# Patient Record
Sex: Female | Born: 1935 | Race: White | Hispanic: No | Marital: Married | Smoking: Never smoker
Health system: Southern US, Community
[De-identification: ages and names within clinical notes are randomized; demographics above are authoritative.]

## PROBLEM LIST (undated history)

## (undated) DIAGNOSIS — K635 Polyp of colon: Secondary | ICD-10-CM

## (undated) DIAGNOSIS — E13319 Other specified diabetes mellitus with unspecified diabetic retinopathy without macular edema: Secondary | ICD-10-CM

## (undated) DIAGNOSIS — E785 Hyperlipidemia, unspecified: Secondary | ICD-10-CM

## (undated) DIAGNOSIS — E049 Nontoxic goiter, unspecified: Secondary | ICD-10-CM

## (undated) DIAGNOSIS — D649 Anemia, unspecified: Secondary | ICD-10-CM

## (undated) DIAGNOSIS — N200 Calculus of kidney: Secondary | ICD-10-CM

## (undated) DIAGNOSIS — E669 Obesity, unspecified: Secondary | ICD-10-CM

## (undated) DIAGNOSIS — J189 Pneumonia, unspecified organism: Secondary | ICD-10-CM

## (undated) DIAGNOSIS — R0602 Shortness of breath: Secondary | ICD-10-CM

## (undated) DIAGNOSIS — L02219 Cutaneous abscess of trunk, unspecified: Secondary | ICD-10-CM

## (undated) DIAGNOSIS — S31109A Unspecified open wound of abdominal wall, unspecified quadrant without penetration into peritoneal cavity, initial encounter: Secondary | ICD-10-CM

## (undated) DIAGNOSIS — K219 Gastro-esophageal reflux disease without esophagitis: Secondary | ICD-10-CM

## (undated) DIAGNOSIS — D3500 Benign neoplasm of unspecified adrenal gland: Secondary | ICD-10-CM

## (undated) DIAGNOSIS — K469 Unspecified abdominal hernia without obstruction or gangrene: Secondary | ICD-10-CM

## (undated) DIAGNOSIS — M109 Gout, unspecified: Secondary | ICD-10-CM

## (undated) DIAGNOSIS — L03319 Cellulitis of trunk, unspecified: Secondary | ICD-10-CM

## (undated) DIAGNOSIS — E1321 Other specified diabetes mellitus with diabetic nephropathy: Secondary | ICD-10-CM

## (undated) DIAGNOSIS — M1990 Unspecified osteoarthritis, unspecified site: Secondary | ICD-10-CM

## (undated) DIAGNOSIS — I10 Essential (primary) hypertension: Secondary | ICD-10-CM

## (undated) DIAGNOSIS — E119 Type 2 diabetes mellitus without complications: Secondary | ICD-10-CM

## (undated) DIAGNOSIS — K759 Inflammatory liver disease, unspecified: Secondary | ICD-10-CM

## (undated) DIAGNOSIS — K5792 Diverticulitis of intestine, part unspecified, without perforation or abscess without bleeding: Secondary | ICD-10-CM

## (undated) HISTORY — DX: Nontoxic goiter, unspecified: E04.9

## (undated) HISTORY — DX: Other specified diabetes mellitus with diabetic nephropathy: E13.21

## (undated) HISTORY — PX: TOTAL ABDOMINAL HYSTERECTOMY: SHX209

## (undated) HISTORY — DX: Gout, unspecified: M10.9

## (undated) HISTORY — PX: CHOLECYSTECTOMY: SHX55

## (undated) HISTORY — DX: Polyp of colon: K63.5

## (undated) HISTORY — PX: ROTATOR CUFF REPAIR: SHX139

## (undated) HISTORY — DX: Obesity, unspecified: E66.9

## (undated) HISTORY — DX: Anemia, unspecified: D64.9

## (undated) HISTORY — PX: TONSILLECTOMY: SUR1361

## (undated) HISTORY — DX: Cutaneous abscess of trunk, unspecified: L02.219

## (undated) HISTORY — PX: SIGMOID RESECTION / RECTOPEXY: SUR1294

## (undated) HISTORY — DX: Type 2 diabetes mellitus without complications: E11.9

## (undated) HISTORY — DX: Hyperlipidemia, unspecified: E78.5

## (undated) HISTORY — DX: Essential (primary) hypertension: I10

## (undated) HISTORY — PX: APPENDECTOMY: SHX54

## (undated) HISTORY — DX: Benign neoplasm of unspecified adrenal gland: D35.00

## (undated) HISTORY — DX: Other specified diabetes mellitus with unspecified diabetic retinopathy without macular edema: E13.319

## (undated) HISTORY — DX: Unspecified open wound of abdominal wall, unspecified quadrant without penetration into peritoneal cavity, initial encounter: S31.109A

## (undated) HISTORY — DX: Calculus of kidney: N20.0

## (undated) HISTORY — DX: Unspecified abdominal hernia without obstruction or gangrene: K46.9

## (undated) HISTORY — DX: Diverticulitis of intestine, part unspecified, without perforation or abscess without bleeding: K57.92

## (undated) HISTORY — DX: Cellulitis of trunk, unspecified: L03.319

---

## 1997-05-12 ENCOUNTER — Encounter: Admission: RE | Admit: 1997-05-12 | Discharge: 1997-08-10 | Payer: Self-pay | Admitting: Endocrinology

## 1998-03-30 ENCOUNTER — Emergency Department (HOSPITAL_COMMUNITY): Admission: EM | Admit: 1998-03-30 | Discharge: 1998-03-30 | Payer: Self-pay | Admitting: Emergency Medicine

## 1998-03-30 ENCOUNTER — Encounter: Payer: Self-pay | Admitting: Emergency Medicine

## 1999-02-05 ENCOUNTER — Inpatient Hospital Stay (HOSPITAL_COMMUNITY): Admission: EM | Admit: 1999-02-05 | Discharge: 1999-02-11 | Payer: Self-pay | Admitting: Emergency Medicine

## 1999-02-07 ENCOUNTER — Encounter: Payer: Self-pay | Admitting: Internal Medicine

## 1999-02-09 ENCOUNTER — Encounter: Payer: Self-pay | Admitting: Internal Medicine

## 1999-02-28 ENCOUNTER — Observation Stay (HOSPITAL_COMMUNITY): Admission: RE | Admit: 1999-02-28 | Discharge: 1999-03-01 | Payer: Self-pay

## 1999-02-28 ENCOUNTER — Encounter (INDEPENDENT_AMBULATORY_CARE_PROVIDER_SITE_OTHER): Payer: Self-pay

## 1999-05-29 ENCOUNTER — Emergency Department (HOSPITAL_COMMUNITY): Admission: EM | Admit: 1999-05-29 | Discharge: 1999-05-29 | Payer: Self-pay | Admitting: Emergency Medicine

## 1999-05-29 ENCOUNTER — Encounter: Payer: Self-pay | Admitting: Emergency Medicine

## 1999-05-30 ENCOUNTER — Encounter: Payer: Self-pay | Admitting: Emergency Medicine

## 1999-12-26 ENCOUNTER — Ambulatory Visit (HOSPITAL_COMMUNITY): Admission: RE | Admit: 1999-12-26 | Discharge: 1999-12-26 | Payer: Self-pay | Admitting: Family Medicine

## 2000-01-18 ENCOUNTER — Encounter: Admission: RE | Admit: 2000-01-18 | Discharge: 2000-01-18 | Payer: Self-pay | Admitting: Orthopaedic Surgery

## 2000-01-18 ENCOUNTER — Encounter: Payer: Self-pay | Admitting: Orthopaedic Surgery

## 2000-03-21 ENCOUNTER — Encounter: Payer: Self-pay | Admitting: Orthopaedic Surgery

## 2000-03-21 ENCOUNTER — Encounter: Admission: RE | Admit: 2000-03-21 | Discharge: 2000-03-21 | Payer: Self-pay | Admitting: Orthopaedic Surgery

## 2000-12-22 ENCOUNTER — Emergency Department (HOSPITAL_COMMUNITY): Admission: EM | Admit: 2000-12-22 | Discharge: 2000-12-22 | Payer: Self-pay

## 2001-02-04 ENCOUNTER — Encounter: Payer: Self-pay | Admitting: Internal Medicine

## 2001-02-04 ENCOUNTER — Encounter: Admission: RE | Admit: 2001-02-04 | Discharge: 2001-02-04 | Payer: Self-pay | Admitting: Internal Medicine

## 2001-02-07 ENCOUNTER — Encounter: Payer: Self-pay | Admitting: Internal Medicine

## 2001-02-07 ENCOUNTER — Ambulatory Visit (HOSPITAL_COMMUNITY): Admission: RE | Admit: 2001-02-07 | Discharge: 2001-02-07 | Payer: Self-pay | Admitting: Internal Medicine

## 2001-02-07 DIAGNOSIS — K210 Gastro-esophageal reflux disease with esophagitis: Secondary | ICD-10-CM

## 2001-02-07 DIAGNOSIS — K2900 Acute gastritis without bleeding: Secondary | ICD-10-CM | POA: Insufficient documentation

## 2001-09-16 ENCOUNTER — Encounter: Payer: Self-pay | Admitting: Emergency Medicine

## 2001-09-16 ENCOUNTER — Emergency Department (HOSPITAL_COMMUNITY): Admission: EM | Admit: 2001-09-16 | Discharge: 2001-09-16 | Payer: Self-pay | Admitting: Emergency Medicine

## 2001-09-18 ENCOUNTER — Encounter: Payer: Self-pay | Admitting: Urology

## 2001-09-18 ENCOUNTER — Ambulatory Visit (HOSPITAL_COMMUNITY): Admission: RE | Admit: 2001-09-18 | Discharge: 2001-09-18 | Payer: Self-pay | Admitting: Urology

## 2001-12-17 ENCOUNTER — Other Ambulatory Visit: Admission: RE | Admit: 2001-12-17 | Discharge: 2001-12-17 | Payer: Self-pay | Admitting: Obstetrics and Gynecology

## 2002-01-28 ENCOUNTER — Encounter (INDEPENDENT_AMBULATORY_CARE_PROVIDER_SITE_OTHER): Payer: Self-pay | Admitting: Specialist

## 2002-01-28 ENCOUNTER — Observation Stay (HOSPITAL_COMMUNITY): Admission: RE | Admit: 2002-01-28 | Discharge: 2002-01-29 | Payer: Self-pay | Admitting: Obstetrics and Gynecology

## 2002-02-05 ENCOUNTER — Emergency Department (HOSPITAL_COMMUNITY): Admission: EM | Admit: 2002-02-05 | Discharge: 2002-02-05 | Payer: Self-pay | Admitting: Emergency Medicine

## 2002-02-05 ENCOUNTER — Encounter: Payer: Self-pay | Admitting: Emergency Medicine

## 2002-03-12 ENCOUNTER — Encounter: Payer: Self-pay | Admitting: Obstetrics and Gynecology

## 2002-03-12 ENCOUNTER — Encounter: Admission: RE | Admit: 2002-03-12 | Discharge: 2002-03-12 | Payer: Self-pay | Admitting: Obstetrics and Gynecology

## 2002-12-02 ENCOUNTER — Emergency Department (HOSPITAL_COMMUNITY): Admission: EM | Admit: 2002-12-02 | Discharge: 2002-12-02 | Payer: Self-pay | Admitting: Emergency Medicine

## 2002-12-19 ENCOUNTER — Encounter: Admission: RE | Admit: 2002-12-19 | Discharge: 2002-12-19 | Payer: Self-pay | Admitting: Endocrinology

## 2003-01-16 ENCOUNTER — Inpatient Hospital Stay (HOSPITAL_BASED_OUTPATIENT_CLINIC_OR_DEPARTMENT_OTHER): Admission: RE | Admit: 2003-01-16 | Discharge: 2003-01-16 | Payer: Self-pay | Admitting: Cardiovascular Disease

## 2003-01-16 ENCOUNTER — Encounter: Payer: Self-pay | Admitting: Pulmonary Disease

## 2005-01-09 HISTORY — PX: VENTRAL HERNIA REPAIR: SHX424

## 2005-02-20 ENCOUNTER — Encounter: Admission: RE | Admit: 2005-02-20 | Discharge: 2005-02-20 | Payer: Self-pay | Admitting: Endocrinology

## 2005-07-11 ENCOUNTER — Encounter: Admission: RE | Admit: 2005-07-11 | Discharge: 2005-07-11 | Payer: Self-pay | Admitting: Endocrinology

## 2005-12-02 ENCOUNTER — Inpatient Hospital Stay (HOSPITAL_COMMUNITY): Admission: EM | Admit: 2005-12-02 | Discharge: 2005-12-07 | Payer: Self-pay | Admitting: Emergency Medicine

## 2006-01-09 HISTORY — PX: CARDIAC CATHETERIZATION: SHX172

## 2006-05-14 ENCOUNTER — Encounter: Admission: RE | Admit: 2006-05-14 | Discharge: 2006-05-14 | Payer: Self-pay | Admitting: General Surgery

## 2006-06-14 ENCOUNTER — Ambulatory Visit (HOSPITAL_COMMUNITY): Admission: RE | Admit: 2006-06-14 | Discharge: 2006-06-14 | Payer: Self-pay | Admitting: General Surgery

## 2006-08-28 ENCOUNTER — Ambulatory Visit: Payer: Self-pay | Admitting: Internal Medicine

## 2006-10-04 ENCOUNTER — Ambulatory Visit: Payer: Self-pay | Admitting: Internal Medicine

## 2006-10-04 DIAGNOSIS — D126 Benign neoplasm of colon, unspecified: Secondary | ICD-10-CM

## 2006-10-04 DIAGNOSIS — K5730 Diverticulosis of large intestine without perforation or abscess without bleeding: Secondary | ICD-10-CM | POA: Insufficient documentation

## 2006-10-04 HISTORY — PX: COLONOSCOPY: SHX174

## 2006-12-25 ENCOUNTER — Ambulatory Visit (HOSPITAL_COMMUNITY): Admission: RE | Admit: 2006-12-25 | Discharge: 2006-12-25 | Payer: Self-pay | Admitting: General Surgery

## 2007-01-25 ENCOUNTER — Encounter (HOSPITAL_BASED_OUTPATIENT_CLINIC_OR_DEPARTMENT_OTHER): Admission: RE | Admit: 2007-01-25 | Discharge: 2007-04-25 | Payer: Self-pay | Admitting: Surgery

## 2007-04-15 DIAGNOSIS — I10 Essential (primary) hypertension: Secondary | ICD-10-CM

## 2007-04-15 DIAGNOSIS — E669 Obesity, unspecified: Secondary | ICD-10-CM | POA: Insufficient documentation

## 2007-04-15 DIAGNOSIS — M1990 Unspecified osteoarthritis, unspecified site: Secondary | ICD-10-CM | POA: Insufficient documentation

## 2007-04-15 DIAGNOSIS — E1122 Type 2 diabetes mellitus with diabetic chronic kidney disease: Secondary | ICD-10-CM

## 2007-04-15 DIAGNOSIS — E1169 Type 2 diabetes mellitus with other specified complication: Secondary | ICD-10-CM | POA: Insufficient documentation

## 2007-04-15 DIAGNOSIS — F329 Major depressive disorder, single episode, unspecified: Secondary | ICD-10-CM

## 2007-04-15 DIAGNOSIS — E785 Hyperlipidemia, unspecified: Secondary | ICD-10-CM

## 2007-04-15 DIAGNOSIS — K219 Gastro-esophageal reflux disease without esophagitis: Secondary | ICD-10-CM | POA: Insufficient documentation

## 2007-04-15 DIAGNOSIS — Z8711 Personal history of peptic ulcer disease: Secondary | ICD-10-CM

## 2007-04-15 DIAGNOSIS — D539 Nutritional anemia, unspecified: Secondary | ICD-10-CM | POA: Insufficient documentation

## 2007-04-15 DIAGNOSIS — F411 Generalized anxiety disorder: Secondary | ICD-10-CM | POA: Insufficient documentation

## 2007-07-11 ENCOUNTER — Encounter (HOSPITAL_BASED_OUTPATIENT_CLINIC_OR_DEPARTMENT_OTHER): Admission: RE | Admit: 2007-07-11 | Discharge: 2007-07-30 | Payer: Self-pay | Admitting: Internal Medicine

## 2008-01-09 ENCOUNTER — Ambulatory Visit: Payer: Self-pay | Admitting: Internal Medicine

## 2008-01-21 ENCOUNTER — Encounter: Payer: Self-pay | Admitting: Internal Medicine

## 2008-01-21 LAB — CBC & DIFF AND RETIC
Basophils Absolute: 0 10*3/uL (ref 0.0–0.1)
EOS%: 2.3 % (ref 0.0–7.0)
Eosinophils Absolute: 0.1 10*3/uL (ref 0.0–0.5)
HCT: 30 % — ABNORMAL LOW (ref 34.8–46.6)
HGB: 9.6 g/dL — ABNORMAL LOW (ref 11.6–15.9)
IRF: 0.39 — ABNORMAL HIGH (ref 0.130–0.330)
MCH: 20.7 pg — ABNORMAL LOW (ref 26.0–34.0)
MCV: 64.4 fL — ABNORMAL LOW (ref 81.0–101.0)
MONO%: 10.3 % (ref 0.0–13.0)
NEUT#: 2.8 10*3/uL (ref 1.5–6.5)
NEUT%: 59.3 % (ref 39.6–76.8)
RETIC #: 93.5 10*3/uL (ref 19.7–115.1)

## 2008-01-23 LAB — COMPREHENSIVE METABOLIC PANEL
ALT: 21 U/L (ref 0–35)
AST: 28 U/L (ref 0–37)
Albumin: 4.1 g/dL (ref 3.5–5.2)
BUN: 29 mg/dL — ABNORMAL HIGH (ref 6–23)
CO2: 25 mEq/L (ref 19–32)
Calcium: 9.2 mg/dL (ref 8.4–10.5)
Chloride: 105 mEq/L (ref 96–112)
Creatinine, Ser: 1.07 mg/dL (ref 0.40–1.20)
Potassium: 4.7 mEq/L (ref 3.5–5.3)

## 2008-01-23 LAB — PROTEIN ELECTROPHORESIS, SERUM
Alpha-2-Globulin: 9 % (ref 7.1–11.8)
Beta Globulin: 7.8 % — ABNORMAL HIGH (ref 4.7–7.2)
Gamma Globulin: 19.3 % — ABNORMAL HIGH (ref 11.1–18.8)
Total Protein, Serum Electrophoresis: 7.6 g/dL (ref 6.0–8.3)

## 2008-01-23 LAB — HEMOGLOBINOPATHY EVALUATION
Hemoglobin Other: 0 % (ref 0.0–0.0)
Hgb A2 Quant: 5.2 % — ABNORMAL HIGH (ref 2.2–3.2)
Hgb A: 91.4 % — ABNORMAL LOW (ref 96.8–97.8)
Hgb F Quant: 3.4 % — ABNORMAL HIGH (ref 0.0–2.0)
Hgb S Quant: 0 % (ref 0.0–0.0)

## 2008-01-23 LAB — LACTATE DEHYDROGENASE: LDH: 143 U/L (ref 94–250)

## 2008-01-23 LAB — IRON AND TIBC
%SAT: 19 % — ABNORMAL LOW (ref 20–55)
Iron: 95 ug/dL (ref 42–145)

## 2008-02-11 ENCOUNTER — Encounter: Payer: Self-pay | Admitting: Internal Medicine

## 2008-02-11 LAB — CBC WITH DIFFERENTIAL/PLATELET
BASO%: 0.1 % (ref 0.0–2.0)
Basophils Absolute: 0 10*3/uL (ref 0.0–0.1)
EOS%: 2.2 % (ref 0.0–7.0)
Eosinophils Absolute: 0.1 10*3/uL (ref 0.0–0.5)
HCT: 28.1 % — ABNORMAL LOW (ref 34.8–46.6)
HGB: 9 g/dL — ABNORMAL LOW (ref 11.6–15.9)
LYMPH%: 28.6 % (ref 14.0–48.0)
MCH: 20.5 pg — ABNORMAL LOW (ref 26.0–34.0)
MCHC: 32.2 g/dL (ref 32.0–36.0)
MCV: 63.6 fL — ABNORMAL LOW (ref 81.0–101.0)
MONO#: 0.4 10*3/uL (ref 0.1–0.9)
MONO%: 7.4 % (ref 0.0–13.0)
NEUT#: 3.3 10*3/uL (ref 1.5–6.5)
NEUT%: 61.7 % (ref 39.6–76.8)
Platelets: 209 10*3/uL (ref 145–400)
RBC: 4.42 10*6/uL (ref 3.70–5.32)
RDW: 16.8 % — ABNORMAL HIGH (ref 11.3–14.5)
WBC: 5.3 10*3/uL (ref 3.9–10.0)
lymph#: 1.5 10*3/uL (ref 0.9–3.3)

## 2009-05-03 ENCOUNTER — Encounter: Admission: RE | Admit: 2009-05-03 | Discharge: 2009-05-03 | Payer: Self-pay | Admitting: General Surgery

## 2009-06-01 ENCOUNTER — Ambulatory Visit (HOSPITAL_COMMUNITY): Admission: RE | Admit: 2009-06-01 | Discharge: 2009-06-01 | Payer: Self-pay | Admitting: General Surgery

## 2009-06-29 ENCOUNTER — Encounter: Payer: Self-pay | Admitting: Pulmonary Disease

## 2009-10-04 ENCOUNTER — Ambulatory Visit: Payer: Self-pay | Admitting: Pulmonary Disease

## 2009-10-04 DIAGNOSIS — R0989 Other specified symptoms and signs involving the circulatory and respiratory systems: Secondary | ICD-10-CM

## 2009-10-04 DIAGNOSIS — R0609 Other forms of dyspnea: Secondary | ICD-10-CM

## 2009-10-18 ENCOUNTER — Ambulatory Visit: Payer: Self-pay | Admitting: Internal Medicine

## 2009-10-22 ENCOUNTER — Ambulatory Visit: Payer: Self-pay | Admitting: Pulmonary Disease

## 2009-10-26 ENCOUNTER — Encounter: Payer: Self-pay | Admitting: Internal Medicine

## 2009-10-26 LAB — COMPREHENSIVE METABOLIC PANEL
ALT: 27 U/L (ref 0–35)
AST: 34 U/L (ref 0–37)
Albumin: 3.9 g/dL (ref 3.5–5.2)
Alkaline Phosphatase: 43 U/L (ref 39–117)
BUN: 32 mg/dL — ABNORMAL HIGH (ref 6–23)
CO2: 25 mEq/L (ref 19–32)
Calcium: 9.8 mg/dL (ref 8.4–10.5)
Chloride: 105 mEq/L (ref 96–112)
Creatinine, Ser: 1.17 mg/dL (ref 0.40–1.20)
Glucose, Bld: 231 mg/dL — ABNORMAL HIGH (ref 70–99)
Potassium: 4.6 mEq/L (ref 3.5–5.3)
Sodium: 139 mEq/L (ref 135–145)
Total Bilirubin: 0.8 mg/dL (ref 0.3–1.2)
Total Protein: 7.9 g/dL (ref 6.0–8.3)

## 2009-10-26 LAB — CBC WITH DIFFERENTIAL/PLATELET
BASO%: 0.4 % (ref 0.0–2.0)
Basophils Absolute: 0 10*3/uL (ref 0.0–0.1)
EOS%: 1.2 % (ref 0.0–7.0)
Eosinophils Absolute: 0.1 10*3/uL (ref 0.0–0.5)
HCT: 28 % — ABNORMAL LOW (ref 34.8–46.6)
HGB: 9.1 g/dL — ABNORMAL LOW (ref 11.6–15.9)
LYMPH%: 30.1 % (ref 14.0–49.7)
MCH: 21.7 pg — ABNORMAL LOW (ref 25.1–34.0)
MCHC: 32.7 g/dL (ref 31.5–36.0)
MCV: 66.3 fL — ABNORMAL LOW (ref 79.5–101.0)
MONO#: 0.4 10*3/uL (ref 0.1–0.9)
MONO%: 8.2 % (ref 0.0–14.0)
NEUT#: 3 10*3/uL (ref 1.5–6.5)
NEUT%: 60.1 % (ref 38.4–76.8)
Platelets: 222 10*3/uL (ref 145–400)
RBC: 4.22 10*6/uL (ref 3.70–5.45)
RDW: 17.9 % — ABNORMAL HIGH (ref 11.2–14.5)
WBC: 5.1 10*3/uL (ref 3.9–10.3)
lymph#: 1.5 10*3/uL (ref 0.9–3.3)

## 2009-10-27 ENCOUNTER — Ambulatory Visit (HOSPITAL_COMMUNITY): Admission: RE | Admit: 2009-10-27 | Discharge: 2009-10-27 | Payer: Self-pay | Admitting: Pulmonary Disease

## 2010-02-08 NOTE — Miscellaneous (Signed)
Summary: Orders Update pft charges  Clinical Lists Changes  Orders: Added new Service order of Carbon Monoxide diffusing w/capacity (94720) - Signed Added new Service order of Lung Volumes (94240) - Signed Added new Service order of Spirometry (Pre & Post) (94060) - Signed 

## 2010-02-08 NOTE — Assessment & Plan Note (Signed)
Summary: consult for Lauren Crosby   Visit Type:  Initial Consult Copy to:  Adrian Prince MD  CC:  pulmonary consult. Marland Kitchen  History of Present Illness: The pt is a 77y/o female who I have been asked to see for dyspnea on exertion.  The pt states that she has had sob for over one year, but feels it is getting worse.  It has gotten to the point that she can get winded at times just walking thru her house.  She is able to bring in groceries from the car with one trip, but gets sob just making her bed with the assistance of her husband.  She denies cough, congestion, or mucus.  She has had significant LE edema, and notes that her LLE stays swollen.  She has no h/o prior dvt.  She has had an echo in July which showed normal LV function, +diastolic dysfunction, and a poor acoustic window to assess RV.  PA pressures were not commented on.  She had a cath in 2005 with clean coronaries.  The pt does have a h/o anemia, and her most recent Hgb was 9.4.  Her family member states this has been as low as 7 in the past.  The pt has never smoked, and no h/o asthma as a child.  She has significant debility related to her arthritic knees, and tells me that she needs to have bilat TKR.  She states that her weight is neutral over the last one year.  The pt doesn't think she has had a recent cxr, but the note from Dr.South mentions getting one.  Preventive Screening-Counseling & Management  Alcohol-Tobacco     Smoking Status: never  Allergies (verified): 1)  ! Pcn  Past History:  Past Medical History:  DEPRESSION (ICD-311) ANXIETY (ICD-300.00) DEGENERATIVE JOINT DISEASE (ICD-715.90) OBESITY (ICD-278.00) HELICOBACTER PYLORI INFECTION, HX OF (ICD-V12.71) HYPERLIPIDEMIA (ICD-272.4) ANEMIA, CHRONIC (ICD-281.9) HYPERTENSION (ICD-401.9) DIABETES MELLITUS, TYPE II (ICD-250.00) GASTRITIS, ACUTE (ICD-535.00) REFLUX ESOPHAGITIS (ICD-530.11) GERD (ICD-530.81) COLONIC POLYPS (ICD-211.3) DIVERTICULOSIS, COLON  (ICD-562.10)  Past Surgical History: Segmental colectomy d/t diverticulitis appendectomy partial hysterectomy cholecystectomy tonsillectomy ventral hernia repair  Family History: Reviewed history and no changes required. hemochromatosis--mother kidney failure--father RA--SISTER HTN--sister  Social History: Reviewed history and no changes required. married lives with husband children--6 Occupation--retired--bank teller Patient never smoked.  Smoking Status:  never  Review of Systems       The patient complains of shortness of breath with activity, acid heartburn, itching, and hand/feet swelling.  The patient denies shortness of breath at rest, productive cough, non-productive cough, coughing up blood, chest pain, irregular heartbeats, indigestion, loss of appetite, weight change, abdominal pain, difficulty swallowing, sore throat, tooth/dental problems, headaches, nasal congestion/difficulty breathing through nose, sneezing, ear ache, anxiety, depression, joint stiffness or pain, rash, change in color of mucus, and fever.    Vital Signs:  Patient profile:   77 year old female Height:      62 inches Weight:      234.13 pounds BMI:     42.98 O2 Sat:      96 % on Room air Temp:     98.5 degrees F oral Pulse rate:   87 / minute BP sitting:   124 / 70  (left arm) Cuff size:   large  Vitals Entered By: Carver Fila (October 04, 2009 3:19 PM)  O2 Flow:  Room air CC: pulmonary consult.  Comments meds and allergies updated Phone number updated Carver Fila  October 04, 2009 3:25 PM  Physical Exam  General:  obese female in nad Eyes:  PERRLA and EOMI.   Nose:  patent without discharge Mouth:  no lesions or exudates noted Neck:  no jvd, tmg, LN Lungs:  clear to auscultation.  No wheezing or rhonchi poor depth of inspiration, prob. related to her centripetal obesity Heart:  rrr, no mrg Abdomen:  soft and nontender, bs+ Extremities:  2-3+ pitting edema  left>>right decreased pulses, no cyanosis  Neurologic:  alert and oriented,moves all 4.   Impression & Recommendations:  Problem # 1:  DYSPNEA ON EXERTION (ICD-786.09) the pt more than likely has multifactorial dyspnea.  She is obese with a significant centripetal distribution, has arthritic kinees with support issues while ambulating, has a h/o anemia (most recent check 9.4), has diastolic dysfunction by her most recent echo with significant LE edema, and finally it is unclear if she has a pulmonary issue or not.  She has no h/o pulmonary disease, and her lung fields are clear today.  Other etiologies to consider are chronic thromboembolic disease with her ongoing LE edema, ?pulmonary htn (the echo had a poor acoustic window and there is no mention of estimated PA pressures or RV function), and finally ischemic heart disease given her known DM and her last cath being 35yrs ago.  Will start with full pfts, and see her back to discuss results.  Medications Added to Medication List This Visit: 1)  Centrum Silver Tabs (Multiple vitamins-minerals) .... Once daily 2)  Humulin 70/30 70-30 % Susp (Insulin isophane & regular) .... 30 units in the am and 50 units in the pm 3)  Metformin Hcl 1000 Mg Tabs (Metformin hcl) .Marland Kitchen.. 1 tablet two times a day 4)  Losartan Potassium 100 Mg Tabs (Losartan potassium) .... Once daily 5)  Simvastatin 80 Mg Tabs (Simvastatin) .... Once daily  Other Orders: Consultation Level IV (16109) Pulmonary Referral (Pulmonary)  Patient Instructions: 1)  will schedule for breathing tests, and see you back same day to discuss results. 2)  If no recent cxr from Dr Rinaldo Cloud office, will check one next visit here.

## 2010-02-08 NOTE — Assessment & Plan Note (Signed)
Summary: rov to review pfts.   Visit Type:  Follow-up Copy to:  Adrian Prince MD  CC:  pt here to discuss pft's. pt states breathing is the same from last visit. Pt is never smoker. Marland Kitchen  History of Present Illness: The pt comes in today for f/u of her pfts, ordered as part of a w/u for doe.  She was found to have no obstruction, no restriction, and a moderate decrease in DLCO which corrected for alveolar volume adjustment.  The pt also has a h/o anemia, and her DLCO was not corrected for Hgb.  I have reviewed her study in detail with her, and answered all of her questions.  Current Medications (verified): 1)  Centrum Silver  Tabs (Multiple Vitamins-Minerals) .... Once Daily 2)  Humulin 70/30 70-30 % Susp (Insulin Isophane & Regular) .... 30 Units in The Am and 50 Units in The Pm 3)  Metformin Hcl 1000 Mg Tabs (Metformin Hcl) .Marland Kitchen.. 1 Tablet Two Times A Day 4)  Losartan Potassium 100 Mg Tabs (Losartan Potassium) .... Once Daily 5)  Simvastatin 80 Mg Tabs (Simvastatin) .... Once Daily 6)  Fenofibrate 160 Mg Tabs (Fenofibrate) .... Once Daily  Allergies (verified): 1)  ! Pcn  Review of Systems       The patient complains of shortness of breath with activity, non-productive cough, chest pain, and hand/feet swelling.  The patient denies shortness of breath at rest, productive cough, coughing up blood, irregular heartbeats, acid heartburn, indigestion, loss of appetite, weight change, abdominal pain, difficulty swallowing, sore throat, tooth/dental problems, headaches, nasal congestion/difficulty breathing through nose, sneezing, itching, ear ache, anxiety, depression, joint stiffness or pain, rash, change in color of mucus, and fever.    Vital Signs:  Patient profile:   77 year old female Height:      62 inches Weight:      228 pounds BMI:     41.85 O2 Sat:      95 % on Room air Temp:     97.8 degrees F oral Pulse rate:   85 / minute BP sitting:   130 / 66  (left arm) Cuff size:    large  Vitals Entered By: Carver Fila (October 22, 2009 10:08 AM)  O2 Flow:  Room air CC: pt here to discuss pft's. pt states breathing is the same from last visit. Pt is never smoker.  Comments meds and allergies updated Phone number updated Carver Fila  October 22, 2009 10:09 AM    Physical Exam  General:  obese female in nad Nose:  no obvious discharge or purulence. Extremities:  2+ edema bilat, no cyanosis  Neurologic:  alert and oriented, moves all 4.   Impression & Recommendations:  Problem # 1:  DYSPNEA ON EXERTION (ICD-786.09) the pt has no obstruction or restriction on her pfts, and her moderately decreased DLCO may be due to obesity and her anemia.  However, chronic thromboembolic disease and cardiac dysfunction can also cause a decreased dlco.  Would like to do v/q scan to r/o chronic VTE since she is definitely high risk for this, but if negative, no further pulmonary w/u is required.  I will leave to her primary MD whether to pursue further cardiac evaluation.    Medications Added to Medication List This Visit: 1)  Fenofibrate 160 Mg Tabs (Fenofibrate) .... Once daily  Other Orders: Est. Patient Level III (78469) Radiology Referral (Radiology)  Patient Instructions: 1)  will check lung scan to make sure you do not have small  blood clots in lung  If that test is normal, would do no further pulmonary testing. 2)  work on weight loss and conditioning  3)  will send a note to Dr. Evlyn Kanner outlining my recommendations.   Immunization History:  Pneumovax Immunization History:    Pneumovax:  historical (09/09/2009)   Appended Document: rov to review pfts. we have received radiology report and put into your very important look at folder

## 2010-02-08 NOTE — Letter (Signed)
Summary: [facility]  [facility]   Imported By: Sherian Rein 11/08/2009 12:00:56  _____________________________________________________________________  External Attachment:    Type:   Image     Comment:   External Document

## 2010-02-08 NOTE — Cardiovascular Report (Signed)
Summary: [facility]   Imported By: Sherian Rein 10/11/2009 08:24:05  _____________________________________________________________________  External Attachment:    Type:   Image     Comment:   External Document

## 2010-03-28 LAB — ANAEROBIC CULTURE: Gram Stain: NONE SEEN

## 2010-03-28 LAB — URINALYSIS, ROUTINE W REFLEX MICROSCOPIC
Bilirubin Urine: NEGATIVE
Glucose, UA: 500 mg/dL — AB
Hgb urine dipstick: NEGATIVE
Ketones, ur: NEGATIVE mg/dL
Nitrite: NEGATIVE
Protein, ur: NEGATIVE mg/dL
Specific Gravity, Urine: 1.018 (ref 1.005–1.030)
Urobilinogen, UA: 0.2 mg/dL (ref 0.0–1.0)
pH: 6 (ref 5.0–8.0)

## 2010-03-28 LAB — TISSUE CULTURE: Gram Stain: NONE SEEN

## 2010-03-28 LAB — COMPREHENSIVE METABOLIC PANEL
ALT: 22 U/L (ref 0–35)
AST: 28 U/L (ref 0–37)
Albumin: 3.9 g/dL (ref 3.5–5.2)
Alkaline Phosphatase: 49 U/L (ref 39–117)
BUN: 33 mg/dL — ABNORMAL HIGH (ref 6–23)
CO2: 25 mEq/L (ref 19–32)
Calcium: 9.6 mg/dL (ref 8.4–10.5)
Chloride: 104 mEq/L (ref 96–112)
Creatinine, Ser: 1.07 mg/dL (ref 0.4–1.2)
GFR calc Af Amer: >60 mL/min (ref >60)
GFR calc non Af Amer: 50 mL/min — ABNORMAL LOW (ref >60)
Glucose, Bld: 301 mg/dL — ABNORMAL HIGH (ref 70–99)
Potassium: 4.4 mEq/L (ref 3.5–5.1)
Sodium: 140 mEq/L (ref 135–145)
Total Bilirubin: 0.8 mg/dL (ref 0.3–1.2)
Total Protein: 8 g/dL (ref 6.0–8.3)

## 2010-03-28 LAB — CBC
HCT: 29.6 % — ABNORMAL LOW (ref 36.0–46.0)
Hemoglobin: 9.3 g/dL — ABNORMAL LOW (ref 12.0–15.0)
MCHC: 31.5 g/dL (ref 30.0–36.0)
MCV: 66.1 fL — ABNORMAL LOW (ref 78.0–100.0)
Platelets: 229 10*3/uL (ref 150–400)
RBC: 4.48 MIL/uL (ref 3.87–5.11)
RDW: 16.9 % — ABNORMAL HIGH (ref 11.5–15.5)
WBC: 4.8 10*3/uL (ref 4.0–10.5)

## 2010-03-28 LAB — BODY FLUID CULTURE

## 2010-03-28 LAB — NO BLOOD PRODUCTS

## 2010-03-28 LAB — DIFFERENTIAL
Basophils Absolute: 0 10*3/uL (ref 0.0–0.1)
Basophils Relative: 0 % (ref 0–1)
Eosinophils Absolute: 0.1 10*3/uL (ref 0.0–0.7)
Eosinophils Relative: 1 % (ref 0–5)
Lymphocytes Relative: 27 % (ref 12–46)
Lymphs Abs: 1.3 10*3/uL (ref 0.7–4.0)
Monocytes Absolute: 0.3 10*3/uL (ref 0.1–1.0)
Monocytes Relative: 7 % (ref 3–12)
Neutro Abs: 3.1 10*3/uL (ref 1.7–7.7)
Neutrophils Relative %: 64 % (ref 43–77)

## 2010-03-28 LAB — URINE MICROSCOPIC-ADD ON

## 2010-03-28 LAB — GLUCOSE, CAPILLARY: Glucose-Capillary: 170 mg/dL — ABNORMAL HIGH (ref 70–99)

## 2010-05-24 NOTE — Op Note (Signed)
Lauren Crosby, Lauren Crosby NO.:  [id number]   MEDICAL RECORD NO.:  [id number]          PATIENT TYPE:  AMB   LOCATION:  SDS                          FACILITY:  [facility]   PHYSICIAN:  Ollen Gross. Vernell Morgans, M.D. DATE OF BIRTH:  Mar 12, 1931   DATE OF PROCEDURE:  06/14/2006  DATE OF DISCHARGE:                               OPERATIVE REPORT   PREOPERATIVE DIAGNOSIS:  Abdominal wall abscess.   POSTOPERATIVE DIAGNOSIS:  Abdominal wall abscess.   PROCEDURE PERFORMED:  I-and-D of abdominal wall abscess.   SURGEON:  Ollen Gross. Vernell Morgans, M.D.   ANESTHESIA:  General endotracheal.   DESCRIPTION OF PROCEDURE:  After informed consent was obtained, the  patient was brought to the operating room and placed in the supine  position on the operating room table.  After the adequate induction of  general anesthesia, the patient's abdomen was prepped with Betadine and  draped in the usual sterile manner.  The patient had two abscesses --  one near her umbilicus, and the other superior and to the right of this.  These were probed with hemostats and both cavities connected deeply in  the subcutaneous tissues.  The tissue between the two openings was  opened sharply with the #15 blade knife until the cavity was completely  opened.  Hemostasis was achieved using the Bovie electrocautery.  The  granulation tissue was destroyed also with the electrocautery.  The  wounds at this point appeared to be clean.  No other tunneling could be  appreciated.   At this point the wound was then packed with a moistened Kerlix gauze  and sterile dressings were applied.  The patient tolerated the procedure  well.  At the end of the case, all needle, sponge and instrument counts  were correct.  The patient was then awakened and taken to the recovery  room in stable condition.      Ollen Gross. Vernell Morgans, M.D.  Electronically Signed     PST/MEDQ  D:  06/14/2006  T:  06/14/2006  Job:  [id number]

## 2010-05-24 NOTE — Assessment & Plan Note (Signed)
[facility]   NAME:  Lauren Crosby, SON NO.:  [id number]   MEDICAL RECORD NO.:  [id number]      DATE OF BIRTH:  1931/05/25   PHYSICIAN:  Theresia Majors. Tanda Rockers, M.D. VISIT DATE:  04/02/2007                                   OFFICE VISIT   SUBJECTIVE:  Lauren Crosby is a 77 year old lady who we have followed for  suture granuloma associated with a ventral hernia repair.  In the  interim, she has been irrigating the wound and continues with antiseptic  soap washes.  There has been no interim drainage, malodor, pain or  fever.   OBJECTIVE:  Blood pressure is 146/79, respirations 16, pulse rate 81,  temperature 97.9.  Capillary blood glucose is 145 mg percent.  Inspection of the previous  wound shows that there is complete re-epithelialization.  The sinus  tract is completely closed.  There is no evidence of periwound erythema,  fluctuance or tenderness.   ASSESSMENT:  Resolved suture granuloma.   PLAN:  The patient is discharged as an active patient from the [facility].  We have encouraged her to continue her followup and the  instructions per Dr. Carolynne Edouard and her surgeons.  She is to continue all of  her previously arranged followups with her internist.  We have advised  her against heavy lifting, pushing or pulling.  We have given her  husband and the patient an opportunity to ask questions.  They both seem  to understand and indicate that they will be compliant.  They express  gratitude for having been seen in the clinic.      Harold A. Tanda Rockers, M.D.  Electronically Signed     HAN/MEDQ  D:  04/02/2007  T:  04/02/2007  Job:  [id number]   cc:   Ollen Gross. Vernell Morgans, M.D.

## 2010-05-24 NOTE — Op Note (Signed)
NAMEBELLAH, ALIA NO.:  [id number]   MEDICAL RECORD NO.:  [id number]          PATIENT TYPE:  AMB   LOCATION:  SDS                          FACILITY:  [facility]   PHYSICIAN:  Ollen Gross. Vernell Morgans, M.D. DATE OF BIRTH:  Jul 21, 1931   DATE OF PROCEDURE:  06/14/2006  DATE OF DISCHARGE:                               OPERATIVE REPORT   ADDENDUM:  Once the wound was opened, several Prolene stitches were  identified in the middle of the cavity, and these were divided with the  suture scissors and removed from the wound.      Ollen Gross. Vernell Morgans, M.D.  Electronically Signed     PST/MEDQ  D:  06/14/2006  T:  06/14/2006  Job:  [id number]

## 2010-05-24 NOTE — Consult Note (Signed)
NAMEMONTINA, Lauren NO.:  [id number]   MEDICAL RECORD NO.:  [id number]          PATIENT TYPE:  REC   LOCATION:  FOOT                         FACILITY:  [facility]   PHYSICIAN:  Harold A. Tanda Rockers, M.D.DATE OF BIRTH:  02-02-1931   DATE OF CONSULTATION:  01/28/2007  DATE OF DISCHARGE:                                 CONSULTATION   SUBJECTIVE:  Lauren Crosby is a 77 year old female who has been under the  care of Dr. Carolynne Edouard for the past year for management of a wound involving  the anterior abdominal wall.  Historically, the patient has a long  history of diverticular disease and underwent a colectomy in 1973  without complications.  Apparently, she had an incarcerated incisional  hernia and presented in November of 2007.  She was taken to surgery and  underwent a resection of incarcerated bowel.  Since her surgery for the  strangulated hernia with perforation, she has had a persistent sinus.  There has been no fever.  The drainage has been serosanguineous.  No  particular malodor.  There has been no warmth to the abdominal wall.  There has been no excessive pain.  The patient's activity is not  hampered by the presence of the sinus.  Her diabetic management has been  stable and reasonable as per Dr. Evlyn Kanner.   Currently, her wound continues to drain.  It is being cleaned daily by  her husband.  There has been absolutely no fever or malodor.   PAST MEDICAL HISTORY:  Remarkable for hypertension, hypercholesteremia  and anemia.  She has been treated for diabetes for 30 years.   CURRENT MEDICATIONS:  1. Humulin 70/30, 30 units in the morning, 50 units in the evening.  2. Metformin 1 gram twice a day.  3. Benicar 40 mg daily.  4. Vytorin 10/20, 10 mg daily.  5. Poly-Iron 150 mg two daily.  6. She takes over-the-counter Centrum Silver.   ALLERGIES:  PENICILLIN causes her a severe rash.   PAST SURGERY:  1. Arthroscopy.  2. Abdominal hysterectomy.  3. Cholecystectomy.  4.  Sigmoid resection.  5. Ventral hernia repair for incarceration with bowel resection.  6. Appendectomy.  7. Rotator cuff repair.   FAMILY HISTORY:  Positive for diabetes, hypertension.  Negative for  stroke, heart attack and cancer.   SOCIAL HISTORY:  She is married.  She has children who live in the local  area.  She is retired.   REVIEW OF SYSTEMS:  She has never smoked.  Her hemoglobin A1c most  recently was 8.9.  She denies dyspnea on exertion.  There are no  transient visual field defects or paralyses.  Her bowel function is  normal.  She denies dysuria.  Her weight has been stable over the last  year, and her appetite is good.   PHYSICAL EXAMINATION:  GENERAL:  She is a pleasant female in no acute  distress.  She is accompanied by her husband.  VITAL SIGNS:  Blood pressure is 161/67, respirations 20, pulse rate 86,  temperature 98, capillary blood glucose 205.  HEENT:  Clear.  NECK:  Supple.  Trachea  is midline.  Thyroid nonpalpable.  LUNGS:  Clear.  HEART:  The heart sounds are distant.  ABDOMEN:  Soft.  There are well-healed midline incisions.  On the right  lateral portion of the midline incision in the supraumbilical position  is a sinus measuring 4 mm in transverse diameter oriented at the 2  o'clock position and extending 2.5 cm.  There is bloody drainage but no  tenderness.  A Q-tip was used to sound this area.  The wound was  irrigated with saline and packed with a quarter inch ribbon of iodoform  gauze.  There is bilateral 3+ pretibial edema.   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   PLAN:  We will continue to irrigate the wound following removal of the  iodoform new gauze in 48 hours.  We have explained the clinical approach  to the patient and her husband in terms that they seem to understand.  We have given them an opportunity to ask questions.  They seem to  appreciate the objectives, expressed gratitude for having  been seen in  the clinic and indicate that they will be compliant.  We specifically  advised the patient and her husband that they are to experience no pain,  no fever, no malodorous drainage.  If any of these should occur, they  are to call the clinic in the interim and we will see them priority.      Harold A. Tanda Rockers, M.D.  Electronically Signed     HAN/MEDQ  D:  01/28/2007  T:  01/28/2007  Job:  [id number]   cc:   Ollen Gross. Vernell Morgans, M.D.  Tera Mater. Evlyn Kanner, M.D.

## 2010-05-24 NOTE — Assessment & Plan Note (Signed)
[facility]   NAME:  Lauren Crosby, Lauren Crosby NO.:  [id number]   MEDICAL RECORD NO.:  [id number]      DATE OF BIRTH:  1931/11/20   PHYSICIAN:  Theresia Majors. Tanda Rockers, M.D. VISIT DATE:  03/01/2007                                   OFFICE VISIT   SUBJECTIVE:  Lauren Crosby is a 77 year old lady who we are following for  pseudo granuloma in a previous surgical incision.  She has had a drain  in the sinus for several months.  We have been treating her with daily  irrigations of saline with a ribbon packing.  She continues on  doxycycline and Septra for positive MRSA culture.  There has been no  interim fever.  There continues to be a bloody drainage.  There has been  no excessive pain.  She continues to be ambulatory.   OBJECTIVE:  Blood pressure is 132/73, respirations 16, pulse rate of 93,  temperature is 97.5.  Capillary blood glucose is 150 mg percent.  Inspection of the wound shows a persistence of the sinus but there is no  induration, hyperemia, fluctuans or tenderness.  A Q-tip was used to  sound the wound and there appears to be some decrease in volume but the  sinus track is extremely friable.  There was no expulsion or sensation  of a foreign body.  We irrigated the wound and placed a quarter inch  plain new gauze ribbon.  The husband will continue the daily irrigations  and the ribbon dressing.  We will re-evaluate the patient in one week.  We will give consideration to a curettage of the sinus track and  forwarding the contents for pathology.  In the interim the patient will  continue on antibiotics.  We will re-evaluate her in one week.      Harold A. Tanda Rockers, M.D.  Electronically Signed     HAN/MEDQ  D:  02/11/2007  T:  02/11/2007  Job:  [id number]

## 2010-05-24 NOTE — Assessment & Plan Note (Signed)
[facility]   NAME:  Lauren, Crosby NO.:  [id number]   MEDICAL RECORD NO.:  [id number]      DATE OF BIRTH:  28-Oct-1931   PHYSICIAN:  Theresia Majors. Tanda Rockers, M.D. VISIT DATE:  02/04/2007                                   OFFICE VISIT   SUBJECTIVE:  Lauren Crosby returns for follow-up of a suture granuloma in  a previous abdominal incision.  In the interim her husband has removed  an Iodoform packing after 48 hours and initiated the daily saline  irrigation.  There has been no excessive pain, drainage or malodor.  The  patient continues to be ambulatory and requires no pain medicine.   OBJECTIVE:  Blood pressure is 137/68, respirations are 20, pulse rate  98, temperature is 97.9, capillary blood glucose is 124 mg percent.  She  is accompanied by her husband.  Inspection of the abdominal incision  shows a persistent sinus.  Upon sounding with a Q-tip this wound is too  small to admit the Q-tip.  Using sterile forceps, however, the sinus was  sounded and it appears to be smaller.  There is some reactive bleeding,  there is no induration or evidence of infection.  A Mosquito hemostat  was placed into the wound, opened in each quadrant and there is no  evidence of a foreign body.  There was minimum reactive bleeding.  The  patient tolerated this well and was not particularly uncomfortable.  The  technique of saline irrigation of the sinus with a blunt needle was  demonstrated and a quarter inch plain gauze ribbon was placed into the  sinus.  The husband observed the dressing change and was allowed to ask  questions during the course.  Review of the culture shows MRSA sensitive  to tetracycline and sensitive to sulfur.   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   PLAN:  We have instructed the husband in daily wound irrigations and  packing  with a minimum ribbon strip.  Currently we will discharge the  patient for the husband to continue daily irrigations only.  The current  packing will remain in for 24-36 hours, after which he will removing and  begin an irrigation with saline using a blunt needle.  If the patient  notices any pain or redness, malodor, or requires any pain medication  other than regular Tylenol they are to call the clinic for an interim  evaluation.   We have also started the patient on doxycycline and Septra.  We have  given the patient and husband an opportunity to ask questions.  They  seem to understand the instruction and indicate that they will be  compliant.  They will call the office if they have any further questions  or encounter any difficulties as outlined above.      Harold A. Tanda Rockers, M.D.  Electronically Signed     HAN/MEDQ  D:  02/04/2007  T:  02/04/2007  Job:  [id number]

## 2010-05-24 NOTE — Assessment & Plan Note (Signed)
[facility]   NAME:  Lauren Crosby, Lauren Crosby NO.:  [id number]   MEDICAL RECORD NO.:  [id number]      DATE OF BIRTH:  March 22, 1931   PHYSICIAN:  Theresia Majors. Tanda Rockers, M.D. 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  We have been treating this wound with daily saline  irrigations and packing with quarter inch ribbon of plain new gauze.  There has been no increase in drainage.  There has been no fever or  excessive pain.  The patient continues to be ambulatory.  The wound is  irrigated and packed daily by her husband.   OBJECTIVE:  VITALS:  Blood pressure is 139/65, respirations 20, pulse  rate 80, temperature 98, capillary blood glucose 149 mg percent.  HEENT:  Inspection of the sinus shows that there is no periwound  induration or erythema.  There is no tenderness to palpation the wound  was sounded with a Q-tip and there is a tactically decrease in the  sinus.  There is no excessive inflammatory bleeding.   ASSESSMENT:  Clinical improvement.   PLAN:  We will continue daily irrigations and the ribbon packing.  We  will reevaluate the patient in 2 weeks.  If the patient notices any  increased drainage, pain or erythema, she will call the clinic for an  interim appointment.      Harold A. Tanda Rockers, M.D.  Electronically Signed     HAN/MEDQ  D:  02/18/2007  T:  02/18/2007  Job:  [id number]

## 2010-05-24 NOTE — Assessment & Plan Note (Signed)
[facility]   NAME:  ALVEENA, TAIRA NO.:  [id number]   MEDICAL RECORD NO.:  [id number]      DATE OF BIRTH:  11/29/1931   PHYSICIAN:  Theresia Majors. Tanda Rockers, M.D. VISIT DATE:  03/19/2007                                   OFFICE VISIT   SUBJECTIVE:  Ms. Benning is a 77 year old lady with a suture granuloma  in the infraumbilical area.  In the interim, she has been managing this  wound with daily irrigations and antiseptic soap wash.  There has been  no excessive drainage, no malodor, no fever, and no pain.   OBJECTIVE:  Her blood pressure is 143/63, respirations 18, pulse rate  82, temperature is 97.8.  Capillary blood glucose is 173 mg percent.  She is accompanied by her husband.  Inspection of the wound shows no evidence of periwound erythema,  induration or drainage.  There is a small sinus which, when probed with  a Q-tip, does not permit the entrance of the cotton portion.  The woody  portion of the Q-tip was used to sound the wound.  There has been  definite decrease in the depth.  There is no evidence of active  infection or malodor.  A review of the previous culture has shown no  growth in 2 days.   ASSESSMENT:  Clinical improvement of abdominal wound attributed to a  suture granuloma.   PLAN:  We will continue the daily irrigations with antiseptic soap.  We  will reevaluate the patient in 2 weeks p.r.n.      Harold A. Tanda Rockers, M.D.  Electronically Signed     HAN/MEDQ  D:  03/19/2007  T:  03/19/2007  Job:  [id number]

## 2010-05-24 NOTE — Assessment & Plan Note (Signed)
[facility]                         GASTROENTEROLOGY OFFICE NOTE   Lauren Crosby, Lauren Crosby                      MRN:          [id number]  DATE:08/28/2006                            DOB:          [birth year]    REASON FOR EVALUATION:  Surveillance colonoscopy.   HISTORY:  This is a 77 year old white female with insulin-requiring  diabetes mellitus who presents today regarding surveillance colonoscopy.  She has a history of adenomatous colon polyps on colonoscopy in 2001.  Subsequent colonoscopy in 2005 revealing a hamartomatous polyp which was  removed, as well as marked diverticulosis.  Followup in 3 years  recommended.  In terms of GI issues, she developed an incarcerated  ventral hernia with strangulation of a tiny area of transverse colon in  November of 2007.  She underwent operative repair of the transverse  colon as well as repair of the incisional hernia.  This has been  complicated by abdominal wall abscess for which she underwent incision  and drainage therapy in June of 2008.  She has been packing the area  which has gradually improved.  She denies other GI problems such as  nausea, vomiting, heartburn, dysphagia, change in bowel habits or rectal  bleeding.   PAST MEDICAL HISTORY:  1. Insulin-requiring type 2 diabetes mellitus.  2. Hypertension.  3. Dyslipidemia.   PAST SURGICAL HISTORY:  1. Repair of ventral hernia as discussed above.  2. History of perforated diverticula with surgery in 1993.  3. Status post hysterectomy.   ALLERGIES:  PENICILLIN.   CURRENT MEDICATIONS:  1. Humulin insulin 70/30 thirty units subcu q.a.m. and fifty units      subcu q.p.m.  2. Metformin 1000 mg b.i.d.  3. Benicar 40 mg daily.  4. Vytorin 10/20 mg daily.  5. Poly-iron 150 twice daily.  6. Multivitamin daily.   FAMILY HISTORY:  Negative for gastrointestinal malignancy.   SOCIAL HISTORY:  The patient is married with 6 children, lives with her  husband.  She is retired from [employer].  Does not smoke or use  alcohol.   REVIEW OF SYSTEMS:  Per diagnostic evaluation form.   PHYSICAL EXAMINATION:  Well-appearing female in no acute distress.  Blood pressure 136/80, heart rate 80, weight is 223 pounds.  She is 5  feet 3 inches in height.  HEENT:  Sclerae anicteric.  Conjunctivae are pink.  Oral mucosa is  intact.  No adenopathy.  LUNGS:  Clear.  HEART:  Regular.  ABDOMEN:  Obese and soft without tenderness, mass, or hernia.  She does  have a small area draining above the umbilicus where she had hernia  repair.  This is nontender.  EXTREMITIES:  Without edema.   IMPRESSION:  1. History of adenomatous colon polyps, due for surveillance      colonoscopy.  2. History of diverticulosis complicated by perforated diverticulitis,      requiring surgery.  3. Problems with incarcerated ventral hernia, status post repair of      portion of transverse colon and repair of hernia.  Problem      subsequently complicated by abdominal wall abscess which has  required incision and drainage.  This is gradually improving.  4. Insulin-requiring diabetes mellitus.  5. Other general medical problems.   RECOMMENDATIONS:  1. Colonoscopy with polypectomy if necessary.  The nature of the      procedure, as well as the risks, benefits, and alternatives have      been reviewed.  She understood and agreed to proceed.  With regards      to management of the patient's diabetes, we will ask her to hold      her insulin and Metformin the morning of the exam.  We will      schedule the exam for a morning slot.  2. Ongoing management of abdominal wall abscess per Dr. Carolynne Edouard.     Wilhemina Bonito. Marina Goodell, MD  Electronically Signed    JNP/MedQ  DD: 08/28/2006  DT: 08/28/2006  Job #: [id number]   cc:   Jeannett Senior A. Evlyn Kanner, M.D.  Robert A. Nicholos Johns, M.D.  Ollen Gross. Vernell Morgans, M.D.

## 2010-05-24 NOTE — Assessment & Plan Note (Signed)
[facility]   NAME:  Lauren Crosby, Lauren Crosby NO.:  [id number]   MEDICAL RECORD NO.:  [id number]      DATE OF BIRTH:  [birth year]   PHYSICIAN:  Maxwell Caul, M.D. VISIT DATE:  07/26/2007                                   OFFICE VISIT   LOCATION:  [facility].   [name] is a 77 year old woman we have been following for suture  granuloma associated with a wound in what was a ventral hernia repair.  She was discharged from the clinic once in late March at which time the  wound had healed over.  By description of our notes that actually had a  depth of up to 1 cm at some point.  The wound re-opened before she  reappeared here.  The culture grew MRSA.  She was put on doxycycline and  there was a considerable amount of improvement once the doxycycline was  started.  When I had seen her last week, the wound was very tiny.  She  was using Dakin's solution on this, which I advised to discontinue and  asked her to apply Iodosorb.   On examination, temperature is 98, pulse 76, respirations 20, blood  pressure is 146/66.  The area in question has actually completely  epithelialized over.  There is a small raised area there which the  patient claims is tender, although I see really no evidence of  infection.  They are also concerned about a cystic area just cephalad to  this original wound that we were applying about 2 inches cephalad.  This  is in the middle of an older surgical scar.  Again, this was not tender.  There was no drainage.  There was no suggestion of a significant  cellulitis here.   IMPRESSION:  Surgical wound in the abdomen with what Dr. Tanda Rockers termed  a suture granuloma.  This is healed over yet again.  I have advised to  discontinue the Iodosorb to simply protect the area from rubbing on  clothing and to follow this along.  If this reappears, I would seek  surgical input from the surgeons.  The patient and her  husband are both  very concerned about deeper infection, although I see no evidence that  this is the case currently and I have simply recommended conservative  follow-up.   We have discharge her from this clinic; however, my thoughts about if  this reappears are outlined above.  Hopefully, this was an infectious  issue that is now resolved.           ______________________________  Maxwell Caul, M.D.     MGR/MEDQ  D:  07/26/2007  T:  07/26/2007  Job:  [id number]

## 2010-05-24 NOTE — Assessment & Plan Note (Signed)
[facility]   NAME:  Lauren Crosby, Lauren Crosby NO.:  [id number]   MEDICAL RECORD NO.:  [id number]      DATE OF BIRTH:  1931-08-12   PHYSICIAN:  Maxwell Caul, M.D. VISIT DATE:  07/11/2007                                   OFFICE VISIT   LOCATION:  [facility].   [name] is a 77 year old woman we had been following for a suture  granuloma associated wound in a ventral hernia repair.  She was  discharged from the clinic in March at which time the wound had healed.  It actually had depth of up to 1 cm at some point.  She tells me that  this area reopened sometime in the recent month.  There was a fair  amount of drainage 2 weeks ago when she saw her primary physician.  Culture grew MRSA.  She was treated with doxycycline, and per her and  her husband's description there is considerable improvement in the  amount of drainage and the condition of the wound.   On examination, her temperature is 97.7, pulse 88, blood pressure  143/79.  Wound Exam:  The wound is in roughly the same spot as we  treated last time, and the scar tissue of what was, I think, a hernia  repair appears as a very tiny wound, measures 0.5 x 0.5.  I probed this  with the end of a Q-tip, and there really is no depth to this.  There is  no evidence that there is a deeper sinus.  Her husband brought to my  attention a raised area probably 2 inches cephalad to this area which, I  believe, is in an older surgical scar.  There is a raised tender area  here but no overt infection.  I wonder about some form of retained  suture operative material there, but I did not open this.   IMPRESSION:  Surgical wound in the abdomen.  I think this area looks  generally healthy.  They had been applying peroxide to this, and I have  asked them to stop doing this, to clean it with antibacterial soap, and  I have given them a prescription for Iodosorb.  They have completed, I  believe, or should be completing their course of doxycycline, I do not  really think any additional antibiotics are necessary.  Her husband was  very concerned about a possible connection between this area and the  area described above in the older surgical wound; however, I really  doubt that this is the case.  I will review them again in 2 weeks' time.           ______________________________  Maxwell Caul, M.D.     MGR/MEDQ  D:  07/11/2007  T:  07/12/2007  Job:  [id number]

## 2010-05-24 NOTE — Assessment & Plan Note (Signed)
[facility]   NAME:  Lauren Crosby, Lauren Crosby NO.:  [id number]   MEDICAL RECORD NO.:  [id number]      DATE OF BIRTH:  1931/03/06   PHYSICIAN:  Theresia Majors. Tanda Rockers, M.D. VISIT DATE:  03/05/2007                                   OFFICE VISIT   SUBJECTIVE:  Lauren Crosby is a 77 year old lady who we are following for  a suture granuloma with a history of MRSA infection.  In the interim,  she has been irrigating the sinus with saline and placing a quarter inch  ribbon packing.  There has been no excessive drainage, malodor, pain or  fever.  She continues to be ambulatory.  She is accompanied by her  husband.   OBJECTIVE:  Blood pressure is 137/80, respirations 18, pulse rate 85,  temperature is 98.5.  Capillary blood glucose is 154 mg percent.  The  inspection of the abdomen shows the midline incisions have intense scars  with a central area of sinus measuring approximately 3 mm in diameter.  The Q-tip would not easily permit for sounding of the sinus.  The woody  portion of the Q-tip was used as the probe and this area does extend to  a depth of approximately 1 cm.  There is a serosanguineous drainage of  no particular malodor.  There is no periwound erythema or maceration.   ASSESSMENT:  Contracture of the wound with no evidence of active  infection.   PLAN:  We will continue the daily irrigations.  The patient has been on  a course of doxycycline.  She will complete that course.  We have  cultured the wound today.  We will reevaluate her in two weeks p.r.n.      Harold A. Tanda Rockers, M.D.  Electronically Signed     HAN/MEDQ  D:  03/05/2007  T:  03/05/2007  Job:  [id number]

## 2010-05-27 NOTE — Op Note (Signed)
   Lauren Crosby, Lauren Crosby                      ACCOUNT NO.:  [id number]   MEDICAL RECORD NO.:  [id number]                   PATIENT TYPE:  AMB   LOCATION:  DAY                                  FACILITY:  [facility]   PHYSICIAN:  Boston Service, MD               DATE OF BIRTH:  1931-06-02   DATE OF PROCEDURE:  09/18/2001  DATE OF DISCHARGE:                                 OPERATIVE REPORT   PREOPERATIVE DIAGNOSIS:  A 5 x 8 mm calculus, left ureterovesical junction.   PROCEDURE:  1. Cystoscopy.  2. Retrograde.  3. Ureteroscopy.  4. Stone manipulation.   ANESTHESIA:  General.   PREMEDICATION:  80 mg of IM gentamycin.   INDICATIONS:  A 77 year old female, multiple medical problems related to  heart disease, diabetes, and uric acid urolithiasis.  The patient has done  well on allopurinol in the past.  The patient discontinued therapy in May  2002, seen at the ER September 16, 2001, 5 x 8 mm calculus left proximal  ureter.  Follow-up KUB a day later, stone in the distal ureter.  The patient  has intractable nausea and pain.  Plans made for ureteroscopy and stone  manipulation.   DESCRIPTION OF PROCEDURE:  The patient was prepped and draped in the dorsal  lithotomy position after institution of an adequate level of general  anesthesia.  Well-lubricated 21 French panendoscope was gently inserted at  the urethral meatus.  Normal urethra and sphincter.  Trigone showed  prominent descensus.  Bladder showed no evidence of tumor, stone, bleeding,  or other anatomic abnormality.  Right retrograde, normal course and caliber  of the ureter, pelvis, and calices with prompt drainage at 3-5 minutes.  Right retrograde showed a 5 x 8 mm filling defect in the left distal ureter.  Guidewire was negotiated beyond the filling defect with immediate efflux of  copious amount of bloody, cloudy urine from the left renal pelvis.  Once  this efflux was allowed to subside, ureteroscope was inserted alongside  the  guidewire.  Stone was easily identified within the distal ureter, negotiated  into the nitinol basket and then withdrawn.  Ureteroscope was reinserted.  No additional stony fragments identified within the ureter.  Short 6 [place]  scope was passed to the level of the UPJ.  No other intraureteral pathology  was identified.  Ureteroscope was withdrawn; bladder was drained; cystoscope  was removed.  The patient was returned to recovery in satisfactory  condition.                                               Boston Service, MD    RH/MEDQ  D:  09/18/2001  T:  09/19/2001  Job:  [id number]   cc:   Tinnie Gens A. Tawanna Cooler, M.D. [facility]

## 2010-05-27 NOTE — Op Note (Signed)
Lauren Crosby, Lauren Crosby NO.:  [id number]   MEDICAL RECORD NO.:  [id number]          PATIENT TYPE:  INP   LOCATION:  1604                         FACILITY:  [facility]   PHYSICIAN:  Lebron Conners, M.D.   DATE OF BIRTH:  1931-05-02   DATE OF PROCEDURE:  12/02/2005  DATE OF DISCHARGE:                                 OPERATIVE REPORT   PREOPERATIVE DIAGNOSIS:  Incarcerated ventral hernia.   POSTOPERATIVE DIAGNOSIS:  Incarcerated ventral hernia with strangulation of  tiny area of transverse colon.   OPERATION:  Repair of transverse colon and repair of incisional hernia.   SURGEON:  Lebron Conners, M.D.   ANESTHESIA:  General   PROCEDURE:  After the patient was monitored and asleep and had routine  preparation and draping of the abdomen, I made a midline incision utilizing  a portion of the midline incision over the palpable hernia and extending it  cephalad for several centimeters.  I dissected down through the subcutaneous  tissues to the fascia and to an obvious incarcerated hernia.  I separated  the hernia from the surrounding normal subcutaneous tissues.  I very  carefully incised the fascia above the hernia and worked down to it and then  separated it from the fascia all the way around.  There was an additional  hernia with several small defects just inferior to that and I opened those  all into the midline incision.  I then cleared the adhesions away from the  abdominal wall laterally for several centimeters until I was sure I could be  closed the fascia without problem.  I noted the transverse colon and most of  it looked just fine.  However, there was one area where there was good deal  of fat which was very hard and indurated on the anterior superior surface  and portion of the transverse colon.  I dissected that and fell into a  cavity which seemed as though it was somewhat like a diverticulum or small  abscess but not the bowel wall itself.  I cleared that  away and came right  down to the colon and saw that there was a tiny perforation of the anterior  aspect of the transverse colon.  I cleared away the indurated and unhealthy  fat for a goodly distance around that and freshened the edges slightly.  The  tissues looked nice and healthy and so I primarily closed that with several  sutures of 2-0 silk taking generous bites as the colon caliber was quite  large in that area.  I noted that there was no distension of the colon in  the area.  After completing that closure I got some healthy fat from an area  adjacent to the repair and sutured that down with several silk sutures to  the bowel wall and fat surrounding it to further excluded it from the  peritoneal cavity.  I then copiously irrigated that area and removed  irrigant.  I then cleared the fat and skin from the fascia laterally for  several centimeters approximately to the edges of the rectus muscle on each  side.  I got good hemostasis.  I did not feel that with the contaminated  situation being present, that I should and should use mesh for repair.  I  primarily closed the fascia with running #1 PDS after debriding thinned out  and weak fascia.  I was well satisfied with the closure, but it seemed  somewhat tense so I cut relaxing incisions in the anterior rectus sheath  bilaterally and that relaxed the anterior abdominal wall nicely.  I noted  that on the left side I made one small hole through both anterior and  posterior sheath and I closed that with two sutures of #1 PDS.  I then  irrigated the subcutaneous tissues prior to the closure of the fascia.  I  had placed a round Blake drain in the anterior abdominal cavity adjacent to  the repair of the colon to serve as a indicator of any possible leakage.  I  placed an additional drain in the subcutaneous tissues and brought both out  through stab incisions and sutured them to the skin.  The abdominal drain  was in the right upper   quadrant and subcutaneous drain in the left upper quadrant.  I closed the  skin with staples.  I noted that I had made a hole in the umbilical skin and  I closed that skin staples was well.  I applied suction to the drains.  The  sponge, needle and instrument counts were correct and the patient went to  PACU in stable condition.      Lebron Conners, M.D.  Electronically Signed     WB/MEDQ  D:  12/02/2005  T:  12/03/2005  Job:  [id number]

## 2010-05-27 NOTE — H&P (Signed)
NAME:  Lauren Crosby, SAGGESE                       ACCOUNT NO.:  [id number]   MEDICAL RECORD NO.:  [id number]                   PATIENT TYPE:  INP   LOCATION:  9307                                 FACILITY:  [facility]   PHYSICIAN:  Duke Salvia. Marcelle Overlie, M.D.            DATE OF BIRTH:  05-22-1931   DATE OF ADMISSION:  01/28/2002  DATE OF DISCHARGE:                                HISTORY & PHYSICAL   NOTE:  This patient's was performed today at [facility] but due to  lack of bed space she is being sent to [facility] to be hospitalized  postoperatively.   CHIEF COMPLAINT:  Increased pelvic pressure.   HISTORY OF PRESENT ILLNESS:  The patient is a 77 year old G7, P6 patient who  has had a prior hysterectomy for uterine prolapse in 1971 presented with  increased pelvic pressure. Examination shows a cystocele, rectocele and  possible cuff prolapse.  She presents now for A&P repair and possible SSLS.  This procedure including risk of bleeding, infection, transfusion, the  possible need for additional surgery, other risks relative to phlebitis and  her postoperative recovery have been reviewed also.   PAST MEDICAL HISTORY:   ALLERGIES:  PENICILLIN.   PAST SURGICAL HISTORY:  Hysterectomy, appendectomy.   CURRENT MEDICATIONS:  1. Glucophage b.i.d.  2. Zestoretic daily.  3. Allopurinol daily.  4. Lipitor  5. Humulin insulin.   MEDICAL PHYSICIAN:  Tera Mater. Evlyn Kanner, M.D.   REVIEW OF SYMPTOMS:  Significant for history of insulin-dependent diabetes  mellitus since 1989, Gastroesophageal reflux disease, history of  diverticulitis.  The patient underwent colonoscopy January of 2003.  Also  thalassemia minor.   OBSTETRICAL HISTORY:  The patient has had six vaginal deliveries and on SAB.   PHYSICAL EXAMINATION:  VITAL SIGNS: Blood pressure 174/68.  HEENT: Unremarkable.  NECK:  Supple without masses.  LUNGS:  Clear.  CARDIOVASCULAR:  Regular rate and rhythm without murmurs, rubs  or gallops  noted.  BREASTS: Without masses.  ABDOMEN:  Soft, flat, nontender.  PELVIC:  Examination of vulva unremarkable.  On straining a cystocele and  rectocele are noted.  The cuff support was moderate.  She did have some  tenderness on initial examination therefore ultrasound was carried out in  our office on January 15, 2002 that showed no adnexal masses or free fluid.  EXTREMITIES:  Unremarkable.  NEUROLOGICAL:  Unremarkable.  RECTAL/VAGINAL:  Examination confirmed the above findings.   IMPRESSION:  Systemic cystocele and rectocele, possible cuff prolapse.   PLAN:  Anterior and posterior repair, possible SSLS.  The procedure and  risks were reviewed as above.                                               Richard M. Marcelle Overlie, M.D.    RMH/MEDQ  D:  01/28/2002  T:  01/28/2002  Job:  [id number]

## 2010-05-27 NOTE — Cardiovascular Report (Signed)
NAMETRANICE, LADUKE NO.:  [id number]   MEDICAL RECORD NO.:  [id number]                   PATIENT TYPE:  OIB   LOCATION:  6501                                 FACILITY:  [facility]   PHYSICIAN:  [name], M.D.              DATE OF BIRTH:  [birth year]   DATE OF PROCEDURE:  01/16/2003  DATE OF DISCHARGE:                              CARDIAC CATHETERIZATION   Lauren Crosby is 77 year old female with a recent onset of chest pain.  She  had a stress Cardiolite study which revealed anterior attenuation which was  thought to be either due to ischemia or perhaps breast attenuation.  She is  referred for heart catheterization for further evaluation.   The right femoral artery was easily cannulated using modified Seldinger  technique.   HEMODYNAMICS:  The left ventricular pressure was 128/6 with an aortic  pressure of 125/60.   ANGIOGRAPHY:  The left main coronary artery is smooth and normal.   The left anterior descending artery is a fairly large vessel.  It is fairly  smooth and normal throughout its course.   The left circumflex artery is large and dominant.  It gives off a large and  normal first obtuse marginal artery, moderate to large posterior lateral  branch and a moderate to large posterior descending artery.  All of these  branches are normal.   The right coronary is moderate to small in size.  It gives off multiple RV  branches only, but does not supply any part of the left ventricle.   COMPLICATIONS:  None.   CONCLUSION:  1. Smooth and normal coronary arteries.  2. Normal left ventricular systolic function.                                               [name], M.D.    PJN/MEDQ  D:  01/16/2003  T:  01/16/2003  Job:  [id number]   cc:   Cassell Clement, M.D.  [address]  [place]  [place] [address]  Fax: [phone]

## 2010-05-27 NOTE — Op Note (Signed)
NAME:  Lauren Crosby, Lauren Crosby                       ACCOUNT NO.:  [id number]   MEDICAL RECORD NO.:  [id number]                   PATIENT TYPE:  AMB   LOCATION:  DAY                                  FACILITY:  [facility]   PHYSICIAN:  Duke Salvia. Marcelle Overlie, M.D.            DATE OF BIRTH:  1931/10/19   DATE OF PROCEDURE:  01/28/2002  DATE OF DISCHARGE:                                 OPERATIVE REPORT   PREOPERATIVE DIAGNOSES:  Symptomatic cystocele and rectocele, cup prolapse.   POSTOPERATIVE DIAGNOSES:  Symptomatic cystocele and rectocele, cup prolapse.   PROCEDURE:  Anterior and posterior colporrhaphy, sacrospinous ligament  suspension.   SURGEON:  Duke Salvia. Marcelle Overlie, M.D.   ASSISTANT:  Dineen Kid. Rana Snare, M.D.   ANESTHESIA:  General endotracheal.   COMPLICATIONS:  None.   DRAINS:  Foley catheter.   ESTIMATED BLOOD LOSS:  250 cc   DESCRIPTION OF PROCEDURE:  The patient was taken to the operating room and  after an adequate level of general endotracheal anesthesia was obtained with  the patient's legs in stirrups, the perineum and vagina prepped and draped  in the usual manner with Betadine. The bladder was emptied. The patient's  legs were extended, a weighted speculum was positioned, the cup was grasped  with Allis clamps. A midline vaginal incision was made and carried up to the  UV angle. The underlying perivesical fascia was dissected free with sharp  and blunt dissection. The cystocele was reduced by plicating with 2-0 Dexon  sutures in the midline of the paravesical fascia starting at the UV angle  posterior. Excess vaginal mucosa was trimmed, the vaginal mucosa was  plicated in the midline with 2-0 Dexon interrupted sutures. Attention  directed to the posterior repair. A small triangle of perineal skin was  excised. The posterior vaginal mucosa was incised up to the cuff. The  surrounding perirectal fascia was then separated by sharp and blunt  dissection. Her right ischial spine  could be easily palpated and the  sacrospinous ligament could be identified. The _____ needle holder was then  used to pass a 2-0 Prolene suture through the sacrospinous ligament staying  two fingerbreadths medial to the palpation of the ischial spine. This was  held temporarily. A small amount of vaginal mucosa that was excessive was  trimmed along the posterior side. The other end of the sacrospinous ligament  suture was placed in the posterior mucosa and a double bite at the cuff. The  posterior cuff was then closed with 2-0 Dexon interrupted sutures halfway  down and then the sacrospinous suture was tied with excellent support. The  remainder of the posterior mucosa was reapproximated. This was done after  plicating the perirectal fascia in the  midline and reducing the rectocele. 3-0 Vicryl repeat sutures used on the  perineum. A vaginal pack was then placed. Foley catheter positioned draining  clear urine. She tolerated this well and went to the recovery room  in good  condition.                                                Richard M. Marcelle Overlie, M.D.    RMH/MEDQ  D:  01/28/2002  T:  01/28/2002  Job:  [id number]

## 2010-05-27 NOTE — Discharge Summary (Signed)
NAMESHACOYA, BURKHAMMER NO.:  [id number]   MEDICAL RECORD NO.:  [id number]          PATIENT TYPE:  INP   LOCATION:  1604                         FACILITY:  [facility]   PHYSICIAN:  Lebron Conners, M.D.   DATE OF BIRTH:  [birth year]   DATE OF ADMISSION:  12/02/2005  DATE OF DISCHARGE:  12/07/2005                               DISCHARGE SUMMARY   HISTORY:  Ms. Pidcock is a 77 year old white female who presented with a  2-day history of upper abdominal pain.  There were chills but no fever  and she had no vomiting.  She had a sigmoid colectomy and an  appendectomy because of the diverticulitis a number of years ago.  On CT  scan done for evaluation of the pain, she was found to have an  incisional hernia with incarceration of the transverse colon and a good  deal of edema around it suggesting compromise of bowel.  The white count  was normal.  Hemoglobin 8.9 and she is chronically anemic.  Further  medical problems are type 2 diabetes, hyperlipidemia, hypertension and  obesity.  She had been thoroughly evaluated for the anemia and no GI  source was found.  In addition to the colon surgery, she had an A&P  repair, rotator cuff surgery and knee surgery.  She is allergic to  PENICILLIN.   MEDICINES:  Humulin 70/30, Glucophage, Zestoretic, Vytorin, vitamins,  iron.   PHYSICAL EXAMINATION:  There was a well-healed midline incision.  There  is a tender mass with slight redness of the skin at the upper end of the  midline incision.  No other masses were seen and no other hernia was  noted.  Bowel sounds were present but decreased.  No rebound tenderness.  The patient was obese.   HOSPITAL COURSE:  As soon as the patient received IV fluids and  antibiotics, I took her to the operating room where I found an  incarcerated ventral hernia with a tiny perforation of a bit of the  transverse colon.  I did a repair of the colon and repair of the ventral  hernia with suction drain  left in the subcutaneous space and in the  subfascial space near the repair.  The patient did quite well  postoperatively with minimal GI disruption.  She had fever at first and  that resolved with continuation of antibiotics.  By the fifth  postoperative day, her bowels had moved, and she was feeling quite well,  the incision was healing nicely.  I sent her home with drains and the  staples in place and suggested that she return for removal of the drains  and staples in about a week.  She was to call me if there was any fever  or worsening of the condition in any way.   DIAGNOSES:  1. Incarcerated incisional hernia with strangulation and perforation      of the transverse colon.  2. Obesity.  3. Type 2 diabetes.  4. Hyperlipidemia.  5. Hypertension.  6. Chronic anemia.   OPERATION:  Repair of colon perforation and repair of incisional hernia.   DISCHARGE CONDITION:  Improved.      Lebron Conners, M.D.  Electronically Signed     WB/MEDQ  D:  12/18/2005  T:  12/18/2005  Job:  [id number]

## 2010-05-27 NOTE — Discharge Summary (Signed)
NAME:  Lauren Crosby, Lauren Crosby                       ACCOUNT NO.:  [id number]   MEDICAL RECORD NO.:  [id number]                   PATIENT TYPE:  AMB   LOCATION:  DAY                                  FACILITY:  [facility]   PHYSICIAN:  Duke Salvia. Marcelle Overlie, M.D.            DATE OF BIRTH:  July 11, 1931   DATE OF ADMISSION:  01/28/2002  DATE OF DISCHARGE:  01/29/2002                                 DISCHARGE SUMMARY   DISCHARGE DIAGNOSES:  1. Symptomatic cystocele, rectocele, and cuff prolapse.  2. Anterior and posterior repair, SSLS this admission.   HISTORY AND PHYSICAL:  For summary of history and physical examination,  please see admission H&P for details.  Briefly, a 77 year old G7, P6, prior  hysterectomy presents with symptomatic pelvic relaxation symptoms consisting  of cystocele, rectocele, and cuff prolapse for surgical repair.   HOSPITAL COURSE:  On January 20 under general anesthesia the patient  underwent A&P repair and SSLS under general anesthesia with an EBL 250 cubic  centimeters.  This was done at [facility] due to a bed shortage, but  transferred to [facility] for postoperative care.  On the afternoon of  January 28, 2002 was afebrile.  Was on sliding scale insulin.  The catheter  was removed the following a.m. along with vaginal packing.  Her diet was  advanced.  She was voiding without difficulty and by 1:00 that afternoon was  ready for discharge, tolerating p.o.'s and remaining afebrile.  Preoperative  hemoglobin was 9.4.  Postoperative on January 21 was 7.5.  She does have a  history of thalassemia.  Other laboratory data:  CMET showed a glucose of  225, BUN of 32, creatinine of 1.0.  UA was normal except for large  leukocytes, 7-10 wbc's.  Blood type was A+.  Antibody screen was negative.  Chest x-ray dated September 18, 2001:  Stable cardiomegaly, bronchitic  change, no acute process.  EKG dated September 2003 showed inferior infarct  age undetermined, cannot rule  out anterior MI.  No other comments were  noted.  Pathology report still pending.   The patient was discharged on Tylenol No.3 p.r.n. pain, Phazyme stool  softeners, and laxative of choice.  Will return to our office in one week.  Will return to her normal insulin schedule along with her normal regular  medications.  Advised to report any fever over 101, voiding difficulties,  heavy vaginal bleeding.  For one month will take multivitamin with iron such  as Centrum.   CONDITION ON DISCHARGE:  Good.   ACTIVITY:  Graded increase.                                               Richard M. Marcelle Overlie, M.D.   RMH/MEDQ  D:  01/29/2002  T:  01/29/2002  Job:  [id number]   cc:   Tera Mater. Evlyn Kanner, M.D.  [address]  [place]  [place] [address]  Fax: [phone]

## 2010-05-27 NOTE — H&P (Signed)
Lauren Crosby, Lauren Crosby NO.:  [id number]   MEDICAL RECORD NO.:  [id number]          PATIENT TYPE:  INP   LOCATION:  0102                         FACILITY:  [facility]   PHYSICIAN:  Lebron Conners, M.D.   DATE OF BIRTH:  01-15-1931   DATE OF ADMISSION:  12/02/2005  DATE OF DISCHARGE:                                HISTORY & PHYSICAL   CHIEF COMPLAINT:  Abdominal pain.   PRESENT ILLNESS:  The patient is a 77 year old white female with a 2-day  history of upper abdominal pain.  She has had some chills but no fever.  She  had no vomiting.  The patient has a history of having had a sigmoid  colectomy and appendectomy because of diverticulitis.  That was a number of  years ago.  The patient had no idea that she had a ventral hernia.  However,  she had a CT scan done in the emergency department which demonstrated that  there was an incarcerated ventral hernia containing a portion of the  transverse colon with a good deal of edema around it.  She is admitted to  the hospital for repair of this problem.  White counts are normal.  Hemoglobin is 8.9 and is chronically low.  The patient agrees to have me  explore this area and resect the colon if necessary, repair the hernia and  possibly do a colostomy if I feel it is indicated.   PAST MEDICAL HISTORY:  1. She has type 2 diabetes mellitus and does take insulin.  2. She has hyperlipidemia.  3. Hypertension.  4. History of anemia.   PAST SURGERIES:  In addition to  surgery on the colon:  1. A&P repair.  2. Rotator cuff repair.  3. Some knee surgery.   SOCIAL HISTORY:  She is a nonsmoker and nondrinker.   ALLERGIES:  She is allergic to PENICILLIN.   MEDICATIONS:  1. Humulin 70/30.  2. Glucophage.  3. Zestoretic.  4. Vytorin.  5. Vitamins.  6. Iron.   REVIEW OF SYSTEMS:  She has no history of shortness of breath, chest pain,  GI problems or pulmonary problems, and the remainder of the review of  systems is  unremarkable, as well.   FAMILY HISTORY AND CHILDHOOD ILLNESSES:  Unremarkable.   PHYSICAL EXAMINATION:  VITAL SIGNS:  Temperature and vital signs were  unremarkable, as recorded by nursing staff.  The patient is obese.  She was  in no acute distress and mental status was normal.  She answered all of her  questions appropriately.  HEAD/NECK:  Unremarkable with no enlargement of thyroid.  No neck mass.  No  cervical lymphadenopathy.  CHEST:  Clear to auscultation.  BREASTS:  Symmetric and there was no murmur or gallop detected.  HEART:  The heart rate and rhythm are normal.  No murmur or gallop detected.  ABDOMEN:  There is a well-healed midline incision.  There is a tender mass  with slight redness of the skin at the upper end of the midline incision.  No other masses are noted.  No other hernias were noted.  Bowel sounds were  present but decreased.  The patient has no rebound tenderness.  EXTREMITIES:  There is moderate edema and slight stasis change bilaterally  the lower extremities.  Pulses are good.  No ulcers are present.  Full range  of motion of the joints.  SKIN:  No lesions are noted except for stasis.  NEUROLOGIC:  Grossly no problems.  LYMPH NODES:  Not enlarged on axilla, groin or neck.   IMPRESSION:  1. Incarcerated ventral hernia containing colon.  2. Type 2 diabetes.  3. Hypertension.  4. Hyperlipidemia.  5. Anemia.   PLAN:  Immediate exploration of the hernia with treatment as warranted by  the findings.      Lebron Conners, M.D.  Electronically Signed     WB/MEDQ  D:  12/02/2005  T:  12/02/2005  Job:  [id number]

## 2010-06-25 ENCOUNTER — Emergency Department (HOSPITAL_COMMUNITY): Payer: Medicare HMO

## 2010-06-25 ENCOUNTER — Inpatient Hospital Stay (HOSPITAL_COMMUNITY)
Admission: EM | Admit: 2010-06-25 | Discharge: 2010-06-30 | DRG: 313 | Disposition: A | Discharge status: Home Health | Payer: Medicare HMO | Attending: Internal Medicine | Admitting: Internal Medicine

## 2010-06-25 DIAGNOSIS — Z880 Allergy status to penicillin: Secondary | ICD-10-CM

## 2010-06-25 DIAGNOSIS — E785 Hyperlipidemia, unspecified: Secondary | ICD-10-CM | POA: Diagnosis present

## 2010-06-25 DIAGNOSIS — Z79899 Other long term (current) drug therapy: Secondary | ICD-10-CM

## 2010-06-25 DIAGNOSIS — E119 Type 2 diabetes mellitus without complications: Secondary | ICD-10-CM | POA: Diagnosis present

## 2010-06-25 DIAGNOSIS — I10 Essential (primary) hypertension: Secondary | ICD-10-CM | POA: Diagnosis present

## 2010-06-25 DIAGNOSIS — M1710 Unilateral primary osteoarthritis, unspecified knee: Secondary | ICD-10-CM | POA: Diagnosis present

## 2010-06-25 DIAGNOSIS — M109 Gout, unspecified: Secondary | ICD-10-CM | POA: Diagnosis not present

## 2010-06-25 DIAGNOSIS — Z794 Long term (current) use of insulin: Secondary | ICD-10-CM

## 2010-06-25 DIAGNOSIS — K5730 Diverticulosis of large intestine without perforation or abscess without bleeding: Secondary | ICD-10-CM | POA: Diagnosis present

## 2010-06-25 DIAGNOSIS — Z9049 Acquired absence of other specified parts of digestive tract: Secondary | ICD-10-CM

## 2010-06-25 DIAGNOSIS — R0789 Other chest pain: Principal | ICD-10-CM | POA: Diagnosis present

## 2010-06-25 DIAGNOSIS — Z8249 Family history of ischemic heart disease and other diseases of the circulatory system: Secondary | ICD-10-CM

## 2010-06-25 DIAGNOSIS — D509 Iron deficiency anemia, unspecified: Secondary | ICD-10-CM | POA: Diagnosis present

## 2010-06-25 DIAGNOSIS — Z87442 Personal history of urinary calculi: Secondary | ICD-10-CM

## 2010-06-25 LAB — COMPREHENSIVE METABOLIC PANEL
ALT: 26 U/L (ref 0–35)
Alkaline Phosphatase: 105 U/L (ref 39–117)
CO2: 26 mEq/L (ref 19–32)
Calcium: 9.9 mg/dL (ref 8.4–10.5)
Chloride: 100 mEq/L (ref 96–112)
GFR calc Af Amer: >60 mL/min (ref >60)
GFR calc non Af Amer: >60 mL/min (ref >60)
Glucose, Bld: 157 mg/dL — ABNORMAL HIGH (ref 70–99)
Potassium: 4 mEq/L (ref 3.5–5.1)
Sodium: 137 mEq/L (ref 135–145)
Total Bilirubin: 0.3 mg/dL (ref 0.3–1.2)

## 2010-06-25 LAB — DIFFERENTIAL
Basophils Relative: 0 % (ref 0–1)
Monocytes Relative: 7 % (ref 3–12)
Neutro Abs: 2.9 10*3/uL (ref 1.7–7.7)
Neutrophils Relative %: 54 % (ref 43–77)

## 2010-06-25 LAB — CBC
Hemoglobin: 9.1 g/dL — ABNORMAL LOW (ref 12.0–15.0)
MCH: 20.5 pg — ABNORMAL LOW (ref 26.0–34.0)
RBC: 4.44 MIL/uL (ref 3.87–5.11)
WBC: 5.4 10*3/uL (ref 4.0–10.5)

## 2010-06-26 LAB — LIPID PANEL
HDL: 48 mg/dL (ref >39)
Total CHOL/HDL Ratio: 2.5 RATIO

## 2010-06-26 LAB — BASIC METABOLIC PANEL
BUN: 29 mg/dL — ABNORMAL HIGH (ref 6–23)
CO2: 24 mEq/L (ref 19–32)
Chloride: 103 mEq/L (ref 96–112)
Creatinine, Ser: 0.78 mg/dL (ref 0.50–1.10)
Glucose, Bld: 116 mg/dL — ABNORMAL HIGH (ref 70–99)

## 2010-06-26 LAB — GLUCOSE, CAPILLARY
Glucose-Capillary: 119 mg/dL — ABNORMAL HIGH (ref 70–99)
Glucose-Capillary: 135 mg/dL — ABNORMAL HIGH (ref 70–99)
Glucose-Capillary: 140 mg/dL — ABNORMAL HIGH (ref 70–99)

## 2010-06-26 LAB — IRON AND TIBC
Saturation Ratios: 25 % (ref 20–55)
UIBC: 241 ug/dL

## 2010-06-26 LAB — HEMOGLOBIN A1C: Hgb A1c MFr Bld: 7.7 % — ABNORMAL HIGH (ref <5.7)

## 2010-06-26 LAB — CARDIAC PANEL(CRET KIN+CKTOT+MB+TROPI)
CK, MB: 2.1 ng/mL (ref 0.3–4.0)
Relative Index: INVALID (ref 0.0–2.5)
Total CK: 64 U/L (ref 7–177)
Total CK: 77 U/L (ref 7–177)

## 2010-06-26 LAB — VITAMIN B12: Vitamin B-12: 484 pg/mL (ref 211–911)

## 2010-06-26 LAB — CBC
HCT: 25.1 % — ABNORMAL LOW (ref 36.0–46.0)
Hemoglobin: 8 g/dL — ABNORMAL LOW (ref 12.0–15.0)
RBC: 3.93 MIL/uL (ref 3.87–5.11)
WBC: 5 10*3/uL (ref 4.0–10.5)

## 2010-06-26 LAB — FOLATE: Folate: >20 ng/mL

## 2010-06-27 ENCOUNTER — Inpatient Hospital Stay (HOSPITAL_COMMUNITY): Payer: Medicare HMO

## 2010-06-27 ENCOUNTER — Ambulatory Visit (HOSPITAL_COMMUNITY)
Admission: RE | Admit: 2010-06-27 | Discharge: 2010-06-27 | Disposition: A | Discharge status: Home / Self Care | Payer: Medicare HMO | Source: Ambulatory Visit | Attending: Cardiology | Admitting: Cardiology

## 2010-06-27 DIAGNOSIS — D649 Anemia, unspecified: Secondary | ICD-10-CM | POA: Insufficient documentation

## 2010-06-27 DIAGNOSIS — E119 Type 2 diabetes mellitus without complications: Secondary | ICD-10-CM | POA: Insufficient documentation

## 2010-06-27 DIAGNOSIS — R079 Chest pain, unspecified: Secondary | ICD-10-CM | POA: Insufficient documentation

## 2010-06-27 DIAGNOSIS — I2510 Atherosclerotic heart disease of native coronary artery without angina pectoris: Secondary | ICD-10-CM | POA: Insufficient documentation

## 2010-06-27 DIAGNOSIS — I10 Essential (primary) hypertension: Secondary | ICD-10-CM | POA: Insufficient documentation

## 2010-06-27 LAB — GLUCOSE, CAPILLARY
Glucose-Capillary: 142 mg/dL — ABNORMAL HIGH (ref 70–99)
Glucose-Capillary: 151 mg/dL — ABNORMAL HIGH (ref 70–99)
Glucose-Capillary: 176 mg/dL — ABNORMAL HIGH (ref 70–99)

## 2010-06-27 LAB — MRSA PCR SCREENING: MRSA by PCR: NEGATIVE

## 2010-06-28 LAB — GLUCOSE, CAPILLARY
Glucose-Capillary: 139 mg/dL — ABNORMAL HIGH (ref 70–99)
Glucose-Capillary: 231 mg/dL — ABNORMAL HIGH (ref 70–99)

## 2010-06-28 LAB — GRAM STAIN

## 2010-06-28 LAB — SYNOVIAL CELL COUNT + DIFF, W/ CRYSTALS
Lymphocytes-Synovial Fld: 19 % (ref 0–20)
Neutrophil, Synovial: 78 % — ABNORMAL HIGH (ref 0–25)

## 2010-06-28 LAB — COMPREHENSIVE METABOLIC PANEL
ALT: 25 U/L (ref 0–35)
AST: 24 U/L (ref 0–37)
Alkaline Phosphatase: 104 U/L (ref 39–117)
CO2: 24 mEq/L (ref 19–32)
Calcium: 8.9 mg/dL (ref 8.4–10.5)
GFR calc non Af Amer: >60 mL/min (ref >60)
Potassium: 4 mEq/L (ref 3.5–5.1)
Sodium: 136 mEq/L (ref 135–145)
Total Protein: 7 g/dL (ref 6.0–8.3)

## 2010-06-28 LAB — CBC
Hemoglobin: 8.3 g/dL — ABNORMAL LOW (ref 12.0–15.0)
MCH: 20.3 pg — ABNORMAL LOW (ref 26.0–34.0)
Platelets: 139 10*3/uL — ABNORMAL LOW (ref 150–400)
RBC: 4.08 MIL/uL (ref 3.87–5.11)

## 2010-06-28 MED ORDER — TECHNETIUM TC 99M TETROFOSMIN IV KIT
30.0000 | PACK | Freq: Once | INTRAVENOUS | Status: AC | PRN
  Administered 2010-06-28: 30 via INTRAVENOUS

## 2010-06-29 LAB — GLUCOSE, CAPILLARY: Glucose-Capillary: 267 mg/dL — ABNORMAL HIGH (ref 70–99)

## 2010-06-30 LAB — POCT OCCULT BLOOD STOOL (DEVICE): Fecal Occult Bld: NEGATIVE

## 2010-06-30 LAB — GLUCOSE, CAPILLARY: Glucose-Capillary: 246 mg/dL — ABNORMAL HIGH (ref 70–99)

## 2010-07-02 LAB — BODY FLUID CULTURE

## 2010-07-03 LAB — ANAEROBIC CULTURE

## 2010-07-05 NOTE — Consult Note (Signed)
Lauren Crosby, Lauren Crosby NO.:  [id number]  MEDICAL RECORD NO.:  [id number]  LOCATION:                                 FACILITY:  PHYSICIAN:  Armanda Magic, M.D.     DATE OF BIRTH:  [birth year]  DATE OF CONSULTATION:  06/26/2010 DATE OF DISCHARGE:                                CONSULTATION   REFERRING PHYSICIAN:  Dr. Betti Cruz with [facility].  CHIEF COMPLAINT:  Chest pain.  HISTORY OF PRESENT ILLNESS:  This is a 77 year old female with a history diabetes mellitus, hypertension, dyslipidemia who presented to the emergency room with complaints of intermittent chest pain since 5:00 p.m. on June 25, 2010.  She had apparently 2 or 3 episodes before presenting to the emergency room and then one episode while in the ER. She describes the pain as a sharp, knife-like, stabbing pain midsternal with radiation to her back, 8/10 in severity, lasting about 5 minutes at a time.  She denied any shortness of breath, nausea, vomiting, or diaphoresis with the pain although she does have chronic shortness of breath at baseline which has not worsened recently.  She apparently states she had a heart cath done years ago but she does not remember when.  We are now asked to consult.  PAST MEDICAL HISTORY: 1. Hypertension. 2. Diabetes. 3. Dyslipidemia. 4. Diverticulosis with diverticulitis.  PAST SURGICAL HISTORY: 1. Surgery for diverticulitis with sigmoid colectomy. 2. Appendectomy. 3. Ventral hernia repair. 4. Cholecystectomy.  ALLERGIES:  PENICILLIN which causes hives.  MEDICATIONS ON ADMISSION: 1. Humulin 70/30 insulin 33 units subcu q.a.m., 60 and subcu q.p.m. 2. Metformin 1000 mg b.i.d. 3. Simvastatin 40 mg nightly. 4. Losartan 100 mg daily. 5. Ferrex 150 mg b.i.d. 6. Centrum Silver 1 tablet daily.  FAMILY HISTORY:  Her mother had hypertension and father had diabetes. Her mother also had hemochromatosis.  There is no family history of CAD.  SOCIAL  HISTORY:  She is married with six children.  She denies any tobacco or alcohol use.  No IV drug use.  REVIEW OF SYSTEMS:  Other than what is stated in HPI is negative.  PHYSICAL EXAMINATION:  VITAL SIGNS:  Blood pressure is 120/70, heart rate 66, respirations 16.  She is afebrile. GENERAL:  This is a well-developed, well-nourished female, in no acute distress. HEENT:  Benign. NECK:  Supple without lymphadenopathy.  Carotid upstrokes are +2 bilaterally.  No bruits. LUNGS:  Clear to auscultation throughout. HEART:  Regular rate and rhythm.  No murmurs, rubs, or gallops.  Normal S1 and S2. ABDOMEN:  Soft, nontender, nondistended.  Normoactive bowel sounds.  No hepatosplenomegaly. EXTREMITIES:  No cyanosis, erythema, or edema.  LABORATORY DATA:  Sodium 137, potassium 4.4, chloride 103, bicarb 24, BUN 29, creatinine 0.78.  White cell count 5, hemoglobin 8, hematocrit 25.1, platelet count 138.  CPK is 68 and 64, MB 1.7 and 1.7, troponin less than 0.3 x2.  EKG shows sinus rhythm with poor R-wave progression, no ST changes.  ASSESSMENT: 1. Atypical chest pain with negative cardiac enzymes in the setting of     significantly reduced H and H, question gastrointestinal etiology,     although she has had chronic  anemia in the past. 2. Anemia of questionable etiology, apparently chronic in the past. 3. Hypertension. 4. Dyslipidemia. 5. Diabetes mellitus.  PLAN:  Check a 2-D echocardiogram to assess LV function, if normal then we will get a 2-day nuclear stress test.  Needs GI workup for sharp epigastric pain with anemia.  We will heme check stools and order iron panel as well as B12 and folate.     Armanda Magic, M.D.     TT/MEDQ  D:  06/26/2010  T:  06/26/2010  Job:  [id number]  cc:   Molly Maduro A. Nicholos Johns, M.D.  Electronically Signed by Armanda Magic M.D. on 07/05/2010 10:03:17 PM

## 2010-07-27 NOTE — Discharge Summary (Signed)
Lauren Crosby NO.:  [id number]  MEDICAL RECORD NO.:  [id number]  LOCATION:  1412                         FACILITY:  [facility]  PHYSICIAN:  Ramiro Harvest, MD    DATE OF BIRTH:  30-Jan-1931  DATE OF ADMISSION:  06/25/2010 DATE OF DISCHARGE:  06/30/2010                        DISCHARGE SUMMARY    PRIMARY CARE PHYSICIAN:  Molly Maduro A. Nicholos Johns, M.D.  DATE OF ADMISSION:  June 25, 2010  DISCHARGE DIAGNOSES: 1. Atypical chest pain, resolved. 2. Acute gouty flare, improved. 3. Left knee end-stage degenerative joint disease. 4. Hypertension. 5. Hyperlipidemia. 6. Type 2 diabetes with a hemoglobin A1c of 7.7 on June 25, 2010. 7. Iron-deficiency anemia. 8. History of diverticulosis. 9. History of sigmoid colectomy secondary to diverticulitis. 10.Status post appendectomy. 11.Status post ventral hernia repair with complications of abdominal     wound, status post multiple I and D. 12.Status post cholecystectomy.DISCHARGE MEDICATIONS.: 1. Colcrys 0.6 mg p.o. daily. 2. Meloxicam 7.5 mg p.o. b.i.d. 3. Oxycodone 5 mg p.o. q.5 h p.r.n. pain. 4. Prednisone 40 mg p.o. daily x4 days, then stop. 5. Ferrex 150 mg p.o. b.i.d. 6. Humulin 70/30, 33 units in the morning and 60 units at night. 7. Losartan 100 mg p.o. q.a.m. 8. Metformin 1000 mg p.o. b.i.d. 9. Multivitamin 1 tablet p.o. daily. 10.Simvastatin 40 mg p.o. q.h.s.  DISPOSITION AND FOLLOWUP:  The patient will be discharged home with home health PT with weightbearing as tolerated with range of movement and with the rolling walker.  The patient is to follow up with PCP in 1 to 2 weeks post discharge.  On followup, the patient's left knee will need to be assessed for a gouty flare.  The patient will also need a CBC checked to follow up on H and H as well as a BMET checked to follow up on electrolytes and renal function.  Further diabetes management per the patient's PCP.  The patient had presented with atypical chest  pain and cardiac etiology has been ruled out.  If the patient has any further atypical chest pain, may benefit from an outpatient GI referral for further workup.  The patient's PCP will need to reassess the patient's gout.  Once her acute gouty flare has resolved, may consider placing the patient on daily allopurinol.  CONSULTATIONS DONE: 1. A cardiology consultation was done.  The patient was seen in     consultation by Dr. Carolanne Grumbling of [facility] on June 26, 2010. 2. Orthopedic consultation was done.  The patient was seen in     consultation by Dr. Carola Frost on June 28, 2010.  PROCEDURES PERFORMED: 1. Acute abdominal series was done on June 25, 2010, that showed no     acute findings, large colonic stool burden, suggest clinical     correlation for possible constipation, stable mild cardiomegaly.  X-     ray of the knee was done on June 27, 2010, that showed no acute     findings, marked tricompartmental osteoarthritis.  A 2-day Myoview     was done on June 28, 2010, that showed no perfusion defects, EF of     59%, wall motion was normal.  Also a 2-D  echo was done on June 26, 2010, that showed a normal left ventricular cavity size.  There was     mild concentric and moderate asymmetric hypertrophy, systolic     function was normal, EF of 55-60%, wall motion was normal.  There     was no regional wall motion abnormality, moderate thickening and     calcification of the aortic valves consistent with sclerosis,     calcified mitral valvular annulus, mild thickened leaflets, mild     regurgitation, atrial septum with increased thickness of the septum     consistent with lipomatous hypertrophy.  DISPOSITION AND FOLLOWUP:  The patient is to call to follow up with Dr. Carola Frost of Orthopedics in 3 to 4 weeks post discharge.  BRIEF ADMISSION HISTORY AND PHYSICAL:  [name] is a pleasant 77 year old Caucasian female presented to the ED with complaints of severe  intermittent chest pain which had been occurring since 5:00 p.m. on the day of admission.  The patient reported having 3 episodes, 2 before she arrived in the ED.  While in the ED, the patient rated the pain as 8 out of 10, described as being sharp in the mid chest area radiating to the back.  The patient denied any shortness of breath.  No nausea, no vomiting, no diaphoresis associated with the pain.  There was no radiation of the pain into arms, neck and face.  The patient reported in the past having a cardiac catheterization performed, stated that it was several years ago and did not remember which year.  The patient denies having any family history of coronary artery disease and the patient is not a smoker.  The patient however did have a medical history of hyperlipidemia, hypertension and diabetes.  For the rest of admission history and physical, please see H and P dictated by Dr. Lovell Sheehan of job number [id number].  HOSPITAL COURSE: 1. Chest pain.  The patient was admitted with chest pain secondary to     history of hypertension, hyperlipidemia and diabetes as well as     family history of coronary artery disease.  She was admitted for     chest pain rule out.  Cardiac enzymes were cycled which were     negative x3.  The patient was placed on nitroglycerin paste,     aspirin and oxygen.  She was monitored and followed, also placed on     Lovenox for DVT prophylaxis as well.  Cardiac enzymes which were     cycled were negative x3.  A 2-D echo was also obtained, the results     as stated above, which had EF of 55-60% with no regional wall     motion abnormalities.  A cardiology consultation was obtained.  The     patient was seen in consultation by Dr. Carolanne Grumbling on June 26, 2010, and it was felt that the patient's chest pain was likely     atypical.  It was recommended to get a 2-day nuclear stress test     which was ordered with results as stated above which were     essentially  unremarkable.  The patient's chest pain improved during     the hospitalization and resolved by day of discharge.  The patient     did not have any further chest pain and it was recommended that the     patient may be referred to outpatient GI as an outpatient for  further workup as the chest pain may have a GI component to it.     The patient will be discharged home in stable and improved     condition. 2. Acute gouty flare/left knee end-stage degenerative joint disease.     During the hospitalization,  the patient started to complain of     left knee pain and swelling.  X-rays of the knee were done on June 27, 2010, that showed marked tricompartmental osteoarthritis.  An     orthopedic consultation was done.  The patient was seen in     consultation by Dr. Carola Frost on June 28, 2010, and at that time it was     felt that the patient would benefit from a knee arthrocentesis.     The patient did have arthrocentesis done secondary to her effusion.     This was sent for Gram stain, culture and crystals.  The knee was     also injected with steroids and local anesthetic and this procedure     was done on June 28, 2010, at the bedside.  Synovial fluid results     did come back with a turbid appearance, [place]BCs of [id number], crystalexamination came back positive for intracellular monosodium, urate     crystals and extracellular monosodium urate crystals.  The patient     was placed on colchicine as well as steroids.  The steroids were     increased to 40 mg daily for total of 5 days.  She was also     maintained on colchicine as well.  The patient improved clinically     and will be discharged home on 4 more days of oral steroids as well     as daily Colcrys.  She will need to follow up with her PCP as an     outpatient.  Once her acute gouty flare has resolved, her PCP may     consider placing the patient on allopurinol daily.  The patient     will be discharged in stable and improved condition on  Colcrys once     daily as well as prednisone 40 mg daily for the next 4 days.  The     patient is also placed on Mobic for left knee end-stage DJD.  The     patient will follow up with Dr. Carola Frost as an outpatient for further     evaluation of her left knee osteoarthritis.  The patient will be     discharged home in stable and improved condition. 3. Iron-deficiency anemia.  The patient was noted to be anemic during     the hospitalization.  She did not have any overt GI bleed.  FOBT     which was done was negative.  The patient was maintained on home     dose of oral iron supplementation, was discharged home in stable     condition, will follow up with PCP as an outpatient. 4. Type 2 diabetes.  This remained stable during the hospitalization.     A1c which was checked came back at 7.7.  The patient was placed on     a sliding scale insulin during the hospitalization and will be     discharged home back on her home oral regimen.  The rest of the     patient's chronic medical issues have remained stable throughout     the hospitalization and the patient was discharged in stable and  improved condition.  On the day of discharge, vital signs, temperature 98.5, pulse of 65, respirations 16, blood pressure 106/65, satting 98% on room air.  DISCHARGE LABORATORY DATA:  FOBT is negative.  CMET with a sodium of 136, potassium 4.0, chloride 102, bicarb 24, glucose 108, BUN 18, creatinine 0.69, bilirubin 0.5, alk phosphatase 104, AST 24, ALT 25, protein 7.5, albumin 3.2, calcium of 8.9.  CBC, white count 5.6, hemoglobin 8.3, hematocrit 26.0 and a platelet count of 139.  It was a pleasure taking care of [name].     Ramiro Harvest, MD     DT/MEDQ  D:  06/30/2010  T:  06/30/2010  Job:  [id number]  cc:   Armanda Magic, M.D. Fax: [phone]  Doralee Albino. Carola Frost, M.D. Fax: [phone]  Elana Alm. Nicholos Johns, M.D. Fax: [phone]  Electronically Signed by Ramiro Harvest MD on 07/27/2010 06:20:32  PM

## 2010-07-28 NOTE — Consult Note (Signed)
Lauren Crosby, HANK NO.:  [id number]  MEDICAL RECORD NO.:  [id number]  LOCATION:  NUC                          FACILITY:  [facility]  PHYSICIAN:  Doralee Albino. Carola Frost, M.D. DATE OF BIRTH:  [birth year]  DATE OF CONSULTATION:  06/28/2010 DATE OF DISCHARGE:                                CONSULTATION   REQUESTING PHYSICIAN:  Hospitalist Service, Dr. Betti Cruz.  REASON FOR CONSULTATION:  Left knee pain and effusion.  PRIMARY CARE PHYSICIAN:  Robert A. Nicholos Johns, M.D.  BRIEF HISTORY OF PRESENT ILLNESS:  Ms. Steury is a 76 year old Caucasian female who initially presented to [facility] on June 17 with a chief complaint of chest pain.  She was seen, evaluated, and admitted to the medicine service.  Extensive workup was completed, which did not show any cardiac related cause to her chest pain, questioning GI- related issue.  After workup was found to be negative, the patient was scheduled to be discharged; however, she began to have excruciating left knee pain and difficulty ambulating, as such orthopedic consult was obtained.  The patient was seen in room 1412.  She is complaining of left knee pain 2 days in duration, significant difficulty ambulating. She does ambulate at home with the use of a cane.  She has also been in the process of trying to get herself plugged in with an orthopedic doctor in [facility] for evaluation for a total knee arthroplasty.  She did see Dr. Thomasena Edis in the past; however, secondary to that particular office not accepting him an insurance, patient opted to seek orthopedic services elsewhere.  The patient does report difficulty getting around in the last several months with increasing knee pain.  The patient also notes that her left lower extremity is chronically edematous as well.  The patient also reports a remote history of gout, which she had in her toes, but has not had any additional flares that she is aware of since that time.   The patient denies any numbness or tingling in her extremities.  No additional concerns are noted.  PAST MEDICAL HISTORY:  Notable for; 1. Hypertension. 2. Diabetes. 3. Dyslipidemia. 4. Diverticulosis with diverticulitis. 5. History of gout.  PAST SURGICAL HISTORY:  Notable for diverticulitis with sigmoid colectomy, appendectomy, ventral hernia repair, cholecystectomy as well. The patient has also had issues with urolithiasis in 2003, she did have stone manipulation for a calculus in the left uterovesical junction. Colporrhaphy, sacrospinous ligament suspension.  Also, status post multiple I and D for abdominal wall abscesses.  HOME MEDICATIONS: 1. Multivitamin. 2. Simvastatin 40 mg p.o. at bedtime. 3. Metformin 1000 mg 1 p.o. b.i.d. 4. Losartan 100 mg 1 p.o. in the morning. 5. Ferrex 150 mg 1 p.o. b.i.d. 6. Humulin 70/30, 33 units in the morning, 60 units in the evening.  ALLERGIES:  PENICILLIN.  SOCIAL HISTORY:  The patient is married, lives in [place] with her husband, has 6 children.  Nonsmoker, nondrinker, does not use illicit drugs.  She does ambulate with the assistance of a cane at home and out in the community as well.  FAMILY HISTORY:  Notable for hypertension, diabetes, CAD, rheumatoid arthritis.  REVIEW OF SYSTEMS:  As noted above  in the HPI.  PHYSICAL EXAMINATION:  VITAL SIGNS:  Temperature 98.0, heart rate 68, respirations 20 at 95% on room air, BP is 134/60. GENERAL:  The patient is awake, alert, in no acute distress.  She is sitting in a bedside chair upon my entrance into the room.  She is an obese white female as well.  She is somewhat hard of hearing. LUNGS:  Clear bilaterally. CARDIAC:  S1, S2 noted. ABDOMEN:  Positive bowel sounds. EXTREMITIES:  Focused examination of the left lower extremity, hip, and ankle without acute findings, she does have generalized edema to the left leg as well.  No tenderness to palpation about her hip or ankle  or foot.  Active motion of her ankle and toes is intact and without pain. Deep peroneal nerve, superficial peroneal nerve, tibial nerve, sensory function are intact.  EHL, FHL, anterior tibialis, posterior tibialis, peroneals, gastroc-soleus complex motor function are intact as well. Evaluation of her left knee, there is no erythema or open wounds to left knee.  There is a fairly moderate joint effusion appreciated with palpation.  The patient does have generalized tenderness to palpation of her knee.  She does permit moderate amount of range of motion from full extension to about 80 degrees of flexion, but does have pain throughout, but not out of proportion.  No deep calf tenderness is noted.  No instability is appreciated at the knee as well.  Right lower extremity and bilateral upper extremities are essentially unremarkable for any musculoskeletal findings.  However, her right lower extremity does exhibit some same swelling as the left.  PERTINENT LABORATORY DATA:  Sodium 136, potassium 4.0, chloride 102, bicarb 24, BUN 18, creatinine 0.69, glucose 108.  CBC; hemoglobin 8.3, hematocrit 26.0, platelets 139, white blood cells 5.6.  Again, cardiac panel negative.  X-RAYS:  Multiple views of the left knee demonstrates severe end-stage tricompartmental DJD, medial compartmental worse than the lateral. There is squaring off both her tibial condyles with spurring osteophyte noted laterally as well as significant osteophyte formation of the superior aspect of her patella.  ASSESSMENT AND PLAN:  A 77 year old female with left knee effusion, end- stage degenerative joint disease, left knee. 1. Left knee effusion and pain.  We will perform knee arthrocentesis     at the bedside and send fluid for Gram stain, culture, crystal, and     cell count.  If fluid is clear, no concern for infection is     present, I will inject knee with steroids and local anesthetic.     The patient will likely be  range of motion as tolerated as     weightbearing as tolerated on the left lower extremity with PT     consult, Ace, ice, and elevate as well.  The patient will likely     need orthopedic referral for total joint surgery. 2. History of gout possibly contributing to the left knee effusion and     pain.  May need dietary modification and control meds after type     determine if true gout. 3. Continue per Internal Medicine, disposition, arthrocentesis at     bedside, PT consult.   ORTHO PROCEDURE NOTE.  PREPROCEDURE DIAGNOSIS:  Left knee effusion and end-stage degenerative joint disease.  POSTPROCEDURE DIAGNOSIS:  Effusion secondary to gout versus degenerative joint disease.  PROCEDURE:  Left knee arthrocentesis.  ASSISTANT:  Mearl Latin, PA  COMPLICATIONS:  None.  SPECIMENS:  One 20 cc syringe with straw-colored fluid sent for Gram stain, anaerobic,  aerobic culture, cell count with differential, crystal analysis.  Total fluid collected 52 cc of straw-colored fluid.  BRIEF SUMMARY OF PROCEDURE:  I discussed the risks and benefits with Ms. Gadson about the procedure.  She did elect to proceed.  Lateral aspect of her knee was identified approximately 1 cm posterior and 1 cm superior to the proximal aspect of the superior patellar pole.  This area was cleaned with alcohol and then Betadine swabs were applied x3. After this, 4 cc of 1% lidocaine was infiltrated into the subcutaneous and dermal tissue to achieve anesthesia of the soft tissue.  Once adequate anesthesia was obtained, an 18-gauge needle on a 20 cc syringe was used to infiltrate the left knee joint from the lateral aspect. Arthrocentesis was smooth,  no significant resistance was encountered. In total, 52 cc of straw-colored fluid was obtained from the knee. After fluid was removed from the knee, 5 cc syringe containing 2 cc of 1% lidocaine, 2 cc of 0.25% Marcaine, and 1 cc of 40 mg/mL of Depo- Medrol was then  infiltrated into the knee joint.  After this, needle was removed, area was cleaned and Band-Aid was applied followed by an Ace wrap.  The patient tolerated the procedure very well, did have pretty good relief, almost immediately as well.  DISPOSITION:  Specimens were sent down to lab for evaluation.  The patient tolerated the procedure well.  The patient will be weightbearing as tolerated as well as range of motion as tolerated with therapy, we will follow up labs.  The patient can follow up with Orthopedics in 2-3 week's time to evaluate her progress.  Again, I suspect the patient will need a total joint referral for future total joint replacement. However, it is possible that this acute exacerbation of knee pain may be secondary to gout; if that is the case, and she truly does have gout, she should be referred to rheumatologist for a more complete screening and evaluation of her gout as well as gout education and possibly being put on control medications.     Mearl Latin, PA   ______________________________ Doralee Albino. Carola Frost, M.D.    KWP/MEDQ  D:  06/28/2010  T:  06/29/2010  Job:  [id number]  Electronically Signed by Montez Morita PA on 07/01/2010 11:32:02 AM Electronically Signed by Myrene Galas M.D. on 07/28/2010 06:33:23 PM

## 2010-07-28 NOTE — Op Note (Signed)
  NAMEADDISEN, Lauren Crosby NO.:  [id number]  MEDICAL RECORD NO.:  [id number]  LOCATION:  [facility]                          FACILITY:  [facility]  PHYSICIAN:  Mearl Latin, PA       DATE OF BIRTH:  1931-05-23  DATE OF PROCEDURE:  06/28/2010 DATE OF DISCHARGE:                              OPERATIVE REPORT   PREPROCEDURE DIAGNOSES:  Left knee effusion and end-stage degenerative joint disease.  POSTPROCEDURE DIAGNOSES:  Left knee effusion and end-stage degenerative joint disease.  Effusion secondary to gout versus degenerative joint disease.  PROCEDURE:  Left knee arthrocentesis.  ASSISTANT:  Mearl Latin, PA  COMPLICATIONS:  None.  SPECIMENS:  One 20 cc syringe with straw-colored fluid sent for Gram stain, anaerobic, aerobic culture, cell count with differential, crystal analysis.  Total fluid collected 52 cc of straw-colored fluid.  BRIEF SUMMARY OF PROCEDURE:  I discussed the risks and benefits with Ms. Few about the procedure.  She did elect to proceed.  Lateral aspect of her knee was identified approximately 1 cm posterior and 1 cm superior to the proximal aspect of the superior patellar pole.  This area was cleaned with alcohol and then Betadine swabs were applied x3. After this, 4 cc of 1% lidocaine was infiltrated into the subcutaneous and dermal tissue to achieve anesthesia of the soft tissue.  Once adequate anesthesia was obtained, an 18-gauge needle on a 20 cc syringe was used to infiltrate the left knee joint from the lateral aspect. Arthrocentesis was smooth,  no significant resistance was encountered. In total, 52 cc of straw-colored fluid was obtained from the knee. After fluid was removed from the knee, 5 cc syringe containing 2 cc of 1% lidocaine, 2 cc of 0.25% Marcaine, and 1 cc of 40 mg/mL of Depo- Medrol was then infiltrated into the knee joint.  After this, needle was removed, area was cleaned and Band-Aid was applied followed by an Ace wrap.   The patient tolerated the procedure very well, did have pretty good relief, almost immediately as well.  DISPOSITION:  Specimens were sent down to lab for evaluation.  The patient tolerated the procedure well.  The patient will be weightbearing as tolerated as well as range of motion as tolerated with therapy, we will follow up labs.  The patient can follow up with Orthopedics in 2-3 week's time to evaluate her progress.  Again, I suspect the patient will need a total joint referral for future total joint replacement. However, it is possible that this acute exacerbation of knee pain may be secondary to gout; if that is the case, and she truly does have gout, she should be referred to rheumatologist for a more complete screening and evaluation of her gout as well as gout education and possibly being put on control medications.     Mearl Latin, PA     KWP/MEDQ  D:  06/28/2010  T:  06/29/2010  Job:  [id number]  Electronically Signed by Montez Morita PA on 07/01/2010 11:32:13 AM Electronically Signed by Myrene Galas M.D. on 07/28/2010 06:33:37 PM

## 2010-08-01 NOTE — H&P (Signed)
NAMEAUDREA, Lauren Crosby NO.:  [id number]  MEDICAL RECORD NO.:  [id number]  LOCATION:  1412                         FACILITY:  [facility]  PHYSICIAN:  Della Goo, M.D. DATE OF BIRTH:  03-12-1931  DATE OF ADMISSION:  06/26/2010 DATE OF DISCHARGE:                             HISTORY & PHYSICAL   ADMISSION DATE:  06/26/2010  PRIMARY CARE PHYSICIAN:  Molly Maduro A. Nicholos Johns, M.D., of [facility]  CHIEF COMPLAINT:  Chest and back pain.  HISTORY OF PRESENT ILLNESS:  This is a 77 year old female who presents to the emergency department with complaints of severe intermittent chest pain which has been occurring since about 5 p.m.  She reports having 3 episodes, 2 before she arrived at the emergency department and 1 while she was in the emergency department.  She rates the pain as being an 8/10 at the worst and describes it as being sharp in the midchest area radiating into the back.  She denies having any shortness of breath, nausea, vomiting, or diaphoresis associated with the pain.  There is no radiation of the pain into the arms, neck, or face.  The patient reports in the past having a cardiac catheterization performed, she says this was years ago, she does not remember which year.  She denies having any family history of coronary artery disease and she is not a smoker.  The patient does, however, have a medical history of hyperlipidemia, hypertension, and diabetes.  PAST MEDICAL HISTORY:  Also, includes diverticulosis and a history of diverticulitis in the past.  PAST SURGICAL HISTORY:  History of surgery for diverticulitis which included sigmoid colectomy and appendectomy and she also had a ventral hernia repair with complications of an abdominal wound status post multiple I and Ds performed and also a cholecystectomy.  MEDICATIONS:  Include: 1. Humulin 70/30 insulin 33 units subcu q.a.m., 60 units subcu q.h.s.. 2. Metformin 1000 mg p.o. b.i.d. 3. Simvastatin  40 mg p.o. q.h.s. 4. Losartan 100 mg p.o. daily. 5. Ferrex 150 one tablet p.o. b.i.d. 6. Centrum Silver 1 tablet p.o. daily.  ALLERGIES:  PENICILLIN which causes hives.  SOCIAL HISTORY:  The patient is married with 6 children.  She is a nonsmoker, nondrinker.  No history of illicit drug usage.  FAMILY HISTORY:  Positive for hypertension in her mother, diabetic disease in her father.  No history of coronary artery disease or cancer in her family.  REVIEW OF SYSTEMS:  Pertinents mentioned above.  All other organ systems are negative.  PHYSICAL EXAMINATION FINDINGS:  GENERAL:  This is a 77 year old morbidly obese Caucasian female who is in no visible discomfort or acute distress currently. VITAL SIGNS:  Temperature 97.7, blood pressure 154/76, heart rate 71, respirations 20, O2 sats 97% to 100%. HEENT EXAMINATION:  Normocephalic, atraumatic.  Pupils equally round, reactive to light.  Extraocular movements are intact.  Funduscopic benign.  There is no scleral icterus.  Nares are patent bilaterally. Oropharynx is clear.NECK:  Supple, full range of motion.  No thyromegaly, adenopathy, or jugular venous distention. CARDIOVASCULAR:  Regular rate and rhythm.  Normal S1, S2.  No murmurs, gallops, or rubs appreciated. LUNGS:  Clear to auscultation bilaterally.  No rales, rhonchi, or wheezes.  Chest wall nontender.  No palpable masses.  No unusual lesions.  Chest wall excursion is symmetric.  Breathing is unlabored. ABDOMEN:  Obese, positive bowel sounds, soft, nontender, nondistended. No hepatosplenomegaly. EXTREMITIES:  Without cyanosis or clubbing.  She does have 2+ edema to the pretibial areas of both legs. NEUROLOGIC EXAMINATION:  Nonfocal.  LABORATORY STUDIES:  White blood cell count 5.4, hemoglobin 9.1, hematocrit 28.4, MCV 64.0, platelets 170, neutrophils 54%, lymphocytes 37%.  Sodium 137, potassium 4.0, chloride 100, CO2 of 26, BUN 28, creatinine 0.85, glucose 157, and lipase  33.  Acute abdominal series reveals mild cardiomegaly, no acute abdominal findings, large colonic stool burden seen.  EKG reveals a normal sinus rhythm.  No acute ST- segment changes are seen. The cardiac enzymes are still pending at this time.  ASSESSMENT:  77 year old female being admitted with: 1. Chest pain. 2. Hypertension. 3. Dyslipidemia. 4. Type 2 diabetes mellitus. 5. Morbid obesity. 6. Anemia.  PLAN:  The patient will be admitted to 23-hour observation to a telemetry bed for cardiac monitoring.  Cardiac enzymes will continue to be performed.  The patient will be placed on nitro paste, aspirin, and oxygen therapies.  Her regular medications will be further reconciled and continued except for her metformin therapy at this time in the event the patient needs contrast studies performed.  Sliding scale insulin coverage has also been ordered p.r.n. elevated blood sugars and the patient will be placed on Lovenox therapy for DVT prophylaxis.  The patient is a full code at this time.  Further workup will ensue pending results of the patient's clinical course and her studies.     Della Goo, M.D. HJ/MEDQ  D:  06/26/2010  T:  06/26/2010  Job:  [id number]  cc:   Molly Maduro A. Nicholos Johns, M.D. Fax: [phone]  Electronically Signed by Della Goo M.D. on 08/01/2010 11:12:57 AM

## 2010-10-27 LAB — COMPREHENSIVE METABOLIC PANEL
ALT: 23
AST: 29
Albumin: 3.6
Alkaline Phosphatase: 107
Calcium: 9.3
GFR calc Af Amer: >60
Glucose, Bld: 130 — ABNORMAL HIGH
Potassium: 5.2 — ABNORMAL HIGH
Sodium: 137
Total Protein: 8.5 — ABNORMAL HIGH

## 2010-10-27 LAB — CBC
MCHC: 31.3
Platelets: 278
RDW: 19.5 — ABNORMAL HIGH

## 2010-10-27 LAB — ANAEROBIC CULTURE

## 2010-10-27 LAB — CULTURE, ROUTINE-ABSCESS

## 2011-03-17 HISTORY — PX: DIAGNOSTIC MAMMOGRAM: HXRAD719

## 2011-10-13 ENCOUNTER — Encounter: Payer: Self-pay | Admitting: Internal Medicine

## 2011-12-01 ENCOUNTER — Telehealth: Payer: Self-pay | Admitting: Internal Medicine

## 2011-12-01 NOTE — Telephone Encounter (Signed)
Pt scheduled to see Dr. Marina Goodell 12/12/11@10 :15am. Malachi Bonds to send records and notify pt of appt date and time.

## 2011-12-12 ENCOUNTER — Ambulatory Visit: Payer: Medicare HMO | Admitting: Internal Medicine

## 2012-02-08 ENCOUNTER — Telehealth (INDEPENDENT_AMBULATORY_CARE_PROVIDER_SITE_OTHER): Payer: Self-pay | Admitting: General Surgery

## 2012-02-08 NOTE — Telephone Encounter (Signed)
Spoke to Lauren Crosby in medical records and requested a copy of the most recent notes on this patient. She stated the patient was seen on 01/31/12, labs were in December 2013. She stated she would send the last two office visit notes for Dr. Derrell Lolling to review. Advised the patient has an appointment to be seen 02/09/12 at 2:00.

## 2012-02-09 ENCOUNTER — Encounter (INDEPENDENT_AMBULATORY_CARE_PROVIDER_SITE_OTHER): Payer: Self-pay | Admitting: General Surgery

## 2012-02-09 ENCOUNTER — Ambulatory Visit (INDEPENDENT_AMBULATORY_CARE_PROVIDER_SITE_OTHER): Payer: Medicare HMO | Admitting: General Surgery

## 2012-02-09 VITALS — BP 155/100 | HR 80 | Temp 98.6°F | Resp 20 | Ht 63.0 in | Wt 229.2 lb

## 2012-02-09 DIAGNOSIS — S31109A Unspecified open wound of abdominal wall, unspecified quadrant without penetration into peritoneal cavity, initial encounter: Secondary | ICD-10-CM

## 2012-02-09 DIAGNOSIS — T8189XA Other complications of procedures, not elsewhere classified, initial encounter: Secondary | ICD-10-CM | POA: Insufficient documentation

## 2012-02-09 NOTE — Patient Instructions (Signed)
I pulled a large Prolene suture out of the  wound today. 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 It is possible this may completely heal on its own now  Take the  doxycycline antibiotics for 10 days as prescribed  Keep a dry gauze bandage on the wound. Do not put any ointments or creams on this.  Go ahead and schedule your colonoscopy with Dr. Marina Goodell , it has been more than 5 years.  Return to see Dr. Derrell Lolling in 2 months.

## 2012-02-09 NOTE — Progress Notes (Signed)
Patient ID: Lauren Crosby, female   DOB: 16-Jun-1931, 79 y.o.   MRN: [id number]  Chief Complaint  Patient presents with  . Wound Infection    HPI MACRINA LEHNERT is a 79 y.o. female.  She is referred back to me by Dr. Lindwood Coke for management of chronic abdominal wound drainage.  This patient has a long surgical history. In 1993  she underwent sigmoid colectomy and appendectomy for diverticulitis. In 2001 she had a laparoscopic cholecystectomy. In 2007 Dr. Orson Slick operated on her for an incarcerated ventral hernia. He opened the wound and found a small inflamed perforation of the transverse colon which was debrided and primarily closed and the hernia was primarily repaired with Prolene sutures. On 06/14/2006 Dr. Carolynne Edouard took her to the operating room for incision and drainage of abdominal wall abscess. He found suture granulomas and removed further sutures. On 06/01/2009  I took her to the operating room and debrided skin subcutaneous tissue and fascia and removed further Prolene sutures.  It was thought that she had healed, but according to her she has had intermittent drainage from the wound ever since. She says that it will scab over and stay dry for a while and then it will drain and get  a large spot on her bandage. No change in gastrointestinal habits.  Last colonoscopy was December 2008 by Dr. Marina Goodell. She states she had polyps and he told her that she needed another colonoscopy in 5 years. She has not scheduled that yet but knows that she needs to.  Comorbidities include morbid obesity, insulin-dependent diabetes, hypertension, hypothyroidism.  She is here with her husband and is in no distress today. HPI  Past Medical History  Diagnosis Date  . Colon polyp     adenomatous  . Hypertension   . Hyperlipemia   . Adrenal adenoma   . Thalassemia minor   . Gout   . Hernia   . Thalassemia minor   . Goiter   . Diabetes     type 2  . 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 Retinopathy due to secondary diabetes mellitus   . Nephrolithiasis   . Open wound of abdominal wall, anterior, without mention of complication   . Cellulitis and abscess of trunk     Past Surgical History  Procedure Date  . Cholecystectomy   . Sigmoid resection / rectopexy   . Ventral hernia repair 2007  . Total abdominal hysterectomy   . Rotator cuff repair   . Cardiac catheterization 2008    conclusion: smooth and normal coronary arteries, normal left ventricular systolic function  . Colonoscopy 10/04/06  . Diagnostic mammogram 03/17/2011  . Appendectomy     Family History  Problem Relation Age of Onset  . Diabetes    . Hypertension    . Diabetes Father   . Hypertension Father     Social History History  Substance Use Topics  . Smoking status: Never Smoker   . Smokeless tobacco: Never Used  . Alcohol Use: No    Allergies  Allergen Reactions  . Penicillins     Current Outpatient Prescriptions  Medication Sig Dispense Refill  . furosemide (LASIX) 40 MG tablet Take 40 mg by mouth daily.      . insulin NPH-insulin regular (NOVOLIN 70/30) (70-30) 100 UNIT/ML injection Inject into the skin. 46 units q am, 60 units q pm      . iron polysaccharides (NIFEREX) 150 MG capsule Take 150 mg by mouth 2 (  two) times daily.      Marland Kitchen losartan (COZAAR) 100 MG tablet Take 100 mg by mouth daily.      . metFORMIN (GLUCOPHAGE) 1000 MG tablet Take 1,000 mg by mouth 2 (two) times daily with a meal.      . Multiple Vitamins-Minerals (MULTIVITAMIN PO) Take 1 tablet by mouth daily.      . simvastatin (ZOCOR) 80 MG tablet Take 80 mg by mouth at bedtime.      . TRUETEST TEST test strip         Review of Systems Review of Systems  Constitutional: Negative for fever, chills and unexpected weight change.  HENT: Negative for hearing loss, congestion, sore throat, trouble swallowing and voice change.   Eyes: Negative for visual disturbance.  Respiratory: Positive for cough. Negative  for wheezing.   Cardiovascular: Negative for chest pain, palpitations and leg swelling.  Gastrointestinal: Positive for constipation. Negative for nausea, vomiting, abdominal pain, diarrhea, blood in stool, abdominal distention and anal bleeding.  Genitourinary: Negative for hematuria, vaginal bleeding and difficulty urinating.       Nocturia  Musculoskeletal: Positive for myalgias, joint swelling and arthralgias.  Skin: Negative for rash and wound.  Neurological: Negative for seizures, syncope and headaches.  Hematological: Negative for adenopathy. Does not bruise/bleed easily.  Psychiatric/Behavioral: Negative for confusion.    Blood pressure 155/100, pulse 80, temperature 98.6 F (37 C), temperature source Temporal, resp. rate 20, height 5\' 3"  (1.6 m), weight 229 lb 3.2 oz (103.964 kg).  Physical Exam Physical Exam  Constitutional: She is oriented to person, place, and time. She appears well-developed and well-nourished. No distress.       Weight 229 lbs.  HENT:  Head: Normocephalic and atraumatic.  Nose: Nose normal.  Mouth/Throat: No oropharyngeal exudate.  Eyes: Conjunctivae normal and EOM are normal. Pupils are equal, round, and reactive to light. Left eye exhibits no discharge. No scleral icterus.  Neck: Neck supple. No JVD present. No tracheal deviation present. No thyromegaly present.  Cardiovascular: Normal rate, regular rhythm, normal heart sounds and intact distal pulses.   No murmur heard. Pulmonary/Chest: Effort normal and breath sounds normal. No respiratory distress. She has no wheezes. She has no rales. She exhibits no tenderness.  Abdominal: Soft. Bowel sounds are normal. She exhibits no distension and no mass. There is no tenderness. There is no rebound and no guarding.       Obese. Trocar sites well healed. Midline scar. No recurrent hernia noted. Small eschar noted on the wound. This was removed easily. I explored  a small open area and found a huge Prolene suture  which pulled up and completely removed. I probed the wound and found there was no purulence. There was essentially a minimal cellulitis with an  area of erythema only about 5 mm in diameter around the wound. Dressed with dry gauze.  Musculoskeletal: She exhibits edema. She exhibits no tenderness.  Lymphadenopathy:    She has no cervical adenopathy.  Neurological: She is alert and oriented to person, place, and time. She exhibits normal muscle tone. Coordination normal.  Skin: Skin is warm. No rash noted. She is not diaphoretic. No erythema. No pallor.  Psychiatric: She has a normal mood and affect. Her behavior is normal. Judgment and thought content normal.    Data Reviewed Care discussed with Dr. Evlyn Kanner by phone. I reviewed Dr. Rinaldo Cloud office records.  Assessment    Recurrent suture granuloma of abdominal wall. Sutures removed today. Hopefully this will allow complete wound  healing. If not, she will eventually need wound exploration and debridement.  History MRSA wound infection  History multiple abdominal surgeries, outlined above. There is apparently no mesh present in the wound.  Morbid obesity  Insulin-dependent diabetes mellitus  Hypertension  Hypothyroidism  History of colonic polyps, overdue for colonoscopy    Plan    I spent a long time discussing my clinical impression with the patient and her husband. I told her that if this healed primarily then nothing further needed to be done, but if not, the wound might need operative exploration  Doxycycline 100 mg by mouth twice a day x 10 days  She was advised to go ahead and schedule for colonoscopy  Return to see me in 2 months, sooner if there are any problems.       Angelia Mould. Derrell Lolling, M.D., [facility] General and Minimally invasive Surgery Breast and Colorectal Surgery Office:   [phone] Pager:   [phone]  02/09/2012, 2:14 PM

## 2012-02-14 ENCOUNTER — Encounter (INDEPENDENT_AMBULATORY_CARE_PROVIDER_SITE_OTHER): Payer: Self-pay

## 2012-04-04 ENCOUNTER — Encounter (INDEPENDENT_AMBULATORY_CARE_PROVIDER_SITE_OTHER): Payer: Medicare HMO | Admitting: General Surgery

## 2012-05-08 ENCOUNTER — Encounter: Payer: Self-pay | Admitting: Internal Medicine

## 2012-08-14 ENCOUNTER — Encounter: Payer: Self-pay | Admitting: Cardiology

## 2012-08-14 ENCOUNTER — Ambulatory Visit (INDEPENDENT_AMBULATORY_CARE_PROVIDER_SITE_OTHER): Payer: Medicare HMO | Admitting: Cardiology

## 2012-08-14 VITALS — BP 150/70 | HR 85 | Wt 229.0 lb

## 2012-08-14 DIAGNOSIS — I10 Essential (primary) hypertension: Secondary | ICD-10-CM

## 2012-08-14 DIAGNOSIS — E785 Hyperlipidemia, unspecified: Secondary | ICD-10-CM

## 2012-08-14 DIAGNOSIS — R079 Chest pain, unspecified: Secondary | ICD-10-CM | POA: Insufficient documentation

## 2012-08-14 DIAGNOSIS — R0989 Other specified symptoms and signs involving the circulatory and respiratory systems: Secondary | ICD-10-CM

## 2012-08-14 DIAGNOSIS — R0609 Other forms of dyspnea: Secondary | ICD-10-CM

## 2012-08-14 DIAGNOSIS — R0600 Dyspnea, unspecified: Secondary | ICD-10-CM

## 2012-08-14 DIAGNOSIS — Z01810 Encounter for preprocedural cardiovascular examination: Secondary | ICD-10-CM | POA: Insufficient documentation

## 2012-08-14 LAB — BASIC METABOLIC PANEL WITH GFR
BUN: 28 mg/dL — ABNORMAL HIGH (ref 6–23)
CO2: 25 mEq/L (ref 19–32)
Chloride: 101 mEq/L (ref 96–112)
Glucose, Bld: 250 mg/dL — ABNORMAL HIGH (ref 70–99)
Potassium: 4.8 mEq/L (ref 3.5–5.3)

## 2012-08-14 NOTE — Progress Notes (Signed)
HPI: 79 yo female for evaluation of dyspnea and preop for knee repacement. Cardiac cath in Jan 2005 showed normal LV function and normal coronaries. PFTs in 2011 showed moderately decreased DLCO but no obstructive disease. Echo in June 2011 showed normal LV function, moderate LVH, diastolic dysfunction, trace MR and TR. VQ 10/11 showed no pulmonary embolus. Nuclear study in June 2012 showed EF 59 and normal perfusion. Patient has had dyspnea on exertion for years. She denies orthopnea or PND. She has chronic pedal edema that worsens during the day and improves overnight. She has had chest pain intermittently for years as well.  It is in the left chest area and not related to exertion. It lasts several minutes and resolve spontaneously. No associated symptoms. She is having difficulties with knee arthralgias which is limiting her mobility. She has been told she may not be a surgical candidate because of her cardiac condition. Cardiology is asked to evaluate.  Current Outpatient Prescriptions  Medication Sig Dispense Refill  . insulin NPH-insulin regular (NOVOLIN 70/30) (70-30) 100 UNIT/ML injection Inject into the skin. 50 units q am, 65 units q pm      . iron polysaccharides (NIFEREX) 150 MG capsule Take 150 mg by mouth 2 (two) times daily.      Marland Kitchen losartan (COZAAR) 100 MG tablet Take 100 mg by mouth daily.      . metFORMIN (GLUCOPHAGE) 1000 MG tablet Take 1,000 mg by mouth 2 (two) times daily with a meal.      . Multiple Vitamins-Minerals (MULTIVITAMIN PO) Take 1 tablet by mouth daily.      . simvastatin (ZOCOR) 80 MG tablet Take 80 mg by mouth at bedtime.      . TRUETEST TEST test strip        No current facility-administered medications for this visit.    Allergies  Allergen Reactions  . Penicillins     Past Medical History  Diagnosis Date  . Colon polyp     adenomatous  . Hypertension   . Hyperlipemia   . Adrenal adenoma   . Gout   . Hernia   . Thalassemia minor   . Goiter   .  Diabetes     type 2  . 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 Retinopathy due to secondary diabetes mellitus   . Nephrolithiasis   . Open wound of abdominal wall, anterior, without mention of complication   . Cellulitis and abscess of trunk   . Diverticulitis     Past Surgical History  Procedure Laterality Date  . Cholecystectomy    . Sigmoid resection / rectopexy    . Ventral hernia repair  2007  . Total abdominal hysterectomy    . Rotator cuff repair    . Cardiac catheterization  2008    conclusion: smooth and normal coronary arteries, normal left ventricular systolic function  . Colonoscopy  10/04/06  . Diagnostic mammogram  03/17/2011  . Appendectomy    . Tonsillectomy      History   Social History  . Marital Status: Married    Spouse Name: N/A    Number of Children: 6  . Years of Education: N/A   Occupational History  . Not on file.   Social History Main Topics  . Smoking status: Never Smoker   . Smokeless tobacco: Never Used  . Alcohol Use: No  . Drug Use: No  . Sexually Active: Not on file   Other Topics Concern  . Not on file  Social History Narrative  . No narrative on file    Family History  Problem Relation Age of Onset  . Diabetes    . Hypertension    . Diabetes Father   . Hypertension Father     ROS: knee arthralgias but no fevers or chills, productive cough, hemoptysis, dysphasia, odynophagia, melena, hematochezia, dysuria, hematuria, rash, seizure activity, orthopnea, PND,  claudication. Remaining systems are negative.  Physical Exam:   Blood pressure 150/70, pulse 85, weight 229 lb (103.874 kg).  General:  Well developed/obese in NAD Skin warm/dry Patient not depressed No peripheral clubbing Back-normal HEENT-normal/normal eyelids Neck supple/normal carotid upstroke bilaterally; no bruits; no JVD; no thyromegaly chest - CTA/ normal expansion CV - RRR/normal S1 and S2; no murmurs, rubs or gallops;  PMI  nondisplaced Abdomen -NT/ND, no HSM, no mass, + bowel sounds, no bruit, previous abdominal surgery 2+ femoral pulses, no bruits Ext-no edema, chords, 1+ DP Neuro-grossly nonfocal  ECG sinus rhythm at a rate of 85. Cannot rule out prior septal infarct. Low voltage. Right axis deviation.

## 2012-08-14 NOTE — Patient Instructions (Addendum)
Your physician recommends that you schedule a follow-up appointment in: AS NEEDED PENDING TEST RESULTS  Your physician has requested that you have an echocardiogram. Echocardiography is a painless test that uses sound waves to create images of your heart. It provides your doctor with information about the size and shape of your heart and how well your heart's chambers and valves are working. This procedure takes approximately one hour. There are no restrictions for this procedure.   Your physician has requested that you have a lexiscan myoview. For further information please visit [email or web address] Please follow instruction sheet, as given.   Your physician recommends that you HAVE LAB WORK TODAY

## 2012-08-14 NOTE — Assessment & Plan Note (Signed)
Continue statin. 

## 2012-08-14 NOTE — Assessment & Plan Note (Signed)
Symptoms atypical. Plan nuclear study for risk stratification. 

## 2012-08-14 NOTE — Assessment & Plan Note (Signed)
Continue present blood pressure medications. 

## 2012-08-14 NOTE — Assessment & Plan Note (Signed)
Patient has been told previously she is not a surgical candidate because of her dyspnea. Previous cardiac testing has been essentially unrevealing. She continues to have dyspnea and chest pain. I will plan to proceed with an echocardiogram to assess LV function and a nuclear study to exclude ischemia. I will also check a BNP. If the studies are unremarkable then I think she can proceed with knee replacement from a cardiac standpoint.

## 2012-08-14 NOTE — Assessment & Plan Note (Signed)
This is long-standing.etiology unclear. Previous cardiac evaluation negative other than diastolic dysfunction. There may be a component of obesity and deconditioning. Plan repeat echocardiogram to quantify LV function and to assess diastolic function. Nuclear study to exclude ischemia. Check BNP.

## 2012-08-15 ENCOUNTER — Encounter (HOSPITAL_COMMUNITY): Payer: Medicare HMO

## 2012-08-19 ENCOUNTER — Telehealth: Payer: Self-pay | Admitting: Cardiology

## 2012-08-19 NOTE — Telephone Encounter (Signed)
Murphy/Wainer pre-operative clearance request  Office note from 8.6.2014 faxed to [phone] on 8.7.14 at 12:00 pm/djc

## 2012-08-21 ENCOUNTER — Ambulatory Visit (HOSPITAL_COMMUNITY): Payer: Medicare HMO | Attending: Cardiology | Admitting: Radiology

## 2012-08-21 DIAGNOSIS — R0989 Other specified symptoms and signs involving the circulatory and respiratory systems: Secondary | ICD-10-CM

## 2012-08-21 DIAGNOSIS — R079 Chest pain, unspecified: Secondary | ICD-10-CM

## 2012-08-21 MED ORDER — TECHNETIUM TC 99M SESTAMIBI GENERIC - CARDIOLITE
33.0000 | Freq: Once | INTRAVENOUS | Status: AC | PRN
  Administered 2012-08-21: 33 via INTRAVENOUS

## 2012-08-22 ENCOUNTER — Ambulatory Visit (HOSPITAL_BASED_OUTPATIENT_CLINIC_OR_DEPARTMENT_OTHER): Payer: Medicare HMO | Admitting: Radiology

## 2012-08-22 ENCOUNTER — Ambulatory Visit (HOSPITAL_COMMUNITY): Payer: Medicare HMO | Attending: Cardiovascular Disease | Admitting: Radiology

## 2012-08-22 VITALS — BP 158/69 | Ht 62.0 in | Wt 226.0 lb

## 2012-08-22 DIAGNOSIS — R0600 Dyspnea, unspecified: Secondary | ICD-10-CM

## 2012-08-22 DIAGNOSIS — R0989 Other specified symptoms and signs involving the circulatory and respiratory systems: Secondary | ICD-10-CM | POA: Insufficient documentation

## 2012-08-22 DIAGNOSIS — I10 Essential (primary) hypertension: Secondary | ICD-10-CM | POA: Insufficient documentation

## 2012-08-22 DIAGNOSIS — R072 Precordial pain: Secondary | ICD-10-CM

## 2012-08-22 DIAGNOSIS — Z794 Long term (current) use of insulin: Secondary | ICD-10-CM | POA: Insufficient documentation

## 2012-08-22 DIAGNOSIS — R0602 Shortness of breath: Secondary | ICD-10-CM

## 2012-08-22 DIAGNOSIS — R0789 Other chest pain: Secondary | ICD-10-CM | POA: Insufficient documentation

## 2012-08-22 DIAGNOSIS — E119 Type 2 diabetes mellitus without complications: Secondary | ICD-10-CM | POA: Insufficient documentation

## 2012-08-22 DIAGNOSIS — R079 Chest pain, unspecified: Secondary | ICD-10-CM

## 2012-08-22 DIAGNOSIS — R0609 Other forms of dyspnea: Secondary | ICD-10-CM | POA: Insufficient documentation

## 2012-08-22 DIAGNOSIS — R42 Dizziness and giddiness: Secondary | ICD-10-CM | POA: Insufficient documentation

## 2012-08-22 DIAGNOSIS — R109 Unspecified abdominal pain: Secondary | ICD-10-CM | POA: Insufficient documentation

## 2012-08-22 MED ORDER — REGADENOSON 0.4 MG/5ML IV SOLN
0.4000 mg | Freq: Once | INTRAVENOUS | Status: AC
  Administered 2012-08-22: 0.4 mg via INTRAVENOUS

## 2012-08-22 MED ORDER — TECHNETIUM TC 99M SESTAMIBI GENERIC - CARDIOLITE
33.0000 | Freq: Once | INTRAVENOUS | Status: AC | PRN
  Administered 2012-08-22: 33 via INTRAVENOUS

## 2012-08-22 NOTE — Progress Notes (Signed)
[facility] SITE 3 NUCLEAR MED [address] [place], [place] [address] [phone]    Cardiology Nuclear Med Study  Lauren Crosby is a 79 y.o. female     MRN : [id number]     DOB: 11-20-1931  Procedure Date: 08/22/2012  Nuclear Med Background Indication for Stress Test:  Evaluation for Ischemia and Surgical Clearance History:  2005 Heart Cath: Nl Coronaries, 06/2009, ECHO: NL mod LVH DD trace MR, TR, 06/2010 MPS: Nl EF: 59% Cardiac Risk Factors: Hypertension, IDDM Type 2 and Lipids  Symptoms:  Chest Pain, DOE and SOB   Nuclear Pre-Procedure Caffeine/Decaff Intake:  None NPO After: 5:30pm   Lungs:  clear O2 Sat: 95% on room air. IV 0.9% NS with Angio Cath:  22g  IV Site: R Antecubital  IV Started by:  Milana Na, EMT-P  Chest Size (in):  52 Cup Size: C  Height: 5\' 2"  (1.575 m)  Weight:  226 lb (102.513 kg)  BMI:  Body mass index is 41.33 kg/(m^2). Tech Comments:  CBG 200 mg/dl   No meds this am    Nuclear Med Study 1 or 2 day study: 2 day  Stress Test Type:  Eugenie Birks  Reading MD: Kristeen Miss, MD  Order Authorizing Provider:  B.Crenshaw MD  Resting Radionuclide: Technetium 73m Sestamibi  Resting Radionuclide Dose: 33.0 mCi  08/21/12  Stress Radionuclide:  Technetium 72m Sestamibi  Stress Radionuclide Dose: 33.0 mCi   08/22/12          Stress Protocol Rest HR: 80 Stress HR: 91  Rest BP: 158/69 Stress BP: 140/59  Exercise Time (min): n/a METS: n/a   Predicted Max HR: 142 bpm % Max HR: 64.08 bpm Rate Pressure Product: 53664   Dose of Adenosine (mg):  n/a Dose of Lexiscan: 0.4 mg  Dose of Atropine (mg): n/a Dose of Dobutamine: n/a mcg/kg/min (at max HR)  Stress Test Technologist: Milana Na, EMT-P  Nuclear Technologist:  Doyne Keel, CNMT     Rest Procedure:  Myocardial perfusion imaging was performed at rest 45 minutes following the intravenous administration of Technetium 24m Sestamibi. Rest ECG: NSR - Normal EKG  Stress Procedure:  The  patient received IV Lexiscan 0.4 mg over 15-seconds.  Technetium 7m Sestamibi injected at 30-seconds. This patient had sob, dizziness, and abdominal pain with the Lexiscan injection. Quantitative spect images were obtained after a 45 minute delay. Stress ECG: No significant change from baseline ECG  QPS Raw Data Images:  Normal; no motion artifact; normal heart/lung ratio. Stress Images:  Normal homogeneous uptake in all areas of the myocardium. Rest Images:  Normal homogeneous uptake in all areas of the myocardium. Subtraction (SDS):  No evidence of ischemia. Transient Ischemic Dilatation (Normal <1.22):  n/a Lung/Heart Ratio (Normal <0.45):  0.44  Quantitative Gated Spect Images QGS EDV:  81 ml QGS ESV:  32 ml  Impression Exercise Capacity:  Lexiscan with no exercise. BP Response:  Normal blood pressure response. Clinical Symptoms:  No significant symptoms noted. ECG Impression:  No significant ST segment change suggestive of ischemia. Comparison with Prior Nuclear Study: No significant change from previous study 06/27/10  Overall Impression:  Normal stress nuclear study.  LV Ejection Fraction: 61%.  LV Wall Motion:  NL LV Function; NL Wall Motion.  [name], [name], MD, [facility] 08/22/2012, 7:58 PM Office - [phone] Pager [phone]

## 2012-08-22 NOTE — Progress Notes (Signed)
Echocardiogram performed.  

## 2012-08-28 ENCOUNTER — Institutional Professional Consult (permissible substitution): Payer: Medicare HMO | Admitting: Cardiology

## 2012-09-02 ENCOUNTER — Telehealth: Payer: Self-pay | Admitting: Cardiology

## 2012-09-02 NOTE — Telephone Encounter (Signed)
Pt aware of stress test results. Result faxed to Dr. Evlyn Kanner per pt's request to [phone].

## 2012-09-02 NOTE — Telephone Encounter (Signed)
New Prob  Pt states that Dr Evlyn Kanner office informed her that they have not received the echo or stress test that was done her on 8.14.14. She wants to know the results.

## 2012-09-04 ENCOUNTER — Telehealth: Payer: Self-pay | Admitting: Cardiology

## 2012-09-04 NOTE — Telephone Encounter (Signed)
Spoke with pt husband, aware of echo and stress test results. Results forwarded to dr Evlyn Kanner at pt request

## 2012-09-04 NOTE — Telephone Encounter (Signed)
F/up  Pt is giving a call back // Does not know the nature of the call.

## 2013-07-25 ENCOUNTER — Encounter (HOSPITAL_COMMUNITY): Payer: Self-pay | Admitting: Pharmacy Technician

## 2013-07-30 NOTE — H&P (Signed)
TOTAL KNEE ADMISSION H&P  Patient is being admitted for right total knee arthroplasty.  Subjective:  Chief Complaint:   Right knee OA / pain.  HPI: Lauren Crosby, 80 y.o. female, has a history of pain and functional disability in the right knee due to arthritis and has failed non-surgical conservative treatments for greater than 12 weeks to includeNSAID's and/or analgesics, corticosteriod injections, viscosupplementation injections, use of assistive devices and activity modification.  Onset of symptoms was gradual, starting years ago with gradually worsening course since that time. The patient noted prior procedures on the knee to include  arthroscopy on the right knee(s).  Patient currently rates pain in the right knee(s) at 10 out of 10 with activity. Patient has night pain, worsening of pain with activity and weight bearing, pain that interferes with activities of daily living, pain with passive range of motion, crepitus and joint swelling.  Patient has evidence of periarticular osteophytes and joint space narrowing by imaging studies.  There is no active infection.  Risks, benefits and expectations were discussed with the patient.  Risks including but not limited to the risk of anesthesia, blood clots, nerve damage, blood vessel damage, failure of the prosthesis, infection and up to and including death.  Patient understand the risks, benefits and expectations and wishes to proceed with surgery.   PCP: Sheela Stack, MD  D/C Plans:      Home with HHPT  Post-op Meds:       No Rx given  Tranexamic Acid:      To be given - IV    Decadron:      Is to be given  FYI:     ASA post-op  Norco post-op  CPM (hospital and home)   Patient Active Problem List   Diagnosis Date Noted  . Preop cardiovascular exam 08/14/2012  . Chest pain 08/14/2012  . Suture granuloma 02/09/2012  . DYSPNEA ON EXERTION 10/04/2009  . DIABETES MELLITUS, TYPE II 04/15/2007  . HYPERLIPIDEMIA 04/15/2007  .  OBESITY 04/15/2007  . ANEMIA, CHRONIC 04/15/2007  . ANXIETY 04/15/2007  . DEPRESSION 04/15/2007  . HYPERTENSION 04/15/2007  . GERD 04/15/2007  . DEGENERATIVE JOINT DISEASE 04/15/2007  . HELICOBACTER PYLORI INFECTION, HX OF 04/15/2007  . COLONIC POLYPS 10/04/2006  . DIVERTICULOSIS, COLON 10/04/2006  . REFLUX ESOPHAGITIS 07/23/2001  . GASTRITIS, ACUTE 07/23/2001   Past Medical History  Diagnosis Date  . Colon polyp     adenomatous  . Hypertension   . Hyperlipemia   . Adrenal adenoma   . Gout   . Hernia   . Thalassemia minor   . Goiter   . Diabetes     type 2  . 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 Retinopathy due to secondary diabetes mellitus   . Nephrolithiasis   . Open wound of abdominal wall, anterior, without mention of complication   . Cellulitis and abscess of trunk   . Diverticulitis     Past Surgical History  Procedure Laterality Date  . Cholecystectomy    . Sigmoid resection / rectopexy    . Ventral hernia repair  2007  . Total abdominal hysterectomy    . Rotator cuff repair    . Cardiac catheterization  2008    conclusion: smooth and normal coronary arteries, normal left ventricular systolic function  . Colonoscopy  10/04/06  . Diagnostic mammogram  03/17/2011  . Appendectomy    . Tonsillectomy      No prescriptions prior to admission   Allergies  Allergen Reactions  . Penicillins Other (See Comments)    SHAKY AND DIZZY    History  Substance Use Topics  . Smoking status: Never Smoker   . Smokeless tobacco: Never Used  . Alcohol Use: No    Family History  Problem Relation Age of Onset  . Diabetes    . Hypertension    . Diabetes Father   . Hypertension Father      Review of Systems  Constitutional: Negative.   HENT: Negative.   Eyes: Negative.   Respiratory: Positive for shortness of breath.   Cardiovascular: Negative.   Gastrointestinal: Positive for heartburn and constipation.  Genitourinary: Negative.   Musculoskeletal:  Positive for joint pain.  Skin: Negative.   Neurological: Negative.   Endo/Heme/Allergies: Negative.   Psychiatric/Behavioral: Positive for depression. The patient is nervous/anxious.     Objective:  Physical Exam  Constitutional: She is oriented to person, place, and time. She appears well-developed and well-nourished.  HENT:  Head: Normocephalic and atraumatic.  Mouth/Throat: Oropharynx is clear and moist.  Eyes: Pupils are equal, round, and reactive to light.  Neck: Neck supple. No JVD present. No tracheal deviation present. No thyromegaly present.  Cardiovascular: Normal rate, regular rhythm, normal heart sounds and intact distal pulses.   Respiratory: Effort normal. No stridor. No respiratory distress. She has wheezes.  GI: Soft. There is no tenderness. There is no guarding.  Musculoskeletal:       Right knee: She exhibits decreased range of motion, swelling and bony tenderness. She exhibits no deformity, no laceration, no erythema and normal alignment. Tenderness found.  Lymphadenopathy:    She has no cervical adenopathy.  Neurological: She is alert and oriented to person, place, and time.  Skin: Skin is warm and dry.  Psychiatric: She has a normal mood and affect.     Labs:  Estimated body mass index is 41.33 kg/(m^2) as calculated from the following:   Height as of 08/22/12: 5\' 2"  (1.575 m).   Weight as of 08/22/12: 102.513 kg (226 lb).   Imaging Review Plain radiographs demonstrate severe degenerative joint disease of the right knee(s). The overall alignment is neutral. The bone quality appears to be good for age and reported activity level.  Assessment/Plan:  End stage arthritis, right knee   The patient history, physical examination, clinical judgment of the provider and imaging studies are consistent with end stage degenerative joint disease of the right knee(s) and total knee arthroplasty is deemed medically necessary. The treatment options including medical  management, injection therapy arthroscopy and arthroplasty were discussed at length. The risks and benefits of total knee arthroplasty were presented and reviewed. The risks due to aseptic loosening, infection, stiffness, patella tracking problems, thromboembolic complications and other imponderables were discussed. The patient acknowledged the explanation, agreed to proceed with the plan and consent was signed. Patient is being admitted for inpatient treatment for surgery, pain control, PT, OT, prophylactic antibiotics, VTE prophylaxis, progressive ambulation and ADL's and discharge planning. The patient is planning to be discharged home with home health services.    [place] Pugh Norbert Malkin   PA-C  07/30/2013, 4:51 PM

## 2013-07-31 ENCOUNTER — Ambulatory Visit (HOSPITAL_COMMUNITY)
Admission: RE | Admit: 2013-07-31 | Discharge: 2013-07-31 | Disposition: A | Discharge status: Home / Self Care | Payer: Medicare HMO | Source: Ambulatory Visit | Attending: Anesthesiology | Admitting: Anesthesiology

## 2013-07-31 ENCOUNTER — Encounter (HOSPITAL_COMMUNITY)
Admission: RE | Admit: 2013-07-31 | Discharge: 2013-07-31 | Disposition: A | Discharge status: Home / Self Care | Payer: Medicare HMO | Source: Ambulatory Visit | Attending: Orthopedic Surgery | Admitting: Orthopedic Surgery

## 2013-07-31 ENCOUNTER — Encounter (HOSPITAL_COMMUNITY): Payer: Self-pay

## 2013-07-31 DIAGNOSIS — M47814 Spondylosis without myelopathy or radiculopathy, thoracic region: Secondary | ICD-10-CM | POA: Insufficient documentation

## 2013-07-31 DIAGNOSIS — R0602 Shortness of breath: Secondary | ICD-10-CM | POA: Insufficient documentation

## 2013-07-31 DIAGNOSIS — I10 Essential (primary) hypertension: Secondary | ICD-10-CM | POA: Diagnosis not present

## 2013-07-31 DIAGNOSIS — E119 Type 2 diabetes mellitus without complications: Secondary | ICD-10-CM | POA: Diagnosis not present

## 2013-07-31 DIAGNOSIS — Z01818 Encounter for other preprocedural examination: Secondary | ICD-10-CM | POA: Diagnosis present

## 2013-07-31 DIAGNOSIS — I517 Cardiomegaly: Secondary | ICD-10-CM | POA: Diagnosis not present

## 2013-07-31 DIAGNOSIS — Z01812 Encounter for preprocedural laboratory examination: Secondary | ICD-10-CM | POA: Diagnosis not present

## 2013-07-31 HISTORY — DX: Unspecified osteoarthritis, unspecified site: M19.90

## 2013-07-31 HISTORY — DX: Gastro-esophageal reflux disease without esophagitis: K21.9

## 2013-07-31 HISTORY — DX: Inflammatory liver disease, unspecified: K75.9

## 2013-07-31 HISTORY — DX: Shortness of breath: R06.02

## 2013-07-31 HISTORY — DX: Pneumonia, unspecified organism: J18.9

## 2013-07-31 LAB — CBC
HCT: 31.1 % — ABNORMAL LOW (ref 36.0–46.0)
Hemoglobin: 10 g/dL — ABNORMAL LOW (ref 12.0–15.0)
MCH: 20.7 pg — ABNORMAL LOW (ref 26.0–34.0)
MCHC: 32.2 g/dL (ref 30.0–36.0)
MCV: 64.5 fL — ABNORMAL LOW (ref 78.0–100.0)
Platelets: 158 10*3/uL (ref 150–400)
RBC: 4.82 MIL/uL (ref 3.87–5.11)
RDW: 17.2 % — ABNORMAL HIGH (ref 11.5–15.5)
WBC: 6.5 10*3/uL (ref 4.0–10.5)

## 2013-07-31 LAB — PROTIME-INR
INR: 1.08 (ref 0.00–1.49)
PROTHROMBIN TIME: 14 s (ref 11.6–15.2)

## 2013-07-31 LAB — URINALYSIS, ROUTINE W REFLEX MICROSCOPIC
BILIRUBIN URINE: NEGATIVE
Glucose, UA: 100 mg/dL — AB
HGB URINE DIPSTICK: NEGATIVE
KETONES UR: NEGATIVE mg/dL
Nitrite: NEGATIVE
Protein, ur: 30 mg/dL — AB
Specific Gravity, Urine: 1.019 (ref 1.005–1.030)
UROBILINOGEN UA: 0.2 mg/dL (ref 0.0–1.0)
pH: 5 (ref 5.0–8.0)

## 2013-07-31 LAB — BASIC METABOLIC PANEL
Anion gap: 15 (ref 5–15)
BUN: 30 mg/dL — AB (ref 6–23)
CO2: 22 mEq/L (ref 19–32)
Calcium: 9.8 mg/dL (ref 8.4–10.5)
Chloride: 100 mEq/L (ref 96–112)
Creatinine, Ser: 0.75 mg/dL (ref 0.50–1.10)
GFR calc Af Amer: >90 mL/min (ref >90)
GFR calc non Af Amer: 78 mL/min — ABNORMAL LOW (ref >90)
Glucose, Bld: 264 mg/dL — ABNORMAL HIGH (ref 70–99)
POTASSIUM: 4.9 meq/L (ref 3.7–5.3)
Sodium: 137 mEq/L (ref 137–147)

## 2013-07-31 LAB — URINE MICROSCOPIC-ADD ON

## 2013-07-31 LAB — SURGICAL PCR SCREEN
MRSA, PCR: NEGATIVE
Staphylococcus aureus: NEGATIVE

## 2013-07-31 LAB — APTT: aPTT: 31 seconds (ref 24–37)

## 2013-07-31 NOTE — Progress Notes (Signed)
07/31/13 1000  OBSTRUCTIVE SLEEP APNEA  Have you ever been diagnosed with sleep apnea through a sleep study? No  Do you snore loudly (loud enough to be heard through closed doors)?  0  Do you often feel tired, fatigued, or sleepy during the daytime? 1  Has anyone observed you stop breathing during your sleep? 0  Do you have, or are you being treated for high blood pressure? 1  BMI more than 35 kg/m2? 1  Age over 78 years old? 1  Neck circumference greater than 40 cm/16 inches? 1  Gender: 0  Obstructive Sleep Apnea Score 5  Score 4 or greater  Results sent to PCP

## 2013-07-31 NOTE — Patient Instructions (Signed)
YOUR SURGERY IS SCHEDULED AT [facility]  ON:  Monday  8/3  REPORT TO  SHORT STAY CENTER AT:  12:30 PM   PLEASE COME IN THE [facility] MAIN HOSPITAL ENTRANCE AND FOLLOW SIGNS TO SHORT STAY CENTER.  DO NOT EAT ANYTHING AFTER MIDNIGHT THE NIGHT BEFORE YOUR SURGERY.  NO FOOD, NO CHEWING GUM, NO MINTS, NO CANDIES, NO CHEWING TOBACCO.  YOU MAY HAVE CLEAR LIQUIDS TO DRINK FROM MIDNIGHT THE NIGHT BEFORE SURGERY  -  UNTIL 8:45 AM DAY OF SURGERY  -  LIKE WATER, TEA, DIET COKE - COFFEE BUT NO MILK OR MILK PRODUCTS.  NOTHING TO DRINK AFTER 8:45 AM DAY OF SURGERY.  PLEASE TAKE THE FOLLOWING MEDICATIONS THE AM OF YOUR SURGERY WITH A FEW SIPS OF WATER:  NO MEDICINES TO TAKE DAY OF SURGERY.   IF YOU ARE DIABETIC:  DO NOT TAKE ANY DIABETIC MEDICATIONS THE AM OF YOUR SURGERY.  IF YOU TAKE INSULIN IN THE EVENINGS--PLEASE ONLY TAKE 1/2 NORMAL EVENING DOSE THE NIGHT BEFORE YOUR SURGERY.  NO INSULIN THE AM OF YOUR SURGERY.   DO NOT BRING VALUABLES, MONEY, CREDIT CARDS.  DO NOT WEAR JEWELRY, MAKE-UP, NAIL POLISH AND NO METAL PINS OR CLIPS IN YOUR HAIR. CONTACT LENS, DENTURES / PARTIALS, GLASSES SHOULD NOT BE WORN TO SURGERY AND IN MOST CASES-HEARING AIDS WILL NEED TO BE REMOVED.  BRING YOUR GLASSES CASE, ANY EQUIPMENT NEEDED FOR YOUR CONTACT LENS. FOR PATIENTS ADMITTED TO THE HOSPITAL--CHECK OUT TIME THE DAY OF DISCHARGE IS 11:00 AM.  ALL INPATIENT ROOMS ARE PRIVATE - WITH BATHROOM, TELEPHONE, TELEVISION AND WIFI INTERNET.                                                    PLEASE READ OVER ANY  FACT SHEETS THAT YOU WERE GIVEN: MRSA INFORMATION, BLOOD TRANSFUSION INFORMATION, INCENTIVE SPIROMETER INFORMATION.  PLEASE BE AWARE THAT YOU MAY NEED ADDITIONAL BLOOD DRAWN DAY OF YOUR SURGERY  _______________________________________________________________________   [facility] - Preparing for Surgery Before surgery, you can play an important role.  Because skin is not sterile, your skin needs to be as  free of germs as possible.  You can reduce the number of germs on your skin by washing with CHG (chlorahexidine gluconate) soap before surgery.  CHG is an antiseptic cleaner which kills germs and bonds with the skin to continue killing germs even after washing. Please DO NOT use if you have an allergy to CHG or antibacterial soaps.  If your skin becomes reddened/irritated stop using the CHG and inform your nurse when you arrive at Short Stay. Do not shave (including legs and underarms) for at least 48 hours prior to the first CHG shower.  You may shave your face/neck. Please follow these instructions carefully:  1.  Shower with CHG Soap the night before surgery and the  morning of Surgery.  2.  If you choose to wash your hair, wash your hair first as usual with your  normal  shampoo.  3.  After you shampoo, rinse your hair and body thoroughly to remove the  shampoo.                           4.  Use CHG as you would any other liquid soap.  You can apply chg directly  to the skin and wash                       Gently with a scrungie or clean washcloth.  5.  Apply the CHG Soap to your body ONLY FROM THE NECK DOWN.   Do not use on face/ open                           Wound or open sores. Avoid contact with eyes, ears mouth and genitals (private parts).                       Wash face,  Genitals (private parts) with your normal soap.             6.  Wash thoroughly, paying special attention to the area where your surgery  will be performed.  7.  Thoroughly rinse your body with warm water from the neck down.  8.  DO NOT shower/wash with your normal soap after using and rinsing off  the CHG Soap.                9.  Pat yourself dry with a clean towel.            10.  Wear clean pajamas.            11.  Place clean sheets on your bed the night of your first shower and do not  sleep with pets. Day of Surgery : Do not apply any lotions/deodorants the morning of surgery.  Please wear clean clothes to the  hospital/surgery center.  FAILURE TO FOLLOW THESE INSTRUCTIONS MAY RESULT IN THE CANCELLATION OF YOUR SURGERY PATIENT SIGNATURE_________________________________  NURSE SIGNATURE__________________________________  ________________________________________________________________________   Adam Phenix  An incentive spirometer is a tool that can help keep your lungs clear and active. This tool measures how well you are filling your lungs with each breath. Taking long deep breaths may help reverse or decrease the chance of developing breathing (pulmonary) problems (especially infection) following:  A long period of time when you are unable to move or be active. BEFORE THE PROCEDURE   If the spirometer includes an indicator to show your best effort, your nurse or respiratory therapist will set it to a desired goal.  If possible, sit up straight or lean slightly forward. Try not to slouch.  Hold the incentive spirometer in an upright position. INSTRUCTIONS FOR USE  1. Sit on the edge of your bed if possible, or sit up as far as you can in bed or on a chair. 2. Hold the incentive spirometer in an upright position. 3. Breathe out normally. 4. Place the mouthpiece in your mouth and seal your lips tightly around it. 5. Breathe in slowly and as deeply as possible, raising the piston or the ball toward the top of the column. 6. Hold your breath for 3-5 seconds or for as long as possible. Allow the piston or ball to fall to the bottom of the column. 7. Remove the mouthpiece from your mouth and breathe out normally. 8. Rest for a few seconds and repeat Steps 1 through 7 at least 10 times every 1-2 hours when you are awake. Take your time and take a few normal breaths between deep breaths. 9. The spirometer may include an indicator to show your best effort. Use the indicator as a goal to work toward during each repetition. 10. After each  set of 10 deep breaths, practice coughing to be sure  your lungs are clear. If you have an incision (the cut made at the time of surgery), support your incision when coughing by placing a pillow or rolled up towels firmly against it. Once you are able to get out of bed, walk around indoors and cough well. You may stop using the incentive spirometer when instructed by your caregiver.  RISKS AND COMPLICATIONS  Take your time so you do not get dizzy or light-headed.  If you are in pain, you may need to take or ask for pain medication before doing incentive spirometry. It is harder to take a deep breath if you are having pain. AFTER USE  Rest and breathe slowly and easily.  It can be helpful to keep track of a log of your progress. Your caregiver can provide you with a simple table to help with this. If you are using the spirometer at home, follow these instructions: [place] IF:   You are having difficultly using the spirometer.  You have trouble using the spirometer as often as instructed.  Your pain medication is not giving enough relief while using the spirometer.  You develop fever of 100.5 F (38.1 C) or higher. SEEK IMMEDIATE MEDICAL CARE IF:   You cough up bloody sputum that had not been present before.  You develop fever of 102 F (38.9 C) or greater.  You develop worsening pain at or near the incision site. MAKE SURE YOU:   Understand these instructions.  Will watch your condition.  Will get help right away if you are not doing well or get worse. Document Released: 05/08/2006 Document Revised: 03/20/2011 Document Reviewed: 07/09/2006 [employer] Patient Information 2014 [employer], [place].   ________________________________________________________________________  WHAT IS A BLOOD TRANSFUSION? Blood Transfusion Information  A transfusion is the replacement of blood or some of its parts. Blood is made up of multiple cells which provide different functions.  Red blood cells carry oxygen and are used for blood loss  replacement.  White blood cells fight against infection.  Platelets control bleeding.  Plasma helps clot blood.  Other blood products are available for specialized needs, such as hemophilia or other clotting disorders. BEFORE THE TRANSFUSION  Who gives blood for transfusions?   Healthy volunteers who are fully evaluated to make sure their blood is safe. This is blood bank blood. Transfusion therapy is the safest it has ever been in the practice of medicine. Before blood is taken from a donor, a complete history is taken to make sure that person has no history of diseases nor engages in risky social behavior (examples are intravenous drug use or sexual activity with multiple partners). The donor's travel history is screened to minimize risk of transmitting infections, such as malaria. The donated blood is tested for signs of infectious diseases, such as HIV and hepatitis. The blood is then tested to be sure it is compatible with you in order to minimize the chance of a transfusion reaction. If you or a relative donates blood, this is often done in anticipation of surgery and is not appropriate for emergency situations. It takes many days to process the donated blood. RISKS AND COMPLICATIONS Although transfusion therapy is very safe and saves many lives, the main dangers of transfusion include:   Getting an infectious disease.  Developing a transfusion reaction. This is an allergic reaction to something in the blood you were given. Every precaution is taken to prevent this. The decision to have  a blood transfusion has been considered carefully by your caregiver before blood is given. Blood is not given unless the benefits outweigh the risks. AFTER THE TRANSFUSION  Right after receiving a blood transfusion, you will usually feel much better and more energetic. This is especially true if your red blood cells have gotten low (anemic). The transfusion raises the level of the red blood cells which  carry oxygen, and this usually causes an energy increase.  The nurse administering the transfusion will monitor you carefully for complications. HOME CARE INSTRUCTIONS  No special instructions are needed after a transfusion. You may find your energy is better. Speak with your caregiver about any limitations on activity for underlying diseases you may have. SEEK MEDICAL CARE IF:   Your condition is not improving after your transfusion.  You develop redness or irritation at the intravenous (IV) site. SEEK IMMEDIATE MEDICAL CARE IF:  Any of the following symptoms occur over the next 12 hours:  Shaking chills.  You have a temperature by mouth above 102 F (38.9 C), not controlled by medicine.  Chest, back, or muscle pain.  People around you feel you are not acting correctly or are confused.  Shortness of breath or difficulty breathing.  Dizziness and fainting.  You get a rash or develop hives.  You have a decrease in urine output.  Your urine turns a dark color or changes to pink, red, or brown. Any of the following symptoms occur over the next 10 days:  You have a temperature by mouth above 102 F (38.9 C), not controlled by medicine.  Shortness of breath.  Weakness after normal activity.  The white part of the eye turns yellow (jaundice).  You have a decrease in the amount of urine or are urinating less often.  Your urine turns a dark color or changes to pink, red, or brown. Document Released: 12/24/1999 Document Revised: 03/20/2011 Document Reviewed: 08/12/2007 [facility] Patient Information 2014 [place], [place].  _______________________________________________________________________

## 2013-07-31 NOTE — Pre-Procedure Instructions (Signed)
PT HAS MEDICAL CLEARANCE FROM DR. SOUTH WITH OFFICE NOTE AND EKG REPORT 07-21-13. CXR REPORT FROM DR. SOUTH 04/2013 ABNORMAL - CXR WAS REPEATED TODAY PREOP AT [facility]. PT'S PREOP BMET REPORT ROUTED THRU EPIC TO DR. Aurea Graff OFFICE - PT'S BUN 30 AND GLUCOSE 264.  PT IS DIABETIC - CBG WILL BE CHECKED AM OF SURGERY.

## 2013-08-11 ENCOUNTER — Inpatient Hospital Stay (HOSPITAL_COMMUNITY)
Admission: RE | Admit: 2013-08-11 | Discharge: 2013-08-13 | DRG: 470 | Disposition: A | Discharge status: Skilled Nursing Facility | Payer: Medicare HMO | Source: Ambulatory Visit | Attending: Orthopedic Surgery | Admitting: Orthopedic Surgery

## 2013-08-11 ENCOUNTER — Encounter (HOSPITAL_COMMUNITY): Payer: Self-pay | Admitting: *Deleted

## 2013-08-11 ENCOUNTER — Encounter (HOSPITAL_COMMUNITY)
Admission: RE | Disposition: A | Discharge status: Skilled Nursing Facility | Payer: Self-pay | Source: Ambulatory Visit | Attending: Orthopedic Surgery

## 2013-08-11 ENCOUNTER — Encounter (HOSPITAL_COMMUNITY): Payer: Medicare HMO | Admitting: Anesthesiology

## 2013-08-11 ENCOUNTER — Inpatient Hospital Stay (HOSPITAL_COMMUNITY): Payer: Medicare HMO | Admitting: Anesthesiology

## 2013-08-11 DIAGNOSIS — M659 Unspecified synovitis and tenosynovitis, unspecified site: Secondary | ICD-10-CM | POA: Diagnosis present

## 2013-08-11 DIAGNOSIS — Z87442 Personal history of urinary calculi: Secondary | ICD-10-CM | POA: Diagnosis not present

## 2013-08-11 DIAGNOSIS — M25469 Effusion, unspecified knee: Secondary | ICD-10-CM | POA: Diagnosis present

## 2013-08-11 DIAGNOSIS — E785 Hyperlipidemia, unspecified: Secondary | ICD-10-CM | POA: Diagnosis present

## 2013-08-11 DIAGNOSIS — N058 Unspecified nephritic syndrome with other morphologic changes: Secondary | ICD-10-CM | POA: Diagnosis present

## 2013-08-11 DIAGNOSIS — M898X9 Other specified disorders of bone, unspecified site: Secondary | ICD-10-CM | POA: Diagnosis present

## 2013-08-11 DIAGNOSIS — M109 Gout, unspecified: Secondary | ICD-10-CM | POA: Diagnosis present

## 2013-08-11 DIAGNOSIS — Z8249 Family history of ischemic heart disease and other diseases of the circulatory system: Secondary | ICD-10-CM | POA: Diagnosis not present

## 2013-08-11 DIAGNOSIS — D62 Acute posthemorrhagic anemia: Secondary | ICD-10-CM | POA: Diagnosis not present

## 2013-08-11 DIAGNOSIS — Z860100 Personal history of colon polyps, unspecified: Secondary | ICD-10-CM

## 2013-08-11 DIAGNOSIS — E1139 Type 2 diabetes mellitus with other diabetic ophthalmic complication: Secondary | ICD-10-CM | POA: Diagnosis present

## 2013-08-11 DIAGNOSIS — F329 Major depressive disorder, single episode, unspecified: Secondary | ICD-10-CM | POA: Diagnosis present

## 2013-08-11 DIAGNOSIS — E11319 Type 2 diabetes mellitus with unspecified diabetic retinopathy without macular edema: Secondary | ICD-10-CM | POA: Diagnosis present

## 2013-08-11 DIAGNOSIS — E1129 Type 2 diabetes mellitus with other diabetic kidney complication: Secondary | ICD-10-CM | POA: Diagnosis present

## 2013-08-11 DIAGNOSIS — M25569 Pain in unspecified knee: Secondary | ICD-10-CM | POA: Diagnosis present

## 2013-08-11 DIAGNOSIS — Z833 Family history of diabetes mellitus: Secondary | ICD-10-CM

## 2013-08-11 DIAGNOSIS — M1710 Unilateral primary osteoarthritis, unspecified knee: Secondary | ICD-10-CM | POA: Diagnosis present

## 2013-08-11 DIAGNOSIS — Z6841 Body Mass Index (BMI) 40.0 and over, adult: Secondary | ICD-10-CM

## 2013-08-11 DIAGNOSIS — Z96659 Presence of unspecified artificial knee joint: Secondary | ICD-10-CM

## 2013-08-11 DIAGNOSIS — Z8701 Personal history of pneumonia (recurrent): Secondary | ICD-10-CM | POA: Diagnosis not present

## 2013-08-11 DIAGNOSIS — Z794 Long term (current) use of insulin: Secondary | ICD-10-CM | POA: Diagnosis not present

## 2013-08-11 DIAGNOSIS — F411 Generalized anxiety disorder: Secondary | ICD-10-CM | POA: Diagnosis present

## 2013-08-11 DIAGNOSIS — F3289 Other specified depressive episodes: Secondary | ICD-10-CM | POA: Diagnosis present

## 2013-08-11 DIAGNOSIS — K219 Gastro-esophageal reflux disease without esophagitis: Secondary | ICD-10-CM | POA: Diagnosis present

## 2013-08-11 DIAGNOSIS — I10 Essential (primary) hypertension: Secondary | ICD-10-CM | POA: Diagnosis present

## 2013-08-11 DIAGNOSIS — Z96651 Presence of right artificial knee joint: Secondary | ICD-10-CM

## 2013-08-11 HISTORY — PX: TOTAL KNEE ARTHROPLASTY: SHX125

## 2013-08-11 LAB — GLUCOSE, CAPILLARY
GLUCOSE-CAPILLARY: 165 mg/dL — AB (ref 70–99)
Glucose-Capillary: 175 mg/dL — ABNORMAL HIGH (ref 70–99)
Glucose-Capillary: 320 mg/dL — ABNORMAL HIGH (ref 70–99)

## 2013-08-11 LAB — TYPE AND SCREEN
ABO/RH(D): A POS
ANTIBODY SCREEN: NEGATIVE

## 2013-08-11 SURGERY — ARTHROPLASTY, KNEE, TOTAL
Anesthesia: Spinal | Site: Knee | Laterality: Right

## 2013-08-11 MED ORDER — PHENYLEPHRINE HCL 10 MG/ML IJ SOLN
10.0000 mg | INTRAMUSCULAR | Status: DC | PRN
  Administered 2013-08-11: 40 ug/min via INTRAVENOUS

## 2013-08-11 MED ORDER — DEXAMETHASONE SODIUM PHOSPHATE 10 MG/ML IJ SOLN
10.0000 mg | Freq: Once | INTRAMUSCULAR | Status: AC
  Administered 2013-08-12: 10 mg via INTRAVENOUS
  Filled 2013-08-11: qty 1

## 2013-08-11 MED ORDER — HYDROMORPHONE HCL PF 1 MG/ML IJ SOLN
0.5000 mg | INTRAMUSCULAR | Status: DC | PRN
  Administered 2013-08-11 – 2013-08-12 (×3): 0.5 mg via INTRAVENOUS
  Filled 2013-08-11 (×3): qty 1

## 2013-08-11 MED ORDER — DEXAMETHASONE SODIUM PHOSPHATE 10 MG/ML IJ SOLN
INTRAMUSCULAR | Status: DC | PRN
  Administered 2013-08-11: 10 mg via INTRAVENOUS

## 2013-08-11 MED ORDER — INSULIN ASPART PROT & ASPART (70-30 MIX) 100 UNIT/ML ~~LOC~~ SUSP: 65.0000 [IU] | Freq: Every day | SUBCUTANEOUS | Status: DC

## 2013-08-11 MED ORDER — ONDANSETRON HCL 4 MG/2ML IJ SOLN
4.0000 mg | Freq: Four times a day (QID) | INTRAMUSCULAR | Status: DC | PRN
Start: 2013-08-11 — End: 2013-08-13

## 2013-08-11 MED ORDER — BUPIVACAINE IN DEXTROSE 0.75-8.25 % IT SOLN
INTRATHECAL | Status: DC | PRN
  Administered 2013-08-11: 1.8 mL via INTRATHECAL

## 2013-08-11 MED ORDER — SODIUM CHLORIDE 0.9 % IJ SOLN
INTRAMUSCULAR | Status: AC
  Filled 2013-08-11: qty 10

## 2013-08-11 MED ORDER — MENTHOL 3 MG MT LOZG
1.0000 | LOZENGE | OROMUCOSAL | Status: DC | PRN
  Filled 2013-08-11: qty 9

## 2013-08-11 MED ORDER — DOCUSATE SODIUM 100 MG PO CAPS
100.0000 mg | ORAL_CAPSULE | Freq: Two times a day (BID) | ORAL | Status: DC
  Administered 2013-08-11 – 2013-08-13 (×4): 100 mg via ORAL

## 2013-08-11 MED ORDER — INSULIN ASPART PROT & ASPART (70-30 MIX) 100 UNIT/ML ~~LOC~~ SUSP: 50.0000 [IU] | Freq: Two times a day (BID) | SUBCUTANEOUS | Status: DC

## 2013-08-11 MED ORDER — METOCLOPRAMIDE HCL 5 MG/ML IJ SOLN: 5.0000 mg | Freq: Three times a day (TID) | INTRAMUSCULAR | Status: DC | PRN

## 2013-08-11 MED ORDER — LOSARTAN POTASSIUM 50 MG PO TABS
100.0000 mg | ORAL_TABLET | Freq: Every evening | ORAL | Status: DC
  Administered 2013-08-11: 100 mg via ORAL
  Filled 2013-08-11 (×2): qty 2

## 2013-08-11 MED ORDER — FERROUS SULFATE 325 (65 FE) MG PO TABS
325.0000 mg | ORAL_TABLET | Freq: Three times a day (TID) | ORAL | Status: DC
  Administered 2013-08-11 – 2013-08-13 (×6): 325 mg via ORAL
  Filled 2013-08-11 (×8): qty 1

## 2013-08-11 MED ORDER — PROMETHAZINE HCL 25 MG/ML IJ SOLN: 6.2500 mg | INTRAMUSCULAR | Status: DC | PRN

## 2013-08-11 MED ORDER — BISACODYL 10 MG RE SUPP
10.0000 mg | Freq: Every day | RECTAL | Status: DC | PRN
  Administered 2013-08-13: 10 mg via RECTAL
  Filled 2013-08-11: qty 1

## 2013-08-11 MED ORDER — MAGNESIUM CITRATE PO SOLN: 1.0000 | Freq: Once | ORAL | Status: AC | PRN

## 2013-08-11 MED ORDER — HYDROMORPHONE HCL PF 1 MG/ML IJ SOLN
0.2500 mg | INTRAMUSCULAR | Status: DC | PRN
Start: 2013-08-11 — End: 2013-08-11

## 2013-08-11 MED ORDER — ONDANSETRON HCL 4 MG/2ML IJ SOLN
INTRAMUSCULAR | Status: AC
  Filled 2013-08-11: qty 2

## 2013-08-11 MED ORDER — STERILE WATER FOR IRRIGATION IR SOLN
Status: DC | PRN
  Administered 2013-08-11: 1500 mL

## 2013-08-11 MED ORDER — PHENOL 1.4 % MT LIQD
1.0000 | OROMUCOSAL | Status: DC | PRN
  Filled 2013-08-11: qty 177

## 2013-08-11 MED ORDER — DEXAMETHASONE SODIUM PHOSPHATE 10 MG/ML IJ SOLN
INTRAMUSCULAR | Status: AC
  Filled 2013-08-11: qty 1

## 2013-08-11 MED ORDER — SODIUM CHLORIDE 0.9 % IR SOLN
Status: DC | PRN
  Administered 2013-08-11: 1000 mL

## 2013-08-11 MED ORDER — CELECOXIB 200 MG PO CAPS
200.0000 mg | ORAL_CAPSULE | Freq: Two times a day (BID) | ORAL | Status: DC
  Administered 2013-08-11 – 2013-08-13 (×4): 200 mg via ORAL
  Filled 2013-08-11 (×5): qty 1

## 2013-08-11 MED ORDER — INSULIN ASPART PROT & ASPART (70-30 MIX) 100 UNIT/ML ~~LOC~~ SUSP
50.0000 [IU] | Freq: Every day | SUBCUTANEOUS | Status: DC
  Administered 2013-08-12: 50 [IU] via SUBCUTANEOUS
  Filled 2013-08-11: qty 10

## 2013-08-11 MED ORDER — PROPOFOL 10 MG/ML IV BOLUS
INTRAVENOUS | Status: AC
  Filled 2013-08-11: qty 20

## 2013-08-11 MED ORDER — KETOROLAC TROMETHAMINE 30 MG/ML IJ SOLN
INTRAMUSCULAR | Status: AC
  Filled 2013-08-11: qty 1

## 2013-08-11 MED ORDER — POLYETHYLENE GLYCOL 3350 17 G PO PACK
17.0000 g | PACK | Freq: Two times a day (BID) | ORAL | Status: DC
  Administered 2013-08-11 – 2013-08-13 (×3): 17 g via ORAL

## 2013-08-11 MED ORDER — SODIUM CHLORIDE 0.9 % IJ SOLN
INTRAMUSCULAR | Status: DC | PRN
  Administered 2013-08-11: 9 mL

## 2013-08-11 MED ORDER — HYDROCODONE-ACETAMINOPHEN 7.5-325 MG PO TABS
1.0000 | ORAL_TABLET | ORAL | Status: DC
  Administered 2013-08-11: 2 via ORAL
  Administered 2013-08-11: 1 via ORAL
  Administered 2013-08-12 – 2013-08-13 (×6): 2 via ORAL
  Filled 2013-08-11 (×3): qty 2
  Filled 2013-08-11: qty 1
  Filled 2013-08-11 (×4): qty 2

## 2013-08-11 MED ORDER — METFORMIN HCL 500 MG PO TABS
1000.0000 mg | ORAL_TABLET | Freq: Two times a day (BID) | ORAL | Status: DC
Start: 2013-08-12 — End: 2013-08-13
  Administered 2013-08-12 – 2013-08-13 (×3): 1000 mg via ORAL
  Filled 2013-08-11 (×5): qty 2

## 2013-08-11 MED ORDER — BUPIVACAINE LIPOSOME 1.3 % IJ SUSP
INTRAMUSCULAR | Status: DC | PRN
  Administered 2013-08-11: 20 mL

## 2013-08-11 MED ORDER — OXYCODONE HCL 5 MG PO TABS: 5.0000 mg | ORAL_TABLET | Freq: Once | ORAL | Status: DC | PRN

## 2013-08-11 MED ORDER — DEXAMETHASONE SODIUM PHOSPHATE 10 MG/ML IJ SOLN: 10.0000 mg | Freq: Once | INTRAMUSCULAR | Status: DC

## 2013-08-11 MED ORDER — INSULIN ASPART 100 UNIT/ML ~~LOC~~ SOLN
0.0000 [IU] | Freq: Three times a day (TID) | SUBCUTANEOUS | Status: DC
Start: 2013-08-12 — End: 2013-08-11
  Administered 2013-08-11: 3 [IU] via SUBCUTANEOUS

## 2013-08-11 MED ORDER — TRANEXAMIC ACID 100 MG/ML IV SOLN
1000.0000 mg | Freq: Once | INTRAVENOUS | Status: AC
  Administered 2013-08-11: 1000 mg via INTRAVENOUS
  Filled 2013-08-11: qty 10

## 2013-08-11 MED ORDER — OXYCODONE HCL 5 MG/5ML PO SOLN
5.0000 mg | Freq: Once | ORAL | Status: DC | PRN
  Filled 2013-08-11: qty 5

## 2013-08-11 MED ORDER — METHOCARBAMOL 500 MG PO TABS
500.0000 mg | ORAL_TABLET | Freq: Four times a day (QID) | ORAL | Status: DC | PRN
  Administered 2013-08-12: 500 mg via ORAL
  Filled 2013-08-11: qty 1

## 2013-08-11 MED ORDER — MEPERIDINE HCL 50 MG/ML IJ SOLN: 6.2500 mg | INTRAMUSCULAR | Status: DC | PRN

## 2013-08-11 MED ORDER — MIDAZOLAM HCL 2 MG/2ML IJ SOLN
INTRAMUSCULAR | Status: AC
  Filled 2013-08-11: qty 2

## 2013-08-11 MED ORDER — ASPIRIN EC 325 MG PO TBEC
325.0000 mg | DELAYED_RELEASE_TABLET | Freq: Two times a day (BID) | ORAL | Status: DC
  Administered 2013-08-12 – 2013-08-13 (×3): 325 mg via ORAL
  Filled 2013-08-11 (×5): qty 1

## 2013-08-11 MED ORDER — BUPIVACAINE-EPINEPHRINE (PF) 0.25% -1:200000 IJ SOLN
INTRAMUSCULAR | Status: DC | PRN
  Administered 2013-08-11: 30 mL

## 2013-08-11 MED ORDER — LIDOCAINE HCL (CARDIAC) 20 MG/ML IV SOLN
INTRAVENOUS | Status: AC
  Filled 2013-08-11: qty 5

## 2013-08-11 MED ORDER — INSULIN ASPART 100 UNIT/ML ~~LOC~~ SOLN
0.0000 [IU] | Freq: Every day | SUBCUTANEOUS | Status: DC
  Administered 2013-08-11: 4 [IU] via SUBCUTANEOUS

## 2013-08-11 MED ORDER — SODIUM CHLORIDE 0.9 % IV SOLN
INTRAVENOUS | Status: DC
  Administered 2013-08-11: 21:00:00 via INTRAVENOUS
  Filled 2013-08-11 (×4): qty 1000

## 2013-08-11 MED ORDER — CHLORHEXIDINE GLUCONATE 4 % EX LIQD: 60.0000 mL | Freq: Once | CUTANEOUS | Status: DC

## 2013-08-11 MED ORDER — EPHEDRINE SULFATE 50 MG/ML IJ SOLN
INTRAMUSCULAR | Status: AC
  Filled 2013-08-11: qty 1

## 2013-08-11 MED ORDER — BUPIVACAINE-EPINEPHRINE (PF) 0.25% -1:200000 IJ SOLN
INTRAMUSCULAR | Status: AC
  Filled 2013-08-11: qty 30

## 2013-08-11 MED ORDER — INSULIN ASPART 100 UNIT/ML ~~LOC~~ SOLN
0.0000 [IU] | Freq: Three times a day (TID) | SUBCUTANEOUS | Status: DC
  Administered 2013-08-12 (×3): 5 [IU] via SUBCUTANEOUS
  Administered 2013-08-13: 3 [IU] via SUBCUTANEOUS
  Administered 2013-08-13: 5 [IU] via SUBCUTANEOUS

## 2013-08-11 MED ORDER — METOCLOPRAMIDE HCL 10 MG PO TABS: 5.0000 mg | ORAL_TABLET | Freq: Three times a day (TID) | ORAL | Status: DC | PRN

## 2013-08-11 MED ORDER — PROPOFOL INFUSION 10 MG/ML OPTIME
INTRAVENOUS | Status: DC | PRN
  Administered 2013-08-11: 75 ug/kg/min via INTRAVENOUS

## 2013-08-11 MED ORDER — PROPOFOL 10 MG/ML IV BOLUS
INTRAVENOUS | Status: DC | PRN
  Administered 2013-08-11 (×2): 20 mg via INTRAVENOUS
  Administered 2013-08-11: 40 mg via INTRAVENOUS
  Administered 2013-08-11: 20 mg via INTRAVENOUS

## 2013-08-11 MED ORDER — KETOROLAC TROMETHAMINE 30 MG/ML IJ SOLN
INTRAMUSCULAR | Status: DC | PRN
  Administered 2013-08-11: 30 mg

## 2013-08-11 MED ORDER — ATORVASTATIN CALCIUM 40 MG PO TABS
40.0000 mg | ORAL_TABLET | Freq: Every day | ORAL | Status: DC
  Administered 2013-08-12: 40 mg via ORAL
  Filled 2013-08-11 (×2): qty 1

## 2013-08-11 MED ORDER — BUPIVACAINE LIPOSOME 1.3 % IJ SUSP
20.0000 mL | Freq: Once | INTRAMUSCULAR | Status: DC
  Filled 2013-08-11: qty 20

## 2013-08-11 MED ORDER — LIDOCAINE HCL (CARDIAC) 20 MG/ML IV SOLN
INTRAVENOUS | Status: DC | PRN
Start: 2013-08-11 — End: 2013-08-11
  Administered 2013-08-11: 50 mg via INTRAVENOUS

## 2013-08-11 MED ORDER — METHOCARBAMOL 1000 MG/10ML IJ SOLN
500.0000 mg | Freq: Four times a day (QID) | INTRAVENOUS | Status: DC | PRN
  Administered 2013-08-11: 500 mg via INTRAVENOUS
  Filled 2013-08-11: qty 5

## 2013-08-11 MED ORDER — CLINDAMYCIN PHOSPHATE 600 MG/50ML IV SOLN
600.0000 mg | Freq: Four times a day (QID) | INTRAVENOUS | Status: AC
  Administered 2013-08-11 – 2013-08-12 (×2): 600 mg via INTRAVENOUS
  Filled 2013-08-11 (×2): qty 50

## 2013-08-11 MED ORDER — CLINDAMYCIN PHOSPHATE 900 MG/50ML IV SOLN
900.0000 mg | INTRAVENOUS | Status: AC
  Administered 2013-08-11: 900 mg via INTRAVENOUS

## 2013-08-11 MED ORDER — CLINDAMYCIN PHOSPHATE 900 MG/50ML IV SOLN
INTRAVENOUS | Status: AC
  Filled 2013-08-11: qty 50

## 2013-08-11 MED ORDER — INSULIN ASPART 100 UNIT/ML ~~LOC~~ SOLN
SUBCUTANEOUS | Status: AC
  Filled 2013-08-11: qty 1

## 2013-08-11 MED ORDER — ONDANSETRON HCL 4 MG PO TABS: 4.0000 mg | ORAL_TABLET | Freq: Four times a day (QID) | ORAL | Status: DC | PRN

## 2013-08-11 MED ORDER — 0.9 % SODIUM CHLORIDE (POUR BTL) OPTIME
TOPICAL | Status: DC | PRN
  Administered 2013-08-11: 1000 mL

## 2013-08-11 MED ORDER — DIPHENHYDRAMINE HCL 25 MG PO CAPS: 25.0000 mg | ORAL_CAPSULE | Freq: Four times a day (QID) | ORAL | Status: DC | PRN

## 2013-08-11 MED ORDER — ONDANSETRON HCL 4 MG/2ML IJ SOLN
INTRAMUSCULAR | Status: DC | PRN
  Administered 2013-08-11: 4 mg via INTRAVENOUS

## 2013-08-11 MED ORDER — LACTATED RINGERS IV SOLN
INTRAVENOUS | Status: DC
  Administered 2013-08-11 (×3): via INTRAVENOUS

## 2013-08-11 MED ORDER — ALUM & MAG HYDROXIDE-SIMETH 200-200-20 MG/5ML PO SUSP: 30.0000 mL | ORAL | Status: DC | PRN

## 2013-08-11 SURGICAL SUPPLY — 57 items
ADH SKN CLS APL DERMABOND .7 (GAUZE/BANDAGES/DRESSINGS) ×1
BAG SPEC THK2 15X12 ZIP CLS (MISCELLANEOUS)
BAG ZIPLOCK 12X15 (MISCELLANEOUS) IMPLANT
BANDAGE ELASTIC 6 VELCRO ST LF (GAUZE/BANDAGES/DRESSINGS) ×3 IMPLANT
BANDAGE ESMARK 6X9 LF (GAUZE/BANDAGES/DRESSINGS) ×1 IMPLANT
BLADE SAW SGTL 13.0X1.19X90.0M (BLADE) ×3 IMPLANT
BNDG CMPR 9X6 STRL LF SNTH (GAUZE/BANDAGES/DRESSINGS) ×1
BNDG ESMARK 6X9 LF (GAUZE/BANDAGES/DRESSINGS) ×3
BONE CEMENT GENTAMICIN (Cement) ×6 IMPLANT
BOWL SMART MIX CTS (DISPOSABLE) ×3 IMPLANT
CAPT RP KNEE ×2 IMPLANT
CEMENT BONE GENTAMICIN 40 (Cement) IMPLANT
CUFF TOURN SGL QUICK 34 (TOURNIQUET CUFF) ×3
CUFF TRNQT CYL 34X4X40X1 (TOURNIQUET CUFF) ×1 IMPLANT
DERMABOND ADVANCED (GAUZE/BANDAGES/DRESSINGS) ×2
DERMABOND ADVANCED .7 DNX12 (GAUZE/BANDAGES/DRESSINGS) ×1 IMPLANT
DRAPE EXTREMITY T 121X128X90 (DRAPE) ×3 IMPLANT
DRAPE POUCH INSTRU U-SHP 10X18 (DRAPES) ×3 IMPLANT
DRAPE U-SHAPE 47X51 STRL (DRAPES) ×3 IMPLANT
DRSG AQUACEL AG ADV 3.5X10 (GAUZE/BANDAGES/DRESSINGS) ×3 IMPLANT
DRSG TEGADERM 4X4.75 (GAUZE/BANDAGES/DRESSINGS) IMPLANT
DURAPREP 26ML APPLICATOR (WOUND CARE) ×6 IMPLANT
ELECT REM PT RETURN 9FT ADLT (ELECTROSURGICAL) ×3
ELECTRODE REM PT RTRN 9FT ADLT (ELECTROSURGICAL) ×1 IMPLANT
FACESHIELD WRAPAROUND (MASK) ×15 IMPLANT
FACESHIELD WRAPAROUND OR TEAM (MASK) ×5 IMPLANT
GAUZE SPONGE 2X2 8PLY STRL LF (GAUZE/BANDAGES/DRESSINGS) IMPLANT
GLOVE BIOGEL PI IND STRL 7.5 (GLOVE) ×1 IMPLANT
GLOVE BIOGEL PI IND STRL 8 (GLOVE) ×1 IMPLANT
GLOVE BIOGEL PI INDICATOR 7.5 (GLOVE) ×2
GLOVE BIOGEL PI INDICATOR 8 (GLOVE) ×2
GLOVE ECLIPSE 8.0 STRL XLNG CF (GLOVE) ×3 IMPLANT
GLOVE ORTHO TXT STRL SZ7.5 (GLOVE) ×6 IMPLANT
GOWN SPEC L3 XXLG W/TWL (GOWN DISPOSABLE) ×3 IMPLANT
GOWN STRL REUS W/TWL LRG LVL3 (GOWN DISPOSABLE) ×3 IMPLANT
HANDPIECE INTERPULSE COAX TIP (DISPOSABLE) ×3
KIT BASIN OR (CUSTOM PROCEDURE TRAY) ×3 IMPLANT
MANIFOLD NEPTUNE II (INSTRUMENTS) ×3 IMPLANT
NDL SAFETY ECLIPSE 18X1.5 (NEEDLE) ×1 IMPLANT
NEEDLE HYPO 18GX1.5 SHARP (NEEDLE) ×3
PACK TOTAL JOINT (CUSTOM PROCEDURE TRAY) ×3 IMPLANT
POSITIONER SURGICAL ARM (MISCELLANEOUS) ×3 IMPLANT
SET HNDPC FAN SPRY TIP SCT (DISPOSABLE) ×1 IMPLANT
SET PAD KNEE POSITIONER (MISCELLANEOUS) ×3 IMPLANT
SPONGE GAUZE 2X2 STER 10/PKG (GAUZE/BANDAGES/DRESSINGS)
SUCTION FRAZIER 12FR DISP (SUCTIONS) ×3 IMPLANT
SUT MNCRL AB 4-0 PS2 18 (SUTURE) ×3 IMPLANT
SUT VIC AB 1 CT1 36 (SUTURE) ×3 IMPLANT
SUT VIC AB 2-0 CT1 27 (SUTURE) ×9
SUT VIC AB 2-0 CT1 TAPERPNT 27 (SUTURE) ×3 IMPLANT
SUT VLOC 180 0 24IN GS25 (SUTURE) ×3 IMPLANT
SYRINGE 60CC LL (MISCELLANEOUS) ×3 IMPLANT
TOWEL OR 17X26 10 PK STRL BLUE (TOWEL DISPOSABLE) ×3 IMPLANT
TOWEL OR NON WOVEN STRL DISP B (DISPOSABLE) IMPLANT
TRAY FOLEY CATH 14FRSI W/METER (CATHETERS) ×3 IMPLANT
WATER STERILE IRR 1500ML POUR (IV SOLUTION) ×3 IMPLANT
WRAP KNEE MAXI GEL POST OP (GAUZE/BANDAGES/DRESSINGS) ×3 IMPLANT

## 2013-08-11 NOTE — Transfer of Care (Signed)
Immediate Anesthesia Transfer of Care Note  Patient: Lauren Crosby  Procedure(s) Performed: Procedure(s) (LRB): RIGHT TOTAL KNEE ARTHROPLASTY (Right)  Patient Location: PACU  Anesthesia Type: Spinal  Level of Consciousness: sedated, patient cooperative and responds to stimulation  Airway & Oxygen Therapy: Patient Spontanous Breathing and Patient connected to face mask oxgen  Post-op Assessment: Report given to PACU RN and Post -op Vital signs reviewed and stable  Post vital signs: Reviewed and stable Complications: No apparent anesthesia complications. Noted S1 level patient denied pain on assessment with slight movement to bilat lower ext.

## 2013-08-11 NOTE — Progress Notes (Signed)
Orthopedic Tech Progress Note Patient Details:  Lauren Crosby 07-23-1931 [id number] Applied CPM to RLE. CPM Right Knee CPM Right Knee: On Right Knee Flexion (Degrees): 60 Right Knee Extension (Degrees): 0 Additional Comments:  (pt cont to deny any discomfort w/ CPM)   Darrol Poke 08/11/2013, 9:00 PM

## 2013-08-11 NOTE — Anesthesia Procedure Notes (Signed)
Spinal  Patient location during procedure: OR Start time: 08/11/2013 4:14 PM End time: 08/11/2013 4:21 PM Staffing CRNA/Resident: Anne Fu Performed by: resident/CRNA  Preanesthetic Checklist Completed: patient identified, site marked, surgical consent, pre-op evaluation, timeout performed, IV checked, risks and benefits discussed and monitors and equipment checked Spinal Block Patient position: sitting Prep: Betadine Patient monitoring: heart rate, continuous pulse ox and blood pressure Approach: right paramedian Location: L2-3 Injection technique: single-shot Needle Needle type: Spinocan  Needle gauge: 22 G Needle length: 9 cm Assessment Sensory level: T6 Additional Notes Expiration date of kit checked and confirmed. Patient tolerated procedure well, without complications. X 2 attempts with noted clear CSF return easy aspiration and administration.  Noted loss of motor and sensory on exam.

## 2013-08-11 NOTE — Anesthesia Postprocedure Evaluation (Signed)
Anesthesia Post Note  Patient: Lauren Crosby  Procedure(s) Performed: Procedure(s) (LRB): RIGHT TOTAL KNEE ARTHROPLASTY (Right)  Anesthesia type: Spinal  Patient location: PACU  Post pain: Pain level controlled  Post assessment: Post-op Vital signs reviewed  Last Vitals: BP 153/68  Pulse 91  Temp(Src) 36.5 C (Oral)  Resp 20  Ht 5\' 2"  (1.575 m)  Wt 240 lb (108.863 kg)  BMI 43.89 kg/m2  SpO2 95%  Post vital signs: Reviewed  Level of consciousness: sedated  Complications: No apparent anesthesia complications

## 2013-08-11 NOTE — Interval H&P Note (Signed)
History and Physical Interval Note:  08/11/2013 3:43 PM  Lauren Crosby  has presented today for surgery, with the diagnosis of RIGHT KNEE OA  The various methods of treatment have been discussed with the patient and family. After consideration of risks, benefits and other options for treatment, the patient has consented to  Procedure(s): RIGHT TOTAL KNEE ARTHROPLASTY (Right) as a surgical intervention .  The patient's history has been reviewed, patient examined, no change in status, stable for surgery.  I have reviewed the patient's chart and labs.  Questions were answered to the patient's satisfaction.     Mauri Pole

## 2013-08-11 NOTE — Anesthesia Preprocedure Evaluation (Addendum)
Anesthesia Evaluation  Patient identified by MRN, date of birth, ID band Patient awake    Reviewed: Allergy & Precautions, H&P , NPO status , Patient's Chart, lab work & pertinent test results  Airway Mallampati: II TM Distance: >3 FB Neck ROM: Full    Dental no notable dental hx.    Pulmonary shortness of breath and with exertion, pneumonia -,  breath sounds clear to auscultation  Pulmonary exam normal       Cardiovascular hypertension, Pt. on medications Rhythm:Regular Rate:Normal  Echo 08/2012 Left ventricle: The cavity size was normal. Wall thickness was normal. Systolic function was normal. The estimated ejection fraction was in the range of 60% to 65%. Wall motion was normal; there were no regional wall motion abnormalities. There was an increased relative contribution of atrial contraction to ventricular filling.    Neuro/Psych PSYCHIATRIC DISORDERS Anxiety Depression negative neurological ROS     GI/Hepatic Neg liver ROS, GERD-  ,(+) Hepatitis -  Endo/Other  diabetes, Type 2, Oral Hypoglycemic AgentsMorbid obesity  Renal/GU Renal InsufficiencyRenal disease     Musculoskeletal negative musculoskeletal ROS (+)   Abdominal   Peds  Hematology  (+) anemia ,   Anesthesia Other Findings   Reproductive/Obstetrics negative OB ROS                         Anesthesia Physical Anesthesia Plan  ASA: III  Anesthesia Plan:    Post-op Pain Management:    Induction:   Airway Management Planned:   Additional Equipment:   Intra-op Plan:   Post-operative Plan:   Informed Consent: I have reviewed the patients History and Physical, chart, labs and discussed the procedure including the risks, benefits and alternatives for the proposed anesthesia with the patient or authorized representative who has indicated his/her understanding and acceptance.   Dental advisory given  Plan Discussed with:  CRNA  Anesthesia Plan Comments:         Anesthesia Quick Evaluation

## 2013-08-11 NOTE — Op Note (Signed)
NAME:  Lauren Crosby                      MEDICAL RECORD NO.:  [id number]                             FACILITY:  [facility]      PHYSICIAN:  Pietro Cassis. Alvan Dame, M.D.  DATE OF BIRTH:  1931/11/18      DATE OF PROCEDURE:  08/11/2013                                     OPERATIVE REPORT         PREOPERATIVE DIAGNOSIS:  Right knee osteoarthritis.      POSTOPERATIVE DIAGNOSIS:  Right knee osteoarthritis.      FINDINGS:  The patient was noted to have complete loss of cartilage and   bone-on-bone arthritis with associated osteophytes in the medial and patellofemoral compartments of   the knee with a significant synovitis and associated effusion.      PROCEDURE:  Right total knee replacement.      COMPONENTS USED:  DePuy rotating platform posterior stabilized knee   system, a size 2.5 femur, 2.5 tibia, 12.5 mm PS insert, and 38 patellar   button.      SURGEON:  Pietro Cassis. Alvan Dame, M.D.      ASSISTANT:  Danae Orleans, PA-C.      ANESTHESIA:  Spinal.      SPECIMENS:  None.      COMPLICATION:  None.      DRAINS:  None.  EBL: <100cc      TOURNIQUET TIME:   .      The patient was stable to the recovery room.      INDICATION FOR PROCEDURE:  Lauren Crosby is a 80 y.o. female patient of   mine.  The patient had been seen, evaluated, and treated conservatively in the   office with medication, activity modification, and injections.  The patient had   radiographic changes of bone-on-bone arthritis with endplate sclerosis and osteophytes noted.      The patient failed conservative measures including medication, injections, and activity modification, and at this point was ready for more definitive measures.   Based on the radiographic changes and failed conservative measures, the patient   decided to proceed with total knee replacement.  Risks of infection,   DVT, component failure, need for revision surgery, postop course, and   expectations were all   discussed and reviewed.  Consent was  obtained for benefit of pain   relief.      PROCEDURE IN DETAIL:  The patient was brought to the operative theater.   Once adequate anesthesia, preoperative antibiotics, 900mg  of Cleocin administered, the patient was positioned supine with the right thigh tourniquet placed.  The  right lower extremity was prepped and draped in sterile fashion.  A time-   out was performed identifying the patient, planned procedure, and   extremity.      The right lower extremity was placed in the [facility] leg holder.  The leg was   exsanguinated, tourniquet elevated to 250 mmHg.  A midline incision was   made followed by median parapatellar arthrotomy.  Following initial   exposure, attention was first directed to the patella.  Precut   measurement was noted to be 20 mm.  I  resected down to 14 mm and used a   38 patellar button to restore patellar height as well as cover the cut   surface.      The lug holes were drilled and a metal shim was placed to protect the   patella from retractors and saw blades.      At this point, attention was now directed to the femur.  The femoral   canal was opened with a drill, irrigated to try to prevent fat emboli.  An   intramedullary rod was passed at 3 degrees valgus, 10 mm of bone was   resected off the distal femur.  Following this resection, the tibia was   subluxated anteriorly.  Using the extramedullary guide, 2-4 mm of bone was resected off   the proximal medial tibia.  We confirmed the gap would be   stable medially and laterally with a 10 mm insert as well as confirmed   the cut was perpendicular in the coronal plane, checking with an alignment rod.      Once this was done, I sized the femur to be a size 2.5 in the anterior-   posterior dimension, chose a standard component based on medial and   lateral dimension.  The size 2.5 rotation block was then pinned in   position anterior referenced using the C-clamp to set rotation.  The   anterior, posterior, and   chamfer cuts were made without difficulty nor   notching making certain that I was along the anterior cortex to help   with flexion gap stability.      The final box cut was made off the lateral aspect of distal femur.      At this point, the tibia was sized to be a size 2.5, the size 2.5 tray was   then pinned in position through the medial third of the tubercle,   drilled, and keel punched.  Trial reduction was now carried with a 2.5 femur,  2.5 tibia, a 10 then 12.5 mm insert, and the 38 patella botton.  The knee was brought to   extension, full extension with good flexion stability with the patella   tracking through the trochlea without application of pressure.  Given   all these findings, the trial components removed.  Final components were   opened and cement was mixed.  The knee was irrigated with normal saline   solution and pulse lavage.  The synovial lining was   then injected with 20cc of Exparel, 30cc of 0.25% Marcaine with epinephrine and 1 cc of Toradol,   total of 61 cc.      The knee was irrigated.  Final implants were then cemented onto clean and   dried cut surfaces of bone with the knee brought to extension with a 12.5 mm trial insert.      Once the cement had fully cured, the excess cement was removed   throughout the knee.  I confirmed I was satisfied with the range of   motion and stability, and the final 12.5 mm PS insert was chosen.  It was   placed into the knee.      The tourniquet had been let down at 34 minutes.  No significant   hemostasis required.  The   extensor mechanism was then reapproximated using #1 Vicryl and #0 V-lock sutures with the knee   in flexion.  The   remaining wound was closed with 2-0 Vicryl and running 4-0 Monocryl.   The  knee was cleaned, dried, dressed sterilely using Dermabond and   Aquacel dressing.  The patient was then   brought to recovery room in stable condition, tolerating the procedure   well.   Please note that  Physician Assistant, Danae Orleans, was present for the entirety of the case, and was utilized for pre-operative positioning, peri-operative retractor management, general facilitation of the procedure.  He was also utilized for primary wound closure at the end of the case.              Pietro Cassis Alvan Dame, M.D.    08/11/2013 4:14 PM

## 2013-08-12 ENCOUNTER — Encounter (HOSPITAL_COMMUNITY): Payer: Self-pay | Admitting: Orthopedic Surgery

## 2013-08-12 LAB — GLUCOSE, CAPILLARY
GLUCOSE-CAPILLARY: 217 mg/dL — AB (ref 70–99)
Glucose-Capillary: 208 mg/dL — ABNORMAL HIGH (ref 70–99)
Glucose-Capillary: 243 mg/dL — ABNORMAL HIGH (ref 70–99)
Glucose-Capillary: 250 mg/dL — ABNORMAL HIGH (ref 70–99)

## 2013-08-12 LAB — CBC
HEMATOCRIT: 25.8 % — AB (ref 36.0–46.0)
HEMOGLOBIN: 8.2 g/dL — AB (ref 12.0–15.0)
MCH: 20.6 pg — ABNORMAL LOW (ref 26.0–34.0)
MCHC: 31.8 g/dL (ref 30.0–36.0)
MCV: 64.7 fL — AB (ref 78.0–100.0)
Platelets: 141 10*3/uL — ABNORMAL LOW (ref 150–400)
RBC: 3.99 MIL/uL (ref 3.87–5.11)
RDW: 16.4 % — AB (ref 11.5–15.5)
WBC: 7.3 10*3/uL (ref 4.0–10.5)

## 2013-08-12 LAB — BASIC METABOLIC PANEL
Anion gap: 12 (ref 5–15)
BUN: 31 mg/dL — AB (ref 6–23)
CO2: 21 meq/L (ref 19–32)
CREATININE: 0.88 mg/dL (ref 0.50–1.10)
Calcium: 8.8 mg/dL (ref 8.4–10.5)
Chloride: 102 mEq/L (ref 96–112)
GFR calc non Af Amer: 61 mL/min — ABNORMAL LOW (ref >90)
GFR, EST AFRICAN AMERICAN: 71 mL/min — AB (ref >90)
Glucose, Bld: 280 mg/dL — ABNORMAL HIGH (ref 70–99)
Potassium: 6 mEq/L — ABNORMAL HIGH (ref 3.7–5.3)
Sodium: 135 mEq/L — ABNORMAL LOW (ref 137–147)

## 2013-08-12 MED ORDER — INSULIN ASPART PROT & ASPART (70-30 MIX) 100 UNIT/ML ~~LOC~~ SUSP
60.0000 [IU] | Freq: Every day | SUBCUTANEOUS | Status: DC
  Administered 2013-08-13: 60 [IU] via SUBCUTANEOUS
  Filled 2013-08-12: qty 10

## 2013-08-12 MED ORDER — INSULIN ASPART PROT & ASPART (70-30 MIX) 100 UNIT/ML ~~LOC~~ SUSP
60.0000 [IU] | Freq: Every day | SUBCUTANEOUS | Status: DC
  Administered 2013-08-12 – 2013-08-13 (×2): 60 [IU] via SUBCUTANEOUS

## 2013-08-12 NOTE — Progress Notes (Signed)
Clinical Social Work Department BRIEF PSYCHOSOCIAL ASSESSMENT 08/12/2013  Patient:  Lauren Crosby, Lauren Crosby     Account Number:  [id number]     Admit date:  08/11/2013  Clinical Social Worker:  Lacie Scotts  Date/Time:  08/12/2013 11:09 AM  Referred by:  Physician  Date Referred:  08/12/2013 Referred for  SNF Placement   Other Referral:   Interview type:  Patient Other interview type:    PSYCHOSOCIAL DATA Living Status:  HUSBAND Admitted from facility:   Level of care:   Primary support name:  Elenore Rota Primary support relationship to patient:  SPOUSE Degree of support available:   supportive    CURRENT CONCERNS Current Concerns  Post-Acute Placement   Other Concerns:    SOCIAL WORK ASSESSMENT / PLAN Pt is a 80 yr old female living at home prior to hospitalization. CSW met with pt  to assist with d/c planning. This is a planned admission. MD feels pt will need ST Rehab prior to returning home. Pt agrees with this plan. SNF search has been initiated and bed offers are pending. CSW has contacted [facility] for prior authorization. CSW will continue to follow to assist with d/c planning to SNF.   Assessment/plan status:  Psychosocial Support/Ongoing Assessment of Needs Other assessment/ plan:   Information/referral to community resources:   Insurance coverage for SNF and ambulance transport reviewed.    PATIENT'S/FAMILY'S RESPONSE TO PLAN OF CARE: Pt was hoping to return home at d/c but does agree with MD that rehab is needed. Pt hopes to find a facility close to home. Pt appreciates assistance provided by CSW.    Werner Lean LCSW [phone]

## 2013-08-12 NOTE — Progress Notes (Signed)
RT place patient on CPAP for dyspnea while sleeping per MD order. There is no history of sleep apnea. Placed patient on auto titrate min 4 and max 20. Sterile water filled water chamber for humidification.  2 liters oxygen bleed into tubing. Patient is tolerating well. RT will continue to monitor.

## 2013-08-12 NOTE — Progress Notes (Signed)
Patient refused CPAP tonight 

## 2013-08-12 NOTE — Evaluation (Signed)
Occupational Therapy Evaluation Patient Details Name: Lauren Crosby MRN: [id number] DOB: Sep 04, 1931 Today's Date: 08/12/2013    History of Present Illness Pt is a 80 year old female s/p R TKA.   Clinical Impression   Pt was admitted for the above surgery.  Prior to admission, she was independent with adls.  She now needs total A for LB dressing and mod A for LB bathing.  Did not perform toilet transfer during evaluation.  Pt would benefit from skilled OT to increase safety and independence with adls.  Goals are set for min to mod A overall.      Follow Up Recommendations  SNF    Equipment Recommendations  3 in 1 bedside comode    Recommendations for Other Services       Precautions / Restrictions Precautions Precautions: Fall;Knee Restrictions RLE Weight Bearing: Weight bearing as tolerated      Mobility Bed Mobility p              Transfers Overall transfer level: Needs assistance Equipment used: Rolling walker (2 wheeled) Transfers: Sit to/from Stand Sit to Stand: Mod assist         General transfer comment: verbal cues for safe technique including UE and LE placement    Balance                                            ADL Overall ADL's : Needs assistance/impaired     Grooming: Set up;Sitting   Upper Body Bathing: Set up;Sitting   Lower Body Bathing: Sit to/from stand;Moderate assistance   Upper Body Dressing : Set up;Sitting   Lower Body Dressing: Total assistance;Sit to/from stand                 General ADL Comments: Donned underwear sit to stand.  Pt cued to breathe during evaluation as she tends to hold breath.  Unable to release a hand in standing for adls.  Did not introduce AE on this visit, but this would be beneficial for pt.       Vision                     Perception     Praxis      Pertinent Vitals/Pain 5/10 R knee--repositioned and ice removed:  Pt did not want more.  Pt was  premedicated.     Hand Dominance     Extremity/Trunk Assessment Upper Extremity Assessment Upper Extremity Assessment: Overall WFL for tasks assessed          Communication Communication Communication: HOH   Cognition Arousal/Alertness: Awake/alert Behavior During Therapy: WFL for tasks assessed/performed Overall Cognitive Status: Within Functional Limits for tasks assessed                     General Comments       Exercises       Shoulder Instructions      Home Living Family/patient expects to be discharged to:: Skilled nursing facility                                        Prior Functioning/Environment Level of Independence: Independent with assistive device(s)        Comments: uses SPC and RW  OT Diagnosis: Generalized weakness   OT Problem List: Decreased strength;Decreased activity tolerance;Pain;Decreased knowledge of use of DME or AE   OT Treatment/Interventions: Self-care/ADL training;DME and/or AE instruction;Patient/family education;Balance training    OT Goals(Current goals can be found in the care plan section) Acute Rehab OT Goals Patient Stated Goal: rehab to regain independence OT Goal Formulation: With patient Time For Goal Achievement: 08/19/13 Potential to Achieve Goals: Good ADL Goals Pt Will Perform Grooming: with min guard assist;standing Pt Will Perform Lower Body Bathing: with min assist;with adaptive equipment;sit to/from stand Pt Will Perform Lower Body Dressing: with mod assist;with adaptive equipment;sit to/from stand Pt Will Transfer to Toilet: with min assist;stand pivot transfer;bedside commode Pt Will Perform Toileting - Clothing Manipulation and hygiene: with min assist;sit to/from stand  OT Frequency: Min 2X/week   Barriers to D/C:            Co-evaluation              End of Session    Activity Tolerance: Patient tolerated treatment well Patient left: in chair;with call  bell/phone within reach;with family/visitor present   Time: 6269-4854 OT Time Calculation (min): 17 min Charges:  OT General Charges $OT Visit: 1 Procedure OT Evaluation $Initial OT Evaluation Tier I: 1 Procedure OT Treatments $Self Care/Home Management : 8-22 mins G-Codes:    Lauren Crosby 2013/08/22, 2:51 PM  Lauren Crosby, OTR/L [phone] 08/22/2013

## 2013-08-12 NOTE — Progress Notes (Signed)
Clinical Social Work Department CLINICAL SOCIAL WORK PLACEMENT NOTE 08/12/2013  Patient:  Lauren Crosby, Lauren Crosby  Account Number:  [id number] Admit date:  08/11/2013  Clinical Social Worker:  Werner Lean, LCSW  Date/time:  08/12/2013 11:20 AM  Clinical Social Work is seeking post-discharge placement for this patient at the following level of care:   SKILLED NURSING   (*CSW will update this form in Epic as items are completed)   08/12/2013  Patient/family provided with [facility] of Clinical Social Work's list of facilities offering this level of care within the geographic area requested by the patient (or if unable, by the patient's family).  08/12/2013  Patient/family informed of their freedom to choose among providers that offer the needed level of care, that participate in Medicare, Medicaid or managed care program needed by the patient, have an available bed and are willing to accept the patient.    Patient/family informed of [facility]' ownership interest in [facility], as well as of the fact that they are under no obligation to receive care at this facility.  PASARR submitted to EDS on 08/12/2013 PASARR number received on 08/12/2013  FL2 transmitted to all facilities in geographic area requested by pt/family on  08/12/2013 FL2 transmitted to all facilities within larger geographic area on   Patient informed that his/her managed care company has contracts with or will negotiate with  certain facilities, including the following:     Patient/family informed of bed offers received:   Patient chooses bed at  Physician recommends and patient chooses bed at    Patient to be transferred to  on   Patient to be transferred to facility by  Patient and family notified of transfer on  Name of family member notified:    The following physician request were entered in Epic:   Additional Comments:  Werner Lean LCSW [phone]

## 2013-08-12 NOTE — Evaluation (Signed)
Physical Therapy Evaluation Patient Details Name: Lauren Crosby MRN: [id number] DOB: March 24, 1931 Today's Date: 08/12/2013   History of Present Illness  Pt is a 80 year old female s/p R TKA.  Clinical Impression  Pt is s/p R TKA resulting in the deficits listed below (see PT Problem List).  Pt will benefit from skilled PT to increase their independence and safety with mobility to allow discharge to the venue listed below.  Pt with limited ambulation due to pain however had reclining following for pt to rest as needed.  Pt plans to d/c to SNF.     Follow Up Recommendations SNF    Equipment Recommendations  None recommended by PT    Recommendations for Other Services       Precautions / Restrictions Precautions Precautions: Fall;Knee Restrictions RLE Weight Bearing: Weight bearing as tolerated      Mobility  Bed Mobility Overal bed mobility: Needs Assistance Bed Mobility: Supine to Sit     Supine to sit: Min assist;HOB elevated     General bed mobility comments: assist for R LE, verbal cues for technique  Transfers Overall transfer level: Needs assistance Equipment used: Rolling walker (2 wheeled) Transfers: Sit to/from Stand Sit to Stand: From elevated surface;Min assist         General transfer comment: verbal cues for safe technique including UE and LE placement  Ambulation/Gait Ambulation/Gait assistance: Min assist Ambulation Distance (Feet): 13 Feet Assistive device: Rolling walker (2 wheeled) Gait Pattern/deviations: Step-to pattern;Antalgic;Trunk flexed Gait velocity: decr   General Gait Details: 7'x1 seated break then another 6'x1, verbal cues for sequence, RW distance, posture, WBing through UEs to assist with pain control  Stairs            Wheelchair Mobility    Modified Rankin (Stroke Patients Only)       Balance                                             Pertinent Vitals/Pain Premedicated, increased pain  with ambulation R knee, activity to tolerance, repositioned, ice packs applied    Home Living Family/patient expects to be discharged to:: Skilled nursing facility                      Prior Function Level of Independence: Independent with assistive device(s)         Comments: uses SPC and RW      Hand Dominance        Extremity/Trunk Assessment               Lower Extremity Assessment: RLE deficits/detail RLE Deficits / Details: unable to perform SLR, AAROM to R knee flexion 30* limited by pain       Communication   Communication: HOH  Cognition Arousal/Alertness: Awake/alert Behavior During Therapy: WFL for tasks assessed/performed Overall Cognitive Status: Within Functional Limits for tasks assessed                      General Comments      Exercises Total Joint Exercises Ankle Circles/Pumps: AROM;Both;10 reps Quad Sets: AROM;Both;10 reps Short Arc QuadSinclair Crosby;Right;10 reps Heel Slides: AAROM;Right;10 reps Hip ABduction/ADduction: AAROM;Right;10 reps      Assessment/Plan    PT Assessment Patient needs continued PT services  PT Diagnosis Difficulty walking;Acute pain   PT Problem List Decreased strength;Decreased range  of motion;Decreased mobility;Decreased knowledge of precautions;Decreased knowledge of use of DME;Pain  PT Treatment Interventions Functional mobility training;Patient/family education;Therapeutic activities;Therapeutic exercise;Gait training;DME instruction   PT Goals (Current goals can be found in the Care Plan section) Acute Rehab PT Goals PT Goal Formulation: With patient Time For Goal Achievement: 08/16/13 Potential to Achieve Goals: Good    Frequency 7X/week   Barriers to discharge        Co-evaluation               End of Session Equipment Utilized During Treatment: Gait belt Activity Tolerance: Patient tolerated treatment well Patient left: in chair;with call bell/phone within reach            Time: 0917-0947 PT Time Calculation (min): 30 min   Charges:   PT Evaluation $Initial PT Evaluation Tier I: 1 Procedure PT Treatments $Gait Training: 8-22 mins $Therapeutic Exercise: 8-22 mins   PT G Codes:          Lauren Crosby,Lauren Crosby 08/12/2013, 12:47 PM Lauren Crosby, PT, DPT 08/12/2013 Pager: [phone]

## 2013-08-12 NOTE — Progress Notes (Signed)
Patient ID: Lauren Crosby, female   DOB: 06/10/1931, 80 y.o.   MRN: [id number] Subjective: 1 Day Post-Op Procedure(s) (LRB): RIGHT TOTAL KNEE ARTHROPLASTY (Right)    Patient reports pain as moderate.  Doing ok this am. No events.  Ready to order breakfast  Objective:   VITALS:   Filed Vitals:   08/12/13 0512  BP: 117/56  Pulse: 80  Temp: 98.1 F (36.7 C)  Resp: 16    Neurovascular intact Incision: dressing C/D/I  LABS  Recent Labs  08/12/13 0425  HGB 8.2*  HCT 25.8*  WBC 7.3  PLT 141*     Recent Labs  08/12/13 0425  NA 135*  K 6.0*  BUN 31*  CREATININE 0.88  GLUCOSE 280*    No results found for this basename: LABPT, INR,  in the last 72 hours   Assessment/Plan: 1 Day Post-Op Procedure(s) (LRB): RIGHT TOTAL KNEE ARTHROPLASTY (Right)   Advance diet Up with therapy Discharge to SNF probably tommorrow  Due to deconditioned pre-operative state I think she will be best served in skilled environment initially CPM use due to concerns of decreased motivation as well as deconditioning

## 2013-08-12 NOTE — Progress Notes (Signed)
Physical Therapy Treatment Note   08/12/13 1500  PT Visit Information  Last PT Received On 08/12/13  Assistance Needed +2  History of Present Illness Pt is a 80 year old female s/p R TKA.  PT Time Calculation  PT Start Time 1422  PT Stop Time 1434  PT Time Calculation (min) 12 min  Subjective Data  Subjective Pt attempted to ambulate again this afternoon however reporting too much pain and assisted back to bed.  Continue to recommend SNF upon d/c.  Precautions  Precautions Fall;Knee  Restrictions  RLE Weight Bearing WBAT  Cognition  Arousal/Alertness Awake/alert  Behavior During Therapy WFL for tasks assessed/performed  Overall Cognitive Status Within Functional Limits for tasks assessed  Bed Mobility  Overal bed mobility Needs Assistance  Bed Mobility Sit to Supine  Sit to supine Mod assist  General bed mobility comments assist for LEs onto bed  Transfers  Overall transfer level Needs assistance  Equipment used Rolling walker (2 wheeled)  Transfers Sit to/from Stand  Sit to Stand Mod assist;+2 safety/equipment  General transfer comment verbal cues for safe technique including UE and LE placement  Ambulation/Gait  Ambulation/Gait assistance Min assist;+2 safety/equipment  Ambulation Distance (Feet) 6 Feet  Assistive device Rolling walker (2 wheeled)  Gait Pattern/deviations Step-to pattern;Antalgic;Trunk flexed  Gait velocity decr  General Gait Details a steps forwards and pt with too much pain to continue, pt felt able to step backwards to chair  PT - End of Session  Activity Tolerance Patient limited by pain  Patient left in bed;with call bell/phone within reach;with family/visitor present  Nurse Communication Patient requests pain meds  PT - Assessment/Plan  PT Plan Current plan remains appropriate  PT Frequency 7X/week  Follow Up Recommendations SNF  PT equipment None recommended by PT  PT Goal Progression  Progress towards PT goals Progressing toward goals  PT  General Charges  $$ ACUTE PT VISIT 1 Procedure  PT Treatments  $Gait Training 8-22 mins   Carmelia Bake, PT, DPT 08/12/2013 Pager: [phone]

## 2013-08-13 DIAGNOSIS — D62 Acute posthemorrhagic anemia: Secondary | ICD-10-CM | POA: Diagnosis not present

## 2013-08-13 LAB — BASIC METABOLIC PANEL
Anion gap: 12 (ref 5–15)
BUN: 34 mg/dL — AB (ref 6–23)
CHLORIDE: 102 meq/L (ref 96–112)
CO2: 23 meq/L (ref 19–32)
Calcium: 9 mg/dL (ref 8.4–10.5)
Creatinine, Ser: 0.86 mg/dL (ref 0.50–1.10)
GFR calc Af Amer: 73 mL/min — ABNORMAL LOW (ref >90)
GFR calc non Af Amer: 63 mL/min — ABNORMAL LOW (ref >90)
GLUCOSE: 187 mg/dL — AB (ref 70–99)
Potassium: 5.4 mEq/L — ABNORMAL HIGH (ref 3.7–5.3)
Sodium: 137 mEq/L (ref 137–147)

## 2013-08-13 LAB — CBC
HEMATOCRIT: 24.5 % — AB (ref 36.0–46.0)
HEMOGLOBIN: 7.6 g/dL — AB (ref 12.0–15.0)
MCH: 20.4 pg — ABNORMAL LOW (ref 26.0–34.0)
MCHC: 31 g/dL (ref 30.0–36.0)
MCV: 65.9 fL — ABNORMAL LOW (ref 78.0–100.0)
Platelets: 145 10*3/uL — ABNORMAL LOW (ref 150–400)
RBC: 3.72 MIL/uL — ABNORMAL LOW (ref 3.87–5.11)
RDW: 16.6 % — ABNORMAL HIGH (ref 11.5–15.5)
WBC: 6.8 10*3/uL (ref 4.0–10.5)

## 2013-08-13 LAB — GLUCOSE, CAPILLARY
GLUCOSE-CAPILLARY: 160 mg/dL — AB (ref 70–99)
GLUCOSE-CAPILLARY: 165 mg/dL — AB (ref 70–99)

## 2013-08-13 MED ORDER — FERROUS SULFATE 325 (65 FE) MG PO TABS: 325.0000 mg | ORAL_TABLET | Freq: Three times a day (TID) | ORAL | Status: DC

## 2013-08-13 MED ORDER — HYDROCODONE-ACETAMINOPHEN 7.5-325 MG PO TABS: 1.0000 | ORAL_TABLET | ORAL | Status: DC | PRN

## 2013-08-13 MED ORDER — TIZANIDINE HCL 4 MG PO TABS: 4.0000 mg | ORAL_TABLET | Freq: Four times a day (QID) | ORAL | Status: DC | PRN

## 2013-08-13 MED ORDER — ASPIRIN 325 MG PO TBEC: 325.0000 mg | DELAYED_RELEASE_TABLET | Freq: Two times a day (BID) | ORAL | Status: AC

## 2013-08-13 MED ORDER — DSS 100 MG PO CAPS
100.0000 mg | ORAL_CAPSULE | Freq: Two times a day (BID) | ORAL | Status: DC
Start: 2013-08-13 — End: 2014-09-28

## 2013-08-13 MED ORDER — POLYETHYLENE GLYCOL 3350 17 G PO PACK: 17.0000 g | PACK | Freq: Two times a day (BID) | ORAL | Status: DC

## 2013-08-13 NOTE — Progress Notes (Signed)
Clinical Social Work Department CLINICAL SOCIAL WORK PLACEMENT NOTE 08/13/2013  Patient:  Lauren Crosby, Lauren Crosby  Account Number:  [id number] Admit date:  08/11/2013  Clinical Social Worker:  Werner Lean, LCSW  Date/time:  08/12/2013 11:20 AM  Clinical Social Work is seeking post-discharge placement for this patient at the following level of care:   SKILLED NURSING   (*CSW will update this form in Epic as items are completed)   08/12/2013  Patient/family provided with [place] Department of Clinical Social Work's list of facilities offering this level of care within the geographic area requested by the patient (or if unable, by the patient's family).  08/12/2013  Patient/family informed of their freedom to choose among providers that offer the needed level of care, that participate in Medicare, Medicaid or managed care program needed by the patient, have an available bed and are willing to accept the patient.    Patient/family informed of [facility]' ownership interest in [facility], as well as of the fact that they are under no obligation to receive care at this facility.  PASARR submitted to EDS on 08/12/2013 PASARR number received on 08/12/2013  FL2 transmitted to all facilities in geographic area requested by pt/family on  08/12/2013 FL2 transmitted to all facilities within larger geographic area on   Patient informed that his/her managed care company has contracts with or will negotiate with  certain facilities, including the following:     Patient/family informed of bed offers received:  08/12/2013 Patient chooses bed at [facility] Physician recommends and patient chooses bed at    Patient to be transferred to [place] on  08/13/2013 Patient to be transferred to facility by [facility] Patient and family notified of transfer on 08/13/2013 Name of family member notified:  HUSBAND  The following physician request were entered in  Epic:   Additional Comments: Pt / spouse are in agreement with d/c to SNF today. PT recommended transport by [facility]. Pt / spouse declined and pt was transported by car. PT assisted with transfer into car. CSW spoke to Admissions Director at [facility] and recommended their PT assist with transfer from car to w/c. NSG reviewed d/c summary, scripts, avs. Scripts are included in d/c packet. [employer] provided authorization for SNF placement.  Werner Lean LCSW [phone]

## 2013-08-13 NOTE — Discharge Summary (Signed)
Physician Discharge Summary  Patient ID: Lauren Crosby MRN: [id number] DOB/AGE: 10/01/1931 80 y.o.  Admit date: 08/11/2013 Discharge date:  08/13/2013  Procedures:  Procedure(s) (LRB): RIGHT TOTAL KNEE ARTHROPLASTY (Right)  Attending Physician:  Dr. Paralee Cancel   Admission Diagnoses:   Right knee OA / pain  Discharge Diagnoses:  Principal Problem:   S/P right TKA Active Problems:   Morbid obesity   Postoperative anemia due to acute blood loss  Past Medical History  Diagnosis Date  . Colon polyp     adenomatous  . Hypertension   . Hyperlipemia   . Adrenal adenoma   . Gout   . Hernia   . Thalassemia minor   . Nephrolithiasis   . Open wound of abdominal wall, anterior, without mention of complication   . Cellulitis and abscess of trunk   . Diverticulitis   . Shortness of breath     PT IS NOT ABLE TO BE ACTIVE BECAUSE OF KNEE PROBLEM; HAS ALWAYS HAD PROBLEM WITH BREATHING HEAVY - DOES GET SOB WITH EXERTION.  Marland Kitchen Pneumonia     IN THE PAST  . Diabetes     type 2  PT IS ON ORAL MEDICATION AND INSULIN  . 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 Retinopathy due to secondary diabetes mellitus   . GERD (gastroesophageal reflux disease)     NO MEDS FOR GERD  . Arthritis     OA AND PAIN BOTH KNEES BUT RIGHT KNEE PAIN WORSE; SOME LOWER BACK PAIN  . Hepatitis     YELLOW JAUNDICE WHEN A CHILD    HPI: Lauren Crosby, 80 y.o. female, has a history of pain and functional disability in the right knee due to arthritis and has failed non-surgical conservative treatments for greater than 12 weeks to includeNSAID's and/or analgesics, corticosteriod injections, viscosupplementation injections, use of assistive devices and activity modification. Onset of symptoms was gradual, starting years ago with gradually worsening course since that time. The patient noted prior procedures on the knee to include arthroscopy on the right knee(s). Patient currently rates pain in the right  knee(s) at 10 out of 10 with activity. Patient has night pain, worsening of pain with activity and weight bearing, pain that interferes with activities of daily living, pain with passive range of motion, crepitus and joint swelling. Patient has evidence of periarticular osteophytes and joint space narrowing by imaging studies. There is no active infection. Risks, benefits and expectations were discussed with the patient. Risks including but not limited to the risk of anesthesia, blood clots, nerve damage, blood vessel damage, failure of the prosthesis, infection and up to and including death. Patient understand the risks, benefits and expectations and wishes to proceed with surgery.   PCP: Sheela Stack, MD   Discharged Condition: good  Hospital Course:  Patient underwent the above stated procedure on 08/11/2013. Patient tolerated the procedure well and brought to the recovery room in good condition and subsequently to the floor.  POD #1 BP: 117/56 ; Pulse: 80 ; Temp: 98.1 F (36.7 C) ; Resp: 16  Patient reports pain as moderate. Doing ok this am. No events. Ready to order breakfast. Dorsiflexion/plantar flexion intact, incision: dressing C/D/I, no cellulitis present and compartment soft.   LABS  Basename    HGB  8.2  HCT  25.8   POD #2  BP: 146/61 ; Pulse: 86 ; Temp: 98.3 F (36.8 C) ; Resp: 16  Patient reports pain as mild, pain controlled. No events  throughout the night. Discussed SNF vs home, states that she agrees with going to a skilled nursing facility for a little while prior to going home. Dorsiflexion/plantar flexion intact, incision: dressing C/D/I, no cellulitis present and compartment soft.   LABS  Basename    HGB  7.6  HCT  24.5    Discharge Exam: General appearance: alert, cooperative and no distress Extremities: Homans sign is negative, no sign of DVT, no edema, redness or tenderness in the calves or thighs and no ulcers, gangrene or trophic changes  Disposition:      Skilled nursing facility with follow up in 2 weeks   Follow-up Information   Follow up with Mauri Pole, MD. Schedule an appointment as soon as possible for a visit in 2 weeks.   Specialty:  Orthopedic Surgery   Contact information:   [address] [place] [address] [phone]       Discharge Instructions   Call MD / Call 911    Complete by:  As directed   If you experience chest pain or shortness of breath, CALL 911 and be transported to the hospital emergency room.  If you develope a fever above 101 F, pus (white drainage) or increased drainage or redness at the wound, or calf pain, call your surgeon's office.     Change dressing    Complete by:  As directed   Maintain surgical dressing for 10-14 days, or until follow up in the clinic.     Constipation Prevention    Complete by:  As directed   Drink plenty of fluids.  Prune juice may be helpful.  You may use a stool softener, such as Colace (over the counter) 100 mg twice a day.  Use MiraLax (over the counter) for constipation as needed.     Diet - low sodium heart healthy    Complete by:  As directed      Discharge instructions    Complete by:  As directed   Maintain surgical dressing for 10-14 days, or until follow up in the clinic. Follow up in 2 weeks at [facility]. Call with any questions or concerns.     Driving restrictions    Complete by:  As directed   No driving for 4 weeks     Increase activity slowly as tolerated    Complete by:  As directed      TED hose    Complete by:  As directed   Use stockings (TED hose) for 2 weeks on both leg(s).  You may remove them at night for sleeping.     Weight bearing as tolerated    Complete by:  As directed   Laterality:  right  Extremity:  Lower             Medication List         aspirin 325 MG EC tablet  Take 1 tablet (325 mg total) by mouth 2 (two) times daily.     DSS 100 MG Caps  Take 100 mg by mouth 2 (two) times daily.      ferrous sulfate 325 (65 FE) MG tablet  Take 1 tablet (325 mg total) by mouth 3 (three) times daily after meals.     HYDROcodone-acetaminophen 7.5-325 MG per tablet  Commonly known as:  NORCO  Take 1-2 tablets by mouth every 4 (four) hours as needed for moderate pain.     insulin NPH-regular Human (70-30) 100 UNIT/ML injection  Commonly known as:  NOVOLIN  70/30  Inject 50-65 Units into the skin 2 (two) times daily with a meal.     losartan 100 MG tablet  Commonly known as:  COZAAR  Take 100 mg by mouth every evening.     metFORMIN 1000 MG tablet  Commonly known as:  GLUCOPHAGE  Take 1,000 mg by mouth 2 (two) times daily with a meal.     MULTIVITAMIN PO  Take 1 tablet by mouth daily.     PRESERVISION AREDS 2 PO  Take 1 tablet by mouth 2 (two) times daily.     polyethylene glycol packet  Commonly known as:  MIRALAX / GLYCOLAX  Take 17 g by mouth 2 (two) times daily.     simvastatin 80 MG tablet  Commonly known as:  ZOCOR  Take 80 mg by mouth at bedtime.     tiZANidine 4 MG tablet  Commonly known as:  ZANAFLEX  Take 1 tablet (4 mg total) by mouth every 6 (six) hours as needed.         Signed: West Pugh. Khambrel Amsden   PA-C  08/13/2013, 8:28 AM

## 2013-08-13 NOTE — Progress Notes (Signed)
Physical Therapy Treatment Patient Details Name: Lauren Crosby MRN: [id number] DOB: 08-07-1931 Today's Date: 08/13/2013    History of Present Illness Pt is a 80 year old female s/p R TKA.    PT Comments    Pt d/c to SNF via car vs ambulance and requires + 2 assist and increased time.  Assisted pt out or recliner to [facility] then from [facility] to car with increased time and MAX VC's on proper tech, hand placement, turn completion and safety.  Follow Up Recommendations  SNF     Equipment Recommendations  None recommended by PT    Recommendations for Other Services       Precautions / Restrictions Precautions Precautions: Fall;Knee Restrictions Weight Bearing Restrictions: No RLE Weight Bearing: Weight bearing as tolerated    Mobility  Bed Mobility               General bed mobility comments: pt up in recliner on arrival  Transfers Overall transfer level: Needs assistance Equipment used: Rolling walker (2 wheeled) Transfers: Sit to/from Stand Sit to Stand: Min assist         General transfer comment: 50% verbal cues for safe technique including UE and LE placement.  75% VC's on turn completion and safety.  Increased time.  Performed car transfer as well.  Ambulation/Gait Ambulation/Gait assistance: Min assist Ambulation Distance (Feet): 2 Feet Assistive device: Rolling walker (2 wheeled) Gait Pattern/deviations: Step-to pattern;Trunk flexed;Decreased stance time - right Gait velocity: decreased   General Gait Details: assisted from recliner to wheelchair then from wc to car.  Required increased time and cueing for safety.   Stairs            Wheelchair Mobility    Modified Rankin (Stroke Patients Only)       Balance                                    Cognition Arousal/Alertness: Awake/alert Behavior During Therapy: WFL for tasks assessed/performed Overall Cognitive Status: Within Functional Limits for tasks assessed                       Exercises Total Joint Exercises Ankle Circles/Pumps: AROM;Both;10 reps Quad Sets: AROM;Both;10 reps Towel Squeeze: AROM;Both;10 reps Short Arc QuadSinclair Ship;Right;10 reps Heel Slides: AAROM;Right;10 reps Hip ABduction/ADduction: AAROM;Right;10 reps    General Comments        Pertinent Vitals/Pain C/o 6/10 pain    Home Living                      Prior Function            PT Goals (current goals can now be found in the care plan section) Progress towards PT goals: Progressing toward goals    Frequency  7X/week    PT Plan Current plan remains appropriate    Co-evaluation             End of Session Equipment Utilized During Treatment: Gait belt Activity Tolerance: Patient limited by fatigue;Patient limited by pain Patient left: with call bell/phone within reach;in chair     Time: 1300-1315 PT Time Calculation (min): 15 min  Charges:   $Therapeutic Activity: 8-22 mins                    G Codes:      Rica Koyanagi  PTA [facility]  Acute  Rehab Pager      [phone]

## 2013-08-13 NOTE — Progress Notes (Signed)
Physical Therapy Treatment Patient Details Name: Lauren Crosby MRN: [id number] DOB: 1931-11-03 Today's Date: Aug 20, 2013    History of Present Illness Pt is a 80 year old female s/p R TKA.    PT Comments    Pt requiring increased assist to ambulate however reports pain more bearable today.  Pt also performed LE exercises.  Pt plans to d/c to SNF today.  Follow Up Recommendations  SNF     Equipment Recommendations  None recommended by PT    Recommendations for Other Services       Precautions / Restrictions Precautions Precautions: Fall;Knee Restrictions RLE Weight Bearing: Weight bearing as tolerated    Mobility  Bed Mobility               General bed mobility comments: pt up in recliner on arrival  Transfers Overall transfer level: Needs assistance Equipment used: Rolling walker (2 wheeled) Transfers: Sit to/from Stand Sit to Stand: Mod assist;+2 safety/equipment         General transfer comment: verbal cues for safe technique including UE and LE placement  Ambulation/Gait Ambulation/Gait assistance: Mod assist;+2 safety/equipment Ambulation Distance (Feet): 16 Feet Assistive device: Rolling walker (2 wheeled) Gait Pattern/deviations: Step-to pattern;Antalgic;Trunk flexed;Decreased stance time - right Gait velocity: decr   General Gait Details: 7'x1 seated break then another 9'x1, verbal cues for sequence, RW distance, posture, WBing through UEs to assist with pain control, pt reports feeling improvement in gait today   Stairs            Wheelchair Mobility    Modified Rankin (Stroke Patients Only)       Balance                                    Cognition Arousal/Alertness: Awake/alert Behavior During Therapy: WFL for tasks assessed/performed Overall Cognitive Status: Within Functional Limits for tasks assessed                      Exercises Total Joint Exercises Ankle Circles/Pumps: AROM;Both;10  reps Quad Sets: AROM;Both;10 reps Towel Squeeze: AROM;Both;10 reps Short Arc QuadSinclair Ship;Right;10 reps Heel Slides: AAROM;Right;10 reps Hip ABduction/ADduction: AAROM;Right;10 reps    General Comments        Pertinent Vitals/Pain Mod R knee pain during mobility, activity to tolerance, ice packs applied    Home Living                      Prior Function            PT Goals (current goals can now be found in the care plan section) Progress towards PT goals: Progressing toward goals    Frequency  7X/week    PT Plan Current plan remains appropriate    Co-evaluation             End of Session Equipment Utilized During Treatment: Gait belt Activity Tolerance: Patient limited by pain;Patient limited by fatigue Patient left: with call bell/phone within reach;in chair     Time: 1011-1036 PT Time Calculation (min): 25 min  Charges:  $Gait Training: 8-22 mins $Therapeutic Exercise: 8-22 mins                    G Codes:      [place] Mittman,KATHrine E 20-Aug-2013, 11:58 AM Carmelia Bake, PT, DPT August 20, 2013 Pager: [phone]

## 2013-08-13 NOTE — Progress Notes (Signed)
   Subjective: 2 Days Post-Op Procedure(s) (LRB): RIGHT TOTAL KNEE ARTHROPLASTY (Right)   Patient reports pain as mild, pain controlled. No events throughout the night. Discussed SNF vs home, states that she agrees with going to a skilled nursing facility for a little while prior to going home.  Objective:   VITALS:   Filed Vitals:   08/13/13  BP: 146/61  Pulse: 86  Temp: 98.3 F (36.8 C)   Resp: 16    Dorsiflexion/Plantar flexion intact Incision: dressing C/D/I No cellulitis present Compartment soft  LABS  Recent Labs  08/12/13 0425 08/13/13 0432  HGB 8.2* 7.6*  HCT 25.8* 24.5*  WBC 7.3 6.8  PLT 141* 145*     Recent Labs  08/12/13 0425 08/13/13 0432  NA 135* 137  K 6.0* 5.4*  BUN 31* 34*  CREATININE 0.88 0.86  GLUCOSE 280* 187*     Assessment/Plan: 2 Days Post-Op Procedure(s) (LRB): RIGHT TOTAL KNEE ARTHROPLASTY (Right) Up with therapy Discharge to SNF Follow up in 2 weeks at [facility]. Follow up with OLIN,Shulamit Donofrio D in 2 weeks.  Contact information:  [facility] [address] [place] [phone]    Expected ABLA  Treated with iron and will observe  Morbid Obesity (BMI >40)  Estimated body mass index is 43.89 kg/(m^2) as calculated from the following:   Height as of this encounter: 5\' 2"  (1.575 m).   Weight as of this encounter: 108.863 kg (240 lb). Patient also counseled that weight may inhibit the healing process Patient counseled that losing weight will help with future health issues        West Pugh. Winton Offord   PAC  08/13/2013, 8:19 AM

## 2013-08-29 ENCOUNTER — Other Ambulatory Visit (HOSPITAL_COMMUNITY): Payer: Self-pay | Admitting: Orthopedic Surgery

## 2013-08-29 ENCOUNTER — Ambulatory Visit (HOSPITAL_COMMUNITY)
Admission: RE | Admit: 2013-08-29 | Discharge: 2013-08-29 | Disposition: A | Discharge status: Home / Self Care | Payer: Medicare HMO | Source: Ambulatory Visit | Attending: Cardiovascular Disease | Admitting: Cardiovascular Disease

## 2013-08-29 DIAGNOSIS — M7989 Other specified soft tissue disorders: Secondary | ICD-10-CM

## 2013-08-29 DIAGNOSIS — Z96659 Presence of unspecified artificial knee joint: Secondary | ICD-10-CM | POA: Diagnosis present

## 2013-08-29 DIAGNOSIS — M79609 Pain in unspecified limb: Secondary | ICD-10-CM

## 2013-08-29 DIAGNOSIS — M79661 Pain in right lower leg: Secondary | ICD-10-CM

## 2013-08-29 NOTE — Progress Notes (Signed)
Right Lower Ext. Venous Duplex Completed. Preliminary results by tech - Negative DVT and SVT and the veins that were clearly visualized. The calf veins were not clearly seen in it's entirety.  Oda Cogan, BS, RDMS, RVT

## 2013-09-01 ENCOUNTER — Telehealth (HOSPITAL_COMMUNITY): Payer: Self-pay | Admitting: *Deleted

## 2013-09-05 ENCOUNTER — Telehealth (HOSPITAL_COMMUNITY): Payer: Self-pay | Admitting: *Deleted

## 2013-11-18 ENCOUNTER — Ambulatory Visit: Payer: Medicare HMO | Attending: Orthopedic Surgery | Admitting: Physical Therapy

## 2013-11-18 DIAGNOSIS — I10 Essential (primary) hypertension: Secondary | ICD-10-CM | POA: Insufficient documentation

## 2013-11-18 DIAGNOSIS — R2689 Other abnormalities of gait and mobility: Secondary | ICD-10-CM | POA: Diagnosis not present

## 2013-11-18 DIAGNOSIS — M25561 Pain in right knee: Secondary | ICD-10-CM | POA: Diagnosis present

## 2013-11-18 DIAGNOSIS — M1990 Unspecified osteoarthritis, unspecified site: Secondary | ICD-10-CM | POA: Insufficient documentation

## 2013-11-18 DIAGNOSIS — Z96651 Presence of right artificial knee joint: Secondary | ICD-10-CM | POA: Insufficient documentation

## 2013-11-18 DIAGNOSIS — M6281 Muscle weakness (generalized): Secondary | ICD-10-CM | POA: Diagnosis not present

## 2013-11-18 DIAGNOSIS — E119 Type 2 diabetes mellitus without complications: Secondary | ICD-10-CM | POA: Diagnosis not present

## 2013-11-18 DIAGNOSIS — R0600 Dyspnea, unspecified: Secondary | ICD-10-CM | POA: Diagnosis not present

## 2013-11-18 DIAGNOSIS — M25661 Stiffness of right knee, not elsewhere classified: Secondary | ICD-10-CM | POA: Diagnosis not present

## 2013-11-25 ENCOUNTER — Ambulatory Visit: Payer: Medicare HMO | Admitting: Rehabilitation

## 2013-11-25 DIAGNOSIS — M25561 Pain in right knee: Secondary | ICD-10-CM | POA: Diagnosis not present

## 2013-11-26 ENCOUNTER — Ambulatory Visit: Payer: Medicare HMO | Admitting: Rehabilitation

## 2013-11-26 DIAGNOSIS — M25561 Pain in right knee: Secondary | ICD-10-CM | POA: Diagnosis not present

## 2013-11-28 ENCOUNTER — Ambulatory Visit: Payer: Medicare HMO | Admitting: Physical Therapy

## 2013-12-02 ENCOUNTER — Ambulatory Visit: Payer: Medicare HMO | Admitting: Rehabilitation

## 2013-12-02 DIAGNOSIS — M25561 Pain in right knee: Secondary | ICD-10-CM | POA: Diagnosis not present

## 2013-12-03 ENCOUNTER — Ambulatory Visit: Payer: Medicare HMO | Admitting: Physical Therapy

## 2013-12-03 DIAGNOSIS — M25561 Pain in right knee: Secondary | ICD-10-CM | POA: Diagnosis not present

## 2013-12-08 ENCOUNTER — Ambulatory Visit: Payer: Medicare HMO | Admitting: Rehabilitation

## 2013-12-08 DIAGNOSIS — M25561 Pain in right knee: Secondary | ICD-10-CM | POA: Diagnosis not present

## 2013-12-10 ENCOUNTER — Encounter: Payer: Commercial Managed Care - HMO | Admitting: Physical Therapy

## 2013-12-12 ENCOUNTER — Encounter: Payer: Commercial Managed Care - HMO | Admitting: Physical Therapy

## 2013-12-15 ENCOUNTER — Ambulatory Visit: Payer: Medicare HMO | Attending: Orthopedic Surgery | Admitting: Physical Therapy

## 2013-12-15 DIAGNOSIS — Z96651 Presence of right artificial knee joint: Secondary | ICD-10-CM | POA: Diagnosis not present

## 2013-12-15 DIAGNOSIS — E119 Type 2 diabetes mellitus without complications: Secondary | ICD-10-CM | POA: Insufficient documentation

## 2013-12-15 DIAGNOSIS — M6281 Muscle weakness (generalized): Secondary | ICD-10-CM | POA: Insufficient documentation

## 2013-12-15 DIAGNOSIS — R0600 Dyspnea, unspecified: Secondary | ICD-10-CM | POA: Insufficient documentation

## 2013-12-15 DIAGNOSIS — M1990 Unspecified osteoarthritis, unspecified site: Secondary | ICD-10-CM | POA: Diagnosis not present

## 2013-12-15 DIAGNOSIS — R2689 Other abnormalities of gait and mobility: Secondary | ICD-10-CM | POA: Insufficient documentation

## 2013-12-15 DIAGNOSIS — I10 Essential (primary) hypertension: Secondary | ICD-10-CM | POA: Insufficient documentation

## 2013-12-15 DIAGNOSIS — M25661 Stiffness of right knee, not elsewhere classified: Secondary | ICD-10-CM | POA: Diagnosis not present

## 2013-12-15 DIAGNOSIS — M25561 Pain in right knee: Secondary | ICD-10-CM | POA: Diagnosis not present

## 2013-12-17 ENCOUNTER — Encounter: Payer: Commercial Managed Care - HMO | Admitting: Rehabilitation

## 2013-12-19 ENCOUNTER — Encounter: Payer: Commercial Managed Care - HMO | Admitting: Physical Therapy

## 2013-12-22 ENCOUNTER — Ambulatory Visit: Payer: Medicare HMO | Admitting: Rehabilitation

## 2013-12-24 ENCOUNTER — Ambulatory Visit: Payer: Medicare HMO | Admitting: Physical Therapy

## 2013-12-26 ENCOUNTER — Encounter: Payer: Commercial Managed Care - HMO | Admitting: Physical Therapy

## 2013-12-30 ENCOUNTER — Ambulatory Visit: Payer: Medicare HMO

## 2013-12-30 DIAGNOSIS — M25561 Pain in right knee: Secondary | ICD-10-CM | POA: Diagnosis not present

## 2014-01-05 ENCOUNTER — Ambulatory Visit: Payer: Medicare HMO | Admitting: Physical Therapy

## 2014-01-07 ENCOUNTER — Ambulatory Visit: Payer: Medicare HMO | Admitting: Rehabilitation

## 2014-01-12 ENCOUNTER — Ambulatory Visit: Payer: Commercial Managed Care - HMO

## 2014-01-12 DIAGNOSIS — R05 Cough: Secondary | ICD-10-CM | POA: Diagnosis not present

## 2014-01-12 DIAGNOSIS — J209 Acute bronchitis, unspecified: Secondary | ICD-10-CM | POA: Diagnosis not present

## 2014-01-12 DIAGNOSIS — I10 Essential (primary) hypertension: Secondary | ICD-10-CM | POA: Diagnosis not present

## 2014-01-12 DIAGNOSIS — E1129 Type 2 diabetes mellitus with other diabetic kidney complication: Secondary | ICD-10-CM | POA: Diagnosis not present

## 2014-01-12 DIAGNOSIS — Z6841 Body Mass Index (BMI) 40.0 and over, adult: Secondary | ICD-10-CM | POA: Diagnosis not present

## 2014-01-15 ENCOUNTER — Ambulatory Visit: Payer: Commercial Managed Care - HMO | Admitting: Rehabilitation

## 2014-01-20 DIAGNOSIS — Z471 Aftercare following joint replacement surgery: Secondary | ICD-10-CM | POA: Diagnosis not present

## 2014-01-20 DIAGNOSIS — Z96651 Presence of right artificial knee joint: Secondary | ICD-10-CM | POA: Diagnosis not present

## 2014-02-03 ENCOUNTER — Ambulatory Visit: Payer: Commercial Managed Care - HMO | Attending: Orthopedic Surgery

## 2014-02-03 DIAGNOSIS — R2689 Other abnormalities of gait and mobility: Secondary | ICD-10-CM | POA: Insufficient documentation

## 2014-02-03 DIAGNOSIS — Z96651 Presence of right artificial knee joint: Secondary | ICD-10-CM | POA: Diagnosis not present

## 2014-02-03 DIAGNOSIS — E119 Type 2 diabetes mellitus without complications: Secondary | ICD-10-CM | POA: Insufficient documentation

## 2014-02-03 DIAGNOSIS — M6281 Muscle weakness (generalized): Secondary | ICD-10-CM | POA: Diagnosis not present

## 2014-02-03 DIAGNOSIS — M25561 Pain in right knee: Secondary | ICD-10-CM | POA: Insufficient documentation

## 2014-02-03 DIAGNOSIS — M25661 Stiffness of right knee, not elsewhere classified: Secondary | ICD-10-CM | POA: Diagnosis not present

## 2014-02-03 DIAGNOSIS — M1990 Unspecified osteoarthritis, unspecified site: Secondary | ICD-10-CM | POA: Diagnosis not present

## 2014-02-03 DIAGNOSIS — I10 Essential (primary) hypertension: Secondary | ICD-10-CM | POA: Diagnosis not present

## 2014-02-03 DIAGNOSIS — R0600 Dyspnea, unspecified: Secondary | ICD-10-CM | POA: Diagnosis not present

## 2014-02-10 ENCOUNTER — Ambulatory Visit: Payer: Commercial Managed Care - HMO | Attending: Orthopedic Surgery

## 2014-02-10 DIAGNOSIS — I10 Essential (primary) hypertension: Secondary | ICD-10-CM | POA: Diagnosis not present

## 2014-02-10 DIAGNOSIS — R2689 Other abnormalities of gait and mobility: Secondary | ICD-10-CM | POA: Insufficient documentation

## 2014-02-10 DIAGNOSIS — M6281 Muscle weakness (generalized): Secondary | ICD-10-CM | POA: Insufficient documentation

## 2014-02-10 DIAGNOSIS — M25661 Stiffness of right knee, not elsewhere classified: Secondary | ICD-10-CM | POA: Insufficient documentation

## 2014-02-10 DIAGNOSIS — M25561 Pain in right knee: Secondary | ICD-10-CM | POA: Diagnosis not present

## 2014-02-10 DIAGNOSIS — R0600 Dyspnea, unspecified: Secondary | ICD-10-CM | POA: Diagnosis not present

## 2014-02-10 DIAGNOSIS — M1990 Unspecified osteoarthritis, unspecified site: Secondary | ICD-10-CM | POA: Diagnosis not present

## 2014-02-10 DIAGNOSIS — Z96651 Presence of right artificial knee joint: Secondary | ICD-10-CM | POA: Diagnosis not present

## 2014-02-10 DIAGNOSIS — E119 Type 2 diabetes mellitus without complications: Secondary | ICD-10-CM | POA: Insufficient documentation

## 2014-02-11 DIAGNOSIS — E785 Hyperlipidemia, unspecified: Secondary | ICD-10-CM | POA: Diagnosis not present

## 2014-02-11 DIAGNOSIS — Z96659 Presence of unspecified artificial knee joint: Secondary | ICD-10-CM | POA: Diagnosis not present

## 2014-02-11 DIAGNOSIS — I10 Essential (primary) hypertension: Secondary | ICD-10-CM | POA: Diagnosis not present

## 2014-02-11 DIAGNOSIS — E049 Nontoxic goiter, unspecified: Secondary | ICD-10-CM | POA: Diagnosis not present

## 2014-02-11 DIAGNOSIS — N08 Glomerular disorders in diseases classified elsewhere: Secondary | ICD-10-CM | POA: Diagnosis not present

## 2014-02-11 DIAGNOSIS — D649 Anemia, unspecified: Secondary | ICD-10-CM | POA: Diagnosis not present

## 2014-02-11 DIAGNOSIS — N200 Calculus of kidney: Secondary | ICD-10-CM | POA: Diagnosis not present

## 2014-02-11 DIAGNOSIS — E1129 Type 2 diabetes mellitus with other diabetic kidney complication: Secondary | ICD-10-CM | POA: Diagnosis not present

## 2014-02-12 ENCOUNTER — Ambulatory Visit: Payer: Commercial Managed Care - HMO

## 2014-02-12 DIAGNOSIS — M25661 Stiffness of right knee, not elsewhere classified: Secondary | ICD-10-CM | POA: Diagnosis not present

## 2014-02-12 DIAGNOSIS — R0600 Dyspnea, unspecified: Secondary | ICD-10-CM | POA: Diagnosis not present

## 2014-02-12 DIAGNOSIS — E119 Type 2 diabetes mellitus without complications: Secondary | ICD-10-CM | POA: Diagnosis not present

## 2014-02-12 DIAGNOSIS — M1990 Unspecified osteoarthritis, unspecified site: Secondary | ICD-10-CM | POA: Diagnosis not present

## 2014-02-12 DIAGNOSIS — I10 Essential (primary) hypertension: Secondary | ICD-10-CM | POA: Diagnosis not present

## 2014-02-12 DIAGNOSIS — R2689 Other abnormalities of gait and mobility: Secondary | ICD-10-CM | POA: Diagnosis not present

## 2014-02-12 DIAGNOSIS — M6281 Muscle weakness (generalized): Secondary | ICD-10-CM | POA: Diagnosis not present

## 2014-02-12 DIAGNOSIS — M25561 Pain in right knee: Secondary | ICD-10-CM | POA: Diagnosis not present

## 2014-02-12 DIAGNOSIS — Z96651 Presence of right artificial knee joint: Secondary | ICD-10-CM | POA: Diagnosis not present

## 2014-02-17 ENCOUNTER — Ambulatory Visit: Payer: Commercial Managed Care - HMO | Admitting: Rehabilitation

## 2014-02-17 DIAGNOSIS — I10 Essential (primary) hypertension: Secondary | ICD-10-CM | POA: Diagnosis not present

## 2014-02-17 DIAGNOSIS — R0600 Dyspnea, unspecified: Secondary | ICD-10-CM | POA: Diagnosis not present

## 2014-02-17 DIAGNOSIS — M25661 Stiffness of right knee, not elsewhere classified: Secondary | ICD-10-CM | POA: Diagnosis not present

## 2014-02-17 DIAGNOSIS — M1990 Unspecified osteoarthritis, unspecified site: Secondary | ICD-10-CM | POA: Diagnosis not present

## 2014-02-17 DIAGNOSIS — R2689 Other abnormalities of gait and mobility: Secondary | ICD-10-CM | POA: Diagnosis not present

## 2014-02-17 DIAGNOSIS — M25561 Pain in right knee: Secondary | ICD-10-CM | POA: Diagnosis not present

## 2014-02-17 DIAGNOSIS — Z96651 Presence of right artificial knee joint: Secondary | ICD-10-CM | POA: Diagnosis not present

## 2014-02-17 DIAGNOSIS — E119 Type 2 diabetes mellitus without complications: Secondary | ICD-10-CM | POA: Diagnosis not present

## 2014-02-17 DIAGNOSIS — M6281 Muscle weakness (generalized): Secondary | ICD-10-CM | POA: Diagnosis not present

## 2014-02-19 ENCOUNTER — Ambulatory Visit: Payer: Commercial Managed Care - HMO | Admitting: Rehabilitation

## 2014-02-19 DIAGNOSIS — E119 Type 2 diabetes mellitus without complications: Secondary | ICD-10-CM | POA: Diagnosis not present

## 2014-02-19 DIAGNOSIS — I10 Essential (primary) hypertension: Secondary | ICD-10-CM | POA: Diagnosis not present

## 2014-02-19 DIAGNOSIS — R2689 Other abnormalities of gait and mobility: Secondary | ICD-10-CM | POA: Diagnosis not present

## 2014-02-19 DIAGNOSIS — M1990 Unspecified osteoarthritis, unspecified site: Secondary | ICD-10-CM | POA: Diagnosis not present

## 2014-02-19 DIAGNOSIS — Z96651 Presence of right artificial knee joint: Secondary | ICD-10-CM | POA: Diagnosis not present

## 2014-02-19 DIAGNOSIS — R0600 Dyspnea, unspecified: Secondary | ICD-10-CM | POA: Diagnosis not present

## 2014-02-19 DIAGNOSIS — M25661 Stiffness of right knee, not elsewhere classified: Secondary | ICD-10-CM | POA: Diagnosis not present

## 2014-02-19 DIAGNOSIS — M25561 Pain in right knee: Secondary | ICD-10-CM | POA: Diagnosis not present

## 2014-02-19 DIAGNOSIS — M6281 Muscle weakness (generalized): Secondary | ICD-10-CM | POA: Diagnosis not present

## 2014-02-23 ENCOUNTER — Ambulatory Visit: Payer: Commercial Managed Care - HMO | Admitting: Rehabilitation

## 2014-02-23 DIAGNOSIS — M1990 Unspecified osteoarthritis, unspecified site: Secondary | ICD-10-CM | POA: Diagnosis not present

## 2014-02-23 DIAGNOSIS — M25561 Pain in right knee: Secondary | ICD-10-CM | POA: Diagnosis not present

## 2014-02-23 DIAGNOSIS — R2689 Other abnormalities of gait and mobility: Secondary | ICD-10-CM | POA: Diagnosis not present

## 2014-02-23 DIAGNOSIS — M25661 Stiffness of right knee, not elsewhere classified: Secondary | ICD-10-CM | POA: Diagnosis not present

## 2014-02-23 DIAGNOSIS — M6281 Muscle weakness (generalized): Secondary | ICD-10-CM | POA: Diagnosis not present

## 2014-02-23 DIAGNOSIS — I10 Essential (primary) hypertension: Secondary | ICD-10-CM | POA: Diagnosis not present

## 2014-02-23 DIAGNOSIS — Z96651 Presence of right artificial knee joint: Secondary | ICD-10-CM | POA: Diagnosis not present

## 2014-02-23 DIAGNOSIS — R0600 Dyspnea, unspecified: Secondary | ICD-10-CM | POA: Diagnosis not present

## 2014-02-23 DIAGNOSIS — E119 Type 2 diabetes mellitus without complications: Secondary | ICD-10-CM | POA: Diagnosis not present

## 2014-02-26 ENCOUNTER — Ambulatory Visit: Payer: Commercial Managed Care - HMO | Admitting: Rehabilitation

## 2014-02-26 VITALS — BP 148/82 | HR 99

## 2014-02-26 DIAGNOSIS — M6281 Muscle weakness (generalized): Secondary | ICD-10-CM

## 2014-02-26 DIAGNOSIS — M25661 Stiffness of right knee, not elsewhere classified: Secondary | ICD-10-CM

## 2014-02-26 DIAGNOSIS — I10 Essential (primary) hypertension: Secondary | ICD-10-CM | POA: Diagnosis not present

## 2014-02-26 DIAGNOSIS — M1990 Unspecified osteoarthritis, unspecified site: Secondary | ICD-10-CM | POA: Diagnosis not present

## 2014-02-26 DIAGNOSIS — M25561 Pain in right knee: Secondary | ICD-10-CM

## 2014-02-26 DIAGNOSIS — R2689 Other abnormalities of gait and mobility: Secondary | ICD-10-CM

## 2014-02-26 DIAGNOSIS — R0600 Dyspnea, unspecified: Secondary | ICD-10-CM | POA: Diagnosis not present

## 2014-02-26 DIAGNOSIS — Z96651 Presence of right artificial knee joint: Secondary | ICD-10-CM | POA: Diagnosis not present

## 2014-02-26 DIAGNOSIS — E119 Type 2 diabetes mellitus without complications: Secondary | ICD-10-CM | POA: Diagnosis not present

## 2014-02-26 NOTE — Therapy (Signed)
[facility] [address]  [place], [place], [address] Phone: [phone]   Fax:  [phone]  Physical Therapy Treatment  Patient Details  Name: Lauren Crosby MRN: [id number] Date of Birth: 03/26/1931 Referring Provider:  Mauri Pole, MD  Encounter Date: 02/26/2014      PT End of Session - 02/26/14 1527    Visit Number [id number]   Date for PT Re-Evaluation 03/06/24   PT Start Time 6834   PT Stop Time 1538   PT Time Calculation (min) 55 min      Past Medical History  Diagnosis Date  . Colon polyp     adenomatous  . Hypertension   . Hyperlipemia   . Adrenal adenoma   . Gout   . Hernia   . Thalassemia minor   . Nephrolithiasis   . Open wound of abdominal wall, anterior, without mention of complication   . Cellulitis and abscess of trunk   . Diverticulitis   . Shortness of breath     PT IS NOT ABLE TO BE ACTIVE BECAUSE OF KNEE PROBLEM; HAS ALWAYS HAD PROBLEM WITH BREATHING HEAVY - DOES GET SOB WITH EXERTION.  Marland Kitchen Pneumonia     IN THE PAST  . Diabetes     type 2  PT IS ON ORAL MEDICATION AND INSULIN  . 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 Retinopathy due to secondary diabetes mellitus   . GERD (gastroesophageal reflux disease)     NO MEDS FOR GERD  . Arthritis     OA AND PAIN BOTH KNEES BUT RIGHT KNEE PAIN WORSE; SOME LOWER BACK PAIN  . Hepatitis     YELLOW JAUNDICE WHEN A CHILD    Past Surgical History  Procedure Laterality Date  . Cholecystectomy    . Sigmoid resection / rectopexy    . Ventral hernia repair  2007  . Total abdominal hysterectomy    . Rotator cuff repair    . Cardiac catheterization  2008    conclusion: smooth and normal coronary arteries, normal left ventricular systolic function  . Colonoscopy  10/04/06  . Diagnostic mammogram  03/17/2011  . Tonsillectomy    . Appendectomy      1993 PT HAD APPENDECTOMY AND ABDOMINAL SURGERY FRO DIVERTICULITS  . Total knee  arthroplasty Right 08/11/2013    Procedure: RIGHT TOTAL KNEE ARTHROPLASTY;  Surgeon: Mauri Pole, MD;  Location: [facility];  Service: Orthopedics;  Laterality: Right;    BP 148/82 mmHg  Pulse 99  SpO2 96%  Visit Diagnosis:  Pain in right knee  Stiffness of right knee  Impaired gait and mobility  Muscle weakness      Subjective Assessment - 02/26/14 1447    Symptoms Reports no pain in her Rt knee but noted some increased Lt knee pain after last time. States she is very limited with walking/standing activities but is doing more around the house and making more trips outside of the house with her husband. Reports no pain with walking but needs something to hold on to.    How long can you stand comfortably? 5 minutes.    Currently in Pain? No/denies          [facility] PT Assessment - 02/26/14 0001    AROM   Right Knee Extension 9   Right Knee Flexion 110                  [facility] Adult PT Treatment/Exercise -  02/26/14 1452    Exercises   Exercises Knee/Hip   Knee/Hip Exercises: Aerobic   Stationary Bike Nustep level 4 x 8 minutes UE/LE    Knee/Hip Exercises: Standing   Forward Step Up Step Height: 6";Hand Hold: 2;2 sets;5 reps   Knee/Hip Exercises: Seated   Other Seated Knee Exercises hamstring curls green TB 2x 12 Rt   Other Seated Knee Exercises march x10 Rt   Knee/Hip Exercises: Supine   Heel Slides 2 sets;10 reps  feet on peanut ball   Terminal Knee Extension 2 sets;5 reps;Right;Other (comment)  black bolster   Straight Leg Raises 2 sets;5 reps;Right   Modalities   Modalities Patent attorney LAQ with 4# ankle weight   Electrical Stimulation Parameters Russian, co-contract, 10" on/ 10" off, x 12 minutes                  PT Short Term Goals - 02/26/14 1436    PT SHORT TERM GOAL #1   Title Verbalize 5/5 fall precautions in order to reduce the  risk for falls. 12/02/13   Time 4   Period Weeks   Status On-going           PT Long Term Goals - 02/26/14 1437    PT LONG TERM GOAL #1   Title be independent with HEP. 03/06/14   Time 4   Period Weeks   Status On-going   PT LONG TERM GOAL #2   Title improve AROM of the Rt knee to 5-115 degrees in order to improve joint mobility during ambulation and transfer. 03/06/14   Time 4   Period Weeks   Status On-going   PT LONG TERM GOAL #3   Title report pain decrease of the Rt knee to 0-4/10 on VAS. 03/06/14   Time 4   Period Weeks   Status On-going   PT LONG TERM GOAL #4   Title increase R hip/knee strength to >4/5 MMT grade in order to improve ability to complete functional activities. 03/06/14   Time 4   Period Weeks   Status On-going   PT LONG TERM GOAL #5   Title improve TUG time to <30 seconds in order to reduce the risk for falls and improve household ambulation. 03/06/14   Time 4   Period Weeks   Status On-going   Additional Long Term Goals   Additional Long Term Goals Yes   PT LONG TERM GOAL #6   Title ambulate >300 feet with the use of a RW with distant supervision with improved heel to toe sequence demonstrated in order to improve household ambulation and progress towards community ambulation.  03/06/14   Time 4   Period Weeks   Status On-going               Plan - 02/26/14 1529    Clinical Impression Statement Patient continues to have worsened SOB limiting her therapy treatment and her Lt knee also prevented her from performing more standing/walking activities. Needed increased rest time in between exercises and verbal cues to not hold her breath during exercises. Continues to respond well to [place] electrical stimulation for her Rt quad.   Pt will benefit from skilled therapeutic intervention in order to improve on the following deficits Abnormal gait;Decreased range of motion;Decreased mobility;Decreased strength;Decreased activity tolerance;Decreased  endurance;Difficulty walking   Rehab Potential Good   PT Frequency 2x / week   PT  Duration 4 weeks   PT Treatment/Interventions Cryotherapy;Moist Heat;Ultrasound;Electrical Stimulation;Passive range of motion;Manual techniques;Patient/family education;Therapeutic activities;Therapeutic exercise;Gait training;Neuromuscular re-education   PT Next Visit Plan Assess, POC is up next week.    Consulted and Agree with Plan of Care Patient        Problem List Patient Active Problem List   Diagnosis Date Noted  . Morbid obesity 08/13/2013  . Postoperative anemia due to acute blood loss 08/13/2013  . S/P right TKA 08/11/2013  . Preop cardiovascular exam 08/14/2012  . Chest pain 08/14/2012  . Suture granuloma 02/09/2012  . DYSPNEA ON EXERTION 10/04/2009  . DIABETES MELLITUS, TYPE II 04/15/2007  . HYPERLIPIDEMIA 04/15/2007  . OBESITY 04/15/2007  . ANEMIA, CHRONIC 04/15/2007  . ANXIETY 04/15/2007  . DEPRESSION 04/15/2007  . HYPERTENSION 04/15/2007  . GERD 04/15/2007  . DEGENERATIVE JOINT DISEASE 04/15/2007  . HELICOBACTER PYLORI INFECTION, HX OF 04/15/2007  . COLONIC POLYPS 10/04/2006  . DIVERTICULOSIS, COLON 10/04/2006  . REFLUX ESOPHAGITIS 08/22/2001  . GASTRITIS, ACUTE 08/22/2001    Erlene Senters J PTA 02/26/2014, 3:38 PM  [facility] [address]  [place], [place], [address] Phone: [phone]   Fax:  [phone]

## 2014-03-02 ENCOUNTER — Ambulatory Visit: Payer: Commercial Managed Care - HMO

## 2014-03-02 VITALS — BP 154/80 | HR 94

## 2014-03-02 DIAGNOSIS — R0600 Dyspnea, unspecified: Secondary | ICD-10-CM | POA: Diagnosis not present

## 2014-03-02 DIAGNOSIS — M25561 Pain in right knee: Secondary | ICD-10-CM | POA: Diagnosis not present

## 2014-03-02 DIAGNOSIS — M6281 Muscle weakness (generalized): Secondary | ICD-10-CM

## 2014-03-02 DIAGNOSIS — I10 Essential (primary) hypertension: Secondary | ICD-10-CM | POA: Diagnosis not present

## 2014-03-02 DIAGNOSIS — M25661 Stiffness of right knee, not elsewhere classified: Secondary | ICD-10-CM | POA: Diagnosis not present

## 2014-03-02 DIAGNOSIS — R2689 Other abnormalities of gait and mobility: Secondary | ICD-10-CM

## 2014-03-02 DIAGNOSIS — E119 Type 2 diabetes mellitus without complications: Secondary | ICD-10-CM | POA: Diagnosis not present

## 2014-03-02 DIAGNOSIS — Z96651 Presence of right artificial knee joint: Secondary | ICD-10-CM | POA: Diagnosis not present

## 2014-03-02 DIAGNOSIS — M1990 Unspecified osteoarthritis, unspecified site: Secondary | ICD-10-CM | POA: Diagnosis not present

## 2014-03-02 NOTE — Therapy (Signed)
[facility] [address]  [place], [place], [address] Phone: [phone]   Fax:  [phone]  Physical Therapy Treatment  Patient Details  Name: Lauren Crosby MRN: [id number] Date of Birth: 17-Aug-1931 Referring Provider:  Mauri Pole, MD  Encounter Date: 03/02/2014      PT End of Session - 03/02/14 1518    Visit Number 17   Date for PT Re-Evaluation 03/06/24   PT Start Time 1428   PT Stop Time 1530   PT Time Calculation (min) 62 min   Activity Tolerance Patient tolerated treatment well   Behavior During Therapy [facility] for tasks assessed/performed      Past Medical History  Diagnosis Date  . Colon polyp     adenomatous  . Hypertension   . Hyperlipemia   . Adrenal adenoma   . Gout   . Hernia   . Thalassemia minor   . Nephrolithiasis   . Open wound of abdominal wall, anterior, without mention of complication   . Cellulitis and abscess of trunk   . Diverticulitis   . Shortness of breath     PT IS NOT ABLE TO BE ACTIVE BECAUSE OF KNEE PROBLEM; HAS ALWAYS HAD PROBLEM WITH BREATHING HEAVY - DOES GET SOB WITH EXERTION.  Marland Kitchen Pneumonia     IN THE PAST  . Diabetes     type 2  PT IS ON ORAL MEDICATION AND INSULIN  . 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 Retinopathy due to secondary diabetes mellitus   . GERD (gastroesophageal reflux disease)     NO MEDS FOR GERD  . Arthritis     OA AND PAIN BOTH KNEES BUT RIGHT KNEE PAIN WORSE; SOME LOWER BACK PAIN  . Hepatitis     YELLOW JAUNDICE WHEN A CHILD    Past Surgical History  Procedure Laterality Date  . Cholecystectomy    . Sigmoid resection / rectopexy    . Ventral hernia repair  2007  . Total abdominal hysterectomy    . Rotator cuff repair    . Cardiac catheterization  2008    conclusion: smooth and normal coronary arteries, normal left ventricular systolic function  . Colonoscopy  10/04/06  . Diagnostic mammogram  03/17/2011  .  Tonsillectomy    . Appendectomy      1993 PT HAD APPENDECTOMY AND ABDOMINAL SURGERY FRO DIVERTICULITS  . Total knee arthroplasty Right 08/11/2013    Procedure: RIGHT TOTAL KNEE ARTHROPLASTY;  Surgeon: Mauri Pole, MD;  Location: [facility];  Service: Orthopedics;  Laterality: Right;    BP 154/80 mmHg  Pulse 94  SpO2 94%  Visit Diagnosis:  Pain in right knee  Stiffness of right knee  Impaired gait and mobility  Muscle weakness      Subjective Assessment - 03/02/14 1431    Symptoms Patient reports no new changes.  Did take pain medication today due to left knee pain.  Was watching TV just prior to coming to therapy; doesn't know why her BP is elevated.   Currently in Pain? No/denies   Pain Score --  up to 7/10 left knee prior to taking meds          [facility] PT Assessment - 03/02/14 0001    AROM   AROM Assessment Site Knee   Right/Left Knee Right   Right Knee Extension 10   Right Knee Flexion 112   Standardized Balance Assessment   Standardized Balance Assessment Timed Up  and Go Test  with walker in 24 seconds                  [facility] Adult PT Treatment/Exercise - 03/02/14 0001    Ambulation/Gait   Ambulation/Gait Yes   Ambulation/Gait Assistance 5: Supervision   Ambulation/Gait Assistance Details supervision with chair following for safety; cue for posture   Ambulation Distance (Feet) 190 Feet   Assistive device Rolling walker   Gait Pattern Wide base of support;Trunk flexed;Decreased stride length;Step-through pattern   Knee/Hip Exercises: Aerobic   Stationary Bike Nustep level 4 x 8 minutes UE/LE    Knee/Hip Exercises: Standing   Heel Raises 1 set;10 reps   Knee Flexion Strengthening;1 set;10 reps;Both   Forward Step Up Both;2 sets;5 reps;Hand Hold: 2;Step Height: 6"   Research scientist (life sciences) LAQ w/ 4# weight   Electrical Stimulation Parameters Russian co-contract 10"on/10" off x 15  min   Electrical Stimulation Goals Strength                PT Education - 03/02/14 1543    Education provided Yes   Education Details plan for HEP and walking program after PT d/c   Person(s) Educated Patient;Spouse   Methods Explanation   Comprehension Need further instruction          PT Short Term Goals - 03/02/14 1545    PT SHORT TERM GOAL #1   Title Verbalize 5/5 fall precautions in order to reduce the risk for falls. 12/02/13   Status On-going           PT Long Term Goals - 03/02/14 1545    PT LONG TERM GOAL #1   Title be independent with HEP. 03/06/14   Status On-going   PT LONG TERM GOAL #2   Title improve AROM of the Rt knee to 5-115 degrees in order to improve joint mobility during ambulation and transfer. 03/06/14   Status On-going   PT LONG TERM GOAL #3   Title report pain decrease of the Rt knee to 0-4/10 on VAS. 03/06/14   Status On-going   PT LONG TERM GOAL #4   Title increase R hip/knee strength to >4/5 MMT grade in order to improve ability to complete functional activities. 03/06/14   Status On-going   PT LONG TERM GOAL #5   Title improve TUG time to <30 seconds in order to reduce the risk for falls and improve household ambulation. 03/06/14   Status Achieved   PT LONG TERM GOAL #6   Title ambulate >300 feet with the use of a RW with distant supervision with improved heel to toe sequence demonstrated in order to improve household ambulation and progress towards community ambulation.  03/06/14   Status On-going               Plan - 03/02/14 1544    Clinical Impression Statement Patient improved with TUG with goal met and seems able to walk farther this session without need for rest.  Elevated BP beginning of session, but seemed to go down with activity.  Will need specific HEP and walking program prior to d/c as well as fall prevention education.   PT Next Visit Plan HEP update, walking program and falls education   Consulted and Agree with  Plan of Care Patient;Family member/caregiver   Family Member Consulted spouse        Problem List Patient Active Problem List   Diagnosis Date  Noted  . Morbid obesity 08/13/2013  . Postoperative anemia due to acute blood loss 08/13/2013  . S/P right TKA 08/11/2013  . Preop cardiovascular exam 08/14/2012  . Chest pain 08/14/2012  . Suture granuloma 02/09/2012  . DYSPNEA ON EXERTION 10/04/2009  . DIABETES MELLITUS, TYPE II 04/15/2007  . HYPERLIPIDEMIA 04/15/2007  . OBESITY 04/15/2007  . ANEMIA, CHRONIC 04/15/2007  . ANXIETY 04/15/2007  . DEPRESSION 04/15/2007  . HYPERTENSION 04/15/2007  . GERD 04/15/2007  . DEGENERATIVE JOINT DISEASE 04/15/2007  . HELICOBACTER PYLORI INFECTION, HX OF 04/15/2007  . COLONIC POLYPS 10/04/2006  . DIVERTICULOSIS, COLON 10/04/2006  . REFLUX ESOPHAGITIS Dec 31, 202003  . GASTRITIS, ACUTE Dec 31, 202003    Magen Suriano,CYNDI 03/02/2014, 3:49 PM  Magda Kiel, PT 03/02/2014   [facility] [address]  [place], [place], [address] Phone: [phone]   Fax:  [phone]

## 2014-03-02 NOTE — Patient Instructions (Signed)
Educated patient regarding need to plan to continue some form of exercise after d/c from therapy.  Discussed with pt and spouse option of Silver Sneakers or home based exercise.  Feel safest to do HEP and walking program in the home.  Educated pt to bring her current HEP next session to review and update.

## 2014-03-04 DIAGNOSIS — H35342 Macular cyst, hole, or pseudohole, left eye: Secondary | ICD-10-CM | POA: Diagnosis not present

## 2014-03-05 ENCOUNTER — Ambulatory Visit: Payer: Commercial Managed Care - HMO | Admitting: Rehabilitation

## 2014-03-05 ENCOUNTER — Encounter: Payer: Self-pay | Admitting: Rehabilitation

## 2014-03-05 VITALS — BP 154/78 | HR 94

## 2014-03-05 DIAGNOSIS — M6281 Muscle weakness (generalized): Secondary | ICD-10-CM

## 2014-03-05 DIAGNOSIS — M25661 Stiffness of right knee, not elsewhere classified: Secondary | ICD-10-CM

## 2014-03-05 DIAGNOSIS — R2689 Other abnormalities of gait and mobility: Secondary | ICD-10-CM

## 2014-03-05 DIAGNOSIS — R0600 Dyspnea, unspecified: Secondary | ICD-10-CM | POA: Diagnosis not present

## 2014-03-05 DIAGNOSIS — M25561 Pain in right knee: Secondary | ICD-10-CM | POA: Diagnosis not present

## 2014-03-05 DIAGNOSIS — I10 Essential (primary) hypertension: Secondary | ICD-10-CM | POA: Diagnosis not present

## 2014-03-05 DIAGNOSIS — E119 Type 2 diabetes mellitus without complications: Secondary | ICD-10-CM | POA: Diagnosis not present

## 2014-03-05 DIAGNOSIS — M1990 Unspecified osteoarthritis, unspecified site: Secondary | ICD-10-CM | POA: Diagnosis not present

## 2014-03-05 DIAGNOSIS — Z96651 Presence of right artificial knee joint: Secondary | ICD-10-CM | POA: Diagnosis not present

## 2014-03-05 NOTE — Therapy (Addendum)
[facility] [address]  [place], [place], [address] Phone: [phone]   Fax:  [phone]  Physical Therapy Treatment  Patient Details  Name: Lauren Crosby MRN: [id number] Date of Birth: 13-Dec-1931 Referring Provider:  Mauri Pole, MD  Encounter Date: 03/05/2014      PT End of Session - 03/05/14 1520    Visit Number 18   Date for PT Re-Evaluation 03/06/14   PT Start Time 1430   PT Stop Time 1528   PT Time Calculation (min) 58 min   Activity Tolerance Patient tolerated treatment well   Behavior During Therapy [facility] for tasks assessed/performed      Past Medical History  Diagnosis Date  . Colon polyp     adenomatous  . Hypertension   . Hyperlipemia   . Adrenal adenoma   . Gout   . Hernia   . Thalassemia minor   . Nephrolithiasis   . Open wound of abdominal wall, anterior, without mention of complication   . Cellulitis and abscess of trunk   . Diverticulitis   . Shortness of breath     PT IS NOT ABLE TO BE ACTIVE BECAUSE OF KNEE PROBLEM; HAS ALWAYS HAD PROBLEM WITH BREATHING HEAVY - DOES GET SOB WITH EXERTION.  Marland Kitchen Pneumonia     IN THE PAST  . Diabetes     type 2  PT IS ON ORAL MEDICATION AND INSULIN  . 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 Retinopathy due to secondary diabetes mellitus   . GERD (gastroesophageal reflux disease)     NO MEDS FOR GERD  . Arthritis     OA AND PAIN BOTH KNEES BUT RIGHT KNEE PAIN WORSE; SOME LOWER BACK PAIN  . Hepatitis     YELLOW JAUNDICE WHEN A CHILD    Past Surgical History  Procedure Laterality Date  . Cholecystectomy    . Sigmoid resection / rectopexy    . Ventral hernia repair  2007  . Total abdominal hysterectomy    . Rotator cuff repair    . Cardiac catheterization  2008    conclusion: smooth and normal coronary arteries, normal left ventricular systolic function  . Colonoscopy  10/04/06  . Diagnostic mammogram  03/17/2011  .  Tonsillectomy    . Appendectomy      1993 PT HAD APPENDECTOMY AND ABDOMINAL SURGERY FRO DIVERTICULITS  . Total knee arthroplasty Right 08/11/2013    Procedure: RIGHT TOTAL KNEE ARTHROPLASTY;  Surgeon: Mauri Pole, MD;  Location: [facility];  Service: Orthopedics;  Laterality: Right;    BP 154/78 mmHg  Pulse 94  SpO2 96%  Visit Diagnosis:  Pain in right knee  Stiffness of right knee  Impaired gait and mobility  Muscle weakness      Subjective Assessment - 03/05/14 1437    Symptoms Reports Rt knee ranges between 2-7/10 with the longer she walks. Says she is independent with ADL's/chores but has modified some of them so she can stay sitting. Is using her walking at home and has been preparing a walking program for her house.    Limitations Walking;Standing;Sitting   How long can you stand comfortably? 5 minutes.    How long can you walk comfortably? Patient is unsure but states it is not very long.    Currently in Pain? No/denies  Reports Rt knee feeling ok but notes Lt knee pain at a 4/10.  [facility] PT Assessment - 03/05/14 0001    ROM / Strength   AROM / PROM / Strength Strength   AROM   AROM Assessment Site Knee   Right/Left Knee Right   Right Knee Extension 10   Right Knee Flexion 112   Strength   Strength Assessment Site Hip;Knee   Right/Left Hip Right   Right Hip Flexion 4-/5  Seated   Right/Left Knee Right   Right Knee Flexion 4+/5   Right Knee Extension 4/5                  [facility] Adult PT Treatment/Exercise - 03/05/14 0001    Knee/Hip Exercises: Aerobic   Stationary Bike Nustep level 4 x 8 minutes UE/LE    Knee/Hip Exercises: Standing   Heel Raises 1 set;10 reps   Hip ADduction AROM;Both;2 sets;5 sets   Forward Step Up 1 set;10 reps;Right   Other Standing Knee Exercises Marching, bil 2x5 each with RW support   Knee/Hip Exercises: Seated   Other Seated Knee Exercises hamstring curls red TB, x10   Electrical Stimulation   Electrical  Stimulation Location Rt Market researcher LAQ with 4# ankle weight   Electrical Stimulation Parameters Russian co-contract, 10" on/10" off x 12 minutes   Electrical Stimulation Goals Strength                PT Education - 03/05/14 1450    Education provided Yes   Education Details Walking plan for patient in house. Discussed how long and proper progression. New/final HEP including standing hip abduction, marching, heelraises, step ups; seated LAQ, hamstring curl with theraband, heelslides, SLR.    Person(s) Educated Patient   Methods Explanation          PT Short Term Goals - 03/05/14 1522    PT SHORT TERM GOAL #1   Title Verbalize 5/5 fall precautions in order to reduce the risk for falls. 12/02/13   Time 4   Period Weeks   Status Partially Met           PT Long Term Goals - 03/05/14 1520    PT LONG TERM GOAL #1   Title be independent with HEP. 03/06/14   Time 4   Period Weeks   Status Achieved   PT LONG TERM GOAL #2   Title improve AROM of the Rt knee to 5-115 degrees in order to improve joint mobility during ambulation and transfer. 03/06/14   Time 4   Period Weeks   Status Not Met   PT LONG TERM GOAL #3   Title report pain decrease of the Rt knee to 0-4/10 on VAS. 03/06/14   Time 4   Period Weeks   Status Not Met   PT LONG TERM GOAL #4   Title increase R hip/knee strength to >4/5 MMT grade in order to improve ability to complete functional activities. 03/06/14   Time 4   Period Weeks   Status Partially Met   PT LONG TERM GOAL #5   Title improve TUG time to <30 seconds in order to reduce the risk for falls and improve household ambulation. 03/06/14   Time 4   Period Weeks   Status Achieved   PT LONG TERM GOAL #6   Title ambulate >300 feet with the use of a RW with distant supervision with improved heel to toe sequence demonstrated in order to improve household ambulation and progress towards community ambulation.  03/06/14   Time 4  Period Weeks   Status Not Met               Problem List Patient Active Problem List   Diagnosis Date Noted  . Morbid obesity 08/13/2013  . Postoperative anemia due to acute blood loss 08/13/2013  . S/P right TKA 08/11/2013  . Preop cardiovascular exam 08/14/2012  . Chest pain 08/14/2012  . Suture granuloma 02/09/2012  . DYSPNEA ON EXERTION 10/04/2009  . DIABETES MELLITUS, TYPE II 04/15/2007  . HYPERLIPIDEMIA 04/15/2007  . OBESITY 04/15/2007  . ANEMIA, CHRONIC 04/15/2007  . ANXIETY 04/15/2007  . DEPRESSION 04/15/2007  . HYPERTENSION 04/15/2007  . GERD 04/15/2007  . DEGENERATIVE JOINT DISEASE 04/15/2007  . HELICOBACTER PYLORI INFECTION, HX OF 04/15/2007  . COLONIC POLYPS 10/04/2006  . DIVERTICULOSIS, COLON 10/04/2006  . REFLUX ESOPHAGITIS 07-10-2001  . GASTRITIS, ACUTE 07-10-2001    Barbette Hair, PTA 03/05/2014, 3:33 PM  [facility] [address]  [place], [place], [address] Phone: [phone]   Fax:  [phone]    PHYSICAL THERAPY DISCHARGE SUMMARY  Visits from Start of Care: 18  Current functional level related to goals / functional outcomes: See goals per above. Patient ready for Discharge   Remaining deficits: Need for continued strengthening and endurance post therapy   Education / Equipment: HEP Plan: Patient agrees to discharge.  Patient goals were partially met. Patient is being discharged due to being pleased with the current functional level.  ?????    Shan Levans, DPT, CMP

## 2014-03-18 DIAGNOSIS — Z96651 Presence of right artificial knee joint: Secondary | ICD-10-CM | POA: Diagnosis not present

## 2014-03-18 DIAGNOSIS — Z471 Aftercare following joint replacement surgery: Secondary | ICD-10-CM | POA: Diagnosis not present

## 2014-03-24 DIAGNOSIS — H35371 Puckering of macula, right eye: Secondary | ICD-10-CM | POA: Diagnosis not present

## 2014-03-24 DIAGNOSIS — H3531 Nonexudative age-related macular degeneration: Secondary | ICD-10-CM | POA: Diagnosis not present

## 2014-05-20 DIAGNOSIS — E1129 Type 2 diabetes mellitus with other diabetic kidney complication: Secondary | ICD-10-CM | POA: Diagnosis not present

## 2014-05-20 DIAGNOSIS — E785 Hyperlipidemia, unspecified: Secondary | ICD-10-CM | POA: Diagnosis not present

## 2014-05-20 DIAGNOSIS — D649 Anemia, unspecified: Secondary | ICD-10-CM | POA: Diagnosis not present

## 2014-05-20 DIAGNOSIS — E049 Nontoxic goiter, unspecified: Secondary | ICD-10-CM | POA: Diagnosis not present

## 2014-05-20 DIAGNOSIS — D126 Benign neoplasm of colon, unspecified: Secondary | ICD-10-CM | POA: Diagnosis not present

## 2014-05-20 DIAGNOSIS — N08 Glomerular disorders in diseases classified elsewhere: Secondary | ICD-10-CM | POA: Diagnosis not present

## 2014-05-20 DIAGNOSIS — I10 Essential (primary) hypertension: Secondary | ICD-10-CM | POA: Diagnosis not present

## 2014-05-20 DIAGNOSIS — M109 Gout, unspecified: Secondary | ICD-10-CM | POA: Diagnosis not present

## 2014-08-25 DIAGNOSIS — Z471 Aftercare following joint replacement surgery: Secondary | ICD-10-CM | POA: Diagnosis not present

## 2014-08-25 DIAGNOSIS — Z96651 Presence of right artificial knee joint: Secondary | ICD-10-CM | POA: Diagnosis not present

## 2014-09-10 DIAGNOSIS — N281 Cyst of kidney, acquired: Secondary | ICD-10-CM | POA: Diagnosis not present

## 2014-09-10 DIAGNOSIS — D126 Benign neoplasm of colon, unspecified: Secondary | ICD-10-CM | POA: Diagnosis not present

## 2014-09-10 DIAGNOSIS — E1129 Type 2 diabetes mellitus with other diabetic kidney complication: Secondary | ICD-10-CM | POA: Diagnosis not present

## 2014-09-10 DIAGNOSIS — D649 Anemia, unspecified: Secondary | ICD-10-CM | POA: Diagnosis not present

## 2014-09-10 DIAGNOSIS — E785 Hyperlipidemia, unspecified: Secondary | ICD-10-CM | POA: Diagnosis not present

## 2014-09-10 DIAGNOSIS — N08 Glomerular disorders in diseases classified elsewhere: Secondary | ICD-10-CM | POA: Diagnosis not present

## 2014-09-10 DIAGNOSIS — Z96659 Presence of unspecified artificial knee joint: Secondary | ICD-10-CM | POA: Diagnosis not present

## 2014-09-10 DIAGNOSIS — E049 Nontoxic goiter, unspecified: Secondary | ICD-10-CM | POA: Diagnosis not present

## 2014-09-11 ENCOUNTER — Encounter: Payer: Self-pay | Admitting: Internal Medicine

## 2014-09-28 ENCOUNTER — Encounter: Payer: Self-pay | Admitting: Obstetrics and Gynecology

## 2014-09-28 ENCOUNTER — Ambulatory Visit (INDEPENDENT_AMBULATORY_CARE_PROVIDER_SITE_OTHER): Payer: Commercial Managed Care - HMO | Admitting: Obstetrics and Gynecology

## 2014-09-28 VITALS — BP 158/64 | HR 80 | Resp 20 | Ht 63.5 in | Wt 246.0 lb

## 2014-09-28 DIAGNOSIS — R319 Hematuria, unspecified: Secondary | ICD-10-CM | POA: Diagnosis not present

## 2014-09-28 LAB — POCT URINALYSIS DIPSTICK
Bilirubin, UA: NEGATIVE
Glucose, UA: NEGATIVE
KETONES UA: NEGATIVE
Leukocytes, UA: NEGATIVE
Nitrite, UA: NEGATIVE
PROTEIN UA: NEGATIVE
Urobilinogen, UA: NEGATIVE
pH, UA: 7

## 2014-09-28 NOTE — Progress Notes (Signed)
Patient ID: Lauren Crosby, female   DOB: 01/30/1931, 81 y.o.   MRN: [id number] GYNECOLOGY  VISIT   HPI: 81 y.o.   Married  Caucasian  female   [phone] with No LMP recorded. Patient is postmenopausal.   here for c/o hematuria. She denies seeing any blood on the toilet paper when she cleans. She only sees it in her urine. She denies pain of discomfort when urinating. She has been having blood in her urine for the last 2 weeks, sometimes it's all bloody other times just streaks. No blood when she wipes, no dysuria, no frequency, no urgency. Voiding normal amounts. No recent leakage of urine. No blood on her underwear, not sexually active. No fevers, no flank pain. Some lower back pain when she first wakes up.  She has multiple medical problems, including diabetes, under control. She had an abdominal hysterectomy, unsure if she has her cervix. States she still has her ovaries.  GYNECOLOGIC HISTORY: No LMP recorded. Patient is postmenopausal. Contraception:Hysterectomy  Menopausal hormone therapy: N/A        OB History    Gravida Para Term Preterm AB TAB SAB Ectopic Multiple Living   7 6 6  1  1   6          Patient Active Problem List   Diagnosis Date Noted  . Morbid obesity 08/13/2013  . Postoperative anemia due to acute blood loss 08/13/2013  . S/P right TKA 08/11/2013  . Preop cardiovascular exam 08/14/2012  . Chest pain 08/14/2012  . Suture granuloma 02/09/2012  . DYSPNEA ON EXERTION 10/04/2009  . DIABETES MELLITUS, TYPE II 04/15/2007  . HYPERLIPIDEMIA 04/15/2007  . OBESITY 04/15/2007  . ANEMIA, CHRONIC 04/15/2007  . ANXIETY 04/15/2007  . DEPRESSION 04/15/2007  . HYPERTENSION 04/15/2007  . GERD 04/15/2007  . DEGENERATIVE JOINT DISEASE 04/15/2007  . HELICOBACTER PYLORI INFECTION, HX OF 04/15/2007  . COLONIC POLYPS 10/04/2006  . DIVERTICULOSIS, COLON 10/04/2006  . REFLUX ESOPHAGITIS 2020-07-3001  . GASTRITIS, ACUTE 2020-07-3001    Past Medical History  Diagnosis Date  . Colon  polyp     adenomatous  . Hypertension   . Hyperlipemia   . Adrenal adenoma   . Gout   . Hernia   . Thalassemia minor   . Nephrolithiasis   . Open wound of abdominal wall, anterior, without mention of complication   . Cellulitis and abscess of trunk   . Diverticulitis   . Shortness of breath     PT IS NOT ABLE TO BE ACTIVE BECAUSE OF KNEE PROBLEM; HAS ALWAYS HAD PROBLEM WITH BREATHING HEAVY - DOES GET SOB WITH EXERTION.  Marland Kitchen Pneumonia     IN THE PAST  . Diabetes     type 2  PT IS ON ORAL MEDICATION AND INSULIN  . 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 Retinopathy due to secondary diabetes mellitus   . GERD (gastroesophageal reflux disease)     NO MEDS FOR GERD  . Arthritis     OA AND PAIN BOTH KNEES BUT RIGHT KNEE PAIN WORSE; SOME LOWER BACK PAIN  . Hepatitis     YELLOW JAUNDICE WHEN A CHILD    Past Surgical History  Procedure Laterality Date  . Cholecystectomy    . Sigmoid resection / rectopexy    . Ventral hernia repair  2007  . Total abdominal hysterectomy    . Rotator cuff repair    . Cardiac catheterization  2008    conclusion: smooth and normal coronary arteries, normal  left ventricular systolic function  . Colonoscopy  10/04/06  . Diagnostic mammogram  03/17/2011  . Tonsillectomy    . Appendectomy      1993 PT HAD APPENDECTOMY AND ABDOMINAL SURGERY FRO DIVERTICULITS  . Total knee arthroplasty Right 08/11/2013    Procedure: RIGHT TOTAL KNEE ARTHROPLASTY;  Surgeon: Mauri Pole, MD;  Location: [facility];  Service: Orthopedics;  Laterality: Right;    Current Outpatient Prescriptions  Medication Sig Dispense Refill  . ferrous sulfate 325 (65 FE) MG tablet Take 1 tablet (325 mg total) by mouth 3 (three) times daily after meals.  3  . insulin NPH-regular Human (NOVOLIN 70/30) (70-30) 100 UNIT/ML injection Inject 50-65 Units into the skin 2 (two) times daily with a meal.    . losartan (COZAAR) 100 MG tablet Take 100 mg by mouth every evening.     . metFORMIN  (GLUCOPHAGE) 1000 MG tablet Take 1,000 mg by mouth 2 (two) times daily with a meal.    . Multiple Vitamins-Minerals (MULTIVITAMIN PO) Take 1 tablet by mouth daily.    . Multiple Vitamins-Minerals (PRESERVISION AREDS 2 PO) Take 1 tablet by mouth 2 (two) times daily.    . simvastatin (ZOCOR) 80 MG tablet Take 80 mg by mouth at bedtime.     No current facility-administered medications for this visit.     ALLERGIES: Penicillins  Family History  Problem Relation Age of Onset  . Diabetes    . Hypertension    . Diabetes Father   . Hypertension Father   . Kidney disease Father   . Liver disease Mother   . Rheum arthritis Sister   . Diverticulitis Sister   . Hypertension Sister     Social History   Social History  . Marital Status: Married    Spouse Name: N/A  . Number of Children: 6  . Years of Education: N/A   Occupational History  . Not on file.   Social History Main Topics  . Smoking status: Never Smoker   . Smokeless tobacco: Never Used  . Alcohol Use: No  . Drug Use: No  . Sexual Activity:    Partners: Male    Birth Control/ Protection: Post-menopausal   Other Topics Concern  . Not on file   Social History Narrative    Review of Systems  Genitourinary: Positive for hematuria. Negative for dysuria.  All other systems reviewed and are negative.   PHYSICAL EXAMINATION:    BP 158/64 mmHg  Pulse 80  Resp 20  Ht 5' 3.5" (1.613 m)  Wt 246 lb (111.585 kg)  BMI 42.89 kg/m2    General appearance: alert, cooperative and appears stated age, visible short of breath, not a change for her Neck: no adenopathy, supple, symmetrical, trachea midline and thyroid normal to inspection and palpation Abdomen: soft, non-tender; bowel sounds normal; no masses,  no organomegaly CVA region not tender  Pelvic: External genitalia:  Multiple epidermal cysts, one slightly excoriated, no lesions              Urethra:  normal appearing urethra with no masses, tenderness or lesions               Bartholins and Skenes: normal                 Vagina: atrophic vaginal mucosa, grade 1-2 rectocele, no vaginal lesions              Cervix: absent  Bimanual Exam:  Uterus:  uterus absent              Adnexa: no mass, fullness, tenderness              Rectovaginal: Yes.  .  Confirms.              Anus:  normal sphincter tone, no lesions  St cath ua performed, no gross blood in her urine  Chaperone was present for exam.  ASSESSMENT Hematuria, work up needed    PLAN Urine for ua, c&s Will likely need referal to urology    An After Visit Summary was printed and given to the patient.

## 2014-09-29 LAB — URINE CULTURE
Colony Count: NO GROWTH
Organism ID, Bacteria: NO GROWTH

## 2014-09-29 LAB — URINALYSIS, MICROSCOPIC ONLY
Bacteria, UA: NONE SEEN [HPF]
CASTS: NONE SEEN [LPF]
CRYSTALS: NONE SEEN [HPF]
Squamous Epithelial / LPF: NONE SEEN [HPF] (ref <=5)
WBC, UA: NONE SEEN WBC/HPF (ref <=5)
Yeast: NONE SEEN [HPF]

## 2014-10-14 DIAGNOSIS — H5210 Myopia, unspecified eye: Secondary | ICD-10-CM | POA: Diagnosis not present

## 2014-10-14 DIAGNOSIS — Z0100 Encounter for examination of eyes and vision without abnormal findings: Secondary | ICD-10-CM | POA: Diagnosis not present

## 2014-11-05 ENCOUNTER — Ambulatory Visit (INDEPENDENT_AMBULATORY_CARE_PROVIDER_SITE_OTHER): Payer: Commercial Managed Care - HMO | Admitting: Internal Medicine

## 2014-11-05 ENCOUNTER — Encounter: Payer: Self-pay | Admitting: Internal Medicine

## 2014-11-05 VITALS — BP 164/72 | HR 88

## 2014-11-05 DIAGNOSIS — Z86010 Personal history of colonic polyps: Secondary | ICD-10-CM

## 2014-11-05 DIAGNOSIS — K5730 Diverticulosis of large intestine without perforation or abscess without bleeding: Secondary | ICD-10-CM

## 2014-11-05 DIAGNOSIS — R319 Hematuria, unspecified: Secondary | ICD-10-CM

## 2014-11-05 DIAGNOSIS — D509 Iron deficiency anemia, unspecified: Secondary | ICD-10-CM

## 2014-11-05 NOTE — Progress Notes (Signed)
HISTORY OF PRESENT ILLNESS:  Lauren Crosby is a 81 y.o. female with multiple significant medical problems as listed below. She is sent today by her primary care provider, Dr. Reynold Crosby, regarding the need for surveillance colonoscopy. The patient underwent index colonoscopy in 2001 and was found to have a diminutive adenoma. Subsequent examinations and 2005, and 2008 (severe pandiverticulosis, left colon anastomosis, diminutive polyps without pathology, and fair but adequate preparation). Repeat colonoscopy with different bowel prep in 5 years was recommended patient was medically fit. She is accompanied today by her husband. She does have chronic microcytic anemia her entire life (at least 8 years through the electronic medical record) secondary to thalassemia minor. Recently complained of vaginal bleeding or blood per urine which was evaluated by her gynecologist. Negative workup I am told. No recurrent problems recently. Patient is morbidly obese. For the most part wheelchair-bound due to her immobility. Confirmed by her husband who is present today. The patient's GI review of systems is quite unremarkable. She denies abdominal pain, change in bowel habits, or rectal bleeding. Review of outside blood work shows chronic stable anemia with hemoglobin 10.9 and MCV 68.3. Iron studies and B12 levels were normal. No family history of colon cancer.  REVIEW OF SYSTEMS:  All non-GI ROS negative except for hearing problems, hematuria, ankle swelling  Past Medical History  Diagnosis Date  . Colon polyp     adenomatous  . Hypertension   . Hyperlipemia   . Adrenal adenoma   . Gout   . Hernia   . Thalassemia minor   . Nephrolithiasis   . Open wound of abdominal wall, anterior, without mention of complication   . Cellulitis and abscess of trunk   . Diverticulitis   . Shortness of breath     PT IS NOT ABLE TO BE ACTIVE BECAUSE OF KNEE PROBLEM; HAS ALWAYS HAD PROBLEM WITH BREATHING HEAVY - DOES GET  SOB WITH EXERTION.  Marland Kitchen Pneumonia     IN THE PAST  . Diabetes ([place])     type 2  PT IS ON ORAL MEDICATION AND INSULIN  . 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 Retinopathy due to secondary diabetes mellitus ([place])   . GERD (gastroesophageal reflux disease)     NO MEDS FOR GERD  . Arthritis     OA AND PAIN BOTH KNEES BUT RIGHT KNEE PAIN WORSE; SOME LOWER BACK PAIN  . Hepatitis     YELLOW JAUNDICE WHEN A CHILD  . Goiter   . Anemia   . Obesity     Past Surgical History  Procedure Laterality Date  . Cholecystectomy    . Sigmoid resection / rectopexy    . Ventral hernia repair  2007  . Total abdominal hysterectomy    . Rotator cuff repair Left   . Cardiac catheterization  2008    conclusion: smooth and normal coronary arteries, normal left ventricular systolic function  . Colonoscopy  10/04/06  . Diagnostic mammogram  03/17/2011  . Tonsillectomy    . Appendectomy      1993 PT HAD APPENDECTOMY AND ABDOMINAL SURGERY FRO DIVERTICULITS  . Total knee arthroplasty Right 08/11/2013    Procedure: RIGHT TOTAL KNEE ARTHROPLASTY;  Surgeon: Mauri Pole, MD;  Location: [facility];  Service: Orthopedics;  Laterality: Right;    Social History Lauren Crosby  reports that she has never smoked. She has never used smokeless tobacco. She reports that she does not drink alcohol or use illicit drugs.  family history includes Diabetes in her father; Diverticulitis in her sister; Hypertension in her father, sister, and another family member; Kidney disease in her father; Liver disease in her mother; Rheum arthritis in her sister. There is no history of Colon cancer, Colon polyps, Esophageal cancer, Pancreatic cancer, or Stomach cancer.  Allergies  Allergen Reactions  . Penicillins Other (See Comments)    SHAKY AND DIZZY       PHYSICAL EXAMINATION: Vital signs: BP 164/72 mmHg  Pulse 88  Ht   Wt   Constitutional: Obese, unhealthy appearing, in wheelchair and unable to get on  examining table, no acute distress Psychiatric: Pleasant, alert and oriented x3, cooperative Eyes: extraocular movements intact, anicteric, conjunctiva pink Mouth: oral pharynx moist, no lesions Neck: Thick but supple no lymphadenopathy Cardiovascular: heart regular rate and rhythm, no murmur Lungs: clear to auscultation bilaterally Abdomen: soft, obese, nontender, nondistended, no obvious ascites, no peritoneal signs, normal bowel sounds, no organomegaly Rectal: Omitted Extremities: no clubbing or cyanosis. Trace bilateral lower extremity edema  Skin: no lesions on visible extremities Neuro: No focal deficits.   ASSESSMENT:  #1. Personal history of adenomatous colon polyps. Prior colonoscopies 2001, 2005, and 2008 as described. Patient currently with advanced age, limited functional status, morbid obesity, and no relevant or abnormal GI complaints, signs, her laboratories #2. Chronic microcytic anemia secondary to thalassemia minor #3. Recent problems with hematuria. Management per PCP . Advised to notify her PCP should the problem recur #4. Severe diverticulosis  PLAN:  #1. I discussed with the patient and her husband the risk benefit profile of proceeding with opical colonoscopy at this time. I think this would be extremely difficult for her (physically and in terms of preparation) and she would not tolerate an adverse procedure related event well. They understand the risks and benefits of proceeding and not proceeding (missed lesion). To this end, I have decided to forego routine surveillance colonoscopy in this patient for medical reasons. If there was a compelling medical reason at some point to consider reevaluating the colon, this can be readdressed. They understand and are appreciative.  30 minutes was spent face-to-face with the patient in her evaluation. The overwhelming majority of the time was spent counseling her and her husband regarding the above issues with regards to  surveillance colonoscopy.

## 2014-11-05 NOTE — Patient Instructions (Signed)
Please follow up with Dr. Perry in one year 

## 2015-02-05 DIAGNOSIS — R310 Gross hematuria: Secondary | ICD-10-CM | POA: Diagnosis not present

## 2015-02-09 DIAGNOSIS — D6489 Other specified anemias: Secondary | ICD-10-CM | POA: Diagnosis not present

## 2015-02-09 DIAGNOSIS — I10 Essential (primary) hypertension: Secondary | ICD-10-CM | POA: Diagnosis not present

## 2015-02-09 DIAGNOSIS — D126 Benign neoplasm of colon, unspecified: Secondary | ICD-10-CM | POA: Diagnosis not present

## 2015-02-09 DIAGNOSIS — M109 Gout, unspecified: Secondary | ICD-10-CM | POA: Diagnosis not present

## 2015-02-09 DIAGNOSIS — N200 Calculus of kidney: Secondary | ICD-10-CM | POA: Diagnosis not present

## 2015-02-09 DIAGNOSIS — E048 Other specified nontoxic goiter: Secondary | ICD-10-CM | POA: Diagnosis not present

## 2015-02-09 DIAGNOSIS — E784 Other hyperlipidemia: Secondary | ICD-10-CM | POA: Diagnosis not present

## 2015-02-09 DIAGNOSIS — E1129 Type 2 diabetes mellitus with other diabetic kidney complication: Secondary | ICD-10-CM | POA: Diagnosis not present

## 2015-02-09 DIAGNOSIS — N08 Glomerular disorders in diseases classified elsewhere: Secondary | ICD-10-CM | POA: Diagnosis not present

## 2015-02-10 ENCOUNTER — Telehealth: Payer: Self-pay | Admitting: Obstetrics and Gynecology

## 2015-02-10 DIAGNOSIS — N281 Cyst of kidney, acquired: Secondary | ICD-10-CM | POA: Diagnosis not present

## 2015-02-10 DIAGNOSIS — R310 Gross hematuria: Secondary | ICD-10-CM | POA: Diagnosis not present

## 2015-02-10 NOTE — Telephone Encounter (Signed)
Records received from Dr Festus Aloe of [facility]. She is being worked up for possible hematuria.

## 2015-03-23 DIAGNOSIS — E113293 Type 2 diabetes mellitus with mild nonproliferative diabetic retinopathy without macular edema, bilateral: Secondary | ICD-10-CM | POA: Diagnosis not present

## 2015-03-23 DIAGNOSIS — H35373 Puckering of macula, bilateral: Secondary | ICD-10-CM | POA: Diagnosis not present

## 2015-03-23 DIAGNOSIS — D3131 Benign neoplasm of right choroid: Secondary | ICD-10-CM | POA: Diagnosis not present

## 2015-03-29 DIAGNOSIS — R310 Gross hematuria: Secondary | ICD-10-CM | POA: Diagnosis not present

## 2015-04-10 ENCOUNTER — Telehealth: Payer: Self-pay | Admitting: Obstetrics and Gynecology

## 2015-04-10 NOTE — Telephone Encounter (Signed)
Records reviewed, negative w/u with urology.

## 2015-06-21 DIAGNOSIS — D6489 Other specified anemias: Secondary | ICD-10-CM | POA: Diagnosis not present

## 2015-06-21 DIAGNOSIS — N200 Calculus of kidney: Secondary | ICD-10-CM | POA: Diagnosis not present

## 2015-06-21 DIAGNOSIS — Z96659 Presence of unspecified artificial knee joint: Secondary | ICD-10-CM | POA: Diagnosis not present

## 2015-06-21 DIAGNOSIS — N08 Glomerular disorders in diseases classified elsewhere: Secondary | ICD-10-CM | POA: Diagnosis not present

## 2015-06-21 DIAGNOSIS — E784 Other hyperlipidemia: Secondary | ICD-10-CM | POA: Diagnosis not present

## 2015-06-21 DIAGNOSIS — E048 Other specified nontoxic goiter: Secondary | ICD-10-CM | POA: Diagnosis not present

## 2015-06-21 DIAGNOSIS — E1129 Type 2 diabetes mellitus with other diabetic kidney complication: Secondary | ICD-10-CM | POA: Diagnosis not present

## 2015-06-21 DIAGNOSIS — Z0000 Encounter for general adult medical examination without abnormal findings: Secondary | ICD-10-CM | POA: Diagnosis not present

## 2015-06-21 DIAGNOSIS — I10 Essential (primary) hypertension: Secondary | ICD-10-CM | POA: Diagnosis not present

## 2015-06-21 DIAGNOSIS — M109 Gout, unspecified: Secondary | ICD-10-CM | POA: Diagnosis not present

## 2015-09-06 DIAGNOSIS — L03116 Cellulitis of left lower limb: Secondary | ICD-10-CM | POA: Diagnosis not present

## 2015-09-06 DIAGNOSIS — R600 Localized edema: Secondary | ICD-10-CM | POA: Diagnosis not present

## 2015-09-06 DIAGNOSIS — E1129 Type 2 diabetes mellitus with other diabetic kidney complication: Secondary | ICD-10-CM | POA: Diagnosis not present

## 2015-09-06 DIAGNOSIS — D649 Anemia, unspecified: Secondary | ICD-10-CM | POA: Diagnosis not present

## 2015-09-14 DIAGNOSIS — I10 Essential (primary) hypertension: Secondary | ICD-10-CM | POA: Diagnosis not present

## 2015-09-14 DIAGNOSIS — R600 Localized edema: Secondary | ICD-10-CM | POA: Diagnosis not present

## 2015-09-14 DIAGNOSIS — Z6841 Body Mass Index (BMI) 40.0 and over, adult: Secondary | ICD-10-CM | POA: Diagnosis not present

## 2015-09-14 DIAGNOSIS — L03116 Cellulitis of left lower limb: Secondary | ICD-10-CM | POA: Diagnosis not present

## 2015-10-27 DIAGNOSIS — N200 Calculus of kidney: Secondary | ICD-10-CM | POA: Diagnosis not present

## 2015-10-27 DIAGNOSIS — Z96659 Presence of unspecified artificial knee joint: Secondary | ICD-10-CM | POA: Diagnosis not present

## 2015-10-27 DIAGNOSIS — E1129 Type 2 diabetes mellitus with other diabetic kidney complication: Secondary | ICD-10-CM | POA: Diagnosis not present

## 2015-10-27 DIAGNOSIS — R600 Localized edema: Secondary | ICD-10-CM | POA: Diagnosis not present

## 2015-10-27 DIAGNOSIS — N08 Glomerular disorders in diseases classified elsewhere: Secondary | ICD-10-CM | POA: Diagnosis not present

## 2015-10-27 DIAGNOSIS — D6489 Other specified anemias: Secondary | ICD-10-CM | POA: Diagnosis not present

## 2015-10-27 DIAGNOSIS — E784 Other hyperlipidemia: Secondary | ICD-10-CM | POA: Diagnosis not present

## 2015-10-27 DIAGNOSIS — I10 Essential (primary) hypertension: Secondary | ICD-10-CM | POA: Diagnosis not present

## 2015-10-27 DIAGNOSIS — E048 Other specified nontoxic goiter: Secondary | ICD-10-CM | POA: Diagnosis not present

## 2015-11-16 ENCOUNTER — Ambulatory Visit: Payer: Commercial Managed Care - HMO | Admitting: Podiatry

## 2016-01-31 DIAGNOSIS — R609 Edema, unspecified: Secondary | ICD-10-CM | POA: Diagnosis not present

## 2016-01-31 DIAGNOSIS — R2681 Unsteadiness on feet: Secondary | ICD-10-CM | POA: Diagnosis not present

## 2016-01-31 DIAGNOSIS — E1129 Type 2 diabetes mellitus with other diabetic kidney complication: Secondary | ICD-10-CM | POA: Diagnosis not present

## 2016-01-31 DIAGNOSIS — I872 Venous insufficiency (chronic) (peripheral): Secondary | ICD-10-CM | POA: Diagnosis not present

## 2016-02-02 DIAGNOSIS — R600 Localized edema: Secondary | ICD-10-CM | POA: Diagnosis not present

## 2016-02-02 DIAGNOSIS — M109 Gout, unspecified: Secondary | ICD-10-CM | POA: Diagnosis not present

## 2016-02-02 DIAGNOSIS — E11319 Type 2 diabetes mellitus with unspecified diabetic retinopathy without macular edema: Secondary | ICD-10-CM | POA: Diagnosis not present

## 2016-02-02 DIAGNOSIS — Z794 Long term (current) use of insulin: Secondary | ICD-10-CM | POA: Diagnosis not present

## 2016-02-02 DIAGNOSIS — I10 Essential (primary) hypertension: Secondary | ICD-10-CM | POA: Diagnosis not present

## 2016-02-02 DIAGNOSIS — E1121 Type 2 diabetes mellitus with diabetic nephropathy: Secondary | ICD-10-CM | POA: Diagnosis not present

## 2016-02-02 DIAGNOSIS — E785 Hyperlipidemia, unspecified: Secondary | ICD-10-CM | POA: Diagnosis not present

## 2016-02-05 DIAGNOSIS — M109 Gout, unspecified: Secondary | ICD-10-CM | POA: Diagnosis not present

## 2016-02-05 DIAGNOSIS — E1121 Type 2 diabetes mellitus with diabetic nephropathy: Secondary | ICD-10-CM | POA: Diagnosis not present

## 2016-02-05 DIAGNOSIS — R600 Localized edema: Secondary | ICD-10-CM | POA: Diagnosis not present

## 2016-02-05 DIAGNOSIS — I10 Essential (primary) hypertension: Secondary | ICD-10-CM | POA: Diagnosis not present

## 2016-02-05 DIAGNOSIS — E785 Hyperlipidemia, unspecified: Secondary | ICD-10-CM | POA: Diagnosis not present

## 2016-02-05 DIAGNOSIS — Z794 Long term (current) use of insulin: Secondary | ICD-10-CM | POA: Diagnosis not present

## 2016-02-05 DIAGNOSIS — E11319 Type 2 diabetes mellitus with unspecified diabetic retinopathy without macular edema: Secondary | ICD-10-CM | POA: Diagnosis not present

## 2016-02-08 DIAGNOSIS — E785 Hyperlipidemia, unspecified: Secondary | ICD-10-CM | POA: Diagnosis not present

## 2016-02-08 DIAGNOSIS — R600 Localized edema: Secondary | ICD-10-CM | POA: Diagnosis not present

## 2016-02-08 DIAGNOSIS — Z794 Long term (current) use of insulin: Secondary | ICD-10-CM | POA: Diagnosis not present

## 2016-02-08 DIAGNOSIS — E1121 Type 2 diabetes mellitus with diabetic nephropathy: Secondary | ICD-10-CM | POA: Diagnosis not present

## 2016-02-08 DIAGNOSIS — M109 Gout, unspecified: Secondary | ICD-10-CM | POA: Diagnosis not present

## 2016-02-08 DIAGNOSIS — E11319 Type 2 diabetes mellitus with unspecified diabetic retinopathy without macular edema: Secondary | ICD-10-CM | POA: Diagnosis not present

## 2016-02-08 DIAGNOSIS — I10 Essential (primary) hypertension: Secondary | ICD-10-CM | POA: Diagnosis not present

## 2016-02-24 DIAGNOSIS — R0600 Dyspnea, unspecified: Secondary | ICD-10-CM | POA: Diagnosis not present

## 2016-02-24 DIAGNOSIS — I10 Essential (primary) hypertension: Secondary | ICD-10-CM | POA: Diagnosis not present

## 2016-02-24 DIAGNOSIS — N200 Calculus of kidney: Secondary | ICD-10-CM | POA: Diagnosis not present

## 2016-02-24 DIAGNOSIS — E1129 Type 2 diabetes mellitus with other diabetic kidney complication: Secondary | ICD-10-CM | POA: Diagnosis not present

## 2016-02-24 DIAGNOSIS — N08 Glomerular disorders in diseases classified elsewhere: Secondary | ICD-10-CM | POA: Diagnosis not present

## 2016-02-24 DIAGNOSIS — E049 Nontoxic goiter, unspecified: Secondary | ICD-10-CM | POA: Diagnosis not present

## 2016-02-24 DIAGNOSIS — M109 Gout, unspecified: Secondary | ICD-10-CM | POA: Diagnosis not present

## 2016-02-24 DIAGNOSIS — D649 Anemia, unspecified: Secondary | ICD-10-CM | POA: Diagnosis not present

## 2016-02-24 DIAGNOSIS — R609 Edema, unspecified: Secondary | ICD-10-CM | POA: Diagnosis not present

## 2016-02-29 ENCOUNTER — Other Ambulatory Visit: Payer: Self-pay | Admitting: *Deleted

## 2016-02-29 DIAGNOSIS — R609 Edema, unspecified: Secondary | ICD-10-CM

## 2016-03-08 ENCOUNTER — Encounter: Payer: Self-pay | Admitting: Vascular Surgery

## 2016-03-08 ENCOUNTER — Ambulatory Visit (INDEPENDENT_AMBULATORY_CARE_PROVIDER_SITE_OTHER): Payer: Medicare HMO | Admitting: Vascular Surgery

## 2016-03-08 ENCOUNTER — Ambulatory Visit (HOSPITAL_COMMUNITY)
Admission: RE | Admit: 2016-03-08 | Discharge: 2016-03-08 | Disposition: A | Discharge status: Home / Self Care | Payer: Medicare HMO | Source: Ambulatory Visit | Attending: Vascular Surgery | Admitting: Vascular Surgery

## 2016-03-08 VITALS — BP 146/80 | HR 98 | Temp 97.8°F | Resp 22 | Ht 63.5 in | Wt 246.0 lb

## 2016-03-08 DIAGNOSIS — I872 Venous insufficiency (chronic) (peripheral): Secondary | ICD-10-CM

## 2016-03-08 DIAGNOSIS — I8393 Asymptomatic varicose veins of bilateral lower extremities: Secondary | ICD-10-CM | POA: Insufficient documentation

## 2016-03-08 DIAGNOSIS — R609 Edema, unspecified: Secondary | ICD-10-CM | POA: Insufficient documentation

## 2016-03-08 NOTE — Progress Notes (Signed)
Patient name: Lauren Crosby MRN: [id number] DOB: 06/26/1931 Sex: female  REASON FOR CONSULT: Chronic venous insufficiency. Referred by Dr. [name].  HPI: Lauren Crosby is a 83 y.o. female, referred by Dr. [name] with chronic edema. She has had bilateral lower extremity swelling for many months. Her swelling has gradually progressed. She is unaware of any previous history of DVT or phlebitis. She did have a total knee replacement in 2015.  Her activity is very limited and she spends a lot of time sitting in her wheelchair. She describes some aching pain and heaviness in her legs with sitting and standing.  I have reviewed the records that were sent from [facility]. The patient has had some hematuria and urology has been consult.  Past Medical History:  Diagnosis Date  . Adrenal adenoma   . Anemia   . Arthritis    OA AND PAIN BOTH KNEES BUT RIGHT KNEE PAIN WORSE; SOME LOWER BACK PAIN  . Cellulitis and abscess of trunk   . Colon polyp    adenomatous  . Diabetes ([place])    type 2  PT IS ON ORAL MEDICATION AND INSULIN  . Diverticulitis   . GERD (gastroesophageal reflux disease)    NO MEDS FOR GERD  . Goiter   . Gout   . Hepatitis    YELLOW JAUNDICE WHEN A CHILD  . Hernia   . Hyperlipemia   . Hypertension   . Nephrolithiasis   . 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 Obesity   . Open wound of abdominal wall, anterior, without mention of complication   . Pneumonia    IN THE PAST  . Retinopathy due to secondary diabetes mellitus ([place])   . Shortness of breath    PT IS NOT ABLE TO BE ACTIVE BECAUSE OF KNEE PROBLEM; HAS ALWAYS HAD PROBLEM WITH BREATHING HEAVY - DOES GET SOB WITH EXERTION.  Marland Kitchen Thalassemia minor     Family History  Problem Relation Age of Onset  . Hypertension    . Diabetes Father   . Hypertension Father   . Kidney disease Father   . Liver disease Mother   . Rheum arthritis Sister   . Diverticulitis  Sister   . Hypertension Sister   . Colon cancer Neg Hx   . Colon polyps Neg Hx   . Esophageal cancer Neg Hx   . Pancreatic cancer Neg Hx   . Stomach cancer Neg Hx     SOCIAL HISTORY: Social History   Social History  . Marital status: Married    Spouse name: N/A  . Number of children: 6  . Years of education: N/A   Occupational History  . Not on file.   Social History Main Topics  . Smoking status: Never Smoker  . Smokeless tobacco: Never Used  . Alcohol use No  . Drug use: No  . Sexual activity: Not Currently    Partners: Male    Birth control/ protection: Post-menopausal   Other Topics Concern  . Not on file   Social History Narrative  . No narrative on file    Allergies  Allergen Reactions  . Penicillins Other (See Comments)    SHAKY AND DIZZY    Current Outpatient Prescriptions  Medication Sig Dispense Refill  . allopurinol (ZYLOPRIM) 300 MG tablet Take 300 mg by mouth daily.    . insulin NPH-regular Human (NOVOLIN 70/30) (70-30) 100 UNIT/ML injection Inject 50-65 Units into the skin 2 (  two) times daily with a meal.    . losartan (COZAAR) 100 MG tablet Take 100 mg by mouth every evening.     . metFORMIN (GLUCOPHAGE) 1000 MG tablet Take 1,000 mg by mouth 2 (two) times daily with a meal.    . Multiple Vitamins-Minerals (MULTIVITAMIN PO) Take 1 tablet by mouth daily.    . Multiple Vitamins-Minerals (PRESERVISION AREDS 2 PO) Take 1 tablet by mouth 2 (two) times daily.    . Polysaccharide Iron-FA-B12 (FERREX 150 FORTE PO) Take by mouth.    . simvastatin (ZOCOR) 80 MG tablet Take 80 mg by mouth at bedtime.     No current facility-administered medications for this visit.     REVIEW OF SYSTEMS:  [X]  denotes positive finding, [ ]  denotes negative finding Cardiac  Comments:  Chest pain or chest pressure:    Shortness of breath upon exertion: X   Short of breath when lying flat:    Irregular heart rhythm:        Vascular    Pain in calf, thigh, or hip brought  on by ambulation:    Pain in feet at night that wakes you up from your sleep:     Blood clot in your veins:    Leg swelling:  X       Pulmonary    Oxygen at home:    Productive cough:     Wheezing:         Neurologic    Sudden weakness in arms or legs:     Sudden numbness in arms or legs:     Sudden onset of difficulty speaking or slurred speech:    Temporary loss of vision in one eye:     Problems with dizziness:         Gastrointestinal    Blood in stool:     Vomited blood:         Genitourinary    Burning when urinating:     Blood in urine: X       Psychiatric    Major depression:         Hematologic    Bleeding problems:    Problems with blood clotting too easily:        Skin    Rashes or ulcers:        Constitutional    Fever or chills:      PHYSICAL EXAM: Vitals:   03/08/16 1459  BP: (!) 146/80  Pulse: 98  Resp: (!) 22  Temp: 97.8 F (36.6 C)  TempSrc: Oral  SpO2: 96%  Weight: 246 lb (111.6 kg)  Height: 5' 3.5" (1.613 m)    GENERAL: The patient is a well-nourished female, in no acute distress. The vital signs are documented above. CARDIAC: There is a regular rate and rhythm.  VASCULAR: I do not detect carotid bruits. I cannot palpate pedal pulses however she does have brisk Doppler signals in both feet. She has bilateral lower extremity swelling. PULMONARY: There is good air exchange bilaterally without wheezing or rales. ABDOMEN: Soft and non-tender with normal pitched bowel sounds.  MUSCULOSKELETAL: There are no major deformities or cyanosis. NEUROLOGIC: No focal weakness or paresthesias are detected. SKIN: There are no ulcers or rashes noted. PSYCHIATRIC: The patient has a normal affect.  DATA:   Bilateral lower extremity venous duplex: I have independently interpreted her venous duplex scan.  On the right side, there is no evidence of DVT or superficial thrombophlebitis. There is deep vein reflux involving the  right common femoral vein,  femoral vein. She also has reflux in the right great saphenous vein.  On the left side, there is no evidence of DVT or superficial thrombophlebitis. There is reflux in the deep system normal in the common femoral vein. There is also reflux in the left great saphenous vein.  MEDICAL ISSUES:  CHRONIC VENOUS INSUFFICIENCY: Based on her exam and venous duplex study, she has evidence of significant chronic venous insufficiency. Although she does have reflux in both great saphenous veins, given her age and limited mobility I do not think she is a good candidate for laser ablation of the saphenous veins. We have discussed the importance of intermittent leg elevation in the proper positioning for this. I have also written her a prescription for knee-high compression stockings with a gradient of 15-20 mmHg. I have encouraged her to avoid prolonged sitting and standing and to ambulate as much as possible. She understands that this is a chronic problem and that she really needs to make elevator legs daily part of her normal routine to stay on top of her swelling. She is not a good candidate for a more invasive workup or intervention. We will see her back as needed.   Deitra Mayo [facility] [phone]

## 2016-03-09 ENCOUNTER — Encounter: Payer: Self-pay | Admitting: Endocrinology

## 2016-03-13 DIAGNOSIS — R310 Gross hematuria: Secondary | ICD-10-CM | POA: Diagnosis not present

## 2016-03-23 DIAGNOSIS — H5210 Myopia, unspecified eye: Secondary | ICD-10-CM | POA: Diagnosis not present

## 2016-03-24 DIAGNOSIS — R160 Hepatomegaly, not elsewhere classified: Secondary | ICD-10-CM | POA: Diagnosis not present

## 2016-03-24 DIAGNOSIS — D3500 Benign neoplasm of unspecified adrenal gland: Secondary | ICD-10-CM | POA: Diagnosis not present

## 2016-03-24 DIAGNOSIS — N281 Cyst of kidney, acquired: Secondary | ICD-10-CM | POA: Diagnosis not present

## 2016-03-24 DIAGNOSIS — R310 Gross hematuria: Secondary | ICD-10-CM | POA: Diagnosis not present

## 2016-03-24 DIAGNOSIS — K7460 Unspecified cirrhosis of liver: Secondary | ICD-10-CM | POA: Diagnosis not present

## 2016-04-19 ENCOUNTER — Encounter: Payer: Commercial Managed Care - HMO | Admitting: Vascular Surgery

## 2016-04-19 ENCOUNTER — Encounter (HOSPITAL_COMMUNITY): Payer: Commercial Managed Care - HMO

## 2016-04-26 DIAGNOSIS — H35371 Puckering of macula, right eye: Secondary | ICD-10-CM | POA: Diagnosis not present

## 2016-04-26 DIAGNOSIS — E113293 Type 2 diabetes mellitus with mild nonproliferative diabetic retinopathy without macular edema, bilateral: Secondary | ICD-10-CM | POA: Diagnosis not present

## 2016-04-26 DIAGNOSIS — H353111 Nonexudative age-related macular degeneration, right eye, early dry stage: Secondary | ICD-10-CM | POA: Diagnosis not present

## 2016-04-26 DIAGNOSIS — H353122 Nonexudative age-related macular degeneration, left eye, intermediate dry stage: Secondary | ICD-10-CM | POA: Diagnosis not present

## 2016-05-30 DIAGNOSIS — N3946 Mixed incontinence: Secondary | ICD-10-CM | POA: Diagnosis not present

## 2016-05-30 DIAGNOSIS — R310 Gross hematuria: Secondary | ICD-10-CM | POA: Diagnosis not present

## 2016-06-02 ENCOUNTER — Other Ambulatory Visit: Payer: Self-pay | Admitting: Urology

## 2016-06-12 NOTE — Patient Instructions (Addendum)
Lauren Crosby  06/12/2016   Your procedure is scheduled on: 06-16-16   Report to [place]  Entrance Take [place]  Elevators to 3rd floor to  [place] [place] at 9:45AM.    Call this number if you have problems the morning of surgery [phone]    Remember: ONLY 1 PERSON MAY GO WITH YOU TO SHORT STAY TO GET  READY MORNING OF [place].  Do not eat food or drink liquids :After Midnight.     Take these medicines the morning of surgery with A SIP OF WATER: Allopurinol (Zyloprim)    DO NOT TAKE ANY DIABETIC MEDICATIONS DAY OF YOUR SURGERY                               You may not have any metal on your body including hair pins and              piercings  Do not wear jewelry, make-up, lotions, powders or perfumes, deodorant             Do not wear nail polish.  Do not shave  48 hours prior to surgery.              Men may shave face and neck.   Do not bring valuables to the hospital. [place].  Contacts, dentures or bridgework may not be worn into surgery.     Patients discharged the day of surgery will not be allowed to drive home.  Name and phone number of your driver: Husband [phone]                Please read over the following fact sheets you were given: _____________________________________________________________________  How to Manage Your Diabetes Before and After Surgery  Why is it important to control my blood sugar before and after surgery? . Improving blood sugar levels before and after surgery helps healing and can limit problems. . A way of improving blood sugar control is eating a healthy diet by: o  Eating less sugar and carbohydrates o  Increasing activity/exercise o  Talking with your doctor about reaching your blood sugar goals . High blood sugars (greater than 180 mg/dL) can raise your risk of infections and slow your recovery, so you will need to focus on controlling  your diabetes during the weeks before surgery. . Make sure that the doctor who takes care of your diabetes knows about your planned surgery including the date and location.  How do I manage my blood sugar before surgery? . Check your blood sugar at least 4 times a day, starting 2 days before surgery, to make sure that the level is not too high or low. o Check your blood sugar the morning of your surgery when you wake up and every 2 hours until you get to the Short Stay unit. . If your blood sugar is less than 70 mg/dL, you will need to treat for low blood sugar: o Do not take insulin. o Treat a low blood sugar (less than 70 mg/dL) with  cup of clear juice (cranberry or apple), 4 glucose tablets, OR glucose gel. o Recheck blood sugar in 15 minutes after treatment (to make sure it is greater than 70 mg/dL).  If your blood sugar is not greater than 70 mg/dL on recheck, call [phone] for further instructions. . Report your blood sugar to the short stay nurse when you get to Short Stay.  . If you are admitted to the hospital after surgery: o Your blood sugar will be checked by the staff and you will probably be given insulin after surgery (instead of oral diabetes medicines) to make sure you have good blood sugar levels. o The goal for blood sugar control after surgery is 80-180 mg/dL.   WHAT DO I DO ABOUT MY DIABETES MEDICATION?  Marland Kitchen Do not take oral diabetes medicines (pills) the morning of surgery.  . THE DAY BEFORE SURGERY, take  42 units of Novolin 70/30 in the morning, and 45 U of Novolin 70/30 at supper            Take your usual dose of Metformin  . The day of surgery, do not take other diabetes injectables, including Byetta (exenatide), Bydureon (exenatide ER), Victoza (liraglutide), or Trulicity (dulaglutide).  . If your CBG is greater than 220 mg/dL, you may take  of your sliding scale  . (correction) dose of insulin.     Patient Signature:  Date:   Nurse  Signature:  Date:   Reviewed and Endorsed by [facility] Patient Education Committee, August 2015   Reviewed and Endorsed by [facility] Patient Education Committee, August 2015 [facility] - Preparing for Surgery Before surgery, you can play an important role.  Because skin is not sterile, your skin needs to be as free of germs as possible.  You can reduce the number of germs on your skin by washing with CHG (chlorahexidine gluconate) soap before surgery.  CHG is an antiseptic cleaner which kills germs and bonds with the skin to continue killing germs even after washing. Please DO NOT use if you have an allergy to CHG or antibacterial soaps.  If your skin becomes reddened/irritated stop using the CHG and inform your nurse when you arrive at Short Stay. Do not shave (including legs and underarms) for at least 48 hours prior to the first CHG shower.  You may shave your face/neck. Please follow these instructions carefully:  1.  Shower with CHG Soap the night before surgery and the  morning of Surgery.  2.  If you choose to wash your hair, wash your hair first as usual with your  normal  shampoo.  3.  After you shampoo, rinse your hair and body thoroughly to remove the  shampoo.                           4.  Use CHG as you would any other liquid soap.  You can apply chg directly  to the skin and wash                       Gently with a scrungie or clean washcloth.  5.  Apply the CHG Soap to your body ONLY FROM THE NECK DOWN.   Do not use on face/ open                           Wound or open sores. Avoid contact with eyes, ears mouth and genitals (private parts).                       Wash face,  Development worker, international aid (private  parts) with your normal soap.             6.  Wash thoroughly, paying special attention to the area where your surgery  will be performed.  7.  Thoroughly rinse your body with warm water from the neck down.  8.  DO NOT shower/wash with your normal soap after using and rinsing off  the CHG  Soap.                9.  Pat yourself dry with a clean towel.            10.  Wear clean pajamas.            11.  Place clean sheets on your bed the night of your first shower and do not  sleep with pets. Day of Surgery : Do not apply any lotions/deodorants the morning of surgery.  Please wear clean clothes to the hospital/surgery center.  FAILURE TO FOLLOW THESE INSTRUCTIONS MAY RESULT IN THE CANCELLATION OF YOUR SURGERY PATIENT SIGNATURE_________________________________  NURSE SIGNATURE__________________________________  ________________________________________________________________________

## 2016-06-13 ENCOUNTER — Ambulatory Visit (HOSPITAL_COMMUNITY)
Admission: RE | Admit: 2016-06-13 | Discharge: 2016-06-13 | Disposition: A | Discharge status: Home / Self Care | Payer: Medicare HMO | Source: Ambulatory Visit | Attending: Anesthesiology | Admitting: Anesthesiology

## 2016-06-13 ENCOUNTER — Encounter (HOSPITAL_COMMUNITY)
Admission: RE | Admit: 2016-06-13 | Discharge: 2016-06-13 | Disposition: A | Discharge status: Home / Self Care | Payer: Medicare HMO | Source: Ambulatory Visit | Attending: Urology | Admitting: Urology

## 2016-06-13 ENCOUNTER — Encounter (HOSPITAL_COMMUNITY): Payer: Self-pay

## 2016-06-13 ENCOUNTER — Encounter (INDEPENDENT_AMBULATORY_CARE_PROVIDER_SITE_OTHER): Payer: Self-pay

## 2016-06-13 DIAGNOSIS — J9811 Atelectasis: Secondary | ICD-10-CM | POA: Insufficient documentation

## 2016-06-13 DIAGNOSIS — I517 Cardiomegaly: Secondary | ICD-10-CM | POA: Diagnosis not present

## 2016-06-13 DIAGNOSIS — Z01812 Encounter for preprocedural laboratory examination: Secondary | ICD-10-CM | POA: Insufficient documentation

## 2016-06-13 DIAGNOSIS — Z01818 Encounter for other preprocedural examination: Secondary | ICD-10-CM | POA: Diagnosis not present

## 2016-06-13 DIAGNOSIS — R9431 Abnormal electrocardiogram [ECG] [EKG]: Secondary | ICD-10-CM | POA: Insufficient documentation

## 2016-06-13 DIAGNOSIS — R918 Other nonspecific abnormal finding of lung field: Secondary | ICD-10-CM | POA: Insufficient documentation

## 2016-06-13 DIAGNOSIS — Z01810 Encounter for preprocedural cardiovascular examination: Secondary | ICD-10-CM | POA: Insufficient documentation

## 2016-06-13 LAB — BASIC METABOLIC PANEL
Anion gap: 9 (ref 5–15)
BUN: 39 mg/dL — AB (ref 6–20)
CALCIUM: 8.9 mg/dL (ref 8.9–10.3)
CO2: 24 mmol/L (ref 22–32)
CREATININE: 1.07 mg/dL — AB (ref 0.44–1.00)
Chloride: 106 mmol/L (ref 101–111)
GFR calc non Af Amer: 47 mL/min — ABNORMAL LOW (ref >60)
GFR, EST AFRICAN AMERICAN: 54 mL/min — AB (ref >60)
Glucose, Bld: 227 mg/dL — ABNORMAL HIGH (ref 65–99)
Potassium: 5.4 mmol/L — ABNORMAL HIGH (ref 3.5–5.1)
Sodium: 139 mmol/L (ref 135–145)

## 2016-06-13 LAB — CBC
HCT: 30.9 % — ABNORMAL LOW (ref 36.0–46.0)
Hemoglobin: 9.4 g/dL — ABNORMAL LOW (ref 12.0–15.0)
MCH: 19.7 pg — AB (ref 26.0–34.0)
MCHC: 30.4 g/dL (ref 30.0–36.0)
MCV: 64.9 fL — ABNORMAL LOW (ref 78.0–100.0)
PLATELETS: 139 10*3/uL — AB (ref 150–400)
RBC: 4.76 MIL/uL (ref 3.87–5.11)
RDW: 17.2 % — ABNORMAL HIGH (ref 11.5–15.5)
WBC: 6.1 10*3/uL (ref 4.0–10.5)

## 2016-06-13 LAB — GLUCOSE, CAPILLARY: GLUCOSE-CAPILLARY: 224 mg/dL — AB (ref 65–99)

## 2016-06-13 NOTE — Progress Notes (Addendum)
Spoke to Dr. Marcell Barlow, Anesthesiologist regarding pt's  EKG results. No new orders at this time.   06-13-16 CBC and CMP's results routed to Dr. Junious Silk for review.

## 2016-06-14 LAB — HEMOGLOBIN A1C
Hgb A1c MFr Bld: 6.6 % — ABNORMAL HIGH (ref 4.8–5.6)
Mean Plasma Glucose: 143 mg/dL

## 2016-06-15 NOTE — H&P (Signed)
Office Visit Report     05/30/2016   --------------------------------------------------------------------------------   Jamison Oka  MRN: [id number]  PRIMARY CARE:  Ishmael Holter. [name], MD  DOB: 14-Jul-1931, 83 year old Female  REFERRING:  Georgette Dover, MD  SSN: -[id number]  PROVIDER:  Festus Aloe, M.D.    LOCATION:  [facility] [phone]   --------------------------------------------------------------------------------   CC: I have blood in my urine.  HPI: Lauren Crosby is a 83 year-old female established patient who is here for blood in the urine.  She did see the blood in her urine. She first noticed the symptoms approximately 09/10/2014. She has not seen blood clots.   She does not have a burning sensation when she urinates. She is not currently having trouble urinating.   Her last U/S or CT Scan was 03/24/2016.   CT Feb 2017 and cysto Mar 2017 - negative.   She reports red urine last night. She also noticed it about 2 months ago. No clots. No dysuria. She leaks with a cough or sneeze. Noc x 0-1.   She returns for cystoscopy. She missed the hat again today and voided in the toilet. No fever or dysuria.       CC: I have mixed incontinence.  HPI: She does have problems getting to the bathroom in time after she has the urge to urinate. She has had an accident when she couldn't get to the bathroom in time . She has 3 episodes of urge incontinence per day. Her urge incontinence began approximately 11/10/2015. She leaks when she coughs, laughs, sneezes or bears down. Her symptoms have gotten worse over the last year. She does wear protective pads.   She is not having problems with emptying her bladder well.   She can't walk well and uses a walker. She slowly transfer to the toilet.      ALLERGIES: Penicillins    MEDICATIONS: Allopurinol 300 MG Oral Tablet Oral  Centrum Silver 0.4 mg-300 mcg-250 mcg tablet Oral  Losartan Potassium 100 mg tablet  Oral  MetFORMIN HCl - 1000 MG Oral Tablet Oral  NovoLOG Mix 70/30 (70-30) 100 UNIT/ML Subcutaneous Suspension Subcutaneous  Preservision Lutein 226 mg-200 unit-5 mg-0.8 mg-34.8 mg capsule Oral  Simvastatin 80 mg tablet Oral     GU PSH: Hysterectomy Unilat SO - 02/05/2015 Locm 300-399Mg /Ml Iodine,1Ml - 03/24/2016      PSH Notes: Knee Arthroplasty, Cholecystectomy, Tonsillectomy, Rotator Cuff Repair, Hernia Repair, Total Abdominal Hysterectomy, Gastric Surgery, Appendectomy   NON-GU PSH: Appendectomy - 02/05/2015 Cholecystectomy (open) - 02/05/2015 Hernia Repair - 02/05/2015 Remove Tonsils - 02/05/2015    GU PMH: Gross hematuria - 03/13/2016, Gross hematuria, - 03/29/2015 History of urolithiasis, History of renal calculi - 02/05/2015    NON-GU PMH: Diverticulosis, Diverticulosis - 02/05/2015 Encounter for general adult medical examination without abnormal findings, Encounter for preventive health examination - 02/05/2015 Personal history of diseases of the blood and blood-forming organs and certain disorders involving the immune mechanism, History of anemia - 02/05/2015 Personal history of Methicillin resistant Staphylococcus aureus infection, History of methicillin resistant Staphylococcus aureus infection - 02/05/2015 Personal history of other diseases of the circulatory system, History of hypertension - 02/05/2015 Personal history of other diseases of the musculoskeletal system and connective tissue, History of gout - 02/05/2015 Personal history of other endocrine, nutritional and metabolic disease, History of diabetes mellitus - 02/05/2015, History of hypercholesterolemia, - 02/05/2015    FAMILY HISTORY: blood dyscrasia - Runs In Family hemochromatosis - Runs In Family renal failure -  Runs In Family   SOCIAL HISTORY: Marital Status: Married Current Smoking Status: Patient has never smoked.   Tobacco Use Assessment Completed: Used Tobacco in last 30 days? Drinks 2 caffeinated drinks per day.      Notes: Never a smoker, Six children, Married, No alcohol use, Daily caffeine consumption, 2-3 servings a day   REVIEW OF SYSTEMS:    GU Review Female:   Patient reports frequent urination. Patient denies hard to postpone urination, burning /pain with urination, get up at night to urinate, leakage of urine, stream starts and stops, trouble starting your stream, have to strain to urinate, and being pregnant.  Gastrointestinal (Upper):   Patient denies nausea, vomiting, and indigestion/ heartburn.  Gastrointestinal (Lower):   Patient reports constipation. Patient denies diarrhea.  Constitutional:   Patient denies fever, night sweats, weight loss, and fatigue.  Skin:   Patient denies skin rash/ lesion and itching.  Eyes:   Patient reports blurred vision. Patient denies double vision.  Ears/ Nose/ Throat:   Patient denies sore throat and sinus problems.  Hematologic/Lymphatic:   Patient denies swollen glands and easy bruising.  Cardiovascular:   Patient reports leg swelling. Patient denies chest pains.  Respiratory:   Patient denies cough and shortness of breath.  Endocrine:   Patient denies excessive thirst.  Musculoskeletal:   Patient denies back pain and joint pain.  Neurological:   Patient denies headaches and dizziness.  Psychologic:   Patient denies depression and anxiety.   VITAL SIGNS:      05/30/2016 02:30 PM  BP 162/70 mmHg  Pulse 97 /min  Temperature 97.6 F / 36 C   GU PHYSICAL EXAMINATION:    External Genitalia: No hirsutism, no rash, no scarring, no cyst, no erythematous lesion, no papular lesion, no blanched lesion, no warty lesion. No edema.  Urethral Meatus: Normal size. Normal position. No discharge.  Urethra: No tenderness, no mass, no scarring. No hypermobility. No leakage.  Bladder: Normal to palpation, no tenderness, no mass, normal size.  Vagina: Moderate vaginal atrophy. No stenosis. No rectocele. No cystocele. No enterocele.    MULTI-SYSTEM PHYSICAL EXAMINATION:     Constitutional: Well-nourished. No physical deformities. Normally developed. Good grooming.  Neck: Neck symmetrical, not swollen. Normal tracheal position.  Respiratory: No labored breathing, no use of accessory muscles.   Cardiovascular: Normal temperature, normal extremity pulses, no swelling, no varicosities.  Neurologic / Psychiatric: Oriented to time, oriented to place, oriented to person. No depression, no anxiety, no agitation.     PAST DATA REVIEWED:  Source Of History:  Patient   PROCEDURES:         Flexible Cystoscopy - 52000  Risks, benefits, and some of the potential complications of the procedure were discussed at length with the patient including infection, bleeding, voiding discomfort, urinary retention, fever, chills, sepsis, and others. All questions were answered. Informed consent was obtained. Antibiotic prophylaxis was given. Sterile technique and intraurethral analgesia were used. Chaperone - Hassan Rowan and Brady - for exam and cystoscopy.   Meatus:  Normal size. Normal location. Normal condition.  Urethra:  No hypermobility. No leakage.  Ureteral Orifices:  Normal location. Normal size. Normal shape. Effluxed clear urine.  Bladder:  A right lateral wall tumor - looks like a small patch of early papillary changes. No erythema. At first I thought it was the mucosa seen as the bladder wasn't filled because the left side looked similar but it persisted after filling on the right. Urine was clear. No trabeculation. Normal mucosa. No stones.  The lower urinary tract was carefully examined. The procedure was well-tolerated and without complications. Antibiotic instructions were given. Instructions were given to call the office immediately for bloody urine, difficulty urinating, urinary retention, painful or frequent urination, fever, chills, nausea, vomiting or other illness. The patient stated that she understood these instructions and would comply with them.   ASSESSMENT:       ICD-10 Details  1 GU:   Gross hematuria - R31.0   2   Mixed incontinence - N39.46    PLAN:           Schedule Return Visit/Planned Activity: Next Available Appointment - Schedule Surgery          Document Letter(s):  Created for Patient: Clinical Summary         Notes:   gross hematuria - maybe early papillary tumor on right bladder. Discussed with the patient and her husband the nature risks and benefits of cystoscopy with bladder biopsy fulguration versus continued surveillance. Biopsy and anesthesia is risky with her weight. She had DOE just getting onto table and lifting her legs today. They want to proceed with bladder biopsy.   Mixed incontinence-discussed with the patient and her husband the nature risks and benefits of trial of anticholinergic and myrbetric and she elects to proceed. I gave them samples of both.   cc: Dr. [name]     * Signed by Festus Aloe, M.D. on 05/30/16 at 4:50 PM (EDT)*     The information contained in this medical record document is considered private and confidential patient information. This information can only be used for the medical diagnosis and/or medical services that are being provided by the patient's selected caregivers. This information can only be distributed outside of the patient's care if the patient agrees and signs waivers of authorization for this information to be sent to an outside source or route.

## 2016-06-16 ENCOUNTER — Ambulatory Visit (HOSPITAL_COMMUNITY): Payer: Medicare HMO | Admitting: Certified Registered Nurse Anesthetist

## 2016-06-16 ENCOUNTER — Ambulatory Visit (HOSPITAL_COMMUNITY)
Admission: RE | Admit: 2016-06-16 | Discharge: 2016-06-16 | Disposition: A | Discharge status: Home / Self Care | Payer: Medicare HMO | Source: Ambulatory Visit | Attending: Urology | Admitting: Urology

## 2016-06-16 ENCOUNTER — Encounter (HOSPITAL_COMMUNITY): Payer: Self-pay | Admitting: *Deleted

## 2016-06-16 ENCOUNTER — Encounter (HOSPITAL_COMMUNITY)
Admission: RE | Disposition: A | Discharge status: Home / Self Care | Payer: Self-pay | Source: Ambulatory Visit | Attending: Urology

## 2016-06-16 DIAGNOSIS — Z9049 Acquired absence of other specified parts of digestive tract: Secondary | ICD-10-CM | POA: Insufficient documentation

## 2016-06-16 DIAGNOSIS — Z6841 Body Mass Index (BMI) 40.0 and over, adult: Secondary | ICD-10-CM | POA: Diagnosis not present

## 2016-06-16 DIAGNOSIS — Z880 Allergy status to penicillin: Secondary | ICD-10-CM | POA: Insufficient documentation

## 2016-06-16 DIAGNOSIS — C679 Malignant neoplasm of bladder, unspecified: Secondary | ICD-10-CM | POA: Diagnosis present

## 2016-06-16 DIAGNOSIS — Z79899 Other long term (current) drug therapy: Secondary | ICD-10-CM | POA: Insufficient documentation

## 2016-06-16 DIAGNOSIS — Z794 Long term (current) use of insulin: Secondary | ICD-10-CM | POA: Insufficient documentation

## 2016-06-16 DIAGNOSIS — Z841 Family history of disorders of kidney and ureter: Secondary | ICD-10-CM | POA: Insufficient documentation

## 2016-06-16 DIAGNOSIS — D414 Neoplasm of uncertain behavior of bladder: Secondary | ICD-10-CM | POA: Diagnosis not present

## 2016-06-16 DIAGNOSIS — Z87442 Personal history of urinary calculi: Secondary | ICD-10-CM | POA: Insufficient documentation

## 2016-06-16 DIAGNOSIS — C672 Malignant neoplasm of lateral wall of bladder: Secondary | ICD-10-CM | POA: Insufficient documentation

## 2016-06-16 DIAGNOSIS — D649 Anemia, unspecified: Secondary | ICD-10-CM | POA: Diagnosis not present

## 2016-06-16 DIAGNOSIS — N3946 Mixed incontinence: Secondary | ICD-10-CM | POA: Diagnosis not present

## 2016-06-16 DIAGNOSIS — Z832 Family history of diseases of the blood and blood-forming organs and certain disorders involving the immune mechanism: Secondary | ICD-10-CM | POA: Diagnosis not present

## 2016-06-16 DIAGNOSIS — D494 Neoplasm of unspecified behavior of bladder: Secondary | ICD-10-CM | POA: Diagnosis not present

## 2016-06-16 DIAGNOSIS — D090 Carcinoma in situ of bladder: Secondary | ICD-10-CM | POA: Diagnosis not present

## 2016-06-16 DIAGNOSIS — Z90710 Acquired absence of both cervix and uterus: Secondary | ICD-10-CM | POA: Diagnosis not present

## 2016-06-16 DIAGNOSIS — R310 Gross hematuria: Secondary | ICD-10-CM | POA: Insufficient documentation

## 2016-06-16 DIAGNOSIS — I10 Essential (primary) hypertension: Secondary | ICD-10-CM | POA: Diagnosis not present

## 2016-06-16 DIAGNOSIS — E119 Type 2 diabetes mellitus without complications: Secondary | ICD-10-CM | POA: Diagnosis not present

## 2016-06-16 HISTORY — PX: CYSTOSCOPY WITH BIOPSY: SHX5122

## 2016-06-16 LAB — GLUCOSE, CAPILLARY
GLUCOSE-CAPILLARY: 140 mg/dL — AB (ref 65–99)
Glucose-Capillary: 152 mg/dL — ABNORMAL HIGH (ref 65–99)
Glucose-Capillary: 139 mg/dL — ABNORMAL HIGH (ref 65–99)

## 2016-06-16 SURGERY — CYSTOSCOPY, WITH BIOPSY
Anesthesia: General

## 2016-06-16 MED ORDER — ONDANSETRON HCL 4 MG/2ML IJ SOLN
INTRAMUSCULAR | Status: AC
  Filled 2016-06-16: qty 2

## 2016-06-16 MED ORDER — PHENYLEPHRINE 40 MCG/ML (10ML) SYRINGE FOR IV PUSH (FOR BLOOD PRESSURE SUPPORT)
PREFILLED_SYRINGE | INTRAVENOUS | Status: AC
  Filled 2016-06-16: qty 10

## 2016-06-16 MED ORDER — STERILE WATER FOR IRRIGATION IR SOLN
Status: DC | PRN
  Administered 2016-06-16: 3000 mL

## 2016-06-16 MED ORDER — ONDANSETRON HCL 4 MG/2ML IJ SOLN
INTRAMUSCULAR | Status: DC | PRN
  Administered 2016-06-16 (×2): 2 mg via INTRAVENOUS

## 2016-06-16 MED ORDER — MIDAZOLAM HCL 2 MG/2ML IJ SOLN
INTRAMUSCULAR | Status: AC
  Filled 2016-06-16: qty 2

## 2016-06-16 MED ORDER — SODIUM CHLORIDE 0.9 % IR SOLN
Status: DC | PRN
  Administered 2016-06-16: 3000 mL

## 2016-06-16 MED ORDER — LEVOFLOXACIN IN D5W 750 MG/150ML IV SOLN
750.0000 mg | INTRAVENOUS | Status: AC
  Administered 2016-06-16: 750 mg via INTRAVENOUS
  Filled 2016-06-16: qty 150

## 2016-06-16 MED ORDER — DEXAMETHASONE SODIUM PHOSPHATE 10 MG/ML IJ SOLN
INTRAMUSCULAR | Status: DC | PRN
  Administered 2016-06-16: 10 mg via INTRAVENOUS

## 2016-06-16 MED ORDER — FENTANYL CITRATE (PF) 100 MCG/2ML IJ SOLN
INTRAMUSCULAR | Status: AC
  Filled 2016-06-16: qty 2

## 2016-06-16 MED ORDER — PROPOFOL 10 MG/ML IV BOLUS
INTRAVENOUS | Status: DC | PRN
  Administered 2016-06-16: 100 mg via INTRAVENOUS

## 2016-06-16 MED ORDER — LACTATED RINGERS IV SOLN
INTRAVENOUS | Status: DC
  Administered 2016-06-16: 10:00:00 via INTRAVENOUS

## 2016-06-16 MED ORDER — PROPOFOL 10 MG/ML IV BOLUS
INTRAVENOUS | Status: AC
  Filled 2016-06-16: qty 20

## 2016-06-16 MED ORDER — MIDAZOLAM HCL 5 MG/5ML IJ SOLN
INTRAMUSCULAR | Status: DC | PRN
  Administered 2016-06-16 (×2): 0.5 mg via INTRAVENOUS

## 2016-06-16 MED ORDER — ALBUTEROL SULFATE HFA 108 (90 BASE) MCG/ACT IN AERS
INHALATION_SPRAY | RESPIRATORY_TRACT | Status: AC
  Filled 2016-06-16: qty 6.7

## 2016-06-16 MED ORDER — LIDOCAINE 2% (20 MG/ML) 5 ML SYRINGE
INTRAMUSCULAR | Status: AC
  Filled 2016-06-16: qty 5

## 2016-06-16 MED ORDER — SODIUM CHLORIDE 0.9 % IV SOLN
INTRAVENOUS | Status: DC | PRN
  Administered 2016-06-16: 13:00:00 via INTRAVENOUS

## 2016-06-16 MED ORDER — EPHEDRINE 5 MG/ML INJ
INTRAVENOUS | Status: AC
  Filled 2016-06-16: qty 10

## 2016-06-16 MED ORDER — EPHEDRINE SULFATE 50 MG/ML IJ SOLN
INTRAMUSCULAR | Status: DC | PRN
  Administered 2016-06-16 (×2): 10 mg via INTRAVENOUS

## 2016-06-16 MED ORDER — DEXAMETHASONE SODIUM PHOSPHATE 10 MG/ML IJ SOLN
INTRAMUSCULAR | Status: AC
  Filled 2016-06-16: qty 1

## 2016-06-16 MED ORDER — PHENYLEPHRINE HCL 10 MG/ML IJ SOLN
INTRAMUSCULAR | Status: DC | PRN
  Administered 2016-06-16: 40 ug via INTRAVENOUS
  Administered 2016-06-16: 20 ug via INTRAVENOUS
  Administered 2016-06-16 (×2): 40 ug via INTRAVENOUS

## 2016-06-16 MED ORDER — FENTANYL CITRATE (PF) 100 MCG/2ML IJ SOLN
INTRAMUSCULAR | Status: DC | PRN
  Administered 2016-06-16 (×2): 25 ug via INTRAVENOUS
  Administered 2016-06-16: 50 ug via INTRAVENOUS

## 2016-06-16 MED ORDER — LIDOCAINE HCL (CARDIAC) 20 MG/ML IV SOLN
INTRAVENOUS | Status: DC | PRN
  Administered 2016-06-16: 60 mg via INTRAVENOUS

## 2016-06-16 SURGICAL SUPPLY — 10 items
BAG URINE DRAINAGE (UROLOGICAL SUPPLIES) IMPLANT
BAG URO CATCHER STRL LF (MISCELLANEOUS) ×3 IMPLANT
COVER SURGICAL LIGHT HANDLE (MISCELLANEOUS) ×3 IMPLANT
GLOVE BIOGEL M STRL SZ7.5 (GLOVE) ×3 IMPLANT
GOWN STRL REUS W/TWL XL LVL3 (GOWN DISPOSABLE) ×3 IMPLANT
LOOP CUT BIPOLAR 24F LRG (ELECTROSURGICAL) ×2 IMPLANT
MANIFOLD NEPTUNE II (INSTRUMENTS) ×3 IMPLANT
PACK CYSTO (CUSTOM PROCEDURE TRAY) ×3 IMPLANT
TUBING CONNECTING 10 (TUBING) ×2 IMPLANT
TUBING CONNECTING 10' (TUBING) ×1

## 2016-06-16 NOTE — Progress Notes (Signed)
  Pt resting in PACU. She said she is short of breath, but has been "short of breath my whole life". Her husband says it's been at least 3 years. Pt denies CP.   She is resting comfortably in PACU NAD Resp rate and effort appear normal CV - rrr abd - soft NT Ext - chronic venous stasis, no obvious pitting edema (skin and legs are both large and tense)    A/P - stable post-op. May proceed to Phase 2.

## 2016-06-16 NOTE — Progress Notes (Signed)
Dr Junious Silk at bedside.

## 2016-06-16 NOTE — Interval H&P Note (Signed)
History and Physical Interval Note:  06/16/2016 12:13 PM  Lauren Crosby  has presented today for surgery, with the diagnosis of BLADDER NEOPLASM   The various methods of treatment have been discussed with the patient and family. After consideration of risks, benefits and other options for treatment, the patient has consented to  Procedure(s): CYSTOSCOPY WITH BIOPSY (N/A) as a surgical intervention .  The patient's history has been reviewed, patient examined, no change in status, stable for surgery.  I have reviewed the patient's chart and labs.  Questions were answered to the patient's satisfaction.  Pt denies further hematuria. No dysuria or fever. She elects to proceed.    Shantoria Ellwood

## 2016-06-16 NOTE — Op Note (Signed)
Preoperative diagnosis: Gross hematuria, bladder neoplasm of uncertain they've your Postoperative diagnosis: Same  Procedure: Cystoscopy with bladder biopsy and fulguration  Indication for procedure: 83 year old female with recurrent gross hematuria. On office cystoscopy there was an area suspicious for early papillary growth on the right lateral bladder wall. She was brought today for bladder biopsy.  Findings: On exam under anesthesia the introitus was enlarged, no vaginal masses noted or palpated. Bladder and urethra were palpably normal. Atrophic vaginitis present.  On cystoscopy the urethra and trigone and ureteral orifices all appeared normal. There was clear efflux. On the right lateral bladder wall there was a patch of papillary tumor. This was biopsied with the cold cup and then fulgurated with the loop. A small patch of red was fulgurated on the left bladder wall.  Description of procedure: After consent was obtained the patient was brought to the operating room. After adequate anesthesia she was placed in lithotomy position and prepped and draped in the usual sterile fashion. A timeout was performed to confirm the patient and procedure. Anesthesia reports the patient was wheezing for a few months preoperatively and was treated with albuterol and cleared up. The cystoscope was passed per urethra and I carefully examined the bladder with the 30 and 70 lens. I then took the cold cup biopsy forceps a biopsy of the right papillary appearing tumor. Because of the angle I thought it was easiest to cauterize this with the loop and get better contact. The whole patch of papillary area was fulgurated probably about 15 mm. There was a small 10 mm area on the left that was subtle and also fulgurated this. Hemostasis was excellent at low-pressure the bladder was drained and the scope removed. She was awakened and taken to the recovery room in stable condition.   Complications: None  Blood loss:  Minimal  Specimens: Right bladder biopsy to pathology  Drains: None  Disposition: Patient stable to PACU.

## 2016-06-16 NOTE — Transfer of Care (Signed)
Immediate Anesthesia Transfer of Care Note  Patient: Lauren Crosby  Procedure(s) Performed: Procedure(s): CYSTOSCOPY WITH  BLADDER BIOPSY AND FULGERATION 1CM (N/A)  Patient Location: PACU  Anesthesia Type:General  Level of Consciousness: awake, oriented, patient cooperative, lethargic and responds to stimulation  Airway & Oxygen Therapy: Patient Spontanous Breathing and Patient connected to face mask oxygen  Post-op Assessment: Report given to RN, Post -op Vital signs reviewed and stable and Patient moving all extremities  Post vital signs: Reviewed and stable  Last Vitals:  Vitals:   06/16/16 0937  BP: (!) 164/75  Pulse: (!) 102  Resp: (!) 22  Temp: 36.6 C    Last Pain:  Vitals:   06/16/16 0937  TempSrc: Oral      Patients Stated Pain Goal: 3 (36/14/43 1540)  Complications: No apparent anesthesia complications

## 2016-06-16 NOTE — Anesthesia Preprocedure Evaluation (Addendum)
Anesthesia Evaluation  Patient identified by MRN, date of birth, ID band Patient awake    Reviewed: Allergy & Precautions, NPO status , Patient's Chart, lab work & pertinent test results  Airway Mallampati: II       Dental  (+) Poor Dentition   Pulmonary     + decreased breath sounds+ wheezing      Cardiovascular hypertension, Pt. on medications Normal cardiovascular exam Rhythm:Regular Rate:Normal     Neuro/Psych negative neurological ROS     GI/Hepatic   Endo/Other  diabetes, Oral Hypoglycemic Agents, Insulin DependentMorbid obesity  Renal/GU Renal InsufficiencyRenal disease  negative genitourinary   Musculoskeletal   Abdominal (+) + obese,   Peds  Hematology  (+) Blood dyscrasia, anemia ,   Anesthesia Other Findings   Reproductive/Obstetrics                            Anesthesia Physical Anesthesia Plan  ASA: III  Anesthesia Plan: General   Post-op Pain Management:    Induction: Intravenous  PONV Risk Score and Plan: 4 or greater and Ondansetron, Dexamethasone, Propofol, Midazolam, Scopolamine patch - Pre-op and Treatment may vary due to age  Airway Management Planned: LMA  Additional Equipment:   Intra-op Plan:   Post-operative Plan:   Informed Consent: I have reviewed the patients History and Physical, chart, labs and discussed the procedure including the risks, benefits and alternatives for the proposed anesthesia with the patient or authorized representative who has indicated his/her understanding and acceptance.     Plan Discussed with: CRNA and Surgeon  Anesthesia Plan Comments:         Anesthesia Quick Evaluation

## 2016-06-16 NOTE — Anesthesia Postprocedure Evaluation (Signed)
Anesthesia Post Note  Patient: Lauren Crosby  Procedure(s) Performed: Procedure(s) (LRB): CYSTOSCOPY WITH  BLADDER BIOPSY AND FULGERATION 1CM (N/A)     Patient location during evaluation: PACU Anesthesia Type: General Level of consciousness: awake Pain management: pain level controlled Vital Signs Assessment: post-procedure vital signs reviewed and stable Respiratory status: spontaneous breathing Cardiovascular status: stable Postop Assessment: no signs of nausea or vomiting Anesthetic complications: no    Last Vitals:  Vitals:   06/16/16 1400 06/16/16 1413  BP: 127/73 (!) 144/61  Pulse: 93 93  Resp: 14 16  Temp:  36.2 C    Last Pain:  Vitals:   06/16/16 1422  TempSrc:   PainSc: 0-No pain   Pain Goal: Patients Stated Pain Goal: 3 (06/16/16 0951)               Julietta Batterman JR,JOHN Mateo Flow

## 2016-06-16 NOTE — Progress Notes (Signed)
Dr. Hatchett at bedside.  

## 2016-06-16 NOTE — Anesthesia Procedure Notes (Signed)
Procedure Name: LMA Insertion Date/Time: 06/16/2016 12:43 PM Performed by: Lyn Hollingshead Pre-anesthesia Checklist: Patient identified, Emergency Drugs available, Suction available, Patient being monitored and Timeout performed Patient Re-evaluated:Patient Re-evaluated prior to inductionOxygen Delivery Method: Circle system utilized Preoxygenation: Pre-oxygenation with 100% oxygen Intubation Type: IV induction LMA: LMA with gastric port inserted LMA Size: 4.0 Number of attempts: 1 Placement Confirmation: positive ETCO2 and breath sounds checked- equal and bilateral Tube secured with: Tape Dental Injury: Teeth and Oropharynx as per pre-operative assessment

## 2016-06-16 NOTE — Discharge Instructions (Signed)
Bladder Biopsy, Care After Refer to this sheet in the next few weeks. These instructions provide you with information about caring for yourself after your procedure. Your health care provider may also give you more specific instructions. Your treatment has been planned according to current medical practices, but problems sometimes occur. Call your health care provider if you have any problems or questions after your procedure. What can I expect after the procedure? After the procedure, it is common to have:  Mild pain in your bladder or kidney area during urination.  Minor burning during urination.  Small amounts of blood in your urine.  A sudden urge to urinate.  A need to urinate more often than usual.  Follow these instructions at home: Medicines  Take over-the-counter and prescription medicines only as told by your health care provider.  If you were prescribed an antibiotic medicine, take it as told by your health care provider. Do not stop taking the antibiotic even if you start to feel better. General instructions   Hold a warm, damp washcloth over the urethral area to ease pain.  Return to your normal activities as told by your health care provider. Ask your health care provider what activities are safe for you.  Do not drive for 24 hours if you received a medicine to help you relax (sedative) during your procedure. Ask your health care provider when it is safe for you to drive.  It is your responsibility to get the results of your procedure. Ask your health care provider or the department performing the procedure when your results will be ready.  Keep all follow-up visits as told by your health care provider. This is important. Contact a health care provider if:  You have a fever.  Your symptoms do not improve within 24 hours and you continue to have: ? Burning during urination. ? Increasing amounts of blood in your urine. ? Pain during urination. ? An urgent need to  urinate. ? A need to urinate more often than usual. Get help right away if:  You have a lot of bleeding or more bleeding.  You have severe pain.  You are unable to urinate.  You have bright red blood in your urine.  You are passing blood clots in your urine.  You have a fever.  You have swelling, redness, or pain in your legs.  You have difficulty breathing. This information is not intended to replace advice given to you by your health care provider. Make sure you discuss any questions you have with your health care provider. Document Released: 01/12/2015 Document Revised: 06/03/2015 Document Reviewed: 01/12/2015 Elsevier Interactive Patient Education  [employer].

## 2016-06-21 DIAGNOSIS — E11319 Type 2 diabetes mellitus with unspecified diabetic retinopathy without macular edema: Secondary | ICD-10-CM | POA: Diagnosis not present

## 2016-06-21 DIAGNOSIS — E048 Other specified nontoxic goiter: Secondary | ICD-10-CM | POA: Diagnosis not present

## 2016-06-21 DIAGNOSIS — N08 Glomerular disorders in diseases classified elsewhere: Secondary | ICD-10-CM | POA: Diagnosis not present

## 2016-06-21 DIAGNOSIS — E1129 Type 2 diabetes mellitus with other diabetic kidney complication: Secondary | ICD-10-CM | POA: Diagnosis not present

## 2016-06-21 DIAGNOSIS — I10 Essential (primary) hypertension: Secondary | ICD-10-CM | POA: Diagnosis not present

## 2016-06-21 DIAGNOSIS — D649 Anemia, unspecified: Secondary | ICD-10-CM | POA: Diagnosis not present

## 2016-06-21 DIAGNOSIS — R600 Localized edema: Secondary | ICD-10-CM | POA: Diagnosis not present

## 2016-06-21 DIAGNOSIS — E784 Other hyperlipidemia: Secondary | ICD-10-CM | POA: Diagnosis not present

## 2016-06-21 DIAGNOSIS — C679 Malignant neoplasm of bladder, unspecified: Secondary | ICD-10-CM | POA: Diagnosis not present

## 2016-09-12 DIAGNOSIS — E1129 Type 2 diabetes mellitus with other diabetic kidney complication: Secondary | ICD-10-CM | POA: Diagnosis not present

## 2016-09-12 DIAGNOSIS — L03116 Cellulitis of left lower limb: Secondary | ICD-10-CM | POA: Diagnosis not present

## 2016-09-12 DIAGNOSIS — K5900 Constipation, unspecified: Secondary | ICD-10-CM | POA: Diagnosis not present

## 2016-09-12 DIAGNOSIS — R635 Abnormal weight gain: Secondary | ICD-10-CM | POA: Diagnosis not present

## 2016-09-12 DIAGNOSIS — I10 Essential (primary) hypertension: Secondary | ICD-10-CM | POA: Diagnosis not present

## 2016-09-12 DIAGNOSIS — M79606 Pain in leg, unspecified: Secondary | ICD-10-CM | POA: Diagnosis not present

## 2016-09-12 DIAGNOSIS — R600 Localized edema: Secondary | ICD-10-CM | POA: Diagnosis not present

## 2016-09-12 DIAGNOSIS — Z6841 Body Mass Index (BMI) 40.0 and over, adult: Secondary | ICD-10-CM | POA: Diagnosis not present

## 2016-09-12 DIAGNOSIS — I872 Venous insufficiency (chronic) (peripheral): Secondary | ICD-10-CM | POA: Diagnosis not present

## 2016-09-12 DIAGNOSIS — C679 Malignant neoplasm of bladder, unspecified: Secondary | ICD-10-CM | POA: Diagnosis not present

## 2016-09-12 DIAGNOSIS — R32 Unspecified urinary incontinence: Secondary | ICD-10-CM | POA: Diagnosis not present

## 2016-09-12 DIAGNOSIS — E119 Type 2 diabetes mellitus without complications: Secondary | ICD-10-CM | POA: Diagnosis not present

## 2016-09-18 DIAGNOSIS — I872 Venous insufficiency (chronic) (peripheral): Secondary | ICD-10-CM | POA: Diagnosis not present

## 2016-09-18 DIAGNOSIS — E785 Hyperlipidemia, unspecified: Secondary | ICD-10-CM | POA: Diagnosis not present

## 2016-09-18 DIAGNOSIS — I10 Essential (primary) hypertension: Secondary | ICD-10-CM | POA: Diagnosis not present

## 2016-09-18 DIAGNOSIS — Z96651 Presence of right artificial knee joint: Secondary | ICD-10-CM | POA: Diagnosis not present

## 2016-09-18 DIAGNOSIS — L97211 Non-pressure chronic ulcer of right calf limited to breakdown of skin: Secondary | ICD-10-CM | POA: Diagnosis not present

## 2016-09-18 DIAGNOSIS — E1140 Type 2 diabetes mellitus with diabetic neuropathy, unspecified: Secondary | ICD-10-CM | POA: Diagnosis not present

## 2016-09-18 DIAGNOSIS — C679 Malignant neoplasm of bladder, unspecified: Secondary | ICD-10-CM | POA: Diagnosis not present

## 2016-09-18 DIAGNOSIS — Z794 Long term (current) use of insulin: Secondary | ICD-10-CM | POA: Diagnosis not present

## 2016-09-18 DIAGNOSIS — M109 Gout, unspecified: Secondary | ICD-10-CM | POA: Diagnosis not present

## 2016-09-22 DIAGNOSIS — I10 Essential (primary) hypertension: Secondary | ICD-10-CM | POA: Diagnosis not present

## 2016-09-22 DIAGNOSIS — E785 Hyperlipidemia, unspecified: Secondary | ICD-10-CM | POA: Diagnosis not present

## 2016-09-22 DIAGNOSIS — E1140 Type 2 diabetes mellitus with diabetic neuropathy, unspecified: Secondary | ICD-10-CM | POA: Diagnosis not present

## 2016-09-22 DIAGNOSIS — I872 Venous insufficiency (chronic) (peripheral): Secondary | ICD-10-CM | POA: Diagnosis not present

## 2016-09-22 DIAGNOSIS — C679 Malignant neoplasm of bladder, unspecified: Secondary | ICD-10-CM | POA: Diagnosis not present

## 2016-09-22 DIAGNOSIS — Z96651 Presence of right artificial knee joint: Secondary | ICD-10-CM | POA: Diagnosis not present

## 2016-09-22 DIAGNOSIS — Z794 Long term (current) use of insulin: Secondary | ICD-10-CM | POA: Diagnosis not present

## 2016-09-22 DIAGNOSIS — M109 Gout, unspecified: Secondary | ICD-10-CM | POA: Diagnosis not present

## 2016-09-22 DIAGNOSIS — L97211 Non-pressure chronic ulcer of right calf limited to breakdown of skin: Secondary | ICD-10-CM | POA: Diagnosis not present

## 2016-09-26 DIAGNOSIS — Z96651 Presence of right artificial knee joint: Secondary | ICD-10-CM | POA: Diagnosis not present

## 2016-09-26 DIAGNOSIS — E785 Hyperlipidemia, unspecified: Secondary | ICD-10-CM | POA: Diagnosis not present

## 2016-09-26 DIAGNOSIS — I872 Venous insufficiency (chronic) (peripheral): Secondary | ICD-10-CM | POA: Diagnosis not present

## 2016-09-26 DIAGNOSIS — C679 Malignant neoplasm of bladder, unspecified: Secondary | ICD-10-CM | POA: Diagnosis not present

## 2016-09-26 DIAGNOSIS — E1140 Type 2 diabetes mellitus with diabetic neuropathy, unspecified: Secondary | ICD-10-CM | POA: Diagnosis not present

## 2016-09-26 DIAGNOSIS — I10 Essential (primary) hypertension: Secondary | ICD-10-CM | POA: Diagnosis not present

## 2016-09-26 DIAGNOSIS — M109 Gout, unspecified: Secondary | ICD-10-CM | POA: Diagnosis not present

## 2016-09-26 DIAGNOSIS — L97211 Non-pressure chronic ulcer of right calf limited to breakdown of skin: Secondary | ICD-10-CM | POA: Diagnosis not present

## 2016-09-26 DIAGNOSIS — Z794 Long term (current) use of insulin: Secondary | ICD-10-CM | POA: Diagnosis not present

## 2016-09-29 DIAGNOSIS — M109 Gout, unspecified: Secondary | ICD-10-CM | POA: Diagnosis not present

## 2016-09-29 DIAGNOSIS — I872 Venous insufficiency (chronic) (peripheral): Secondary | ICD-10-CM | POA: Diagnosis not present

## 2016-09-29 DIAGNOSIS — L97211 Non-pressure chronic ulcer of right calf limited to breakdown of skin: Secondary | ICD-10-CM | POA: Diagnosis not present

## 2016-09-29 DIAGNOSIS — E785 Hyperlipidemia, unspecified: Secondary | ICD-10-CM | POA: Diagnosis not present

## 2016-09-29 DIAGNOSIS — Z794 Long term (current) use of insulin: Secondary | ICD-10-CM | POA: Diagnosis not present

## 2016-09-29 DIAGNOSIS — Z96651 Presence of right artificial knee joint: Secondary | ICD-10-CM | POA: Diagnosis not present

## 2016-09-29 DIAGNOSIS — C679 Malignant neoplasm of bladder, unspecified: Secondary | ICD-10-CM | POA: Diagnosis not present

## 2016-09-29 DIAGNOSIS — I10 Essential (primary) hypertension: Secondary | ICD-10-CM | POA: Diagnosis not present

## 2016-09-29 DIAGNOSIS — E1140 Type 2 diabetes mellitus with diabetic neuropathy, unspecified: Secondary | ICD-10-CM | POA: Diagnosis not present

## 2016-10-03 DIAGNOSIS — M109 Gout, unspecified: Secondary | ICD-10-CM | POA: Diagnosis not present

## 2016-10-03 DIAGNOSIS — Z96651 Presence of right artificial knee joint: Secondary | ICD-10-CM | POA: Diagnosis not present

## 2016-10-03 DIAGNOSIS — I10 Essential (primary) hypertension: Secondary | ICD-10-CM | POA: Diagnosis not present

## 2016-10-03 DIAGNOSIS — L97211 Non-pressure chronic ulcer of right calf limited to breakdown of skin: Secondary | ICD-10-CM | POA: Diagnosis not present

## 2016-10-03 DIAGNOSIS — E785 Hyperlipidemia, unspecified: Secondary | ICD-10-CM | POA: Diagnosis not present

## 2016-10-03 DIAGNOSIS — C679 Malignant neoplasm of bladder, unspecified: Secondary | ICD-10-CM | POA: Diagnosis not present

## 2016-10-03 DIAGNOSIS — E1140 Type 2 diabetes mellitus with diabetic neuropathy, unspecified: Secondary | ICD-10-CM | POA: Diagnosis not present

## 2016-10-03 DIAGNOSIS — Z794 Long term (current) use of insulin: Secondary | ICD-10-CM | POA: Diagnosis not present

## 2016-10-03 DIAGNOSIS — I872 Venous insufficiency (chronic) (peripheral): Secondary | ICD-10-CM | POA: Diagnosis not present

## 2016-10-06 DIAGNOSIS — I872 Venous insufficiency (chronic) (peripheral): Secondary | ICD-10-CM | POA: Diagnosis not present

## 2016-10-06 DIAGNOSIS — E785 Hyperlipidemia, unspecified: Secondary | ICD-10-CM | POA: Diagnosis not present

## 2016-10-06 DIAGNOSIS — L97211 Non-pressure chronic ulcer of right calf limited to breakdown of skin: Secondary | ICD-10-CM | POA: Diagnosis not present

## 2016-10-06 DIAGNOSIS — I10 Essential (primary) hypertension: Secondary | ICD-10-CM | POA: Diagnosis not present

## 2016-10-06 DIAGNOSIS — M109 Gout, unspecified: Secondary | ICD-10-CM | POA: Diagnosis not present

## 2016-10-06 DIAGNOSIS — Z794 Long term (current) use of insulin: Secondary | ICD-10-CM | POA: Diagnosis not present

## 2016-10-06 DIAGNOSIS — Z96651 Presence of right artificial knee joint: Secondary | ICD-10-CM | POA: Diagnosis not present

## 2016-10-06 DIAGNOSIS — E1140 Type 2 diabetes mellitus with diabetic neuropathy, unspecified: Secondary | ICD-10-CM | POA: Diagnosis not present

## 2016-10-06 DIAGNOSIS — C679 Malignant neoplasm of bladder, unspecified: Secondary | ICD-10-CM | POA: Diagnosis not present

## 2016-10-10 DIAGNOSIS — M109 Gout, unspecified: Secondary | ICD-10-CM | POA: Diagnosis not present

## 2016-10-10 DIAGNOSIS — E785 Hyperlipidemia, unspecified: Secondary | ICD-10-CM | POA: Diagnosis not present

## 2016-10-10 DIAGNOSIS — L97211 Non-pressure chronic ulcer of right calf limited to breakdown of skin: Secondary | ICD-10-CM | POA: Diagnosis not present

## 2016-10-10 DIAGNOSIS — Z794 Long term (current) use of insulin: Secondary | ICD-10-CM | POA: Diagnosis not present

## 2016-10-10 DIAGNOSIS — E1140 Type 2 diabetes mellitus with diabetic neuropathy, unspecified: Secondary | ICD-10-CM | POA: Diagnosis not present

## 2016-10-10 DIAGNOSIS — C679 Malignant neoplasm of bladder, unspecified: Secondary | ICD-10-CM | POA: Diagnosis not present

## 2016-10-10 DIAGNOSIS — I10 Essential (primary) hypertension: Secondary | ICD-10-CM | POA: Diagnosis not present

## 2016-10-10 DIAGNOSIS — Z96651 Presence of right artificial knee joint: Secondary | ICD-10-CM | POA: Diagnosis not present

## 2016-10-10 DIAGNOSIS — I872 Venous insufficiency (chronic) (peripheral): Secondary | ICD-10-CM | POA: Diagnosis not present

## 2016-10-12 DIAGNOSIS — D485 Neoplasm of uncertain behavior of skin: Secondary | ICD-10-CM | POA: Diagnosis not present

## 2016-10-12 DIAGNOSIS — L723 Sebaceous cyst: Secondary | ICD-10-CM | POA: Diagnosis not present

## 2016-10-12 DIAGNOSIS — L821 Other seborrheic keratosis: Secondary | ICD-10-CM | POA: Diagnosis not present

## 2016-10-17 DIAGNOSIS — M109 Gout, unspecified: Secondary | ICD-10-CM | POA: Diagnosis not present

## 2016-10-17 DIAGNOSIS — L97211 Non-pressure chronic ulcer of right calf limited to breakdown of skin: Secondary | ICD-10-CM | POA: Diagnosis not present

## 2016-10-17 DIAGNOSIS — Z96651 Presence of right artificial knee joint: Secondary | ICD-10-CM | POA: Diagnosis not present

## 2016-10-17 DIAGNOSIS — C679 Malignant neoplasm of bladder, unspecified: Secondary | ICD-10-CM | POA: Diagnosis not present

## 2016-10-17 DIAGNOSIS — I10 Essential (primary) hypertension: Secondary | ICD-10-CM | POA: Diagnosis not present

## 2016-10-17 DIAGNOSIS — I872 Venous insufficiency (chronic) (peripheral): Secondary | ICD-10-CM | POA: Diagnosis not present

## 2016-10-17 DIAGNOSIS — Z794 Long term (current) use of insulin: Secondary | ICD-10-CM | POA: Diagnosis not present

## 2016-10-17 DIAGNOSIS — E1140 Type 2 diabetes mellitus with diabetic neuropathy, unspecified: Secondary | ICD-10-CM | POA: Diagnosis not present

## 2016-10-17 DIAGNOSIS — E785 Hyperlipidemia, unspecified: Secondary | ICD-10-CM | POA: Diagnosis not present

## 2016-10-24 DIAGNOSIS — C679 Malignant neoplasm of bladder, unspecified: Secondary | ICD-10-CM | POA: Diagnosis not present

## 2016-10-24 DIAGNOSIS — I872 Venous insufficiency (chronic) (peripheral): Secondary | ICD-10-CM | POA: Diagnosis not present

## 2016-10-24 DIAGNOSIS — I10 Essential (primary) hypertension: Secondary | ICD-10-CM | POA: Diagnosis not present

## 2016-10-24 DIAGNOSIS — M109 Gout, unspecified: Secondary | ICD-10-CM | POA: Diagnosis not present

## 2016-10-24 DIAGNOSIS — L97211 Non-pressure chronic ulcer of right calf limited to breakdown of skin: Secondary | ICD-10-CM | POA: Diagnosis not present

## 2016-10-24 DIAGNOSIS — Z96651 Presence of right artificial knee joint: Secondary | ICD-10-CM | POA: Diagnosis not present

## 2016-10-24 DIAGNOSIS — Z794 Long term (current) use of insulin: Secondary | ICD-10-CM | POA: Diagnosis not present

## 2016-10-24 DIAGNOSIS — E1140 Type 2 diabetes mellitus with diabetic neuropathy, unspecified: Secondary | ICD-10-CM | POA: Diagnosis not present

## 2016-10-24 DIAGNOSIS — E785 Hyperlipidemia, unspecified: Secondary | ICD-10-CM | POA: Diagnosis not present

## 2016-10-31 DIAGNOSIS — C679 Malignant neoplasm of bladder, unspecified: Secondary | ICD-10-CM | POA: Diagnosis not present

## 2016-10-31 DIAGNOSIS — M109 Gout, unspecified: Secondary | ICD-10-CM | POA: Diagnosis not present

## 2016-10-31 DIAGNOSIS — I872 Venous insufficiency (chronic) (peripheral): Secondary | ICD-10-CM | POA: Diagnosis not present

## 2016-10-31 DIAGNOSIS — Z96651 Presence of right artificial knee joint: Secondary | ICD-10-CM | POA: Diagnosis not present

## 2016-10-31 DIAGNOSIS — L97211 Non-pressure chronic ulcer of right calf limited to breakdown of skin: Secondary | ICD-10-CM | POA: Diagnosis not present

## 2016-10-31 DIAGNOSIS — E785 Hyperlipidemia, unspecified: Secondary | ICD-10-CM | POA: Diagnosis not present

## 2016-10-31 DIAGNOSIS — E1140 Type 2 diabetes mellitus with diabetic neuropathy, unspecified: Secondary | ICD-10-CM | POA: Diagnosis not present

## 2016-10-31 DIAGNOSIS — Z794 Long term (current) use of insulin: Secondary | ICD-10-CM | POA: Diagnosis not present

## 2016-10-31 DIAGNOSIS — I10 Essential (primary) hypertension: Secondary | ICD-10-CM | POA: Diagnosis not present

## 2016-11-01 DIAGNOSIS — E048 Other specified nontoxic goiter: Secondary | ICD-10-CM | POA: Diagnosis not present

## 2016-11-01 DIAGNOSIS — I10 Essential (primary) hypertension: Secondary | ICD-10-CM | POA: Diagnosis not present

## 2016-11-01 DIAGNOSIS — N08 Glomerular disorders in diseases classified elsewhere: Secondary | ICD-10-CM | POA: Diagnosis not present

## 2016-11-01 DIAGNOSIS — E7849 Other hyperlipidemia: Secondary | ICD-10-CM | POA: Diagnosis not present

## 2016-11-01 DIAGNOSIS — M109 Gout, unspecified: Secondary | ICD-10-CM | POA: Diagnosis not present

## 2016-11-01 DIAGNOSIS — E11319 Type 2 diabetes mellitus with unspecified diabetic retinopathy without macular edema: Secondary | ICD-10-CM | POA: Diagnosis not present

## 2016-11-01 DIAGNOSIS — D649 Anemia, unspecified: Secondary | ICD-10-CM | POA: Diagnosis not present

## 2016-11-01 DIAGNOSIS — I872 Venous insufficiency (chronic) (peripheral): Secondary | ICD-10-CM | POA: Diagnosis not present

## 2016-11-01 DIAGNOSIS — E1129 Type 2 diabetes mellitus with other diabetic kidney complication: Secondary | ICD-10-CM | POA: Diagnosis not present

## 2016-11-24 DIAGNOSIS — R531 Weakness: Secondary | ICD-10-CM | POA: Diagnosis not present

## 2016-11-24 DIAGNOSIS — E872 Acidosis: Secondary | ICD-10-CM | POA: Diagnosis not present

## 2016-11-24 DIAGNOSIS — I5030 Unspecified diastolic (congestive) heart failure: Secondary | ICD-10-CM | POA: Diagnosis not present

## 2016-11-24 DIAGNOSIS — N189 Chronic kidney disease, unspecified: Secondary | ICD-10-CM | POA: Diagnosis not present

## 2016-11-24 DIAGNOSIS — R41841 Cognitive communication deficit: Secondary | ICD-10-CM | POA: Diagnosis not present

## 2016-11-24 DIAGNOSIS — R0902 Hypoxemia: Secondary | ICD-10-CM | POA: Diagnosis not present

## 2016-11-24 DIAGNOSIS — E119 Type 2 diabetes mellitus without complications: Secondary | ICD-10-CM | POA: Diagnosis not present

## 2016-11-24 DIAGNOSIS — N179 Acute kidney failure, unspecified: Secondary | ICD-10-CM | POA: Diagnosis not present

## 2016-11-24 DIAGNOSIS — M549 Dorsalgia, unspecified: Secondary | ICD-10-CM | POA: Diagnosis not present

## 2016-11-24 DIAGNOSIS — R2689 Other abnormalities of gait and mobility: Secondary | ICD-10-CM | POA: Diagnosis not present

## 2016-11-24 DIAGNOSIS — E875 Hyperkalemia: Secondary | ICD-10-CM | POA: Diagnosis not present

## 2016-11-24 DIAGNOSIS — S0990XA Unspecified injury of head, initial encounter: Secondary | ICD-10-CM | POA: Diagnosis not present

## 2016-11-24 DIAGNOSIS — R079 Chest pain, unspecified: Secondary | ICD-10-CM | POA: Diagnosis not present

## 2016-11-24 DIAGNOSIS — I10 Essential (primary) hypertension: Secondary | ICD-10-CM | POA: Diagnosis not present

## 2016-11-24 DIAGNOSIS — J189 Pneumonia, unspecified organism: Secondary | ICD-10-CM | POA: Diagnosis not present

## 2016-11-24 DIAGNOSIS — R1312 Dysphagia, oropharyngeal phase: Secondary | ICD-10-CM | POA: Diagnosis not present

## 2016-11-24 DIAGNOSIS — J9601 Acute respiratory failure with hypoxia: Secondary | ICD-10-CM | POA: Diagnosis not present

## 2016-11-24 DIAGNOSIS — N390 Urinary tract infection, site not specified: Secondary | ICD-10-CM | POA: Diagnosis not present

## 2016-11-24 DIAGNOSIS — J9690 Respiratory failure, unspecified, unspecified whether with hypoxia or hypercapnia: Secondary | ICD-10-CM | POA: Diagnosis not present

## 2016-11-24 DIAGNOSIS — R7881 Bacteremia: Secondary | ICD-10-CM | POA: Diagnosis not present

## 2016-11-24 DIAGNOSIS — I872 Venous insufficiency (chronic) (peripheral): Secondary | ICD-10-CM | POA: Diagnosis not present

## 2016-11-24 DIAGNOSIS — Z6841 Body Mass Index (BMI) 40.0 and over, adult: Secondary | ICD-10-CM | POA: Diagnosis not present

## 2016-11-24 DIAGNOSIS — I348 Other nonrheumatic mitral valve disorders: Secondary | ICD-10-CM | POA: Diagnosis not present

## 2016-11-24 DIAGNOSIS — B999 Unspecified infectious disease: Secondary | ICD-10-CM | POA: Diagnosis not present

## 2016-11-24 DIAGNOSIS — Z9981 Dependence on supplemental oxygen: Secondary | ICD-10-CM | POA: Diagnosis not present

## 2016-11-24 DIAGNOSIS — R601 Generalized edema: Secondary | ICD-10-CM | POA: Diagnosis not present

## 2016-11-24 DIAGNOSIS — G8911 Acute pain due to trauma: Secondary | ICD-10-CM | POA: Diagnosis not present

## 2016-11-24 DIAGNOSIS — E878 Other disorders of electrolyte and fluid balance, not elsewhere classified: Secondary | ICD-10-CM | POA: Diagnosis not present

## 2016-11-24 DIAGNOSIS — W01198A Fall on same level from slipping, tripping and stumbling with subsequent striking against other object, initial encounter: Secondary | ICD-10-CM | POA: Diagnosis not present

## 2016-11-24 DIAGNOSIS — E860 Dehydration: Secondary | ICD-10-CM | POA: Diagnosis not present

## 2016-11-24 DIAGNOSIS — Z9181 History of falling: Secondary | ICD-10-CM | POA: Diagnosis not present

## 2016-11-24 DIAGNOSIS — E785 Hyperlipidemia, unspecified: Secondary | ICD-10-CM | POA: Diagnosis not present

## 2016-11-24 DIAGNOSIS — R4182 Altered mental status, unspecified: Secondary | ICD-10-CM | POA: Diagnosis not present

## 2016-11-24 DIAGNOSIS — R918 Other nonspecific abnormal finding of lung field: Secondary | ICD-10-CM | POA: Diagnosis not present

## 2016-11-24 DIAGNOSIS — R072 Precordial pain: Secondary | ICD-10-CM | POA: Diagnosis not present

## 2016-11-24 DIAGNOSIS — R220 Localized swelling, mass and lump, head: Secondary | ICD-10-CM | POA: Diagnosis not present

## 2016-11-24 DIAGNOSIS — Y95 Nosocomial condition: Secondary | ICD-10-CM | POA: Diagnosis not present

## 2016-11-24 DIAGNOSIS — J440 Chronic obstructive pulmonary disease with acute lower respiratory infection: Secondary | ICD-10-CM | POA: Diagnosis not present

## 2016-11-24 DIAGNOSIS — R000 Tachycardia, unspecified: Secondary | ICD-10-CM | POA: Diagnosis not present

## 2016-11-24 DIAGNOSIS — R7989 Other specified abnormal findings of blood chemistry: Secondary | ICD-10-CM | POA: Diagnosis not present

## 2016-11-24 DIAGNOSIS — Y998 Other external cause status: Secondary | ICD-10-CM | POA: Diagnosis not present

## 2016-11-24 DIAGNOSIS — I130 Hypertensive heart and chronic kidney disease with heart failure and stage 1 through stage 4 chronic kidney disease, or unspecified chronic kidney disease: Secondary | ICD-10-CM | POA: Diagnosis not present

## 2016-11-24 DIAGNOSIS — E1165 Type 2 diabetes mellitus with hyperglycemia: Secondary | ICD-10-CM | POA: Diagnosis not present

## 2016-11-24 DIAGNOSIS — R05 Cough: Secondary | ICD-10-CM | POA: Diagnosis not present

## 2016-11-24 DIAGNOSIS — R9431 Abnormal electrocardiogram [ECG] [EKG]: Secondary | ICD-10-CM | POA: Diagnosis not present

## 2016-11-24 DIAGNOSIS — M6281 Muscle weakness (generalized): Secondary | ICD-10-CM | POA: Diagnosis not present

## 2016-12-03 DIAGNOSIS — R0902 Hypoxemia: Secondary | ICD-10-CM | POA: Diagnosis not present

## 2016-12-03 DIAGNOSIS — J189 Pneumonia, unspecified organism: Secondary | ICD-10-CM | POA: Diagnosis not present

## 2016-12-03 DIAGNOSIS — B999 Unspecified infectious disease: Secondary | ICD-10-CM | POA: Diagnosis not present

## 2016-12-03 DIAGNOSIS — D649 Anemia, unspecified: Secondary | ICD-10-CM | POA: Diagnosis not present

## 2016-12-03 DIAGNOSIS — N390 Urinary tract infection, site not specified: Secondary | ICD-10-CM | POA: Diagnosis not present

## 2016-12-03 DIAGNOSIS — Z9181 History of falling: Secondary | ICD-10-CM | POA: Diagnosis not present

## 2016-12-03 DIAGNOSIS — R7989 Other specified abnormal findings of blood chemistry: Secondary | ICD-10-CM | POA: Diagnosis not present

## 2016-12-03 DIAGNOSIS — N179 Acute kidney failure, unspecified: Secondary | ICD-10-CM | POA: Diagnosis not present

## 2016-12-03 DIAGNOSIS — D62 Acute posthemorrhagic anemia: Secondary | ICD-10-CM | POA: Diagnosis not present

## 2016-12-03 DIAGNOSIS — R531 Weakness: Secondary | ICD-10-CM | POA: Diagnosis not present

## 2016-12-03 DIAGNOSIS — M6281 Muscle weakness (generalized): Secondary | ICD-10-CM | POA: Diagnosis not present

## 2016-12-03 DIAGNOSIS — Y95 Nosocomial condition: Secondary | ICD-10-CM | POA: Diagnosis not present

## 2016-12-03 DIAGNOSIS — G8911 Acute pain due to trauma: Secondary | ICD-10-CM | POA: Diagnosis not present

## 2016-12-03 DIAGNOSIS — J9601 Acute respiratory failure with hypoxia: Secondary | ICD-10-CM | POA: Diagnosis not present

## 2016-12-03 DIAGNOSIS — Z9981 Dependence on supplemental oxygen: Secondary | ICD-10-CM | POA: Diagnosis not present

## 2016-12-03 DIAGNOSIS — E119 Type 2 diabetes mellitus without complications: Secondary | ICD-10-CM | POA: Diagnosis not present

## 2016-12-03 DIAGNOSIS — I872 Venous insufficiency (chronic) (peripheral): Secondary | ICD-10-CM | POA: Diagnosis not present

## 2016-12-03 DIAGNOSIS — R05 Cough: Secondary | ICD-10-CM | POA: Diagnosis not present

## 2016-12-03 DIAGNOSIS — R41841 Cognitive communication deficit: Secondary | ICD-10-CM | POA: Diagnosis not present

## 2016-12-03 DIAGNOSIS — R1312 Dysphagia, oropharyngeal phase: Secondary | ICD-10-CM | POA: Diagnosis not present

## 2016-12-03 DIAGNOSIS — E162 Hypoglycemia, unspecified: Secondary | ICD-10-CM | POA: Diagnosis not present

## 2016-12-03 DIAGNOSIS — Z79899 Other long term (current) drug therapy: Secondary | ICD-10-CM | POA: Diagnosis not present

## 2016-12-05 ENCOUNTER — Other Ambulatory Visit: Payer: Self-pay | Admitting: Pharmacist

## 2016-12-05 DIAGNOSIS — D62 Acute posthemorrhagic anemia: Secondary | ICD-10-CM | POA: Diagnosis not present

## 2016-12-05 DIAGNOSIS — J189 Pneumonia, unspecified organism: Secondary | ICD-10-CM | POA: Diagnosis not present

## 2016-12-05 DIAGNOSIS — M6281 Muscle weakness (generalized): Secondary | ICD-10-CM | POA: Diagnosis not present

## 2016-12-05 DIAGNOSIS — N179 Acute kidney failure, unspecified: Secondary | ICD-10-CM | POA: Diagnosis not present

## 2016-12-05 MED ORDER — NITROGLYCERIN 0.4 MG SL SUBL
0.40 mg | SUBLINGUAL_TABLET | SUBLINGUAL | Status: DC
Start: ? — End: 2016-12-05

## 2016-12-05 MED ORDER — GLUCOSE 40 % PO GEL
15.00 g | ORAL | Status: DC
Start: ? — End: 2016-12-05

## 2016-12-05 MED ORDER — INSULIN NPH ISOPHANE & REGULAR (70-30) 100 UNIT/ML ~~LOC~~ SUSP
15.00 | SUBCUTANEOUS | Status: DC
Start: 2016-12-03 — End: 2016-12-05

## 2016-12-05 MED ORDER — ALLOPURINOL 100 MG PO TABS
200.00 mg | ORAL_TABLET | ORAL | Status: DC
Start: 2016-12-04 — End: 2016-12-05

## 2016-12-05 MED ORDER — ACETAMINOPHEN 325 MG PO TABS
650.00 mg | ORAL_TABLET | ORAL | Status: DC
Start: ? — End: 2016-12-05

## 2016-12-05 MED ORDER — ALBUTEROL SULFATE (2.5 MG/3ML) 0.083% IN NEBU
2.50 mg | INHALATION_SOLUTION | RESPIRATORY_TRACT | Status: DC
Start: ? — End: 2016-12-05

## 2016-12-05 MED ORDER — HEPARIN SODIUM (PORCINE) 5000 UNIT/ML IJ SOLN
5000.00 | INTRAMUSCULAR | Status: DC
Start: 2016-12-03 — End: 2016-12-05

## 2016-12-05 MED ORDER — GENERIC EXTERNAL MEDICATION
1.00 mg | Status: DC
Start: ? — End: 2016-12-05

## 2016-12-05 MED ORDER — DEXTROSE 50 % IV SOLN
12.00 g | INTRAVENOUS | Status: DC
Start: ? — End: 2016-12-05

## 2016-12-05 MED ORDER — ATORVASTATIN CALCIUM 40 MG PO TABS
40.00 mg | ORAL_TABLET | ORAL | Status: DC
Start: 2016-12-03 — End: 2016-12-05

## 2016-12-05 MED ORDER — LORAZEPAM 2 MG/ML IJ SOLN
0.50 mg | INTRAMUSCULAR | Status: DC
Start: ? — End: 2016-12-05

## 2016-12-05 MED ORDER — LISINOPRIL 5 MG PO TABS
5.00 mg | ORAL_TABLET | ORAL | Status: DC
Start: 2016-12-04 — End: 2016-12-05

## 2016-12-05 MED ORDER — INSULIN LISPRO 100 UNIT/ML ~~LOC~~ SOLN
3.00 | SUBCUTANEOUS | Status: DC
Start: 2016-12-03 — End: 2016-12-05

## 2016-12-05 MED ORDER — POLYETHYLENE GLYCOL 3350 17 G PO PACK
17.00 g | PACK | ORAL | Status: DC
Start: 2016-12-04 — End: 2016-12-05

## 2016-12-05 MED ORDER — GUAIFENESIN 100 MG/5ML PO SYRP
200.00 mg | ORAL_SOLUTION | ORAL | Status: DC
Start: ? — End: 2016-12-05

## 2016-12-06 ENCOUNTER — Other Ambulatory Visit: Payer: Self-pay

## 2016-12-06 NOTE — Patient Outreach (Signed)
[facility]) Care Management  12/06/2016  Lauren Crosby Feb 22, 1931 [id number]  Transition of care  Referral date: 12/05/16 Referral source: discharged from inpatient admission from [facility] on 12/03/16 Insurance: [employer] Attempt #1  Telephone call to patient for transition of care follow up. Unable to reach patient. HIPAA compliant voice message left with call back phone number.   PLAN: RNCM will attempt 2nd telephone call to patient within 3 business days.   Quinn Plowman RN,BSN,CCM [facility] Telephonic  [phone]

## 2016-12-06 NOTE — Patient Outreach (Signed)
[place] [facility]) Care Management  [place]   12/06/2016  ELLARY CASAMENTO Jun 20, 1931 [id number]  Subjective:  Patient was called to conduct a post hospital medication reconciliation. Patient's husband answered the phone and said she was actually at [place].  Dustin Flock was called and the patient's medication list was reconciled via telephone with the Dustin Flock Charge Nurse.  Patient is an 83 year old female with multiple medical conditions including but not limited to:  type 2 diabetes, chronic urinary incontinence ,hypertension, hyperlipidemia, macular degeneration, morbid obesity, and  decreased mobility.  Patient was recently hospitalized for hypoxia possibly secondary to pneumonia as well as acute kidney injury.   Objective:   Encounter Medications: Outpatient Encounter Medications as of 12/05/2016  Medication Sig Note  . allopurinol (ZYLOPRIM) 300 MG tablet Take 300 mg by mouth daily.   Marland Kitchen docusate sodium (COLACE) 100 MG capsule Take 100 mg by mouth every 12 (twelve) hours.   . furosemide (LASIX) 20 MG tablet Take 20 mg by mouth daily as needed.   . insulin NPH-regular Human (NOVOLIN 70/30) (70-30) 100 UNIT/ML injection Inject 60-65 Units into the skin 2 (two) times daily with a meal. 60 UNITS TWICE DAILY 12/05/2016: 60 units BID verified by SNF nurse  . lisinopril (PRINIVIL,ZESTRIL) 5 MG tablet Take 5 mg by mouth daily.   . metFORMIN (GLUCOPHAGE) 1000 MG tablet Take 1,000 mg by mouth daily.  12/05/2016: QD dose verified by SNF nurse  . Multiple Vitamins-Minerals (PRESERVISION AREDS 2+MULTI VIT PO) Take 1 capsule by mouth daily.   . Polysaccharide Iron-FA-B12 (FERREX 150 FORTE PO) Take by mouth.   . simvastatin (ZOCOR) 80 MG tablet Take 80 mg by mouth at bedtime.   . [DISCONTINUED] losartan (COZAAR) 100 MG tablet Take 100 mg by mouth every evening.    . [DISCONTINUED] Multiple Vitamins-Minerals (PRESERVISION AREDS 2 PO) Take 1  tablet by mouth 2 (two) times daily.    No facility-administered encounter medications on file as of 12/05/2016.     Functional Status: In your present state of health, do you have any difficulty performing the following activities: 06/13/2016  Hearing? Y  Comment Bilateral. Doesnot use hearing aids  Vision? N  Difficulty concentrating or making decisions? Y  Walking or climbing stairs? Y  Dressing or bathing? N  Doing errands, shopping? N  Some recent data might be hidden    ASSESSMENT: Patient was recently discharged from hospital and all medications have been reviewed.  Date Discharged from Hospital: 12/03/2016 Date Medication Reconciliation Performed: 12/06/2016  Medications Discontinued at Discharge:  losartan  New Medications at Discharge:    Lisinopril  Continued Medications at Discharge: allopurinol (ZYLOPRIM) 300 MG tablet Take by mouth.   furosemide (LASIX) 20 MG tablet Take 20 mg by mouth daily.   insulin NPH-insulin regular (HUMULIN 70/30, NOVOLIN 70/30) 100 unit/mL (70-30) injection Inject 60-65 Units into the skin 2 times daily with meals.   iron ps complex/B12/folic acid (FERREX 191 FORTE ORAL) Take by mouth.   metFORMIN (GLUCOPHAGE) 1000 MG tablet Take by mouth.   simvastatin (ZOCOR) 80 MG tablet Take by mouth.   vit C,E-Zn-coppr-lutein-zeaxan (PRESERVISION AREDS-2) 250-200-40-1 mg-unit-mg-mg Cap Take 1 tablet by mouth.   Patient was recently discharged from hospital and all medications have been reviewed   Drugs sorted by system:  Cardiovascular: Furosemide Lisinopril Simvastatin  Gastrointestinal: Docusate Sodium  Endocrine: Novolin 70/30 Metformin  Renal: Allopurinol  Vitamins/Minerals: Ferrex 150 Preservision    PLAN: Close patient's case.  Seba Dalkai is available to follow up with the patient if necessary after discharge from SNF.

## 2016-12-07 DIAGNOSIS — J9601 Acute respiratory failure with hypoxia: Secondary | ICD-10-CM | POA: Diagnosis not present

## 2016-12-07 DIAGNOSIS — J189 Pneumonia, unspecified organism: Secondary | ICD-10-CM | POA: Diagnosis not present

## 2016-12-07 DIAGNOSIS — E119 Type 2 diabetes mellitus without complications: Secondary | ICD-10-CM | POA: Diagnosis not present

## 2016-12-07 DIAGNOSIS — M6281 Muscle weakness (generalized): Secondary | ICD-10-CM | POA: Diagnosis not present

## 2016-12-08 ENCOUNTER — Other Ambulatory Visit: Payer: Self-pay

## 2016-12-08 ENCOUNTER — Encounter: Payer: Self-pay | Admitting: *Deleted

## 2016-12-08 NOTE — Patient Outreach (Signed)
[facility]) Care Management  1April 01, 202018  AVERILL WINTERS 22-Mar-1931 [id number]   Received notification from Denyse Amass that she spoke with patients husband and patient is currently in [facility] .  RNCM requested Alwyn Ren make referral to [facility] social worker to follow up with patient while at skilled nursing facility.   PLAN: RNCM will notify case Primary school teacher of patients currently status in skilled nursing facility. No further follow up by [facility] at this time.   Quinn Plowman RN,BSN,CCM [place] Telephonic  [phone]

## 2016-12-08 NOTE — Addendum Note (Signed)
Addended by: Elayne Guerin on: [id number] 12:23 PM   Modules accepted: Orders

## 2016-12-11 DIAGNOSIS — J189 Pneumonia, unspecified organism: Secondary | ICD-10-CM | POA: Diagnosis not present

## 2016-12-11 DIAGNOSIS — E119 Type 2 diabetes mellitus without complications: Secondary | ICD-10-CM | POA: Diagnosis not present

## 2016-12-11 DIAGNOSIS — E162 Hypoglycemia, unspecified: Secondary | ICD-10-CM | POA: Diagnosis not present

## 2016-12-11 DIAGNOSIS — J9601 Acute respiratory failure with hypoxia: Secondary | ICD-10-CM | POA: Diagnosis not present

## 2016-12-21 ENCOUNTER — Encounter: Payer: Self-pay | Admitting: *Deleted

## 2016-12-21 ENCOUNTER — Other Ambulatory Visit: Payer: Self-pay | Admitting: *Deleted

## 2016-12-21 DIAGNOSIS — E11649 Type 2 diabetes mellitus with hypoglycemia without coma: Secondary | ICD-10-CM | POA: Diagnosis not present

## 2016-12-21 DIAGNOSIS — I10 Essential (primary) hypertension: Secondary | ICD-10-CM | POA: Diagnosis not present

## 2016-12-21 DIAGNOSIS — M6281 Muscle weakness (generalized): Secondary | ICD-10-CM | POA: Diagnosis not present

## 2016-12-21 DIAGNOSIS — J9601 Acute respiratory failure with hypoxia: Secondary | ICD-10-CM | POA: Diagnosis not present

## 2016-12-21 DIAGNOSIS — D509 Iron deficiency anemia, unspecified: Secondary | ICD-10-CM | POA: Diagnosis not present

## 2016-12-21 DIAGNOSIS — E1129 Type 2 diabetes mellitus with other diabetic kidney complication: Secondary | ICD-10-CM | POA: Diagnosis not present

## 2016-12-21 DIAGNOSIS — H3530 Unspecified macular degeneration: Secondary | ICD-10-CM | POA: Diagnosis not present

## 2016-12-21 DIAGNOSIS — Z6841 Body Mass Index (BMI) 40.0 and over, adult: Secondary | ICD-10-CM | POA: Diagnosis not present

## 2016-12-21 DIAGNOSIS — R42 Dizziness and giddiness: Secondary | ICD-10-CM | POA: Diagnosis not present

## 2016-12-21 DIAGNOSIS — Z9981 Dependence on supplemental oxygen: Secondary | ICD-10-CM | POA: Diagnosis not present

## 2016-12-21 DIAGNOSIS — I509 Heart failure, unspecified: Secondary | ICD-10-CM | POA: Diagnosis not present

## 2016-12-21 DIAGNOSIS — M47816 Spondylosis without myelopathy or radiculopathy, lumbar region: Secondary | ICD-10-CM | POA: Diagnosis not present

## 2016-12-21 DIAGNOSIS — F0390 Unspecified dementia without behavioral disturbance: Secondary | ICD-10-CM | POA: Diagnosis not present

## 2016-12-21 DIAGNOSIS — N179 Acute kidney failure, unspecified: Secondary | ICD-10-CM | POA: Diagnosis not present

## 2016-12-21 DIAGNOSIS — I872 Venous insufficiency (chronic) (peripheral): Secondary | ICD-10-CM | POA: Diagnosis not present

## 2016-12-21 NOTE — Patient Outreach (Signed)
[facility]) Care Management  12/21/2016  Lauren Crosby 09-29-1931 [id number]   CSW drove out to [facility] [place] where patient was residing to receive short-term rehabilitative services, only to find that patient had been discharged home on December 16, 2016.  CSW was able to make initial contact with patient today to perform phone assessment, as well as assess and assist with social work needs and services.  CSW introduced self, explained role and types of services provided through [facility] ([facility]).  CSW further explained to patient that CSW works with patient's Pharmacist, also with [facility], Denyse Amass. CSW then explained the reason for the call, indicating that Mrs. Lauren Crosby thought that patient would benefit from social work services and resources to assist with discharge planning needs and services from the skilled nursing facility.  CSW obtained two HIPAA compliant identifiers from patient, which included patient's name and date of birth. Patient admitted that she left the skilled nursing facility early, not feeling as though she needed to stay to receive therapy services, both physical and occupational.  Patient reported that home health services were arranged for her at time of discharge.  CSW explained to patient that CSW has made a referral for nursing services so that patient can receive disease management services in the community.  Patient voiced understanding and was agreeable to this plan.  Patient indicated that she has a very supportive family and that her husband, Lauren Crosby is able to assist with activities of daily living.  Patient was unable to identify any social work specific needs at present, but has CSW's contact information and has been encouraged to contact CSW directly if social work need arise in the near future. CSW will perform a case closure on patient,  as all goals of treatment have been met from social work standpoint and no additional social work needs have been identified at this time.  CSW will notify patient's Pharmacist with [facility], Denyse Amass of CSW's plans to close patient's case.  CSW will fax an update to patient's Primary Care Physician, Dr. [name] to ensure that they are aware of CSW's involvement with patient's plan of care.  CSW will submit a case closure request to Alycia Rossetti, Care Management Assistant with [facility], in the form of an [employer].  CSW will ensure that [name] is aware of [name], RNCM with [facility], continued involvement with patient's care. Nat Christen, BSW, MSW, LCSW  Licensed Education officer, [facility]  Mailing [place] [address], [place], [place] [address] Physical Address-[address], [place], [place] [address] Toll Free Main # [phone] Fax # [phone] Cell # [phone]  Office # [phone] [email or web address]

## 2016-12-22 ENCOUNTER — Telehealth: Payer: Self-pay | Admitting: *Deleted

## 2016-12-22 DIAGNOSIS — I872 Venous insufficiency (chronic) (peripheral): Secondary | ICD-10-CM | POA: Diagnosis not present

## 2016-12-22 DIAGNOSIS — M6281 Muscle weakness (generalized): Secondary | ICD-10-CM | POA: Diagnosis not present

## 2016-12-22 DIAGNOSIS — I10 Essential (primary) hypertension: Secondary | ICD-10-CM | POA: Diagnosis not present

## 2016-12-22 DIAGNOSIS — J9601 Acute respiratory failure with hypoxia: Secondary | ICD-10-CM | POA: Diagnosis not present

## 2016-12-22 DIAGNOSIS — E11649 Type 2 diabetes mellitus with hypoglycemia without coma: Secondary | ICD-10-CM | POA: Diagnosis not present

## 2016-12-22 DIAGNOSIS — M47816 Spondylosis without myelopathy or radiculopathy, lumbar region: Secondary | ICD-10-CM | POA: Diagnosis not present

## 2016-12-22 DIAGNOSIS — H3530 Unspecified macular degeneration: Secondary | ICD-10-CM | POA: Diagnosis not present

## 2016-12-22 DIAGNOSIS — F0390 Unspecified dementia without behavioral disturbance: Secondary | ICD-10-CM | POA: Diagnosis not present

## 2016-12-22 NOTE — Telephone Encounter (Signed)
NOTES SENT TO SCHEDULING.  °

## 2016-12-25 ENCOUNTER — Telehealth: Payer: Self-pay | Admitting: Pharmacist

## 2016-12-25 DIAGNOSIS — M47816 Spondylosis without myelopathy or radiculopathy, lumbar region: Secondary | ICD-10-CM | POA: Diagnosis not present

## 2016-12-25 DIAGNOSIS — J9601 Acute respiratory failure with hypoxia: Secondary | ICD-10-CM | POA: Diagnosis not present

## 2016-12-25 DIAGNOSIS — M6281 Muscle weakness (generalized): Secondary | ICD-10-CM | POA: Diagnosis not present

## 2016-12-25 DIAGNOSIS — F0390 Unspecified dementia without behavioral disturbance: Secondary | ICD-10-CM | POA: Diagnosis not present

## 2016-12-25 DIAGNOSIS — H3530 Unspecified macular degeneration: Secondary | ICD-10-CM | POA: Diagnosis not present

## 2016-12-25 DIAGNOSIS — I10 Essential (primary) hypertension: Secondary | ICD-10-CM | POA: Diagnosis not present

## 2016-12-25 DIAGNOSIS — I872 Venous insufficiency (chronic) (peripheral): Secondary | ICD-10-CM | POA: Diagnosis not present

## 2016-12-25 DIAGNOSIS — E11649 Type 2 diabetes mellitus with hypoglycemia without coma: Secondary | ICD-10-CM | POA: Diagnosis not present

## 2016-12-25 NOTE — Patient Outreach (Signed)
[facility]) Care Management  [place]   12/25/2016  KAISLEE CHAO 1931/10/09 [id number]  Subjective: Patient was called to review her medications post discharge from the SNF. Her husband answered the phone and is on her emergency contact info. HIPAA identifiers were obtained.  Patient is an 83 year old female with multiple medical conditions including but not limited to:  type 2 diabetes, chronic urinary incontinence ,hypertension, hyperlipidemia, macular degeneration, morbid obesity, and  decreased mobility.  Patient was recently hospitalized for hypoxia possibly secondary to pneumonia as well as acute kidney injury.  After being hospitalized, the patient went to [facility] for [facility]hab and has been discharged.    Patient and her husband manage her medications.  Patient's husband reported they did not have any questions about her medications.  Objective:   Encounter Medications: Outpatient Encounter Medications as of 12/25/2016  Medication Sig  . allopurinol (ZYLOPRIM) 300 MG tablet Take 300 mg by mouth daily.  Marland Kitchen docusate sodium (COLACE) 100 MG capsule Take 100 mg by mouth every 12 (twelve) hours.  . furosemide (LASIX) 20 MG tablet Take 20 mg by mouth daily as needed.  . insulin NPH-regular Human (NOVOLIN 70/30) (70-30) 100 UNIT/ML injection 45 units in the am and 50 units at supper  . iron polysaccharides (NIFEREX) 150 MG capsule Take 150 mg by mouth daily.  Marland Kitchen lisinopril (PRINIVIL,ZESTRIL) 5 MG tablet Take 5 mg by mouth daily.  . metFORMIN (GLUCOPHAGE) 1000 MG tablet Take 1,000 mg by mouth 2 (two) times daily with a meal.   . Multiple Vitamins-Minerals (PRESERVISION AREDS 2+MULTI VIT PO) Take 1 capsule by mouth daily.  . simvastatin (ZOCOR) 80 MG tablet Take 40 mg by mouth at bedtime.   . [DISCONTINUED] Polysaccharide Iron-FA-B12 (FERREX 150 FORTE PO) Take by mouth.   No facility-administered encounter medications on file as of 12/25/2016.      Functional Status: In your present state of health, do you have any difficulty performing the following activities: 12/21/2016 06/13/2016  Hearing? N Y  Comment - Bilateral. Doesnot use hearing aids  Vision? Y N  Difficulty concentrating or making decisions? Tempie Donning  Walking or climbing stairs? Y Y  Dressing or bathing? N N  Doing errands, shopping? Y N  Preparing Food and eating ? N -  Using the Toilet? N -  In the past six months, have you accidently leaked urine? Y -  Do you have problems with loss of bowel control? N -  Managing your Medications? N -  Managing your Finances? N -  Housekeeping or managing your Housekeeping? Y -  Some recent data might be hidden    Fall/Depression Screening: Fall Risk  12/21/2016  Falls in the past year? Yes  Number falls in past yr: 1  Injury with Fall? No  Risk for fall due to : History of fall(s);Impaired balance/gait;Impaired mobility  Follow up Education provided;Falls prevention discussed   PHQ 2/9 Scores 12/21/2016  PHQ - 2 Score 0      Assessment: Patient was recently discharged from hospital and all medications have been reviewed. (12/05/16 )   Medications reviewed via telephone with the patient's husband.   Cardiovascular: Furosemide Lisinopril Simvastatin  Gastrointestinal: Docusate Sodium  Endocrine: Novolin 70/30 Metformin  Renal: Allopurinol  Vitamins/Minerals: Nu-Iron 150 Preservision  Medication Review Findings:  Metformin 1000 mg- listed on medical chart as 1 tablet daily--patient taking 1 tablet twice daily (dose was changed on the medication list)  70/30 insulin-medication list dose was  65 units twice daily. Patient is only injecting 45 units in the AM and 50 units at supper (dose was changed on the medication list)  Plan: Route note to the patient's PCP. Close patient's pharmacy case. [facility] Social [facility] have already closed th patient's case. Closure letters will be sent. CMA  will be notified of closure.  Elayne Guerin, PharmD, [place] Clinical Pharmacist [phone]

## 2016-12-26 DIAGNOSIS — E11649 Type 2 diabetes mellitus with hypoglycemia without coma: Secondary | ICD-10-CM | POA: Diagnosis not present

## 2016-12-26 DIAGNOSIS — F0390 Unspecified dementia without behavioral disturbance: Secondary | ICD-10-CM | POA: Diagnosis not present

## 2016-12-26 DIAGNOSIS — H3530 Unspecified macular degeneration: Secondary | ICD-10-CM | POA: Diagnosis not present

## 2016-12-26 DIAGNOSIS — I10 Essential (primary) hypertension: Secondary | ICD-10-CM | POA: Diagnosis not present

## 2016-12-26 DIAGNOSIS — I872 Venous insufficiency (chronic) (peripheral): Secondary | ICD-10-CM | POA: Diagnosis not present

## 2016-12-26 DIAGNOSIS — J9601 Acute respiratory failure with hypoxia: Secondary | ICD-10-CM | POA: Diagnosis not present

## 2016-12-26 DIAGNOSIS — M47816 Spondylosis without myelopathy or radiculopathy, lumbar region: Secondary | ICD-10-CM | POA: Diagnosis not present

## 2016-12-26 DIAGNOSIS — M6281 Muscle weakness (generalized): Secondary | ICD-10-CM | POA: Diagnosis not present

## 2016-12-27 DIAGNOSIS — J9601 Acute respiratory failure with hypoxia: Secondary | ICD-10-CM | POA: Diagnosis not present

## 2016-12-27 DIAGNOSIS — M47816 Spondylosis without myelopathy or radiculopathy, lumbar region: Secondary | ICD-10-CM | POA: Diagnosis not present

## 2016-12-27 DIAGNOSIS — M6281 Muscle weakness (generalized): Secondary | ICD-10-CM | POA: Diagnosis not present

## 2016-12-27 DIAGNOSIS — H3530 Unspecified macular degeneration: Secondary | ICD-10-CM | POA: Diagnosis not present

## 2016-12-27 DIAGNOSIS — I10 Essential (primary) hypertension: Secondary | ICD-10-CM | POA: Diagnosis not present

## 2016-12-27 DIAGNOSIS — I872 Venous insufficiency (chronic) (peripheral): Secondary | ICD-10-CM | POA: Diagnosis not present

## 2016-12-27 DIAGNOSIS — E11649 Type 2 diabetes mellitus with hypoglycemia without coma: Secondary | ICD-10-CM | POA: Diagnosis not present

## 2016-12-27 DIAGNOSIS — F0390 Unspecified dementia without behavioral disturbance: Secondary | ICD-10-CM | POA: Diagnosis not present

## 2016-12-28 DIAGNOSIS — I10 Essential (primary) hypertension: Secondary | ICD-10-CM | POA: Diagnosis not present

## 2016-12-28 DIAGNOSIS — M47816 Spondylosis without myelopathy or radiculopathy, lumbar region: Secondary | ICD-10-CM | POA: Diagnosis not present

## 2016-12-28 DIAGNOSIS — F0390 Unspecified dementia without behavioral disturbance: Secondary | ICD-10-CM | POA: Diagnosis not present

## 2016-12-28 DIAGNOSIS — H3530 Unspecified macular degeneration: Secondary | ICD-10-CM | POA: Diagnosis not present

## 2016-12-28 DIAGNOSIS — I872 Venous insufficiency (chronic) (peripheral): Secondary | ICD-10-CM | POA: Diagnosis not present

## 2016-12-28 DIAGNOSIS — J9601 Acute respiratory failure with hypoxia: Secondary | ICD-10-CM | POA: Diagnosis not present

## 2016-12-28 DIAGNOSIS — M6281 Muscle weakness (generalized): Secondary | ICD-10-CM | POA: Diagnosis not present

## 2016-12-28 DIAGNOSIS — E11649 Type 2 diabetes mellitus with hypoglycemia without coma: Secondary | ICD-10-CM | POA: Diagnosis not present

## 2016-12-29 DIAGNOSIS — H3530 Unspecified macular degeneration: Secondary | ICD-10-CM | POA: Diagnosis not present

## 2016-12-29 DIAGNOSIS — F0390 Unspecified dementia without behavioral disturbance: Secondary | ICD-10-CM | POA: Diagnosis not present

## 2016-12-29 DIAGNOSIS — M47816 Spondylosis without myelopathy or radiculopathy, lumbar region: Secondary | ICD-10-CM | POA: Diagnosis not present

## 2016-12-29 DIAGNOSIS — J9601 Acute respiratory failure with hypoxia: Secondary | ICD-10-CM | POA: Diagnosis not present

## 2016-12-29 DIAGNOSIS — I872 Venous insufficiency (chronic) (peripheral): Secondary | ICD-10-CM | POA: Diagnosis not present

## 2016-12-29 DIAGNOSIS — E11649 Type 2 diabetes mellitus with hypoglycemia without coma: Secondary | ICD-10-CM | POA: Diagnosis not present

## 2016-12-29 DIAGNOSIS — M6281 Muscle weakness (generalized): Secondary | ICD-10-CM | POA: Diagnosis not present

## 2016-12-29 DIAGNOSIS — I10 Essential (primary) hypertension: Secondary | ICD-10-CM | POA: Diagnosis not present

## 2017-01-03 DIAGNOSIS — E669 Obesity, unspecified: Secondary | ICD-10-CM | POA: Insufficient documentation

## 2017-01-03 DIAGNOSIS — E785 Hyperlipidemia, unspecified: Secondary | ICD-10-CM | POA: Insufficient documentation

## 2017-01-03 DIAGNOSIS — K759 Inflammatory liver disease, unspecified: Secondary | ICD-10-CM | POA: Insufficient documentation

## 2017-01-03 DIAGNOSIS — I872 Venous insufficiency (chronic) (peripheral): Secondary | ICD-10-CM | POA: Diagnosis not present

## 2017-01-03 DIAGNOSIS — M6281 Muscle weakness (generalized): Secondary | ICD-10-CM | POA: Diagnosis not present

## 2017-01-03 DIAGNOSIS — I10 Essential (primary) hypertension: Secondary | ICD-10-CM | POA: Diagnosis not present

## 2017-01-03 DIAGNOSIS — K219 Gastro-esophageal reflux disease without esophagitis: Secondary | ICD-10-CM | POA: Insufficient documentation

## 2017-01-03 DIAGNOSIS — E13319 Other specified diabetes mellitus with unspecified diabetic retinopathy without macular edema: Secondary | ICD-10-CM | POA: Insufficient documentation

## 2017-01-03 DIAGNOSIS — M109 Gout, unspecified: Secondary | ICD-10-CM | POA: Insufficient documentation

## 2017-01-03 DIAGNOSIS — D3500 Benign neoplasm of unspecified adrenal gland: Secondary | ICD-10-CM | POA: Insufficient documentation

## 2017-01-03 DIAGNOSIS — J189 Pneumonia, unspecified organism: Secondary | ICD-10-CM | POA: Insufficient documentation

## 2017-01-03 DIAGNOSIS — M47816 Spondylosis without myelopathy or radiculopathy, lumbar region: Secondary | ICD-10-CM | POA: Diagnosis not present

## 2017-01-03 DIAGNOSIS — E11649 Type 2 diabetes mellitus with hypoglycemia without coma: Secondary | ICD-10-CM | POA: Diagnosis not present

## 2017-01-03 DIAGNOSIS — L02219 Cutaneous abscess of trunk, unspecified: Secondary | ICD-10-CM | POA: Insufficient documentation

## 2017-01-03 DIAGNOSIS — N200 Calculus of kidney: Secondary | ICD-10-CM | POA: Insufficient documentation

## 2017-01-03 DIAGNOSIS — F0390 Unspecified dementia without behavioral disturbance: Secondary | ICD-10-CM | POA: Diagnosis not present

## 2017-01-03 DIAGNOSIS — J9601 Acute respiratory failure with hypoxia: Secondary | ICD-10-CM | POA: Diagnosis not present

## 2017-01-03 DIAGNOSIS — E1321 Other specified diabetes mellitus with diabetic nephropathy: Secondary | ICD-10-CM | POA: Insufficient documentation

## 2017-01-03 DIAGNOSIS — R0602 Shortness of breath: Secondary | ICD-10-CM | POA: Insufficient documentation

## 2017-01-03 DIAGNOSIS — E049 Nontoxic goiter, unspecified: Secondary | ICD-10-CM | POA: Insufficient documentation

## 2017-01-03 DIAGNOSIS — L03319 Cellulitis of trunk, unspecified: Secondary | ICD-10-CM

## 2017-01-03 DIAGNOSIS — H3530 Unspecified macular degeneration: Secondary | ICD-10-CM | POA: Diagnosis not present

## 2017-01-03 DIAGNOSIS — K5792 Diverticulitis of intestine, part unspecified, without perforation or abscess without bleeding: Secondary | ICD-10-CM | POA: Insufficient documentation

## 2017-01-03 DIAGNOSIS — E119 Type 2 diabetes mellitus without complications: Secondary | ICD-10-CM | POA: Insufficient documentation

## 2017-01-03 DIAGNOSIS — D649 Anemia, unspecified: Secondary | ICD-10-CM | POA: Insufficient documentation

## 2017-01-03 DIAGNOSIS — K635 Polyp of colon: Secondary | ICD-10-CM | POA: Insufficient documentation

## 2017-01-03 DIAGNOSIS — M1990 Unspecified osteoarthritis, unspecified site: Secondary | ICD-10-CM | POA: Insufficient documentation

## 2017-01-08 DIAGNOSIS — I872 Venous insufficiency (chronic) (peripheral): Secondary | ICD-10-CM | POA: Diagnosis not present

## 2017-01-08 DIAGNOSIS — H3530 Unspecified macular degeneration: Secondary | ICD-10-CM | POA: Diagnosis not present

## 2017-01-08 DIAGNOSIS — M6281 Muscle weakness (generalized): Secondary | ICD-10-CM | POA: Diagnosis not present

## 2017-01-08 DIAGNOSIS — F0390 Unspecified dementia without behavioral disturbance: Secondary | ICD-10-CM | POA: Diagnosis not present

## 2017-01-08 DIAGNOSIS — M47816 Spondylosis without myelopathy or radiculopathy, lumbar region: Secondary | ICD-10-CM | POA: Diagnosis not present

## 2017-01-08 DIAGNOSIS — E11649 Type 2 diabetes mellitus with hypoglycemia without coma: Secondary | ICD-10-CM | POA: Diagnosis not present

## 2017-01-08 DIAGNOSIS — J9601 Acute respiratory failure with hypoxia: Secondary | ICD-10-CM | POA: Diagnosis not present

## 2017-01-08 DIAGNOSIS — I10 Essential (primary) hypertension: Secondary | ICD-10-CM | POA: Diagnosis not present

## 2017-01-09 DIAGNOSIS — M6281 Muscle weakness (generalized): Secondary | ICD-10-CM | POA: Diagnosis not present

## 2017-01-09 DIAGNOSIS — H3530 Unspecified macular degeneration: Secondary | ICD-10-CM | POA: Diagnosis not present

## 2017-01-09 DIAGNOSIS — J9601 Acute respiratory failure with hypoxia: Secondary | ICD-10-CM | POA: Diagnosis not present

## 2017-01-09 DIAGNOSIS — I872 Venous insufficiency (chronic) (peripheral): Secondary | ICD-10-CM | POA: Diagnosis not present

## 2017-01-09 DIAGNOSIS — E11649 Type 2 diabetes mellitus with hypoglycemia without coma: Secondary | ICD-10-CM | POA: Diagnosis not present

## 2017-01-09 DIAGNOSIS — I10 Essential (primary) hypertension: Secondary | ICD-10-CM | POA: Diagnosis not present

## 2017-01-09 DIAGNOSIS — M47816 Spondylosis without myelopathy or radiculopathy, lumbar region: Secondary | ICD-10-CM | POA: Diagnosis not present

## 2017-01-09 DIAGNOSIS — F0390 Unspecified dementia without behavioral disturbance: Secondary | ICD-10-CM | POA: Diagnosis not present

## 2017-01-10 DIAGNOSIS — E11649 Type 2 diabetes mellitus with hypoglycemia without coma: Secondary | ICD-10-CM | POA: Diagnosis not present

## 2017-01-10 DIAGNOSIS — I10 Essential (primary) hypertension: Secondary | ICD-10-CM | POA: Diagnosis not present

## 2017-01-10 DIAGNOSIS — I872 Venous insufficiency (chronic) (peripheral): Secondary | ICD-10-CM | POA: Diagnosis not present

## 2017-01-10 DIAGNOSIS — Z8551 Personal history of malignant neoplasm of bladder: Secondary | ICD-10-CM | POA: Diagnosis not present

## 2017-01-10 DIAGNOSIS — F0390 Unspecified dementia without behavioral disturbance: Secondary | ICD-10-CM | POA: Diagnosis not present

## 2017-01-10 DIAGNOSIS — J9601 Acute respiratory failure with hypoxia: Secondary | ICD-10-CM | POA: Diagnosis not present

## 2017-01-10 DIAGNOSIS — M6281 Muscle weakness (generalized): Secondary | ICD-10-CM | POA: Diagnosis not present

## 2017-01-10 DIAGNOSIS — H3530 Unspecified macular degeneration: Secondary | ICD-10-CM | POA: Diagnosis not present

## 2017-01-10 DIAGNOSIS — M47816 Spondylosis without myelopathy or radiculopathy, lumbar region: Secondary | ICD-10-CM | POA: Diagnosis not present

## 2017-01-11 DIAGNOSIS — I872 Venous insufficiency (chronic) (peripheral): Secondary | ICD-10-CM | POA: Diagnosis not present

## 2017-01-11 DIAGNOSIS — E11649 Type 2 diabetes mellitus with hypoglycemia without coma: Secondary | ICD-10-CM | POA: Diagnosis not present

## 2017-01-11 DIAGNOSIS — M47816 Spondylosis without myelopathy or radiculopathy, lumbar region: Secondary | ICD-10-CM | POA: Diagnosis not present

## 2017-01-11 DIAGNOSIS — H3530 Unspecified macular degeneration: Secondary | ICD-10-CM | POA: Diagnosis not present

## 2017-01-11 DIAGNOSIS — M6281 Muscle weakness (generalized): Secondary | ICD-10-CM | POA: Diagnosis not present

## 2017-01-11 DIAGNOSIS — F0390 Unspecified dementia without behavioral disturbance: Secondary | ICD-10-CM | POA: Diagnosis not present

## 2017-01-11 DIAGNOSIS — I10 Essential (primary) hypertension: Secondary | ICD-10-CM | POA: Diagnosis not present

## 2017-01-11 DIAGNOSIS — J9601 Acute respiratory failure with hypoxia: Secondary | ICD-10-CM | POA: Diagnosis not present

## 2017-01-12 ENCOUNTER — Inpatient Hospital Stay (HOSPITAL_COMMUNITY)
Admission: EM | Admit: 2017-01-12 | Discharge: 2017-01-15 | DRG: 194 | Disposition: A | Discharge status: Home Health | Payer: Medicare HMO | Attending: Nephrology | Admitting: Nephrology

## 2017-01-12 ENCOUNTER — Emergency Department (HOSPITAL_COMMUNITY): Payer: Medicare HMO

## 2017-01-12 ENCOUNTER — Other Ambulatory Visit: Payer: Self-pay

## 2017-01-12 ENCOUNTER — Encounter (HOSPITAL_COMMUNITY): Payer: Self-pay

## 2017-01-12 DIAGNOSIS — R0902 Hypoxemia: Secondary | ICD-10-CM | POA: Diagnosis present

## 2017-01-12 DIAGNOSIS — I10 Essential (primary) hypertension: Secondary | ICD-10-CM | POA: Diagnosis not present

## 2017-01-12 DIAGNOSIS — Z96651 Presence of right artificial knee joint: Secondary | ICD-10-CM | POA: Diagnosis present

## 2017-01-12 DIAGNOSIS — M109 Gout, unspecified: Secondary | ICD-10-CM | POA: Diagnosis present

## 2017-01-12 DIAGNOSIS — K219 Gastro-esophageal reflux disease without esophagitis: Secondary | ICD-10-CM | POA: Diagnosis present

## 2017-01-12 DIAGNOSIS — I872 Venous insufficiency (chronic) (peripheral): Secondary | ICD-10-CM | POA: Diagnosis present

## 2017-01-12 DIAGNOSIS — E11319 Type 2 diabetes mellitus with unspecified diabetic retinopathy without macular edema: Secondary | ICD-10-CM | POA: Diagnosis present

## 2017-01-12 DIAGNOSIS — B9789 Other viral agents as the cause of diseases classified elsewhere: Secondary | ICD-10-CM | POA: Diagnosis not present

## 2017-01-12 DIAGNOSIS — J9601 Acute respiratory failure with hypoxia: Secondary | ICD-10-CM | POA: Diagnosis not present

## 2017-01-12 DIAGNOSIS — E1165 Type 2 diabetes mellitus with hyperglycemia: Secondary | ICD-10-CM | POA: Diagnosis not present

## 2017-01-12 DIAGNOSIS — J189 Pneumonia, unspecified organism: Secondary | ICD-10-CM

## 2017-01-12 DIAGNOSIS — Z8701 Personal history of pneumonia (recurrent): Secondary | ICD-10-CM

## 2017-01-12 DIAGNOSIS — H3530 Unspecified macular degeneration: Secondary | ICD-10-CM | POA: Diagnosis not present

## 2017-01-12 DIAGNOSIS — M6281 Muscle weakness (generalized): Secondary | ICD-10-CM | POA: Diagnosis not present

## 2017-01-12 DIAGNOSIS — M47816 Spondylosis without myelopathy or radiculopathy, lumbar region: Secondary | ICD-10-CM | POA: Diagnosis not present

## 2017-01-12 DIAGNOSIS — R062 Wheezing: Secondary | ICD-10-CM

## 2017-01-12 DIAGNOSIS — N179 Acute kidney failure, unspecified: Secondary | ICD-10-CM

## 2017-01-12 DIAGNOSIS — R0602 Shortness of breath: Secondary | ICD-10-CM | POA: Diagnosis not present

## 2017-01-12 DIAGNOSIS — E118 Type 2 diabetes mellitus with unspecified complications: Secondary | ICD-10-CM

## 2017-01-12 DIAGNOSIS — Z794 Long term (current) use of insulin: Secondary | ICD-10-CM | POA: Diagnosis not present

## 2017-01-12 DIAGNOSIS — B348 Other viral infections of unspecified site: Secondary | ICD-10-CM

## 2017-01-12 DIAGNOSIS — E785 Hyperlipidemia, unspecified: Secondary | ICD-10-CM | POA: Diagnosis present

## 2017-01-12 DIAGNOSIS — J181 Lobar pneumonia, unspecified organism: Secondary | ICD-10-CM | POA: Diagnosis not present

## 2017-01-12 DIAGNOSIS — E1140 Type 2 diabetes mellitus with diabetic neuropathy, unspecified: Secondary | ICD-10-CM | POA: Diagnosis present

## 2017-01-12 DIAGNOSIS — F0390 Unspecified dementia without behavioral disturbance: Secondary | ICD-10-CM | POA: Diagnosis not present

## 2017-01-12 DIAGNOSIS — Z87442 Personal history of urinary calculi: Secondary | ICD-10-CM

## 2017-01-12 DIAGNOSIS — R05 Cough: Secondary | ICD-10-CM | POA: Diagnosis not present

## 2017-01-12 DIAGNOSIS — D509 Iron deficiency anemia, unspecified: Secondary | ICD-10-CM | POA: Diagnosis present

## 2017-01-12 DIAGNOSIS — E119 Type 2 diabetes mellitus without complications: Secondary | ICD-10-CM

## 2017-01-12 DIAGNOSIS — D649 Anemia, unspecified: Secondary | ICD-10-CM | POA: Diagnosis not present

## 2017-01-12 DIAGNOSIS — E11649 Type 2 diabetes mellitus with hypoglycemia without coma: Secondary | ICD-10-CM | POA: Diagnosis not present

## 2017-01-12 DIAGNOSIS — J1289 Other viral pneumonia: Secondary | ICD-10-CM | POA: Diagnosis not present

## 2017-01-12 LAB — BASIC METABOLIC PANEL
ANION GAP: 9 (ref 5–15)
BUN: 60 mg/dL — ABNORMAL HIGH (ref 6–20)
CO2: 24 mmol/L (ref 22–32)
Calcium: 9.1 mg/dL (ref 8.9–10.3)
Chloride: 108 mmol/L (ref 101–111)
Creatinine, Ser: 1.45 mg/dL — ABNORMAL HIGH (ref 0.44–1.00)
GFR calc Af Amer: 37 mL/min — ABNORMAL LOW (ref >60)
GFR, EST NON AFRICAN AMERICAN: 32 mL/min — AB (ref >60)
Glucose, Bld: 135 mg/dL — ABNORMAL HIGH (ref 65–99)
Potassium: 5.1 mmol/L (ref 3.5–5.1)
SODIUM: 141 mmol/L (ref 135–145)

## 2017-01-12 LAB — CBC WITH DIFFERENTIAL/PLATELET
BASOS PCT: 0 %
Basophils Absolute: 0 10*3/uL (ref 0.0–0.1)
EOS ABS: 0.1 10*3/uL (ref 0.0–0.7)
Eosinophils Relative: 2 %
HCT: 31.1 % — ABNORMAL LOW (ref 36.0–46.0)
Hemoglobin: 9.9 g/dL — ABNORMAL LOW (ref 12.0–15.0)
LYMPHS PCT: 37 %
Lymphs Abs: 2.7 10*3/uL (ref 0.7–4.0)
MCH: 20.9 pg — ABNORMAL LOW (ref 26.0–34.0)
MCHC: 31.8 g/dL (ref 30.0–36.0)
MCV: 65.6 fL — ABNORMAL LOW (ref 78.0–100.0)
Monocytes Absolute: 0.4 10*3/uL (ref 0.1–1.0)
Monocytes Relative: 6 %
NEUTROS PCT: 55 %
Neutro Abs: 4 10*3/uL (ref 1.7–7.7)
PLATELETS: 171 10*3/uL (ref 150–400)
RBC: 4.74 MIL/uL (ref 3.87–5.11)
RDW: 20.9 % — ABNORMAL HIGH (ref 11.5–15.5)
WBC: 7.2 10*3/uL (ref 4.0–10.5)

## 2017-01-12 LAB — I-STAT TROPONIN, ED: TROPONIN I, POC: 0.01 ng/mL (ref 0.00–0.08)

## 2017-01-12 LAB — BRAIN NATRIURETIC PEPTIDE: B NATRIURETIC PEPTIDE 5: 83.7 pg/mL (ref 0.0–100.0)

## 2017-01-12 MED ORDER — IPRATROPIUM BROMIDE 0.02 % IN SOLN
0.5000 mg | Freq: Once | RESPIRATORY_TRACT | Status: AC
  Administered 2017-01-12: 0.5 mg via RESPIRATORY_TRACT
  Filled 2017-01-12: qty 2.5

## 2017-01-12 MED ORDER — VANCOMYCIN HCL IN DEXTROSE 1-5 GM/200ML-% IV SOLN
1000.0000 mg | Freq: Once | INTRAVENOUS | Status: AC
  Administered 2017-01-12: 1000 mg via INTRAVENOUS
  Filled 2017-01-12: qty 200

## 2017-01-12 MED ORDER — ALBUTEROL SULFATE (2.5 MG/3ML) 0.083% IN NEBU
5.0000 mg | INHALATION_SOLUTION | Freq: Once | RESPIRATORY_TRACT | Status: AC
  Administered 2017-01-12: 5 mg via RESPIRATORY_TRACT
  Filled 2017-01-12: qty 6

## 2017-01-12 MED ORDER — DEXTROSE 5 % IV SOLN
2.0000 g | Freq: Once | INTRAVENOUS | Status: AC
  Administered 2017-01-12: 2 g via INTRAVENOUS
  Filled 2017-01-12: qty 2

## 2017-01-12 MED ORDER — METHYLPREDNISOLONE SODIUM SUCC 125 MG IJ SOLR
80.0000 mg | Freq: Once | INTRAMUSCULAR | Status: AC
  Administered 2017-01-12: 80 mg via INTRAVENOUS
  Filled 2017-01-12: qty 2

## 2017-01-12 NOTE — ED Provider Notes (Signed)
[facility] DEPT Provider Note   CSN: [id number] Arrival date & time: 01/12/17  1520     History   Chief Complaint Chief Complaint  Patient presents with  . Cough  . Wheezing    HPI Lauren Crosby is a 84 y.o. female with a PMHx of adrenal adenoma, chronic anemia, DM2, HTN, HLD, chronic dyspnea, chronic venous stasis, decreased mobility, and other conditions listed below, who presents to the ED with complaints of cough and wheezing x 1 week.  Patient states that she was admitted at [facility] health for pneumonia in November, went to rehab briefly until 12/10/16, and had been doing well until 1 week ago when she developed a cough and wheezing.  Her cough is productive of white sputum.  Her PCP called her in azithromycin which she has been taking for 4 days, but this has not been helping.  She has been using Robitussin, cough drops, and albuterol inhaler with minimal relief of symptoms.  No known aggravating factors.  Her symptoms continue to worsen so she came in for evaluation because her home health nurse noticed that she was wheezing quite a bit at home.  She has no known sick contacts recently.  Chart review reveals that her admission at [facility] was for hypoxia due to pneumonia, as well as AKI, she was admitted from 11/16-25/18, PNA was treated with levaquin and vancomycin.  She has no known hx of asthma/COPD, and she is a nonsmoker.  It's unclear if she has CHF or not, pt mentions it but she had a TTE at [facility] which reports EF 55-60% with normal LV function.    She states she has chronic intermittent L sided chest pain that she describes as 8/10 intermittent stabbing nonradiating L anterior chest wall pain which worsens with "touching the area" and with no tx tried; she reports she has no current CP, and that this intermittent CP has been going on for "years" without any acute changes.  She states she's going to be going to see a cardiologist on 01/22/17.     She also has chronic BLE swelling due to venous insufficiency which she reports is stable and at baseline; reports the home health nurse came today and put a bandage on her RLE because it was weeping today.    She denies diaphoresis, lightheadedness, fevers, chills, URI symptoms, hemoptysis, new/worsening/ongoing CP, new/worsening/acute SOB, new/worsening LE swelling, abd pain, N/V/D/C, hematuria, dysuria, myalgias, arthralgias, new/worsening numbness/tingling, focal weakness, or any other complaints at this time.  Her PCP is Dr. [name].   Of note, regarding her PCN allergy; pt's husband states that "many years ago" she had champagne and "almost died", and when her doctor tested her, they told her she was allergic to PCN and could never get PCN. She has not actually received PCN to her knowledge, therefore doesn't know if she has this same reaction to PCN or just to champagne.    The history is provided by medical records and the patient. No language interpreter was used.  Cough  This is a new problem. The current episode started more than 2 days ago. The problem occurs constantly. The problem has been gradually worsening. The cough is productive of sputum. There has been no fever. Associated symptoms include wheezing. Pertinent negatives include no chest pain (none new/worsening from chronic), no chills, no rhinorrhea, no sore throat, no myalgias and no shortness of breath. Treatments tried: robitussin, cough drops, albuterol inhaler, and azithromycin. The treatment provided mild  relief. She is not a smoker. Her past medical history does not include COPD or asthma.  Wheezing   Associated symptoms include cough. Pertinent negatives include no chest pain (none new/worsening from chronic), no fever, no abdominal pain, no vomiting, no diarrhea, no dysuria, no rhinorrhea and no sore throat. Her past medical history does not include asthma or COPD.    Past Medical History:  Diagnosis Date  . Adrenal  adenoma   . Anemia   . Arthritis    OA AND PAIN BOTH KNEES BUT RIGHT KNEE PAIN WORSE; SOME LOWER BACK PAIN  . Cellulitis and abscess of trunk   . Colon polyp    adenomatous  . Diabetes ([place])    type 2  PT IS ON ORAL MEDICATION AND INSULIN  . Diverticulitis   . GERD (gastroesophageal reflux disease)    NO MEDS FOR GERD  . Goiter   . Gout   . Hepatitis    YELLOW JAUNDICE WHEN A CHILD  . Hernia   . Hyperlipemia   . Hypertension   . Nephrolithiasis   . 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 Obesity   . Open wound of abdominal wall, anterior, without mention of complication   . Pneumonia    IN THE PAST  . Retinopathy due to secondary diabetes mellitus ([place])   . Shortness of breath    PT IS NOT ABLE TO BE ACTIVE BECAUSE OF KNEE PROBLEM; HAS ALWAYS HAD PROBLEM WITH BREATHING HEAVY - DOES GET SOB WITH EXERTION.  Marland Kitchen Thalassemia minor     Patient Active Problem List   Diagnosis Date Noted  . Shortness of breath   . Retinopathy due to secondary diabetes mellitus ([place])   . Pneumonia   . Obesity   . 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 Nephrolithiasis   . Hypertension   . Hyperlipemia   . Hepatitis   . Gout   . Goiter   . GERD (gastroesophageal reflux disease)   . Diverticulitis   . Diabetes ([place])   . Colon polyp   . Cellulitis and abscess of trunk   . Arthritis   . Anemia   . Adrenal adenoma   . Morbid obesity ([place]) 08/13/2013  . Postoperative anemia due to acute blood loss 08/13/2013  . S/P right TKA 08/11/2013  . Preop cardiovascular exam 08/14/2012  . Chest pain 08/14/2012  . Suture granuloma 02/09/2012  . DYSPNEA ON EXERTION 10/04/2009  . DIABETES MELLITUS, TYPE II 04/15/2007  . HYPERLIPIDEMIA 04/15/2007  . OBESITY 04/15/2007  . ANEMIA, CHRONIC 04/15/2007  . ANXIETY 04/15/2007  . DEPRESSION 04/15/2007  . HYPERTENSION 04/15/2007  . GERD 04/15/2007  . DEGENERATIVE JOINT DISEASE 04/15/2007  . HELICOBACTER PYLORI INFECTION,  HX OF 04/15/2007  . COLONIC POLYPS 10/04/2006  . DIVERTICULOSIS, COLON 10/04/2006  . REFLUX ESOPHAGITIS 09-15-2001  . GASTRITIS, ACUTE 09-15-2001    Past Surgical History:  Procedure Laterality Date  . APPENDECTOMY     1993 PT HAD APPENDECTOMY AND ABDOMINAL SURGERY FRO DIVERTICULITS  . CARDIAC CATHETERIZATION  2008   conclusion: smooth and normal coronary arteries, normal left ventricular systolic function  . CHOLECYSTECTOMY    . COLONOSCOPY  10/04/06  . CYSTOSCOPY WITH BIOPSY N/A 06/16/2016   Procedure: CYSTOSCOPY WITH  BLADDER BIOPSY AND FULGERATION 1CM;  Surgeon: Festus Aloe, MD;  Location: [facility] ORS;  Service: Urology;  Laterality: N/A;  . DIAGNOSTIC MAMMOGRAM  03/17/2011  . ROTATOR CUFF REPAIR Left   . SIGMOID RESECTION /  RECTOPEXY    . TONSILLECTOMY    . TOTAL ABDOMINAL HYSTERECTOMY    . TOTAL KNEE ARTHROPLASTY Right 08/11/2013   Procedure: RIGHT TOTAL KNEE ARTHROPLASTY;  Surgeon: Mauri Pole, MD;  Location: [facility] ORS;  Service: Orthopedics;  Laterality: Right;  . VENTRAL HERNIA REPAIR  2007    OB History    Gravida Para Term Preterm AB Living   7 6 6   1 6    SAB TAB Ectopic Multiple Live Births   1       6       Home Medications    Prior to Admission medications   Medication Sig Start Date End Date Taking? Authorizing Provider  allopurinol (ZYLOPRIM) 300 MG tablet Take 300 mg by mouth daily.    [provider]  docusate sodium (COLACE) 100 MG capsule Take 100 mg by mouth every 12 (twelve) hours.    [provider]  furosemide (LASIX) 20 MG tablet Take 20 mg by mouth daily as needed. 11/01/16   [provider]  insulin NPH-regular Human (NOVOLIN 70/30) (70-30) 100 UNIT/ML injection 45 units in the am and 50 units at supper    [provider]  iron polysaccharides (NIFEREX) 150 MG capsule Take 150 mg by mouth daily.    [provider]  metFORMIN (GLUCOPHAGE) 1000 MG tablet Take 1,000 mg by mouth 2 (two) times daily with a  meal.     [provider]  Multiple Vitamins-Minerals (PRESERVISION AREDS 2+MULTI VIT PO) Take 1 capsule by mouth daily.    [provider]  simvastatin (ZOCOR) 80 MG tablet Take 40 mg by mouth at bedtime.     [provider]    Family History Family History  Problem Relation Age of Onset  . Diabetes Father   . Hypertension Father   . Kidney disease Father   . Liver disease Mother   . Hypertension Unknown   . Rheum arthritis Sister   . Diverticulitis Sister   . Hypertension Sister   . Colon cancer Neg Hx   . Colon polyps Neg Hx   . Esophageal cancer Neg Hx   . Pancreatic cancer Neg Hx   . Stomach cancer Neg Hx     Social History Social History   Tobacco Use  . Smoking status: Never Smoker  . Smokeless tobacco: Never Used  Substance Use Topics  . Alcohol use: No    Alcohol/week: 0.0 oz  . Drug use: No     Allergies   Penicillins   Review of Systems Review of Systems  Constitutional: Negative for chills, diaphoresis and fever.  HENT: Negative for rhinorrhea and sore throat.   Respiratory: Positive for cough and wheezing. Negative for shortness of breath.        No hemoptysis  Cardiovascular: Negative for chest pain (none new/worsening from chronic) and leg swelling (nothing new/worse from baseline).  Gastrointestinal: Negative for abdominal pain, constipation, diarrhea, nausea and vomiting.  Genitourinary: Negative for dysuria and hematuria.  Musculoskeletal: Negative for arthralgias and myalgias.  Skin: Negative for color change.  Allergic/Immunologic: Positive for immunocompromised state (DM2).  Neurological: Negative for weakness, light-headedness and numbness.  Psychiatric/Behavioral: Negative for confusion.   All other systems reviewed and are negative for acute change except as noted in the HPI.    Physical Exam Updated Vital Signs BP (!) 151/57 (BP Location: Left Arm)   Pulse 93   Temp 97.8 F (36.6 C) (Oral)   Resp (!) 22    Ht  5\' 2"  (1.575 m)   Wt 110.7 kg (244 lb)   SpO2 100%   BMI 44.63 kg/m   Physical Exam  Constitutional: She is oriented to person, place, and time. Vital signs are normal. She appears well-developed and well-nourished.  Non-toxic appearance. No distress.  Afebrile, nontoxic, NAD, obese caucasian female, very hard of hearing  HENT:  Head: Normocephalic and atraumatic.  Right Ear: Decreased hearing is noted.  Left Ear: Decreased hearing is noted.  Mouth/Throat: Oropharynx is clear and moist and mucous membranes are normal.  Hard of hearing at baseline  Eyes: Conjunctivae and EOM are normal. Right eye exhibits no discharge. Left eye exhibits no discharge.  Neck: Normal range of motion. Neck supple.  Cardiovascular: Normal rate, regular rhythm, normal heart sounds and intact distal pulses. Exam reveals no gallop and no friction rub.  No murmur heard. RRR, nl s1/s2, no m/r/g, distal pulses intact, mild chronic venous stasis edema bilaterally which pt states is chronic and unchanged; compression stocking on LLE and coban dressing on RLE  Pulmonary/Chest: Tachypnea noted. No respiratory distress. She has no decreased breath sounds. She has wheezes. She has rhonchi in the left lower field. She has no rales. She exhibits tenderness. She exhibits no crepitus, no deformity and no retraction.  Slightly tachypneic with mildly increased WOB, speaking in slightly fragmented sentences. Diffuse expiratory wheezing throughout, with somewhat scattered rhonchi however most noticeably in the LLF, no rales, no hypoxia, SpO2 90-100% on RA Chest wall with mild L anterior TTP, without crepitus, deformities, or retractions     Abdominal: Soft. Normal appearance and bowel sounds are normal. She exhibits no distension. There is no tenderness. There is no rigidity, no rebound, no guarding, no CVA tenderness, no tenderness at McBurney's point and negative Murphy's sign.  Musculoskeletal: Normal range of motion.    Neurological: She is alert and oriented to person, place, and time. She has normal strength. No sensory deficit.  Skin: Skin is warm, dry and intact. No rash noted.  Psychiatric: She has a normal mood and affect.  Nursing note and vitals reviewed.    ED Treatments / Results  Labs (all labs ordered are listed, but only abnormal results are displayed) Labs Reviewed  CBC WITH DIFFERENTIAL/PLATELET - Abnormal; Notable for the following components:      Result Value   Hemoglobin 9.9 (*)    HCT 31.1 (*)    MCV 65.6 (*)    MCH 20.9 (*)    RDW 20.9 (*)    All other components within normal limits  BASIC METABOLIC PANEL - Abnormal; Notable for the following components:   Glucose, Bld 135 (*)    BUN 60 (*)    Creatinine, Ser 1.45 (*)    GFR calc non Af Amer 32 (*)    GFR calc Af Amer 37 (*)    All other components within normal limits  BRAIN NATRIURETIC PEPTIDE  I-STAT TROPONIN, ED    EKG  EKG Interpretation  Date/Time:  Friday January 12 2017 20:25:56 EST Ventricular Rate:  88 PR Interval:    QRS Duration: 101 QT Interval:  386 QTC Calculation: 467 R Axis:   13 Text Interpretation:  Sinus rhythm Low voltage, precordial leads Consider anterior infarct Confirmed by Quintella Reichert [phone]) on 01/12/2017 9:21:14 PM       Radiology Dg Chest 2 View  Result Date: 01/12/2017 CLINICAL DATA:  Productive cough and wheezing for the past couple of days. Chronic shortness of breath. EXAM: CHEST  2 VIEW  COMPARISON:  11/25/2016 FINDINGS: The cardiac silhouette remains mildly enlarged. Pulmonary vascular congestion on the prior study has decreased. There is mild streaky opacity in the retrocardiac left lower lobe. No overt edema, pleural effusion, or pneumothorax is identified. No acute osseous abnormality is seen. IMPRESSION: 1. Cardiomegaly with decreased pulmonary vascular congestion. 2. Mild streaky left basilar opacity which may reflect atelectasis or early pneumonia. Electronically Signed    By: Logan Bores M.D.   On: 01/12/2017 18:47    11/2016 TTE (per notes from admission at [facility]): showing EF 55-60%, normal left ventricular size and systolic function without any appreciable segmental abnormality   Echo 08/22/12: Study Conclusions Left ventricle: The cavity size was normal. Wall thickness was normal. Systolic function was normal. The estimated ejection fraction was in the range of 60% to 65%. Wall motion was normal; there were no regional wall motion abnormalities. There was an increased relative contribution of atrial contraction to ventricular filling.   Procedures Procedures (including critical care time)  Medications Ordered in ED Medications  aztreonam (AZACTAM) 2 g in dextrose 5 % 50 mL IVPB (not administered)  albuterol (PROVENTIL) (2.5 MG/3ML) 0.083% nebulizer solution 5 mg (not administered)  ipratropium (ATROVENT) nebulizer solution 0.5 mg (not administered)  albuterol (PROVENTIL) (2.5 MG/3ML) 0.083% nebulizer solution 5 mg (5 mg Nebulization Given 01/12/17 2042)  ipratropium (ATROVENT) nebulizer solution 0.5 mg (0.5 mg Nebulization Given 01/12/17 2042)  methylPREDNISolone sodium succinate (SOLU-MEDROL) 125 mg/2 mL injection 80 mg (80 mg Intravenous Given 01/12/17 2057)  vancomycin (VANCOCIN) IVPB 1000 mg/200 mL premix (0 mg Intravenous Stopped 01/12/17 2206)     Initial Impression / Assessment and Plan / ED Course  I have reviewed the triage vital signs and the nursing notes.  Pertinent labs & imaging results that were available during my care of the patient were reviewed by me and considered in my medical decision making (see chart for details).     84 y.o. female here with cough and wheezing x1wk, recently had PNA and was admitted at [facility] on 11/24/16, went to rehab until 12/10/16 and then went home. When symptoms started, her PCP started her on azithromycin and she has completed 4 days of it, however hasn't improved. On exam, diffuse expiratory wheezing,  scattered rhonchi most prominently in the LLL, slightly tachypneic but no hypoxia; chronic b/l venous stasis edema which pt states is at baseline. No tachycardia. Mild tenderness over L anterior chest wall. CXR shows improving vascular congestion, stable cardiomegaly, and mild streaky left basilar opacity which likely reflects early PNA. Since pt has been admitted in the last 3 months, this classifies as HCAP. Pt denies any changes in her chronic SOB, and denies any acute changes in her CP (states she's had intermittent L sided CP for years); doubt PE, doubt this is CHF exacerbation, more likely HCAP given her symptoms. Will give duoneb, solumedrol, and get labs, and empiric HCAP abx. She will need admission for HCAP and failure of outpatient tx. Discussed case with my attending Dr. Ralene Bathe who agrees with plan.   9:49 PM CBC w/diff with chronic stable microcytic anemia. BMP with very mild AKI (BUN 60, Cr 1.45). Trop neg. EKG with low voltage but otherwise no acute ischemic findings, unchanged from prior. BNP pending. Lung sounds slightly improved after duoneb but still fairly wheezy throughout; less tachypneic and appears to be working less to breathe; will repeat duoneb and await BNP prior to admission.   10:10 PM BNP WNL. Will proceed with admission for HCAP.  10:18 PM Dr. Maudie Mercury of [facility] returning page and will admit. Holding orders to be placed by admitting team. Please see their notes for further documentation of care. I appreciate their help with this pleasant pt's care. Pt stable at time of admission.    Final Clinical Impressions(s) / ED Diagnoses   Final diagnoses:  HCAP (healthcare-associated pneumonia)  Chronic anemia  AKI (acute kidney injury) [facility])  Wheezing    ED Discharge Orders    [address] [place], [place] 01/12/17 2219    Quintella Reichert, MD 01/16/17 1827

## 2017-01-12 NOTE — ED Triage Notes (Signed)
Patient c/o  A productive cough with intermittent white sputum and wheezing. Patient states, "I'm always SOB."

## 2017-01-12 NOTE — Progress Notes (Signed)
A consult was received from an ED physician for Vancomycin per pharmacy dosing.  The patient's profile has been reviewed for ht/wt/allergies/indication/available labs.   A one time order has been placed for Vancomycin 1gm iv x1.  Further antibiotics/pharmacy consults should be ordered by admitting physician if indicated.                       Thank you, Nani Skillern Crowford 01/12/2017  8:26 PM

## 2017-01-12 NOTE — H&P (Signed)
TRH H&P   Patient Demographics:    Lauren Crosby, is a 84 y.o. female  MRN: [id number]   DOB - 01-Jan-1932  Admit Date - 01/12/2017  Outpatient Primary MD for the patient is [name], MD  Referring MD/NP/PA:  [place]  Outpatient Specialists:  [name]  Patient coming from: home  Chief Complaint  Patient presents with  . Cough  . Wheezing      HPI:    Lauren Crosby  is a 84 y.o. female, w Thallasemia, hypertension, hyperlipidemia, dm2, diabetic neuropathy, apparently c/o cough for the past several days (white sputum),  And increasing dyspnea today.  She was recently admitted to [facility] for pneumonia about 4 weeks ago.  Pt presented to ED due to wheezing and dyspnea.    In ED,    CXR   IMPRESSION: 1. Cardiomegaly with decreased pulmonary vascular congestion. 2. Mild streaky left basilar opacity which may reflect atelectasis or early pneumonia.  Wbc 7.2 Hgb 9.9, plt 171 Na 141, K 5.1, Bun 60, Creatinine 1.45  BNP 83.7  Trop 0.01  Pt will be admitted for dyspnea secondary to Hcap.      Review of systems:    In addition to the HPI above, No Fever-chills, No Headache, No changes with Vision or hearing, No problems swallowing food or Liquids, No Chest pain No Abdominal pain, No Nausea or Vommitting, Bowel movements are regular, No Blood in stool or Urine, No dysuria, No new skin rashes or bruises, No new joints pains-aches,  No new weakness, tingling, numbness in any extremity, No recent weight gain or loss, No polyuria, polydypsia or polyphagia, No significant Mental Stressors.  A full 10 point Review of Systems was done, except as stated above, all other Review of Systems were negative.   With Past History of the following :    Past Medical History:  Diagnosis Date  . Adrenal adenoma   . Anemia   . Arthritis      OA AND PAIN BOTH KNEES BUT RIGHT KNEE PAIN WORSE; SOME LOWER BACK PAIN  . Cellulitis and abscess of trunk   . Colon polyp    adenomatous  . Diabetes ([place])    type 2  PT IS ON ORAL MEDICATION AND INSULIN  . Diverticulitis   . GERD (gastroesophageal reflux disease)    NO MEDS FOR GERD  . Goiter   . Gout   . Hepatitis    YELLOW JAUNDICE WHEN A CHILD  . Hernia   . Hyperlipemia   . Hypertension   . Nephrolithiasis   . 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 Obesity   . Open wound of abdominal wall, anterior, without mention of complication   . Pneumonia    IN THE PAST  . Retinopathy due to secondary diabetes mellitus ([place])   . Shortness of breath  PT IS NOT ABLE TO BE ACTIVE BECAUSE OF KNEE PROBLEM; HAS ALWAYS HAD PROBLEM WITH BREATHING HEAVY - DOES GET SOB WITH EXERTION.  Marland Kitchen Thalassemia minor       Past Surgical History:  Procedure Laterality Date  . APPENDECTOMY     1993 PT HAD APPENDECTOMY AND ABDOMINAL SURGERY FRO DIVERTICULITS  . CARDIAC CATHETERIZATION  2008   conclusion: smooth and normal coronary arteries, normal left ventricular systolic function  . CHOLECYSTECTOMY    . COLONOSCOPY  10/04/06  . CYSTOSCOPY WITH BIOPSY N/A 06/16/2016   Procedure: CYSTOSCOPY WITH  BLADDER BIOPSY AND FULGERATION 1CM;  Surgeon: Festus Aloe, MD;  Location: [facility];  Service: Urology;  Laterality: N/A;  . DIAGNOSTIC MAMMOGRAM  03/17/2011  . ROTATOR CUFF REPAIR Left   . SIGMOID RESECTION / RECTOPEXY    . TONSILLECTOMY    . TOTAL ABDOMINAL HYSTERECTOMY    . TOTAL KNEE ARTHROPLASTY Right 08/11/2013   Procedure: RIGHT TOTAL KNEE ARTHROPLASTY;  Surgeon: Mauri Pole, MD;  Location: [facility];  Service: Orthopedics;  Laterality: Right;  . VENTRAL HERNIA REPAIR  2007      Social History:     Social History   Tobacco Use  . Smoking status: Never Smoker  . Smokeless tobacco: Never Used  Substance Use Topics  . Alcohol use: No    Alcohol/week: 0.0 oz     Lives - at  home w husband  Mobility - walks by self   Family History :     Family History  Problem Relation Age of Onset  . Diabetes Father   . Hypertension Father   . Kidney disease Father   . Liver disease Mother   . Hypertension Unknown   . Rheum arthritis Sister   . Diverticulitis Sister   . Hypertension Sister   . Colon cancer Neg Hx   . Colon polyps Neg Hx   . Esophageal cancer Neg Hx   . Pancreatic cancer Neg Hx   . Stomach cancer Neg Hx       Home Medications:   Prior to Admission medications   Medication Sig Start Date End Date Taking? Authorizing Provider  allopurinol (ZYLOPRIM) 300 MG tablet Take 300 mg by mouth daily.   Yes [provider]  docusate sodium (COLACE) 100 MG capsule Take 100 mg by mouth every 12 (twelve) hours.   Yes [provider]  esomeprazole (NEXIUM) 40 MG capsule Take 40 mg by mouth daily. 01/05/17  Yes [provider]  furosemide (LASIX) 20 MG tablet Take 20-60 mg by mouth daily as needed. 60mg  in the morning, 20 mg in the evening 11/01/16  Yes [provider]  insulin NPH-regular Human (NOVOLIN 70/30) (70-30) 100 UNIT/ML injection 45 units in the am and 50 units at supper   Yes [provider]  iron polysaccharides (NIFEREX) 150 MG capsule Take 150 mg by mouth daily.   Yes [provider]  lisinopril (PRINIVIL,ZESTRIL) 5 MG tablet Take 5 mg by mouth daily. 01/05/17  Yes [provider]  metFORMIN (GLUCOPHAGE) 1000 MG tablet Take 1,000 mg by mouth 2 (two) times daily with a meal.    Yes [provider]  Multiple Vitamins-Minerals (PRESERVISION AREDS 2+MULTI VIT PO) Take 1 capsule by mouth daily.   Yes [provider]  simvastatin (ZOCOR) 80 MG tablet Take 40 mg by mouth at bedtime.    Yes [provider]  VENTOLIN HFA 108 (90 Base) MCG/ACT inhaler Take 2 puffs by mouth every 4 (four)  hours as needed. for wheezing 01/08/17  Yes [provider]      Allergies:     Allergies  Allergen Reactions  . Penicillins Other (See Comments)    Has patient had a PCN reaction causing immediate rash, facial/tongue/throat swelling, SOB or lightheadedness with hypotension: No Has patient had a PCN reaction causing severe rash involving mucus membranes or skin necrosis: No Has patient had a PCN reaction that required hospitalization: No Has patient had a PCN reaction occurring within the last 10 years: Unknown If all of the above answers are "NO", then may proceed with Cephalosporin use.     Physical Exam:   Vitals  Blood pressure (!) 151/57, pulse 96, temperature 97.8 F (36.6 C), temperature source Oral, resp. rate 19, height 5\' 2"  (1.575 m), weight 110.7 kg (244 lb), SpO2 100 %.   1. General  lying in bed in NAD,   2. Normal affect and insight, Not Suicidal or Homicidal, Awake Alert, Oriented X 3.  3. No F.N deficits, ALL C.Nerves Intact, Strength 5/5 all 4 extremities, Sensation intact all 4 extremities, Plantars down going.  4. Ears and Eyes appear Normal, Conjunctivae clear, PERRLA. Moist Oral Mucosa.  5. Supple Neck, No JVD, No cervical lymphadenopathy appriciated, No Carotid Bruits.  6. Symmetrical Chest wall movement, Good air movement bilaterally, slight crackle left lung base, slight exp wheezing  7. RRR, No Gallops, Rubs or Murmurs, No Parasternal Heave.  8. Positive Bowel Sounds, Abdomen Soft, No tenderness, No organomegaly appriciated,No rebound -guarding or rigidity.  9.  No Cyanosis, Normal Skin Turgor, No Skin Rash or Bruise.  10. Good muscle tone,  joints appear normal , no effusions, Normal ROM.  11. No Palpable Lymph Nodes in Neck or Axillae     Data Review:    CBC Recent Labs  Lab 01/12/17 2018  WBC 7.2  HGB 9.9*  HCT 31.1*  PLT 171  MCV 65.6*  MCH 20.9*  MCHC 31.8  RDW 20.9*  LYMPHSABS 2.7  MONOABS 0.4  EOSABS 0.1  BASOSABS 0.0    ------------------------------------------------------------------------------------------------------------------  Chemistries  Recent Labs  Lab 01/12/17 2018  NA 141  K 5.1  CL 108  CO2 24  GLUCOSE 135*  BUN 60*  CREATININE 1.45*  CALCIUM 9.1   ------------------------------------------------------------------------------------------------------------------ estimated creatinine clearance is 34.5 mL/min (A) (by C-G formula based on SCr of 1.45 mg/dL (H)). ------------------------------------------------------------------------------------------------------------------ No results for input(s): TSH, T4TOTAL, T3FREE, THYROIDAB in the last 72 hours.  Invalid input(s): FREET3  Coagulation profile No results for input(s): INR, PROTIME in the last 168 hours. ------------------------------------------------------------------------------------------------------------------- No results for input(s): DDIMER in the last 72 hours. -------------------------------------------------------------------------------------------------------------------  Cardiac Enzymes No results for input(s): CKMB, TROPONINI, MYOGLOBIN in the last 168 hours.  Invalid input(s): CK ------------------------------------------------------------------------------------------------------------------    Component Value Date/Time   BNP 83.7 01/12/2017 2018   BNP 18.5 08/14/2012 0930     ---------------------------------------------------------------------------------------------------------------  Urinalysis    Component Value Date/Time   COLORURINE YELLOW 07/31/2013 1042   APPEARANCEUR CLEAR 07/31/2013 1042   LABSPEC 1.019 07/31/2013 1042   PHURINE 5.0 07/31/2013 1042   GLUCOSEU 100 (A) 07/31/2013 1042   HGBUR NEGATIVE 07/31/2013 1042   BILIRUBINUR neg 09/28/2014 1020   KETONESUR NEGATIVE 07/31/2013 1042   PROTEINUR neg 09/28/2014 1020   PROTEINUR 30 (A) 07/31/2013 1042   UROBILINOGEN negative  09/28/2014 1020   UROBILINOGEN 0.2 07/31/2013 1042   NITRITE neg 09/28/2014 1020   NITRITE NEGATIVE 07/31/2013 1042   LEUKOCYTESUR Negative 09/28/2014 1020    ----------------------------------------------------------------------------------------------------------------  Imaging Results:    Dg Chest 2 View  Result Date: 01/12/2017 CLINICAL DATA:  Productive cough and wheezing for the past couple of days. Chronic shortness of breath. EXAM: CHEST  2 VIEW COMPARISON:  11/25/2016 FINDINGS: The cardiac silhouette remains mildly enlarged. Pulmonary vascular congestion on the prior study has decreased. There is mild streaky opacity in the retrocardiac left lower lobe. No overt edema, pleural effusion, or pneumothorax is identified. No acute osseous abnormality is seen. IMPRESSION: 1. Cardiomegaly with decreased pulmonary vascular congestion. 2. Mild streaky left basilar opacity which may reflect atelectasis or early pneumonia. Electronically Signed   By: Logan Bores M.D.   On: 01/12/2017 18:47    Assessment & Plan:    Active Problems:   Diabetes ([place])   Anemia   HCAP (healthcare-associated pneumonia)   ARF (acute renal failure) (HCC)    Dyspnea secondary to Hcap Blood culture x2 Urine strep, urine legionella antigen vanco iv pharmacy to dose, azactam pharmacy to dose  ARF Mild Hydrate with ns iv HODL lasix HOLD lisinopril Check cmp in am If not improving, consider further w/up  Dm2 Continue metformin for now, if creatinine worsens might have to discontinue fsbs ac and qhs, ISS  Anemia Check cbc in am  Gout Cont allopurinol  Gerd Cont PPI     DVT Prophylaxis   Lovenox - SCDs  AM Labs Ordered, also please review Full Orders  Family Communication: Admission, patients condition and plan of care including tests being ordered have been discussed with the patient  who indicate understanding and agree with the plan and Code Status.  Code Status FULL CODE  Likely DC to   home  Condition GUARDED    Consults called: none  Admission status: inpatient   Time spent in minutes : 45   Jani Gravel M.D on 01/12/2017 at 10:31 PM  Between 7am to 7pm - Pager - [phone]    After 7pm go to [email or web address] - password [facility]  [facility] - Office  [phone]

## 2017-01-13 ENCOUNTER — Encounter (HOSPITAL_COMMUNITY): Payer: Self-pay

## 2017-01-13 DIAGNOSIS — J189 Pneumonia, unspecified organism: Secondary | ICD-10-CM

## 2017-01-13 DIAGNOSIS — E118 Type 2 diabetes mellitus with unspecified complications: Secondary | ICD-10-CM

## 2017-01-13 DIAGNOSIS — N179 Acute kidney failure, unspecified: Secondary | ICD-10-CM

## 2017-01-13 DIAGNOSIS — D649 Anemia, unspecified: Secondary | ICD-10-CM

## 2017-01-13 LAB — URINALYSIS, ROUTINE W REFLEX MICROSCOPIC
BILIRUBIN URINE: NEGATIVE
Glucose, UA: 150 mg/dL — AB
Hgb urine dipstick: NEGATIVE
KETONES UR: NEGATIVE mg/dL
Leukocytes, UA: NEGATIVE
NITRITE: NEGATIVE
PROTEIN: NEGATIVE mg/dL
Specific Gravity, Urine: 1.014 (ref 1.005–1.030)
pH: 5 (ref 5.0–8.0)

## 2017-01-13 LAB — CBG MONITORING, ED
GLUCOSE-CAPILLARY: 300 mg/dL — AB (ref 65–99)
GLUCOSE-CAPILLARY: 337 mg/dL — AB (ref 65–99)
GLUCOSE-CAPILLARY: 295 mg/dL — AB (ref 65–99)

## 2017-01-13 LAB — HIV ANTIBODY (ROUTINE TESTING W REFLEX): HIV Screen 4th Generation wRfx: NONREACTIVE

## 2017-01-13 LAB — RESPIRATORY PANEL BY PCR
ADENOVIRUS-RVPPCR: NOT DETECTED
Bordetella pertussis: NOT DETECTED
CHLAMYDOPHILA PNEUMONIAE-RVPPCR: NOT DETECTED
CORONAVIRUS NL63-RVPPCR: NOT DETECTED
Coronavirus 229E: NOT DETECTED
Coronavirus HKU1: NOT DETECTED
Coronavirus OC43: NOT DETECTED
INFLUENZA A-RVPPCR: NOT DETECTED
INFLUENZA B-RVPPCR: NOT DETECTED
MYCOPLASMA PNEUMONIAE-RVPPCR: NOT DETECTED
Metapneumovirus: NOT DETECTED
PARAINFLUENZA VIRUS 4-RVPPCR: NOT DETECTED
Parainfluenza Virus 1: NOT DETECTED
Parainfluenza Virus 2: NOT DETECTED
Parainfluenza Virus 3: NOT DETECTED
Respiratory Syncytial Virus: NOT DETECTED
Rhinovirus / Enterovirus: DETECTED — AB

## 2017-01-13 LAB — CREATININE, SERUM
Creatinine, Ser: 1.43 mg/dL — ABNORMAL HIGH (ref 0.44–1.00)
GFR calc Af Amer: 38 mL/min — ABNORMAL LOW (ref >60)
GFR calc non Af Amer: 33 mL/min — ABNORMAL LOW (ref >60)

## 2017-01-13 LAB — GLUCOSE, CAPILLARY
Glucose-Capillary: 217 mg/dL — ABNORMAL HIGH (ref 65–99)
Glucose-Capillary: 115 mg/dL — ABNORMAL HIGH (ref 65–99)

## 2017-01-13 MED ORDER — INSULIN ASPART 100 UNIT/ML ~~LOC~~ SOLN
0.0000 [IU] | Freq: Three times a day (TID) | SUBCUTANEOUS | Status: DC
  Administered 2017-01-13: 5 [IU] via SUBCUTANEOUS
  Administered 2017-01-13: 8 [IU] via SUBCUTANEOUS
  Administered 2017-01-14: 3 [IU] via SUBCUTANEOUS
  Administered 2017-01-14: 2 [IU] via SUBCUTANEOUS
  Administered 2017-01-15: 8 [IU] via SUBCUTANEOUS
  Filled 2017-01-13: qty 1

## 2017-01-13 MED ORDER — METFORMIN HCL 500 MG PO TABS
1000.0000 mg | ORAL_TABLET | Freq: Two times a day (BID) | ORAL | Status: DC
  Administered 2017-01-13: 1000 mg via ORAL
  Filled 2017-01-13: qty 2

## 2017-01-13 MED ORDER — VANCOMYCIN HCL IN DEXTROSE 750-5 MG/150ML-% IV SOLN
750.0000 mg | INTRAVENOUS | Status: DC
  Administered 2017-01-13: 750 mg via INTRAVENOUS
  Filled 2017-01-13: qty 150

## 2017-01-13 MED ORDER — DOCUSATE SODIUM 100 MG PO CAPS
100.0000 mg | ORAL_CAPSULE | Freq: Two times a day (BID) | ORAL | Status: DC
  Administered 2017-01-13 – 2017-01-15 (×5): 100 mg via ORAL
  Filled 2017-01-13 (×5): qty 1

## 2017-01-13 MED ORDER — ATORVASTATIN CALCIUM 40 MG PO TABS
40.0000 mg | ORAL_TABLET | Freq: Every day | ORAL | Status: DC
  Administered 2017-01-13 – 2017-01-14 (×2): 40 mg via ORAL
  Filled 2017-01-13 (×3): qty 1

## 2017-01-13 MED ORDER — INSULIN NPH (HUMAN) (ISOPHANE) 100 UNIT/ML ~~LOC~~ SUSP
40.0000 [IU] | Freq: Two times a day (BID) | SUBCUTANEOUS | Status: DC
  Administered 2017-01-13 – 2017-01-15 (×5): 40 [IU] via SUBCUTANEOUS
  Filled 2017-01-13 (×2): qty 10

## 2017-01-13 MED ORDER — ENOXAPARIN SODIUM 30 MG/0.3ML ~~LOC~~ SOLN
30.0000 mg | SUBCUTANEOUS | Status: DC
  Administered 2017-01-13 – 2017-01-15 (×3): 30 mg via SUBCUTANEOUS
  Filled 2017-01-13 (×3): qty 0.3

## 2017-01-13 MED ORDER — GUAIFENESIN-DM 100-10 MG/5ML PO SYRP
5.0000 mL | ORAL_SOLUTION | ORAL | Status: DC | PRN
  Administered 2017-01-13 – 2017-01-14 (×2): 5 mL via ORAL
  Filled 2017-01-13 (×2): qty 10

## 2017-01-13 MED ORDER — POLYSACCHARIDE IRON COMPLEX 150 MG PO CAPS
150.0000 mg | ORAL_CAPSULE | Freq: Every day | ORAL | Status: DC
  Administered 2017-01-13 – 2017-01-15 (×3): 150 mg via ORAL
  Filled 2017-01-13 (×3): qty 1

## 2017-01-13 MED ORDER — PANTOPRAZOLE SODIUM 40 MG PO TBEC
40.0000 mg | DELAYED_RELEASE_TABLET | Freq: Every day | ORAL | Status: DC
  Administered 2017-01-13 – 2017-01-15 (×3): 40 mg via ORAL
  Filled 2017-01-13 (×3): qty 1

## 2017-01-13 MED ORDER — PROSIGHT PO TABS
1.0000 | ORAL_TABLET | Freq: Every day | ORAL | Status: DC
  Administered 2017-01-13 – 2017-01-15 (×3): 1 via ORAL
  Filled 2017-01-13 (×3): qty 1

## 2017-01-13 MED ORDER — INSULIN ASPART 100 UNIT/ML ~~LOC~~ SOLN
4.0000 [IU] | Freq: Three times a day (TID) | SUBCUTANEOUS | Status: DC
  Administered 2017-01-13 – 2017-01-15 (×3): 4 [IU] via SUBCUTANEOUS
  Filled 2017-01-13: qty 1

## 2017-01-13 MED ORDER — PRESERVISION AREDS 2+MULTI VIT PO CAPS: ORAL_CAPSULE | Freq: Every day | ORAL | Status: DC

## 2017-01-13 MED ORDER — DEXTROSE 5 % IV SOLN
2.0000 g | Freq: Three times a day (TID) | INTRAVENOUS | Status: DC
  Administered 2017-01-13 (×2): 2 g via INTRAVENOUS
  Filled 2017-01-13 (×4): qty 2

## 2017-01-13 MED ORDER — DEXTROSE 5 % IV SOLN
2.0000 g | Freq: Three times a day (TID) | INTRAVENOUS | Status: DC
  Filled 2017-01-13 (×2): qty 2

## 2017-01-13 MED ORDER — AZTREONAM IN DEXTROSE 2 GM/50ML IV SOLN
2.0000 g | Freq: Three times a day (TID) | INTRAVENOUS | Status: DC
  Administered 2017-01-13 – 2017-01-14 (×2): 2 g via INTRAVENOUS
  Filled 2017-01-13 (×4): qty 50

## 2017-01-13 MED ORDER — SODIUM CHLORIDE 0.9 % IV SOLN
INTRAVENOUS | Status: AC
  Administered 2017-01-13: 04:00:00 via INTRAVENOUS

## 2017-01-13 MED ORDER — ALLOPURINOL 300 MG PO TABS
300.0000 mg | ORAL_TABLET | Freq: Every day | ORAL | Status: DC
  Administered 2017-01-13 – 2017-01-15 (×3): 300 mg via ORAL
  Filled 2017-01-13 (×3): qty 1

## 2017-01-13 MED ORDER — ALBUTEROL SULFATE HFA 108 (90 BASE) MCG/ACT IN AERS
2.0000 | INHALATION_SPRAY | RESPIRATORY_TRACT | Status: DC | PRN
  Filled 2017-01-13: qty 6.7

## 2017-01-13 MED ORDER — ALBUTEROL SULFATE (2.5 MG/3ML) 0.083% IN NEBU
2.5000 mg | INHALATION_SOLUTION | RESPIRATORY_TRACT | Status: DC | PRN
  Administered 2017-01-14 (×3): 2.5 mg via RESPIRATORY_TRACT
  Filled 2017-01-13 (×3): qty 3

## 2017-01-13 NOTE — ED Notes (Signed)
Pharmacy notified. Waiting for pharmacy to send Humulin.

## 2017-01-13 NOTE — Plan of Care (Signed)
  Safety: Ability to remain free from injury will improve 01/13/2017 2130 - Progressing by Norie Latendresse, Sherryll Burger, RN

## 2017-01-13 NOTE — ED Notes (Signed)
Blood cultures were ordered after antibiotics were started, paged Dr. Silas [place] to make aware.

## 2017-01-13 NOTE — Progress Notes (Signed)
PROGRESS NOTE    Lauren Crosby  ZHY:[id number] DOB: 02-Feb-1931 DOA: 01/12/2017 PCP: [name], MD   Brief Narrative: 84 y.o. female, w Thallasemia, hypertension, hyperlipidemia, dm2, diabetic neuropathy, presented with several days of productive cough and increased dyspnea.  Patient was recently admitted at [facility] for the management of pneumonia.  She was sent to the ER by her visiting nurse.  In the ER patient was found to have healthcare associated pneumonia and started on antibiotics.  Assessment & Plan:   #Healthcare associated pneumonia/likely left basal pneumonia: -Continue vancomycin, aztreonam. -Follow-up culture results, respiratory panel, sputum culture, Legionella, strep antigen. -Bronchodilators and supportive care.  #Type 2 diabetes on chronic insulin with hyperglycemia: -Resume NPH insulin, sliding scale.  Monitor blood sugar level.  Correlate modified diet. -hold metformin  #Acute kidney injury likely in the setting of pneumonia/infection: Check UA.  Continue IV fluid.  Monitor BMP.  Avoid nephrotoxins.  #History of gout: Continue allopurinol  #Dyslipidemia: Continue Lipitor.  #Chronic iron deficiency anemia: Continue oral iron.  Monitor CBC.  No sign of active bleeding.  #Physical deconditioning: PT OT and case manager referral for discharge planning.  DVT prophylaxis: Lovenox subcutaneous Code Status: Full code Family Communication: No family at bedside Disposition Plan: Likely discharge home in 1-2 days.  Now waiting for telemetry.    Consultants:   None  Procedures: None Antimicrobials: Vancomycin and aztreonam  Subjective: Seen and examined at bedside.  Reported generalized weakness and cough.  Denies shortness of breath, chest pain, nausea or vomiting.  Objective: Vitals:   01/13/17 0500 01/13/17 0700 01/13/17 0800 01/13/17 0900  BP: (!) 137/52 (!) 144/51 (!) 149/75 (!) 155/52  Pulse: 92 91 95 99  Resp: 17 (!) 25 19 (!) 21    Temp:      TempSrc:      SpO2: 97% 95% 94% 97%  Weight:      Height:        Intake/Output Summary (Last 24 hours) at 01/13/2017 1024 Last data filed at 01/13/2017 2952 Gross per 24 hour  Intake 300 ml  Output -  Net 300 ml   Filed Weights   01/12/17 1613  Weight: 110.7 kg (244 lb)    Examination:  General exam: Appears calm and comfortable  Respiratory system: Bibasal rhonchi, no wheezing.  Respiratory effort normal. Cardiovascular system: S1 & S2 heard, RRR.  Nonpitting edema. Gastrointestinal system: Abdomen is nondistended, soft and nontender. Normal bowel sounds heard. Central nervous system: Alert and oriented. No focal neurological deficits. Extremities: Right leg with bandages wrapped Psychiatry: Judgement and insight appear normal. Mood & affect appropriate.     Data Reviewed: I have personally reviewed following labs and imaging studies  CBC: Recent Labs  Lab 01/12/17 2018  WBC 7.2  NEUTROABS 4.0  HGB 9.9*  HCT 31.1*  MCV 65.6*  PLT 841   Basic Metabolic Panel: Recent Labs  Lab 01/12/17 2018 01/13/17 0332  NA 141  --   K 5.1  --   CL 108  --   CO2 24  --   GLUCOSE 135*  --   BUN 60*  --   CREATININE 1.45* 1.43*  CALCIUM 9.1  --    GFR: Estimated Creatinine Clearance: 35 mL/min (A) (by C-G formula based on SCr of 1.43 mg/dL (H)). Liver Function Tests: No results for input(s): AST, ALT, ALKPHOS, BILITOT, PROT, ALBUMIN in the last 168 hours. No results for input(s): LIPASE, AMYLASE in the last 168 hours. No results for input(s): AMMONIA  in the last 168 hours. Coagulation Profile: No results for input(s): INR, PROTIME in the last 168 hours. Cardiac Enzymes: No results for input(s): CKTOTAL, CKMB, CKMBINDEX, TROPONINI in the last 168 hours. BNP (last 3 results) No results for input(s): PROBNP in the last 8760 hours. HbA1C: No results for input(s): HGBA1C in the last 72 hours. CBG: Recent Labs  Lab 01/13/17 1001  GLUCAP 300*   Lipid  Profile: No results for input(s): CHOL, HDL, LDLCALC, TRIG, CHOLHDL, LDLDIRECT in the last 72 hours. Thyroid Function Tests: No results for input(s): TSH, T4TOTAL, FREET4, T3FREE, THYROIDAB in the last 72 hours. Anemia Panel: No results for input(s): VITAMINB12, FOLATE, FERRITIN, TIBC, IRON, RETICCTPCT in the last 72 hours. Sepsis Labs: No results for input(s): PROCALCITON, LATICACIDVEN in the last 168 hours.  No results found for this or any previous visit (from the past 240 hour(s)).       Radiology Studies: Dg Chest 2 View  Result Date: 01/12/2017 CLINICAL DATA:  Productive cough and wheezing for the past couple of days. Chronic shortness of breath. EXAM: CHEST  2 VIEW COMPARISON:  11/25/2016 FINDINGS: The cardiac silhouette remains mildly enlarged. Pulmonary vascular congestion on the prior study has decreased. There is mild streaky opacity in the retrocardiac left lower lobe. No overt edema, pleural effusion, or pneumothorax is identified. No acute osseous abnormality is seen. IMPRESSION: 1. Cardiomegaly with decreased pulmonary vascular congestion. 2. Mild streaky left basilar opacity which may reflect atelectasis or early pneumonia. Electronically Signed   By: Logan Bores M.D.   On: 01/12/2017 18:47        Scheduled Meds: . allopurinol  300 mg Oral Daily  . atorvastatin  40 mg Oral q1800  . docusate sodium  100 mg Oral Q12H  . enoxaparin (LOVENOX) injection  30 mg Subcutaneous Q24H  . insulin aspart  0-15 Units Subcutaneous TID WC  . insulin aspart  4 Units Subcutaneous TID WC  . insulin NPH Human  40 Units Subcutaneous BID AC & HS  . iron polysaccharides  150 mg Oral Daily  . metFORMIN  1,000 mg Oral BID WC  . pantoprazole  40 mg Oral Daily  . PRESERVISION AREDS 2+MULTI VIT   Oral Daily   Continuous Infusions: . sodium chloride 75 mL/hr at 01/13/17 0336  . aztreonam Stopped (01/13/17 4166)  . vancomycin 750 mg (01/13/17 1004)     LOS: 1 day    Dron Tanna Furry, MD [facility] Pager [phone]  If 7PM-7AM, please contact night-coverage [email or web address] Password [facility] 01/13/2017, 10:24 AM

## 2017-01-13 NOTE — Progress Notes (Signed)
Pharmacy Antibiotic Note  Lauren Crosby is a 84 y.o. female admitted on 01/12/2017 with pneumonia. Aztreonam was initiated by MD and pharmacy was consulted for vancomycin dosing.  Patient already received vancomycin 1 gram IV in ED.    Plan: Vancomycin 750 mg IV q36h, next dose at 0800 (completes staggered loading regimen).  Goal AUC 400-500 Aztreonam 2 grams IV q8h as ordered by MD Follow renal function, cultures, clinical course   Height: 5\' 2"  (157.5 cm) Weight: 244 lb (110.7 kg) IBW/kg (Calculated) : 50.1  Temp (24hrs), Avg:97.8 F (36.6 C), Min:97.8 F (36.6 C), Max:97.8 F (36.6 C)  Recent Labs  Lab 01/12/17 2018  WBC 7.2  CREATININE 1.45*    Estimated Creatinine Clearance: 34.5 mL/min (A) (by C-G formula based on SCr of 1.45 mg/dL (H)).    Allergies  Allergen Reactions  . Penicillins Other (See Comments)    Has patient had a PCN reaction causing immediate rash, facial/tongue/throat swelling, SOB or lightheadedness with hypotension: No Has patient had a PCN reaction causing severe rash involving mucus membranes or skin necrosis: No Has patient had a PCN reaction that required hospitalization: No Has patient had a PCN reaction occurring within the last 10 years: Unknown If all of the above answers are "NO", then may proceed with Cephalosporin use.    Antimicrobials this admission: 1/4 aztreonam >>  1/4 vancomycin >>   Dose adjustments this admission: n/a  Microbiology results:   Thank you for allowing pharmacy to be a part of this patient's care.   Clayburn Pert, PharmD, BCPS Pager: [phone] 01/13/2017  1:42 AM

## 2017-01-14 DIAGNOSIS — B348 Other viral infections of unspecified site: Secondary | ICD-10-CM

## 2017-01-14 DIAGNOSIS — J181 Lobar pneumonia, unspecified organism: Secondary | ICD-10-CM

## 2017-01-14 LAB — BASIC METABOLIC PANEL
Anion gap: 7 (ref 5–15)
BUN: 48 mg/dL — AB (ref 6–20)
CALCIUM: 9 mg/dL (ref 8.9–10.3)
CO2: 24 mmol/L (ref 22–32)
Chloride: 109 mmol/L (ref 101–111)
Creatinine, Ser: 1.12 mg/dL — ABNORMAL HIGH (ref 0.44–1.00)
GFR calc Af Amer: 51 mL/min — ABNORMAL LOW (ref >60)
GFR, EST NON AFRICAN AMERICAN: 44 mL/min — AB (ref >60)
GLUCOSE: 85 mg/dL (ref 65–99)
Potassium: 4.7 mmol/L (ref 3.5–5.1)
Sodium: 140 mmol/L (ref 135–145)

## 2017-01-14 LAB — GLUCOSE, CAPILLARY
GLUCOSE-CAPILLARY: 85 mg/dL (ref 65–99)
GLUCOSE-CAPILLARY: 192 mg/dL — AB (ref 65–99)
GLUCOSE-CAPILLARY: 321 mg/dL — AB (ref 65–99)
Glucose-Capillary: 129 mg/dL — ABNORMAL HIGH (ref 65–99)

## 2017-01-14 LAB — MRSA PCR SCREENING: MRSA by PCR: NEGATIVE

## 2017-01-14 LAB — STREP PNEUMONIAE URINARY ANTIGEN: Strep Pneumo Urinary Antigen: NEGATIVE

## 2017-01-14 MED ORDER — PREDNISONE 50 MG PO TABS: 50.0000 mg | ORAL_TABLET | Freq: Every day | ORAL | 0 refills | Status: AC

## 2017-01-14 MED ORDER — GUAIFENESIN-DM 100-10 MG/5ML PO SYRP: 5.0000 mL | ORAL_SOLUTION | ORAL | 0 refills | Status: DC | PRN

## 2017-01-14 MED ORDER — INSULIN ASPART 100 UNIT/ML ~~LOC~~ SOLN
6.0000 [IU] | Freq: Once | SUBCUTANEOUS | Status: AC
  Administered 2017-01-14: 6 [IU] via SUBCUTANEOUS

## 2017-01-14 MED ORDER — LEVOFLOXACIN 500 MG PO TABS
500.0000 mg | ORAL_TABLET | Freq: Every day | ORAL | Status: DC
  Administered 2017-01-14 – 2017-01-15 (×2): 500 mg via ORAL
  Filled 2017-01-14 (×2): qty 1

## 2017-01-14 MED ORDER — TRAZODONE HCL 50 MG PO TABS
50.0000 mg | ORAL_TABLET | Freq: Once | ORAL | Status: AC
  Administered 2017-01-15: 50 mg via ORAL
  Filled 2017-01-14: qty 1

## 2017-01-14 MED ORDER — LEVOFLOXACIN 750 MG PO TABS: 750.0000 mg | ORAL_TABLET | Freq: Every day | ORAL | 0 refills | Status: AC

## 2017-01-14 MED ORDER — METHYLPREDNISOLONE SODIUM SUCC 125 MG IJ SOLR
80.0000 mg | Freq: Two times a day (BID) | INTRAMUSCULAR | Status: DC
  Administered 2017-01-14 (×2): 80 mg via INTRAVENOUS
  Filled 2017-01-14 (×2): qty 2

## 2017-01-14 NOTE — Progress Notes (Signed)
After discharge order patient's cough got little worse associated with wheezing.  She is not at baseline.  She has weakness as well. -Her symptoms mostly due to rhinovirus. -Plan for continuing bronchodilators and adding IV Solu-Medrol. -Work with PT OT. -Counseling discharge for today.  Plan for discharge home likely tomorrow.

## 2017-01-14 NOTE — Discharge Summary (Addendum)
Physician Discharge Summary  Lauren Crosby HCW:[id number] DOB: 09-16-1931 DOA: 01/12/2017  PCP: [name], MD  Admit date: 01/12/2017 Discharge date: 01/15/2017  Admitted From:home Disposition:home  Recommendations for Outpatient Follow-up:  1. Follow up with PCP in 1-2 weeks 2. Please obtain BMP/CBC in one week  Home Health:yes Equipment/Devices:none Discharge Condition:stable CODE STATUS:full code Diet recommendation: Carb modified heart healthy diet.  Brief/Interim Summary:84 y.o.female,w Thallasemia, hypertension, hyperlipidemia, dm2, diabetic neuropathy, presented with several days of productive cough and increased dyspnea.  Patient was recently admitted at [facility] for the management of pneumonia.  She was sent to the ER by her visiting nurse.  In the ER patient was found to have  pneumonia and started on antibiotics.  #Left basal pneumonia in the setting of rhinovirus positive.  On admission patient was treated with vancomycin and aztreonam for presumed healthcare associated pneumonia.  Since rhinovirus is positive the broad-spectrum antibiotics discontinued.  Patient is clinically improved.  Has no shortness of breath.  Hypoxia improved.  Still having some cough.  Discharging with 5 days of Levaquin for possible bacterial superimposed.  Recommended to follow-up with PCP.  Clinically improved. -Starting short course of prednisone for 3 days.  #Type 2 diabetes on chronic insulin with hyperglycemia: -Resume home medication including insulin and metformin.  #Acute kidney injury likely in the setting of pneumonia/infection: Serum creatinine level improved. #History of gout: Continue allopurinol  #Dyslipidemia: Continue Lipitor.  #Chronic iron deficiency anemia: Continue oral iron.  Monitor CBC.  No sign of active bleeding.  #Physical deconditioning: Discharging with home care services.  Patient is clinically improved.  Denied nausea vomiting chest pain or  shortness of breath.  Continue prednisone, bronchodilators and oral Levaquin.  Recommend to follow-up with PCP.  Home care services on discharge.  Discharge Diagnoses:  Principal Problem: Left lower lobe pneumonia due to rhinovirus. Active Problems:   Diabetes ([place])   Chronic anemia   ARF (acute renal failure) (HCC)   Rhinovirus infection    Discharge Instructions  Discharge Instructions    Call MD for:  difficulty breathing, headache or visual disturbances   Complete by:  As directed    Call MD for:  extreme fatigue   Complete by:  As directed    Call MD for:  hives   Complete by:  As directed    Call MD for:  persistant dizziness or light-headedness   Complete by:  As directed    Call MD for:  persistant nausea and vomiting   Complete by:  As directed    Call MD for:  severe uncontrolled pain   Complete by:  As directed    Call MD for:  temperature >100.4   Complete by:  As directed    Diet - low sodium heart healthy   Complete by:  As directed    Diet Carb Modified   Complete by:  As directed    Increase activity slowly   Complete by:  As directed      Allergies as of 01/15/2017      Reactions   Penicillins Other (See Comments)   Has patient had a PCN reaction causing immediate rash, facial/tongue/throat swelling, SOB or lightheadedness with hypotension: No Has patient had a PCN reaction causing severe rash involving mucus membranes or skin necrosis: No Has patient had a PCN reaction that required hospitalization: No Has patient had a PCN reaction occurring within the last 10 years: Unknown If all of the above answers are "NO", then may proceed with Cephalosporin  use.      Medication List    TAKE these medications   allopurinol 300 MG tablet Commonly known as:  ZYLOPRIM Take 300 mg by mouth daily.   docusate sodium 100 MG capsule Commonly known as:  COLACE Take 100 mg by mouth every 12 (twelve) hours.   esomeprazole 40 MG capsule Commonly known as:   NEXIUM Take 40 mg by mouth daily.   furosemide 20 MG tablet Commonly known as:  LASIX Take 20-60 mg by mouth daily as needed. 60mg  in the morning, 20 mg in the evening   guaiFENesin-dextromethorphan 100-10 MG/5ML syrup Commonly known as:  ROBITUSSIN DM Take 5 mLs by mouth every 4 (four) hours as needed for cough.   insulin NPH-regular Human (70-30) 100 UNIT/ML injection Commonly known as:  NOVOLIN 70/30 45 units in the am and 50 units at supper   iron polysaccharides 150 MG capsule Commonly known as:  NIFEREX Take 150 mg by mouth daily.   levofloxacin 750 MG tablet Commonly known as:  LEVAQUIN Take 1 tablet (750 mg total) by mouth daily for 5 days.   lisinopril 5 MG tablet Commonly known as:  PRINIVIL,ZESTRIL Take 5 mg by mouth daily.   metFORMIN 1000 MG tablet Commonly known as:  GLUCOPHAGE Take 1,000 mg by mouth 2 (two) times daily with a meal.   predniSONE 50 MG tablet Commonly known as:  DELTASONE Take 1 tablet (50 mg total) by mouth daily with breakfast for 3 days.   PRESERVISION AREDS 2+MULTI VIT PO Take 1 capsule by mouth daily.   simvastatin 80 MG tablet Commonly known as:  ZOCOR Take 40 mg by mouth at bedtime.   VENTOLIN HFA 108 (90 Base) MCG/ACT inhaler Generic drug:  albuterol Take 2 puffs by mouth every 4 (four) hours as needed. for wheezing      Follow-up Information    [name], MD. Schedule an appointment as soon as possible for a visit in 1 week(s).   Specialty:  Endocrinology Contact information: [place] [address] [phone]        Lelon Perla, MD Follow up.   Specialty:  Cardiology Contact information: [address] [place] [address] [phone]          Allergies  Allergen Reactions  . Penicillins Other (See Comments)    Has patient had a PCN reaction causing immediate rash, facial/tongue/throat swelling, SOB or lightheadedness with hypotension: No Has patient had a PCN  reaction causing severe rash involving mucus membranes or skin necrosis: No Has patient had a PCN reaction that required hospitalization: No Has patient had a PCN reaction occurring within the last 10 years: Unknown If all of the above answers are "NO", then may proceed with Cephalosporin use.    Consultations: None  Procedures/Studies: None  Subjective: Seen and examined at bedside.  Reported feeling much better.  Has a mild dry cough but denied chest pain, shortness of breath.  No nausea vomiting.  Wanted to go home today.  Discharge Exam: Vitals:   01/14/17 2254 01/15/17 0532  BP:  (!) 157/78  Pulse:  77  Resp:  20  Temp:  98.2 F (36.8 C)  SpO2: 98% 99%   Vitals:   01/14/17 1339 01/14/17 2104 01/14/17 2254 01/15/17 0532  BP: (!) 141/61 (!) 153/68  (!) 157/78  Pulse: 86 79  77  Resp: (!) 22 20  20   Temp: 99.3 F (37.4 C) 97.8 F (36.6 C)  98.2 F (36.8 C)  TempSrc: Oral Oral  Oral  SpO2: 99% 98% 98% 99%  Weight:      Height:        General: Pt is alert, awake, not in acute distress Cardiovascular: RRR, S1/S2 +, no rubs, no gallops Respiratory: Mild rhonchi on basis, no wheezing, good air entry. Abdominal: Soft, NT, ND, bowel sounds + Extremities: no edema, no cyanosis    The results of significant diagnostics from this hospitalization (including imaging, microbiology, ancillary and laboratory) are listed below for reference.     Microbiology: Recent Results (from the past 240 hour(s))  Culture, blood (routine x 2) Call MD if unable to obtain prior to antibiotics being given     Status: None (Preliminary result)   Collection Time: 01/13/17  2:51 AM  Result Value Ref Range Status   Specimen Description BLOOD LEFT ANTECUBITAL  Final   Special Requests   Final    BOTTLES DRAWN AEROBIC AND ANAEROBIC Blood Culture adequate volume   Culture   Final    NO GROWTH 1 DAY Performed at [facility], [address] [place], [place] [address]    Report Status  PENDING  Incomplete  Culture, blood (routine x 2) Call MD if unable to obtain prior to antibiotics being given     Status: None (Preliminary result)   Collection Time: 01/13/17  2:48 PM  Result Value Ref Range Status   Specimen Description BLOOD RIGHT FOREARM  Final   Special Requests IN PEDIATRIC BOTTLE Blood Culture adequate volume  Final   Culture   Final    NO GROWTH < 24 HOURS Performed at [facility], [place] [address], [place], [place] [address]    Report Status PENDING  Incomplete  Respiratory Panel by PCR     Status: Abnormal   Collection Time: 01/13/17  5:10 PM  Result Value Ref Range Status   Adenovirus NOT DETECTED NOT DETECTED Final   Coronavirus 229E NOT DETECTED NOT DETECTED Final   Coronavirus HKU1 NOT DETECTED NOT DETECTED Final   Coronavirus NL63 NOT DETECTED NOT DETECTED Final   Coronavirus OC43 NOT DETECTED NOT DETECTED Final   Metapneumovirus NOT DETECTED NOT DETECTED Final   Rhinovirus / Enterovirus DETECTED (A) NOT DETECTED Final   Influenza A NOT DETECTED NOT DETECTED Final   Influenza B NOT DETECTED NOT DETECTED Final   Parainfluenza Virus 1 NOT DETECTED NOT DETECTED Final   Parainfluenza Virus 2 NOT DETECTED NOT DETECTED Final   Parainfluenza Virus 3 NOT DETECTED NOT DETECTED Final   Parainfluenza Virus 4 NOT DETECTED NOT DETECTED Final   Respiratory Syncytial Virus NOT DETECTED NOT DETECTED Final   Bordetella pertussis NOT DETECTED NOT DETECTED Final   Chlamydophila pneumoniae NOT DETECTED NOT DETECTED Final   Mycoplasma pneumoniae NOT DETECTED NOT DETECTED Final    Comment: Performed at [facility], [place] [address], [place], [place] [address]  MRSA PCR Screening     Status: None   Collection Time: 01/14/17  8:02 AM  Result Value Ref Range Status   MRSA by PCR NEGATIVE NEGATIVE Final    Comment:        The GeneXpert MRSA Assay (FDA approved for NASAL specimens only), is one component of a comprehensive MRSA colonization surveillance  program. It is not intended to diagnose MRSA infection nor to guide or monitor treatment for MRSA infections.      Labs: BNP (last 3 results) Recent Labs    01/12/17 2018  BNP 83.7   Basic  Metabolic Panel: Recent Labs  Lab 01/12/17 2018 01/13/17 0332 01/14/17 0521 01/15/17 0438  NA 141  --  140  --   K 5.1  --  4.7  --   CL 108  --  109  --   CO2 24  --  24  --   GLUCOSE 135*  --  85  --   BUN 60*  --  48*  --   CREATININE 1.45* 1.43* 1.12* 1.13*  CALCIUM 9.1  --  9.0  --    Liver Function Tests: No results for input(s): AST, ALT, ALKPHOS, BILITOT, PROT, ALBUMIN in the last 168 hours. No results for input(s): LIPASE, AMYLASE in the last 168 hours. No results for input(s): AMMONIA in the last 168 hours. CBC: Recent Labs  Lab 01/12/17 2018  WBC 7.2  NEUTROABS 4.0  HGB 9.9*  HCT 31.1*  MCV 65.6*  PLT 171   Cardiac Enzymes: No results for input(s): CKTOTAL, CKMB, CKMBINDEX, TROPONINI in the last 168 hours. BNP: Invalid input(s): POCBNP CBG: Recent Labs  Lab 01/14/17 0730 01/14/17 1142 01/14/17 1652 01/14/17 2101 01/15/17 0737  GLUCAP 85 129* 192* 321* 256*   D-Dimer No results for input(s): DDIMER in the last 72 hours. Hgb A1c No results for input(s): HGBA1C in the last 72 hours. Lipid Profile No results for input(s): CHOL, HDL, LDLCALC, TRIG, CHOLHDL, LDLDIRECT in the last 72 hours. Thyroid function studies No results for input(s): TSH, T4TOTAL, T3FREE, THYROIDAB in the last 72 hours.  Invalid input(s): FREET3 Anemia work up No results for input(s): VITAMINB12, FOLATE, FERRITIN, TIBC, IRON, RETICCTPCT in the last 72 hours. Urinalysis    Component Value Date/Time   COLORURINE YELLOW 01/13/2017 1104   APPEARANCEUR CLEAR 01/13/2017 1104   LABSPEC 1.014 01/13/2017 1104   PHURINE 5.0 01/13/2017 1104   GLUCOSEU 150 (A) 01/13/2017 1104   HGBUR NEGATIVE 01/13/2017 1104   BILIRUBINUR NEGATIVE 01/13/2017 1104   BILIRUBINUR neg 09/28/2014 1020    KETONESUR NEGATIVE 01/13/2017 1104   PROTEINUR NEGATIVE 01/13/2017 1104   UROBILINOGEN negative 09/28/2014 1020   UROBILINOGEN 0.2 07/31/2013 1042   NITRITE NEGATIVE 01/13/2017 1104   LEUKOCYTESUR NEGATIVE 01/13/2017 1104   Sepsis Labs Invalid input(s): PROCALCITONIN,  WBC,  LACTICIDVEN Microbiology Recent Results (from the past 240 hour(s))  Culture, blood (routine x 2) Call MD if unable to obtain prior to antibiotics being given     Status: None (Preliminary result)   Collection Time: 01/13/17  2:51 AM  Result Value Ref Range Status   Specimen Description BLOOD LEFT ANTECUBITAL  Final   Special Requests   Final    BOTTLES DRAWN AEROBIC AND ANAEROBIC Blood Culture adequate volume   Culture   Final    NO GROWTH 1 DAY Performed at [facility], [place] [address], [place], [place] [address]    Report Status PENDING  Incomplete  Culture, blood (routine x 2) Call MD if unable to obtain prior to antibiotics being given     Status: None (Preliminary result)   Collection Time: 01/13/17  2:48 PM  Result Value Ref Range Status   Specimen Description BLOOD RIGHT FOREARM  Final   Special Requests IN PEDIATRIC BOTTLE Blood Culture adequate volume  Final   Culture   Final    NO GROWTH < 24 HOURS Performed at [facility], [place] [address], [place], [place] [address]    Report Status PENDING  Incomplete  Respiratory Panel by PCR     Status: Abnormal  Collection Time: 01/13/17  5:10 PM  Result Value Ref Range Status   Adenovirus NOT DETECTED NOT DETECTED Final   Coronavirus 229E NOT DETECTED NOT DETECTED Final   Coronavirus HKU1 NOT DETECTED NOT DETECTED Final   Coronavirus NL63 NOT DETECTED NOT DETECTED Final   Coronavirus OC43 NOT DETECTED NOT DETECTED Final   Metapneumovirus NOT DETECTED NOT DETECTED Final   Rhinovirus / Enterovirus DETECTED (A) NOT DETECTED Final   Influenza A NOT DETECTED NOT DETECTED Final   Influenza B NOT DETECTED NOT DETECTED Final   Parainfluenza Virus  1 NOT DETECTED NOT DETECTED Final   Parainfluenza Virus 2 NOT DETECTED NOT DETECTED Final   Parainfluenza Virus 3 NOT DETECTED NOT DETECTED Final   Parainfluenza Virus 4 NOT DETECTED NOT DETECTED Final   Respiratory Syncytial Virus NOT DETECTED NOT DETECTED Final   Bordetella pertussis NOT DETECTED NOT DETECTED Final   Chlamydophila pneumoniae NOT DETECTED NOT DETECTED Final   Mycoplasma pneumoniae NOT DETECTED NOT DETECTED Final    Comment: Performed at [facility], [place] [address] [place], [place] [address]  MRSA PCR Screening     Status: None   Collection Time: 01/14/17  8:02 AM  Result Value Ref Range Status   MRSA by PCR NEGATIVE NEGATIVE Final    Comment:        The GeneXpert MRSA Assay (FDA approved for NASAL specimens only), is one component of a comprehensive MRSA colonization surveillance program. It is not intended to diagnose MRSA infection nor to guide or monitor treatment for MRSA infections.      Time coordinating discharge: 27 minutes  SIGNED:   Rosita Fire, MD  [facility] 01/15/2017, 11:07 AM  If 7PM-7AM, please contact night-coverage [email or web address] Password [email or web address]

## 2017-01-14 NOTE — Evaluation (Signed)
Physical Therapy Evaluation Patient Details Name: Lauren Crosby MRN: [id number] DOB: 07-29-1931 Today's Date: 01/14/2017   History of Present Illness  84 y.o. female, w Thallasemia, hypertension, hyperlipidemia, dm2, diabetic neuropathy, presented with several days of productive cough and increased dyspnea.  Patient was recently admitted at [facility] for the management of pneumonia.  She was sent to the ER by her visiting nurse.  In the ER patient was found to have  pneumonia and started on antibiotics  Clinical Impression  The patient is compromised in activity by dyspnea/wheezing with minimal activity. The patient was recently DC'd from SNF rehab. Plans to return home. Pt admitted with above diagnosis. Pt currently with functional limitations due to the deficits listed below (see PT Problem List).  Pt will benefit from skilled PT to increase their independence and safety with mobility to allow discharge to the venue listed below.       Follow Up Recommendations Home health PT    Equipment Recommendations  None recommended by PT    Recommendations for Other Services       Precautions / Restrictions Precautions Precautions: Fall Precaution Comments: DOE,      Mobility  Bed Mobility Overal bed mobility: Needs Assistance Bed Mobility: Supine to Sit     Supine to sit: Mod assist;HOB elevated     General bed mobility comments: extra assistance to sit upright and for legs over bed edge, use of bed pad to scoot  Transfers Overall transfer level: Needs assistance Equipment used: Rolling walker (2 wheeled) Transfers: Sit to/from [facility] Sit to Stand: Mod assist Stand pivot transfers: Mod assist       General transfer comment: noted expiratory wheezing increased with activity. oxygenn sats 95% on RA.  much increase in WOB with mobility  Ambulation/Gait                Stairs            Wheelchair Mobility    Modified Rankin  (Stroke Patients Only)       Balance                                             Pertinent Vitals/Pain Pain Assessment: Faces Faces Pain Scale: Hurts little more Pain Location: legs Pain Descriptors / Indicators: Aching Pain Intervention(s): Limited activity within patient's tolerance;Monitored during session    [place] expects to be discharged to:: Private residence Living Arrangements: Spouse/significant other Available Help at Discharge: Family Type of Home: House Home Access: Level entry     Home Layout: One level Home Equipment: Environmental consultant - 2 wheels;Wheelchair - Brewing technologist      Prior Function Level of Independence: Needs assistance   Gait / Transfers Assistance Needed: uses WC mostly, short disatnce ambulation, limited since Dc from hospital  ADL's / Homemaking Assistance Needed: spouse assists with all ADL's        Hand Dominance        Extremity/Trunk Assessment   Upper Extremity Assessment Upper Extremity Assessment: Defer to OT evaluation         Cervical / Trunk Assessment Cervical / Trunk Assessment: Normal  Communication      Cognition Arousal/Alertness: Awake/alert Behavior During Therapy: WFL for tasks assessed/performed Overall Cognitive Status: Within Functional Limits for tasks assessed  General Comments      Exercises     Assessment/Plan    PT Assessment Patient needs continued PT services  PT Problem List Decreased strength;Decreased range of motion;Decreased knowledge of use of DME;Decreased activity tolerance;Decreased safety awareness;Decreased balance;Decreased mobility;Cardiopulmonary status limiting activity       PT Treatment Interventions DME instruction;Gait training;Functional mobility training;Therapeutic activities;Patient/family education    PT Goals (Current goals can be found in the Care Plan section)  Acute Rehab  PT Goals Patient Stated Goal: go home PT Goal Formulation: With patient Time For Goal Achievement: 01/28/17 Potential to Achieve Goals: Fair    Frequency Min 3X/week   Barriers to discharge        Co-evaluation               AM-PAC PT "6 Clicks" Daily Activity  Outcome Measure Difficulty turning over in bed (including adjusting bedclothes, sheets and blankets)?: Unable Difficulty moving from lying on back to sitting on the side of the bed? : Unable Difficulty sitting down on and standing up from a chair with arms (e.g., wheelchair, bedside commode, etc,.)?: Unable Help needed moving to and from a bed to chair (including a wheelchair)?: Total Help needed walking in hospital room?: Total Help needed climbing 3-5 steps with a railing? : Total 6 Click Score: 6    End of Session   Activity Tolerance: Treatment limited secondary to medical complications (Comment) Patient left: in chair;with call bell/phone within reach;with chair alarm set Nurse Communication: Mobility status PT Visit Diagnosis: Unsteadiness on feet (R26.81);Difficulty in walking, not elsewhere classified (R26.2)    Time: 4496-7591 PT Time Calculation (min) (ACUTE ONLY): 25 min   Charges:     PT Treatments $Therapeutic Activity: 8-22 mins   PT G CodesTresa Endo PT [phone]   [name] 01/14/2017, 5:13 PM

## 2017-01-14 NOTE — Plan of Care (Signed)
  Respiratory: Ability to maintain adequate ventilation will improve 01/14/2017 2235 - Progressing by Ashley Murrain, RN

## 2017-01-15 LAB — GLUCOSE, CAPILLARY: Glucose-Capillary: 256 mg/dL — ABNORMAL HIGH (ref 65–99)

## 2017-01-15 LAB — LEGIONELLA PNEUMOPHILA SEROGP 1 UR AG: L. pneumophila Serogp 1 Ur Ag: NEGATIVE

## 2017-01-15 LAB — CREATININE, SERUM
CREATININE: 1.13 mg/dL — AB (ref 0.44–1.00)
GFR calc Af Amer: 51 mL/min — ABNORMAL LOW (ref >60)
GFR calc non Af Amer: 44 mL/min — ABNORMAL LOW (ref >60)

## 2017-01-15 NOTE — Care Management Note (Signed)
Case Management Note  Patient Details  Name: Lauren Crosby MRN: [id number] Date of Birth: 22-Apr-1931  Subjective/Objective:  Pt admitted with HCAP                  Action/Plan: Pt's Husband selected [facility], Referral called [phone] and information faxed [phone]   Expected Discharge Date:  01/15/17               Expected Discharge Plan:  [place]  In-House Referral:     Discharge planning Services  CM Consult  Post Acute Care Choice:    Choice offered to:  Spouse  DME Arranged:    DME Agency:  ([facility])  [place] Arranged:  RN, PT, OT, Nurse's Aide [place] Agency:  [facility]  Status of Service:  Completed, signed off  If discussed at [place] of Stay Meetings, dates discussed:    Additional CommentsPurcell Mouton, RN 01/15/2017, 12:41 PM

## 2017-01-15 NOTE — Care Management Important Message (Signed)
Important Message  Patient Details  Name: CONSANDRA LASKE MRN: [id number] Date of Birth: 1931-05-30   Medicare Important Message Given:  Yes    Kerin Salen 01/15/2017, 12:11 PMImportant Message  Patient Details  Name: JESSLYNN KRUCK MRN: [id number] Date of Birth: 30-Jun-1931   Medicare Important Message Given:  Yes    Kerin Salen 01/15/2017, 12:11 PM

## 2017-01-15 NOTE — Progress Notes (Signed)
Patient was seen and examined at bedside.  Denies chest pain, shortness of breath.  Has mild cough.  Mild expiratory wheeze better than yesterday.  Breathing stable and no increased work of breathing.  I think patient is clinically stable to transfer care to outpatient.  Please see discharge summary for details.  Discussed with the nurses and patient in detail at bedside.

## 2017-01-16 DIAGNOSIS — J9601 Acute respiratory failure with hypoxia: Secondary | ICD-10-CM | POA: Diagnosis not present

## 2017-01-16 DIAGNOSIS — R2689 Other abnormalities of gait and mobility: Secondary | ICD-10-CM | POA: Diagnosis not present

## 2017-01-16 DIAGNOSIS — J9611 Chronic respiratory failure with hypoxia: Secondary | ICD-10-CM | POA: Diagnosis not present

## 2017-01-16 DIAGNOSIS — J189 Pneumonia, unspecified organism: Secondary | ICD-10-CM | POA: Diagnosis not present

## 2017-01-16 NOTE — Progress Notes (Signed)
Referring-Stephen Tenna Child MD Reason for referral-CHF  HPI: 84 yo female for evaluation of CHF at request of Sheela Stack MD. Cardiac cath 2005 showed normal LV function and no CAD. Nuclear study 8/14 showed EF 61 and normal perfusion. Echo 11/18 [facility]) showed normal LV function. Recently DCed following admission for pneumonia. Patient was told she had congestive heart failure at [facility]. She has been treated with diuretics. Laboratories January 6 shod potassium 4.7, BUN 48 and creatinine 1.12. Patient has chronic dyspnea but denies orthopnea, PND or chest pain. She has had pedal edema. She denies fevers, chills or productive cough since discharge.  Current Outpatient Medications  Medication Sig Dispense Refill  . allopurinol (ZYLOPRIM) 300 MG tablet Take 300 mg by mouth daily.    Marland Kitchen docusate sodium (COLACE) 100 MG capsule Take 100 mg by mouth every 12 (twelve) hours.    Marland Kitchen esomeprazole (NEXIUM) 40 MG capsule Take 40 mg by mouth daily.    . furosemide (LASIX) 20 MG tablet Take 20-60 mg by mouth daily as needed. 60mg  in the morning, 20 mg in the evening  5  . guaiFENesin-dextromethorphan (ROBITUSSIN DM) 100-10 MG/5ML syrup Take 5 mLs by mouth every 4 (four) hours as needed for cough. 118 mL 0  . insulin NPH-regular Human (NOVOLIN 70/30) (70-30) 100 UNIT/ML injection 45 units in the am and 50 units at supper    . iron polysaccharides (NIFEREX) 150 MG capsule Take 150 mg by mouth daily.    Marland Kitchen lisinopril (PRINIVIL,ZESTRIL) 5 MG tablet Take 5 mg by mouth daily.    . metFORMIN (GLUCOPHAGE) 1000 MG tablet Take 1,000 mg by mouth 2 (two) times daily with a meal.     . Multiple Vitamins-Minerals (PRESERVISION AREDS 2+MULTI VIT PO) Take 1 capsule by mouth daily.    . simvastatin (ZOCOR) 80 MG tablet Take 40 mg by mouth at bedtime.     . VENTOLIN HFA 108 (90 Base) MCG/ACT inhaler Take 2 puffs by mouth every 4 (four) hours as needed. for wheezing  0   No current facility-administered  medications for this visit.     Allergies  Allergen Reactions  . Penicillins Other (See Comments)    Has patient had a PCN reaction causing immediate rash, facial/tongue/throat swelling, SOB or lightheadedness with hypotension: No Has patient had a PCN reaction causing severe rash involving mucus membranes or skin necrosis: No Has patient had a PCN reaction that required hospitalization: No Has patient had a PCN reaction occurring within the last 10 years: Unknown If all of the above answers are "NO", then may proceed with Cephalosporin use.     Past Medical History:  Diagnosis Date  . Adrenal adenoma   . Anemia   . Arthritis    OA AND PAIN BOTH KNEES BUT RIGHT KNEE PAIN WORSE; SOME LOWER BACK PAIN  . Cellulitis and abscess of trunk   . Colon polyp    adenomatous  . Diabetes ([place])    type 2  PT IS ON ORAL MEDICATION AND INSULIN  . Diverticulitis   . GERD (gastroesophageal reflux disease)    NO MEDS FOR GERD  . Goiter   . Gout   . Hepatitis    YELLOW JAUNDICE WHEN A CHILD  . Hernia   . Hyperlipemia   . Hypertension   . Nephrolithiasis   . 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 Obesity   . Open wound of abdominal wall, anterior, without mention of complication   .  Pneumonia    IN THE PAST  . Retinopathy due to secondary diabetes mellitus ([place])   . Shortness of breath    PT IS NOT ABLE TO BE ACTIVE BECAUSE OF KNEE PROBLEM; HAS ALWAYS HAD PROBLEM WITH BREATHING HEAVY - DOES GET SOB WITH EXERTION.  Marland Kitchen Thalassemia minor     Past Surgical History:  Procedure Laterality Date  . APPENDECTOMY     1993 PT HAD APPENDECTOMY AND ABDOMINAL SURGERY FRO DIVERTICULITS  . CARDIAC CATHETERIZATION  2008   conclusion: smooth and normal coronary arteries, normal left ventricular systolic function  . CHOLECYSTECTOMY    . COLONOSCOPY  10/04/06  . CYSTOSCOPY WITH BIOPSY N/A 06/16/2016   Procedure: CYSTOSCOPY WITH  BLADDER BIOPSY AND FULGERATION 1CM;  Surgeon: Festus Aloe, MD;  Location: [facility];  Service: Urology;  Laterality: N/A;  . DIAGNOSTIC MAMMOGRAM  03/17/2011  . ROTATOR CUFF REPAIR Left   . SIGMOID RESECTION / RECTOPEXY    . TONSILLECTOMY    . TOTAL ABDOMINAL HYSTERECTOMY    . TOTAL KNEE ARTHROPLASTY Right 08/11/2013   Procedure: RIGHT TOTAL KNEE ARTHROPLASTY;  Surgeon: Mauri Pole, MD;  Location: [facility];  Service: Orthopedics;  Laterality: Right;  . VENTRAL HERNIA REPAIR  2007    Social History   Socioeconomic History  . Marital status: Married    Spouse name: Not on file  . Number of children: 6  . Years of education: Not on file  . Highest education level: Not on file  Social Needs  . Financial resource strain: Not on file  . Food insecurity - worry: Not on file  . Food insecurity - inability: Not on file  . Transportation needs - medical: Not on file  . Transportation needs - non-medical: Not on file  Occupational History  . Not on file  Tobacco Use  . Smoking status: Never Smoker  . Smokeless tobacco: Never Used  Substance and Sexual Activity  . Alcohol use: No    Alcohol/week: 0.0 oz  . Drug use: No  . Sexual activity: Not Currently    Partners: Male    Birth control/protection: Post-menopausal  Other Topics Concern  . Not on file  Social History Narrative  . Not on file    Family History  Problem Relation Age of Onset  . Diabetes Father   . Hypertension Father   . Kidney disease Father   . Liver disease Mother   . Hypertension Unknown   . Rheum arthritis Sister   . Diverticulitis Sister   . Hypertension Sister   . Colon cancer Neg Hx   . Colon polyps Neg Hx   . Esophageal cancer Neg Hx   . Pancreatic cancer Neg Hx   . Stomach cancer Neg Hx     ROS: no fevers or chills, productive cough, hemoptysis, dysphasia, odynophagia, melena, hematochezia, dysuria, hematuria, rash, seizure activity, claudication. Remaining systems are negative.  Physical Exam:   Blood pressure 100/60, pulse 90, height 5\' 2"   (1.575 m), weight 245 lb (111.1 kg).  General:  Well developed/morbidly obese in NAD Skin warm/dry Patient not depressed No peripheral clubbing Back-normal HEENT-normal/normal eyelids Neck supple/normal carotid upstroke bilaterally; no bruits; no JVD; no thyromegaly chest - CTA/ normal expansion CV - RRR/normal S1 and S2; no murmurs, rubs or gallops;  PMI nondisplaced Abdomen -NT/ND, no HSM, no mass, + bowel sounds, no bruit 2+ femoral pulses, no bruits Ext-2+ edema, no chords, diminshed distal pulses Neuro-grossly nonfocal  ECG - 01/12/2017-sinus rhythm, cannot rule out  prior septal infarct. personally reviewed  A/P  1 chronic diastolic congestive heart failure-patient likely has some degree of right side congestive heart failure from obesity hypoventilation syndrome and sleep apnea. Recent echocardiogram showed normal LV systolic function. Plan to continue present dose of Lasix. I am hesitant to advance as she is prerenal on most recent laboratories. I have asked her to keep her feet elevated and she will continue with compression hose and wrapping. I will ask pulmonary to evaluate for possible sleep apnea and therapy as indicated.  2 morbid obesity-patient needs weight loss.  3 hypertension-blood pressure is controlled. Continue present medications.  4 hyperlipidemia-continue statin.  5 diabetes mellitus-per primary care.  Kirk Ruths, MD

## 2017-01-17 DIAGNOSIS — I10 Essential (primary) hypertension: Secondary | ICD-10-CM | POA: Diagnosis not present

## 2017-01-17 DIAGNOSIS — M47816 Spondylosis without myelopathy or radiculopathy, lumbar region: Secondary | ICD-10-CM | POA: Diagnosis not present

## 2017-01-17 DIAGNOSIS — M6281 Muscle weakness (generalized): Secondary | ICD-10-CM | POA: Diagnosis not present

## 2017-01-17 DIAGNOSIS — J9601 Acute respiratory failure with hypoxia: Secondary | ICD-10-CM | POA: Diagnosis not present

## 2017-01-17 DIAGNOSIS — F0390 Unspecified dementia without behavioral disturbance: Secondary | ICD-10-CM | POA: Diagnosis not present

## 2017-01-17 DIAGNOSIS — I872 Venous insufficiency (chronic) (peripheral): Secondary | ICD-10-CM | POA: Diagnosis not present

## 2017-01-17 DIAGNOSIS — H3530 Unspecified macular degeneration: Secondary | ICD-10-CM | POA: Diagnosis not present

## 2017-01-17 DIAGNOSIS — E11649 Type 2 diabetes mellitus with hypoglycemia without coma: Secondary | ICD-10-CM | POA: Diagnosis not present

## 2017-01-18 DIAGNOSIS — H3530 Unspecified macular degeneration: Secondary | ICD-10-CM | POA: Diagnosis not present

## 2017-01-18 DIAGNOSIS — E11649 Type 2 diabetes mellitus with hypoglycemia without coma: Secondary | ICD-10-CM | POA: Diagnosis not present

## 2017-01-18 DIAGNOSIS — F0390 Unspecified dementia without behavioral disturbance: Secondary | ICD-10-CM | POA: Diagnosis not present

## 2017-01-18 DIAGNOSIS — I10 Essential (primary) hypertension: Secondary | ICD-10-CM | POA: Diagnosis not present

## 2017-01-18 DIAGNOSIS — M6281 Muscle weakness (generalized): Secondary | ICD-10-CM | POA: Diagnosis not present

## 2017-01-18 DIAGNOSIS — I872 Venous insufficiency (chronic) (peripheral): Secondary | ICD-10-CM | POA: Diagnosis not present

## 2017-01-18 DIAGNOSIS — J9601 Acute respiratory failure with hypoxia: Secondary | ICD-10-CM | POA: Diagnosis not present

## 2017-01-18 DIAGNOSIS — M47816 Spondylosis without myelopathy or radiculopathy, lumbar region: Secondary | ICD-10-CM | POA: Diagnosis not present

## 2017-01-18 LAB — CULTURE, BLOOD (ROUTINE X 2)
CULTURE: NO GROWTH
CULTURE: NO GROWTH
SPECIAL REQUESTS: ADEQUATE
Special Requests: ADEQUATE

## 2017-01-19 DIAGNOSIS — M47816 Spondylosis without myelopathy or radiculopathy, lumbar region: Secondary | ICD-10-CM | POA: Diagnosis not present

## 2017-01-19 DIAGNOSIS — H3530 Unspecified macular degeneration: Secondary | ICD-10-CM | POA: Diagnosis not present

## 2017-01-19 DIAGNOSIS — J9601 Acute respiratory failure with hypoxia: Secondary | ICD-10-CM | POA: Diagnosis not present

## 2017-01-19 DIAGNOSIS — I872 Venous insufficiency (chronic) (peripheral): Secondary | ICD-10-CM | POA: Diagnosis not present

## 2017-01-19 DIAGNOSIS — F0390 Unspecified dementia without behavioral disturbance: Secondary | ICD-10-CM | POA: Diagnosis not present

## 2017-01-19 DIAGNOSIS — I10 Essential (primary) hypertension: Secondary | ICD-10-CM | POA: Diagnosis not present

## 2017-01-19 DIAGNOSIS — M6281 Muscle weakness (generalized): Secondary | ICD-10-CM | POA: Diagnosis not present

## 2017-01-19 DIAGNOSIS — E11649 Type 2 diabetes mellitus with hypoglycemia without coma: Secondary | ICD-10-CM | POA: Diagnosis not present

## 2017-01-22 ENCOUNTER — Ambulatory Visit: Payer: Medicare HMO | Admitting: Cardiology

## 2017-01-22 ENCOUNTER — Other Ambulatory Visit: Payer: Self-pay

## 2017-01-22 ENCOUNTER — Encounter: Payer: Self-pay | Admitting: Cardiology

## 2017-01-22 VITALS — BP 100/60 | HR 90 | Ht 62.0 in | Wt 245.0 lb

## 2017-01-22 DIAGNOSIS — I5032 Chronic diastolic (congestive) heart failure: Secondary | ICD-10-CM

## 2017-01-22 DIAGNOSIS — R0683 Snoring: Secondary | ICD-10-CM | POA: Diagnosis not present

## 2017-01-22 DIAGNOSIS — I10 Essential (primary) hypertension: Secondary | ICD-10-CM

## 2017-01-22 NOTE — Patient Instructions (Signed)
Your physician recommends that you schedule a follow-up appointment in: [place] TO PULMONARY IN [place] FOR EVAL SLEEP APNEA

## 2017-01-22 NOTE — Patient Outreach (Signed)
[facility]) Care Management  01/22/2017  Lauren Crosby [birth year] [id number]  Transition of care  Referral date: 01/19/17 Referral source: discharged from [facility] 01/15/17 Insurance: [employer] Attempt #1  Telephone call to patient regarding transition of care follow up. Unable to reach patient. HIPAA compliant voice message left with call back phone number.   PLAN: RNCM will attempt 2nd telephone call to patient within 3 business days.   Quinn Plowman RN,BSN,CCM [facility] Telephonic  [phone]

## 2017-01-23 ENCOUNTER — Other Ambulatory Visit: Payer: Self-pay

## 2017-01-23 DIAGNOSIS — I509 Heart failure, unspecified: Secondary | ICD-10-CM | POA: Diagnosis not present

## 2017-01-23 NOTE — Patient Outreach (Signed)
[facility]) Care Management  01/23/2017  Lauren Crosby 04/08/1931 [id number]  Transition of care  Referral date: 01/19/17 Referral source: discharged from hospital 01/15/17 Insurance: [employer]  Telephone call to patient regarding transition of care follow up. HIPAA verified with patient. Patient request RNCM speak with her husband, Lauren Crosby regarding her health information. Discussed transition of care program with patients husband. Husband states patient was admitted to the hospital for pneumonia in December 2018 then went to a skilled nursing facility for rehab.  Husband states patient developed pneumonia again and was hospitalized from 01/12/17 to 01/15/17.   Husband states patient saw her cardiologist on yesterday 01/22/17 and had a follow up appointment with her primary MD today.  Husband states he provides patient transportation to her appointments.  Husband denies any change in patients treatment plan.  Husband states patient is receiving home health services from [place].  Husband states patient has completed her antibiotic and prednisone course.  Husban states patient has her medications and takes them as prescribed. States patient has been doing, "pretty good" today and has not required her emergency inhaler. Husband states patient is still very weak.  RMCM advised patient/ husband to keep scheduled follow up appointments and take medication as prescribed.  RNCM discussed pneumonia symptoms with husband.  RNCM advised husband  to notify patients  MD of any changes in condition prior to scheduled appointment. RNCM provided contact name and number: [phone] or main office number [phone] and 24 hour nurse advise line [phone].  RNCM verified patient husband aware of 911 services for urgent/ emergent needs. Husband refused ongoing transition of care follow up for patient. Husband states, " patient is having a lot of services now and gets  pretty tired." Husband verbally agreed to received [facility] care management letter/ brochure for future reference.   PLAN;  RNCM will refer patient to care management assistant to close due to refusal of services  RNCM will send patients primary MD closure notification. RNCM will send patient [facility] care management outreach letter/ brochure as discussed.   Quinn Plowman RN,BSN,CCM [facility] Telephonic  [phone]         PLAN:

## 2017-01-24 DIAGNOSIS — H3530 Unspecified macular degeneration: Secondary | ICD-10-CM | POA: Diagnosis not present

## 2017-01-24 DIAGNOSIS — I872 Venous insufficiency (chronic) (peripheral): Secondary | ICD-10-CM | POA: Diagnosis not present

## 2017-01-24 DIAGNOSIS — M6281 Muscle weakness (generalized): Secondary | ICD-10-CM | POA: Diagnosis not present

## 2017-01-24 DIAGNOSIS — I10 Essential (primary) hypertension: Secondary | ICD-10-CM | POA: Diagnosis not present

## 2017-01-24 DIAGNOSIS — E11649 Type 2 diabetes mellitus with hypoglycemia without coma: Secondary | ICD-10-CM | POA: Diagnosis not present

## 2017-01-24 DIAGNOSIS — J9601 Acute respiratory failure with hypoxia: Secondary | ICD-10-CM | POA: Diagnosis not present

## 2017-01-24 DIAGNOSIS — M47816 Spondylosis without myelopathy or radiculopathy, lumbar region: Secondary | ICD-10-CM | POA: Diagnosis not present

## 2017-01-24 DIAGNOSIS — F0390 Unspecified dementia without behavioral disturbance: Secondary | ICD-10-CM | POA: Diagnosis not present

## 2017-01-26 DIAGNOSIS — M47816 Spondylosis without myelopathy or radiculopathy, lumbar region: Secondary | ICD-10-CM | POA: Diagnosis not present

## 2017-01-26 DIAGNOSIS — M6281 Muscle weakness (generalized): Secondary | ICD-10-CM | POA: Diagnosis not present

## 2017-01-26 DIAGNOSIS — I872 Venous insufficiency (chronic) (peripheral): Secondary | ICD-10-CM | POA: Diagnosis not present

## 2017-01-26 DIAGNOSIS — H3530 Unspecified macular degeneration: Secondary | ICD-10-CM | POA: Diagnosis not present

## 2017-01-26 DIAGNOSIS — F0390 Unspecified dementia without behavioral disturbance: Secondary | ICD-10-CM | POA: Diagnosis not present

## 2017-01-26 DIAGNOSIS — J9601 Acute respiratory failure with hypoxia: Secondary | ICD-10-CM | POA: Diagnosis not present

## 2017-01-26 DIAGNOSIS — E11649 Type 2 diabetes mellitus with hypoglycemia without coma: Secondary | ICD-10-CM | POA: Diagnosis not present

## 2017-01-26 DIAGNOSIS — I10 Essential (primary) hypertension: Secondary | ICD-10-CM | POA: Diagnosis not present

## 2017-01-29 DIAGNOSIS — H3530 Unspecified macular degeneration: Secondary | ICD-10-CM | POA: Diagnosis not present

## 2017-01-29 DIAGNOSIS — I872 Venous insufficiency (chronic) (peripheral): Secondary | ICD-10-CM | POA: Diagnosis not present

## 2017-01-29 DIAGNOSIS — I10 Essential (primary) hypertension: Secondary | ICD-10-CM | POA: Diagnosis not present

## 2017-01-29 DIAGNOSIS — E11649 Type 2 diabetes mellitus with hypoglycemia without coma: Secondary | ICD-10-CM | POA: Diagnosis not present

## 2017-01-29 DIAGNOSIS — M6281 Muscle weakness (generalized): Secondary | ICD-10-CM | POA: Diagnosis not present

## 2017-01-29 DIAGNOSIS — F0390 Unspecified dementia without behavioral disturbance: Secondary | ICD-10-CM | POA: Diagnosis not present

## 2017-01-29 DIAGNOSIS — J9601 Acute respiratory failure with hypoxia: Secondary | ICD-10-CM | POA: Diagnosis not present

## 2017-01-29 DIAGNOSIS — M47816 Spondylosis without myelopathy or radiculopathy, lumbar region: Secondary | ICD-10-CM | POA: Diagnosis not present

## 2017-01-30 DIAGNOSIS — E11649 Type 2 diabetes mellitus with hypoglycemia without coma: Secondary | ICD-10-CM | POA: Diagnosis not present

## 2017-01-30 DIAGNOSIS — F0390 Unspecified dementia without behavioral disturbance: Secondary | ICD-10-CM | POA: Diagnosis not present

## 2017-01-30 DIAGNOSIS — H3530 Unspecified macular degeneration: Secondary | ICD-10-CM | POA: Diagnosis not present

## 2017-01-30 DIAGNOSIS — M47816 Spondylosis without myelopathy or radiculopathy, lumbar region: Secondary | ICD-10-CM | POA: Diagnosis not present

## 2017-01-30 DIAGNOSIS — I10 Essential (primary) hypertension: Secondary | ICD-10-CM | POA: Diagnosis not present

## 2017-01-30 DIAGNOSIS — M6281 Muscle weakness (generalized): Secondary | ICD-10-CM | POA: Diagnosis not present

## 2017-01-30 DIAGNOSIS — J9601 Acute respiratory failure with hypoxia: Secondary | ICD-10-CM | POA: Diagnosis not present

## 2017-01-30 DIAGNOSIS — I872 Venous insufficiency (chronic) (peripheral): Secondary | ICD-10-CM | POA: Diagnosis not present

## 2017-01-31 DIAGNOSIS — M47816 Spondylosis without myelopathy or radiculopathy, lumbar region: Secondary | ICD-10-CM | POA: Diagnosis not present

## 2017-01-31 DIAGNOSIS — M6281 Muscle weakness (generalized): Secondary | ICD-10-CM | POA: Diagnosis not present

## 2017-01-31 DIAGNOSIS — E11649 Type 2 diabetes mellitus with hypoglycemia without coma: Secondary | ICD-10-CM | POA: Diagnosis not present

## 2017-01-31 DIAGNOSIS — F0390 Unspecified dementia without behavioral disturbance: Secondary | ICD-10-CM | POA: Diagnosis not present

## 2017-01-31 DIAGNOSIS — H3530 Unspecified macular degeneration: Secondary | ICD-10-CM | POA: Diagnosis not present

## 2017-01-31 DIAGNOSIS — I10 Essential (primary) hypertension: Secondary | ICD-10-CM | POA: Diagnosis not present

## 2017-01-31 DIAGNOSIS — I872 Venous insufficiency (chronic) (peripheral): Secondary | ICD-10-CM | POA: Diagnosis not present

## 2017-01-31 DIAGNOSIS — J9601 Acute respiratory failure with hypoxia: Secondary | ICD-10-CM | POA: Diagnosis not present

## 2017-02-01 DIAGNOSIS — F0390 Unspecified dementia without behavioral disturbance: Secondary | ICD-10-CM | POA: Diagnosis not present

## 2017-02-01 DIAGNOSIS — J9601 Acute respiratory failure with hypoxia: Secondary | ICD-10-CM | POA: Diagnosis not present

## 2017-02-01 DIAGNOSIS — M47816 Spondylosis without myelopathy or radiculopathy, lumbar region: Secondary | ICD-10-CM | POA: Diagnosis not present

## 2017-02-01 DIAGNOSIS — M6281 Muscle weakness (generalized): Secondary | ICD-10-CM | POA: Diagnosis not present

## 2017-02-01 DIAGNOSIS — H3530 Unspecified macular degeneration: Secondary | ICD-10-CM | POA: Diagnosis not present

## 2017-02-01 DIAGNOSIS — I872 Venous insufficiency (chronic) (peripheral): Secondary | ICD-10-CM | POA: Diagnosis not present

## 2017-02-01 DIAGNOSIS — I10 Essential (primary) hypertension: Secondary | ICD-10-CM | POA: Diagnosis not present

## 2017-02-01 DIAGNOSIS — E11649 Type 2 diabetes mellitus with hypoglycemia without coma: Secondary | ICD-10-CM | POA: Diagnosis not present

## 2017-02-06 DIAGNOSIS — E11649 Type 2 diabetes mellitus with hypoglycemia without coma: Secondary | ICD-10-CM | POA: Diagnosis not present

## 2017-02-06 DIAGNOSIS — J9601 Acute respiratory failure with hypoxia: Secondary | ICD-10-CM | POA: Diagnosis not present

## 2017-02-06 DIAGNOSIS — F0390 Unspecified dementia without behavioral disturbance: Secondary | ICD-10-CM | POA: Diagnosis not present

## 2017-02-06 DIAGNOSIS — M6281 Muscle weakness (generalized): Secondary | ICD-10-CM | POA: Diagnosis not present

## 2017-02-06 DIAGNOSIS — I10 Essential (primary) hypertension: Secondary | ICD-10-CM | POA: Diagnosis not present

## 2017-02-06 DIAGNOSIS — H3530 Unspecified macular degeneration: Secondary | ICD-10-CM | POA: Diagnosis not present

## 2017-02-06 DIAGNOSIS — M47816 Spondylosis without myelopathy or radiculopathy, lumbar region: Secondary | ICD-10-CM | POA: Diagnosis not present

## 2017-02-06 DIAGNOSIS — I872 Venous insufficiency (chronic) (peripheral): Secondary | ICD-10-CM | POA: Diagnosis not present

## 2017-02-07 ENCOUNTER — Encounter (HOSPITAL_COMMUNITY): Payer: Medicare HMO

## 2017-02-07 DIAGNOSIS — I872 Venous insufficiency (chronic) (peripheral): Secondary | ICD-10-CM | POA: Diagnosis not present

## 2017-02-07 DIAGNOSIS — N08 Glomerular disorders in diseases classified elsewhere: Secondary | ICD-10-CM | POA: Diagnosis not present

## 2017-02-07 DIAGNOSIS — R2681 Unsteadiness on feet: Secondary | ICD-10-CM | POA: Diagnosis not present

## 2017-02-07 DIAGNOSIS — D649 Anemia, unspecified: Secondary | ICD-10-CM | POA: Diagnosis not present

## 2017-02-07 DIAGNOSIS — I10 Essential (primary) hypertension: Secondary | ICD-10-CM | POA: Diagnosis not present

## 2017-02-07 DIAGNOSIS — M6281 Muscle weakness (generalized): Secondary | ICD-10-CM | POA: Diagnosis not present

## 2017-02-07 DIAGNOSIS — H3530 Unspecified macular degeneration: Secondary | ICD-10-CM | POA: Diagnosis not present

## 2017-02-07 DIAGNOSIS — M47816 Spondylosis without myelopathy or radiculopathy, lumbar region: Secondary | ICD-10-CM | POA: Diagnosis not present

## 2017-02-07 DIAGNOSIS — J9601 Acute respiratory failure with hypoxia: Secondary | ICD-10-CM | POA: Diagnosis not present

## 2017-02-07 DIAGNOSIS — R5381 Other malaise: Secondary | ICD-10-CM | POA: Diagnosis not present

## 2017-02-07 DIAGNOSIS — E1129 Type 2 diabetes mellitus with other diabetic kidney complication: Secondary | ICD-10-CM | POA: Diagnosis not present

## 2017-02-07 DIAGNOSIS — E048 Other specified nontoxic goiter: Secondary | ICD-10-CM | POA: Diagnosis not present

## 2017-02-07 DIAGNOSIS — E7849 Other hyperlipidemia: Secondary | ICD-10-CM | POA: Diagnosis not present

## 2017-02-07 DIAGNOSIS — D6489 Other specified anemias: Secondary | ICD-10-CM | POA: Diagnosis not present

## 2017-02-07 DIAGNOSIS — C679 Malignant neoplasm of bladder, unspecified: Secondary | ICD-10-CM | POA: Diagnosis not present

## 2017-02-07 DIAGNOSIS — E11649 Type 2 diabetes mellitus with hypoglycemia without coma: Secondary | ICD-10-CM | POA: Diagnosis not present

## 2017-02-07 DIAGNOSIS — F0390 Unspecified dementia without behavioral disturbance: Secondary | ICD-10-CM | POA: Diagnosis not present

## 2017-02-08 ENCOUNTER — Other Ambulatory Visit (HOSPITAL_COMMUNITY): Payer: Self-pay

## 2017-02-08 DIAGNOSIS — I872 Venous insufficiency (chronic) (peripheral): Secondary | ICD-10-CM | POA: Diagnosis not present

## 2017-02-08 DIAGNOSIS — I10 Essential (primary) hypertension: Secondary | ICD-10-CM | POA: Diagnosis not present

## 2017-02-08 DIAGNOSIS — M47816 Spondylosis without myelopathy or radiculopathy, lumbar region: Secondary | ICD-10-CM | POA: Diagnosis not present

## 2017-02-08 DIAGNOSIS — J9601 Acute respiratory failure with hypoxia: Secondary | ICD-10-CM | POA: Diagnosis not present

## 2017-02-08 DIAGNOSIS — E11649 Type 2 diabetes mellitus with hypoglycemia without coma: Secondary | ICD-10-CM | POA: Diagnosis not present

## 2017-02-08 DIAGNOSIS — H3530 Unspecified macular degeneration: Secondary | ICD-10-CM | POA: Diagnosis not present

## 2017-02-08 DIAGNOSIS — F0390 Unspecified dementia without behavioral disturbance: Secondary | ICD-10-CM | POA: Diagnosis not present

## 2017-02-08 DIAGNOSIS — M6281 Muscle weakness (generalized): Secondary | ICD-10-CM | POA: Diagnosis not present

## 2017-02-09 ENCOUNTER — Ambulatory Visit (HOSPITAL_COMMUNITY)
Admission: RE | Admit: 2017-02-09 | Discharge: 2017-02-09 | Disposition: A | Discharge status: Home / Self Care | Payer: Medicare HMO | Source: Ambulatory Visit | Attending: Endocrinology | Admitting: Endocrinology

## 2017-02-09 ENCOUNTER — Encounter (HOSPITAL_COMMUNITY): Payer: Self-pay

## 2017-02-09 DIAGNOSIS — H3530 Unspecified macular degeneration: Secondary | ICD-10-CM | POA: Diagnosis not present

## 2017-02-09 DIAGNOSIS — I872 Venous insufficiency (chronic) (peripheral): Secondary | ICD-10-CM | POA: Diagnosis not present

## 2017-02-09 DIAGNOSIS — E11649 Type 2 diabetes mellitus with hypoglycemia without coma: Secondary | ICD-10-CM | POA: Diagnosis not present

## 2017-02-09 DIAGNOSIS — M6281 Muscle weakness (generalized): Secondary | ICD-10-CM | POA: Diagnosis not present

## 2017-02-09 DIAGNOSIS — F0390 Unspecified dementia without behavioral disturbance: Secondary | ICD-10-CM | POA: Diagnosis not present

## 2017-02-09 DIAGNOSIS — I10 Essential (primary) hypertension: Secondary | ICD-10-CM | POA: Diagnosis not present

## 2017-02-09 DIAGNOSIS — D649 Anemia, unspecified: Secondary | ICD-10-CM | POA: Insufficient documentation

## 2017-02-09 DIAGNOSIS — M47816 Spondylosis without myelopathy or radiculopathy, lumbar region: Secondary | ICD-10-CM | POA: Diagnosis not present

## 2017-02-09 DIAGNOSIS — J9601 Acute respiratory failure with hypoxia: Secondary | ICD-10-CM | POA: Diagnosis not present

## 2017-02-09 MED ORDER — SODIUM CHLORIDE 0.9 % IV SOLN
510.0000 mg | Freq: Once | INTRAVENOUS | Status: AC
  Administered 2017-02-09: 09:00:00 510 mg via INTRAVENOUS
  Filled 2017-02-09: qty 17

## 2017-02-12 DIAGNOSIS — N179 Acute kidney failure, unspecified: Secondary | ICD-10-CM | POA: Diagnosis not present

## 2017-02-12 DIAGNOSIS — J189 Pneumonia, unspecified organism: Secondary | ICD-10-CM | POA: Diagnosis not present

## 2017-02-12 DIAGNOSIS — R5381 Other malaise: Secondary | ICD-10-CM | POA: Diagnosis not present

## 2017-02-12 DIAGNOSIS — D6489 Other specified anemias: Secondary | ICD-10-CM | POA: Diagnosis not present

## 2017-02-12 DIAGNOSIS — I509 Heart failure, unspecified: Secondary | ICD-10-CM | POA: Diagnosis not present

## 2017-02-12 DIAGNOSIS — E1129 Type 2 diabetes mellitus with other diabetic kidney complication: Secondary | ICD-10-CM | POA: Diagnosis not present

## 2017-02-12 DIAGNOSIS — E7849 Other hyperlipidemia: Secondary | ICD-10-CM | POA: Diagnosis not present

## 2017-02-15 DIAGNOSIS — I10 Essential (primary) hypertension: Secondary | ICD-10-CM | POA: Diagnosis not present

## 2017-02-15 DIAGNOSIS — M6281 Muscle weakness (generalized): Secondary | ICD-10-CM | POA: Diagnosis not present

## 2017-02-15 DIAGNOSIS — E11649 Type 2 diabetes mellitus with hypoglycemia without coma: Secondary | ICD-10-CM | POA: Diagnosis not present

## 2017-02-15 DIAGNOSIS — J9601 Acute respiratory failure with hypoxia: Secondary | ICD-10-CM | POA: Diagnosis not present

## 2017-02-15 DIAGNOSIS — M47816 Spondylosis without myelopathy or radiculopathy, lumbar region: Secondary | ICD-10-CM | POA: Diagnosis not present

## 2017-02-15 DIAGNOSIS — I872 Venous insufficiency (chronic) (peripheral): Secondary | ICD-10-CM | POA: Diagnosis not present

## 2017-02-15 DIAGNOSIS — F0390 Unspecified dementia without behavioral disturbance: Secondary | ICD-10-CM | POA: Diagnosis not present

## 2017-02-15 DIAGNOSIS — H3530 Unspecified macular degeneration: Secondary | ICD-10-CM | POA: Diagnosis not present

## 2017-02-16 DIAGNOSIS — E11649 Type 2 diabetes mellitus with hypoglycemia without coma: Secondary | ICD-10-CM | POA: Diagnosis not present

## 2017-02-16 DIAGNOSIS — M6281 Muscle weakness (generalized): Secondary | ICD-10-CM | POA: Diagnosis not present

## 2017-02-16 DIAGNOSIS — J9601 Acute respiratory failure with hypoxia: Secondary | ICD-10-CM | POA: Diagnosis not present

## 2017-02-16 DIAGNOSIS — I10 Essential (primary) hypertension: Secondary | ICD-10-CM | POA: Diagnosis not present

## 2017-02-16 DIAGNOSIS — M47816 Spondylosis without myelopathy or radiculopathy, lumbar region: Secondary | ICD-10-CM | POA: Diagnosis not present

## 2017-02-16 DIAGNOSIS — F0390 Unspecified dementia without behavioral disturbance: Secondary | ICD-10-CM | POA: Diagnosis not present

## 2017-02-16 DIAGNOSIS — I872 Venous insufficiency (chronic) (peripheral): Secondary | ICD-10-CM | POA: Diagnosis not present

## 2017-02-16 DIAGNOSIS — H3530 Unspecified macular degeneration: Secondary | ICD-10-CM | POA: Diagnosis not present

## 2017-02-27 DIAGNOSIS — I509 Heart failure, unspecified: Secondary | ICD-10-CM | POA: Diagnosis not present

## 2017-02-27 DIAGNOSIS — R8290 Unspecified abnormal findings in urine: Secondary | ICD-10-CM | POA: Diagnosis not present

## 2017-02-27 DIAGNOSIS — R600 Localized edema: Secondary | ICD-10-CM | POA: Diagnosis not present

## 2017-02-27 DIAGNOSIS — E1129 Type 2 diabetes mellitus with other diabetic kidney complication: Secondary | ICD-10-CM | POA: Diagnosis not present

## 2017-02-27 DIAGNOSIS — D6489 Other specified anemias: Secondary | ICD-10-CM | POA: Diagnosis not present

## 2017-02-27 DIAGNOSIS — I872 Venous insufficiency (chronic) (peripheral): Secondary | ICD-10-CM | POA: Diagnosis not present

## 2017-02-27 DIAGNOSIS — I10 Essential (primary) hypertension: Secondary | ICD-10-CM | POA: Diagnosis not present

## 2017-03-01 ENCOUNTER — Ambulatory Visit (INDEPENDENT_AMBULATORY_CARE_PROVIDER_SITE_OTHER): Payer: Medicare HMO | Admitting: Pulmonary Disease

## 2017-03-01 ENCOUNTER — Encounter: Payer: Self-pay | Admitting: Pulmonary Disease

## 2017-03-01 VITALS — BP 118/72 | HR 94 | Ht 62.0 in | Wt 248.0 lb

## 2017-03-01 DIAGNOSIS — R29818 Other symptoms and signs involving the nervous system: Secondary | ICD-10-CM | POA: Diagnosis not present

## 2017-03-01 DIAGNOSIS — Z6841 Body Mass Index (BMI) 40.0 and over, adult: Secondary | ICD-10-CM

## 2017-03-01 NOTE — Patient Instructions (Signed)
Will arrange for home sleep study Will call to arrange for follow up after sleep study reviewed  

## 2017-03-01 NOTE — Progress Notes (Signed)
[facility]  Chief Complaint  Patient presents with  . sleep consult    Pt referred by Dr. Jene Every MD. Pt's legs are swollen, hurt to walk and painful. Pt not sleeping well at night, SOB at rest and more in the morning.    Vital signs: BP 118/72 (BP Location: Left Arm, Cuff Size: Normal)   Pulse 94   Ht 5\' 2"  (1.575 m)   Wt 248 lb (112.5 kg)   SpO2 96%   BMI 45.36 kg/m   History of Present Illness: Lauren Crosby is a 84 y.o. female for evaluation of sleep problems.  She was seen recently by Dr. Stanford Breed with cardiology for diastolic CHF.  There was concern she could have sleep disordered breathing as a contributor to her hypertension and heart failure.  Her husband says she snores.  She has trouble sleeping on her back.  She gets pains in her legs at night related to swelling.  She gets sleepy during the day, and falls asleep while watching TV.  She goes to sleep at 11 pm.  She falls asleep after 1 hour.  She wakes up 1 or 2 times to use the bathroom.  She gets out of bed at 630 am.  She feels okay in the morning.  She denies morning headache.  She does not use anything to help her fall sleep.  She drinks a cup of coffee in the morning.  She denies sleep walking, sleep talking, bruxism, or nightmares.  There is no history of restless legs.  She denies sleep hallucinations, sleep paralysis, or cataplexy.  Physical Exam:  General - pleasant Eyes - pupils reactive ENT - no sinus tenderness, no oral exudate, no LAN, MP 3, wears dentures Cardiac - regular, no murmur Chest - no wheeze, rales Abd - soft, non tender Ext - 2+ non pitting leg edema Skin - no rashes Neuro - normal strength Psych - normal mood  CXR 01/12/17 >> cardiomegaly, atelectasis  Discussion: She has snoring, sleep disruption and daytime sleepiness.  She has family history of sleep apnea.  She has history of diastolic CHF, HTN, and DM.  I am concerned she could have  sleep apnea.  We discussed how sleep apnea can affect various health problems, including risks for hypertension, cardiovascular disease, and diabetes.  We also discussed how sleep disruption can increase risks for accidents, such as while driving.  Weight loss as a means of improving sleep apnea was also reviewed.  Additional treatment options discussed were CPAP therapy, oral appliance, and surgical intervention.  Assessment/Plan:  Suspected sleep apnea. - will arrange for home sleep study  Obesity. - discussed the importance of weight loss   Patient Instructions  Will arrange for home sleep study Will call to arrange for follow up after sleep study reviewed     Chesley Mires, MD [place] 03/01/2017, 10:52 AM Pager:  [phone]  Flow Sheet  Pulmonary tests: PFT 10/22/09 >> FEV1 2.03 (114%), FEV1% 85, TLC 4.22 (93%), DLCO 54%, no BD  Sleep tests:  Cardiac tests: Echo 11/28/16 >> EF 55 to 60%  Review of Systems: Constitutional: Negative for fever and unexpected weight change.  HENT: Positive for trouble swallowing. Negative for congestion, dental problem, ear pain, nosebleeds, postnasal drip, rhinorrhea, sinus pressure, sneezing and sore throat.   Eyes: Negative for redness and itching.  Respiratory: Positive for cough, chest tightness, shortness of breath and wheezing.   Cardiovascular: Positive for leg swelling. Negative for palpitations.  Gastrointestinal: Negative for nausea and vomiting.  Genitourinary: Negative for dysuria.  Musculoskeletal: Positive for joint swelling.  Skin: Negative for rash.  Allergic/Immunologic: Negative.  Negative for environmental allergies, food allergies and immunocompromised state.  Neurological: Negative for headaches.  Hematological: Does not bruise/bleed easily.  Psychiatric/Behavioral: Negative for dysphoric mood. The patient is not nervous/anxious.    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  Past Surgical History: She  has a past surgical history that includes Cholecystectomy; Sigmoid resection / rectopexy; Ventral hernia repair (2007); Total abdominal hysterectomy; Rotator cuff repair (Left); Cardiac catheterization (2008); Colonoscopy (10/04/06); DIAGNOSTIC MAMMOGRAM (03/17/2011); Tonsillectomy; Appendectomy; Total knee arthroplasty (Right, 08/11/2013); and Cystoscopy with biopsy (N/A, 06/16/2016).  Family History: Her family history includes Diabetes in her father; Diverticulitis in her sister; Hypertension in her father, sister, and unknown relative; Kidney disease in her father; Liver disease in her mother; Rheum arthritis in her sister.  Social History: She  reports that  has never smoked. she has never used smokeless tobacco. She reports that she does not drink alcohol or use drugs.  Medications: Allergies as of 03/01/2017      Reactions   Penicillins Other (See Comments)   Has patient had a PCN reaction causing immediate rash, facial/tongue/throat swelling, SOB or lightheadedness with hypotension: No Has patient had a PCN reaction causing severe rash involving mucus membranes or skin necrosis: No Has patient had a PCN reaction that required hospitalization: No Has patient had a PCN reaction occurring within the last 10 years: Unknown If all of the above answers are "NO", then may proceed with Cephalosporin use.      Medication List        Accurate as of 03/01/17 10:52 AM. Always use your most recent med list.            allopurinol 300 MG tablet Commonly known as:  ZYLOPRIM Take 300 mg by mouth daily.   docusate sodium 100 MG capsule Commonly known as:  COLACE Take 100 mg by mouth every 12 (twelve) hours.   esomeprazole 40 MG capsule Commonly known as:  NEXIUM Take 40 mg by mouth daily.   furosemide 20 MG tablet Commonly known as:  LASIX Take 20-60 mg by mouth daily as needed. 60mg  in the morning, 20 mg in the evening   guaiFENesin-dextromethorphan 100-10 MG/5ML syrup Commonly known as:  ROBITUSSIN DM Take 5 mLs by mouth every 4 (four) hours as needed for cough.   insulin NPH-regular Human (70-30) 100 UNIT/ML injection Commonly known as:  NOVOLIN 70/30 45 units in the am and 50 units at supper   iron polysaccharides 150 MG capsule Commonly known as:  NIFEREX Take 150 mg by mouth daily.   lisinopril 5 MG tablet Commonly known as:  PRINIVIL,ZESTRIL Take 5 mg by mouth daily.   metFORMIN 1000 MG tablet Commonly known as:  GLUCOPHAGE Take 1,000 mg by mouth 2 (two) times daily with a meal.   PRESERVISION AREDS 2+MULTI VIT PO Take 1 capsule by mouth daily.   simvastatin 80 MG tablet Commonly known as:  ZOCOR Take 40 mg by mouth at bedtime.   VENTOLIN HFA 108 (90 Base)  MCG/ACT inhaler Generic drug:  albuterol Take 2 puffs by mouth every 4 (four) hours as needed. for wheezing

## 2017-03-01 NOTE — Progress Notes (Signed)
   Subjective:    Patient ID: Lauren Crosby, female    DOB: 01/22/1931, 84 y.o.   MRN: [id number]  HPI    Review of Systems  Constitutional: Negative for fever and unexpected weight change.  HENT: Positive for trouble swallowing. Negative for congestion, dental problem, ear pain, nosebleeds, postnasal drip, rhinorrhea, sinus pressure, sneezing and sore throat.   Eyes: Negative for redness and itching.  Respiratory: Positive for cough, chest tightness, shortness of breath and wheezing.   Cardiovascular: Positive for leg swelling. Negative for palpitations.  Gastrointestinal: Negative for nausea and vomiting.  Genitourinary: Negative for dysuria.  Musculoskeletal: Positive for joint swelling.  Skin: Negative for rash.  Allergic/Immunologic: Negative.  Negative for environmental allergies, food allergies and immunocompromised state.  Neurological: Negative for headaches.  Hematological: Does not bruise/bleed easily.  Psychiatric/Behavioral: Negative for dysphoric mood. The patient is not nervous/anxious.        Objective:   Physical Exam        Assessment & Plan:

## 2017-03-20 DIAGNOSIS — H1089 Other conjunctivitis: Secondary | ICD-10-CM | POA: Diagnosis not present

## 2017-03-20 DIAGNOSIS — J189 Pneumonia, unspecified organism: Secondary | ICD-10-CM | POA: Diagnosis not present

## 2017-03-20 DIAGNOSIS — N390 Urinary tract infection, site not specified: Secondary | ICD-10-CM | POA: Diagnosis not present

## 2017-03-20 DIAGNOSIS — R300 Dysuria: Secondary | ICD-10-CM | POA: Diagnosis not present

## 2017-03-20 DIAGNOSIS — I509 Heart failure, unspecified: Secondary | ICD-10-CM | POA: Diagnosis not present

## 2017-03-20 DIAGNOSIS — R05 Cough: Secondary | ICD-10-CM | POA: Diagnosis not present

## 2017-03-20 DIAGNOSIS — E119 Type 2 diabetes mellitus without complications: Secondary | ICD-10-CM | POA: Diagnosis not present

## 2017-03-20 DIAGNOSIS — Z6841 Body Mass Index (BMI) 40.0 and over, adult: Secondary | ICD-10-CM | POA: Diagnosis not present

## 2017-03-20 DIAGNOSIS — R8290 Unspecified abnormal findings in urine: Secondary | ICD-10-CM | POA: Diagnosis not present

## 2017-03-26 DIAGNOSIS — C44319 Basal cell carcinoma of skin of other parts of face: Secondary | ICD-10-CM | POA: Diagnosis not present

## 2017-03-26 DIAGNOSIS — C44311 Basal cell carcinoma of skin of nose: Secondary | ICD-10-CM | POA: Diagnosis not present

## 2017-05-02 DIAGNOSIS — H35373 Puckering of macula, bilateral: Secondary | ICD-10-CM | POA: Diagnosis not present

## 2017-05-02 DIAGNOSIS — H353122 Nonexudative age-related macular degeneration, left eye, intermediate dry stage: Secondary | ICD-10-CM | POA: Diagnosis not present

## 2017-05-02 DIAGNOSIS — H353111 Nonexudative age-related macular degeneration, right eye, early dry stage: Secondary | ICD-10-CM | POA: Diagnosis not present

## 2017-05-02 DIAGNOSIS — E113293 Type 2 diabetes mellitus with mild nonproliferative diabetic retinopathy without macular edema, bilateral: Secondary | ICD-10-CM | POA: Diagnosis not present

## 2017-05-22 ENCOUNTER — Emergency Department (HOSPITAL_COMMUNITY): Payer: Medicare HMO

## 2017-05-22 ENCOUNTER — Inpatient Hospital Stay (HOSPITAL_COMMUNITY)
Admission: EM | Admit: 2017-05-22 | Discharge: 2017-06-01 | DRG: 683 | Disposition: A | Discharge status: Home / Self Care | Payer: Medicare HMO | Attending: Family Medicine | Admitting: Family Medicine

## 2017-05-22 ENCOUNTER — Encounter (HOSPITAL_COMMUNITY): Payer: Self-pay | Admitting: Emergency Medicine

## 2017-05-22 DIAGNOSIS — E11319 Type 2 diabetes mellitus with unspecified diabetic retinopathy without macular edema: Secondary | ICD-10-CM | POA: Diagnosis present

## 2017-05-22 DIAGNOSIS — D509 Iron deficiency anemia, unspecified: Secondary | ICD-10-CM | POA: Diagnosis present

## 2017-05-22 DIAGNOSIS — Z880 Allergy status to penicillin: Secondary | ICD-10-CM

## 2017-05-22 DIAGNOSIS — Z8249 Family history of ischemic heart disease and other diseases of the circulatory system: Secondary | ICD-10-CM

## 2017-05-22 DIAGNOSIS — N2889 Other specified disorders of kidney and ureter: Secondary | ICD-10-CM | POA: Diagnosis present

## 2017-05-22 DIAGNOSIS — N136 Pyonephrosis: Secondary | ICD-10-CM | POA: Diagnosis present

## 2017-05-22 DIAGNOSIS — N1330 Unspecified hydronephrosis: Secondary | ICD-10-CM | POA: Diagnosis present

## 2017-05-22 DIAGNOSIS — B373 Candidiasis of vulva and vagina: Secondary | ICD-10-CM | POA: Diagnosis present

## 2017-05-22 DIAGNOSIS — D649 Anemia, unspecified: Secondary | ICD-10-CM | POA: Diagnosis present

## 2017-05-22 DIAGNOSIS — D563 Thalassemia minor: Secondary | ICD-10-CM | POA: Diagnosis present

## 2017-05-22 DIAGNOSIS — Z87442 Personal history of urinary calculi: Secondary | ICD-10-CM

## 2017-05-22 DIAGNOSIS — I509 Heart failure, unspecified: Secondary | ICD-10-CM | POA: Diagnosis not present

## 2017-05-22 DIAGNOSIS — K219 Gastro-esophageal reflux disease without esophagitis: Secondary | ICD-10-CM | POA: Diagnosis present

## 2017-05-22 DIAGNOSIS — D631 Anemia in chronic kidney disease: Secondary | ICD-10-CM | POA: Diagnosis not present

## 2017-05-22 DIAGNOSIS — Z8261 Family history of arthritis: Secondary | ICD-10-CM

## 2017-05-22 DIAGNOSIS — L89302 Pressure ulcer of unspecified buttock, stage 2: Secondary | ICD-10-CM | POA: Diagnosis present

## 2017-05-22 DIAGNOSIS — E785 Hyperlipidemia, unspecified: Secondary | ICD-10-CM | POA: Diagnosis present

## 2017-05-22 DIAGNOSIS — Z86010 Personal history of colonic polyps: Secondary | ICD-10-CM

## 2017-05-22 DIAGNOSIS — K598 Other specified functional intestinal disorders: Secondary | ICD-10-CM

## 2017-05-22 DIAGNOSIS — Z8551 Personal history of malignant neoplasm of bladder: Secondary | ICD-10-CM

## 2017-05-22 DIAGNOSIS — I129 Hypertensive chronic kidney disease with stage 1 through stage 4 chronic kidney disease, or unspecified chronic kidney disease: Secondary | ICD-10-CM | POA: Diagnosis not present

## 2017-05-22 DIAGNOSIS — Z6841 Body Mass Index (BMI) 40.0 and over, adult: Secondary | ICD-10-CM | POA: Diagnosis not present

## 2017-05-22 DIAGNOSIS — E1122 Type 2 diabetes mellitus with diabetic chronic kidney disease: Secondary | ICD-10-CM | POA: Diagnosis not present

## 2017-05-22 DIAGNOSIS — E872 Acidosis, unspecified: Secondary | ICD-10-CM | POA: Diagnosis present

## 2017-05-22 DIAGNOSIS — N138 Other obstructive and reflux uropathy: Secondary | ICD-10-CM | POA: Diagnosis not present

## 2017-05-22 DIAGNOSIS — L308 Other specified dermatitis: Secondary | ICD-10-CM | POA: Diagnosis present

## 2017-05-22 DIAGNOSIS — R109 Unspecified abdominal pain: Secondary | ICD-10-CM | POA: Clinically undetermined

## 2017-05-22 DIAGNOSIS — N12 Tubulo-interstitial nephritis, not specified as acute or chronic: Secondary | ICD-10-CM | POA: Diagnosis not present

## 2017-05-22 DIAGNOSIS — E279 Disorder of adrenal gland, unspecified: Secondary | ICD-10-CM | POA: Diagnosis present

## 2017-05-22 DIAGNOSIS — Z794 Long term (current) use of insulin: Secondary | ICD-10-CM

## 2017-05-22 DIAGNOSIS — K5790 Diverticulosis of intestine, part unspecified, without perforation or abscess without bleeding: Secondary | ICD-10-CM | POA: Diagnosis not present

## 2017-05-22 DIAGNOSIS — K567 Ileus, unspecified: Secondary | ICD-10-CM | POA: Diagnosis present

## 2017-05-22 DIAGNOSIS — L89152 Pressure ulcer of sacral region, stage 2: Secondary | ICD-10-CM | POA: Diagnosis present

## 2017-05-22 DIAGNOSIS — E875 Hyperkalemia: Secondary | ICD-10-CM | POA: Diagnosis not present

## 2017-05-22 DIAGNOSIS — I878 Other specified disorders of veins: Secondary | ICD-10-CM | POA: Diagnosis present

## 2017-05-22 DIAGNOSIS — M79606 Pain in leg, unspecified: Secondary | ICD-10-CM | POA: Diagnosis not present

## 2017-05-22 DIAGNOSIS — E1121 Type 2 diabetes mellitus with diabetic nephropathy: Secondary | ICD-10-CM | POA: Diagnosis present

## 2017-05-22 DIAGNOSIS — Z833 Family history of diabetes mellitus: Secondary | ICD-10-CM

## 2017-05-22 DIAGNOSIS — R0603 Acute respiratory distress: Secondary | ICD-10-CM | POA: Diagnosis not present

## 2017-05-22 DIAGNOSIS — R600 Localized edema: Secondary | ICD-10-CM | POA: Diagnosis present

## 2017-05-22 DIAGNOSIS — E8770 Fluid overload, unspecified: Secondary | ICD-10-CM | POA: Diagnosis not present

## 2017-05-22 DIAGNOSIS — N390 Urinary tract infection, site not specified: Secondary | ICD-10-CM | POA: Diagnosis present

## 2017-05-22 DIAGNOSIS — E1321 Other specified diabetes mellitus with diabetic nephropathy: Secondary | ICD-10-CM | POA: Diagnosis not present

## 2017-05-22 DIAGNOSIS — Z9049 Acquired absence of other specified parts of digestive tract: Secondary | ICD-10-CM

## 2017-05-22 DIAGNOSIS — I10 Essential (primary) hypertension: Secondary | ICD-10-CM | POA: Diagnosis not present

## 2017-05-22 DIAGNOSIS — K5981 Ogilvie syndrome: Secondary | ICD-10-CM

## 2017-05-22 DIAGNOSIS — R062 Wheezing: Secondary | ICD-10-CM

## 2017-05-22 DIAGNOSIS — R32 Unspecified urinary incontinence: Secondary | ICD-10-CM | POA: Diagnosis present

## 2017-05-22 DIAGNOSIS — R140 Abdominal distension (gaseous): Secondary | ICD-10-CM | POA: Diagnosis not present

## 2017-05-22 DIAGNOSIS — R0602 Shortness of breath: Secondary | ICD-10-CM | POA: Diagnosis not present

## 2017-05-22 DIAGNOSIS — N179 Acute kidney failure, unspecified: Principal | ICD-10-CM | POA: Diagnosis present

## 2017-05-22 DIAGNOSIS — L309 Dermatitis, unspecified: Secondary | ICD-10-CM | POA: Diagnosis present

## 2017-05-22 DIAGNOSIS — Z96651 Presence of right artificial knee joint: Secondary | ICD-10-CM | POA: Diagnosis present

## 2017-05-22 DIAGNOSIS — R1030 Lower abdominal pain, unspecified: Secondary | ICD-10-CM | POA: Diagnosis not present

## 2017-05-22 DIAGNOSIS — M109 Gout, unspecified: Secondary | ICD-10-CM | POA: Diagnosis present

## 2017-05-22 DIAGNOSIS — Z841 Family history of disorders of kidney and ureter: Secondary | ICD-10-CM

## 2017-05-22 DIAGNOSIS — N189 Chronic kidney disease, unspecified: Secondary | ICD-10-CM | POA: Diagnosis present

## 2017-05-22 DIAGNOSIS — Z90710 Acquired absence of both cervix and uterus: Secondary | ICD-10-CM

## 2017-05-22 DIAGNOSIS — Z79899 Other long term (current) drug therapy: Secondary | ICD-10-CM

## 2017-05-22 DIAGNOSIS — N132 Hydronephrosis with renal and ureteral calculous obstruction: Secondary | ICD-10-CM | POA: Diagnosis not present

## 2017-05-22 DIAGNOSIS — R300 Dysuria: Secondary | ICD-10-CM | POA: Diagnosis not present

## 2017-05-22 DIAGNOSIS — K56609 Unspecified intestinal obstruction, unspecified as to partial versus complete obstruction: Secondary | ICD-10-CM | POA: Diagnosis not present

## 2017-05-22 DIAGNOSIS — B3731 Acute candidiasis of vulva and vagina: Secondary | ICD-10-CM | POA: Diagnosis present

## 2017-05-22 LAB — URINALYSIS, ROUTINE W REFLEX MICROSCOPIC
BILIRUBIN URINE: NEGATIVE
Glucose, UA: NEGATIVE mg/dL
KETONES UR: NEGATIVE mg/dL
NITRITE: NEGATIVE
Protein, ur: 100 mg/dL — AB
SPECIFIC GRAVITY, URINE: 1.011 (ref 1.005–1.030)
WBC, UA: >50 WBC/hpf — ABNORMAL HIGH (ref 0–5)
pH: 5 (ref 5.0–8.0)

## 2017-05-22 LAB — CBC
HCT: 23.4 % — ABNORMAL LOW (ref 36.0–46.0)
Hemoglobin: 7.3 g/dL — ABNORMAL LOW (ref 12.0–15.0)
MCH: 19.8 pg — ABNORMAL LOW (ref 26.0–34.0)
MCHC: 31.2 g/dL (ref 30.0–36.0)
MCV: 63.4 fL — AB (ref 78.0–100.0)
Platelets: 205 10*3/uL (ref 150–400)
RBC: 3.69 MIL/uL — AB (ref 3.87–5.11)
RDW: 18.3 % — ABNORMAL HIGH (ref 11.5–15.5)
WBC: 7.6 10*3/uL (ref 4.0–10.5)

## 2017-05-22 LAB — IRON AND TIBC
Iron: 20 ug/dL — ABNORMAL LOW (ref 28–170)
Saturation Ratios: 7 % — ABNORMAL LOW (ref 10.4–31.8)
TIBC: 276 ug/dL (ref 250–450)
UIBC: 256 ug/dL

## 2017-05-22 LAB — COMPREHENSIVE METABOLIC PANEL
ALBUMIN: 3.3 g/dL — AB (ref 3.5–5.0)
ALT: 25 U/L (ref 14–54)
AST: 25 U/L (ref 15–41)
Alkaline Phosphatase: 158 U/L — ABNORMAL HIGH (ref 38–126)
Anion gap: 10 (ref 5–15)
BUN: 83 mg/dL — ABNORMAL HIGH (ref 6–20)
CHLORIDE: 112 mmol/L — AB (ref 101–111)
CO2: 15 mmol/L — ABNORMAL LOW (ref 22–32)
Calcium: 9.3 mg/dL (ref 8.9–10.3)
Creatinine, Ser: 2.35 mg/dL — ABNORMAL HIGH (ref 0.44–1.00)
GFR calc non Af Amer: 18 mL/min — ABNORMAL LOW (ref >60)
GFR, EST AFRICAN AMERICAN: 21 mL/min — AB (ref >60)
Glucose, Bld: 148 mg/dL — ABNORMAL HIGH (ref 65–99)
Potassium: 6.8 mmol/L (ref 3.5–5.1)
SODIUM: 137 mmol/L (ref 135–145)
TOTAL PROTEIN: 7.5 g/dL (ref 6.5–8.1)
Total Bilirubin: 0.4 mg/dL (ref 0.3–1.2)

## 2017-05-22 LAB — RENAL FUNCTION PANEL
ALBUMIN: 3.1 g/dL — AB (ref 3.5–5.0)
ANION GAP: 10 (ref 5–15)
BUN: 82 mg/dL — ABNORMAL HIGH (ref 6–20)
CALCIUM: 9.3 mg/dL (ref 8.9–10.3)
CO2: 16 mmol/L — ABNORMAL LOW (ref 22–32)
CREATININE: 2.11 mg/dL — AB (ref 0.44–1.00)
Chloride: 113 mmol/L — ABNORMAL HIGH (ref 101–111)
GFR, EST AFRICAN AMERICAN: 24 mL/min — AB (ref >60)
GFR, EST NON AFRICAN AMERICAN: 21 mL/min — AB (ref >60)
Glucose, Bld: 102 mg/dL — ABNORMAL HIGH (ref 65–99)
PHOSPHORUS: 3.9 mg/dL (ref 2.5–4.6)
Potassium: 5.7 mmol/L — ABNORMAL HIGH (ref 3.5–5.1)
SODIUM: 139 mmol/L (ref 135–145)

## 2017-05-22 LAB — CBG MONITORING, ED: Glucose-Capillary: 84 mg/dL (ref 65–99)

## 2017-05-22 LAB — SODIUM, URINE, RANDOM: SODIUM UR: 65 mmol/L

## 2017-05-22 LAB — CREATININE, URINE, RANDOM: CREATININE, URINE: 61.58 mg/dL

## 2017-05-22 LAB — PREPARE RBC (CROSSMATCH)

## 2017-05-22 LAB — LIPASE, BLOOD: LIPASE: 76 U/L — AB (ref 11–51)

## 2017-05-22 LAB — AMYLASE: AMYLASE: 184 U/L — AB (ref 28–100)

## 2017-05-22 MED ORDER — SODIUM POLYSTYRENE SULFONATE 15 GM/60ML PO SUSP
60.0000 g | Freq: Once | ORAL | Status: AC
  Administered 2017-05-22: 60 g via RECTAL
  Filled 2017-05-22 (×2): qty 240

## 2017-05-22 MED ORDER — PANTOPRAZOLE SODIUM 40 MG PO TBEC
80.0000 mg | DELAYED_RELEASE_TABLET | Freq: Every day | ORAL | Status: DC
  Administered 2017-05-23 – 2017-05-25 (×2): 80 mg via ORAL
  Filled 2017-05-22 (×2): qty 2

## 2017-05-22 MED ORDER — DOCUSATE SODIUM 100 MG PO CAPS
100.0000 mg | ORAL_CAPSULE | Freq: Two times a day (BID) | ORAL | Status: DC
  Administered 2017-05-23 – 2017-06-01 (×16): 100 mg via ORAL
  Filled 2017-05-22 (×17): qty 1

## 2017-05-22 MED ORDER — IOHEXOL 300 MG/ML  SOLN
30.0000 mL | Freq: Once | INTRAMUSCULAR | Status: AC | PRN
  Administered 2017-05-24: 30 mL via ORAL

## 2017-05-22 MED ORDER — STERILE WATER FOR INJECTION IV SOLN
INTRAVENOUS | Status: AC
  Administered 2017-05-22: 20:00:00 via INTRAVENOUS
  Filled 2017-05-22: qty 850

## 2017-05-22 MED ORDER — ONDANSETRON HCL 4 MG PO TABS: 4.0000 mg | ORAL_TABLET | Freq: Four times a day (QID) | ORAL | Status: DC | PRN

## 2017-05-22 MED ORDER — HYDROMORPHONE HCL 1 MG/ML IJ SOLN
1.0000 mg | Freq: Once | INTRAMUSCULAR | Status: AC
  Administered 2017-05-22: 1 mg via INTRAVENOUS
  Filled 2017-05-22: qty 1

## 2017-05-22 MED ORDER — SODIUM CHLORIDE 0.9 % IV BOLUS
1000.0000 mL | Freq: Once | INTRAVENOUS | Status: DC
  Administered 2017-05-22: 1000 mL via INTRAVENOUS

## 2017-05-22 MED ORDER — DEXTROSE 50 % IV SOLN
1.0000 | Freq: Once | INTRAVENOUS | Status: AC
  Administered 2017-05-22: 50 mL via INTRAVENOUS
  Filled 2017-05-22: qty 50

## 2017-05-22 MED ORDER — ACETAMINOPHEN 650 MG RE SUPP: 650.0000 mg | Freq: Four times a day (QID) | RECTAL | Status: DC | PRN

## 2017-05-22 MED ORDER — INSULIN ASPART 100 UNIT/ML ~~LOC~~ SOLN
0.0000 [IU] | SUBCUTANEOUS | Status: DC
  Administered 2017-05-23: 2 [IU] via SUBCUTANEOUS
  Administered 2017-05-23 – 2017-05-24 (×2): 3 [IU] via SUBCUTANEOUS
  Administered 2017-05-24 (×2): 2 [IU] via SUBCUTANEOUS
  Administered 2017-05-24 (×2): 3 [IU] via SUBCUTANEOUS
  Administered 2017-05-25 (×5): 2 [IU] via SUBCUTANEOUS
  Administered 2017-05-26: 8 [IU] via SUBCUTANEOUS
  Administered 2017-05-26 (×2): 2 [IU] via SUBCUTANEOUS
  Administered 2017-05-26: 3 [IU] via SUBCUTANEOUS
  Administered 2017-05-26 – 2017-05-27 (×2): 5 [IU] via SUBCUTANEOUS
  Administered 2017-05-27: 2 [IU] via SUBCUTANEOUS
  Administered 2017-05-27: 3 [IU] via SUBCUTANEOUS
  Administered 2017-05-27: 5 [IU] via SUBCUTANEOUS
  Administered 2017-05-27 (×2): 3 [IU] via SUBCUTANEOUS
  Administered 2017-05-27: 8 [IU] via SUBCUTANEOUS
  Administered 2017-05-28: 5 [IU] via SUBCUTANEOUS
  Administered 2017-05-28: 11 [IU] via SUBCUTANEOUS
  Administered 2017-05-28: 3 [IU] via SUBCUTANEOUS
  Administered 2017-05-28: 5 [IU] via SUBCUTANEOUS
  Administered 2017-05-29: 3 [IU] via SUBCUTANEOUS
  Administered 2017-05-29: 5 [IU] via SUBCUTANEOUS
  Administered 2017-05-29: 2 [IU] via SUBCUTANEOUS
  Administered 2017-05-29: 8 [IU] via SUBCUTANEOUS
  Administered 2017-05-29: 3 [IU] via SUBCUTANEOUS

## 2017-05-22 MED ORDER — SODIUM CHLORIDE 0.9 % IV SOLN
1.0000 g | INTRAVENOUS | Status: DC
  Administered 2017-05-23 – 2017-05-31 (×9): 1 g via INTRAVENOUS
  Filled 2017-05-22: qty 1
  Filled 2017-05-22: qty 10
  Filled 2017-05-22 (×2): qty 1
  Filled 2017-05-22: qty 10
  Filled 2017-05-22 (×2): qty 1
  Filled 2017-05-22: qty 10
  Filled 2017-05-22: qty 1

## 2017-05-22 MED ORDER — INSULIN ASPART 100 UNIT/ML ~~LOC~~ SOLN
10.0000 [IU] | Freq: Once | SUBCUTANEOUS | Status: AC
  Administered 2017-05-22: 10 [IU] via INTRAVENOUS
  Filled 2017-05-22: qty 1

## 2017-05-22 MED ORDER — SODIUM CHLORIDE 0.9 % IV SOLN
2.0000 g | Freq: Once | INTRAVENOUS | Status: AC
  Administered 2017-05-23: 2 g via INTRAVENOUS
  Filled 2017-05-22: qty 2

## 2017-05-22 MED ORDER — SODIUM BICARBONATE 8.4 % IV SOLN
25.0000 meq | Freq: Once | INTRAVENOUS | Status: AC
  Administered 2017-05-22: 25 meq via INTRAVENOUS
  Filled 2017-05-22: qty 50

## 2017-05-22 MED ORDER — ALBUTEROL SULFATE (2.5 MG/3ML) 0.083% IN NEBU
5.0000 mg | INHALATION_SOLUTION | Freq: Once | RESPIRATORY_TRACT | Status: AC
  Administered 2017-05-22: 5 mg via RESPIRATORY_TRACT
  Filled 2017-05-22: qty 6

## 2017-05-22 MED ORDER — POLYSACCHARIDE IRON COMPLEX 150 MG PO CAPS
150.0000 mg | ORAL_CAPSULE | Freq: Two times a day (BID) | ORAL | Status: DC
  Administered 2017-05-23 – 2017-06-01 (×19): 150 mg via ORAL
  Filled 2017-05-22 (×20): qty 1

## 2017-05-22 MED ORDER — ALLOPURINOL 300 MG PO TABS
300.0000 mg | ORAL_TABLET | Freq: Every day | ORAL | Status: DC
  Administered 2017-05-23 – 2017-06-01 (×9): 300 mg via ORAL
  Filled 2017-05-22: qty 3
  Filled 2017-05-22: qty 1
  Filled 2017-05-22: qty 3
  Filled 2017-05-22 (×7): qty 1
  Filled 2017-05-22 (×2): qty 3
  Filled 2017-05-22: qty 1
  Filled 2017-05-22: qty 3
  Filled 2017-05-22: qty 1

## 2017-05-22 MED ORDER — SODIUM CHLORIDE 0.9 % IV SOLN: Freq: Once | INTRAVENOUS | Status: DC

## 2017-05-22 MED ORDER — ONDANSETRON HCL 4 MG/2ML IJ SOLN: 4.0000 mg | Freq: Four times a day (QID) | INTRAMUSCULAR | Status: DC | PRN

## 2017-05-22 MED ORDER — ACETAMINOPHEN 325 MG PO TABS
650.0000 mg | ORAL_TABLET | Freq: Four times a day (QID) | ORAL | Status: DC | PRN
  Administered 2017-05-27: 650 mg via ORAL
  Filled 2017-05-22: qty 2

## 2017-05-22 MED ORDER — SODIUM CHLORIDE 0.9 % IV SOLN
1.0000 g | Freq: Once | INTRAVENOUS | Status: AC
  Administered 2017-05-22: 1 g via INTRAVENOUS
  Filled 2017-05-22: qty 10

## 2017-05-22 NOTE — ED Notes (Signed)
RN notified of abnormal lab 

## 2017-05-22 NOTE — Consult Note (Signed)
Urology Consult Note   Requesting Attending Physician:  Etta Quill, DO Service Providing Consult: Urology  Consulting Attending: Alexis Frock   Reason for Consult:  Left Hydronephrosis, AKI   HPI: DANELLA Crosby is seen in consultation for reasons noted above at the request of Etta Quill, DO for evaluation of Left hydronephrosis and AKI.  This is a 84 y.o. female with DM, HTN, nephrolithiasis, LgTa, and diverticulosis who presents with AKI, hyperkalemia and left hydronephrosis.   Over the past few days has been tired and had poor PO intake. Vague abdm pain. No flank pain. No fever or chills. Brought to Ed by husband today.   Afebrile, stable vitals  Cr - 2.35 (baseline - 1.3-1.4)  Wbc - 7.6 K - 6.8, 5.7 on recheck U/A - large LE, negative nitrite, many bacteria, >50 wbc.   Ct showed left hydronephrosis to level of the bladder. No obvious ureteral stone. Prior CT scans (03/2016 and 04/2009 reviewed. Pelvic calcifications in region of left ureter and UVJ consistent with most recent scan) .   Hx of LgTa resected by Dr. Junious Silk, most recent local cysto 6 mo ago normal. Hx of DM. Had large volume incontinence throughout the day ( soaks 3 - 4 pads). Husband states she has rUTI. No recent stone events. No recent gross hematuria.  Past Medical History: Past Medical History:  Diagnosis Date  . Adrenal adenoma   . Anemia   . Arthritis    OA AND PAIN BOTH KNEES BUT RIGHT KNEE PAIN WORSE; SOME LOWER BACK PAIN  . Cellulitis and abscess of trunk   . Colon polyp    adenomatous  . Diabetes ([place])    type 2  PT IS ON ORAL MEDICATION AND INSULIN  . Diverticulitis   . GERD (gastroesophageal reflux disease)    NO MEDS FOR GERD  . Goiter   . Gout   . Hepatitis    YELLOW JAUNDICE WHEN A CHILD  . Hernia   . Hyperlipemia   . Hypertension   . Nephrolithiasis   . 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 Obesity   . Open wound of abdominal wall, anterior,  without mention of complication   . Pneumonia    IN THE PAST  . Retinopathy due to secondary diabetes mellitus ([place])   . Shortness of breath    PT IS NOT ABLE TO BE ACTIVE BECAUSE OF KNEE PROBLEM; HAS ALWAYS HAD PROBLEM WITH BREATHING HEAVY - DOES GET SOB WITH EXERTION.  Marland Kitchen Thalassemia minor     Past Surgical History:  Past Surgical History:  Procedure Laterality Date  . APPENDECTOMY     1993 PT HAD APPENDECTOMY AND ABDOMINAL SURGERY FRO DIVERTICULITS  . CARDIAC CATHETERIZATION  2008   conclusion: smooth and normal coronary arteries, normal left ventricular systolic function  . CHOLECYSTECTOMY    . COLONOSCOPY  10/04/06  . CYSTOSCOPY WITH BIOPSY N/A 06/16/2016   Procedure: CYSTOSCOPY WITH  BLADDER BIOPSY AND FULGERATION 1CM;  Surgeon: Festus Aloe, MD;  Location: [facility];  Service: Urology;  Laterality: N/A;  . DIAGNOSTIC MAMMOGRAM  03/17/2011  . ROTATOR CUFF REPAIR Left   . SIGMOID RESECTION / RECTOPEXY    . TONSILLECTOMY    . TOTAL ABDOMINAL HYSTERECTOMY    . TOTAL KNEE ARTHROPLASTY Right 08/11/2013   Procedure: RIGHT TOTAL KNEE ARTHROPLASTY;  Surgeon: Mauri Pole, MD;  Location: [facility];  Service: Orthopedics;  Laterality: Right;  . VENTRAL HERNIA REPAIR  2007  Medication: Current Facility-Administered Medications  Medication Dose Route Frequency Provider Last Rate Last Dose  . acetaminophen (TYLENOL) tablet 650 mg  650 mg Oral Q6H PRN Etta Quill, DO       Or  . acetaminophen (TYLENOL) suppository 650 mg  650 mg Rectal Q6H PRN Etta Quill, DO      . [START ON 05/23/2017] allopurinol (ZYLOPRIM) tablet 300 mg  300 mg Oral Daily Jennette Kettle M, DO      . [START ON 05/23/2017] cefTRIAXone (ROCEPHIN) 1 g in sodium chloride 0.9 % 100 mL IVPB  1 g Intravenous Q24H Alcario Drought, Jared M, DO      . cefTRIAXone (ROCEPHIN) 2 g in sodium chloride 0.9 % 100 mL IVPB  2 g Intravenous Once Duffy Bruce, MD      . docusate sodium (COLACE) capsule 100 mg  100 mg Oral Q12H Etta Quill, DO      . [START ON 05/23/2017] insulin aspart (novoLOG) injection 0-15 Units  0-15 Units Subcutaneous Q4H Alcario Drought, Jared M, DO      . iohexol (OMNIPAQUE) 300 MG/ML solution 30 mL  30 mL Oral Once PRN Duffy Bruce, MD      . iron polysaccharides (NIFEREX) capsule 150 mg  150 mg Oral BID Etta Quill, DO      . ondansetron [facility]) tablet 4 mg  4 mg Oral Q6H PRN Etta Quill, DO       Or  . ondansetron [facility]) injection 4 mg  4 mg Intravenous Q6H PRN Etta Quill, DO      . [START ON 05/23/2017] pantoprazole (PROTONIX) EC tablet 80 mg  80 mg Oral Q1200 Etta Quill, DO      . sodium bicarbonate 150 mEq in sterile water 1,000 mL infusion   Intravenous Continuous Duffy Bruce, MD 100 mL/hr at 05/22/17 1952     Current Outpatient Medications  Medication Sig Dispense Refill  . allopurinol (ZYLOPRIM) 300 MG tablet Take 300 mg by mouth daily.    Marland Kitchen docusate sodium (COLACE) 100 MG capsule Take 100 mg by mouth every 12 (twelve) hours.    Marland Kitchen esomeprazole (NEXIUM) 40 MG capsule Take 40 mg by mouth daily.    . furosemide (LASIX) 20 MG tablet Take 20-40 mg by mouth daily. 40mg  in the morning, 20 mg in the evening  5  . guaiFENesin-dextromethorphan (ROBITUSSIN DM) 100-10 MG/5ML syrup Take 5 mLs by mouth every 4 (four) hours as needed for cough. 118 mL 0  . insulin NPH-regular Human (NOVOLIN 70/30) (70-30) 100 UNIT/ML injection 45 units in the am and 50 units at supper    . iron polysaccharides (NIFEREX) 150 MG capsule Take 150 mg by mouth 2 (two) times daily.     Marland Kitchen lisinopril (PRINIVIL,ZESTRIL) 5 MG tablet Take 5 mg by mouth daily.    . metFORMIN (GLUCOPHAGE) 1000 MG tablet Take 1,000 mg by mouth 2 (two) times daily with a meal.     . Multiple Vitamin (MULTIVITAMIN WITH MINERALS) TABS tablet Take 1 tablet by mouth daily.    . Multiple Vitamins-Minerals (PRESERVISION AREDS 2+MULTI VIT PO) Take 1 capsule by mouth 2 (two) times daily.     . nitrofurantoin, macrocrystal-monohydrate,  (MACROBID) 100 MG capsule Take 100 mg by mouth 2 (two) times daily.    . simvastatin (ZOCOR) 80 MG tablet Take 80 mg by mouth at bedtime.       Allergies: Allergies  Allergen Reactions  . Penicillins Other (See Comments)  Has patient had a PCN reaction causing immediate rash, facial/tongue/throat swelling, SOB or lightheadedness with hypotension: No Has patient had a PCN reaction causing severe rash involving mucus membranes or skin necrosis: No Has patient had a PCN reaction that required hospitalization: No Has patient had a PCN reaction occurring within the last 10 years: Unknown If all of the above answers are "NO", then may proceed with Cephalosporin use.    Social History: Social History   Tobacco Use  . Smoking status: Never Smoker  . Smokeless tobacco: Never Used  Substance Use Topics  . Alcohol use: No    Alcohol/week: 0.0 oz  . Drug use: No    Family History Family History  Problem Relation Age of Onset  . Diabetes Father   . Hypertension Father   . Kidney disease Father   . Liver disease Mother   . Hypertension Unknown   . Rheum arthritis Sister   . Diverticulitis Sister   . Hypertension Sister   . Colon cancer Neg Hx   . Colon polyps Neg Hx   . Esophageal cancer Neg Hx   . Pancreatic cancer Neg Hx   . Stomach cancer Neg Hx     Review of Systems 10 systems were reviewed and are negative except as noted specifically in the HPI.  Objective   Vital signs in last 24 hours: BP (!) 148/39   Pulse 95   Temp 97.6 F (36.4 C) (Oral)   Resp (!) 24   SpO2 100%   Physical Exam General: Tired HEENT: [place]/AT, EOMI, MMM Pulmonary: Normal work of breathing Cardiovascular: HDS, adequate peripheral perfusion Abdomen: Soft, NTTP, nondistended, no cva tenderness . GU: no flank pain, foley in place and urine murky  Extremities: warm and well perfused Neuro: Appropriate, no focal neurological deficits  Most Recent Labs: Lab Results  Component Value Date    WBC 7.6 05/22/2017   HGB 7.3 (L) 05/22/2017   HCT 23.4 (L) 05/22/2017   PLT 205 05/22/2017    Lab Results  Component Value Date   NA 139 05/22/2017   K 5.7 (H) 05/22/2017   CL 113 (H) 05/22/2017   CO2 16 (L) 05/22/2017   BUN 82 (H) 05/22/2017   CREATININE 2.11 (H) 05/22/2017   CALCIUM 9.3 05/22/2017   PHOS 3.9 05/22/2017    Lab Results  Component Value Date   INR 1.08 07/31/2013   APTT 31 07/31/2013     IMAGING: Ct Abdomen Pelvis Wo Contrast  Result Date: 05/22/2017 CLINICAL DATA:  Abdominal pain with hyperkalemia EXAM: CT ABDOMEN AND PELVIS WITHOUT CONTRAST TECHNIQUE: Multidetector CT imaging of the abdomen and pelvis was performed following the standard protocol without IV contrast. COMPARISON:  CT 03/24/2016, ultrasound 05/22/2017, CT 02/10/2015, 03/24/2016, 05/03/2009 FINDINGS: Lower chest: Lung bases demonstrate no acute consolidation or pleural effusion. Borderline cardiomegaly. Stable nodule in the low retroareolar left breast, presumed benign given lack change. Coronary vascular calcification Hepatobiliary: No focal liver abnormality is seen. Status post cholecystectomy. No biliary dilatation. Pancreas: Unremarkable. No pancreatic ductal dilatation or surrounding inflammatory changes. Spleen: Normal in size without focal abnormality. Adrenals/Urinary Tract: Right adrenal gland is normal. 3.4 cm left adrenal gland mass slow growth over time but felt consistent with adenoma. Mildly hyperdense 3.8 cm mass inferior pole right kidney, slow enlargement over time. Mild to moderate left hydronephrosis and hydroureter. No ureteral stone. Bladder within normal limits. Stomach/Bowel: Stomach nonenlarged. No dilated small bowel. Diverticular disease of the colon without acute thickening. Vascular/Lymphatic: Moderate aortic atherosclerosis. No aneurysmal  dilatation. No significantly enlarged lymph nodes. Reproductive: Status post hysterectomy. No adnexal masses. Other: Negative for free air or  free fluid. Musculoskeletal: Degenerative changes of the spine. No acute or suspicious abnormality IMPRESSION: 1. Mild to moderate left hydronephrosis and hydroureter, but without ureteral stone identified. 2. Diffuse diverticular disease of the colon without acute inflammation 3. 3.4 cm left adrenal gland adenoma 4. Mildly hyperdense right inferior renal mass, slow enlargement since 2011 Electronically Signed   By: Donavan Foil M.D.   On: 05/22/2017 21:30   US Renal  Result Date: 05/22/2017 CLINICAL DATA:  Acute kidney injury. EXAM: RENAL / URINARY TRACT ULTRASOUND COMPLETE COMPARISON:  03/24/2016 CT abdomen/pelvis. FINDINGS: Right Kidney: Length: 11.4 cm. No right hydronephrosis. Echogenic right renal parenchyma, which is normal in thickness. No right renal mass. Left Kidney: Length: 13.3 cm. No left hydronephrosis. Echogenic left renal parenchyma, which is normal in thickness. No left renal mass. Bladder: Bladder is not visualized on this scan significantly limited by patient body habitus. IMPRESSION: 1. No hydronephrosis. 2. Echogenic kidneys, indicative of nonspecific renal parenchymal disease of uncertain chronicity. 3. Bladder not visualized on this scan, which is significantly limited by patient body habitus. Electronically Signed   By: Ilona Sorrel M.D.   On: 05/22/2017 20:58   Dg Abdomen Acute W/chest  Result Date: 05/22/2017 CLINICAL DATA:  Abdominal pain and distension. Hyperkalemia. History of diabetes. EXAM: DG ABDOMEN ACUTE W/ 1V CHEST COMPARISON:  Chest radiograph January 13, 2016 FINDINGS: Cardiac silhouette is similarly enlarged with pulmonary vascular congestion. Mildly elevated RIGHT hemidiaphragm. No pleural effusion or focal consolidation. No pneumothorax. Soft tissue planes and included osseous structures are nonsuspicious. Bowel gas pattern is nondilated and nonobstructive. No intra-abdominal mass effect. Surgical clips in the included right abdomen compatible with cholecystectomy. No  intraperitoneal free air. Phleboliths in the pelvis. Soft tissue planes included osseous structures are nonsuspicious. Large body habitus. IMPRESSION: 1. Mild cardiomegaly and pulmonary vascular congestion. No acute pulmonary process. 2. Normal bowel gas pattern. Electronically Signed   By: Elon Alas M.D.   On: 05/22/2017 18:14    ------  Assessment:  84 y.o. female with DM, HTN, nephrolithiasis , and LgTa who presents with malaise for the past few days, vauge and abdm pain. Cr elevated to 2.3 and K 6.8. CT shows left sided hydronephrosis to the level of the bladder with no clear obstructive source.   Etiology of the isolated left hydro is unclear - could be 2/2 pyelonephritis, obstructive uropathy 2/2 diabetic cytopathy, or bladder ca reoccurrence.   Plan for foley catheter, abx, and hydration. If patients Cr and electrolyte stabilize, recommend catheter stay in place until outpatient urologic follow up.    Recommendations: - Foley catheter to drainage - Appreciate hospitalist assistance with treatment of presumed UTI and AKI.  - Please sent formal Urine culture  - Keep NPO until urology evaluates tomorrow  - If patient develops a fever or signs/symptoms concerning for sepsis, please call urology    Thank you for this consult. Please contact the urology consult pager with any further questions/concerns.

## 2017-05-22 NOTE — H&P (Addendum)
History and Physical    NATASCHA EDMONDS FHL:[id number] DOB: 1931-04-21 DOA: 05/22/2017  PCP: [name], MD  Patient coming from: Home  I have personally briefly reviewed patient's old medical records in [place]  Chief Complaint: Abd pain, abnormal lab  HPI: SIGOURNEY PORTILLO is a 84 y.o. female with medical history significant of DM2, HTN, nephrolithiasis x7 times (last 6-7 years ago), diverticulitis.  Patient has had progressive abd pain for 1 week with poor PO intake.  Abd pain is diffuse, no V/D.  No fevers nor chills.  No dysuria.  Patient went to PCPs office today, sent in to ED for abnormal labs.   ED Course: Cr 2.35 (up from baseline 1.1), bicarb 15, K 6.8.  [place] was negative for hydronephrosis.  Dr. Jimmy Footman consulted.  CT abd/pelvis actually came back positive for L hydronephrosis.  CT also has a slow growing R infrarenal mass slow enlargement since 2011, also shows a 3.4cm L adrenal adenoma which is known based on PMHx (confident that CT scan is on the correct patient).   Review of Systems: As per HPI otherwise 10 point review of systems negative.   Past Medical History:  Diagnosis Date  . Adrenal adenoma   . Anemia   . Arthritis    OA AND PAIN BOTH KNEES BUT RIGHT KNEE PAIN WORSE; SOME LOWER BACK PAIN  . Cellulitis and abscess of trunk   . Colon polyp    adenomatous  . Diabetes ([place])    type 2  PT IS ON ORAL MEDICATION AND INSULIN  . Diverticulitis   . GERD (gastroesophageal reflux disease)    NO MEDS FOR GERD  . Goiter   . Gout   . Hepatitis    YELLOW JAUNDICE WHEN A CHILD  . Hernia   . Hyperlipemia   . Hypertension   . Nephrolithiasis   . 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 Obesity   . Open wound of abdominal wall, anterior, without mention of complication   . Pneumonia    IN THE PAST  . Retinopathy due to secondary diabetes mellitus ([place])   . Shortness of breath    PT IS NOT ABLE TO BE ACTIVE BECAUSE OF KNEE  PROBLEM; HAS ALWAYS HAD PROBLEM WITH BREATHING HEAVY - DOES GET SOB WITH EXERTION.  Marland Kitchen Thalassemia minor     Past Surgical History:  Procedure Laterality Date  . APPENDECTOMY     1993 PT HAD APPENDECTOMY AND ABDOMINAL SURGERY FRO DIVERTICULITS  . CARDIAC CATHETERIZATION  2008   conclusion: smooth and normal coronary arteries, normal left ventricular systolic function  . CHOLECYSTECTOMY    . COLONOSCOPY  10/04/06  . CYSTOSCOPY WITH BIOPSY N/A 06/16/2016   Procedure: CYSTOSCOPY WITH  BLADDER BIOPSY AND FULGERATION 1CM;  Surgeon: Festus Aloe, MD;  Location: [facility];  Service: Urology;  Laterality: N/A;  . DIAGNOSTIC MAMMOGRAM  03/17/2011  . ROTATOR CUFF REPAIR Left   . SIGMOID RESECTION / RECTOPEXY    . TONSILLECTOMY    . TOTAL ABDOMINAL HYSTERECTOMY    . TOTAL KNEE ARTHROPLASTY Right 08/11/2013   Procedure: RIGHT TOTAL KNEE ARTHROPLASTY;  Surgeon: Mauri Pole, MD;  Location: [facility];  Service: Orthopedics;  Laterality: Right;  . VENTRAL HERNIA REPAIR  2007     reports that she has never smoked. She has never used smokeless tobacco. She reports that she does not drink alcohol or use drugs.  Allergies  Allergen Reactions  . Penicillins Other (See Comments)  Has patient had a PCN reaction causing immediate rash, facial/tongue/throat swelling, SOB or lightheadedness with hypotension: No Has patient had a PCN reaction causing severe rash involving mucus membranes or skin necrosis: No Has patient had a PCN reaction that required hospitalization: No Has patient had a PCN reaction occurring within the last 10 years: Unknown If all of the above answers are "NO", then may proceed with Cephalosporin use.    Family History  Problem Relation Age of Onset  . Diabetes Father   . Hypertension Father   . Kidney disease Father   . Liver disease Mother   . Hypertension Unknown   . Rheum arthritis Sister   . Diverticulitis Sister   . Hypertension Sister   . Colon cancer Neg Hx   . Colon  polyps Neg Hx   . Esophageal cancer Neg Hx   . Pancreatic cancer Neg Hx   . Stomach cancer Neg Hx      Prior to Admission medications   Medication Sig Start Date End Date Taking? Authorizing Provider  allopurinol (ZYLOPRIM) 300 MG tablet Take 300 mg by mouth daily.   Yes [provider]  docusate sodium (COLACE) 100 MG capsule Take 100 mg by mouth every 12 (twelve) hours.   Yes [provider]  esomeprazole (NEXIUM) 40 MG capsule Take 40 mg by mouth daily. 01/05/17  Yes [provider]  furosemide (LASIX) 20 MG tablet Take 20-40 mg by mouth daily. 82m in the morning, 20 mg in the evening 11/01/16  Yes [provider]  guaiFENesin-dextromethorphan (ROBITUSSIN DM) 100-10 MG/5ML syrup Take 5 mLs by mouth every 4 (four) hours as needed for cough. 01/14/17  Yes BRosita Fire MD  insulin NPH-regular Human (NOVOLIN 70/30) (70-30) 100 UNIT/ML injection 45 units in the am and 50 units at supper   Yes [provider]  iron polysaccharides (NIFEREX) 150 MG capsule Take 150 mg by mouth 2 (two) times daily.    Yes [provider]  lisinopril (PRINIVIL,ZESTRIL) 5 MG tablet Take 5 mg by mouth daily. 01/05/17  Yes [provider]  metFORMIN (GLUCOPHAGE) 1000 MG tablet Take 1,000 mg by mouth 2 (two) times daily with a meal.    Yes [provider]  Multiple Vitamin (MULTIVITAMIN WITH MINERALS) TABS tablet Take 1 tablet by mouth daily.   Yes [provider]  Multiple Vitamins-Minerals (PRESERVISION AREDS 2+MULTI VIT PO) Take 1 capsule by mouth 2 (two) times daily.    Yes [provider]  nitrofurantoin, macrocrystal-monohydrate, (MACROBID) 100 MG capsule Take 100 mg by mouth 2 (two) times daily. 05/22/17  Yes [provider]  simvastatin (ZOCOR) 80 MG tablet Take 80 mg by mouth at bedtime.    Yes [provider]    Physical Exam: Vitals:   05/22/17 1923 05/22/17 2000 05/22/17 2238 05/22/17 2244    BP: (!) 143/56 (!) 155/64 (!) 132/99 (!) 160/55  Pulse: 93 88 90 95  Resp: (!) 24 (!) 24 (!) 24 (!) 28  Temp:    (!) 97.5 F (36.4 C)  TempSrc:    Oral  SpO2: 100% 100% 99% 100%    Constitutional: NAD, calm, comfortable Eyes: PERRL, lids and conjunctivae normal ENMT: Mucous membranes are moist. Posterior pharynx clear of any exudate or lesions.Normal dentition.  Neck: normal, supple, no masses, no thyromegaly Respiratory: clear to auscultation bilaterally, no wheezing, no crackles. Normal respiratory effort. No accessory muscle use.  Cardiovascular: Regular rate and rhythm, no murmurs / rubs / gallops. No extremity  edema. 2+ pedal pulses. No carotid bruits.  Abdomen: Mild tenderness, surprisingly no CVA tenderness to percussion.  Pain primarily in area of a stage 2 sacral decubitus. Musculoskeletal: no clubbing / cyanosis. No joint deformity upper and lower extremities. Good ROM, no contractures. Normal muscle tone.  Skin: no rashes, lesions, ulcers. No induration Neurologic: CN 2-12 grossly intact. Sensation intact, DTR normal. Strength 5/5 in all 4.  Psychiatric: Normal judgment and insight. Alert and oriented x 3. Normal mood.    Labs on Admission: I have personally reviewed following labs and imaging studies  CBC: Recent Labs  Lab 05/22/17 1704  WBC 7.6  HGB 7.3*  HCT 23.4*  MCV 63.4*  PLT 161   Basic Metabolic Panel: Recent Labs  Lab 05/22/17 1704 05/22/17 2200  NA 137 139  K 6.8* 5.7*  CL 112* 113*  CO2 15* 16*  GLUCOSE 148* 102*  BUN 83* 82*  CREATININE 2.35* 2.11*  CALCIUM 9.3 9.3  PHOS  --  3.9   GFR: CrCl cannot be calculated (Unknown ideal weight.). Liver Function Tests: Recent Labs  Lab 05/22/17 1704 05/22/17 2200  AST 25  --   ALT 25  --   ALKPHOS 158*  --   BILITOT 0.4  --   PROT 7.5  --   ALBUMIN 3.3* 3.1*   Recent Labs  Lab 05/22/17 1704  LIPASE 76*  AMYLASE 184*   No results for input(s): AMMONIA in the last 168  hours. Coagulation Profile: No results for input(s): INR, PROTIME in the last 168 hours. Cardiac Enzymes: No results for input(s): CKTOTAL, CKMB, CKMBINDEX, TROPONINI in the last 168 hours. BNP (last 3 results) No results for input(s): PROBNP in the last 8760 hours. HbA1C: No results for input(s): HGBA1C in the last 72 hours. CBG: Recent Labs  Lab 05/22/17 2241  GLUCAP 84   Lipid Profile: No results for input(s): CHOL, HDL, LDLCALC, TRIG, CHOLHDL, LDLDIRECT in the last 72 hours. Thyroid Function Tests: No results for input(s): TSH, T4TOTAL, FREET4, T3FREE, THYROIDAB in the last 72 hours. Anemia Panel: Recent Labs    05/22/17 1704  TIBC 276  IRON 20*   Urine analysis:    Component Value Date/Time   COLORURINE YELLOW 05/22/2017 2052   APPEARANCEUR TURBID (A) 05/22/2017 2052   LABSPEC 1.011 05/22/2017 2052   PHURINE 5.0 05/22/2017 2052   GLUCOSEU NEGATIVE 05/22/2017 2052   HGBUR SMALL (A) 05/22/2017 2052   BILIRUBINUR NEGATIVE 05/22/2017 2052   BILIRUBINUR neg 09/28/2014 [place] 05/22/2017 2052   PROTEINUR 100 (A) 05/22/2017 2052   UROBILINOGEN negative 09/28/2014 1020   UROBILINOGEN 0.2 07/31/2013 1042   NITRITE NEGATIVE 05/22/2017 2052   LEUKOCYTESUR LARGE (A) 05/22/2017 2052    Radiological Exams on Admission: Ct Abdomen Pelvis Wo Contrast  Result Date: 05/22/2017 CLINICAL DATA:  Abdominal pain with hyperkalemia EXAM: CT ABDOMEN AND PELVIS WITHOUT CONTRAST TECHNIQUE: Multidetector CT imaging of the abdomen and pelvis was performed following the standard protocol without IV contrast. COMPARISON:  CT 03/24/2016, ultrasound 05/22/2017, CT 02/10/2015, 03/24/2016, 05/03/2009 FINDINGS: Lower chest: Lung bases demonstrate no acute consolidation or pleural effusion. Borderline cardiomegaly. Stable nodule in the low retroareolar left breast, presumed benign given lack change. Coronary vascular calcification Hepatobiliary: No focal liver abnormality is seen.  Status post cholecystectomy. No biliary dilatation. Pancreas: Unremarkable. No pancreatic ductal dilatation or surrounding inflammatory changes. Spleen: Normal in size without focal abnormality. Adrenals/Urinary Tract: Right adrenal gland is normal. 3.4 cm left adrenal gland mass slow growth over  time but felt consistent with adenoma. Mildly hyperdense 3.8 cm mass inferior pole right kidney, slow enlargement over time. Mild to moderate left hydronephrosis and hydroureter. No ureteral stone. Bladder within normal limits. Stomach/Bowel: Stomach nonenlarged. No dilated small bowel. Diverticular disease of the colon without acute thickening. Vascular/Lymphatic: Moderate aortic atherosclerosis. No aneurysmal dilatation. No significantly enlarged lymph nodes. Reproductive: Status post hysterectomy. No adnexal masses. Other: Negative for free air or free fluid. Musculoskeletal: Degenerative changes of the spine. No acute or suspicious abnormality IMPRESSION: 1. Mild to moderate left hydronephrosis and hydroureter, but without ureteral stone identified. 2. Diffuse diverticular disease of the colon without acute inflammation 3. 3.4 cm left adrenal gland adenoma 4. Mildly hyperdense right inferior renal mass, slow enlargement since 2011 Electronically Signed   By: Donavan Foil M.D.   On: 05/22/2017 21:30   US Renal  Result Date: 05/22/2017 CLINICAL DATA:  Acute kidney injury. EXAM: RENAL / URINARY TRACT ULTRASOUND COMPLETE COMPARISON:  03/24/2016 CT abdomen/pelvis. FINDINGS: Right Kidney: Length: 11.4 cm. No right hydronephrosis. Echogenic right renal parenchyma, which is normal in thickness. No right renal mass. Left Kidney: Length: 13.3 cm. No left hydronephrosis. Echogenic left renal parenchyma, which is normal in thickness. No left renal mass. Bladder: Bladder is not visualized on this scan significantly limited by patient body habitus. IMPRESSION: 1. No hydronephrosis. 2. Echogenic kidneys, indicative of nonspecific  renal parenchymal disease of uncertain chronicity. 3. Bladder not visualized on this scan, which is significantly limited by patient body habitus. Electronically Signed   By: Ilona Sorrel M.D.   On: 05/22/2017 20:58   Dg Abdomen Acute W/chest  Result Date: 05/22/2017 CLINICAL DATA:  Abdominal pain and distension. Hyperkalemia. History of diabetes. EXAM: DG ABDOMEN ACUTE W/ 1V CHEST COMPARISON:  Chest radiograph January 13, 2016 FINDINGS: Cardiac silhouette is similarly enlarged with pulmonary vascular congestion. Mildly elevated RIGHT hemidiaphragm. No pleural effusion or focal consolidation. No pneumothorax. Soft tissue planes and included osseous structures are nonsuspicious. Bowel gas pattern is nondilated and nonobstructive. No intra-abdominal mass effect. Surgical clips in the included right abdomen compatible with cholecystectomy. No intraperitoneal free air. Phleboliths in the pelvis. Soft tissue planes included osseous structures are nonsuspicious. Large body habitus. IMPRESSION: 1. Mild cardiomegaly and pulmonary vascular congestion. No acute pulmonary process. 2. Normal bowel gas pattern. Electronically Signed   By: Elon Alas M.D.   On: 05/22/2017 18:14    EKG: Independently reviewed.  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 AKF - with NAG met acidosis and hyperkalemia 1. Given CT findings, suspicious for Hyperkalemic RTA secondary to obstruction of L ureter if the R kidney were to be non-functional. 1. Also I see she has a history of bladder tumor s/p biopsy / resection by Dr. 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 2. Dr. Jimmy Footman has seen in consult, but note that his consult note was from before we had CT results demonstrating R  hydronephrosis and hydroureter. 3. Spoke with him again since then, he agrees that we need to consult urology. 4. EDP has called urology who will consult on patient, request patient be kept NPO after MN as well. 5. IVF bolus and Bicarb gtt 6. Repeat K down to 5.7 7. Next repeat labs at 0500 8. Tele monitor 2. UA suspicious for UTI -  1. Rocephin 3. HTN - Holding ACEi 4. DM2 - 1. Holding metformin and home insulin 2. SSI moderate scale Q4H while NPO 5. Anemia - 1. Getting 1u PRBC transfusion. 2. Repeat CBC in AM 3. Hemoccult ordered with next BM.  DVT prophylaxis: SCDs due to possible need for surgical intervention Code Status: Full Family Communication: Family at bedside Disposition Plan: Home after admit Consults called: Nephrology and urology, EDP spoke with Dr. Jimmy Footman and Dr. Dayle Points Admission status: Admit to inpatient   [place], [place], [place] [facility] Pager [phone]  If 7AM-7PM, please contact day team taking care of patient [email or web address] Password [facility]  05/22/2017, 10:59 PM

## 2017-05-22 NOTE — ED Notes (Signed)
Per PCP-had labs drawn today-states potassium 6.5

## 2017-05-22 NOTE — ED Triage Notes (Signed)
Patient BIB husband, reports she was seen at PCP for abdominal pain and called to come for evaluation for K 6.5. Denies N/V/D. Denies chest pain.

## 2017-05-22 NOTE — ED Notes (Signed)
Purewick placed on pt. 

## 2017-05-22 NOTE — ED Provider Notes (Signed)
[facility] DEPT Provider Note   CSN: [id number] Arrival date & time: 05/22/17  1534     History   Chief Complaint Chief Complaint  Patient presents with  . Abnormal Lab    HPI Lauren Crosby is a 84 y.o. female.  HPI 84 year old female with past medical history as below here with asymptomatic hyperkalemia.  The patient states that she went to her doctor today for a checkup.  She states she has chronic intermittent nausea, vomiting, and abdominal pain.  This is been ongoing for several months to years, and did not worsen.  She had routine lab work which reportedly showed significant hyperkalemia.  She is advised to present to the ED.  She states that she is mildly short of breath but this has been chronic and has not worsened.  No cough or sputum production.  No recent medication changes.  She denies any nausea, vomiting, or diarrhea.  She is been eating and drinking as she normally does.  No other medical complaints.  No cramps.  No history of renal failure or hyperkalemia.   Past Medical History:  Diagnosis Date  . Adrenal adenoma   . Anemia   . Arthritis    OA AND PAIN BOTH KNEES BUT RIGHT KNEE PAIN WORSE; SOME LOWER BACK PAIN  . Cellulitis and abscess of trunk   . Colon polyp    adenomatous  . Diabetes ([place])    type 2  PT IS ON ORAL MEDICATION AND INSULIN  . Diverticulitis   . GERD (gastroesophageal reflux disease)    NO MEDS FOR GERD  . Goiter   . Gout   . Hepatitis    YELLOW JAUNDICE WHEN A CHILD  . Hernia   . Hyperlipemia   . Hypertension   . Nephrolithiasis   . 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 Obesity   . Open wound of abdominal wall, anterior, without mention of complication   . Pneumonia    IN THE PAST  . Retinopathy due to secondary diabetes mellitus ([place])   . Shortness of breath    PT IS NOT ABLE TO BE ACTIVE BECAUSE OF KNEE PROBLEM; HAS ALWAYS HAD PROBLEM WITH BREATHING HEAVY - DOES GET SOB WITH  EXERTION.  Marland Kitchen Thalassemia minor     Patient Active Problem List   Diagnosis Date Noted  . Acute kidney failure ([place]) 05/22/2017  . Metabolic acidosis, normal anion gap (NAG) 05/22/2017  . Hyperkalemia, diminished renal excretion 05/22/2017  . Rhinovirus infection   . AKI (acute kidney injury) ([place])   . HCAP (healthcare-associated pneumonia) 01/12/2017  . ARF (acute renal failure) ([place]) 01/12/2017  . Shortness of breath   . Retinopathy due to secondary diabetes mellitus ([place])   . Pneumonia   . Obesity   . 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 Nephrolithiasis   . Hypertension   . Hyperlipemia   . Hepatitis   . Gout   . Goiter   . GERD (gastroesophageal reflux disease)   . Diverticulitis   . Diabetes ([place])   . Colon polyp   . Cellulitis and abscess of trunk   . Arthritis   . Chronic anemia   . Adrenal adenoma   . Morbid obesity ([place]) 08/13/2013  . Postoperative anemia due to acute blood loss 08/13/2013  . S/P right TKA 08/11/2013  . Preop cardiovascular exam 08/14/2012  . Chest pain 08/14/2012  . Suture granuloma 02/09/2012  . DYSPNEA ON EXERTION  10/04/2009  . HYPERLIPIDEMIA 04/15/2007  . OBESITY 04/15/2007  . ANEMIA, CHRONIC 04/15/2007  . ANXIETY 04/15/2007  . DEPRESSION 04/15/2007  . HTN (hypertension) 04/15/2007  . GERD 04/15/2007  . DEGENERATIVE JOINT DISEASE 04/15/2007  . HELICOBACTER PYLORI INFECTION, HX OF 04/15/2007  . COLONIC POLYPS 10/04/2006  . DIVERTICULOSIS, COLON 10/04/2006  . REFLUX ESOPHAGITIS September 19, 202003  . GASTRITIS, ACUTE September 19, 202003    Past Surgical History:  Procedure Laterality Date  . APPENDECTOMY     1993 PT HAD APPENDECTOMY AND ABDOMINAL SURGERY FRO DIVERTICULITS  . CARDIAC CATHETERIZATION  2008   conclusion: smooth and normal coronary arteries, normal left ventricular systolic function  . CHOLECYSTECTOMY    . COLONOSCOPY  10/04/06  . CYSTOSCOPY WITH BIOPSY N/A 06/16/2016   Procedure: CYSTOSCOPY WITH  BLADDER BIOPSY AND  FULGERATION 1CM;  Surgeon: Festus Aloe, MD;  Location: [facility];  Service: Urology;  Laterality: N/A;  . DIAGNOSTIC MAMMOGRAM  03/17/2011  . ROTATOR CUFF REPAIR Left   . SIGMOID RESECTION / RECTOPEXY    . TONSILLECTOMY    . TOTAL ABDOMINAL HYSTERECTOMY    . TOTAL KNEE ARTHROPLASTY Right 08/11/2013   Procedure: RIGHT TOTAL KNEE ARTHROPLASTY;  Surgeon: Mauri Pole, MD;  Location: [facility];  Service: Orthopedics;  Laterality: Right;  . VENTRAL HERNIA REPAIR  2007     OB History    Gravida  7   Para  6   Term  6   Preterm      AB  1   Living  6     SAB  1   TAB      Ectopic      Multiple      Live Births  6            Home Medications    Prior to Admission medications   Medication Sig Start Date End Date Taking? Authorizing Provider  allopurinol (ZYLOPRIM) 300 MG tablet Take 300 mg by mouth daily.   Yes [provider]  docusate sodium (COLACE) 100 MG capsule Take 100 mg by mouth every 12 (twelve) hours.   Yes [provider]  esomeprazole (NEXIUM) 40 MG capsule Take 40 mg by mouth daily. 01/05/17  Yes [provider]  furosemide (LASIX) 20 MG tablet Take 20-40 mg by mouth daily. 40mg  in the morning, 20 mg in the evening 11/01/16  Yes [provider]  guaiFENesin-dextromethorphan (ROBITUSSIN DM) 100-10 MG/5ML syrup Take 5 mLs by mouth every 4 (four) hours as needed for cough. 01/14/17  Yes Rosita Fire, MD  insulin NPH-regular Human (NOVOLIN 70/30) (70-30) 100 UNIT/ML injection 45 units in the am and 50 units at supper   Yes [provider]  iron polysaccharides (NIFEREX) 150 MG capsule Take 150 mg by mouth 2 (two) times daily.    Yes [provider]  lisinopril (PRINIVIL,ZESTRIL) 5 MG tablet Take 5 mg by mouth daily. 01/05/17  Yes [provider]  metFORMIN (GLUCOPHAGE) 1000 MG tablet Take 1,000 mg by mouth 2 (two) times daily with a meal.    Yes [provider]  Multiple Vitamin  (MULTIVITAMIN WITH MINERALS) TABS tablet Take 1 tablet by mouth daily.   Yes [provider]  Multiple Vitamins-Minerals (PRESERVISION AREDS 2+MULTI VIT PO) Take 1 capsule by mouth 2 (two) times daily.    Yes [provider]  nitrofurantoin, macrocrystal-monohydrate, (MACROBID) 100 MG capsule Take 100 mg by mouth 2 (two) times daily. 05/22/17  Yes [provider]  simvastatin (ZOCOR) 80 MG  tablet Take 80 mg by mouth at bedtime.    Yes [provider]    Family History Family History  Problem Relation Age of Onset  . Diabetes Father   . Hypertension Father   . Kidney disease Father   . Liver disease Mother   . Hypertension Unknown   . Rheum arthritis Sister   . Diverticulitis Sister   . Hypertension Sister   . Colon cancer Neg Hx   . Colon polyps Neg Hx   . Esophageal cancer Neg Hx   . Pancreatic cancer Neg Hx   . Stomach cancer Neg Hx     Social History Social History   Tobacco Use  . Smoking status: Never Smoker  . Smokeless tobacco: Never Used  Substance Use Topics  . Alcohol use: No    Alcohol/week: 0.0 oz  . Drug use: No     Allergies   Penicillins   Review of Systems Review of Systems  Constitutional: Positive for fatigue. Negative for chills and fever.  HENT: Negative for congestion and rhinorrhea.   Eyes: Negative for visual disturbance.  Respiratory: Positive for shortness of breath. Negative for cough and wheezing.   Cardiovascular: Negative for chest pain and leg swelling.  Gastrointestinal: Negative for abdominal pain, diarrhea, nausea and vomiting.  Genitourinary: Negative for dysuria and flank pain.  Musculoskeletal: Negative for neck pain and neck stiffness.  Skin: Negative for rash and wound.  Allergic/Immunologic: Negative for immunocompromised state.  Neurological: Positive for weakness. Negative for syncope and headaches.  All other systems reviewed and are negative.    Physical Exam Updated Vital  Signs BP (!) 148/39   Pulse 95   Temp 97.6 F (36.4 C) (Oral)   Resp (!) 24   SpO2 100%   Physical Exam  Constitutional: She is oriented to person, place, and time. She appears well-developed and well-nourished. She appears distressed.  Mildly tachypneic, appears uncomfortable  HENT:  Head: Normocephalic and atraumatic.  Eyes: Conjunctivae are normal.  Neck: Neck supple.  Cardiovascular: Normal rate, regular rhythm and normal heart sounds. Exam reveals no friction rub.  No murmur heard. Pulmonary/Chest: Effort normal and breath sounds normal. No respiratory distress. She has no wheezes. She has no rales.  Moderate tachypnea  Abdominal: Soft. She exhibits no distension. There is tenderness (Generalized).  Musculoskeletal: She exhibits no edema.  Neurological: She is alert and oriented to person, place, and time. She exhibits normal muscle tone.  Skin: Skin is warm. Capillary refill takes less than 2 seconds.  Psychiatric: She has a normal mood and affect.  Nursing note and vitals reviewed.    ED Treatments / Results  Labs (all labs ordered are listed, but only abnormal results are displayed) Labs Reviewed  LIPASE, BLOOD - Abnormal; Notable for the following components:      Result Value   Lipase 76 (*)    All other components within normal limits  COMPREHENSIVE METABOLIC PANEL - Abnormal; Notable for the following components:   Potassium 6.8 (*)    Chloride 112 (*)    CO2 15 (*)    Glucose, Bld 148 (*)    BUN 83 (*)    Creatinine, Ser 2.35 (*)    Albumin 3.3 (*)    Alkaline Phosphatase 158 (*)    GFR calc non Af Amer 18 (*)    GFR calc Af Amer 21 (*)    All other components within normal limits  CBC - Abnormal; Notable for the following components:   RBC  3.69 (*)    Hemoglobin 7.3 (*)    HCT 23.4 (*)    MCV 63.4 (*)    MCH 19.8 (*)    RDW 18.3 (*)    All other components within normal limits  RENAL FUNCTION PANEL - Abnormal; Notable for the following components:    Potassium 5.7 (*)    Chloride 113 (*)    CO2 16 (*)    Glucose, Bld 102 (*)    BUN 82 (*)    Creatinine, Ser 2.11 (*)    Albumin 3.1 (*)    GFR calc non Af Amer 21 (*)    GFR calc Af Amer 24 (*)    All other components within normal limits  AMYLASE - Abnormal; Notable for the following components:   Amylase 184 (*)    All other components within normal limits  IRON AND TIBC - Abnormal; Notable for the following components:   Iron 20 (*)    Saturation Ratios 7 (*)    All other components within normal limits  URINALYSIS, ROUTINE W REFLEX MICROSCOPIC - Abnormal; Notable for the following components:   APPearance TURBID (*)    Hgb urine dipstick SMALL (*)    Protein, ur 100 (*)    Leukocytes, UA LARGE (*)    WBC, UA >50 (*)    Bacteria, UA MANY (*)    All other components within normal limits  SODIUM, URINE, RANDOM  CREATININE, URINE, RANDOM  PARATHYROID HORMONE, INTACT (NO CA)  RENAL FUNCTION PANEL  CBC  LACTIC ACID, PLASMA  LACTIC ACID, PLASMA  CBG MONITORING, ED  CBG MONITORING, ED  TYPE AND SCREEN  PREPARE RBC (CROSSMATCH)    EKG EKG Interpretation  Date/Time:  Tuesday May 22 2017 16:02:43 EDT Ventricular Rate:  95 PR Interval:    QRS Duration: 101 QT Interval:  355 QTC Calculation: 447 R Axis:   -44 Text Interpretation:  Sinus rhythm Inferior infarct, old Anterior infarct, old Since last EKG, QRS had widened Non-specific ST-t changes Confirmed by Duffy Bruce [phone]) on 05/22/2017 5:10:41 PM   Radiology Ct Abdomen Pelvis Wo Contrast  Result Date: 05/22/2017 CLINICAL DATA:  Abdominal pain with hyperkalemia EXAM: CT ABDOMEN AND PELVIS WITHOUT CONTRAST TECHNIQUE: Multidetector CT imaging of the abdomen and pelvis was performed following the standard protocol without IV contrast. COMPARISON:  CT 03/24/2016, ultrasound 05/22/2017, CT 02/10/2015, 03/24/2016, 05/03/2009 FINDINGS: Lower chest: Lung bases demonstrate no acute consolidation or pleural effusion.  Borderline cardiomegaly. Stable nodule in the low retroareolar left breast, presumed benign given lack change. Coronary vascular calcification Hepatobiliary: No focal liver abnormality is seen. Status post cholecystectomy. No biliary dilatation. Pancreas: Unremarkable. No pancreatic ductal dilatation or surrounding inflammatory changes. Spleen: Normal in size without focal abnormality. Adrenals/Urinary Tract: Right adrenal gland is normal. 3.4 cm left adrenal gland mass slow growth over time but felt consistent with adenoma. Mildly hyperdense 3.8 cm mass inferior pole right kidney, slow enlargement over time. Mild to moderate left hydronephrosis and hydroureter. No ureteral stone. Bladder within normal limits. Stomach/Bowel: Stomach nonenlarged. No dilated small bowel. Diverticular disease of the colon without acute thickening. Vascular/Lymphatic: Moderate aortic atherosclerosis. No aneurysmal dilatation. No significantly enlarged lymph nodes. Reproductive: Status post hysterectomy. No adnexal masses. Other: Negative for free air or free fluid. Musculoskeletal: Degenerative changes of the spine. No acute or suspicious abnormality IMPRESSION: 1. Mild to moderate left hydronephrosis and hydroureter, but without ureteral stone identified. 2. Diffuse diverticular disease of the colon without acute inflammation 3. 3.4 cm left adrenal  gland adenoma 4. Mildly hyperdense right inferior renal mass, slow enlargement since 2011 Electronically Signed   By: Donavan Foil M.D.   On: 05/22/2017 21:30   US Renal  Result Date: 05/22/2017 CLINICAL DATA:  Acute kidney injury. EXAM: RENAL / URINARY TRACT ULTRASOUND COMPLETE COMPARISON:  03/24/2016 CT abdomen/pelvis. FINDINGS: Right Kidney: Length: 11.4 cm. No right hydronephrosis. Echogenic right renal parenchyma, which is normal in thickness. No right renal mass. Left Kidney: Length: 13.3 cm. No left hydronephrosis. Echogenic left renal parenchyma, which is normal in thickness. No  left renal mass. Bladder: Bladder is not visualized on this scan significantly limited by patient body habitus. IMPRESSION: 1. No hydronephrosis. 2. Echogenic kidneys, indicative of nonspecific renal parenchymal disease of uncertain chronicity. 3. Bladder not visualized on this scan, which is significantly limited by patient body habitus. Electronically Signed   By: Ilona Sorrel M.D.   On: 05/22/2017 20:58   Dg Abdomen Acute W/chest  Result Date: 05/22/2017 CLINICAL DATA:  Abdominal pain and distension. Hyperkalemia. History of diabetes. EXAM: DG ABDOMEN ACUTE W/ 1V CHEST COMPARISON:  Chest radiograph January 13, 2016 FINDINGS: Cardiac silhouette is similarly enlarged with pulmonary vascular congestion. Mildly elevated RIGHT hemidiaphragm. No pleural effusion or focal consolidation. No pneumothorax. Soft tissue planes and included osseous structures are nonsuspicious. Bowel gas pattern is nondilated and nonobstructive. No intra-abdominal mass effect. Surgical clips in the included right abdomen compatible with cholecystectomy. No intraperitoneal free air. Phleboliths in the pelvis. Soft tissue planes included osseous structures are nonsuspicious. Large body habitus. IMPRESSION: 1. Mild cardiomegaly and pulmonary vascular congestion. No acute pulmonary process. 2. Normal bowel gas pattern. Electronically Signed   By: Elon Alas M.D.   On: 05/22/2017 18:14    Procedures .Critical Care Performed by: Duffy Bruce, MD Authorized by: Duffy Bruce, MD   Critical care provider statement:    Critical care time (minutes):  75   Critical care time was exclusive of:  Separately billable procedures and treating other patients and teaching time   Critical care was necessary to treat or prevent imminent or life-threatening deterioration of the following conditions:  Circulatory failure, cardiac failure and metabolic crisis   Critical care was time spent personally by me on the following activities:   Development of treatment plan with patient or surrogate, discussions with consultants, evaluation of patient's response to treatment, examination of patient, obtaining history from patient or surrogate, ordering and performing treatments and interventions, ordering and review of laboratory studies, ordering and review of radiographic studies, pulse oximetry, re-evaluation of patient's condition and review of old charts   I assumed direction of critical care for this patient from another provider in my specialty: no     (including critical care time)  Medications Ordered in ED Medications  sodium bicarbonate 150 mEq in sterile water 1,000 mL infusion ( Intravenous New Bag/Given 05/22/17 1952)  iohexol (OMNIPAQUE) 300 MG/ML solution 30 mL (has no administration in time range)  cefTRIAXone (ROCEPHIN) 2 g in sodium chloride 0.9 % 100 mL IVPB (has no administration in time range)  acetaminophen (TYLENOL) tablet 650 mg (has no administration in time range)    Or  acetaminophen (TYLENOL) suppository 650 mg (has no administration in time range)  ondansetron (ZOFRAN) tablet 4 mg (has no administration in time range)    Or  ondansetron (ZOFRAN) injection 4 mg (has no administration in time range)  insulin aspart (novoLOG) injection 0-15 Units (has no administration in time range)  cefTRIAXone (ROCEPHIN) 1 g in sodium chloride 0.9 %  100 mL IVPB (has no administration in time range)  allopurinol (ZYLOPRIM) tablet 300 mg (has no administration in time range)  docusate sodium (COLACE) capsule 100 mg (has no administration in time range)  pantoprazole (PROTONIX) EC tablet 80 mg (has no administration in time range)  iron polysaccharides (NIFEREX) capsule 150 mg (has no administration in time range)  HYDROmorphone (DILAUDID) injection 0.5-1 mg (has no administration in time range)  calcium gluconate 1 g in sodium chloride 0.9 % 100 mL IVPB (0 g Intravenous Stopped 05/22/17 1942)  insulin aspart (novoLOG)  injection 10 Units (10 Units Intravenous Given 05/22/17 1841)  dextrose 50 % solution 50 mL (50 mLs Intravenous Given 05/22/17 1841)  albuterol (PROVENTIL) (2.5 MG/3ML) 0.083% nebulizer solution 5 mg (5 mg Nebulization Given 05/22/17 2000)  sodium bicarbonate injection 25 mEq (25 mEq Intravenous Given 05/22/17 1952)  sodium polystyrene (KAYEXALATE) 15 GM/60ML suspension 60 g (60 g Rectal Given 05/22/17 2125)  0.9 %  sodium chloride infusion ( Intravenous New Bag/Given 05/22/17 2225)  HYDROmorphone (DILAUDID) injection 1 mg (1 mg Intravenous Given 05/22/17 2223)     Initial Impression / Assessment and Plan / ED Course  I have reviewed the triage vital signs and the nursing notes.  Pertinent labs & imaging results that were available during my care of the patient were reviewed by me and considered in my medical decision making (see chart for details).  Clinical Course as of May 23 29  Wed May 23, 2017  0029 Sample Expiration: 05/25/2017 [CI]    Clinical Course User Index [CI] Duffy Bruce, MD    84 yo F with PMHx as above here with abnormal labs on outside check. On arrival, pt tachypenic, otherwise in NAD, non-toxic appearing. Lab work confirms AKI with profound hyperkalemia of 6.8, acidosis with bicarb of 15, and creatinine of 2.35.  Patient also anemic with hemoglobin of 7.3.  This appears to be acute on chronic anemia.  I suspect this is all secondary to acute on chronic renal failure.  Etiology is unclear at this time.  Does appear mildly prerenal based on her lab work, but must also consider obstructive uropathy.  I discussed with nephrology.  Patient will be started on fluids, temporized with IV insulin and bicarbonate, as well as a bicarb drip.  Renal ultrasound ordered.  I have also ordered a CT scan given her diffuse abdominal pain and distention to evaluate for obstructive mass or other abnormality.  Patient seems to be improving with temporization measures.  Her EKG shows no changes  of hyperkalemia.  CT scan does show hydronephrosis I discussed with urology, who will evaluate the patient.  Patient urinalysis also consistent with UTI.  Rocephin started.  Will plan to admit for acute renal failure with hyperkalemia and acidemia, likely combination of pre-renal and obstructive uropathies. Transfused for Hgb <8 with comorbidities and signs of renal hypoperfusion.  Final Clinical Impressions(s) / ED Diagnoses   Final diagnoses:  AKI (acute kidney injury) ([place])  Hyperkalemia  Acute on chronic anemia    ED Discharge Orders    None       Duffy Bruce, MD 05/23/17 [phone]

## 2017-05-22 NOTE — Consult Note (Signed)
Reason for Consult:AKI  Referring Physician: Dr. [name] is an 84 y.o. female.  HPI: 84 yr female with >20 yr hx DM, HTN, Hx anemia, Thal, Nephrolithiasis x 7 the last 6-7 yr ago, macular degen, DM retinal dz, diveticulitis with resx, ^ lipids, gout, goiter, hepatitis in past, GERD, who has had progressive abdomenal  Pain x 1 wk, with poor intake, anorexia, inability to rest.  Pain is described as diffuse. No V, or D.  No fevers or chills, no voiding sx or hematuria. No indigestion or heartburn.Marland Kitchen  Has has BMs.    Found to have Cr of 2.35, bicarb of 15, K of 6.8.  Normally said to have DM nephropathy but no consistent protein in urine and baseline Cr is 1.1-1.4.  Father died of uremic poisoning.  She has been on Lisinopril at home and no missed doses.  No trauma or falls.  No blood in stools.  No NSAID ingestion, or po KCl.  Is on Metformen.  FH of HTN, CVAs but no other known hx of inherited renal dz, eye or hearing defects.     Has signif chronic edema, worse now, and Chronic SOB, not worse. Constitutional: as above, weak Eyes: impaired vision Ears, nose, mouth, throat, and face: HOH Respiratory: SOB, no cough Cardiovascular: as above , edema Gastrointestinal: as in HPI Genitourinary:negative Integument/breast: negative Hematologic/lymphatic: anemia Musculoskeletal:arth but on no meds Neurological: negative Endocrine: DM Allergic/Immunologic: PCN   Past Medical History:  Diagnosis Date  . Adrenal adenoma   . Anemia   . Arthritis    OA AND PAIN BOTH KNEES BUT RIGHT KNEE PAIN WORSE; SOME LOWER BACK PAIN  . Cellulitis and abscess of trunk   . Colon polyp    adenomatous  . Diabetes ([place])    type 2  PT IS ON ORAL MEDICATION AND INSULIN  . Diverticulitis   . GERD (gastroesophageal reflux disease)    NO MEDS FOR GERD  . Goiter   . Gout   . Hepatitis    YELLOW JAUNDICE WHEN A CHILD  . Hernia   . Hyperlipemia   . Hypertension   . Nephrolithiasis   . Nephropathy  due to secondary diabetes mellitus ([place])   . Obesity   . Open wound of abdominal wall, anterior, without mention of complication   . Pneumonia    IN THE PAST  . Retinopathy due to secondary diabetes mellitus ([place])   . Shortness of breath    PT IS NOT ABLE TO BE ACTIVE BECAUSE OF KNEE PROBLEM; HAS ALWAYS HAD PROBLEM WITH BREATHING HEAVY - DOES GET SOB WITH EXERTION.  Marland Kitchen Thalassemia minor     Past Surgical History:  Procedure Laterality Date  . APPENDECTOMY     1993 PT HAD APPENDECTOMY AND ABDOMINAL SURGERY FRO DIVERTICULITS  . CARDIAC CATHETERIZATION  2008   conclusion: smooth and normal coronary arteries, normal left ventricular systolic function  . CHOLECYSTECTOMY    . COLONOSCOPY  10/04/06  . CYSTOSCOPY WITH BIOPSY N/A 06/16/2016   Procedure: CYSTOSCOPY WITH  BLADDER BIOPSY AND FULGERATION 1CM;  Surgeon: Festus Aloe, MD;  Location: [facility];  Service: Urology;  Laterality: N/A;  . DIAGNOSTIC MAMMOGRAM  03/17/2011  . ROTATOR CUFF REPAIR Left   . SIGMOID RESECTION / RECTOPEXY    . TONSILLECTOMY    . TOTAL ABDOMINAL HYSTERECTOMY    . TOTAL KNEE ARTHROPLASTY Right 08/11/2013   Procedure: RIGHT TOTAL KNEE ARTHROPLASTY;  Surgeon: Mauri Pole, MD;  Location: [facility];  Service: Orthopedics;  Laterality: Right;  . VENTRAL HERNIA REPAIR  2007    Family History  Problem Relation Age of Onset  . Diabetes Father   . Hypertension Father   . Kidney disease Father   . Liver disease Mother   . Hypertension Unknown   . Rheum arthritis Sister   . Diverticulitis Sister   . Hypertension Sister   . Colon cancer Neg Hx   . Colon polyps Neg Hx   . Esophageal cancer Neg Hx   . Pancreatic cancer Neg Hx   . Stomach cancer Neg Hx     Social History:  reports that she has never smoked. She has never used smokeless tobacco. She reports that she does not drink alcohol or use drugs.  Allergies:  Allergies  Allergen Reactions  . Penicillins Other (See Comments)    Has patient had a PCN reaction  causing immediate rash, facial/tongue/throat swelling, SOB or lightheadedness with hypotension: No Has patient had a PCN reaction causing severe rash involving mucus membranes or skin necrosis: No Has patient had a PCN reaction that required hospitalization: No Has patient had a PCN reaction occurring within the last 10 years: Unknown If all of the above answers are "NO", then may proceed with Cephalosporin use.    Medications: I have reviewed the patient's current medications. Prior to Admission:  (Not in a hospital admission)   Results for orders placed or performed during the hospital encounter of 05/22/17 (from the past 48 hour(s))  Lipase, blood     Status: Abnormal   Collection Time: 05/22/17  5:04 PM  Result Value Ref Range   Lipase 76 (H) 11 - 51 U/L    Comment: Performed at [facility], [place] [address], [place], [place] [address]  Comprehensive metabolic panel     Status: Abnormal   Collection Time: 05/22/17  5:04 PM  Result Value Ref Range   Sodium 137 135 - 145 mmol/L   Potassium 6.8 (HH) 3.5 - 5.1 mmol/L    Comment: CRITICAL RESULT CALLED TO, READ BACK BY AND VERIFIED WITH: WEST,S. RN @1757  ON 05.14.19 BY COHEN,K    Chloride 112 (H) 101 - 111 mmol/L   CO2 15 (L) 22 - 32 mmol/L   Glucose, Bld 148 (H) 65 - 99 mg/dL   BUN 83 (H) 6 - 20 mg/dL   Creatinine, Ser 2.35 (H) 0.44 - 1.00 mg/dL   Calcium 9.3 8.9 - 10.3 mg/dL   Total Protein 7.5 6.5 - 8.1 g/dL   Albumin 3.3 (L) 3.5 - 5.0 g/dL   AST 25 15 - 41 U/L   ALT 25 14 - 54 U/L   Alkaline Phosphatase 158 (H) 38 - 126 U/L   Total Bilirubin 0.4 0.3 - 1.2 mg/dL   GFR calc non Af Amer 18 (L) >60 mL/min   GFR calc Af Amer 21 (L) >60 mL/min    Comment: (NOTE) The eGFR has been calculated using the CKD EPI equation. This calculation has not been validated in all clinical situations. eGFR's persistently <60 mL/min signify possible Chronic Kidney Disease.    Anion gap 10 5 - 15    Comment: Performed at  [facility], [place] [address] [place], [place] [address]  CBC     Status: Abnormal   Collection Time: 05/22/17  5:04 PM  Result Value Ref Range   WBC 7.6 4.0 - 10.5 K/uL   RBC 3.69 (L) 3.87 - 5.11 MIL/uL   Hemoglobin 7.3 (L) 12.0 -  15.0 g/dL   HCT 23.4 (L) 36.0 - 46.0 %   MCV 63.4 (L) 78.0 - 100.0 fL   MCH 19.8 (L) 26.0 - 34.0 pg   MCHC 31.2 30.0 - 36.0 g/dL   RDW 18.3 (H) 11.5 - 15.5 %   Platelets 205 150 - 400 K/uL    Comment: Performed at [facility], [place] [address], [place], [place] [address]  Type and screen [place]     Status: None (Preliminary result)   Collection Time: 05/22/17  6:58 PM  Result Value Ref Range   ABO/RH(D) A POS    Antibody Screen PENDING    Sample Expiration      05/25/2017 Performed at [facility], [place] [address] [place], [place] [address]     Dg Abdomen Acute W/chest  Result Date: 05/22/2017 CLINICAL DATA:  Abdominal pain and distension. Hyperkalemia. History of diabetes. EXAM: DG ABDOMEN ACUTE W/ 1V CHEST COMPARISON:  Chest radiograph January 13, 2016 FINDINGS: Cardiac silhouette is similarly enlarged with pulmonary vascular congestion. Mildly elevated RIGHT hemidiaphragm. No pleural effusion or focal consolidation. No pneumothorax. Soft tissue planes and included osseous structures are nonsuspicious. Bowel gas pattern is nondilated and nonobstructive. No intra-abdominal mass effect. Surgical clips in the included right abdomen compatible with cholecystectomy. No intraperitoneal free air. Phleboliths in the pelvis. Soft tissue planes included osseous structures are nonsuspicious. Large body habitus. IMPRESSION: 1. Mild cardiomegaly and pulmonary vascular congestion. No acute pulmonary process. 2. Normal bowel gas pattern. Electronically Signed   By: Elon Alas M.D.   On: 05/22/2017 18:14    ROS Blood pressure (!) 143/56, pulse 93, temperature 98.2 F (36.8 C),  temperature source Oral, resp. rate (!) 24, SpO2 100 %. Physical Exam Physical Examination: General appearance - obese , abdm distension, uncomfortable Mental status - alert, oriented to person, place, and time, Very HOH Eyes - pupils equal and reactive, extraocular eye movements intact Ears - bilateral TM's and external ear canals normal Nose - normal and patent, no erythema, discharge or polyps Mouth - edentulous, dry mucous membranes Neck - adenopathy noted PCL Lymphatics - no palpable lymphadenopathy, no hepatosplenomegaly, posterior cervical nodes Chest - decreased bs, prolonged expir phase Heart - S1 and S2 normal, systolic murmur FP8/2 at apex Abdomen - distended, erratic bs, firm, Extremities - pedal edema 3 +, brawny, DP 1+ Skin - pale, sallow, some erythema of calves, exophytic changes on calves.  Candida in intertriginous areas  Assessment/Plan: 1 AKI with ^K, acidemia.  Most likely hypoperfusion in setting of poor intake and ACEI. No AG, c/w slow development.   k worrisome, probably ACEI and poor intake.  Doubt role of Metformen but abdm worrisome .  Need to slowly vol expand with isotonic bicarb, follow K, give Kayex, and eval. Worrisome if could be some abdm process 2 CKD DM and HTN need to eval 3 Hypertension: not an issue, avoid ACEI 4. Anemia thal plus something else, needs eval 5. Abdm distension and pain,  ?pancreatitis plus something elst 6 DM need to control 7 Obesity 8 Chronic edema suspect venous P iv isotonic bicarb carefully, 1 amp push, rectal kayex, CT without contrast, follow K, give glu/insulin,   Lauren Crosby 05/22/2017, 7:47 PM

## 2017-05-23 ENCOUNTER — Other Ambulatory Visit: Payer: Self-pay

## 2017-05-23 ENCOUNTER — Encounter (HOSPITAL_COMMUNITY): Payer: Self-pay

## 2017-05-23 DIAGNOSIS — D649 Anemia, unspecified: Secondary | ICD-10-CM | POA: Diagnosis present

## 2017-05-23 DIAGNOSIS — L89302 Pressure ulcer of unspecified buttock, stage 2: Secondary | ICD-10-CM

## 2017-05-23 DIAGNOSIS — K219 Gastro-esophageal reflux disease without esophagitis: Secondary | ICD-10-CM

## 2017-05-23 DIAGNOSIS — D509 Iron deficiency anemia, unspecified: Secondary | ICD-10-CM | POA: Diagnosis present

## 2017-05-23 DIAGNOSIS — N390 Urinary tract infection, site not specified: Secondary | ICD-10-CM | POA: Diagnosis present

## 2017-05-23 DIAGNOSIS — N179 Acute kidney failure, unspecified: Principal | ICD-10-CM

## 2017-05-23 DIAGNOSIS — N1330 Unspecified hydronephrosis: Secondary | ICD-10-CM | POA: Diagnosis present

## 2017-05-23 DIAGNOSIS — E875 Hyperkalemia: Secondary | ICD-10-CM | POA: Diagnosis present

## 2017-05-23 LAB — POTASSIUM
POTASSIUM: 4.7 mmol/L (ref 3.5–5.1)
Potassium: 5.3 mmol/L — ABNORMAL HIGH (ref 3.5–5.1)

## 2017-05-23 LAB — HEMOGLOBIN AND HEMATOCRIT, BLOOD
HEMATOCRIT: 30.4 % — AB (ref 36.0–46.0)
Hemoglobin: 9.8 g/dL — ABNORMAL LOW (ref 12.0–15.0)

## 2017-05-23 LAB — CBC
HEMATOCRIT: 21.5 % — AB (ref 36.0–46.0)
Hemoglobin: 6.8 g/dL — CL (ref 12.0–15.0)
MCH: 20.4 pg — ABNORMAL LOW (ref 26.0–34.0)
MCHC: 31.6 g/dL (ref 30.0–36.0)
MCV: 64.4 fL — AB (ref 78.0–100.0)
Platelets: 161 10*3/uL (ref 150–400)
RBC: 3.34 MIL/uL — AB (ref 3.87–5.11)
RDW: 19.6 % — ABNORMAL HIGH (ref 11.5–15.5)
WBC: 5.2 10*3/uL (ref 4.0–10.5)

## 2017-05-23 LAB — RENAL FUNCTION PANEL
Albumin: 2.8 g/dL — ABNORMAL LOW (ref 3.5–5.0)
Anion gap: 11 (ref 5–15)
BUN: 72 mg/dL — ABNORMAL HIGH (ref 6–20)
CO2: 19 mmol/L — ABNORMAL LOW (ref 22–32)
Calcium: 8.6 mg/dL — ABNORMAL LOW (ref 8.9–10.3)
Chloride: 109 mmol/L (ref 101–111)
Creatinine, Ser: 1.75 mg/dL — ABNORMAL HIGH (ref 0.44–1.00)
GFR, EST AFRICAN AMERICAN: 30 mL/min — AB (ref >60)
GFR, EST NON AFRICAN AMERICAN: 26 mL/min — AB (ref >60)
Glucose, Bld: 87 mg/dL (ref 65–99)
POTASSIUM: 5.3 mmol/L — AB (ref 3.5–5.1)
Phosphorus: 4.4 mg/dL (ref 2.5–4.6)
Sodium: 139 mmol/L (ref 135–145)

## 2017-05-23 LAB — GLUCOSE, CAPILLARY
GLUCOSE-CAPILLARY: 80 mg/dL (ref 65–99)
GLUCOSE-CAPILLARY: 102 mg/dL — AB (ref 65–99)
Glucose-Capillary: 126 mg/dL — ABNORMAL HIGH (ref 65–99)
Glucose-Capillary: 163 mg/dL — ABNORMAL HIGH (ref 65–99)
Glucose-Capillary: 181 mg/dL — ABNORMAL HIGH (ref 65–99)

## 2017-05-23 LAB — LACTIC ACID, PLASMA
Lactic Acid, Venous: 0.8 mmol/L (ref 0.5–1.9)
Lactic Acid, Venous: 0.6 mmol/L (ref 0.5–1.9)

## 2017-05-23 LAB — PREPARE RBC (CROSSMATCH)

## 2017-05-23 LAB — CBG MONITORING, ED: Glucose-Capillary: 71 mg/dL (ref 65–99)

## 2017-05-23 LAB — LIPASE, BLOOD: LIPASE: 45 U/L (ref 11–51)

## 2017-05-23 LAB — PARATHYROID HORMONE, INTACT (NO CA): PTH: 35 pg/mL (ref 15–65)

## 2017-05-23 LAB — MRSA PCR SCREENING: MRSA by PCR: NEGATIVE

## 2017-05-23 MED ORDER — SODIUM POLYSTYRENE SULFONATE 15 GM/60ML PO SUSP
45.0000 g | Freq: Once | ORAL | Status: AC
  Administered 2017-05-23: 45 g via ORAL
  Filled 2017-05-23: qty 180

## 2017-05-23 MED ORDER — SODIUM CHLORIDE 0.9 % IV SOLN
Freq: Once | INTRAVENOUS | Status: AC
  Administered 2017-05-23: 08:00:00 via INTRAVENOUS

## 2017-05-23 MED ORDER — SODIUM CHLORIDE 0.9 % IV SOLN: Freq: Once | INTRAVENOUS | Status: DC

## 2017-05-23 MED ORDER — ACETAMINOPHEN 325 MG PO TABS
650.0000 mg | ORAL_TABLET | Freq: Once | ORAL | Status: AC
  Administered 2017-05-23: 650 mg via ORAL
  Filled 2017-05-23: qty 2

## 2017-05-23 MED ORDER — HYDROMORPHONE HCL 1 MG/ML IJ SOLN
0.5000 mg | INTRAMUSCULAR | Status: DC | PRN
  Administered 2017-05-23: 0.5 mg via INTRAVENOUS
  Administered 2017-05-24: 1 mg via INTRAVENOUS
  Filled 2017-05-23 (×2): qty 1

## 2017-05-23 MED ORDER — GERHARDT'S BUTT CREAM
TOPICAL_CREAM | Freq: Three times a day (TID) | CUTANEOUS | Status: DC
  Administered 2017-05-23 – 2017-06-01 (×22): via TOPICAL
  Filled 2017-05-23 (×2): qty 1

## 2017-05-23 MED ORDER — FLUCONAZOLE 100MG IVPB
100.0000 mg | INTRAVENOUS | Status: AC
  Administered 2017-05-24 – 2017-05-29 (×6): 100 mg via INTRAVENOUS
  Filled 2017-05-23 (×7): qty 50

## 2017-05-23 MED ORDER — STERILE WATER FOR INJECTION IV SOLN
INTRAVENOUS | Status: DC
  Administered 2017-05-23 – 2017-05-24 (×3): via INTRAVENOUS
  Filled 2017-05-23 (×4): qty 850

## 2017-05-23 MED ORDER — DIPHENHYDRAMINE HCL 25 MG PO CAPS
25.0000 mg | ORAL_CAPSULE | Freq: Once | ORAL | Status: AC
  Administered 2017-05-23: 25 mg via ORAL
  Filled 2017-05-23: qty 1

## 2017-05-23 MED ORDER — FUROSEMIDE 10 MG/ML IJ SOLN
20.0000 mg | Freq: Once | INTRAMUSCULAR | Status: AC
  Administered 2017-05-23: 20 mg via INTRAVENOUS
  Filled 2017-05-23: qty 2

## 2017-05-23 MED ORDER — FLUCONAZOLE IN SODIUM CHLORIDE 200-0.9 MG/100ML-% IV SOLN
200.0000 mg | Freq: Once | INTRAVENOUS | Status: AC
  Administered 2017-05-23: 200 mg via INTRAVENOUS
  Filled 2017-05-23: qty 100

## 2017-05-23 NOTE — Progress Notes (Signed)
[facility] Progress Note  Subjective: pt groggy but w/o complaints  Vitals:   05/23/17 0600 05/23/17 0727 05/23/17 0827 05/23/17 0844  BP: (!) 122/40  (!) 138/42 (!) 139/50  Pulse: 83   86  Resp: (!) 24   (!) 24  Temp:  98.1 F (36.7 C) 97.8 F (36.6 C) 98.1 F (36.7 C)  TempSrc:  Oral Oral Oral  SpO2: 100%   100%  Weight:      Height:        Inpatient medications: . allopurinol  300 mg Oral Daily  . docusate sodium  100 mg Oral Q12H  . furosemide  20 mg Intravenous Once  . insulin aspart  0-15 Units Subcutaneous Q4H  . iron polysaccharides  150 mg Oral BID  . pantoprazole  80 mg Oral Q1200   . sodium chloride Stopped (05/22/17 2225)  . sodium chloride    . sodium chloride    . cefTRIAXone (ROCEPHIN)  IV    .  sodium bicarbonate (isotonic) infusion in sterile water 100 mL/hr at 05/23/17 0856   acetaminophen **OR** acetaminophen, HYDROmorphone (DILAUDID) injection, iohexol, ondansetron **OR** ondansetron (ZOFRAN) IV  Exam: Lethargic but responds appropriately to questions/ commands Mouth slightly dry No jvd Chest dec'd BS at bases no rales or wheezing RRR no sig MRG Abd obese soft nontender slight distended GU deferred Ext  3+ bilat brawny edema, +3 pedal edema NF, ox 3, gen'd weakness and deconditioning    home meds:  -lasix 40 am and 20 pm/ lisinopril 5 mg qd  -70/30 insulin bid/ macrobid 100 bid/ zocor/ allopurinol 300/ nexium/ metformin 1gm bid  -prn's/ stool softener    UA 09/28/14 > negative UA, neg protein UA 01/13/17 > negative UA, neg protein UA 05/22/17 > turbid, 100 prot, 6-10rbc/ >50 wbc, +epi's/ many bacteria ECHO 2014 > LVEF 60-65%, no wma's CXR 05/22/17 > mild vasc congestion and cardiomegaly, no other acute disease CT abd 5/14 > mild/ mod L hydronephrosis and hydroureter to the level of the bladder, no ureteral stones notes; R kidney w/o hydro; adrenal gland mass on left up to 3.4 cm slowly growing; mildly hyperdense R inf renal  mass slowly enlarged since 2011      Impression: 1 AKI with [place] Ip, acidemia - most likely hypoperfusion in setting poor po intake +ACEi. K+ better today after Kayex and bicarb ivf's.  Cont low rate bicarb gtt for now. Also has L hydro, urology consulting.  2 CKD w baseline creat 1.2- 1.4, hx HTN and DM, no sig proteinuria makes diab nephropathy less likely 3 Hypertension- not an issue, avoid ACEi 4 Anemia thal plus something else, needs eval 5 Abdm distension and pain,  ?pancreatitis plus something else 6 DM 2 on insulin 7 Obesity 8 Chronic edema suspect venous stasis, CXR clear 9 Bladder ca - sp fulguration bladder mass June 2018  Plan - as above     Kelly Splinter MD [facility] pager [phone]   05/23/2017, 9:22 AM   Recent Labs  Lab 05/22/17 1704 05/22/17 2200 05/23/17 0524 05/23/17 0746  NA 137 139 139  --   K 6.8* 5.7* 5.3* 5.3*  CL 112* 113* 109  --   CO2 15* 16* 19*  --   GLUCOSE 148* 102* 87  --   BUN 83* 82* 72*  --   CREATININE 2.35* 2.11* 1.75*  --   CALCIUM 9.3 9.3 8.6*  --   PHOS  --  3.9 4.4  --  Recent Labs  Lab 05/22/17 1704 05/22/17 2200 05/23/17 0524  AST 25  --   --   ALT 25  --   --   ALKPHOS 158*  --   --   BILITOT 0.4  --   --   PROT 7.5  --   --   ALBUMIN 3.3* 3.1* 2.8*   Recent Labs  Lab 05/22/17 1704 05/23/17 0536  WBC 7.6 5.2  HGB 7.3* 6.8*  HCT 23.4* 21.5*  MCV 63.4* 64.4*  PLT 205 161   Iron/TIBC/Ferritin/ %Sat    Component Value Date/Time   IRON 20 (L) 05/22/2017 1704   TIBC 276 05/22/2017 1704   FERRITIN 171 06/26/2010 0530   IRONPCTSAT 7 (L) 05/22/2017 1704   IRONPCTSAT 19 (L) 01/21/2008 1638

## 2017-05-23 NOTE — Consult Note (Signed)
Consultation  Referring Provider:  Dr. Grandville Silos    Primary Care Physician:  [name], MD Primary Gastroenterologist:  Dr. Henrene Pastor       Reason for Consultation:  Acute on chronic anemia            HPI:   Lauren Crosby is a 84 y.o. female with a past medical history as listed below including diabetes type 2, chronic anemia related to thalassemia, hypertension, nephrolithiasis x7, severe diverticulosis and others who presented to the ED on 05/22/2017 with a complaint of progressive abdominal pain for a week and poor p.o. intake.  Patient found to have a CT the abdomen pelvis positive for left hydronephrosis.  We were consulted today in regards to an acute on chronic anemia.    Today, patient had just been given Benadryl prior to blood transfusion and was very lethargic, did open her eyes to stimuli, but unable to fully answer my questions.  Her history was garnered from previous physician's notes and nurse report.  Apparently patient had progressive generalized abdominal pain for a week with decreased p.o. intake.  She described pain as diffuse with no vomiting or diarrhea, no fever or chills.  She went to her primary care physician's office yesterday and had abnormal labs which sent her to the ED.    Per nursing, patient has been somewhat lethargic during her stay, this is baseline per her husband who is not here at the moment.  She did have an enema upon admission to the ER and this was not noted to have any bright red blood or be melenic.  Patient has not had a stool since being on the floor.  She does have a Hemoccult study ordered.  ED course: Creatinine 2.35 (up from baseline 1.1), bicarb 15, potassium 6.8, CBC with a hemoglobin of 7.3 yesterday, dropped to 6.8 this morning, 9.9 four months ago; ultrasound negative for hydronephrosis, CT abdomen pelvis positive for left hydronephrosis also a slow-growing right infrarenal mass/enlargement since 2011  Most recent GI  history: 11/05/2014-office visit Dr. Henrene Pastor: To discuss surveillance colonoscopy, at that time was noted she had a chronic microcytic anemia her entire life (at least 8 years through the electronic medical record) secondary to thalassemia minor, recently complained of vaginal bleeding and blood in urine, morbid obesity, wheelchair-bound due to immobility; plan: Plan was to pursue no further routine surveillance colonoscopies due to multiple medical comorbidities and increased risk 10/04/2006-colonoscopy, Dr. Henrene Pastor: Done for surveillance of adenomatous polyps, initial polypectomy in 2001, previous surveillance in 2005; impression: two 1 mm polyps in the ascending colon, anastomosis present in the descending colon from the a sending to sigmoid colon, severe changes with deep folds 03/04/2003-EGD, Dr. Henrene Pastor: Done for chest pain, abdominal pain; impression: Reflux (See chart for other studies)  Past Medical History:  Diagnosis Date  . Adrenal adenoma   . Anemia   . Arthritis    OA AND PAIN BOTH KNEES BUT RIGHT KNEE PAIN WORSE; SOME LOWER BACK PAIN  . Cellulitis and abscess of trunk   . Colon polyp    adenomatous  . Diabetes ([place])    type 2  PT IS ON ORAL MEDICATION AND INSULIN  . Diverticulitis   . GERD (gastroesophageal reflux disease)    NO MEDS FOR GERD  . Goiter   . Gout   . Hepatitis    YELLOW JAUNDICE WHEN A CHILD  . Hernia   . Hyperlipemia   . Hypertension   . Nephrolithiasis   .  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 Obesity   . Open wound of abdominal wall, anterior, without mention of complication   . Pneumonia    IN THE PAST  . Retinopathy due to secondary diabetes mellitus ([place])   . Shortness of breath    PT IS NOT ABLE TO BE ACTIVE BECAUSE OF KNEE PROBLEM; HAS ALWAYS HAD PROBLEM WITH BREATHING HEAVY - DOES GET SOB WITH EXERTION.  Marland Kitchen Thalassemia minor     Past Surgical History:  Procedure Laterality Date  . APPENDECTOMY     1993 PT HAD APPENDECTOMY AND  ABDOMINAL SURGERY FRO DIVERTICULITS  . CARDIAC CATHETERIZATION  2008   conclusion: smooth and normal coronary arteries, normal left ventricular systolic function  . CHOLECYSTECTOMY    . COLONOSCOPY  10/04/06  . CYSTOSCOPY WITH BIOPSY N/A 06/16/2016   Procedure: CYSTOSCOPY WITH  BLADDER BIOPSY AND FULGERATION 1CM;  Surgeon: Festus Aloe, MD;  Location: [facility];  Service: Urology;  Laterality: N/A;  . DIAGNOSTIC MAMMOGRAM  03/17/2011  . ROTATOR CUFF REPAIR Left   . SIGMOID RESECTION / RECTOPEXY    . TONSILLECTOMY    . TOTAL ABDOMINAL HYSTERECTOMY    . TOTAL KNEE ARTHROPLASTY Right 08/11/2013   Procedure: RIGHT TOTAL KNEE ARTHROPLASTY;  Surgeon: Mauri Pole, MD;  Location: [facility];  Service: Orthopedics;  Laterality: Right;  . VENTRAL HERNIA REPAIR  2007    Family History  Problem Relation Age of Onset  . Diabetes Father   . Hypertension Father   . Kidney disease Father   . Liver disease Mother   . Hypertension Unknown   . Rheum arthritis Sister   . Diverticulitis Sister   . Hypertension Sister   . Colon cancer Neg Hx   . Colon polyps Neg Hx   . Esophageal cancer Neg Hx   . Pancreatic cancer Neg Hx   . Stomach cancer Neg Hx      Social History   Tobacco Use  . Smoking status: Never Smoker  . Smokeless tobacco: Never Used  Substance Use Topics  . Alcohol use: No    Alcohol/week: 0.0 oz  . Drug use: No    Prior to Admission medications   Medication Sig Start Date End Date Taking? Authorizing Provider  allopurinol (ZYLOPRIM) 300 MG tablet Take 300 mg by mouth daily.   Yes [provider]  docusate sodium (COLACE) 100 MG capsule Take 100 mg by mouth every 12 (twelve) hours.   Yes [provider]  esomeprazole (NEXIUM) 40 MG capsule Take 40 mg by mouth daily. 01/05/17  Yes [provider]  furosemide (LASIX) 20 MG tablet Take 20-40 mg by mouth daily. 40mg  in the morning, 20 mg in the evening 11/01/16  Yes [provider]   guaiFENesin-dextromethorphan (ROBITUSSIN DM) 100-10 MG/5ML syrup Take 5 mLs by mouth every 4 (four) hours as needed for cough. 01/14/17  Yes Rosita Fire, MD  insulin NPH-regular Human (NOVOLIN 70/30) (70-30) 100 UNIT/ML injection 45 units in the am and 50 units at supper   Yes [provider]  iron polysaccharides (NIFEREX) 150 MG capsule Take 150 mg by mouth 2 (two) times daily.    Yes [provider]  lisinopril (PRINIVIL,ZESTRIL) 5 MG tablet Take 5 mg by mouth daily. 01/05/17  Yes [provider]  metFORMIN (GLUCOPHAGE) 1000 MG tablet Take 1,000 mg by mouth 2 (two) times daily with a meal.    Yes [provider]  Multiple Vitamin (MULTIVITAMIN WITH MINERALS)  TABS tablet Take 1 tablet by mouth daily.   Yes [provider]  Multiple Vitamins-Minerals (PRESERVISION AREDS 2+MULTI VIT PO) Take 1 capsule by mouth 2 (two) times daily.    Yes [provider]  nitrofurantoin, macrocrystal-monohydrate, (MACROBID) 100 MG capsule Take 100 mg by mouth 2 (two) times daily. 05/22/17  Yes [provider]  simvastatin (ZOCOR) 80 MG tablet Take 80 mg by mouth at bedtime.    Yes [provider]    Current Facility-Administered Medications  Medication Dose Route Frequency Provider Last Rate Last Dose  . 0.9 %  sodium chloride infusion   Intravenous Once Duffy Bruce, MD   Stopped at 05/22/17 2225  . 0.9 %  sodium chloride infusion   Intravenous Once Kirby-Graham, Karsten Fells, NP      . 0.9 %  sodium chloride infusion   Intravenous Once Eugenie Filler, MD      . acetaminophen (TYLENOL) tablet 650 mg  650 mg Oral Q6H PRN Etta Quill, DO       Or  . acetaminophen (TYLENOL) suppository 650 mg  650 mg Rectal Q6H PRN Etta Quill, DO      . allopurinol (ZYLOPRIM) tablet 300 mg  300 mg Oral Daily Alcario Drought, Jared M, DO      . cefTRIAXone (ROCEPHIN) 1 g in sodium chloride 0.9 % 100 mL IVPB  1 g Intravenous Q24H Alcario Drought, Jared M, DO       . docusate sodium (COLACE) capsule 100 mg  100 mg Oral Q12H Alcario Drought, Jared M, DO      . furosemide (LASIX) injection 20 mg  20 mg Intravenous Once Eugenie Filler, MD      . HYDROmorphone (DILAUDID) injection 0.5-1 mg  0.5-1 mg Intravenous Q4H PRN Etta Quill, DO      . insulin aspart (novoLOG) injection 0-15 Units  0-15 Units Subcutaneous Q4H Alcario Drought, Jared M, DO      . iohexol (OMNIPAQUE) 300 MG/ML solution 30 mL  30 mL Oral Once PRN Duffy Bruce, MD      . iron polysaccharides (NIFEREX) capsule 150 mg  150 mg Oral BID Jennette Kettle M, DO   150 mg at 05/23/17 0201  . ondansetron (ZOFRAN) tablet 4 mg  4 mg Oral Q6H PRN Etta Quill, DO       Or  . ondansetron [facility]) injection 4 mg  4 mg Intravenous Q6H PRN Etta Quill, DO      . pantoprazole (PROTONIX) EC tablet 80 mg  80 mg Oral Q1200 Etta Quill, DO      . sodium bicarbonate 150 mEq in sterile water 1,000 mL infusion   Intravenous Continuous Eugenie Filler, MD 100 mL/hr at 05/23/17 [phone]    . sodium polystyrene (KAYEXALATE) 15 GM/60ML suspension 45 g  45 g Oral Once Eugenie Filler, MD        Allergies as of 05/22/2017 - Review Complete 05/22/2017  Allergen Reaction Noted  . Penicillins Other (See Comments)      Review of Systems:    Uable to obtain due to patient being medicated and lethargic   Physical Exam:  Vital signs in last 24 hours: Temp:  [97.5 F (36.4 C)-98.2 F (36.8 C)] 98.1 F (36.7 C) (05/15 0844) Pulse Rate:  [83-97] 86 (05/15 0844) Resp:  [22-30] 24 (05/15 0844) BP: (95-160)/(23-99) 139/50 (05/15 0844) SpO2:  [99 %-100 %] 100 % (05/15 0844) Weight:  [237 lb 3.4 oz (107.6 kg)] 237 lb  3.4 oz (107.6 kg) (05/15 0556) Last BM Date: 05/22/17 General:   Lethargic obese Caucasian female appears to be in NAD, Well developed, Well nourished Head:  Normocephalic and atraumatic. Eyes:   PEERL, EOMI. No icterus. Conjunctiva pink. Ears:  Normal auditory acuity. Neck:   Supple Throat: Oral cavity and pharynx without inflammation, swelling or lesion.  Lungs: Respirations even and unlabored. Lungs clear to auscultation bilaterally.   No wheezes, crackles, or rhonchi.  Heart: Normal S1, S2. No MRG. Regular rate and rhythm. No peripheral edema, cyanosis or pallor.  Abdomen:  Soft, nondistended, mild generalized ttp, No rebound or guarding. Normal bowel sounds. No appreciable masses or hepatomegaly. Rectal:  Not performed.  Msk:  Symmetrical without gross deformities.  Extremities:  Without edema, no deformity or joint abnormality.  Neurologic:  Unable to assess due to lethargy Skin:   Dry and intact without significant lesions or rashes. Psychiatric: Lethargic, unable to answers questions   LAB RESULTS: Recent Labs    05/22/17 1704 05/23/17 0536  WBC 7.6 5.2  HGB 7.3* 6.8*  HCT 23.4* 21.5*  PLT 205 161   BMET Recent Labs    05/22/17 1704 05/22/17 2200 05/23/17 0524 05/23/17 0746  NA 137 139 139  --   K 6.8* 5.7* 5.3* 5.3*  CL 112* 113* 109  --   CO2 15* 16* 19*  --   GLUCOSE 148* 102* 87  --   BUN 83* 82* 72*  --   CREATININE 2.35* 2.11* 1.75*  --   CALCIUM 9.3 9.3 8.6*  --    LFT Recent Labs    05/22/17 1704  05/23/17 0524  PROT 7.5  --   --   ALBUMIN 3.3*   < > 2.8*  AST 25  --   --   ALT 25  --   --   ALKPHOS 158*  --   --   BILITOT 0.4  --   --    < > = values in this interval not displayed.    STUDIES: Ct Abdomen Pelvis Wo Contrast  Result Date: 05/22/2017 CLINICAL DATA:  Abdominal pain with hyperkalemia EXAM: CT ABDOMEN AND PELVIS WITHOUT CONTRAST TECHNIQUE: Multidetector CT imaging of the abdomen and pelvis was performed following the standard protocol without IV contrast. COMPARISON:  CT 03/24/2016, ultrasound 05/22/2017, CT 02/10/2015, 03/24/2016, 05/03/2009 FINDINGS: Lower chest: Lung bases demonstrate no acute consolidation or pleural effusion. Borderline cardiomegaly. Stable nodule in the low retroareolar left breast,  presumed benign given lack change. Coronary vascular calcification Hepatobiliary: No focal liver abnormality is seen. Status post cholecystectomy. No biliary dilatation. Pancreas: Unremarkable. No pancreatic ductal dilatation or surrounding inflammatory changes. Spleen: Normal in size without focal abnormality. Adrenals/Urinary Tract: Right adrenal gland is normal. 3.4 cm left adrenal gland mass slow growth over time but felt consistent with adenoma. Mildly hyperdense 3.8 cm mass inferior pole right kidney, slow enlargement over time. Mild to moderate left hydronephrosis and hydroureter. No ureteral stone. Bladder within normal limits. Stomach/Bowel: Stomach nonenlarged. No dilated small bowel. Diverticular disease of the colon without acute thickening. Vascular/Lymphatic: Moderate aortic atherosclerosis. No aneurysmal dilatation. No significantly enlarged lymph nodes. Reproductive: Status post hysterectomy. No adnexal masses. Other: Negative for free air or free fluid. Musculoskeletal: Degenerative changes of the spine. No acute or suspicious abnormality IMPRESSION: 1. Mild to moderate left hydronephrosis and hydroureter, but without ureteral stone identified. 2. Diffuse diverticular disease of the colon without acute inflammation 3. 3.4 cm left adrenal gland adenoma 4. Mildly hyperdense right inferior renal  mass, slow enlargement since 2011 Electronically Signed   By: Donavan Foil M.D.   On: 05/22/2017 21:30   US Renal  Result Date: 05/22/2017 CLINICAL DATA:  Acute kidney injury. EXAM: RENAL / URINARY TRACT ULTRASOUND COMPLETE COMPARISON:  03/24/2016 CT abdomen/pelvis. FINDINGS: Right Kidney: Length: 11.4 cm. No right hydronephrosis. Echogenic right renal parenchyma, which is normal in thickness. No right renal mass. Left Kidney: Length: 13.3 cm. No left hydronephrosis. Echogenic left renal parenchyma, which is normal in thickness. No left renal mass. Bladder: Bladder is not visualized on this scan  significantly limited by patient body habitus. IMPRESSION: 1. No hydronephrosis. 2. Echogenic kidneys, indicative of nonspecific renal parenchymal disease of uncertain chronicity. 3. Bladder not visualized on this scan, which is significantly limited by patient body habitus. Electronically Signed   By: Ilona Sorrel M.D.   On: 05/22/2017 20:58   Dg Abdomen Acute W/chest  Result Date: 05/22/2017 CLINICAL DATA:  Abdominal pain and distension. Hyperkalemia. History of diabetes. EXAM: DG ABDOMEN ACUTE W/ 1V CHEST COMPARISON:  Chest radiograph January 13, 2016 FINDINGS: Cardiac silhouette is similarly enlarged with pulmonary vascular congestion. Mildly elevated RIGHT hemidiaphragm. No pleural effusion or focal consolidation. No pneumothorax. Soft tissue planes and included osseous structures are nonsuspicious. Bowel gas pattern is nondilated and nonobstructive. No intra-abdominal mass effect. Surgical clips in the included right abdomen compatible with cholecystectomy. No intraperitoneal free air. Phleboliths in the pelvis. Soft tissue planes included osseous structures are nonsuspicious. Large body habitus. IMPRESSION: 1. Mild cardiomegaly and pulmonary vascular congestion. No acute pulmonary process. 2. Normal bowel gas pattern. Electronically Signed   By: Elon Alas M.D.   On: 05/22/2017 18:14    Impression / Plan:   Impression: 1.  Acute on chronic anemia: Hemoglobin 7.3, given 1 unit PRBCs--> 6.8 this morning, 2 further unit PRBCs ordered today, chronic anemia due to thalassemia (last hgb 9.9 four months ago), last EGD 2005, last Colonoscopy 2008 with adenomatous polyps; Likely multi-factorial from thalassemia +/- GI Bleeding 2.  Acute kidney failure: nephrology and urology following, suspicious for hyperkalemic RTA secondary to obstruction of L ureter, crea improving today after fluids and foley insertion 3.  UTI: on Rocephin  Plan: 1. Patient will need EGD and Colonoscopy for further eval of  acute on chronic anemia. Timing dependent on results of blood transfusion today. Will tentatively plan to prep patient tomorrow and schedule procedures for Friday. 2. Will need to obtain consent from patient when she is more alert. 3. Continue supportive care including hgb check and transfusions as needed 4. Please await any further recommendations from Dr. Ardis Hughs later today.  Thank you for your kind consultation, we will continue to follow.  Lavone Nian [facility]  05/23/2017, 9:41 AM Pager #: [phone]

## 2017-05-23 NOTE — Progress Notes (Signed)
CRITICAL VALUE ALERT  Critical Value:  hgb 6.8  Date & Time Notied:  05/23/17 0603  Provider Notified: Tylene Fantasia  Orders Received/Actions taken: 1 unit PRBC ordered

## 2017-05-23 NOTE — Consult Note (Addendum)
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  Consistent with fungal overgrowth. Also noted is vaginal white discharge  Dressing procedure/placement/frequency: Mattress with low air loss feature in place, requested for floor upon transfer; turning and repositioning, added Gerhart's Butt cream which contains an antifungal. MD: Consider adding a few doses of systemic antifungal (E.g., Diflucan).  If you agree, please order.  [place] nursing team will not follow, but will remain available to this patient, the nursing and medical teams.  Please re-consult if needed. Thanks, Maudie Flakes, MSN, RN, [place], Arther Abbott  Pager# [phone]

## 2017-05-23 NOTE — Progress Notes (Signed)
PROGRESS NOTE    Lauren Crosby  CHY:[id number] DOB: 1931-08-29 DOA: 05/22/2017 PCP: [name], MD    Brief Narrative:  Lauren Crosby is a 84 y.o. female with medical history significant of DM2, HTN, nephrolithiasis x7 times (last 6-7 years ago), diverticulitis.  Patient has had progressive abd pain for 1 week with poor PO intake.  Abd pain is diffuse, no V/D.  No fevers nor chills.  No dysuria.  Patient went to PCPs office today, sent in to ED for abnormal labs.   ED Course: Cr 2.35 (up from baseline 1.1), bicarb 15, K 6.8.  [place] was negative for hydronephrosis.  Dr. Jimmy Footman consulted.  CT abd/pelvis actually came back positive for L hydronephrosis.  CT also has a slow growing R infrarenal mass slow enlargement since 2011, also shows a 3.4cm L adrenal adenoma which is known based on PMHx (confident that CT scan is on the correct patient).  Urology and nephrology consulted and following.    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  Patient noted to be hyperkalemic with a acidosis as well.  CT abdomen and pelvis consistent with left hydronephrosis with questionable etiology/obstructive uropathy.  Foley catheter has been placed.  Patient with a urine output of 1.275 L overnight.  Creatinine currently at 1.75 from 2.35 on admission.  Currently on bicarb drip per nephrology.  Urology following.  Urology recommended keeping patient n.p.o.  until they evaluate again this morning.  Follow.  2.  Left hydronephrosis Questionable etiology.  Patient assessed by urology who feel could be secondary to pyelonephritis versus obstructive uropathy secondary to diabetic cytopathy versus bladder cancer recurrence.  Foley catheter in place.  Patient with a urine output of 1.275 L overnight.  Creatinine slowly trending down.  Urinalysis with large leukocytes, negative nitrite, greater than 50 WBCs.  Check a urine culture.  Continue empiric IV Rocephin.  Per urology.  3.  Probable UTI Urine cultures consistent with a urinary tract infection.  Will check stat urine cultures.  Continue empiric IV Rocephin.  4.  Anemia/iron deficiency anemia/history of thalassemia Hemoglobin was at 7.3 on admission from 9.9 January 12, 2017.  Hemoglobin this morning was 6.8 after transfusion of a unit of packed red blood cells.  Patient denies any overt bleeding.  No overt bleeding noted per RN.  Transfuse 2 more units packed red blood cells.  May need a dose of IV iron.  Continue oral iron supplementation.  Continue PPI daily.  Patient with prior history of gastritis per endoscopy of 2005 and history of colonic polyps per colonoscopy of 2008.  Will consult with GI for further evaluation and management.  Follow H&H.  5.  Stage II pressure injury of the sacrum Continue current wound care.  6.  Hyperkalemia Secondary to problem #1.  Potassium at 5.3.  Repeat potassium at 5.3.  We will give another dose of Kayexalate.  Nephrology following.  7.  Hypertension ACE inhibitor on  hold will likely not resume for at least 8 weeks due to problem #1.  Pressure currently stable.  Monitor for now.  If further blood pressure control is needed could likely start patient on Norvasc.  8.  Diabetes mellitus type 2 Globin A1c was 6.6 on 06/13/2016.  Check a hemoglobin A1c.  CBGs 80 this morning.  Continue to hold metformin.  Continue sliding scale insulin.  9 acidosis Secondary to  problem #1.  Continue bicarb drip.  Per nephrology.  #10 gastroesophageal reflux disease Continue PPI.    DVT prophylaxis: SCDs Code Status: Full Family Communication: Updated patient.  No family at bedside. Disposition Plan: Remain in stepdown unit.   Consultants:   Nephrology: Dr.Deterding 05/22/2017  Urology: Dr.Narang 05/22/2017  Procedures:   CT abdomen and pelvis 05/22/2017  Acute abdominal series 05/22/2017  Renal ultrasound 05/22/2017  1 unit of packed red blood cells 05/22/2017  2 Units packed red blood cells pending 4 05/23/2017  Antimicrobials:   IV Rocephin 05/22/2017   Subjective: Sleeping.  However easily arousable.  Denies any chest pain or any change in chronic shortness of breath.  Still with some diffuse abdominal pain.  No rectal bleeding noted per RN.  Patient denies any overt bleeding prior to admission.  Objective: Vitals:   05/23/17 0600 05/23/17 0727 05/23/17 0827 05/23/17 0844  BP: (!) 122/40  (!) 138/42 (!) 139/50  Pulse: 83   86  Resp: (!) 24   (!) 24  Temp:  98.1 F (36.7 C) 97.8 F (36.6 C) 98.1 F (36.7 C)  TempSrc:  Oral Oral Oral  SpO2: 100%   100%  Weight:      Height:        Intake/Output Summary (Last 24 hours) at 05/23/2017 0956 Last data filed at 05/23/2017 0600 Gross per 24 hour  Intake 2703.33 ml  Output 1275 ml  Net 1428.33 ml   Filed Weights   05/23/17 0129 05/23/17 0556  Weight: 107.6 kg (237 lb 3.4 oz) 107.6 kg (237 lb 3.4 oz)    Examination:  General exam: Sleeping. Respiratory system: Minimal expiratory wheezing anterior lung fields.  Upper airway noise.  No rhonchi.  No crackles noted.  Respiratory effort normal. Cardiovascular system: S1 & S2 heard, RRR. No JVD, murmurs, rubs, gallops or clicks. No pedal edema. Gastrointestinal system: Abdomen is soft, obese, tender to palpation diffusely, positive bowel sounds.  No rebound.  No guarding.  Central nervous system: Sleeping however easily arousable.  Moving  extremities spontaneously.  No focal neurological deficits. Extremities: Symmetric 5 x 5 power. Skin: No rashes, lesions or ulcers Psychiatry: Judgement and insight appear normal. Mood & affect appropriate.     Data Reviewed: I have personally reviewed following labs and imaging studies  CBC: Recent Labs  Lab 05/22/17 1704 05/23/17 0536  WBC 7.6 5.2  HGB 7.3* 6.8*  HCT 23.4* 21.5*  MCV 63.4* 64.4*  PLT 205 950   Basic Metabolic Panel: Recent Labs  Lab 05/22/17 1704 05/22/17 2200 05/23/17 0524 05/23/17 0746  NA 137 139 139  --   K 6.8* 5.7* 5.3* 5.3*  CL 112* 113* 109  --   CO2 15* 16* 19*  --   GLUCOSE 148* 102* 87  --   BUN 83* 82* 72*  --   CREATININE 2.35* 2.11* 1.75*  --   CALCIUM 9.3 9.3 8.6*  --   PHOS  --  3.9 4.4  --    GFR: Estimated Creatinine Clearance: 28.6 mL/min (A) (by C-G formula  based on SCr of 1.75 mg/dL (H)). Liver Function Tests: Recent Labs  Lab 05/22/17 1704 05/22/17 2200 05/23/17 0524  AST 25  --   --   ALT 25  --   --   ALKPHOS 158*  --   --   BILITOT 0.4  --   --   PROT 7.5  --   --   ALBUMIN 3.3* 3.1* 2.8*   Recent Labs  Lab 05/22/17 1704 05/23/17 0746  LIPASE 76* 45  AMYLASE 184*  --    No results for input(s): AMMONIA in the last 168 hours. Coagulation Profile: No results for input(s): INR, PROTIME in the last 168 hours. Cardiac Enzymes: No results for input(s): CKTOTAL, CKMB, CKMBINDEX, TROPONINI in the last 168 hours. BNP (last 3 results) No results for input(s): PROBNP in the last 8760 hours. HbA1C: No results for input(s): HGBA1C in the last 72 hours. CBG: Recent Labs  Lab 05/22/17 2241 05/22/17 2359 05/23/17 0435  GLUCAP 84 71 80   Lipid Profile: No results for input(s): CHOL, HDL, LDLCALC, TRIG, CHOLHDL, LDLDIRECT in the last 72 hours. Thyroid Function Tests: No results for input(s): TSH, T4TOTAL, FREET4, T3FREE, THYROIDAB in the last 72 hours. Anemia Panel: Recent Labs    05/22/17 1704  TIBC 276    IRON 20*   Sepsis Labs: Recent Labs  Lab 05/23/17 0153 05/23/17 0525  LATICACIDVEN 0.8 0.6    Recent Results (from the past 240 hour(s))  MRSA PCR Screening     Status: None   Collection Time: 05/23/17  2:17 AM  Result Value Ref Range Status   MRSA by PCR NEGATIVE NEGATIVE Final    Comment:        The GeneXpert MRSA Assay (FDA approved for NASAL specimens only), is one component of a comprehensive MRSA colonization surveillance program. It is not intended to diagnose MRSA infection nor to guide or monitor treatment for MRSA infections. Performed at [facility], [place] [address], [place], [place] [address]          Radiology Studies: Ct Abdomen Pelvis Wo Contrast  Result Date: 05/22/2017 CLINICAL DATA:  Abdominal pain with hyperkalemia EXAM: CT ABDOMEN AND PELVIS WITHOUT CONTRAST TECHNIQUE: Multidetector CT imaging of the abdomen and pelvis was performed following the standard protocol without IV contrast. COMPARISON:  CT 03/24/2016, ultrasound 05/22/2017, CT 02/10/2015, 03/24/2016, 05/03/2009 FINDINGS: Lower chest: Lung bases demonstrate no acute consolidation or pleural effusion. Borderline cardiomegaly. Stable nodule in the low retroareolar left breast, presumed benign given lack change. Coronary vascular calcification Hepatobiliary: No focal liver abnormality is seen. Status post cholecystectomy. No biliary dilatation. Pancreas: Unremarkable. No pancreatic ductal dilatation or surrounding inflammatory changes. Spleen: Normal in size without focal abnormality. Adrenals/Urinary Tract: Right adrenal gland is normal. 3.4 cm left adrenal gland mass slow growth over time but felt consistent with adenoma. Mildly hyperdense 3.8 cm mass inferior pole right kidney, slow enlargement over time. Mild to moderate left hydronephrosis and hydroureter. No ureteral stone. Bladder within normal limits. Stomach/Bowel: Stomach nonenlarged. No dilated small bowel. Diverticular  disease of the colon without acute thickening. Vascular/Lymphatic: Moderate aortic atherosclerosis. No aneurysmal dilatation. No significantly enlarged lymph nodes. Reproductive: Status post hysterectomy. No adnexal masses. Other: Negative for free air or free fluid. Musculoskeletal: Degenerative changes of the spine. No acute or suspicious abnormality IMPRESSION: 1. Mild to moderate left hydronephrosis and hydroureter, but without ureteral stone identified. 2. Diffuse diverticular disease of the colon without acute inflammation 3. 3.4 cm left adrenal gland adenoma 4.  Mildly hyperdense right inferior renal mass, slow enlargement since 2011 Electronically Signed   By: Donavan Foil M.D.   On: 05/22/2017 21:30   US Renal  Result Date: 05/22/2017 CLINICAL DATA:  Acute kidney injury. EXAM: RENAL / URINARY TRACT ULTRASOUND COMPLETE COMPARISON:  03/24/2016 CT abdomen/pelvis. FINDINGS: Right Kidney: Length: 11.4 cm. No right hydronephrosis. Echogenic right renal parenchyma, which is normal in thickness. No right renal mass. Left Kidney: Length: 13.3 cm. No left hydronephrosis. Echogenic left renal parenchyma, which is normal in thickness. No left renal mass. Bladder: Bladder is not visualized on this scan significantly limited by patient body habitus. IMPRESSION: 1. No hydronephrosis. 2. Echogenic kidneys, indicative of nonspecific renal parenchymal disease of uncertain chronicity. 3. Bladder not visualized on this scan, which is significantly limited by patient body habitus. Electronically Signed   By: Ilona Sorrel M.D.   On: 05/22/2017 20:58   Dg Abdomen Acute W/chest  Result Date: 05/22/2017 CLINICAL DATA:  Abdominal pain and distension. Hyperkalemia. History of diabetes. EXAM: DG ABDOMEN ACUTE W/ 1V CHEST COMPARISON:  Chest radiograph January 13, 2016 FINDINGS: Cardiac silhouette is similarly enlarged with pulmonary vascular congestion. Mildly elevated RIGHT hemidiaphragm. No pleural effusion or focal  consolidation. No pneumothorax. Soft tissue planes and included osseous structures are nonsuspicious. Bowel gas pattern is nondilated and nonobstructive. No intra-abdominal mass effect. Surgical clips in the included right abdomen compatible with cholecystectomy. No intraperitoneal free air. Phleboliths in the pelvis. Soft tissue planes included osseous structures are nonsuspicious. Large body habitus. IMPRESSION: 1. Mild cardiomegaly and pulmonary vascular congestion. No acute pulmonary process. 2. Normal bowel gas pattern. Electronically Signed   By: Elon Alas M.D.   On: 05/22/2017 18:14        Scheduled Meds: . allopurinol  300 mg Oral Daily  . docusate sodium  100 mg Oral Q12H  . furosemide  20 mg Intravenous Once  . insulin aspart  0-15 Units Subcutaneous Q4H  . iron polysaccharides  150 mg Oral BID  . pantoprazole  80 mg Oral Q1200  . sodium polystyrene  45 g Oral Once   Continuous Infusions: . sodium chloride Stopped (05/22/17 2225)  . sodium chloride    . sodium chloride    . cefTRIAXone (ROCEPHIN)  IV    .  sodium bicarbonate (isotonic) infusion in sterile water 100 mL/hr at 05/23/17 0856     LOS: 1 day    Time spent: 40 minutes    Irine Seal, MD [facility] Pager [phone]  If 7PM-7AM, please contact night-coverage [email or web address] Password [facility] 05/23/2017, 9:56 AM

## 2017-05-23 NOTE — ED Notes (Signed)
ED TO INPATIENT HANDOFF REPORT  Name/Age/Gender Lauren Crosby 84 y.o. female  Code Status    Code Status Orders  (From admission, onward)        Start     Ordered   05/22/17 2250  Full code  Continuous     05/22/17 2250    Code Status History    Date Active Date Inactive Code Status Order ID Comments User Context   01/13/2017 0250 01/15/2017 1622 Full Code [id number]  Jani Gravel, MD ED   08/11/2013 1955 08/13/2013 1559 Full Code [id number]  Babish, Lucille Passy, PA-C Inpatient      Home/SNF/Other Home  Chief Complaint K+ 6.5  Level of Care/Admitting Diagnosis ED Disposition    ED Disposition Condition [place]: [facility] [[id number]]  Level of Care: Stepdown [14]  Admit to SDU based on following criteria: Cardiac Instability:  Patients experiencing chest pain, unconfirmed MI and stable, arrhythmias and CHF requiring medical management and potentially compromising patient's stability  Admit to SDU based on following criteria: Other see comments  Comments: K 6.8  Diagnosis: Acute kidney failure [facility]) [[id number]]  Admitting Physician: Doreatha Massed  Attending Physician: Etta Quill [phone]  Estimated length of stay: past midnight tomorrow  Certification:: I certify this patient will need inpatient services for at least 2 midnights  PT Class (Do Not Modify): Inpatient [101]  PT Acc Code (Do Not Modify): Private [1]       Medical History Past Medical History:  Diagnosis Date  . Adrenal adenoma   . Anemia   . Arthritis    OA AND PAIN BOTH KNEES BUT RIGHT KNEE PAIN WORSE; SOME LOWER BACK PAIN  . Cellulitis and abscess of trunk   . Colon polyp    adenomatous  . Diabetes ([place])    type 2  PT IS ON ORAL MEDICATION AND INSULIN  . Diverticulitis   . GERD (gastroesophageal reflux disease)    NO MEDS FOR GERD  . Goiter   . Gout   . Hepatitis    YELLOW JAUNDICE WHEN A CHILD  . Hernia   . Hyperlipemia   . Hypertension    . Nephrolithiasis   . 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 Obesity   . Open wound of abdominal wall, anterior, without mention of complication   . Pneumonia    IN THE PAST  . Retinopathy due to secondary diabetes mellitus ([place])   . Shortness of breath    PT IS NOT ABLE TO BE ACTIVE BECAUSE OF KNEE PROBLEM; HAS ALWAYS HAD PROBLEM WITH BREATHING HEAVY - DOES GET SOB WITH EXERTION.  Marland Kitchen Thalassemia minor     Allergies Allergies  Allergen Reactions  . Penicillins Other (See Comments)    Has patient had a PCN reaction causing immediate rash, facial/tongue/throat swelling, SOB or lightheadedness with hypotension: No Has patient had a PCN reaction causing severe rash involving mucus membranes or skin necrosis: No Has patient had a PCN reaction that required hospitalization: No Has patient had a PCN reaction occurring within the last 10 years: Unknown If all of the above answers are "NO", then may proceed with Cephalosporin use.    IV Location/Drains/Wounds Patient Lines/Drains/Airways Status   Active Line/Drains/Airways    Name:   Placement date:   Placement time:   Site:   Days:   Peripheral IV 05/22/17 Right Forearm   05/22/17    1854    Forearm  1   Peripheral IV 05/22/17 Left Hand   05/22/17    2224    Hand   1   Urethral Catheter Steph Malacki Mcphearson, RN Straight-tip 14 Fr.   05/22/17    2304    Straight-tip   1   Incision (Closed) 08/11/13 Knee Right   08/11/13    1642     1381          Labs/Imaging Results for orders placed or performed during the hospital encounter of 05/22/17 (from the past 48 hour(s))  Lipase, blood     Status: Abnormal   Collection Time: 05/22/17  5:04 PM  Result Value Ref Range   Lipase 76 (H) 11 - 51 U/L    Comment: Performed at [facility], [place] [address], [place], [place] [address]  Comprehensive metabolic panel     Status: Abnormal   Collection Time: 05/22/17  5:04 PM  Result Value Ref Range   Sodium 137 135  - 145 mmol/L   Potassium 6.8 (HH) 3.5 - 5.1 mmol/L    Comment: CRITICAL RESULT CALLED TO, READ BACK BY AND VERIFIED WITH: WEST,S. RN @1757  ON 05.14.19 BY COHEN,K    Chloride 112 (H) 101 - 111 mmol/L   CO2 15 (L) 22 - 32 mmol/L   Glucose, Bld 148 (H) 65 - 99 mg/dL   BUN 83 (H) 6 - 20 mg/dL   Creatinine, Ser 2.35 (H) 0.44 - 1.00 mg/dL   Calcium 9.3 8.9 - 10.3 mg/dL   Total Protein 7.5 6.5 - 8.1 g/dL   Albumin 3.3 (L) 3.5 - 5.0 g/dL   AST 25 15 - 41 U/L   ALT 25 14 - 54 U/L   Alkaline Phosphatase 158 (H) 38 - 126 U/L   Total Bilirubin 0.4 0.3 - 1.2 mg/dL   GFR calc non Af Amer 18 (L) >60 mL/min   GFR calc Af Amer 21 (L) >60 mL/min    Comment: (NOTE) The eGFR has been calculated using the CKD EPI equation. This calculation has not been validated in all clinical situations. eGFR's persistently <60 mL/min signify possible Chronic Kidney Disease.    Anion gap 10 5 - 15    Comment: Performed at [facility], [place] [address], [place], [place] [address]  CBC     Status: Abnormal   Collection Time: 05/22/17  5:04 PM  Result Value Ref Range   WBC 7.6 4.0 - 10.5 K/uL   RBC 3.69 (L) 3.87 - 5.11 MIL/uL   Hemoglobin 7.3 (L) 12.0 - 15.0 g/dL   HCT 23.4 (L) 36.0 - 46.0 %   MCV 63.4 (L) 78.0 - 100.0 fL   MCH 19.8 (L) 26.0 - 34.0 pg   MCHC 31.2 30.0 - 36.0 g/dL   RDW 18.3 (H) 11.5 - 15.5 %   Platelets 205 150 - 400 K/uL    Comment: Performed at [facility], [place] [address] [place], [place] [address]  Amylase     Status: Abnormal   Collection Time: 05/22/17  5:04 PM  Result Value Ref Range   Amylase 184 (H) 28 - 100 U/L    Comment: Performed at [facility], [place] [address], [place], [place] [address]  Iron and TIBC     Status: Abnormal   Collection Time: 05/22/17  5:04 PM  Result Value Ref Range   Iron 20 (L) 28 - 170 ug/dL   TIBC 276 250 - 450 ug/dL   Saturation Ratios 7 (  L) 10.4 - 31.8 %   UIBC 256 ug/dL    Comment: Performed  at [facility], [place] [address] [place], [place] [address]  Type and screen [place]     Status: None (Preliminary result)   Collection Time: 05/22/17  6:58 PM  Result Value Ref Range   ABO/RH(D) A POS    Antibody Screen NEG    Sample Expiration 05/25/2017    Unit Number [id number]    Blood Component Type RED CELLS,LR    Unit division 00    Status of Unit ISSUED    Transfusion Status OK TO TRANSFUSE    Crossmatch Result      Compatible Performed at [facility], [place] [address], [place], [place] [address]   Prepare RBC     Status: None   Collection Time: 05/22/17  8:30 PM  Result Value Ref Range   Order Confirmation      ORDER PROCESSED BY BLOOD BANK Performed at [facility], [place] [address], [place], [place] [address]   Sodium, urine, random     Status: None   Collection Time: 05/22/17  8:52 PM  Result Value Ref Range   Sodium, Ur 65 mmol/L    Comment: Performed at [facility], [place] [address] [place], [place] [address]  Creatinine, urine, random     Status: None   Collection Time: 05/22/17  8:52 PM  Result Value Ref Range   Creatinine, Urine 61.58 mg/dL    Comment: Performed at [facility], [place] [address] [place], [place] [address]  Urinalysis, Routine w reflex microscopic     Status: Abnormal   Collection Time: 05/22/17  8:52 PM  Result Value Ref Range   Color, Urine YELLOW YELLOW   APPearance TURBID (A) CLEAR   Specific Gravity, Urine 1.011 1.005 - 1.030   pH 5.0 5.0 - 8.0   Glucose, UA NEGATIVE NEGATIVE mg/dL   Hgb urine dipstick SMALL (A) NEGATIVE   Bilirubin Urine NEGATIVE NEGATIVE   Ketones, ur NEGATIVE NEGATIVE mg/dL   Protein, ur 100 (A) NEGATIVE mg/dL   Nitrite NEGATIVE NEGATIVE   Leukocytes, UA LARGE (A) NEGATIVE   RBC / HPF 6-10 0 - 5 RBC/hpf   WBC, UA >50 (H) 0 - 5 WBC/hpf   Bacteria, UA MANY (A) NONE SEEN   Squamous Epithelial / LPF 0-5 0 - 5    WBC Clumps PRESENT     Comment: Performed at [facility], [place] [address] [place],  [address]  Renal function panel     Status: Abnormal   Collection Time: 05/22/17 10:00 PM  Result Value Ref Range   Sodium 139 135 - 145 mmol/L   Potassium 5.7 (H) 3.5 - 5.1 mmol/L   Chloride 113 (H) 101 - 111 mmol/L   CO2 16 (L) 22 - 32 mmol/L   Glucose, Bld 102 (H) 65 - 99 mg/dL   BUN 82 (H) 6 - 20 mg/dL   Creatinine, Ser 2.11 (H) 0.44 - 1.00 mg/dL   Calcium 9.3 8.9 - 10.3 mg/dL   Phosphorus 3.9 2.5 - 4.6 mg/dL   Albumin 3.1 (L) 3.5 - 5.0 g/dL   GFR calc non Af Amer 21 (L) >60 mL/min   GFR calc Af Amer 24 (L) >60 mL/min    Comment: (NOTE) The eGFR has been calculated using the CKD EPI equation. This calculation has not been validated in all clinical situations. eGFR's persistently <60 mL/min  signify possible Chronic Kidney Disease.    Anion gap 10 5 - 15    Comment: Performed at [facility], [place] [address] [place], [place] [address]  POC CBG, ED     Status: None   Collection Time: 05/22/17 10:41 PM  Result Value Ref Range   Glucose-Capillary 84 65 - 99 mg/dL   Comment 1 Notify RN   CBG monitoring, ED     Status: None   Collection Time: 05/22/17 11:59 PM  Result Value Ref Range   Glucose-Capillary 71 65 - 99 mg/dL   Ct Abdomen Pelvis Wo Contrast  Result Date: 05/22/2017 CLINICAL DATA:  Abdominal pain with hyperkalemia EXAM: CT ABDOMEN AND PELVIS WITHOUT CONTRAST TECHNIQUE: Multidetector CT imaging of the abdomen and pelvis was performed following the standard protocol without IV contrast. COMPARISON:  CT 03/24/2016, ultrasound 05/22/2017, CT 02/10/2015, 03/24/2016, 05/03/2009 FINDINGS: Lower chest: Lung bases demonstrate no acute consolidation or pleural effusion. Borderline cardiomegaly. Stable nodule in the low retroareolar left breast, presumed benign given lack change. Coronary vascular calcification Hepatobiliary: No focal liver abnormality  is seen. Status post cholecystectomy. No biliary dilatation. Pancreas: Unremarkable. No pancreatic ductal dilatation or surrounding inflammatory changes. Spleen: Normal in size without focal abnormality. Adrenals/Urinary Tract: Right adrenal gland is normal. 3.4 cm left adrenal gland mass slow growth over time but felt consistent with adenoma. Mildly hyperdense 3.8 cm mass inferior pole right kidney, slow enlargement over time. Mild to moderate left hydronephrosis and hydroureter. No ureteral stone. Bladder within normal limits. Stomach/Bowel: Stomach nonenlarged. No dilated small bowel. Diverticular disease of the colon without acute thickening. Vascular/Lymphatic: Moderate aortic atherosclerosis. No aneurysmal dilatation. No significantly enlarged lymph nodes. Reproductive: Status post hysterectomy. No adnexal masses. Other: Negative for free air or free fluid. Musculoskeletal: Degenerative changes of the spine. No acute or suspicious abnormality IMPRESSION: 1. Mild to moderate left hydronephrosis and hydroureter, but without ureteral stone identified. 2. Diffuse diverticular disease of the colon without acute inflammation 3. 3.4 cm left adrenal gland adenoma 4. Mildly hyperdense right inferior renal mass, slow enlargement since 2011 Electronically Signed   By: Donavan Foil M.D.   On: 05/22/2017 21:30   US Renal  Result Date: 05/22/2017 CLINICAL DATA:  Acute kidney injury. EXAM: RENAL / URINARY TRACT ULTRASOUND COMPLETE COMPARISON:  03/24/2016 CT abdomen/pelvis. FINDINGS: Right Kidney: Length: 11.4 cm. No right hydronephrosis. Echogenic right renal parenchyma, which is normal in thickness. No right renal mass. Left Kidney: Length: 13.3 cm. No left hydronephrosis. Echogenic left renal parenchyma, which is normal in thickness. No left renal mass. Bladder: Bladder is not visualized on this scan significantly limited by patient body habitus. IMPRESSION: 1. No hydronephrosis. 2. Echogenic kidneys, indicative of  nonspecific renal parenchymal disease of uncertain chronicity. 3. Bladder not visualized on this scan, which is significantly limited by patient body habitus. Electronically Signed   By: Ilona Sorrel M.D.   On: 05/22/2017 20:58   Dg Abdomen Acute W/chest  Result Date: 05/22/2017 CLINICAL DATA:  Abdominal pain and distension. Hyperkalemia. History of diabetes. EXAM: DG ABDOMEN ACUTE W/ 1V CHEST COMPARISON:  Chest radiograph January 13, 2016 FINDINGS: Cardiac silhouette is similarly enlarged with pulmonary vascular congestion. Mildly elevated RIGHT hemidiaphragm. No pleural effusion or focal consolidation. No pneumothorax. Soft tissue planes and included osseous structures are nonsuspicious. Bowel gas pattern is nondilated and nonobstructive. No intra-abdominal mass effect. Surgical clips in the included right abdomen compatible with cholecystectomy. No intraperitoneal free air. Phleboliths in the pelvis. Soft tissue planes included osseous structures  are nonsuspicious. Large body habitus. IMPRESSION: 1. Mild cardiomegaly and pulmonary vascular congestion. No acute pulmonary process. 2. Normal bowel gas pattern. Electronically Signed   By: Elon Alas M.D.   On: 05/22/2017 18:14    Pending Labs Unresulted Labs (From admission, onward)   Start     Ordered   05/23/17 0500  Renal function panel  Daily,   R     05/22/17 1945   05/23/17 0500  CBC  Daily,   R     05/22/17 1946   05/22/17 1947  Lactic acid, plasma  STAT Now then every 3 hours,   R     05/22/17 1946   05/22/17 1940  Parathyroid hormone, intact (no Ca)  Once,   R     05/22/17 1942   Unscheduled  Occult blood card to lab, stool RN will collect  As needed,   R    Question:  Specimen to be collected by?  Answer:  RN will collect   05/23/17 0010      Vitals/Pain Today's Vitals   05/22/17 2244 05/22/17 2303 05/22/17 2312 05/23/17 0000  BP: (!) 160/55 (!) 148/39  (!) 160/55  Pulse: 95 95  96  Resp: (!) 28 (!) 24  (!) 27  Temp: (!)  97.5 F (36.4 C) 97.6 F (36.4 C)    TempSrc: Oral Oral    SpO2: 100% 100%  99%  PainSc:   5      Isolation Precautions No active isolations  Medications Medications  sodium bicarbonate 150 mEq in sterile water 1,000 mL infusion ( Intravenous Transfusing/Transfer 05/23/17 0053)  iohexol (OMNIPAQUE) 300 MG/ML solution 30 mL (has no administration in time range)  cefTRIAXone (ROCEPHIN) 2 g in sodium chloride 0.9 % 100 mL IVPB (has no administration in time range)  acetaminophen (TYLENOL) tablet 650 mg (has no administration in time range)    Or  acetaminophen (TYLENOL) suppository 650 mg (has no administration in time range)  ondansetron (ZOFRAN) tablet 4 mg (has no administration in time range)    Or  ondansetron (ZOFRAN) injection 4 mg (has no administration in time range)  insulin aspart (novoLOG) injection 0-15 Units (0 Units Subcutaneous Not Given 05/23/17 0039)  cefTRIAXone (ROCEPHIN) 1 g in sodium chloride 0.9 % 100 mL IVPB (has no administration in time range)  allopurinol (ZYLOPRIM) tablet 300 mg (has no administration in time range)  docusate sodium (COLACE) capsule 100 mg (has no administration in time range)  pantoprazole (PROTONIX) EC tablet 80 mg (has no administration in time range)  iron polysaccharides (NIFEREX) capsule 150 mg (has no administration in time range)  HYDROmorphone (DILAUDID) injection 0.5-1 mg (has no administration in time range)  calcium gluconate 1 g in sodium chloride 0.9 % 100 mL IVPB (0 g Intravenous Stopped 05/22/17 1942)  insulin aspart (novoLOG) injection 10 Units (10 Units Intravenous Given 05/22/17 1841)  dextrose 50 % solution 50 mL (50 mLs Intravenous Given 05/22/17 1841)  albuterol (PROVENTIL) (2.5 MG/3ML) 0.083% nebulizer solution 5 mg (5 mg Nebulization Given 05/22/17 2000)  sodium bicarbonate injection 25 mEq (25 mEq Intravenous Given 05/22/17 1952)  sodium polystyrene (KAYEXALATE) 15 GM/60ML suspension 60 g (60 g Rectal Given 05/22/17 2125)   0.9 %  sodium chloride infusion ( Intravenous New Bag/Given 05/22/17 2225)  HYDROmorphone (DILAUDID) injection 1 mg (1 mg Intravenous Given 05/22/17 2223)    Mobility non-ambulatory

## 2017-05-24 ENCOUNTER — Inpatient Hospital Stay (HOSPITAL_COMMUNITY): Payer: Medicare HMO

## 2017-05-24 DIAGNOSIS — B373 Candidiasis of vulva and vagina: Secondary | ICD-10-CM | POA: Diagnosis present

## 2017-05-24 DIAGNOSIS — B3731 Acute candidiasis of vulva and vagina: Secondary | ICD-10-CM | POA: Diagnosis present

## 2017-05-24 DIAGNOSIS — R1030 Lower abdominal pain, unspecified: Secondary | ICD-10-CM

## 2017-05-24 DIAGNOSIS — L308 Other specified dermatitis: Secondary | ICD-10-CM

## 2017-05-24 DIAGNOSIS — R109 Unspecified abdominal pain: Secondary | ICD-10-CM | POA: Clinically undetermined

## 2017-05-24 DIAGNOSIS — K567 Ileus, unspecified: Secondary | ICD-10-CM | POA: Clinically undetermined

## 2017-05-24 DIAGNOSIS — K598 Other specified functional intestinal disorders: Secondary | ICD-10-CM

## 2017-05-24 LAB — TYPE AND SCREEN
ABO/RH(D): A POS
ANTIBODY SCREEN: NEGATIVE
Unit division: 0
Unit division: 0
Unit division: 0

## 2017-05-24 LAB — CBC
HCT: 31.9 % — ABNORMAL LOW (ref 36.0–46.0)
Hemoglobin: 10.6 g/dL — ABNORMAL LOW (ref 12.0–15.0)
MCH: 22.8 pg — ABNORMAL LOW (ref 26.0–34.0)
MCHC: 33.2 g/dL (ref 30.0–36.0)
MCV: 68.8 fL — AB (ref 78.0–100.0)
PLATELETS: 164 10*3/uL (ref 150–400)
RBC: 4.64 MIL/uL (ref 3.87–5.11)
RDW: 22.4 % — ABNORMAL HIGH (ref 11.5–15.5)
WBC: 3.7 10*3/uL — AB (ref 4.0–10.5)

## 2017-05-24 LAB — BPAM RBC
BLOOD PRODUCT EXPIRATION DATE: 201906042359
BLOOD PRODUCT EXPIRATION DATE: 201906042359
Blood Product Expiration Date: 201906042359
ISSUE DATE / TIME: 201905142235
ISSUE DATE / TIME: 201905150814
ISSUE DATE / TIME: 201905151130
UNIT TYPE AND RH: 6200
Unit Type and Rh: 6200
Unit Type and Rh: 6200

## 2017-05-24 LAB — GLUCOSE, CAPILLARY
GLUCOSE-CAPILLARY: 146 mg/dL — AB (ref 65–99)
GLUCOSE-CAPILLARY: 153 mg/dL — AB (ref 65–99)
GLUCOSE-CAPILLARY: 171 mg/dL — AB (ref 65–99)
Glucose-Capillary: 160 mg/dL — ABNORMAL HIGH (ref 65–99)
Glucose-Capillary: 182 mg/dL — ABNORMAL HIGH (ref 65–99)
Glucose-Capillary: 147 mg/dL — ABNORMAL HIGH (ref 65–99)

## 2017-05-24 LAB — RENAL FUNCTION PANEL
Albumin: 2.9 g/dL — ABNORMAL LOW (ref 3.5–5.0)
Anion gap: 14 (ref 5–15)
BUN: 48 mg/dL — AB (ref 6–20)
CALCIUM: 8.2 mg/dL — AB (ref 8.9–10.3)
CO2: 23 mmol/L (ref 22–32)
CREATININE: 1.27 mg/dL — AB (ref 0.44–1.00)
Chloride: 105 mmol/L (ref 101–111)
GFR calc Af Amer: 44 mL/min — ABNORMAL LOW (ref >60)
GFR, EST NON AFRICAN AMERICAN: 38 mL/min — AB (ref >60)
Glucose, Bld: 157 mg/dL — ABNORMAL HIGH (ref 65–99)
Phosphorus: 3.5 mg/dL (ref 2.5–4.6)
Potassium: 4 mmol/L (ref 3.5–5.1)
SODIUM: 142 mmol/L (ref 135–145)

## 2017-05-24 LAB — URINE CULTURE

## 2017-05-24 LAB — HEMOGLOBIN A1C
HEMOGLOBIN A1C: 6.7 % — AB (ref 4.8–5.6)
Mean Plasma Glucose: 145.59 mg/dL

## 2017-05-24 LAB — MAGNESIUM: MAGNESIUM: 1.4 mg/dL — AB (ref 1.7–2.4)

## 2017-05-24 MED ORDER — CLOTRIMAZOLE 1 % VA CREA
1.0000 | TOPICAL_CREAM | Freq: Every day | VAGINAL | Status: AC
  Administered 2017-05-24 – 2017-05-30 (×6): 1 via VAGINAL
  Filled 2017-05-24 (×2): qty 45

## 2017-05-24 MED ORDER — WHITE PETROLATUM EX OINT: TOPICAL_OINTMENT | CUTANEOUS | Status: DC | PRN

## 2017-05-24 MED ORDER — PEG-KCL-NACL-NASULF-NA ASC-C 100 G PO SOLR: 0.5000 | Freq: Once | ORAL | Status: DC

## 2017-05-24 MED ORDER — SODIUM CHLORIDE 0.9 % IV SOLN
INTRAVENOUS | Status: DC
  Administered 2017-05-24 – 2017-05-25 (×3): via INTRAVENOUS

## 2017-05-24 MED ORDER — SODIUM CHLORIDE 0.9 % IV SOLN
INTRAVENOUS | Status: DC
  Filled 2017-05-24: qty 1000

## 2017-05-24 MED ORDER — ORAL CARE MOUTH RINSE
15.0000 mL | Freq: Two times a day (BID) | OROMUCOSAL | Status: DC
  Administered 2017-05-24 – 2017-05-29 (×11): 15 mL via OROMUCOSAL

## 2017-05-24 MED ORDER — PEG-KCL-NACL-NASULF-NA ASC-C 100 G PO SOLR
0.5000 | Freq: Once | ORAL | Status: DC
  Filled 2017-05-24: qty 1

## 2017-05-24 MED ORDER — MAGNESIUM SULFATE 4 GM/100ML IV SOLN
4.0000 g | Freq: Once | INTRAVENOUS | Status: AC
  Administered 2017-05-24: 4 g via INTRAVENOUS
  Filled 2017-05-24: qty 100

## 2017-05-24 MED ORDER — HYDRALAZINE HCL 20 MG/ML IJ SOLN
5.0000 mg | Freq: Once | INTRAMUSCULAR | Status: AC
  Administered 2017-05-24: 5 mg via INTRAVENOUS
  Filled 2017-05-24: qty 1

## 2017-05-24 MED ORDER — LIP MEDEX EX OINT: TOPICAL_OINTMENT | CUTANEOUS | Status: DC | PRN

## 2017-05-24 MED ORDER — MORPHINE SULFATE (PF) 4 MG/ML IV SOLN
1.0000 mg | INTRAVENOUS | Status: DC | PRN
  Administered 2017-05-24 – 2017-05-31 (×9): 2 mg via INTRAVENOUS
  Filled 2017-05-24 (×9): qty 1

## 2017-05-24 MED ORDER — IOPAMIDOL (ISOVUE-300) INJECTION 61%
100.0000 mL | Freq: Once | INTRAVENOUS | Status: AC | PRN
  Administered 2017-05-24: 75 mL via INTRAVENOUS

## 2017-05-24 MED ORDER — PEG-KCL-NACL-NASULF-NA ASC-C 100 G PO SOLR: 1.0000 | Freq: Two times a day (BID) | ORAL | Status: DC

## 2017-05-24 MED ORDER — METOPROLOL TARTRATE 5 MG/5ML IV SOLN
5.0000 mg | Freq: Three times a day (TID) | INTRAVENOUS | Status: DC
  Administered 2017-05-24 – 2017-05-26 (×6): 5 mg via INTRAVENOUS
  Filled 2017-05-24 (×6): qty 5

## 2017-05-24 MED ORDER — IOPAMIDOL (ISOVUE-300) INJECTION 61%
INTRAVENOUS | Status: AC
  Filled 2017-05-24: qty 100

## 2017-05-24 MED ORDER — IOHEXOL 300 MG/ML  SOLN
30.0000 mL | Freq: Once | INTRAMUSCULAR | Status: AC | PRN
  Administered 2017-05-24: 30 mL via ORAL

## 2017-05-24 NOTE — Progress Notes (Signed)
Rn attempted to place ng x2. Unable to pass through nose,pt screaming and crying the whole time. Patient told RN to stop. Stopped procedure. Had educated pt and her husband beforehand about the importance of this tube. Pt's husband at bedside trying to convince her to let us try again. Ellamae Sia

## 2017-05-24 NOTE — Progress Notes (Signed)
PROGRESS NOTE    Lauren Crosby  VZC:[id number] DOB: June 30, 1931 DOA: 05/22/2017 PCP: [name], MD    Brief Narrative:  Lauren Crosby is a 84 y.o. female with medical history significant of DM2, HTN, nephrolithiasis x7 times (last 6-7 years ago), diverticulitis.  Patient has had progressive abd pain for 1 week with poor PO intake.  Abd pain is diffuse, no V/D.  No fevers nor chills.  No dysuria.  Patient went to PCPs office today, sent in to ED for abnormal labs.   ED Course: Cr 2.35 (up from baseline 1.1), bicarb 15, K 6.8.  [place] was negative for hydronephrosis.  Dr. Jimmy Footman consulted.  CT abd/pelvis actually came back positive for L hydronephrosis.  CT also has a slow growing R infrarenal mass slow enlargement since 2011, also shows a 3.4cm L adrenal adenoma which is known based on PMHx (confident that CT scan is on the correct patient).  Urology and nephrology consulted and following.    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  Patient noted to be hyperkalemic with a acidosis as well.  CT abdomen and pelvis consistent with left hydronephrosis with questionable etiology/obstructive uropathy.  Foley catheter has been placed.  Patient with a urine output of 3.750 L overnight.  Creatinine currently at 1.27 from 1.75 from 2.35 on admission.  Decrease bicarb  drip to 75 cc/h.  Urology and nephrology following and appreciate input and recommendations.   2.  Left hydronephrosis Questionable etiology.  Patient assessed by urology who feel could be secondary to pyelonephritis versus obstructive uropathy secondary to diabetic cytopathy versus bladder cancer recurrence.  Foley catheter in place.  Patient with a urine output of 3.750 L overnight.  Creatinine slowly trending down.  Urinalysis with large leukocytes, negative nitrite, greater than 50 WBCs.  Urine cultures with less than 10,000 colonies however urinalysis significantly consistent with UTI.  We will continue empiric IV Rocephin for now.  Urology following.   3 abdominal pain Patient complaining of significant abdominal pain more in the lower abdomen.  Concern for ileus versus small bowel obstruction.  Patient stated had a bowel movement 2 days ago.  Abdomen is more distended, tighter with more hypoactive bowel sounds.  We will get two-view abdominal films.  Keep potassium greater than 4.  Check a magnesium level and keep greater than 2.  4.  Probable UTI Urinalysis consistent with a urinary tract infection.  Urine cultures with less than 10,000 colonies.  Urinalysis however turbid, large leukocytes, greater than 50 WBCs, WBC clumps present, many bacteria.  Continue empiric IV Rocephin.  5.  Acute on chronic microcytic anemia/iron deficiency anemia/history of thalassemia Hemoglobin was at 7.3 on admission from 9.9 January 12, 2017.  Hemoglobin this morning is 10.6 from 9.8 from 6.8 after transfusion of 3 units of packed red blood cells on 05/23/2017.Marland Kitchen  Patient denies any  overt bleeding.  No overt bleeding noted per RN.  May need a dose of IV iron.  Continue oral iron supplementation.  Continue PPI daily.  Patient with prior history of gastritis per endoscopy of 2005 and history of colonic polyps per colonoscopy of 2008.  Patient seen in consultation by GI and patient scheduled for probable EGD/colonoscopy  tomorrow.  GI following and appreciate input and recommendations.  Follow H&H.  6.  MASD Continue current wound care.  7.  Hyperkalemia Secondary to problem #1.  Improved with Kayexalate.  Potassium currently at 4.0.  Nephrology following.    8.  Hypertension ACE inhibitor on hold will likely not resume for at least 8 weeks due to problem #1.  Blood pressure somewhat elevated.  Place on IV Lopressor for now.    9.  Well controlled diabetes mellitus type 2 Hemoglobin A1c was 6.6 on 06/13/2016.  Hemoglobin A1c is 6.7.  CBGs ranging from 146-180.  Continue to hold oral hypoglycemic agents.  Continue sliding scale insulin.   10 acidosis Secondary to problem #1.  Improved on bicarb drip.  Decrease rate of bicarb drip to 75 cc an hour as patient currently n.p.o. and follow. Per nephrology.  11 gastroesophageal reflux disease Continue PPI.  12 probable vaginal candidiasis Continue IV Diflucan for now.  Clotrimazole cream.    DVT prophylaxis: SCDs Code Status: Full Family Communication: Updated patient.  No family at bedside. Disposition Plan: Remain in stepdown unit.   Consultants:   Nephrology: Dr.Deterding 05/22/2017  Urology: Dr.Narang 05/22/2017  Procedures:   CT abdomen and pelvis 05/22/2017  Acute abdominal series 05/22/2017  Renal ultrasound 05/22/2017  1 unit of packed red blood cells 05/22/2017  2 Units packed red blood cells pending 4 05/23/2017  Antimicrobials:   IV Rocephin 05/22/2017   Subjective: Sleeping however easily arousable.  Complaining of abdominal pain that she states has been ongoing for the past 2 to 3 days and feels its worse today.  No nausea or emesis.  Denies any change in her chronic shortness of breath.  No chest pain.  Denies any rectal bleeding.    Objective: Vitals:   05/24/17 0352 05/24/17 0400 05/24/17 0600 05/24/17 0800  BP:  (!) 173/60 (!) 184/59 (!) 168/59  Pulse:  94 95 100  Resp:  (!) 26 (!) 25 (!) 28  Temp: 98.3 F (36.8 C)    98.4 F (36.9 C)  TempSrc: Oral   Oral  SpO2:  95% 95% 95%  Weight:      Height:        Intake/Output Summary (Last 24 hours) at 05/24/2017 1008 Last data filed at 05/24/2017 0800 Gross per 24 hour  Intake 3102.67 ml  Output 3850 ml  Net -747.33 ml   Filed Weights   05/23/17 0129 05/23/17 0556  Weight: 107.6 kg (237 lb 3.4 oz) 107.6 kg (237 lb 3.4 oz)    Examination:  General exam: Alert Respiratory system: Bibasilar crackles.  No wheezing.  No rhonchi.  Upper airway noise.  Respiratory effort normal. Cardiovascular system: S1 & S2 heard, RRR. No JVD, murmurs, rubs, gallops or clicks. No pedal edema. Gastrointestinal system: Abdomen is tighter, hypoactive bowel sounds, diffuse tenderness to palpation greater in the lower quadrants, distended.  No rebound.  No guarding.   Central nervous system: Alert and oriented.  No focal neurological deficits. Extremities: Symmetric 5 x 5 power. Skin: No rashes, lesions or ulcers Psychiatry: Judgement and insight appear normal. Mood & affect appropriate.     Data  Reviewed: I have personally reviewed following labs and imaging studies  CBC: Recent Labs  Lab 05/22/17 1704 05/23/17 0536 05/23/17 1627 05/24/17 0307  WBC 7.6 5.2  --  3.7*  HGB 7.3* 6.8* 9.8* 10.6*  HCT 23.4* 21.5* 30.4* 31.9*  MCV 63.4* 64.4*  --  68.8*  PLT 205 161  --  038   Basic Metabolic Panel: Recent Labs  Lab 05/22/17 1704 05/22/17 2200 05/23/17 0524 05/23/17 0746 05/23/17 1629 05/24/17 0307  NA 137 139 139  --   --  142  K 6.8* 5.7* 5.3* 5.3* 4.7 4.0  CL 112* 113* 109  --   --  105  CO2 15* 16* 19*  --   --  23  GLUCOSE 148* 102* 87  --   --  157*  BUN 83* 82* 72*  --   --  48*  CREATININE 2.35* 2.11* 1.75*  --   --  1.27*  CALCIUM 9.3 9.3 8.6*  --   --  8.2*  PHOS  --  3.9 4.4  --   --  3.5   GFR: Estimated Creatinine Clearance: 39.5 mL/min (A) (by C-G formula based on SCr of 1.27 mg/dL (H)). Liver Function Tests: Recent Labs  Lab  05/22/17 1704 05/22/17 2200 05/23/17 0524 05/24/17 0307  AST 25  --   --   --   ALT 25  --   --   --   ALKPHOS 158*  --   --   --   BILITOT 0.4  --   --   --   PROT 7.5  --   --   --   ALBUMIN 3.3* 3.1* 2.8* 2.9*   Recent Labs  Lab 05/22/17 1704 05/23/17 0746  LIPASE 76* 45  AMYLASE 184*  --    No results for input(s): AMMONIA in the last 168 hours. Coagulation Profile: No results for input(s): INR, PROTIME in the last 168 hours. Cardiac Enzymes: No results for input(s): CKTOTAL, CKMB, CKMBINDEX, TROPONINI in the last 168 hours. BNP (last 3 results) No results for input(s): PROBNP in the last 8760 hours. HbA1C: Recent Labs    05/24/17 0307  HGBA1C 6.7*   CBG: Recent Labs  Lab 05/23/17 1536 05/23/17 1926 05/23/17 2308 05/24/17 0306 05/24/17 0731  GLUCAP 102* 163* 181* 146* 160*   Lipid Profile: No results for input(s): CHOL, HDL, LDLCALC, TRIG, CHOLHDL, LDLDIRECT in the last 72 hours. Thyroid Function Tests: No results for input(s): TSH, T4TOTAL, FREET4, T3FREE, THYROIDAB in the last 72 hours. Anemia Panel: Recent Labs    05/22/17 1704  TIBC 276  IRON 20*   Sepsis Labs: Recent Labs  Lab 05/23/17 0153 05/23/17 0525  LATICACIDVEN 0.8 0.6    Recent Results (from the past 240 hour(s))  MRSA PCR Screening     Status: None   Collection Time: 05/23/17  2:17 AM  Result Value Ref Range Status   MRSA by PCR NEGATIVE NEGATIVE Final    Comment:        The GeneXpert MRSA Assay (FDA approved for NASAL specimens only), is one component of a comprehensive MRSA colonization surveillance program. It is not intended to diagnose MRSA infection nor to guide or monitor treatment for MRSA infections. Performed at [facility], [place] [address], [place], [place] [address]   Culture, Urine     Status: Abnormal   Collection Time: 05/23/17  8:50 AM  Result Value Ref Range Status   Specimen Description   Final  URINE, CLEAN CATCH Performed at  [facility], [place] [address], [place], [place] [address]    Special Requests   Final    NONE Performed at [facility], [place] [address], [place], [place] [address]    Culture (A)  Final    <10,000 COLONIES/mL INSIGNIFICANT GROWTH Performed at [place] [address], [place], [place] [address]    Report Status 05/24/2017 FINAL  Final         Radiology Studies: Ct Abdomen Pelvis Wo Contrast  Result Date: 05/22/2017 CLINICAL DATA:  Abdominal pain with hyperkalemia EXAM: CT ABDOMEN AND PELVIS WITHOUT CONTRAST TECHNIQUE: Multidetector CT imaging of the abdomen and pelvis was performed following the standard protocol without IV contrast. COMPARISON:  CT 03/24/2016, ultrasound 05/22/2017, CT 02/10/2015, 03/24/2016, 05/03/2009 FINDINGS: Lower chest: Lung bases demonstrate no acute consolidation or pleural effusion. Borderline cardiomegaly. Stable nodule in the low retroareolar left breast, presumed benign given lack change. Coronary vascular calcification Hepatobiliary: No focal liver abnormality is seen. Status post cholecystectomy. No biliary dilatation. Pancreas: Unremarkable. No pancreatic ductal dilatation or surrounding inflammatory changes. Spleen: Normal in size without focal abnormality. Adrenals/Urinary Tract: Right adrenal gland is normal. 3.4 cm left adrenal gland mass slow growth over time but felt consistent with adenoma. Mildly hyperdense 3.8 cm mass inferior pole right kidney, slow enlargement over time. Mild to moderate left hydronephrosis and hydroureter. No ureteral stone. Bladder within normal limits. Stomach/Bowel: Stomach nonenlarged. No dilated small bowel. Diverticular disease of the colon without acute thickening. Vascular/Lymphatic: Moderate aortic atherosclerosis. No aneurysmal dilatation. No significantly enlarged lymph nodes. Reproductive: Status post hysterectomy. No adnexal masses. Other: Negative for free air or free fluid.  Musculoskeletal: Degenerative changes of the spine. No acute or suspicious abnormality IMPRESSION: 1. Mild to moderate left hydronephrosis and hydroureter, but without ureteral stone identified. 2. Diffuse diverticular disease of the colon without acute inflammation 3. 3.4 cm left adrenal gland adenoma 4. Mildly hyperdense right inferior renal mass, slow enlargement since 2011 Electronically Signed   By: Donavan Foil M.D.   On: 05/22/2017 21:30   US Renal  Result Date: 05/22/2017 CLINICAL DATA:  Acute kidney injury. EXAM: RENAL / URINARY TRACT ULTRASOUND COMPLETE COMPARISON:  03/24/2016 CT abdomen/pelvis. FINDINGS: Right Kidney: Length: 11.4 cm. No right hydronephrosis. Echogenic right renal parenchyma, which is normal in thickness. No right renal mass. Left Kidney: Length: 13.3 cm. No left hydronephrosis. Echogenic left renal parenchyma, which is normal in thickness. No left renal mass. Bladder: Bladder is not visualized on this scan significantly limited by patient body habitus. IMPRESSION: 1. No hydronephrosis. 2. Echogenic kidneys, indicative of nonspecific renal parenchymal disease of uncertain chronicity. 3. Bladder not visualized on this scan, which is significantly limited by patient body habitus. Electronically Signed   By: Ilona Sorrel M.D.   On: 05/22/2017 20:58   Dg Abdomen Acute W/chest  Result Date: 05/22/2017 CLINICAL DATA:  Abdominal pain and distension. Hyperkalemia. History of diabetes. EXAM: DG ABDOMEN ACUTE W/ 1V CHEST COMPARISON:  Chest radiograph January 13, 2016 FINDINGS: Cardiac silhouette is similarly enlarged with pulmonary vascular congestion. Mildly elevated RIGHT hemidiaphragm. No pleural effusion or focal consolidation. No pneumothorax. Soft tissue planes and included osseous structures are nonsuspicious. Bowel gas pattern is nondilated and nonobstructive. No intra-abdominal mass effect. Surgical clips in the included right abdomen compatible with cholecystectomy. No  intraperitoneal free air. Phleboliths in the pelvis. Soft tissue planes included osseous structures are nonsuspicious. Large body habitus. IMPRESSION: 1. Mild cardiomegaly and pulmonary vascular  congestion. No acute pulmonary process. 2. Normal bowel gas pattern. Electronically Signed   By: Elon Alas M.D.   On: 05/22/2017 18:14        Scheduled Meds: . allopurinol  300 mg Oral Daily  . clotrimazole  1 Applicatorful Vaginal QHS  . docusate sodium  100 mg Oral Q12H  . Gerhardt's butt cream   Topical TID  . insulin aspart  0-15 Units Subcutaneous Q4H  . iron polysaccharides  150 mg Oral BID  . mouth rinse  15 mL Mouth Rinse BID  . metoprolol tartrate  5 mg Intravenous Q8H  . pantoprazole  80 mg Oral Q1200  . peg 3350 powder  0.5 kit Oral Once   And  . [START ON 05/25/2017] peg 3350 powder  0.5 kit Oral Once   Continuous Infusions: . sodium chloride Stopped (05/22/17 2225)  . sodium chloride    . cefTRIAXone (ROCEPHIN)  IV Stopped (05/23/17 2135)  . fluconazole (DIFLUCAN) IV    .  sodium bicarbonate (isotonic) infusion in sterile water 100 mL/hr at 05/24/17 0600     LOS: 2 days    Time spent: 40 minutes    Irine Seal, MD [facility] Pager [phone]  If 7PM-7AM, please contact night-coverage [email or web address] Password [email or web address] 05/24/2017, 10:08 AM

## 2017-05-24 NOTE — Progress Notes (Addendum)
[facility] Progress Note  Subjective: creat down to 1.27 K 4.0 and CO2 up to 23. No new /co.   Vitals:   05/24/17 0800 05/24/17 1000 05/24/17 1215 05/24/17 1244  BP: (!) 168/59 (!) 148/45  (!) 168/65  Pulse: 100 100  (!) 108  Resp: (!) 28 (!) 35  (!) 28  Temp: 98.4 F (36.9 C)  98.7 F (37.1 C)   TempSrc: Oral  Oral   SpO2: 95% 97%  91%  Weight:      Height:        Inpatient medications: . allopurinol  300 mg Oral Daily  . clotrimazole  1 Applicatorful Vaginal QHS  . docusate sodium  100 mg Oral Q12H  . Gerhardt's butt cream   Topical TID  . insulin aspart  0-15 Units Subcutaneous Q4H  . iron polysaccharides  150 mg Oral BID  . mouth rinse  15 mL Mouth Rinse BID  . metoprolol tartrate  5 mg Intravenous Q8H  . pantoprazole  80 mg Oral Q1200   . sodium chloride Stopped (05/22/17 2225)  . sodium chloride    . cefTRIAXone (ROCEPHIN)  IV Stopped (05/23/17 2135)  . fluconazole (DIFLUCAN) IV    . magnesium sulfate 1 - 4 g bolus IVPB 4 g (05/24/17 1329)  .  sodium bicarbonate (isotonic) infusion in sterile water 75 mL/hr at 05/24/17 1013   acetaminophen **OR** acetaminophen, lip balm, morphine injection, ondansetron **OR** ondansetron (ZOFRAN) IV  Exam: Lethargic but responds appropriately to questions/ commands Mouth slightly dry No jvd Chest dec'd BS at bases no rales or wheezing RRR no sig MRG Abd obese soft nontender slight distended GU deferred Ext  3+ bilat brawny edema, +3 pedal edema NF, ox 3, gen'd weakness and deconditioning    home meds:  -lasix 40 am and 20 pm/ lisinopril 5 mg qd  -70/30 insulin bid/ macrobid 100 bid/ zocor/ allopurinol 300/ nexium/ metformin 1gm bid  -prn's/ stool softener    UA 09/28/14 > negative UA, neg protein UA 01/13/17 > negative UA, neg protein UA 05/22/17 > turbid, 100 prot, 6-10rbc/ >50 wbc, +epi's/ many bacteria ECHO 2014 > LVEF 60-65%, no wma's CXR 05/22/17 > mild vasc congestion and cardiomegaly, no other  acute disease CT abd 5/14 > mild/ mod L hydronephrosis and hydroureter to the level of the bladder, no ureteral stones notes; R kidney w/o hydro; adrenal gland mass on left up to 3.4 cm slowly growing; mildly hyperdense R inf renal mass slowly enlarged since 2011      Impression: 1 AKI - most likely hypoperfusion in setting poor po intake +ACEi.  Improved w/ creat down 1.7 >> 1.25 which is her baseline. Would avoid ACEi / ARB and NSAID's w/ hyperkalemia problems.  2 CKD w baseline creat 1.2- 1.4. Does not have sig proteinuria so diab nephropathy is unlikely.  3 Hypertension- bp's up some, would avoid ACEi, use other agents as needed.  4 Anemia thal plus something else, needs eval 5 Abdm distension - per primary 6 DM 2 on insulin 7 Obesity 8 Chronic edema suspect venous stasis, CXR clear 9 Bladder ca - sp fulguration bladder mass June 2018 10  L hydronephrosis - urology evaluating 11  Acute UTI - on Rocephin  Plan - will sign off     Kelly Splinter MD [facility] pager [phone]   05/24/2017, 2:42 PM   Recent Labs  Lab 05/22/17 2200 05/23/17 0524 05/23/17 0746 05/23/17 1629 05/24/17 0307  NA 139 139  --   --  142  K 5.7* 5.3* 5.3* 4.7 4.0  CL 113* 109  --   --  105  CO2 16* 19*  --   --  23  GLUCOSE 102* 87  --   --  157*  BUN 82* 72*  --   --  48*  CREATININE 2.11* 1.75*  --   --  1.27*  CALCIUM 9.3 8.6*  --   --  8.2*  PHOS 3.9 4.4  --   --  3.5   Recent Labs  Lab 05/22/17 1704 05/22/17 2200 05/23/17 0524 05/24/17 0307  AST 25  --   --   --   ALT 25  --   --   --   ALKPHOS 158*  --   --   --   BILITOT 0.4  --   --   --   PROT 7.5  --   --   --   ALBUMIN 3.3* 3.1* 2.8* 2.9*   Recent Labs  Lab 05/22/17 1704 05/23/17 0536 05/23/17 1627 05/24/17 0307  WBC 7.6 5.2  --  3.7*  HGB 7.3* 6.8* 9.8* 10.6*  HCT 23.4* 21.5* 30.4* 31.9*  MCV 63.4* 64.4*  --  68.8*  PLT 205 161  --  164   Iron/TIBC/Ferritin/ %Sat    Component Value Date/Time    IRON 20 (L) 05/22/2017 1704   TIBC 276 05/22/2017 1704   FERRITIN 171 06/26/2010 0530   IRONPCTSAT 7 (L) 05/22/2017 1704   IRONPCTSAT 19 (L) 01/21/2008 7829

## 2017-05-24 NOTE — Progress Notes (Signed)
Patient with abdominal distention this morning with abdominal pain and subsequently underwent abdominal films this morning which were concerning for ileus in the colon.  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  Colonic diameter 8.2 cm.  Case discussed with gastroenterology and recommended NG tube as well as rectal tube replaced.  Patient mobilized moved from side-to-side every 2 hours.  Repeat abdominal films in the a.m.  No charge.

## 2017-05-24 NOTE — Progress Notes (Signed)
Pt still refusing NG placement. Husband at bedside unable to convince her. Pt starts yelling and crying when I ask her. Dr. Grandville Silos made aware. Lauren Crosby

## 2017-05-24 NOTE — Progress Notes (Signed)
[facility] Progress Note    Since last GI note: Lower abdominal pains, she says this is chronic and usually occurs in AM after she eats.  No overt bleeding.    Objective: Vital signs in last 24 hours: Temp:  [97.6 F (36.4 C)-98.6 F (37 C)] 98.3 F (36.8 C) (05/16 0352) Pulse Rate:  [82-95] 95 (05/16 0600) Resp:  [15-41] 25 (05/16 0600) BP: (118-184)/(50-68) 184/59 (05/16 0600) SpO2:  [95 %-100 %] 95 % (05/16 0600) Last BM Date: 05/23/17 General: alert and oriented times 3 Heart: regular rate and rythm Abdomen: soft, very mildly tender throughout, non-distended, normal bowel sounds   Lab Results: Recent Labs    05/22/17 1704 05/23/17 0536 05/23/17 1627 05/24/17 0307  WBC 7.6 5.2  --  3.7*  HGB 7.3* 6.8* 9.8* 10.6*  PLT 205 161  --  164  MCV 63.4* 64.4*  --  68.8*   Recent Labs    05/22/17 2200 05/23/17 0524 05/23/17 0746 05/23/17 1629 05/24/17 0307  NA 139 139  --   --  142  K 5.7* 5.3* 5.3* 4.7 4.0  CL 113* 109  --   --  105  CO2 16* 19*  --   --  23  GLUCOSE 102* 87  --   --  157*  BUN 82* 72*  --   --  48*  CREATININE 2.11* 1.75*  --   --  1.27*  CALCIUM 9.3 8.6*  --   --  8.2*   Recent Labs    05/22/17 1704 05/22/17 2200 05/23/17 0524 05/24/17 0307  PROT 7.5  --   --   --   ALBUMIN 3.3* 3.1* 2.8* 2.9*  AST 25  --   --   --   ALT 25  --   --   --   ALKPHOS 158*  --   --   --   BILITOT 0.4  --   --   --     Medications: Scheduled Meds: . allopurinol  300 mg Oral Daily  . docusate sodium  100 mg Oral Q12H  . Gerhardt's butt cream   Topical TID  . insulin aspart  0-15 Units Subcutaneous Q4H  . iron polysaccharides  150 mg Oral BID  . pantoprazole  80 mg Oral Q1200   Continuous Infusions: . sodium chloride Stopped (05/22/17 2225)  . sodium chloride    . cefTRIAXone (ROCEPHIN)  IV Stopped (05/23/17 2135)  . fluconazole (DIFLUCAN) IV    .  sodium bicarbonate (isotonic) infusion in sterile water 100 mL/hr at 05/24/17 0600    PRN Meds:.acetaminophen **OR** acetaminophen, HYDROmorphone (DILAUDID) injection, iohexol, lip balm, ondansetron **OR** ondansetron (ZOFRAN) IV    Assessment/Plan: 84 y.o. female with acute on chronic anemia, lower abdominal pains  Acute on chronic anemia (Hb 6s this admit, baseline 8-9 due to known thalasemia minor).  No overt GI bleeding but has h/o adenomatous colon polyps (last colonoscopy >10 years ago).  FOBT ordered but no stool output since admission.  Will plan on colonsocopy/EGD tomorrow   Milus Banister, MD  05/24/2017, 8:29 AM [facility] Pager [phone]

## 2017-05-25 ENCOUNTER — Encounter (HOSPITAL_COMMUNITY)
Admission: EM | Disposition: A | Discharge status: Home / Self Care | Payer: Self-pay | Source: Home / Self Care | Attending: Internal Medicine

## 2017-05-25 ENCOUNTER — Inpatient Hospital Stay (HOSPITAL_COMMUNITY): Payer: Medicare HMO

## 2017-05-25 DIAGNOSIS — E872 Acidosis, unspecified: Secondary | ICD-10-CM

## 2017-05-25 DIAGNOSIS — N12 Tubulo-interstitial nephritis, not specified as acute or chronic: Secondary | ICD-10-CM

## 2017-05-25 DIAGNOSIS — K56609 Unspecified intestinal obstruction, unspecified as to partial versus complete obstruction: Secondary | ICD-10-CM

## 2017-05-25 LAB — CBC
HCT: 29.6 % — ABNORMAL LOW (ref 36.0–46.0)
Hemoglobin: 9.5 g/dL — ABNORMAL LOW (ref 12.0–15.0)
MCH: 21.8 pg — AB (ref 26.0–34.0)
MCHC: 32.1 g/dL (ref 30.0–36.0)
MCV: 68 fL — ABNORMAL LOW (ref 78.0–100.0)
PLATELETS: 162 10*3/uL (ref 150–400)
RBC: 4.35 MIL/uL (ref 3.87–5.11)
RDW: 22.5 % — ABNORMAL HIGH (ref 11.5–15.5)
WBC: 6.6 10*3/uL (ref 4.0–10.5)

## 2017-05-25 LAB — GLUCOSE, CAPILLARY
GLUCOSE-CAPILLARY: 147 mg/dL — AB (ref 65–99)
GLUCOSE-CAPILLARY: 143 mg/dL — AB (ref 65–99)
GLUCOSE-CAPILLARY: 132 mg/dL — AB (ref 65–99)
Glucose-Capillary: 141 mg/dL — ABNORMAL HIGH (ref 65–99)

## 2017-05-25 LAB — RENAL FUNCTION PANEL
Albumin: 2.6 g/dL — ABNORMAL LOW (ref 3.5–5.0)
Anion gap: 15 (ref 5–15)
BUN: 32 mg/dL — ABNORMAL HIGH (ref 6–20)
CALCIUM: 7.9 mg/dL — AB (ref 8.9–10.3)
CHLORIDE: 101 mmol/L (ref 101–111)
CO2: 24 mmol/L (ref 22–32)
CREATININE: 1.11 mg/dL — AB (ref 0.44–1.00)
GFR calc Af Amer: 52 mL/min — ABNORMAL LOW (ref >60)
GFR calc non Af Amer: 45 mL/min — ABNORMAL LOW (ref >60)
GLUCOSE: 137 mg/dL — AB (ref 65–99)
Phosphorus: 3.4 mg/dL (ref 2.5–4.6)
Potassium: 3.4 mmol/L — ABNORMAL LOW (ref 3.5–5.1)
SODIUM: 140 mmol/L (ref 135–145)

## 2017-05-25 LAB — MAGNESIUM: MAGNESIUM: 2.1 mg/dL (ref 1.7–2.4)

## 2017-05-25 SURGERY — COLONOSCOPY WITH PROPOFOL
Anesthesia: Monitor Anesthesia Care

## 2017-05-25 MED ORDER — POTASSIUM CHLORIDE 10 MEQ/100ML IV SOLN
10.0000 meq | INTRAVENOUS | Status: AC
  Administered 2017-05-25 (×4): 10 meq via INTRAVENOUS
  Filled 2017-05-25 (×4): qty 100

## 2017-05-25 NOTE — Progress Notes (Signed)
[facility] Progress Note    Since last GI note: Imaging yesterday shows very dilated colon without obvious obstruction, no volvulus.  Rectal tube placed.  She declined NG tube; has not been vomiting.  Objective: Vital signs in last 24 hours: Temp:  [98 F (36.7 C)-98.7 F (37.1 C)] 98.6 F (37 C) (05/17 0800) Pulse Rate:  [77-108] 77 (05/17 0815) Resp:  [12-35] 14 (05/17 0815) BP: (106-169)/(28-65) 161/45 (05/17 0815) SpO2:  [89 %-99 %] 97 % (05/17 0815) Last BM Date: 05/23/17 General: alert and oriented times 3 Heart: regular rate and rythm Abdomen: soft, distended, tympanic, +BS, not tense    Lab Results: Recent Labs    05/23/17 0536 05/23/17 1627 05/24/17 0307 05/25/17 0319  WBC 5.2  --  3.7* 6.6  HGB 6.8* 9.8* 10.6* 9.5*  PLT 161  --  164 162  MCV 64.4*  --  68.8* 68.0*   Recent Labs    05/23/17 0524  05/23/17 1629 05/24/17 0307 05/25/17 0319  NA 139  --   --  142 140  K 5.3*   < > 4.7 4.0 3.4*  CL 109  --   --  105 101  CO2 19*  --   --  23 24  GLUCOSE 87  --   --  157* 137*  BUN 72*  --   --  48* 32*  CREATININE 1.75*  --   --  1.27* 1.11*  CALCIUM 8.6*  --   --  8.2* 7.9*   < > = values in this interval not displayed.   Recent Labs    05/22/17 1704  05/23/17 0524 05/24/17 0307 05/25/17 0319  PROT 7.5  --   --   --   --   ALBUMIN 3.3*   < > 2.8* 2.9* 2.6*  AST 25  --   --   --   --   ALT 25  --   --   --   --   ALKPHOS 158*  --   --   --   --   BILITOT 0.4  --   --   --   --    < > = values in this interval not displayed.    Medications: Scheduled Meds: . allopurinol  300 mg Oral Daily  . clotrimazole  1 Applicatorful Vaginal QHS  . docusate sodium  100 mg Oral Q12H  . Gerhardt's butt cream   Topical TID  . insulin aspart  0-15 Units Subcutaneous Q4H  . iron polysaccharides  150 mg Oral BID  . mouth rinse  15 mL Mouth Rinse BID  . metoprolol tartrate  5 mg Intravenous Q8H  . pantoprazole  80 mg Oral Q1200   Continuous  Infusions: . sodium chloride Stopped (05/22/17 2225)  . sodium chloride    . sodium chloride 75 mL/hr at 05/25/17 0806  . cefTRIAXone (ROCEPHIN)  IV Stopped (05/24/17 2300)  . fluconazole (DIFLUCAN) IV Stopped (05/24/17 2138)  . potassium chloride 10 mEq (05/25/17 0911)   PRN Meds:.acetaminophen **OR** acetaminophen, lip balm, morphine injection, ondansetron **OR** ondansetron (ZOFRAN) IV    Assessment/Plan: 84 y.o. female admitted with UTI hydronephrosis, found to have signficant anemia, now with colonic ileus  She is not having any overt GI bleeding.  Plans to evaluate her acute on chronic anemia postponed for now.   I suspect colonic ileus is the result of her UTI (on Abx), significant immobility (cannot walk, has early sacral decub).  Rectal tube is in place.  Need to mobilize her as best that is possible. I discussed with RN and hospitalist that while she is awake she needs to be on left side for 1-2 hours, then right side for 1-2 hours, then bottom upwards (if possible) for 1-2 hours.  Overnight she can remain flat. Follow KUBs serially. Will follow along.   Milus Banister, MD  05/25/2017, 9:20 AM [facility] Pager [phone]

## 2017-05-25 NOTE — Progress Notes (Addendum)
PROGRESS NOTE    Lauren Crosby  EUM:[id number] DOB: 06/02/1931 DOA: 05/22/2017 PCP: [name], MD    Brief Narrative:  Lauren Crosby is a 84 y.o. female with medical history significant of DM2, HTN, nephrolithiasis x7 times (last 6-7 years ago), diverticulitis.  Patient has had progressive abd pain for 1 week with poor PO intake.  Abd pain is diffuse, no V/D.  No fevers nor chills.  No dysuria.  Patient went to PCPs office today, sent in to ED for abnormal labs.   ED Course: Cr 2.35 (up from baseline 1.1), bicarb 15, K 6.8.  [place] was negative for hydronephrosis.  Dr. Jimmy Footman consulted.  CT abd/pelvis actually came back positive for L hydronephrosis.  CT also has a slow growing R infrarenal mass slow enlargement since 2011, also shows a 3.4cm L adrenal adenoma which is known based on PMHx (confident that CT scan is on the correct patient).  Urology and nephrology consulted and following.    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  Patient noted to be hyperkalemic with a acidosis as well on admission which have since improved.  CT abdomen and pelvis consistent with left hydronephrosis with questionable etiology/obstructive uropathy.  Foley catheter has  been placed.  Patient with a urine output of 1.350 L overnight.  Creatinine currently at 1.11 from 1.27 from 1.75 from 2.35 on admission.  Bicarb drip has been discontinued per nephrology.  Will avoid ACE inhibitor/ARB and NSAIDs with recent acute renal failure and hyperkalemia per nephrology recommendations.   2.  Left hydronephrosis Questionable etiology.  Patient assessed by urology who feel could be secondary to pyelonephritis versus obstructive uropathy secondary to diabetic cytopathy versus bladder cancer recurrence.  Foley catheter in place.  Patient with a urine output of 1.350 L overnight.  Creatinine improved and currently at 1.11.  Urinalysis with large leukocytes, negative nitrite, greater than 50 WBCs.  Urine cultures with less than 10,000 colonies however urinalysis significantly consistent with UTI.  Patient also noted to have mild pyelitis on CT abdomen and pelvis on 05/24/2017.  Continue empiric IV Rocephin for 7 to 10 days to complete course of antibiotic treatment.  Foley catheter in place.  Per urology.   3 Colonic ileus Patient complaining of significant abdominal pain morning of 05/24/2017 with increased abdominal distention, tightness.  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  Colonic diameter was 8.2 cm from CT abdomen and pelvis 05/24/2017.  Patient with no emesis.  Patient unable to tolerate NG tube yesterday.  Rectal tube was placed yesterday.  Small amount of stool noted.  Patient still with significant abdominal distention however abdomen is more softer and less tender to palpation.  Keep potassium  greater than 4.  Keep magnesium greater than 2.  Mobilize.  Frequent turning.  Per GI.   4.  UTI/pyelitis Urinalysis consistent with a urinary tract infection.  Urine cultures with less than 10,000 colonies.  Urinalysis however turbid, large leukocytes, greater than 50 WBCs, WBC clumps present, many bacteria.  CT abdomen  and pelvis showing a mild pyelitis.  Patient has also presented with hydronephrosis.  Continue empiric IV Rocephin and treat for total of 7 to 10 days.    5.  Acute on chronic microcytic anemia/iron deficiency anemia/history of thalassemia Hemoglobin was at 7.3 on admission from 9.9 January 12, 2017.  Hemoglobin this morning is 9.5 from 10.6 from 9.8 from 6.8 after transfusion of 3 units of packed red blood cells on 05/23/2017.Marland Kitchen  Patient with no overt bleeding noted.  May need a dose of IV iron.  Went back on a diet will resume oral iron supplementation.  Continue PPI.   Patient with prior history of gastritis per endoscopy of 2005 and history of colonic polyps per colonoscopy of 2008.  Patient seen in consultation by GI and patient was scheduled for EGD and colonoscopy however due to Ogilvie's syndrome/colonic ileus EGD/colonoscopy will be canceled for now and will need to be done in the outpatient setting.  GI following.  Follow H&H transfuse for hemoglobin less than 7.   6.  MASD Continue current wound care.  7.  Hyperkalemia Secondary to problem #1.  Resolved on Kayexalate.  Potassium currently at 3.4.  Give 4 rounds of IV potassium as patient now with colonic ileus.  Follow.  8.  Hypertension ACE inhibitor on hold will likely not resume for at least 8 weeks due to problem #1.  Blood pressure somewhat elevated.  Continue IV Lopressor for now until tolerating oral intake.    9.  Well controlled diabetes mellitus type 2 Hemoglobin A1c was 6.6 on 06/13/2016.  Hemoglobin A1c is 6.7.  CBGs ranging from 141-147.  Patient currently n.p.o.  Continue to hold oral hypoglycemic agents.  Continue sliding scale insulin.   10 acidosis Secondary to problem #1.  Resolved with bicarb drip.  Bicarb drip has been discontinued per nephrology.  Follow.   11 gastroesophageal reflux disease Continue PPI.  12 probable vaginal candidiasis IV Diflucan.  Continue clotrimazole cream.   13 obesity  14 chronic  nonpitting lower extremity edema secondary to chronic venous stasis Stable.  Chest x-ray clear.    DVT prophylaxis: SCDs Code Status: Full Family Communication: Updated patient.  No family at bedside. Disposition Plan: Remain in stepdown unit.   Consultants:   Nephrology: Dr.Deterding 05/22/2017  Urology: Dr.Narang 05/22/2017  Gastroenterology: Dr. Ardis Hughs 05/23/2017  Procedures:   CT abdomen and pelvis 05/22/2017, 05/24/2017  Acute abdominal series 05/22/2017  Renal ultrasound 05/22/2017  1 unit of packed red blood cells 05/22/2017  2 Units packed red blood cells pending 4 05/23/2017  Antimicrobials:   IV Rocephin 05/22/2017   Subjective: Patient alert asking for water.  Denies any change in chronic shortness of breath.  No chest pain.  States abdominal pain has improved since yesterday.  No bowel movement.  No flatus.  No nausea or emesis.    Objective: Vitals:   05/25/17 0400 05/25/17 0600 05/25/17 0800 05/25/17 0815  BP: (!) 147/47 (!) 137/47  (!) 161/45  Pulse: 89 88  77  Resp: (!) 27 (!) 28  14  Temp:   98.6 F (37 C)   TempSrc:   Oral   SpO2: 94% 93%  97%  Weight:      Height:        Intake/Output Summary (Last 24 hours) at 05/25/2017 0934 Last data filed at 05/25/2017 0700 Gross per 24 hour  Intake 2920.42 ml  Output 1350 ml  Net 1570.42 ml   Filed Weights   05/23/17 0129 05/23/17 0556  Weight: 107.6 kg (237 lb 3.4 oz) 107.6 kg (237 lb 3.4 oz)    Examination:  General exam: Alert.  Dry mucous membranes. Respiratory system: Lungs clear to auscultation bilaterally anterior lung fields.  No wheezing, no crackles, no rhonchi.  Respiratory effort normal. Cardiovascular system: Regular rate rhythm no murmurs rubs or gallops.  No JVD.  No lower extremity edema. Gastrointestinal system: Abdomen is softer, less tight, hypoactive bowel sounds.  No rebound.  No guarding.    Central nervous system: Alert and oriented.  No focal neurological  deficits. Extremities: Symmetric 5 x 5 power. Skin: No rashes, lesions or ulcers Psychiatry: Judgement and insight appear normal. Mood & affect appropriate.     Data Reviewed: I have personally reviewed following labs and imaging studies  CBC: Recent Labs  Lab 05/22/17 1704 05/23/17 0536 05/23/17 1627 05/24/17 0307 05/25/17 0319  WBC 7.6 5.2  --  3.7* 6.6  HGB 7.3* 6.8* 9.8* 10.6* 9.5*  HCT 23.4* 21.5* 30.4* 31.9* 29.6*  MCV 63.4* 64.4*  --  68.8* 68.0*  PLT 205 161  --  164 161   Basic Metabolic Panel: Recent Labs  Lab 05/22/17 1704 05/22/17 2200 05/23/17 0524 05/23/17 0746 05/23/17 1629 05/24/17 0307 05/25/17 0319  NA 137 139 139  --   --  142 140  K 6.8* 5.7* 5.3* 5.3* 4.7 4.0 3.4*  CL 112* 113* 109  --   --  105 101  CO2 15* 16* 19*  --   --  23 24  GLUCOSE 148* 102* 87  --   --  157* 137*  BUN 83* 82* 72*  --   --  48* 32*  CREATININE 2.35* 2.11* 1.75*  --   --  1.27* 1.11*  CALCIUM 9.3 9.3 8.6*  --   --  8.2* 7.9*  MG  --   --   --   --   --  1.4* 2.1  PHOS  --  3.9 4.4  --   --  3.5 3.4   GFR: Estimated Creatinine Clearance: 45.2 mL/min (A) (by C-G formula based on SCr of 1.11 mg/dL (H)). Liver Function Tests: Recent Labs  Lab 05/22/17 1704 05/22/17 2200 05/23/17 0524 05/24/17 0307 05/25/17 0319  AST 25  --   --   --   --   ALT 25  --   --   --   --   ALKPHOS 158*  --   --   --   --   BILITOT 0.4  --   --   --   --   PROT 7.5  --   --   --   --   ALBUMIN 3.3* 3.1* 2.8* 2.9* 2.6*   Recent Labs  Lab 05/22/17 1704 05/23/17 0746  LIPASE 76* 45  AMYLASE 184*  --    No results for input(s): AMMONIA in the last 168 hours. Coagulation Profile: No results for input(s): INR, PROTIME in the last 168 hours. Cardiac Enzymes: No results for input(s): CKTOTAL, CKMB, CKMBINDEX, TROPONINI in the last 168 hours. BNP (last 3 results) No results for input(s): PROBNP in the last 8760 hours. HbA1C: Recent Labs  05/24/17 0307  HGBA1C 6.7*    CBG: Recent Labs  Lab 05/24/17 1557 05/24/17 1914 05/24/17 2309 05/25/17 0329 05/25/17 0739  GLUCAP 171* 182* 147* 141* 147*   Lipid Profile: No results for input(s): CHOL, HDL, LDLCALC, TRIG, CHOLHDL, LDLDIRECT in the last 72 hours. Thyroid Function Tests: No results for input(s): TSH, T4TOTAL, FREET4, T3FREE, THYROIDAB in the last 72 hours. Anemia Panel: Recent Labs    05/22/17 1704  TIBC 276  IRON 20*   Sepsis Labs: Recent Labs  Lab 05/23/17 0153 05/23/17 0525  LATICACIDVEN 0.8 0.6    Recent Results (from the past 240 hour(s))  MRSA PCR Screening     Status: None   Collection Time: 05/23/17  2:17 AM  Result Value Ref Range Status   MRSA by PCR NEGATIVE NEGATIVE Final    Comment:        The GeneXpert MRSA Assay (FDA approved for NASAL specimens only), is one component of a comprehensive MRSA colonization surveillance program. It is not intended to diagnose MRSA infection nor to guide or monitor treatment for MRSA infections. Performed at [facility], [place] [address] [place], [place] [address]   Culture, Urine     Status: Abnormal   Collection Time: 05/23/17  8:50 AM  Result Value Ref Range Status   Specimen Description   Final    URINE, CLEAN CATCH Performed at [facility], [place] [address] [place], [place] [address]    Special Requests   Final    NONE Performed at [facility], [place] [address], [place], [place] [address]    Culture (A)  Final    <10,000 COLONIES/mL INSIGNIFICANT GROWTH Performed at [facility] [address] [place],  [address]    Report Status 05/24/2017 FINAL  Final         Radiology Studies: Dg Chest 2 View  Result Date: 05/24/2017 CLINICAL DATA:  Shortness of breath. EXAM: CHEST - 2 VIEW COMPARISON:  Radiographs of May 22, 2017. FINDINGS: Stable cardiomegaly. No pneumothorax or pleural effusion is noted. No acute pulmonary disease is noted. Bony  thorax is unremarkable. IMPRESSION: No active cardiopulmonary disease. Electronically Signed   By: Marijo Conception, M.D.   On: 05/24/2017 11:32   Ct Abdomen Pelvis W Contrast  Result Date: 05/24/2017 CLINICAL DATA:  Diffuse abdominal pain for week. Abdominal distension. History of nephrolithiasis, diverticulitis, ventral hernia repair, cholecystectomy, appendectomy and hysterectomy. EXAM: CT ABDOMEN AND PELVIS WITH CONTRAST TECHNIQUE: Multidetector CT imaging of the abdomen and pelvis was performed using the standard protocol following bolus administration of intravenous contrast. CONTRAST:  26mL ISOVUE-300 IOPAMIDOL (ISOVUE-300) INJECTION 61%, 12mL OMNIPAQUE IOHEXOL 300 MG/ML SOLN COMPARISON:  Abdominal radiograph May 24, 2017 and CT abdomen and pelvis May 22, 2017 and CT abdomen and pelvis February 10, 2015 and CT abdomen and pelvis May 03, 2009. FINDINGS: LOWER CHEST: Mild bibasilar bronchiectasis. 4 mm lingular pulmonary nodule, not definitely included on prior CTs though partially imaged in 2018. No routine indicated follow-up. Heart size is normal. Severe coronary artery calcifications. HEPATOBILIARY: Slightly nodular liver contour. Status post cholecystectomy. PANCREAS: Normal. SPLEEN: Normal. ADRENALS/URINARY TRACT: Kidneys are orthotopic, demonstrating symmetric enhancement. No nephrolithiasis or hydronephrosis. Exophytic nonenhancing 3.8 cm RIGHT lower pole renal mass was 3.4 cm in 2011, likely benign. Too small to characterize hypodensities RIGHT kidney. The unopacified ureters are normal in course and caliber, mild urothelial enhancement. Delayed imaging through the kidneys demonstrates symmetric prompt contrast excretion within the proximal urinary  collecting system. Urinary bladder is decompressed by Foley catheter. Stable 3 mm LEFT adrenal adenoma from 2011. STOMACH/BOWEL: Colonic air contrast levels with small amount of retained large bowel, large bowel measures to 8.2 cm. Relatively  decompressed small bowel. VASCULAR/LYMPHATIC: Aortoiliac vessels are normal in course and caliber. Moderate calcific atherosclerosis. No lymphadenopathy by CT size criteria. REPRODUCTIVE: Status post hysterectomy. OTHER: Protuberant abdomen. Small volume free fluid RIGHT upper quadrant. No drainable fluid collection. No intraperitoneal free air. MUSCULOSKELETAL: Nonacute. Osteopenia. Advanced lower lumbar facet arthropathy. IMPRESSION: 1. 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 No bowel obstruction. Colonic diverticulosis. 2. Mildly nodular liver compatible with cirrhosis with new small volume ascites. 3. Mild suspected pyelitis, recommend correlation with urinary analysis. Aortic Atherosclerosis (ICD10-I70.0). Electronically Signed   By: Elon Alas M.D.   On: 05/24/2017 15:23   Dg Abd 2 Views  Result Date: 05/24/2017 CLINICAL DATA:  Generalized abdominal pain and distention. EXAM: ABDOMEN - 2 VIEW COMPARISON:  Radiographs May 22, 2017. FINDINGS: No free air is noted to suggest pneumoperitoneum. Air-filled colonic distention is now noted without small bowel dilatation. Status post cholecystectomy. No abnormal calcifications are noted. IMPRESSION: Interval development air-filled colonic distention is noted which may represent ileus. No small bowel dilatation is noted. Electronically Signed   By: Marijo Conception, M.D.   On: 05/24/2017 11:30        Scheduled Meds: . allopurinol  300 mg Oral Daily  . clotrimazole  1 Applicatorful Vaginal QHS  . docusate sodium  100 mg Oral Q12H  . Gerhardt's butt cream   Topical TID  . insulin aspart  0-15 Units Subcutaneous Q4H  . iron polysaccharides  150 mg Oral BID  . mouth rinse  15 mL Mouth Rinse BID  . metoprolol tartrate  5 mg Intravenous Q8H  . pantoprazole  80 mg Oral Q1200   Continuous Infusions: . sodium chloride Stopped (05/22/17 2225)  . sodium chloride    . sodium chloride 75 mL/hr at 05/25/17 0806  . cefTRIAXone (ROCEPHIN)  IV  Stopped (05/24/17 2300)  . fluconazole (DIFLUCAN) IV Stopped (05/24/17 2138)  . potassium chloride 10 mEq (05/25/17 0911)     LOS: 3 days    Time spent: 40 minutes    Irine Seal, MD [facility] Pager [phone]  If 7PM-7AM, please contact night-coverage [email or web address] Password [facility] 05/25/2017, 9:34 AM

## 2017-05-25 NOTE — Plan of Care (Signed)
?  Problem: Elimination: ?Goal: Will not experience complications related to bowel motility ?Outcome: Progressing ?  ?Problem: Pain Managment: ?Goal: General experience of comfort will improve ?Outcome: Progressing ?  ?Problem: Safety: ?Goal: Ability to remain free from injury will improve ?Outcome: Progressing ?  ?

## 2017-05-26 ENCOUNTER — Inpatient Hospital Stay (HOSPITAL_COMMUNITY): Payer: Medicare HMO

## 2017-05-26 LAB — GLUCOSE, CAPILLARY
GLUCOSE-CAPILLARY: 125 mg/dL — AB (ref 65–99)
GLUCOSE-CAPILLARY: 155 mg/dL — AB (ref 65–99)
Glucose-Capillary: 204 mg/dL — ABNORMAL HIGH (ref 65–99)
Glucose-Capillary: 295 mg/dL — ABNORMAL HIGH (ref 65–99)
Glucose-Capillary: 204 mg/dL — ABNORMAL HIGH (ref 65–99)

## 2017-05-26 LAB — RENAL FUNCTION PANEL
ALBUMIN: 2.4 g/dL — AB (ref 3.5–5.0)
ANION GAP: 16 — AB (ref 5–15)
BUN: 20 mg/dL (ref 6–20)
CALCIUM: 8.1 mg/dL — AB (ref 8.9–10.3)
CO2: 23 mmol/L (ref 22–32)
Chloride: 104 mmol/L (ref 101–111)
Creatinine, Ser: 1.06 mg/dL — ABNORMAL HIGH (ref 0.44–1.00)
GFR calc Af Amer: 55 mL/min — ABNORMAL LOW (ref >60)
GFR calc non Af Amer: 47 mL/min — ABNORMAL LOW (ref >60)
Glucose, Bld: 123 mg/dL — ABNORMAL HIGH (ref 65–99)
PHOSPHORUS: 2.7 mg/dL (ref 2.5–4.6)
Potassium: 3.7 mmol/L (ref 3.5–5.1)
SODIUM: 143 mmol/L (ref 135–145)

## 2017-05-26 LAB — CBC
HCT: 30.5 % — ABNORMAL LOW (ref 36.0–46.0)
HEMOGLOBIN: 9.5 g/dL — AB (ref 12.0–15.0)
MCH: 22 pg — ABNORMAL LOW (ref 26.0–34.0)
MCHC: 31.1 g/dL (ref 30.0–36.0)
MCV: 70.6 fL — ABNORMAL LOW (ref 78.0–100.0)
Platelets: 182 10*3/uL (ref 150–400)
RBC: 4.32 MIL/uL (ref 3.87–5.11)
RDW: 22.3 % — ABNORMAL HIGH (ref 11.5–15.5)
WBC: 7.1 10*3/uL (ref 4.0–10.5)

## 2017-05-26 LAB — MAGNESIUM: MAGNESIUM: 1.8 mg/dL (ref 1.7–2.4)

## 2017-05-26 MED ORDER — POTASSIUM CHLORIDE 10 MEQ/100ML IV SOLN
10.0000 meq | INTRAVENOUS | Status: AC
  Administered 2017-05-26 (×4): 10 meq via INTRAVENOUS
  Filled 2017-05-26 (×4): qty 100

## 2017-05-26 MED ORDER — SODIUM CHLORIDE 0.9 % IV SOLN
510.0000 mg | Freq: Once | INTRAVENOUS | Status: AC
  Administered 2017-05-26: 510 mg via INTRAVENOUS
  Filled 2017-05-26: qty 17

## 2017-05-26 MED ORDER — MAGNESIUM SULFATE 4 GM/100ML IV SOLN
4.0000 g | Freq: Once | INTRAVENOUS | Status: AC
  Administered 2017-05-26: 4 g via INTRAVENOUS
  Filled 2017-05-26: qty 100

## 2017-05-26 MED ORDER — METOPROLOL TARTRATE 5 MG/5ML IV SOLN
7.5000 mg | Freq: Three times a day (TID) | INTRAVENOUS | Status: DC
  Administered 2017-05-26: 7.5 mg via INTRAVENOUS
  Filled 2017-05-26: qty 10

## 2017-05-26 MED ORDER — METOPROLOL TARTRATE 5 MG/5ML IV SOLN
10.0000 mg | Freq: Three times a day (TID) | INTRAVENOUS | Status: DC
  Administered 2017-05-26 – 2017-05-29 (×9): 10 mg via INTRAVENOUS
  Filled 2017-05-26 (×9): qty 10

## 2017-05-26 MED ORDER — PANTOPRAZOLE SODIUM 40 MG PO TBEC
40.0000 mg | DELAYED_RELEASE_TABLET | Freq: Every day | ORAL | Status: DC
  Administered 2017-05-26 – 2017-06-01 (×7): 40 mg via ORAL
  Filled 2017-05-26 (×6): qty 1

## 2017-05-26 MED ORDER — FUROSEMIDE 10 MG/ML IJ SOLN
20.0000 mg | Freq: Every day | INTRAMUSCULAR | Status: DC
  Administered 2017-05-26: 20 mg via INTRAVENOUS
  Filled 2017-05-26: qty 2

## 2017-05-26 NOTE — Evaluation (Signed)
Physical Therapy Evaluation Patient Details Name: Lauren Crosby MRN: [id number] DOB: 01/22/1931 Today's Date: 05/26/2017   History of Present Illness  Pt admitted through ED with dx of ARF, hyperkalemia, and anemia.  Pt with history of DM with retinopathy, nephropathy and Neuropathy.   Pt also with chronic LE edema and bilat knee and back pain limiting mobility at home.  Clinical Impression  Pt admitted as above and presenting with functional mobility limitations 2* generalized weakness, back and knee pain, obesity and SOB with minimal exertion.  Pt would benefit from follow up rehab at SNF level to maximize IND and safety prior to return home.    Follow Up Recommendations SNF    Equipment Recommendations  None recommended by PT    Recommendations for Other Services       Precautions / Restrictions Precautions Precautions: Fall Restrictions Weight Bearing Restrictions: No      Mobility  Bed Mobility Overal bed mobility: Needs Assistance Bed Mobility: Supine to Sit;Sit to Supine     Supine to sit: Mod assist;+2 for physical assistance;+2 for safety/equipment Sit to supine: Mod assist;+2 for physical assistance;+2 for safety/equipment   General bed mobility comments: Pt initiating movement sit<>supine but requiring physical assist to manage bilat LEs and bring trunk to upright as well as to complete transition to EOB sitting  Transfers Overall transfer level: Needs assistance Equipment used: Rolling walker (2 wheeled) Transfers: Sit to/from Stand Sit to Stand: Min assist;+2 physical assistance;+2 safety/equipment;From elevated surface         General transfer comment: assist to bring wt up and fwd and to balance in initial standing with RW  Ambulation/Gait Ambulation/Gait assistance: Min assist;+2 physical assistance;+2 safety/equipment Ambulation Distance (Feet): 1 Feet Assistive device: Rolling walker (2 wheeled) Gait Pattern/deviations: Step-to  pattern;Decreased step length - right;Decreased step length - left;Shuffle;Trunk flexed     General Gait Details: pt managed several short shuffling steps at bedside but unable to continue 2* c/o bilt knee and back pain.  Stairs            Wheelchair Mobility    Modified Rankin (Stroke Patients Only)       Balance Overall balance assessment: Needs assistance Sitting-balance support: No upper extremity supported;Feet supported Sitting balance-Leahy Scale: Good     Standing balance support: Bilateral upper extremity supported Standing balance-Leahy Scale: Poor                               Pertinent Vitals/Pain Pain Assessment: Faces Faces Pain Scale: Hurts even more Pain Location: L arm, bilat knees and back Pain Descriptors / Indicators: Grimacing;Moaning;Sore;Aching Pain Intervention(s): Limited activity within patient's tolerance;Monitored during session    Home Living Family/patient expects to be discharged to:: Private residence Living Arrangements: Spouse/significant other Available Help at Discharge: Family Type of Home: House Home Access: Level entry     Home Layout: One level Home Equipment: Environmental consultant - 2 wheels;Wheelchair - Insurance claims handler - 4 wheels      Prior Function Level of Independence: Needs assistance   Gait / Transfers Assistance Needed: Primarily uses WC, very short distance ambulation 2* back and knee pain with standing  ADL's / Homemaking Assistance Needed: spouse assists with all ADL's        Hand Dominance        Extremity/Trunk Assessment   Upper Extremity Assessment Upper Extremity Assessment: Defer to OT evaluation    Lower Extremity Assessment Lower Extremity Assessment: Generalized weakness  Cervical / Trunk Assessment Cervical / Trunk Assessment: Kyphotic  Communication   Communication: HOH  Cognition Arousal/Alertness: Awake/alert Behavior During Therapy: WFL for tasks  assessed/performed Overall Cognitive Status: Within Functional Limits for tasks assessed                                        General Comments      Exercises     Assessment/Plan    PT Assessment Patient needs continued PT services  PT Problem List Decreased strength;Decreased range of motion;Decreased activity tolerance;Decreased balance;Decreased mobility;Decreased knowledge of use of DME;Pain;Obesity       PT Treatment Interventions DME instruction;Gait training;Functional mobility training;Therapeutic activities;Therapeutic exercise;Balance training;Patient/family education    PT Goals (Current goals can be found in the Care Plan section)  Acute Rehab PT Goals Patient Stated Goal: HOME PT Goal Formulation: With patient Time For Goal Achievement: 06/09/17 Potential to Achieve Goals: Fair    Frequency Min 3X/week   Barriers to discharge Decreased caregiver support Home with elderly spouse    Co-evaluation PT/OT/SLP Co-Evaluation/Treatment: Yes Reason for Co-Treatment: For patient/therapist safety PT goals addressed during session: Mobility/safety with mobility OT goals addressed during session: ADL's and self-care       AM-PAC PT "6 Clicks" Daily Activity  Outcome Measure Difficulty turning over in bed (including adjusting bedclothes, sheets and blankets)?: Unable Difficulty moving from lying on back to sitting on the side of the bed? : Unable Difficulty sitting down on and standing up from a chair with arms (e.g., wheelchair, bedside commode, etc,.)?: Unable Help needed moving to and from a bed to chair (including a wheelchair)?: A Lot Help needed walking in hospital room?: Total Help needed climbing 3-5 steps with a railing? : Total 6 Click Score: 7    End of Session Equipment Utilized During Treatment: Gait belt Activity Tolerance: Patient limited by fatigue;Patient limited by pain Patient left: in bed;with call bell/phone within  reach Nurse Communication: Mobility status PT Visit Diagnosis: Difficulty in walking, not elsewhere classified (R26.2)    Time: 1010-1043 PT Time Calculation (min) (ACUTE ONLY): 33 min   Charges:   PT Evaluation $PT Eval Moderate Complexity: 1 Mod     PT G Codes:        Pg [phone]   Berkley Wrightsman 05/26/2017, 12:57 PM

## 2017-05-26 NOTE — Progress Notes (Addendum)
[facility] Progress Note    Since last GI note: Great job mobilizing her yesterday and she has been passing a lot of flatus. KUB looks improved to me, still a bit of dilated colon but most of the air is gone; no official reading yet.  She want to try liquid diet.  Hoping to go home soon.  Objective: Vital signs in last 24 hours: Temp:  [98.5 F (36.9 C)-98.9 F (37.2 C)] 98.6 F (37 C) (05/18 0800) Pulse Rate:  [72-82] 79 (05/18 0600) Resp:  [9-30] 15 (05/18 0600) BP: (123-177)/(32-94) 168/49 (05/18 0600) SpO2:  [93 %-100 %] 97 % (05/18 0600) Weight:  [241 lb 2.9 oz (109.4 kg)] 241 lb 2.9 oz (109.4 kg) (05/18 0200) Last BM Date: 05/25/17 General: alert and oriented times 3 Heart: regular rate and rythm Abdomen: soft, non-tender, much less distended,  normal bowel sounds   Lab Results: Recent Labs    05/24/17 0307 05/25/17 0319 05/26/17 0312  WBC 3.7* 6.6 7.1  HGB 10.6* 9.5* 9.5*  PLT 164 162 182  MCV 68.8* 68.0* 70.6*   Recent Labs    05/24/17 0307 05/25/17 0319 05/26/17 0312  NA 142 140 143  K 4.0 3.4* 3.7  CL 105 101 104  CO2 23 24 23   GLUCOSE 157* 137* 123*  BUN 48* 32* 20  CREATININE 1.27* 1.11* 1.06*  CALCIUM 8.2* 7.9* 8.1*   Recent Labs    05/24/17 0307 05/25/17 0319 05/26/17 0312  ALBUMIN 2.9* 2.6* 2.4*    Medications: Scheduled Meds: . allopurinol  300 mg Oral Daily  . clotrimazole  1 Applicatorful Vaginal QHS  . docusate sodium  100 mg Oral Q12H  . Gerhardt's butt cream   Topical TID  . insulin aspart  0-15 Units Subcutaneous Q4H  . iron polysaccharides  150 mg Oral BID  . mouth rinse  15 mL Mouth Rinse BID  . metoprolol tartrate  5 mg Intravenous Q8H  . pantoprazole  80 mg Oral Q1200   Continuous Infusions: . sodium chloride 75 mL/hr at 05/26/17 0644  . cefTRIAXone (ROCEPHIN)  IV Stopped (05/25/17 2315)  . fluconazole (DIFLUCAN) IV Stopped (05/25/17 2225)  . magnesium sulfate 1 - 4 g bolus IVPB     PRN  Meds:.acetaminophen **OR** acetaminophen, lip balm, morphine injection, ondansetron **OR** ondansetron (ZOFRAN) IV   Assessment/Plan: 84 y.o. female with colonic ileus following acute illness with urinary tract infection, hydronephrosis, also found to be much more anemic at admission.  Kudos to the staff, mobilizing her is helping. She's passed a lot of gas in past 24 hours. Need to continue these measures: 2hours on left, 2hours on right, backside up if possible, OOB to chair at least once today. Will retry clear liquids today.  Will d/c rectal tube.  I also changed her oral  PPI to 40 daily rather than 80mg  daily.  Continue antibiotics for the UTI that seem to have been the initial insult here.  Not sure that she'll need further GI testing. Has not had overt bleeding and she is proving to be pretty frail, I don't know that she could tolerate a colonoscopy/EGD.  Will follow along.   Milus Banister, MD  05/26/2017, 8:47 AM [facility] Pager [phone]

## 2017-05-26 NOTE — Evaluation (Addendum)
Occupational Therapy Evaluation Patient Details Name: Lauren Crosby MRN: [id number] DOB: January 04, 1932 Today's Date: 05/26/2017    History of Present Illness Pt admitted through ED with dx of ARF, hyperkalemia, and anemia.  Pt with history of DM with retinopathy, nephropathy and Neuropathy.   Pt also with chronic LE edema and bilat knee and back pain limiting mobility at home.   Clinical Impression   Pt limited by feeling lightheaded during EOB sitting as well as complaints of back, bilateral knee and L shoulder pain with activity. Pt will benefit from continued OT to progress ADL independence for next venue. Feel pt will benefit from SNF at d/c to increase strength and independence with ADL. Will follow.    Follow Up Recommendations  SNF;Supervision/Assistance - 24 hour    Equipment Recommendations  (defer to next venue but will need a 3in1 if home)    Recommendations for Other Services       Precautions / Restrictions Precautions Precautions: Fall Restrictions Weight Bearing Restrictions: No      Mobility Bed Mobility Overal bed mobility: Needs Assistance Bed Mobility: Supine to Sit;Sit to Supine     Supine to sit: Mod assist;+2 for physical assistance;+2 for safety/equipment Sit to supine: Mod assist;+2 for physical assistance;+2 for safety/equipment   General bed mobility comments: Pt initiating movement sit<>supine but requiring physical assist to manage bilat LEs and bring trunk to upright as well as to complete transition to EOB sitting  Transfers Overall transfer level: Needs assistance Equipment used: Rolling walker (2 wheeled) Transfers: Sit to/from Stand Sit to Stand: Min assist;+2 physical assistance;+2 safety/equipment;From elevated surface         General transfer comment: assist to bring wt up and fwd and to balance in initial standing with RW    Balance Overall balance assessment: Needs assistance Sitting-balance support: No upper extremity  supported;Feet supported Sitting balance-Leahy Scale: Good     Standing balance support: Bilateral upper extremity supported Standing balance-Leahy Scale: Poor                             ADL either performed or assessed with clinical judgement   ADL Overall ADL's : Needs assistance/impaired Eating/Feeding: Set up;Bed level   Grooming: Wash/dry face;Min guard;Sitting   Upper Body Bathing: Sitting;Moderate assistance   Lower Body Bathing: +2 for physical assistance;+2 for safety/equipment;Total assistance;Sit to/from stand   Upper Body Dressing : Moderate assistance;Sitting   Lower Body Dressing: +2 for physical assistance;+2 for safety/equipment;Total assistance;Sit to/from stand   Toilet Transfer: +2 for physical assistance;+2 for safety/equipment;RW;Minimal assistance Toilet Transfer Details (indicate cue type and reason): sit to stand X 2 for periarea hygiene today. Toileting- Clothing Manipulation and Hygiene: +2 for physical assistance;+2 for safety/equipment;Total assistance;Sit to/from stand         General ADL Comments: Pt sat EOB and reported feeling lightheaded. It continued to be present and BP taken which was 195/85. Nursing informed. Pt feeling some better with increased time at EOB and encouragement to perform purse lip breathing as pt does fatigue easily and become SOB. Pt did wash face sitting EOB also. Pt with very limited activity tolerance due to pain in her L shoulder, back and knees and due to feeling lightheaded.      Vision Patient Visual Report: No change from baseline       Perception     Praxis      Pertinent Vitals/Pain Pain Assessment: Faces Faces Pain Scale: Hurts even  more Pain Location: L arm, bilat knees and back Pain Descriptors / Indicators: Grimacing;Moaning;Sore;Aching Pain Intervention(s): Limited activity within patient's tolerance;Monitored during session     Hand Dominance Right   Extremity/Trunk Assessment Upper  Extremity Assessment Upper Extremity Assessment: Generalized weakness      Cervical / Trunk Assessment Cervical / Trunk Assessment: Kyphotic   Communication Communication Communication: HOH   Cognition Arousal/Alertness: Awake/alert Behavior During Therapy: WFL for tasks assessed/performed Overall Cognitive Status: Within Functional Limits for tasks assessed                                     General Comments       Exercises     Shoulder Instructions      Home Living Family/patient expects to be discharged to:: Private residence Living Arrangements: Spouse/significant other Available Help at Discharge: Family Type of Home: House Home Access: Level entry     Home Layout: One level     Bathroom Shower/Tub: Occupational psychologist: Standard     Home Equipment: Environmental consultant - 2 wheels;Wheelchair - Insurance claims handler - 4 wheels          Prior Functioning/Environment Level of Independence: Needs assistance  Gait / Transfers Assistance Needed: Primarily uses WC, very short distance ambulation 2* back and knee pain with standing ADL's / Homemaking Assistance Needed: spouse assists with all ADL's            OT Problem List: Decreased strength;Decreased knowledge of use of DME or AE;Decreased activity tolerance      OT Treatment/Interventions: Self-care/ADL training;DME and/or AE instruction;Therapeutic activities;Patient/family education    OT Goals(Current goals can be found in the care plan section) Acute Rehab OT Goals Patient Stated Goal: wants to go home. OT Goal Formulation: With patient Time For Goal Achievement: 06/09/17 Potential to Achieve Goals: Good  OT Frequency: Min 2X/week   Barriers to D/C:            Co-evaluation PT/OT/SLP Co-Evaluation/Treatment: Yes Reason for Co-Treatment: For patient/therapist safety PT goals addressed during session: Mobility/safety with mobility OT goals addressed during session:  ADL's and self-care;Proper use of Adaptive equipment and DME      AM-PAC PT "6 Clicks" Daily Activity     Outcome Measure Help from another person eating meals?: A Little Help from another person taking care of personal grooming?: A Little Help from another person toileting, which includes using toliet, bedpan, or urinal?: A Lot Help from another person bathing (including washing, rinsing, drying)?: A Lot Help from another person to put on and taking off regular upper body clothing?: A Lot Help from another person to put on and taking off regular lower body clothing?: A Lot 6 Click Score: 14   End of Session Equipment Utilized During Treatment: Rolling walker Nurse Communication: Mobility status  Activity Tolerance: Patient limited by fatigue;Patient limited by pain Patient left: in bed;with call bell/phone within reach  OT Visit Diagnosis: Unsteadiness on feet (R26.81);Muscle weakness (generalized) (M62.81)                Time: 1007-1040 OT Time Calculation (min): 33 min Charges:  OT General Charges $OT Visit: 1 Visit OT Evaluation $OT Eval Moderate Complexity: 1 Mod G-Codes:       Jae Dire Latisha Lasch 05/26/2017, 1:43 PM

## 2017-05-26 NOTE — Progress Notes (Signed)
PROGRESS NOTE    Lauren Crosby  HCW:[id number] DOB: 1931-04-16 DOA: 05/22/2017 PCP: [name], MD    Brief Narrative:  Lauren Crosby is a 84 y.o. female with medical history significant of DM2, HTN, nephrolithiasis x7 times (last 6-7 years ago), diverticulitis.  Patient has had progressive abd pain for 1 week with poor PO intake.  Abd pain is diffuse, no V/D.  No fevers nor chills.  No dysuria.  Patient went to PCPs office today, sent in to ED for abnormal labs.   ED Course: Cr 2.35 (up from baseline 1.1), bicarb 15, K 6.8.  [place] was negative for hydronephrosis.  Dr. Jimmy Footman consulted.  CT abd/pelvis actually came back positive for L hydronephrosis.  CT also has a slow growing R infrarenal mass slow enlargement since 2011, also shows a 3.4cm L adrenal adenoma which is known based on PMHx (confident that CT scan is on the correct patient).  Urology and nephrology consulted and following.    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  Patient noted to be hyperkalemic with a acidosis as well on admission which have since improved.  CT abdomen and pelvis consistent with left hydronephrosis with questionable etiology/obstructive uropathy.  Foley catheter has  been placed.  Patient with a urine output of 1.9 L overnight.  Creatinine currently at 1.06 from 1.11 from 1.27 from 1.75 from 2.35 on admission.  Bicarb drip has been discontinued per nephrology.  Will avoid ACE inhibitor/ARB and NSAIDs with recent acute renal failure and hyperkalemia per nephrology recommendations.   2.  Left hydronephrosis Questionable etiology.  Patient assessed by urology who feel could be secondary to pyelonephritis versus obstructive uropathy secondary to diabetic cytopathy versus bladder cancer recurrence.  Foley catheter in place.  Patient with a urine output of 1.9 L overnight.  Creatinine improved and currently at 1.06.  Urinalysis with large leukocytes, negative nitrite, greater than 50 WBCs.  Urine cultures with less than 10,000 colonies however urinalysis significantly consistent with UTI.  Patient also noted to have mild pyelitis on CT abdomen and pelvis on 05/24/2017.  Continue empiric IV Rocephin for 7 to 10 days to complete course of antibiotic treatment.  Foley catheter in place.  Likely be discharged with Foley catheter with outpatient follow-up with urology.    3 Colonic ileus Patient complaining of significant abdominal pain morning of 05/24/2017 with increased abdominal distention, tightness.  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  Colonic diameter was 8.2 cm from CT abdomen and pelvis 05/24/2017.  Patient with no emesis.  Patient unable to tolerate NG tube on 05/24/2017.  Rectal tube was placed subsequently been discontinued by gastroenterology.  Repeat abdominal films this morning improved.  Patient  improved clinically abdomen is softer, less tender, less distended.  Keep potassium greater than 4.  Keep magnesium greater than 2.  Continue frequent turning 2 hours on the left, 2 hours on the right, out of bed to chair and mobilize as tolerated.  Frequent turning.  Per GI.  Patient started on clears.  4.   UTI/pyelitis Urinalysis consistent with a urinary tract infection.  Per GI likely etiology of colonic ileus and abdominal pain.  Urine cultures with less than 10,000 colonies.  Urinalysis however turbid, large leukocytes, greater than 50 WBCs, WBC clumps present, many bacteria.  CT abdomen and pelvis showing a mild pyelitis.  Patient had also presented with hydronephrosis.  Continue empiric IV Rocephin and treat for total of 7 to 10 days.  Likely transition to oral antibiotics on discharge.  5.  Acute on chronic microcytic anemia/iron deficiency anemia/history of thalassemia Hemoglobin was at 7.3 on admission from 9.9 January 12, 2017.  Hemoglobin this morning is stable at 9.5 from 9.5 from 10.6 from 9.8 from 6.8 after transfusion of 3 units of packed red blood cells on 05/23/2017.Marland Kitchen  Patient with no overt bleeding noted.  May need a dose of IV iron.  Went back on a diet will resume oral iron supplementation.  Continue PPI.   Patient with prior history of gastritis per endoscopy of 2005 and history of colonic polyps per colonoscopy of 2008.  Patient seen in consultation by GI and patient was scheduled for EGD and colonoscopy however due to Ogilvie's syndrome/colonic ileus EGD/colonoscopy will be canceled for now and will need to be done in the outpatient setting.  GI following.  Follow H&H transfuse for hemoglobin less than 7.   6.  MASD Continue current wound care.  7.  Hyperkalemia Secondary to problem #1.  Resolved on Kayexalate.  Potassium currently at 3.7.  Give 4 runs of IV potassium as patient now with colonic ileus.  Follow.  8.  Hypertension ACE inhibitor on hold will likely not resume for at least 8 weeks due to problem #1.  Blood pressure elevated overnight and this morning in the 170s to 180s.  Increase IV Lopressor to 7.5 mg IV every 8 hours.  Patient has been started on clears.  Will saline lock IV fluids.  Patient was on oral Lasix prior to admission which were held on admission secondary to  problem #1.  Will place on Lasix 20 mg IV daily until tolerating oral intake and then resume home dose diuretics likely on discharge.  9.  Well controlled diabetes mellitus type 2 Hemoglobin A1c was 6.6 on 06/13/2016.  Hemoglobin A1c is 6.7.  CBG this morning is 125.  Patient has been started on clear liquids.  Continue to hold oral hypoglycemic agents.  Continue sliding scale insulin.   10 acidosis Secondary to problem #1.  Resolved with bicarb drip.  Bicarb drip has been discontinued per nephrology.  Follow.   11 gastroesophageal reflux disease PPI dose adjusted per gastroenterology to 40 mg daily which patient will be discharged home on.   12 probable vaginal candidiasis Continue IV Diflucan and clotrimazole cream.   13 obesity  14 chronic nonpitting lower extremity edema secondary to chronic venous stasis Stable.  Chest x-ray clear.  Patient was on oral Lasix prior to admission which have been held since admission due to acute renal failure.  Patient now on clears.  Placed on Lasix 20 mg IV daily.  Follow.    DVT prophylaxis: SCDs Code Status: Full Family Communication: Updated  patient.  No family at bedside. Disposition Plan: Likely home pending PT evaluation and when okay with gastroenterology.    Consultants:   Nephrology: Dr.Deterding 05/22/2017  Urology: Dr.Narang 05/22/2017  Gastroenterology: Dr. Ardis Hughs 05/23/2017  Procedures:   CT abdomen and pelvis 05/22/2017, 05/24/2017  Acute abdominal series 05/22/2017, 05/25/2017, 05/26/2017  Renal ultrasound 05/22/2017  1 unit of packed red blood cells 05/22/2017  2 Units packed red blood cells pending 4 05/23/2017  Antimicrobials:   IV Rocephin 05/22/2017   Subjective: Patient states abdominal pain and abdominal distention has improved.  Denies any significant change in shortness of breath.  Denies any chest pain.  States she was turning and mobilizing somewhat yesterday.  No nausea or emesis.  Passing gas.  Some stool per RN  noted in rectal tube which has since been discontinued per gastroenterology.  Patient states the other doctor stated she was going home tomorrow.    Objective: Vitals:   05/26/17 0200 05/26/17 0400 05/26/17 0600 05/26/17 0800  BP: (!) 160/40 (!) 177/59 (!) 168/49 (!) 181/66  Pulse: 80 81 79 76  Resp: (!) 21 (!) 30 15 (!) 21  Temp:    98.6 F (37 C)  TempSrc:    Oral  SpO2: 100% 94% 97% 94%  Weight: 109.4 kg (241 lb 2.9 oz)     Height:        Intake/Output Summary (Last 24 hours) at 05/26/2017 0927 Last data filed at 05/26/2017 0644 Gross per 24 hour  Intake 2150 ml  Output 1900 ml  Net 250 ml   Filed Weights   05/23/17 0129 05/23/17 0556 05/26/17 0200  Weight: 107.6 kg (237 lb 3.4 oz) 107.6 kg (237 lb 3.4 oz) 109.4 kg (241 lb 2.9 oz)    Examination:  General exam: Alert.   Respiratory system: Poor to fair air movement.  Minimal expiratory wheezing.  Decreased breath sounds in the bases.  No rhonchi.   Respiratory effort normal. Cardiovascular system: RRR no murmurs rubs or gallops.  No JVD.   No lower extremity edema. Gastrointestinal system: Abdomen is softer, less distended, less tight, decreased bowel sounds, less tender to palpation in the lower quadrants.  No rebound.  No guarding.   Central nervous system: Alert and oriented.  No focal neurological deficits. Extremities: Symmetric 5 x 5 power. Skin: No rashes, lesions or ulcers Psychiatry: Judgement and insight appear normal. Mood & affect appropriate.     Data Reviewed: I have personally reviewed following labs and imaging studies  CBC: Recent Labs  Lab 05/22/17 1704 05/23/17 0536 05/23/17 1627 05/24/17 0307 05/25/17 0319 05/26/17 0312  WBC 7.6 5.2  --  3.7* 6.6 7.1  HGB 7.3* 6.8* 9.8* 10.6* 9.5* 9.5*  HCT 23.4* 21.5* 30.4* 31.9* 29.6* 30.5*  MCV 63.4* 64.4*  --  68.8* 68.0* 70.6*  PLT 205 161  --  164 162 683   Basic Metabolic Panel: Recent Labs  Lab 05/22/17 2200 05/23/17 0524 05/23/17 0746  05/23/17 1629 05/24/17 0307 05/25/17 0319 05/26/17 0312  NA 139 139  --   --  142 140 143  K 5.7* 5.3* 5.3* 4.7 4.0 3.4* 3.7  CL 113* 109  --   --  105 101 104  CO2 16* 19*  --   --  23 24 23   GLUCOSE 102* 87  --   --  157* 137* 123*  BUN 82* 72*  --   --  48* 32* 20  CREATININE 2.11* 1.75*  --   --  1.27*  1.11* 1.06*  CALCIUM 9.3 8.6*  --   --  8.2* 7.9* 8.1*  MG  --   --   --   --  1.4* 2.1 1.8  PHOS 3.9 4.4  --   --  3.5 3.4 2.7   GFR: Estimated Creatinine Clearance: 47.7 mL/min (A) (by C-G formula based on SCr of 1.06 mg/dL (H)). Liver Function Tests: Recent Labs  Lab 05/22/17 1704 05/22/17 2200 05/23/17 0524 05/24/17 0307 05/25/17 0319 05/26/17 0312  AST 25  --   --   --   --   --   ALT 25  --   --   --   --   --   ALKPHOS 158*  --   --   --   --   --   BILITOT 0.4  --   --   --   --   --   PROT 7.5  --   --   --   --   --   ALBUMIN 3.3* 3.1* 2.8* 2.9* 2.6* 2.4*   Recent Labs  Lab 05/22/17 1704 05/23/17 0746  LIPASE 76* 45  AMYLASE 184*  --    No results for input(s): AMMONIA in the last 168 hours. Coagulation Profile: No results for input(s): INR, PROTIME in the last 168 hours. Cardiac Enzymes: No results for input(s): CKTOTAL, CKMB, CKMBINDEX, TROPONINI in the last 168 hours. BNP (last 3 results) No results for input(s): PROBNP in the last 8760 hours. HbA1C: Recent Labs    05/24/17 0307  HGBA1C 6.7*   CBG: Recent Labs  Lab 05/25/17 0329 05/25/17 0739 05/25/17 1151 05/25/17 1612 05/26/17 0807  GLUCAP 141* 147* 143* 132* 125*   Lipid Profile: No results for input(s): CHOL, HDL, LDLCALC, TRIG, CHOLHDL, LDLDIRECT in the last 72 hours. Thyroid Function Tests: No results for input(s): TSH, T4TOTAL, FREET4, T3FREE, THYROIDAB in the last 72 hours. Anemia Panel: No results for input(s): VITAMINB12, FOLATE, FERRITIN, TIBC, IRON, RETICCTPCT in the last 72 hours. Sepsis Labs: Recent Labs  Lab 05/23/17 0153 05/23/17 0525  LATICACIDVEN 0.8 0.6     Recent Results (from the past 240 hour(s))  MRSA PCR Screening     Status: None   Collection Time: 05/23/17  2:17 AM  Result Value Ref Range Status   MRSA by PCR NEGATIVE NEGATIVE Final    Comment:        The GeneXpert MRSA Assay (FDA approved for NASAL specimens only), is one component of a comprehensive MRSA colonization surveillance program. It is not intended to diagnose MRSA infection nor to guide or monitor treatment for MRSA infections. Performed at [facility], [place] [address] [place], [place] [address]   Culture, Urine     Status: Abnormal   Collection Time: 05/23/17  8:50 AM  Result Value Ref Range Status   Specimen Description   Final    URINE, CLEAN CATCH Performed at [facility], [place] [address] [place], [place] [address]    Special Requests   Final    NONE Performed at [facility], [place] [address] [place], [place] [address]    Culture (A)  Final    <10,000 COLONIES/mL INSIGNIFICANT GROWTH Performed at [place] [address] [place], [place] [address]    Report Status 05/24/2017 FINAL  Final         Radiology Studies: Dg Chest 2 View  Result Date: 05/24/2017 CLINICAL DATA:  Shortness of breath. EXAM: CHEST - 2  VIEW COMPARISON:  Radiographs of May 22, 2017. FINDINGS: Stable cardiomegaly. No pneumothorax or pleural effusion is noted. No acute pulmonary disease is noted. Bony thorax is unremarkable. IMPRESSION: No active cardiopulmonary disease. Electronically Signed   By: Marijo Conception, M.D.   On: 05/24/2017 11:32   Ct Abdomen Pelvis W Contrast  Result Date: 05/24/2017 CLINICAL DATA:  Diffuse abdominal pain for week. Abdominal distension. History of nephrolithiasis, diverticulitis, ventral hernia repair, cholecystectomy, appendectomy and hysterectomy. EXAM: CT ABDOMEN AND PELVIS WITH CONTRAST TECHNIQUE: Multidetector CT imaging of the abdomen and pelvis was performed using the  standard protocol following bolus administration of intravenous contrast. CONTRAST:  56mL ISOVUE-300 IOPAMIDOL (ISOVUE-300) INJECTION 61%, 37mL OMNIPAQUE IOHEXOL 300 MG/ML SOLN COMPARISON:  Abdominal radiograph May 24, 2017 and CT abdomen and pelvis May 22, 2017 and CT abdomen and pelvis February 10, 2015 and CT abdomen and pelvis May 03, 2009. FINDINGS: LOWER CHEST: Mild bibasilar bronchiectasis. 4 mm lingular pulmonary nodule, not definitely included on prior CTs though partially imaged in 2018. No routine indicated follow-up. Heart size is normal. Severe coronary artery calcifications. HEPATOBILIARY: Slightly nodular liver contour. Status post cholecystectomy. PANCREAS: Normal. SPLEEN: Normal. ADRENALS/URINARY TRACT: Kidneys are orthotopic, demonstrating symmetric enhancement. No nephrolithiasis or hydronephrosis. Exophytic nonenhancing 3.8 cm RIGHT lower pole renal mass was 3.4 cm in 2011, likely benign. Too small to characterize hypodensities RIGHT kidney. The unopacified ureters are normal in course and caliber, mild urothelial enhancement. Delayed imaging through the kidneys demonstrates symmetric prompt contrast excretion within the proximal urinary collecting system. Urinary bladder is decompressed by Foley catheter. Stable 3 mm LEFT adrenal adenoma from 2011. STOMACH/BOWEL: Colonic air contrast levels with small amount of retained large bowel, large bowel measures to 8.2 cm. Relatively decompressed small bowel. VASCULAR/LYMPHATIC: Aortoiliac vessels are normal in course and caliber. Moderate calcific atherosclerosis. No lymphadenopathy by CT size criteria. REPRODUCTIVE: Status post hysterectomy. OTHER: Protuberant abdomen. Small volume free fluid RIGHT upper quadrant. No drainable fluid collection. No intraperitoneal free air. MUSCULOSKELETAL: Nonacute. Osteopenia. Advanced lower lumbar facet arthropathy. IMPRESSION: 1. 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 No bowel obstruction. Colonic  diverticulosis. 2. Mildly nodular liver compatible with cirrhosis with new small volume ascites. 3. Mild suspected pyelitis, recommend correlation with urinary analysis. Aortic Atherosclerosis (ICD10-I70.0). Electronically Signed   By: Elon Alas M.D.   On: 05/24/2017 15:23   Dg Abd 2 Views  Result Date: 05/26/2017 CLINICAL DATA:  Abdominal distention. EXAM: ABDOMEN - 2 VIEW COMPARISON:  05/25/2017 FINDINGS: Bowel gas pattern is nonobstructive with air and contrast throughout the colon. Spherical air-filled structure projects over the anal rectal junction unchanged likely rectal catheter. No evidence of free peritoneal air. IMPRESSION: Nonobstructive bowel gas pattern. Electronically Signed   By: Marin Olp M.D.   On: 05/26/2017 09:10   Dg Abd 2 Views  Result Date: 05/25/2017 CLINICAL DATA:  84 year old female with abdominal pain and distension. Large bowel distention on CT Abdomen and Pelvis yesterday. EXAM: ABDOMEN - 2 VIEW COMPARISON:  CT Abdomen and Pelvis 05/24/2017 and earlier. FINDINGS: Supine and left-side-down lateral decubitus views of the abdomen. No pneumoperitoneum. Stable gas-filled mildly to moderately dilated large bowel since the CT yesterday, most pronounced in the cecum which is estimated at 12-13 centimeters diameter. The administered oral contrast is now retained from the splenic flexure distally. Diverticulosis redemonstrated. Rectal prolapse re- demonstrated. Stable cholecystectomy clips. No acute osseous abnormality identified. IMPRESSION: 1. Stable gas distended large bowel with passage of all oral contrast administered yesterday to the splenic flexure arguing against  obstruction. 2. No free air. 3. Diverticulosis of the large bowel, rectal prolapse. Electronically Signed   By: Genevie Ann M.D.   On: 05/25/2017 09:32   Dg Abd 2 Views  Result Date: 05/24/2017 CLINICAL DATA:  Generalized abdominal pain and distention. EXAM: ABDOMEN - 2 VIEW COMPARISON:  Radiographs May 22, 2017. FINDINGS: No free air is noted to suggest pneumoperitoneum. Air-filled colonic distention is now noted without small bowel dilatation. Status post cholecystectomy. No abnormal calcifications are noted. IMPRESSION: Interval development air-filled colonic distention is noted which may represent ileus. No small bowel dilatation is noted. Electronically Signed   By: Marijo Conception, M.D.   On: 05/24/2017 11:30        Scheduled Meds: . allopurinol  300 mg Oral Daily  . clotrimazole  1 Applicatorful Vaginal QHS  . docusate sodium  100 mg Oral Q12H  . Gerhardt's butt cream   Topical TID  . insulin aspart  0-15 Units Subcutaneous Q4H  . iron polysaccharides  150 mg Oral BID  . mouth rinse  15 mL Mouth Rinse BID  . metoprolol tartrate  5 mg Intravenous Q8H  . pantoprazole  40 mg Oral Q1200   Continuous Infusions: . sodium chloride 75 mL/hr at 05/26/17 0644  . cefTRIAXone (ROCEPHIN)  IV Stopped (05/25/17 2315)  . fluconazole (DIFLUCAN) IV Stopped (05/25/17 2225)  . magnesium sulfate 1 - 4 g bolus IVPB 4 g (05/26/17 0905)     LOS: 4 days    Time spent: 40 minutes    Irine Seal, MD [facility] Pager [phone]  If 7PM-7AM, please contact night-coverage [email or web address] Password [facility] 05/26/2017, 9:27 AM

## 2017-05-27 ENCOUNTER — Inpatient Hospital Stay (HOSPITAL_COMMUNITY): Payer: Medicare HMO

## 2017-05-27 DIAGNOSIS — E8770 Fluid overload, unspecified: Secondary | ICD-10-CM

## 2017-05-27 LAB — CBC
HCT: 30.6 % — ABNORMAL LOW (ref 36.0–46.0)
Hemoglobin: 9.5 g/dL — ABNORMAL LOW (ref 12.0–15.0)
MCH: 21.9 pg — ABNORMAL LOW (ref 26.0–34.0)
MCHC: 31 g/dL (ref 30.0–36.0)
MCV: 70.5 fL — ABNORMAL LOW (ref 78.0–100.0)
Platelets: 159 10*3/uL (ref 150–400)
RBC: 4.34 MIL/uL (ref 3.87–5.11)
RDW: 22 % — AB (ref 11.5–15.5)
WBC: 6.8 10*3/uL (ref 4.0–10.5)

## 2017-05-27 LAB — GLUCOSE, CAPILLARY
GLUCOSE-CAPILLARY: 162 mg/dL — AB (ref 65–99)
GLUCOSE-CAPILLARY: 150 mg/dL — AB (ref 65–99)
GLUCOSE-CAPILLARY: 217 mg/dL — AB (ref 65–99)
GLUCOSE-CAPILLARY: 169 mg/dL — AB (ref 65–99)
Glucose-Capillary: 283 mg/dL — ABNORMAL HIGH (ref 65–99)
Glucose-Capillary: 154 mg/dL — ABNORMAL HIGH (ref 65–99)

## 2017-05-27 LAB — RENAL FUNCTION PANEL
ANION GAP: 11 (ref 5–15)
Albumin: 2.6 g/dL — ABNORMAL LOW (ref 3.5–5.0)
BUN: 14 mg/dL (ref 6–20)
CALCIUM: 8.2 mg/dL — AB (ref 8.9–10.3)
CO2: 26 mmol/L (ref 22–32)
Chloride: 99 mmol/L — ABNORMAL LOW (ref 101–111)
Creatinine, Ser: 0.96 mg/dL (ref 0.44–1.00)
GFR calc Af Amer: >60 mL/min (ref >60)
GFR calc non Af Amer: 53 mL/min — ABNORMAL LOW (ref >60)
GLUCOSE: 167 mg/dL — AB (ref 65–99)
PHOSPHORUS: 3 mg/dL (ref 2.5–4.6)
Potassium: 3.7 mmol/L (ref 3.5–5.1)
SODIUM: 136 mmol/L (ref 135–145)

## 2017-05-27 LAB — MAGNESIUM: MAGNESIUM: 2 mg/dL (ref 1.7–2.4)

## 2017-05-27 MED ORDER — FUROSEMIDE 10 MG/ML IJ SOLN
40.0000 mg | Freq: Two times a day (BID) | INTRAMUSCULAR | Status: DC
  Administered 2017-05-27 – 2017-05-30 (×7): 40 mg via INTRAVENOUS
  Filled 2017-05-27 (×7): qty 4

## 2017-05-27 MED ORDER — POTASSIUM CHLORIDE 10 MEQ/100ML IV SOLN
10.0000 meq | INTRAVENOUS | Status: AC
  Administered 2017-05-27 (×4): 10 meq via INTRAVENOUS
  Filled 2017-05-27 (×4): qty 100

## 2017-05-27 NOTE — Progress Notes (Signed)
CSW consulted to assess potential SNF placement.  Discussed with pt's husband- he reports pt went to SNF for rehab 4 years ago "but we talked about it and we decline that this time." States pt has had home health in the past (did not name agency) and they would be open to that service again if appropriate. Due to husband's time constraints did not discuss home needs in detail.   CSW will follow if change in needs/plan, please reconsult, will sign off for now.  Sharren Bridge, MSW, LCSW Clinical Social Work 05/27/2017 [phone] weekend coverage

## 2017-05-27 NOTE — Progress Notes (Signed)
[facility] Progress Note    Since last GI note: Distention seems to be returning since allowing liquids yesterday.  No nausea, vomiting. Continued mild abd pain, better overall but not gone.  Objective: Vital signs in last 24 hours: Temp:  [97.8 F (36.6 C)-99.5 F (37.5 C)] 98.7 F (37.1 C) (05/19 0800) Pulse Rate:  [69-85] 69 (05/19 0400) Resp:  [18-35] 20 (05/19 0400) BP: (153-195)/(40-85) 166/55 (05/19 0400) SpO2:  [91 %-98 %] 98 % (05/19 0400) Weight:  [242 lb 8.1 oz (110 kg)] 242 lb 8.1 oz (110 kg) (05/19 0500) Last BM Date: 05/26/17 General: alert and oriented times 3 Heart: regular rate and rythm Abdomen: soft, moderate distension, + bowel sounds   Lab Results: Recent Labs    05/25/17 0319 05/26/17 0312 05/27/17 0318  WBC 6.6 7.1 6.8  HGB 9.5* 9.5* 9.5*  PLT 162 182 159  MCV 68.0* 70.6* 70.5*   Recent Labs    05/25/17 0319 05/26/17 0312 05/27/17 0318  NA 140 143 136  K 3.4* 3.7 3.7  CL 101 104 99*  CO2 24 23 26   GLUCOSE 137* 123* 167*  BUN 32* 20 14  CREATININE 1.11* 1.06* 0.96  CALCIUM 7.9* 8.1* 8.2*   Recent Labs    05/25/17 0319 05/26/17 0312 05/27/17 0318  ALBUMIN 2.6* 2.4* 2.6*   Studies/Results: Dg Abd 2 Views  Result Date: 05/26/2017 CLINICAL DATA:  Abdominal distention. EXAM: ABDOMEN - 2 VIEW COMPARISON:  05/25/2017 FINDINGS: Bowel gas pattern is nonobstructive with air and contrast throughout the colon. Spherical air-filled structure projects over the anal rectal junction unchanged likely rectal catheter. No evidence of free peritoneal air. IMPRESSION: Nonobstructive bowel gas pattern. Electronically Signed   By: Marin Olp M.D.   On: 05/26/2017 09:10     Medications: Scheduled Meds: . allopurinol  300 mg Oral Daily  . clotrimazole  1 Applicatorful Vaginal QHS  . docusate sodium  100 mg Oral Q12H  . furosemide  40 mg Intravenous Q12H  . Gerhardt's butt cream   Topical TID  . insulin aspart  0-15 Units Subcutaneous  Q4H  . iron polysaccharides  150 mg Oral BID  . mouth rinse  15 mL Mouth Rinse BID  . metoprolol tartrate  10 mg Intravenous Q8H  . pantoprazole  40 mg Oral Q1200   Continuous Infusions: . cefTRIAXone (ROCEPHIN)  IV Stopped (05/26/17 2102)  . fluconazole (DIFLUCAN) IV 100 mg (05/26/17 2000)  . potassium chloride     PRN Meds:.acetaminophen **OR** acetaminophen, lip balm, morphine injection, ondansetron **OR** ondansetron (ZOFRAN) IV    Assessment/Plan: 84 y.o. female admitted with UTI, hydronephosis  Found to have acute on chronic anemia (Hb baseline 10, admit 7.3).  She has thallasemia minor and so has chronic anemia, profound microcytosis.  Has not had colonoscopy or EGD in >10 years and I'm not sure she should have them now given her frailty, mult comorbid issues.  She's had NO overt bleeding. Do not have heme testing of stool thus far.  Colonic ileus (by imaging, clinically) had been improving with great staff attention mobilizing her frequently however after allowing clears yesterday her distension has returned.  KUB ordered, I will change back to NPO. Staff needs to continue focusing on mobilizing her as outlined in previous notes.    Will follow along.   Milus Banister, MD  05/27/2017, 9:51 AM [facility] Pager [phone]

## 2017-05-27 NOTE — Progress Notes (Signed)
PROGRESS NOTE    Lauren Crosby  DVV:[id number] DOB: 1931/11/17 DOA: 05/22/2017 PCP: [name], MD    Brief Narrative:  Lauren Crosby is a 84 y.o. female with medical history significant of DM2, HTN, nephrolithiasis x7 times (last 6-7 years ago), diverticulitis.  Patient has had progressive abd pain for 1 week with poor PO intake.  Abd pain is diffuse, no V/D.  No fevers nor chills.  No dysuria.  Patient went to PCPs office today, sent in to ED for abnormal labs.   ED Course: Cr 2.35 (up from baseline 1.1), bicarb 15, K 6.8.  [place] was negative for hydronephrosis.  Dr. Jimmy Footman consulted.  CT abd/pelvis actually came back positive for L hydronephrosis.  CT also has a slow growing R infrarenal mass slow enlargement since 2011, also shows a 3.4cm L adrenal adenoma which is known based on PMHx (confident that CT scan is on the correct patient).  Urology and nephrology consulted and following.  Catheter placed.  Renal function improved.  Patient subsequently developed worsening abdominal distention and pain abdominal films and CT abdomen and pelvis consistent with colonic ileus.  GI consulted for colonic ileus and anemia.    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  Patient noted to be hyperkalemic with a acidosis as well on admission which have since improved.  CT abdomen and pelvis consistent with left hydronephrosis with questionable etiology/obstructive uropathy.  Foley catheter has been placed.  Patient with a urine output of 475 cc over the past 24 hours however doubt this is accurate.  Creatinine currently at 0.96 from 1.06 from 1.11 from 1.27 from 1.75 from 2.35 on admission.  Bicarb drip has been discontinued per nephrology.  Will avoid ACE inhibitor/ARB and NSAIDs with recent acute renal failure and hyperkalemia per nephrology recommendations.   2.  Left hydronephrosis Questionable etiology.  Patient assessed by urology who feel could be secondary to pyelonephritis versus obstructive uropathy secondary to diabetic cytopathy versus bladder cancer recurrence.  Foley catheter in place.  Patient with a urine output of 475 cc over the past 24 hours however doubt this is correct. Creatinine improved and currently at 0.96.  Urinalysis with large leukocytes, negative nitrite, greater than 50 WBCs.  Urine cultures with less than 10,000 colonies however urinalysis significantly consistent with UTI.  Patient also noted to have mild pyelitis on CT abdomen and pelvis on 05/24/2017.  Continue empiric IV Rocephin for 7-10 days to complete course of antibiotic treatment.  Foley catheter in place.  Likely will  be discharged with Foley catheter with outpatient follow-up with urology.    3 Colonic ileus Patient complained of significant abdominal pain the morning of 05/24/2017 with increased abdominal distention, tightness.  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  Colonic diameter was 8.2 cm from CT abdomen and pelvis 05/24/2017.  Patient with no emesis.  Patient unable to tolerate NG tube on 05/24/2017.  Rectal tube was placed subsequently been discontinued by gastroenterology.  Repeat abdominal films  05/26/2017 with improvement.  Patient was started on clears which she tolerated.  Patient now with some abdominal distention and hypoactive bowel sounds.  Repeat abdominal films this morning.  Keep on clears for now until advanced per GI. Keep potassium greater than 4.  Keep magnesium greater than 2.  Continue frequent turning 2 hours on the left, 2 hours on the right, out of bed to chair and mobilize as tolerated.  Frequent turning.  Per GI.    4.  UTI/pyelitis Urinalysis consistent with a urinary tract infection.  Per GI likely etiology of colonic ileus and abdominal pain.  Urine cultures with less than 10,000 colonies.  Urinalysis however turbid, large leukocytes, greater than 50 WBCs, WBC clumps present, many bacteria.  CT abdomen and pelvis showing a mild pyelitis.  Patient had also presented with hydronephrosis.  Continue empiric IV Rocephin and treat for total of 7 to 10 days.  Likely transition to oral antibiotics on discharge.  5.  Acute on chronic microcytic anemia/iron deficiency anemia/history of thalassemia Hemoglobin was at 7.3 on admission from 9.9 January 12, 2017.  Hemoglobin this morning is stable at 9.5 from 9.5 from 10.6 from 9.8 from 6.8 after transfusion of 3 units of packed red blood cells on 05/23/2017.Marland Kitchen  Patient with no overt bleeding noted.  Given a dose of IV Feraheme on 05/26/2017.  When back on a diet will resume oral iron supplementation.  Continue PPI.   Patient with prior history of gastritis per endoscopy of 2005 and history of colonic polyps per colonoscopy of 2008.  Patient seen in consultation by GI and patient was scheduled for EGD and colonoscopy however due to Ogilvie's syndrome/colonic ileus EGD/colonoscopy will be canceled for now and will need to be done in the outpatient setting.  GI following.  Follow H&H and transfuse for hemoglobin less than 7.   6.  MASD Continue current wound care.  7.  Hyperkalemia Secondary to problem #1.  Resolved on Kayexalate.  Potassium  currently at 3.7.  Give 4 runs of IV potassium as patient now with colonic ileus and IV Lasix.  Follow.  8.  Hypertension ACE inhibitor on hold will likely not resume for at least 8 weeks due to problem #1.  Blood pressure elevated overnight and this morning in the 170s to 180s.  Continue IV Lopressor 10 mg every 8 hours.  Patient has been started on clears which she is tolerating.  Will saline lock IV fluids.  Patient was on oral Lasix prior to admission which were held on admission secondary to problem #1.  Increase Lasix to 40 mg IV every 12 hours until tolerating solid diet and then transition back to home dose oral Lasix.   9.  Well controlled diabetes mellitus type 2 Hemoglobin A1c was 6.6 on 06/13/2016.  Hemoglobin A1c is 6.7.  CBG this morning is 150.  Patient has been started on clear liquids.  Continue to hold oral hypoglycemic agents.  Continue sliding scale insulin.   10 acidosis Secondary to problem #1.  Improved on bicarb drip.  Bicarb drip has been discontinued.  Follow.   11 gastroesophageal reflux disease PPI dose adjusted per gastroenterology to 40 mg daily which patient will be  discharged home on.   12 probable vaginal candidiasis Continue IV Diflucan and clotrimazole cream.   13 obesity  14 chronic lower extremity edema secondary to chronic venous stasis Stable.  Now with some minimal expiratory wheezing.  Repeat chest x-ray.   Patient was on oral Lasix prior to admission which have been held since admission due to acute renal failure.  Patient now on clears.  Change IV Lasix to 40 mg IV every 12 hours.  Strict I's and O's.  Daily weights.   15.  Volume overload Patient with concern for volume overload.  Patient with some minimal expiratory wheezing.  Patient with some 1+ bilateral lower extremity edema.  Patient with chronic lower extremity edema and chronic venous stasis.  Patient's diuretics were held on admission and patient started on Lasix 20 mg IV daily on 05/26/2017.   Current weight is 242 pounds from 237 pounds on admission.  Increase IV Lasix to 40 mg every 12 hours.  Strict I's and O's.  Daily weights. Follow.    DVT prophylaxis: SCDs Code Status: Full Family Communication: Updated patient.  No family at bedside. Disposition Plan: Likely home with home health once clinically improved and okay with GI.    Consultants:   Nephrology: Dr.Deterding 05/22/2017  Urology: Dr.Narang 05/22/2017  Gastroenterology: Dr. Ardis Hughs 05/23/2017  Procedures:   CT abdomen and pelvis 05/22/2017, 05/24/2017  Acute abdominal series 05/22/2017, 05/25/2017, 05/26/2017  Renal ultrasound 05/22/2017  1 unit of packed red blood cells 05/22/2017  2 Units packed red blood cells pending 4 05/23/2017  Antimicrobials:   IV Rocephin 05/22/2017   Subjective: Patient sleeping however easily arousable.  Denies any abdominal pain.  Passing flatus.  No bowel movement.  Notices some wheezing.  Feels shortness of breath has not changed from baseline.  No chest pain.   Objective: Vitals:   05/27/17 0100 05/27/17 0400 05/27/17 0500 05/27/17 0800  BP: (!) 168/64 (!) 166/55    Pulse: 81 69    Resp: 18 20    Temp:    98.7 F (37.1 C)  TempSrc:    Oral  SpO2: 98% 98%    Weight:   110 kg (242 lb 8.1 oz)   Height:        Intake/Output Summary (Last 24 hours) at 05/27/2017 0910 Last data filed at 05/27/2017 0455 Gross per 24 hour  Intake 667 ml  Output 475 ml  Net 192 ml   Filed Weights   05/23/17 0556 05/26/17 0200 05/27/17 0500  Weight: 107.6 kg (237 lb 3.4 oz) 109.4 kg (241 lb 2.9 oz) 110 kg (242 lb 8.1 oz)    Examination:  General exam: Alert.   Respiratory system: Poor to fair air movement.  Minimal expiratory wheezing.  No rhonchi.   Respiratory effort normal. Cardiovascular system: Regular rate rhythm no murmurs rubs or gallops.  No JVD.  1+ bilateral lower extremity edema.  Gastrointestinal system: Abdomen is softer, mildly distended, less tight, no active bowel  sounds.  Nontender to palpation.  No rebound.  No guarding.   Central nervous system: Alert and oriented.  No focal neurological deficits. Extremities: Symmetric 5 x 5 power. Skin: No rashes, lesions or ulcers Psychiatry: Judgement and insight appear normal. Mood & affect appropriate.     Data Reviewed: I have personally reviewed following labs and imaging studies  CBC: Recent Labs  Lab 05/23/17 0536 05/23/17 1627 05/24/17 0307 05/25/17 0319 05/26/17 0312 05/27/17 0318  WBC 5.2  --  3.7* 6.6 7.1 6.8  HGB  6.8* 9.8* 10.6* 9.5* 9.5* 9.5*  HCT 21.5* 30.4* 31.9* 29.6* 30.5* 30.6*  MCV 64.4*  --  68.8* 68.0* 70.6* 70.5*  PLT 161  --  164 162 182 024   Basic Metabolic Panel: Recent Labs  Lab 05/23/17 0524  05/23/17 1629 05/24/17 0307 05/25/17 0319 05/26/17 0312 05/27/17 0318  NA 139  --   --  142 140 143 136  K 5.3*   < > 4.7 4.0 3.4* 3.7 3.7  CL 109  --   --  105 101 104 99*  CO2 19*  --   --  23 24 23 26   GLUCOSE 87  --   --  157* 137* 123* 167*  BUN 72*  --   --  48* 32* 20 14  CREATININE 1.75*  --   --  1.27* 1.11* 1.06* 0.96  CALCIUM 8.6*  --   --  8.2* 7.9* 8.1* 8.2*  MG  --   --   --  1.4* 2.1 1.8 2.0  PHOS 4.4  --   --  3.5 3.4 2.7 3.0   < > = values in this interval not displayed.   GFR: Estimated Creatinine Clearance: 52.9 mL/min (by C-G formula based on SCr of 0.96 mg/dL). Liver Function Tests: Recent Labs  Lab 05/22/17 1704  05/23/17 0524 05/24/17 0307 05/25/17 0319 05/26/17 0312 05/27/17 0318  AST 25  --   --   --   --   --   --   ALT 25  --   --   --   --   --   --   ALKPHOS 158*  --   --   --   --   --   --   BILITOT 0.4  --   --   --   --   --   --   PROT 7.5  --   --   --   --   --   --   ALBUMIN 3.3*   < > 2.8* 2.9* 2.6* 2.4* 2.6*   < > = values in this interval not displayed.   Recent Labs  Lab 05/22/17 1704 05/23/17 0746  LIPASE 76* 45  AMYLASE 184*  --    No results for input(s): AMMONIA in the last 168 hours. Coagulation  Profile: No results for input(s): INR, PROTIME in the last 168 hours. Cardiac Enzymes: No results for input(s): CKTOTAL, CKMB, CKMBINDEX, TROPONINI in the last 168 hours. BNP (last 3 results) No results for input(s): PROBNP in the last 8760 hours. HbA1C: No results for input(s): HGBA1C in the last 72 hours. CBG: Recent Labs  Lab 05/26/17 1645 05/26/17 1931 05/26/17 2326 05/27/17 0453 05/27/17 0734  GLUCAP 204* 295* 204* 162* 150*   Lipid Profile: No results for input(s): CHOL, HDL, LDLCALC, TRIG, CHOLHDL, LDLDIRECT in the last 72 hours. Thyroid Function Tests: No results for input(s): TSH, T4TOTAL, FREET4, T3FREE, THYROIDAB in the last 72 hours. Anemia Panel: No results for input(s): VITAMINB12, FOLATE, FERRITIN, TIBC, IRON, RETICCTPCT in the last 72 hours. Sepsis Labs: Recent Labs  Lab 05/23/17 0153 05/23/17 0525  LATICACIDVEN 0.8 0.6    Recent Results (from the past 240 hour(s))  MRSA PCR Screening     Status: None   Collection Time: 05/23/17  2:17 AM  Result Value Ref Range Status   MRSA by PCR NEGATIVE NEGATIVE Final    Comment:        The GeneXpert MRSA Assay (FDA approved for NASAL specimens  only), is one component of a comprehensive MRSA colonization surveillance program. It is not intended to diagnose MRSA infection nor to guide or monitor treatment for MRSA infections. Performed at [facility], [place] [address], [place], [place] [address]   Culture, Urine     Status: Abnormal   Collection Time: 05/23/17  8:50 AM  Result Value Ref Range Status   Specimen Description   Final    URINE, CLEAN CATCH Performed at [facility], [place] [address], [place], [place] [address]    Special Requests   Final    NONE Performed at [facility], [place] [address], [place], [place] [address]    Culture (A)  Final    <10,000 COLONIES/mL INSIGNIFICANT GROWTH Performed at [place] [address],  [place], [place] [address]    Report Status 05/24/2017 FINAL  Final         Radiology Studies: Dg Abd 2 Views  Result Date: 05/26/2017 CLINICAL DATA:  Abdominal distention. EXAM: ABDOMEN - 2 VIEW COMPARISON:  05/25/2017 FINDINGS: Bowel gas pattern is nonobstructive with air and contrast throughout the colon. Spherical air-filled structure projects over the anal rectal junction unchanged likely rectal catheter. No evidence of free peritoneal air. IMPRESSION: Nonobstructive bowel gas pattern. Electronically Signed   By: Marin Olp M.D.   On: 05/26/2017 09:10        Scheduled Meds: . allopurinol  300 mg Oral Daily  . clotrimazole  1 Applicatorful Vaginal QHS  . docusate sodium  100 mg Oral Q12H  . furosemide  20 mg Intravenous Daily  . Gerhardt's butt cream   Topical TID  . insulin aspart  0-15 Units Subcutaneous Q4H  . iron polysaccharides  150 mg Oral BID  . mouth rinse  15 mL Mouth Rinse BID  . metoprolol tartrate  10 mg Intravenous Q8H  . pantoprazole  40 mg Oral Q1200   Continuous Infusions: . cefTRIAXone (ROCEPHIN)  IV Stopped (05/26/17 2102)  . fluconazole (DIFLUCAN) IV 100 mg (05/26/17 2000)     LOS: 5 days    Time spent: 40 minutes    Irine Seal, MD [facility] Pager [phone]  If 7PM-7AM, please contact night-coverage [email or web address] Password [email or web address] 05/27/2017, 9:10 AM

## 2017-05-28 DIAGNOSIS — K567 Ileus, unspecified: Secondary | ICD-10-CM

## 2017-05-28 LAB — CBC
HEMATOCRIT: 30.7 % — AB (ref 36.0–46.0)
Hemoglobin: 9.6 g/dL — ABNORMAL LOW (ref 12.0–15.0)
MCH: 22 pg — ABNORMAL LOW (ref 26.0–34.0)
MCHC: 31.3 g/dL (ref 30.0–36.0)
MCV: 70.3 fL — AB (ref 78.0–100.0)
Platelets: 140 10*3/uL — ABNORMAL LOW (ref 150–400)
RBC: 4.37 MIL/uL (ref 3.87–5.11)
RDW: 21.8 % — ABNORMAL HIGH (ref 11.5–15.5)
WBC: 5.8 10*3/uL (ref 4.0–10.5)

## 2017-05-28 LAB — RENAL FUNCTION PANEL
ANION GAP: 12 (ref 5–15)
Albumin: 2.6 g/dL — ABNORMAL LOW (ref 3.5–5.0)
BUN: 13 mg/dL (ref 6–20)
CHLORIDE: 95 mmol/L — AB (ref 101–111)
CO2: 28 mmol/L (ref 22–32)
Calcium: 8.3 mg/dL — ABNORMAL LOW (ref 8.9–10.3)
Creatinine, Ser: 1.03 mg/dL — ABNORMAL HIGH (ref 0.44–1.00)
GFR calc non Af Amer: 49 mL/min — ABNORMAL LOW (ref >60)
GFR, EST AFRICAN AMERICAN: 57 mL/min — AB (ref >60)
Glucose, Bld: 123 mg/dL — ABNORMAL HIGH (ref 65–99)
POTASSIUM: 3.7 mmol/L (ref 3.5–5.1)
Phosphorus: 2.6 mg/dL (ref 2.5–4.6)
Sodium: 135 mmol/L (ref 135–145)

## 2017-05-28 LAB — GLUCOSE, CAPILLARY
GLUCOSE-CAPILLARY: 119 mg/dL — AB (ref 65–99)
GLUCOSE-CAPILLARY: 207 mg/dL — AB (ref 65–99)
Glucose-Capillary: 155 mg/dL — ABNORMAL HIGH (ref 65–99)
Glucose-Capillary: 312 mg/dL — ABNORMAL HIGH (ref 65–99)

## 2017-05-28 MED ORDER — POTASSIUM CHLORIDE CRYS ER 20 MEQ PO TBCR
40.0000 meq | EXTENDED_RELEASE_TABLET | Freq: Once | ORAL | Status: AC
  Administered 2017-05-28: 40 meq via ORAL

## 2017-05-28 MED ORDER — RAMELTEON 8 MG PO TABS
8.0000 mg | ORAL_TABLET | Freq: Every day | ORAL | Status: DC
  Administered 2017-05-28 – 2017-05-31 (×5): 8 mg via ORAL
  Filled 2017-05-28 (×5): qty 1

## 2017-05-28 NOTE — Progress Notes (Signed)
Report was called but RN was unavailable. Will call  Back in 33mn

## 2017-05-28 NOTE — Progress Notes (Signed)
PROGRESS NOTE    Lauren Crosby  YPP:[id number] DOB: 01/28/1931 DOA: 05/22/2017 PCP: [name], MD    Brief Narrative:  Lauren Crosby is a 84 y.o. female with medical history significant of DM2, HTN, nephrolithiasis x7 times (last 6-7 years ago), diverticulitis.  Patient has had progressive abd pain for 1 week with poor PO intake.  Abd pain is diffuse, no V/D.  No fevers nor chills.  No dysuria.  Patient went to PCPs office today, sent in to ED for abnormal labs.   ED Course: Cr 2.35 (up from baseline 1.1), bicarb 15, K 6.8.  [place] was negative for hydronephrosis.  Dr. Jimmy Footman consulted.  CT abd/pelvis actually came back positive for L hydronephrosis.  CT also has a slow growing R infrarenal mass slow enlargement since 2011, also shows a 3.4cm L adrenal adenoma which is known based on PMHx (confident that CT scan is on the correct patient).  Urology and nephrology consulted and following.  Catheter placed.  Renal function improved.  Patient subsequently developed worsening abdominal distention and pain abdominal films and CT abdomen and pelvis consistent with colonic ileus.  GI consulted for colonic ileus and anemia.    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  Patient noted to be hyperkalemic with a acidosis as well on admission which have since improved.  CT abdomen and pelvis consistent with left hydronephrosis with questionable etiology/obstructive uropathy.  Foley catheter has been placed.  Patient with a urine output of 4.575 L over the past 24 hours.  Creatinine currently at 1.03 from 0.96 from 1.06 from 1.11 from 1.27 from 1.75 from 2.35 on admission.  Bicarb drip has been discontinued per nephrology.  Will avoid ACE inhibitor/ARB and NSAIDs with recent acute renal failure and hyperkalemia per nephrology recommendations.   2.  Left hydronephrosis Questionable etiology.  Patient assessed by urology who feel could be secondary to pyelonephritis versus obstructive uropathy secondary to diabetic cytopathy versus bladder cancer recurrence.  Foley catheter in place.  Patient with a urine output of 475 cc over the past 24 hours however doubt this is correct. Creatinine improved and currently at 0.96.  Urinalysis with large leukocytes, negative nitrite, greater than 50 WBCs.  Urine cultures with less than 10,000 colonies however urinalysis significantly consistent with UTI.  Patient also noted to have mild pyelitis on CT abdomen and pelvis on 05/24/2017.  Continue empiric IV Rocephin for 7-10 days to complete course of antibiotic treatment.  Patient will likely be discharged with Foley catheter with outpatient follow-up with urology.   3 Colonic ileus Patient complained of significant abdominal pain the morning of 05/24/2017 with increased abdominal distention, tightness.  Abdominal films and CT abdomen and pelvis consistent with Ogilvie's syndrome/significant colonic  ileus.  Colonic diameter was 8.2 cm from CT abdomen and pelvis 05/24/2017.  Patient with no emesis.  Patient unable to tolerate NG tube on 05/24/2017.  Rectal tube was placed subsequently been discontinued by gastroenterology.  Repeat abdominal films 05/26/2017 with improvement.  Patient  was started on clears which she tolerated.  Due to worsening abdominal distention and hypoactive bowel sounds on 05/27/2017 patient was made n.p.o.  Clinical improvement in terms of abdominal distention and decreased bowel sounds.  Patient with bowel movement yesterday.  Patient passing flatus.  Keep potassium greater than 4.  Keep magnesium greater than 2.  Continue frequent turning 2 hours on the left, 2 hours on the right, out of bed to chair and mobilize as tolerated.  Frequent turning.  Per GI.    4.  UTI/pyelitis Urinalysis consistent with a urinary tract infection.  Per GI likely etiology of colonic ileus and abdominal pain.  Urine cultures with less than 10,000 colonies.  Urinalysis however turbid, large leukocytes, greater than 50 WBCs, WBC clumps present, many bacteria.  CT abdomen and pelvis showing a mild pyelitis.  Patient had also presented with hydronephrosis.  Continue empiric IV Rocephin and treat for total of 7 to 10 days.  Likely transition to oral antibiotics on discharge.  5.  Acute on chronic microcytic anemia/iron deficiency anemia/history of thalassemia Hemoglobin was at 7.3 on admission from 9.9 January 12, 2017.  Hemoglobin this morning is stable at 9.6 from 9.5 from 9.5 from 10.6 from 9.8 from 6.8 after transfusion of 3 units of packed red blood cells on 05/23/2017.Marland Kitchen  Patient with no overt bleeding noted.  Given a dose of IV Feraheme on 05/26/2017.  When back on a diet will resume oral iron supplementation.  Continue PPI.   Patient with prior history of gastritis per endoscopy of 2005 and history of colonic polyps per colonoscopy of 2008.  Patient seen in consultation by GI and patient was scheduled for EGD and colonoscopy however due to Ogilvie's syndrome/colonic ileus EGD/colonoscopy has been canceled for now and will need to be done in the outpatient setting.  GI following.  Follow H&H and transfuse for hemoglobin less than 7.   6.  MASD Continue current wound care.  7.   Hyperkalemia Secondary to problem #1.  Resolved on Kayexalate.  Potassium currently at 3.7.  We will give a dose of oral potassium 40 mEq p.o. x1 as patient with colonic ileus and also on IV diuretics.  Follow.  8.  Hypertension ACE inhibitor on hold will likely not resume for at least 8 weeks due to problem #1.  Blood pressure elevated overnight and this morning in the 170s.  Continue IV Lopressor 10 mg every 8 hours.  Continue IV Lasix every 12 hours.  Patient was on oral diuretics prior to admission which were held secondary to problem #1.  Follow.    9.  Well controlled diabetes mellitus type 2 Hemoglobin A1c was 6.6 on 06/13/2016.  Hemoglobin A1c is 6.7.  CBG this morning is 119-150.  Patient will be restarted on clear liquids today.  Continue to hold oral hypoglycemic agents.  Sliding scale insulin.   10 acidosis Secondary to problem #1.  Resolved on a bicarb drip.   11 gastroesophageal reflux disease PPI dose adjusted per gastroenterology to 40 mg daily which patient will be discharged home on.   12 probable vaginal candidiasis Continue IV Diflucan and clotrimazole cream.   13 obesity  14 chronic lower extremity edema secondary to chronic venous  stasis Stable.  Minimal expiratory wheezing improved after patient placed on increased dose of IV Lasix yesterday.  Oral Lasix were held on admission due to acute renal failure.  Continue current dose of IV Lasix 40 mg every 12 hours.  Strict I's and O's.  Daily weights.    15.  Volume overload Patient with concern for volume overload.  Patient with some minimal expiratory wheezing on 05/27/2017.  Patient also noted with some lower extremity edema.  Wheezing has improved with diuresis.  Current weight is 240 pounds today from 242 pounds on 05/27/2017 from 237 pounds on admission.  Patient with a urine output of 4.5 L over the past 24 hours.  Continue current dose of Lasix 40 mg IV every 12 hours.  Strict I's and O's.  Daily weights.    DVT  prophylaxis: SCDs Code Status: Full Family Communication: Updated patient.  No family at bedside. Disposition Plan: Transfer to [place].  Likely home with home health once clinically improved and okay with GI.    Consultants:   Nephrology: Dr.Deterding 05/22/2017  Urology: Dr.Narang 05/22/2017  Gastroenterology: Dr. Ardis Hughs 05/23/2017  Procedures:   CT abdomen and pelvis 05/22/2017, 05/24/2017  Acute abdominal series 05/22/2017, 05/25/2017, 05/26/2017, 05/27/2017  Renal ultrasound 05/22/2017  1 unit of packed red blood cells 05/22/2017  2 Units packed red blood cells pending 4 05/23/2017  Antimicrobials:   IV Rocephin 05/22/2017   Subjective: Patient in bed.  States shortness of breath has improved from yesterday.  Had a bowel movement last night.  Passing gas.  Denies any abdominal pain.  Asking for liquids or something to eat and drink.  Patient feels wheezing has improved from yesterday.   Objective: Vitals:   05/28/17 0500 05/28/17 0600 05/28/17 0634 05/28/17 0800  BP:   (!) 189/59 (!) 173/51  Pulse: 69 69 70 71  Resp: (!) 25 (!) 21 18 (!) 30  Temp:    97.9 F (36.6 C)  TempSrc:    Oral  SpO2: 100% 99% 97%   Weight: 109.3 kg (240 lb 15.4 oz)     Height:        Intake/Output Summary (Last 24 hours) at 05/28/2017 0914 Last data filed at 05/28/2017 1779 Gross per 24 hour  Intake 300 ml  Output 4575 ml  Net -4275 ml   Filed Weights   05/26/17 0200 05/27/17 0500 05/28/17 0500  Weight: 109.4 kg (241 lb 2.9 oz) 110 kg (242 lb 8.1 oz) 109.3 kg (240 lb 15.4 oz)    Examination:  General exam: Alert.   Respiratory system: Fair air movement.  No wheezing.  No rhonchi.  Respiratory effort normal. Cardiovascular system: RRR no murmurs rubs or gallops.  No JVD.  1+ bilateral lower extremity edema.   Gastrointestinal system: Abdomen is mildly distended, softer, positive bowel sounds.  No rebound.  No guarding.  Central nervous system: Alert and oriented.  No focal neurological  deficits. Extremities: Symmetric 5 x 5 power. Skin: No rashes, lesions or ulcers Psychiatry: Judgement and insight appear normal. Mood & affect appropriate.     Data Reviewed: I have personally reviewed following labs and imaging studies  CBC: Recent Labs  Lab 05/24/17 0307 05/25/17 0319 05/26/17 0312 05/27/17 0318 05/28/17 0323  WBC 3.7* 6.6 7.1 6.8 5.8  HGB 10.6* 9.5* 9.5* 9.5* 9.6*  HCT 31.9* 29.6* 30.5* 30.6* 30.7*  MCV 68.8* 68.0* 70.6* 70.5* 70.3*  PLT 164 162 182 159 390*   Basic Metabolic Panel: Recent Labs  Lab 05/24/17 0307  05/25/17 0319 05/26/17 0312 05/27/17 0318 05/28/17 0323  NA 142 140 143 136 135  K 4.0 3.4* 3.7 3.7 3.7  CL 105 101 104 99* 95*  CO2 23 24 23 26 28   GLUCOSE 157* 137* 123* 167* 123*  BUN 48* 32* 20 14 13   CREATININE 1.27* 1.11* 1.06* 0.96 1.03*  CALCIUM 8.2* 7.9* 8.1* 8.2* 8.3*  MG 1.4* 2.1 1.8 2.0  --   PHOS 3.5 3.4 2.7 3.0 2.6   GFR: Estimated Creatinine Clearance: 49.1 mL/min (A) (by C-G formula based on SCr of 1.03 mg/dL (H)). Liver Function Tests: Recent Labs  Lab 05/22/17 1704  05/24/17 0307 05/25/17 0319 05/26/17 0312 05/27/17 0318 05/28/17 0323  AST 25  --   --   --   --   --   --   ALT 25  --   --   --   --   --   --   ALKPHOS 158*  --   --   --   --   --   --   BILITOT 0.4  --   --   --   --   --   --   PROT 7.5  --   --   --   --   --   --   ALBUMIN 3.3*   < > 2.9* 2.6* 2.4* 2.6* 2.6*   < > = values in this interval not displayed.   Recent Labs  Lab 05/22/17 1704 05/23/17 0746  LIPASE 76* 45  AMYLASE 184*  --    No results for input(s): AMMONIA in the last 168 hours. Coagulation Profile: No results for input(s): INR, PROTIME in the last 168 hours. Cardiac Enzymes: No results for input(s): CKTOTAL, CKMB, CKMBINDEX, TROPONINI in the last 168 hours. BNP (last 3 results) No results for input(s): PROBNP in the last 8760 hours. HbA1C: No results for input(s): HGBA1C in the last 72 hours. CBG: Recent Labs    Lab 05/27/17 1538 05/27/17 1941 05/27/17 2313 05/28/17 0322 05/28/17 0752  GLUCAP 217* 169* 154* 119* 155*   Lipid Profile: No results for input(s): CHOL, HDL, LDLCALC, TRIG, CHOLHDL, LDLDIRECT in the last 72 hours. Thyroid Function Tests: No results for input(s): TSH, T4TOTAL, FREET4, T3FREE, THYROIDAB in the last 72 hours. Anemia Panel: No results for input(s): VITAMINB12, FOLATE, FERRITIN, TIBC, IRON, RETICCTPCT in the last 72 hours. Sepsis Labs: Recent Labs  Lab 05/23/17 0153 05/23/17 0525  LATICACIDVEN 0.8 0.6    Recent Results (from the past 240 hour(s))  MRSA PCR Screening     Status: None   Collection Time: 05/23/17  2:17 AM  Result Value Ref Range Status   MRSA by PCR NEGATIVE NEGATIVE Final    Comment:        The GeneXpert MRSA Assay (FDA approved for NASAL specimens only), is one component of a comprehensive MRSA colonization surveillance program. It is not intended to diagnose MRSA infection nor to guide or monitor treatment for MRSA infections. Performed at [facility], [place] [address], [place], [place] [address]   Culture, Urine     Status: Abnormal   Collection Time: 05/23/17  8:50 AM  Result Value Ref Range Status   Specimen Description   Final    URINE, CLEAN CATCH Performed at [facility], [place] [address] [place], [place] [address]    Special Requests   Final    NONE Performed at [facility], [address] [place], [place]  [address]    Culture (A)  Final    <10,000 COLONIES/mL INSIGNIFICANT GROWTH Performed at [place] [address], [place], [place] [address]    Report Status 05/24/2017 FINAL  Final         Radiology Studies: Dg Chest 2 View  Result Date: 05/27/2017 CLINICAL DATA:  Per order- wheezing PT HX: DM, GERD, HTN, pneumonia, non smoker EXAM: CHEST - 2 VIEW COMPARISON:  05/24/2017 FINDINGS: Low lung volumes. Some increase in bilateral interstitial edema  or infiltrates. Central pulmonary vascular congestion. Moderate cardiomegaly stable. No effusion. Visualized bones unremarkable. IMPRESSION: 1. Interval increase in bilateral interstitial edema or infiltrates. Electronically Signed   By: Lucrezia Europe M.D.   On: 05/27/2017 13:56   Dg Abd 2 Views  Result Date: 05/27/2017 CLINICAL DATA:  Wheezing.  Abdominal distension EXAM: ABDOMEN - 2 VIEW COMPARISON:  CT 05/24/2017, radiograph 05/26/2017, FINDINGS: RIGHT-sided down radiograph small amount gas over the margin of the liver which is favored to represent extension of the RIGHT lung base/diaphragmatic sulcus. Gas-filled loops of colon are similar to comparison exam. There is a progression of the oral contrast. Multiple diverticular noted throughout the colon. IMPRESSION: 1. No evidence of bowel obstruction. 2. Multiple colonic diverticula. 3. No clear evidence of intraperitoneal free air. Electronically Signed   By: Suzy Bouchard M.D.   On: 05/27/2017 13:47        Scheduled Meds: . allopurinol  300 mg Oral Daily  . clotrimazole  1 Applicatorful Vaginal QHS  . docusate sodium  100 mg Oral Q12H  . furosemide  40 mg Intravenous Q12H  . Gerhardt's butt cream   Topical TID  . insulin aspart  0-15 Units Subcutaneous Q4H  . iron polysaccharides  150 mg Oral BID  . mouth rinse  15 mL Mouth Rinse BID  . metoprolol tartrate  10 mg Intravenous Q8H  . pantoprazole  40 mg Oral Q1200  . ramelteon  8 mg Oral QHS   Continuous Infusions: . cefTRIAXone (ROCEPHIN)  IV Stopped (05/27/17 2210)  . fluconazole (DIFLUCAN) IV Stopped (05/27/17 2112)     LOS: 6 days    Time spent: 35 minutes    Irine Seal, MD [facility] Pager [phone]  If 7PM-7AM, please contact night-coverage [email or web address] Password [facility] 05/28/2017, 9:14 AM

## 2017-05-28 NOTE — Progress Notes (Signed)
Report was given to the nurse that is assiggned to room 1331.patient will be transported shortly

## 2017-05-28 NOTE — Care Management Note (Signed)
Case Management Note  Patient Details  Name: Lauren Crosby MRN: [id number] Date of Birth: 03-09-1931  Subjective/Objective:                  hhc RN,OT,HHA,SW(PT DOES NOT WANT)  Action/Plan: [facility] notifiied  Expected Discharge Date:                  Expected Discharge Plan:  [place]  In-House Referral:     Discharge planning Services  CM Consult  Post Acute Care Choice:    Choice offered to:  Patient  DME Arranged:    DME Agency:     HH Arranged:  RN, OT, Nurse's Aide [place] Agency:  [facility]  Status of Service:  In process, will continue to follow  If discussed at Long Length of Stay Meetings, dates discussed:    Additional Comments:  Leeroy Cha, RN 05/28/2017, 11:46 AM

## 2017-05-28 NOTE — Progress Notes (Addendum)
Progress Note   Subjective  Chief Complaint: UTI, hydronephrosis, acute on chronic anemia, colonic ileus  Today, patient is asleep at time of exam, but awakens easily with verbal stimulus.  Describes passing gas and having a bowel movement yesterday which was quite large.  This is confirmed with nursing.  Describes that her abdomen is still distended but much less tense and tender than yesterday.  She would like to try eating.    Objective   Vital signs in last 24 hours: Temp:  [97.9 F (36.6 C)-99.4 F (37.4 C)] 97.9 F (36.6 C) (05/20 0800) Pulse Rate:  [68-82] 71 (05/20 0800) Resp:  [14-30] 30 (05/20 0800) BP: (148-189)/(39-131) 173/51 (05/20 0800) SpO2:  [94 %-100 %] 97 % (05/20 0634) Weight:  [240 lb 15.4 oz (109.3 kg)] 240 lb 15.4 oz (109.3 kg) (05/20 0500) Last BM Date: 05/26/17 General:    Obese Caucasian female in NAD Heart:  Regular rate and rhythm; no murmurs Lungs: Respirations even and unlabored, lungs CTA bilaterally Abdomen:  Soft, nontender and moderate distention.  +BS Psych:  Cooperative. Normal mood and affect.  Intake/Output from previous day: 05/19 0701 - 05/20 0700 In: 300 [IV Piggyback:300] Out: 4575 [Urine:4575]  Lab Results: Recent Labs    05/26/17 0312 05/27/17 0318 05/28/17 0323  WBC 7.1 6.8 5.8  HGB 9.5* 9.5* 9.6*  HCT 30.5* 30.6* 30.7*  PLT 182 159 140*   BMET Recent Labs    05/26/17 0312 05/27/17 0318 05/28/17 0323  NA 143 136 135  K 3.7 3.7 3.7  CL 104 99* 95*  CO2 23 26 28   GLUCOSE 123* 167* 123*  BUN 20 14 13   CREATININE 1.06* 0.96 1.03*  CALCIUM 8.1* 8.2* 8.3*   LFT Recent Labs    05/28/17 0323  ALBUMIN 2.6*   Studies/Results: Dg Chest 2 View  Result Date: 05/27/2017 CLINICAL DATA:  Per order- wheezing PT HX: DM, GERD, HTN, pneumonia, non smoker EXAM: CHEST - 2 VIEW COMPARISON:  05/24/2017 FINDINGS: Low lung volumes. Some increase in bilateral interstitial edema or infiltrates. Central pulmonary vascular  congestion. Moderate cardiomegaly stable. No effusion. Visualized bones unremarkable. IMPRESSION: 1. Interval increase in bilateral interstitial edema or infiltrates. Electronically Signed   By: Lucrezia Europe M.D.   On: 05/27/2017 13:56   Dg Abd 2 Views  Result Date: 05/27/2017 CLINICAL DATA:  Wheezing.  Abdominal distension EXAM: ABDOMEN - 2 VIEW COMPARISON:  CT 05/24/2017, radiograph 05/26/2017, FINDINGS: RIGHT-sided down radiograph small amount gas over the margin of the liver which is favored to represent extension of the RIGHT lung base/diaphragmatic sulcus. Gas-filled loops of colon are similar to comparison exam. There is a progression of the oral contrast. Multiple diverticular noted throughout the colon. IMPRESSION: 1. No evidence of bowel obstruction. 2. Multiple colonic diverticula. 3. No clear evidence of intraperitoneal free air. Electronically Signed   By: Suzy Bouchard M.D.   On: 05/27/2017 13:47    Assessment / Plan:   Assessment: 1.  Acute on chronic anemia: Hemoglobin baseline 10, admit 7.3, currently 9.6, history of thalassemia minor and chronic anemia, profound microcytosis, no colonoscopy or EGD in >10 years, not sure she should have one now given her frailty and multiple comorbid issues, there has been no overt bleeding during hospitalization 2.  Colonic ileus: By imaging and clinically, had been improving until yesterday when she had increasing abdominal distention, KUB ordered, did not show acute ileus, patient was n.p.o. yesterday, recommend continued mobilization  Plan: 1.  Okay to restart  clears today with observation for any increasing nausea, vomiting, abdominal distention or pain 2.  Continue other supportive measures 3.  Continue to mobilize her to help with bowel movements 4.  Please await any further recommendations from Dr. Lyndel Safe later today  Thank you for your kind consultation, we will continue to follow along.   LOS: 6 days   Levin Erp  05/28/2017,  9:44 AM  Pager # [phone]   Attending physician's note   I have taken an interval history, reviewed the chart and examined the patient. I agree with the Advanced Practitioner's note, impression and recommendations.   Pt doing well. Ileus resolved. Having good BMs. At risk for recurrent ileus. High redundant colon. Suggest: ambulate, agree with home PT, minimize pain meds including NSAIDs, keep K>4 and Mg>2. Can FU in GI clinic as out pt. Doesnot want colonoscopy at this time. As out pt, can again discuss that. Will sign off for now. Pl call if with any questions.   Carmell Austria, MD

## 2017-05-29 LAB — GLUCOSE, CAPILLARY
GLUCOSE-CAPILLARY: 210 mg/dL — AB (ref 65–99)
GLUCOSE-CAPILLARY: 144 mg/dL — AB (ref 65–99)
GLUCOSE-CAPILLARY: 237 mg/dL — AB (ref 65–99)
Glucose-Capillary: 167 mg/dL — ABNORMAL HIGH (ref 65–99)
Glucose-Capillary: 153 mg/dL — ABNORMAL HIGH (ref 65–99)
Glucose-Capillary: 284 mg/dL — ABNORMAL HIGH (ref 65–99)
Glucose-Capillary: 236 mg/dL — ABNORMAL HIGH (ref 65–99)

## 2017-05-29 LAB — CBC
HCT: 30.1 % — ABNORMAL LOW (ref 36.0–46.0)
Hemoglobin: 9.5 g/dL — ABNORMAL LOW (ref 12.0–15.0)
MCH: 21.8 pg — ABNORMAL LOW (ref 26.0–34.0)
MCHC: 31.6 g/dL (ref 30.0–36.0)
MCV: 69.2 fL — ABNORMAL LOW (ref 78.0–100.0)
Platelets: 149 10*3/uL — ABNORMAL LOW (ref 150–400)
RBC: 4.35 MIL/uL (ref 3.87–5.11)
RDW: 21.5 % — AB (ref 11.5–15.5)
WBC: 5.4 10*3/uL (ref 4.0–10.5)

## 2017-05-29 LAB — RENAL FUNCTION PANEL
ALBUMIN: 2.6 g/dL — AB (ref 3.5–5.0)
Anion gap: 12 (ref 5–15)
BUN: 12 mg/dL (ref 6–20)
CO2: 29 mmol/L (ref 22–32)
CREATININE: 1.03 mg/dL — AB (ref 0.44–1.00)
Calcium: 8.5 mg/dL — ABNORMAL LOW (ref 8.9–10.3)
Chloride: 93 mmol/L — ABNORMAL LOW (ref 101–111)
GFR calc Af Amer: 57 mL/min — ABNORMAL LOW (ref >60)
GFR, EST NON AFRICAN AMERICAN: 49 mL/min — AB (ref >60)
Glucose, Bld: 151 mg/dL — ABNORMAL HIGH (ref 65–99)
PHOSPHORUS: 2.9 mg/dL (ref 2.5–4.6)
Potassium: 3.4 mmol/L — ABNORMAL LOW (ref 3.5–5.1)
Sodium: 134 mmol/L — ABNORMAL LOW (ref 135–145)

## 2017-05-29 MED ORDER — POTASSIUM CHLORIDE CRYS ER 20 MEQ PO TBCR
40.0000 meq | EXTENDED_RELEASE_TABLET | Freq: Once | ORAL | Status: AC
  Administered 2017-05-29: 40 meq via ORAL
  Filled 2017-05-29: qty 2

## 2017-05-29 MED ORDER — INSULIN ASPART 100 UNIT/ML ~~LOC~~ SOLN
0.0000 [IU] | Freq: Three times a day (TID) | SUBCUTANEOUS | Status: DC
Start: 2017-05-29 — End: 2017-06-01
  Administered 2017-05-29: 5 [IU] via SUBCUTANEOUS
  Administered 2017-05-30: 11 [IU] via SUBCUTANEOUS
  Administered 2017-05-30: 5 [IU] via SUBCUTANEOUS
  Administered 2017-05-30: 11 [IU] via SUBCUTANEOUS
  Administered 2017-05-30: 8 [IU] via SUBCUTANEOUS
  Administered 2017-05-31: 5 [IU] via SUBCUTANEOUS
  Administered 2017-05-31 (×2): 8 [IU] via SUBCUTANEOUS
  Administered 2017-05-31 – 2017-06-01 (×2): 11 [IU] via SUBCUTANEOUS
  Administered 2017-06-01: 8 [IU] via SUBCUTANEOUS

## 2017-05-29 MED ORDER — METOPROLOL TARTRATE 25 MG PO TABS
25.0000 mg | ORAL_TABLET | Freq: Two times a day (BID) | ORAL | Status: DC
  Administered 2017-05-29 – 2017-06-01 (×6): 25 mg via ORAL
  Filled 2017-05-29 (×6): qty 1

## 2017-05-29 NOTE — Progress Notes (Signed)
Inpatient Diabetes Program Recommendations  AACE/ADA: New Consensus Statement on Inpatient Glycemic Control (2015)  Target Ranges:  Prepandial:   less than 140 mg/dL      Peak postprandial:   less than 180 mg/dL (1-2 hours)      Critically ill patients:  140 - 180 mg/dL   Results for Lauren Crosby, Lauren Crosby (MRN [id number]) as of 05/29/2017 08:05  Ref. Range 05/27/2017 23:13 05/28/2017 03:22 05/28/2017 07:52 05/28/2017 13:23 05/28/2017 15:55 05/28/2017 22:57  Glucose-Capillary Latest Ref Range: 65 - 99 mg/dL 154 (H) 119 (H) 155 (H) 210 (H) 312 (H) 207 (H)   Results for Lauren Crosby, Lauren Crosby (MRN [id number]) as of 05/29/2017 08:05  Ref. Range 05/29/2017 01:45 05/29/2017 05:36  Glucose-Capillary Latest Ref Range: 65 - 99 mg/dL 167 (H) 144 (H)     Home DM Meds: 70/30 Insulin 45 units AM/ 50 units PM        Metformin 1000 mg BID   Current Orders: Novolog Moderate Correction Scale/ SSI (0-15 units) Q4 hours      Started on CL diet yesterday.  No PO documentation yet.  Checking CBGs Q4 hours at present.  CBGs elevated yesterday afternoon.  Per records, patient takes 70/30 Insulin at home.    Do not recommend starting 70/30 Insulin yet as patient only taking CL diet.  Needs to be eating well to resume 70/30 Insulin.     --Will follow patient during hospitalization--  Wyn Quaker RN, MSN, CDE Diabetes Coordinator Inpatient Glycemic Control Team Team Pager: [phone] (8a-5p)

## 2017-05-29 NOTE — Progress Notes (Signed)
Physical Therapy Treatment Patient Details Name: Lauren Crosby MRN: [id number] DOB: 05-08-1931 Today's Date: 05/29/2017    History of Present Illness Pt admitted through ED with dx of ARF, hyperkalemia, and anemia.  Pt with history of DM with retinopathy, nephropathy and Neuropathy.   Pt also with chronic LE edema and bilat knee and back pain limiting mobility at home.    PT Comments    Assisted pt OOB to [facility] and attempt amb however limited.  General Gait Details: pt managed several short shuffling steps at bedside but unable to continue 2* c/o bilt knee and back pain as well as c/o fatigue  "I can't stand very long"  Pt emotional.  Pt stated, prior she was able to amb within her home approx 20 fee.  Pt was unable to progress her gait distance past 2 feet due to increased WOB but RA was 95%.   Positioned in recliner and did NOT reapply PeriWick as pt needs to move more and get up to [place].    Follow Up Recommendations  SNF     Equipment Recommendations  None recommended by PT    Recommendations for Other Services       Precautions / Restrictions Precautions Precautions: Fall Restrictions Weight Bearing Restrictions: No    Mobility  Bed Mobility Overal bed mobility: Needs Assistance Bed Mobility: Supine to Sit     Supine to sit: Mod assist;Min assist     General bed mobility comments: Pt initiating movement sit<>supine but requiring physical assist to manage bilat LEs and bring trunk to upright as well as to complete transition to EOB sitting  Transfers Overall transfer level: Needs assistance Equipment used: Rolling walker (2 wheeled);None Transfers: Sit to/from [employer] to Stand: Min assist;+2 physical assistance;+2 safety/equipment;From elevated surface         General transfer comment: assist to bring wt up and fwd and to balance in initial standing with RW    assisted from elevated bed to BCS and off BSC to walker to amb    Ambulation/Gait Ambulation/Gait assistance: Min assist;+2 physical assistance;+2 safety/equipment Ambulation Distance (Feet): 2 Feet Assistive device: Rolling walker (2 wheeled) Gait Pattern/deviations: Step-to pattern;Decreased step length - right;Decreased step length - left Gait velocity: decreased    General Gait Details: pt managed several short shuffling steps at bedside but unable to continue 2* c/o bilt knee and back pain as well as c/o fatigue  "I can't stand very long"  Pt emotional   Stairs             Wheelchair Mobility    Modified Rankin (Stroke Patients Only)       Balance                                            Cognition Arousal/Alertness: Awake/alert Behavior During Therapy: WFL for tasks assessed/performed                                   General Comments: pt wimpering, worried       Exercises      General Comments        Pertinent Vitals/Pain Pain Assessment: Faces Faces Pain Scale: Hurts even more Pain Location: "all over" , back, B knees, lower ABD   pt emotional, crying Pain Descriptors / Indicators:  Grimacing;Moaning;Sore;Aching;Crying Pain Intervention(s): Monitored during session    Home Living                      Prior Function            PT Goals (current goals can now be found in the care plan section) Progress towards PT goals: Progressing toward goals    Frequency    Min 3X/week      PT Plan Current plan remains appropriate    Co-evaluation              AM-PAC PT "6 Clicks" Daily Activity  Outcome Measure  Difficulty turning over in bed (including adjusting bedclothes, sheets and blankets)?: Unable Difficulty moving from lying on back to sitting on the side of the bed? : Unable Difficulty sitting down on and standing up from a chair with arms (e.g., wheelchair, bedside commode, etc,.)?: Unable Help needed moving to and from a bed to chair (including a  wheelchair)?: A Lot Help needed walking in hospital room?: A Lot Help needed climbing 3-5 steps with a railing? : Total 6 Click Score: 8    End of Session Equipment Utilized During Treatment: Gait belt Activity Tolerance: Patient limited by fatigue;Patient limited by pain Patient left: in chair;with call bell/phone within reach;with chair alarm set Nurse Communication: (did NOT reinsert Elsie Saas as pt needs to move more and use BSC) PT Visit Diagnosis: Difficulty in walking, not elsewhere classified (R26.2)     Time: 9833-8250 PT Time Calculation (min) (ACUTE ONLY): 25 min  Charges:  $Gait Training: 8-22 mins $Therapeutic Activity: 8-22 mins                    G Codes:       {Ayesha Markwell  PTA [facility]  Acute  Rehab Pager      [phone]

## 2017-05-29 NOTE — Care Management Important Message (Signed)
Important Message  Patient Details  Name: JEANIA NATER MRN: [id number] Date of Birth: Feb 16, 1931   Medicare Important Message Given:  Yes    Kerin Salen 05/29/2017, 11:30 AMImportant Message  Patient Details  Name: DIKSHA TAGLIAFERRO MRN: [id number] Date of Birth: 05/17/1931   Medicare Important Message Given:  Yes    Kerin Salen 05/29/2017, 11:30 AM

## 2017-05-29 NOTE — Progress Notes (Signed)
Occupational Therapy Treatment Patient Details Name: LEVANA MINETTI MRN: [id number] DOB: 1931-12-04 Today's Date: 05/29/2017    History of present illness Pt admitted through ED with dx of ARF, hyperkalemia, and anemia.  Pt with history of DM with retinopathy, nephropathy and Neuropathy.   Pt also with chronic LE edema and bilat knee and back pain limiting mobility at home.   OT comments  Pt was incontinent x urine. Never called for assistance nor told this therapist she was wet.  She plans home with husband assisting her. Needs mod A for legs for back to bed. Pt states that foley wasn't supposed to come out and that MD is deciding whether to place back in    Follow Up Recommendations  SNF    Equipment Recommendations  3 in 1 bedside commode    Recommendations for Other Services      Precautions / Restrictions Precautions Precautions: Fall Restrictions Weight Bearing Restrictions: No       Mobility Bed Mobility           Sit to supine: Mod assist   General bed mobility comments: for legs  Transfers   Equipment used: Rolling walker (2 wheeled);None   Sit to Stand: Min assist         General transfer comment: assist to rise and steady    Balance                                           ADL either performed or assessed with clinical judgement   ADL                           Toilet Transfer: Minimal assistance;Stand-pivot;RW(chair to bed)   Toileting- Clothing Manipulation and Hygiene: Total assistance;Sit to/from stand         General ADL Comments: pt attempted to wipe but could not reach either front or back     Vision       Perception     Praxis      Cognition Arousal/Alertness: Awake/alert Behavior During Therapy: [facility] for tasks assessed/performed                                   General Comments: pt urinated twice while in chair (per her). Never called for assistance and did not tell  therapist she needed to be cleaned up prior to transfer home        Exercises     Shoulder Instructions       General Comments encouraged pt to walk around bed to get back in. She did not want to.  She plans to go home and states that husband can help her    Pertinent Vitals/ Pain       Pain Assessment: Faces Faces Pain Scale: Hurts even more Pain Location: "all over" , back, B knees, lower ABD   pt emotional, crying Pain Descriptors / Indicators: Grimacing;Moaning;Sore;Aching;Crying Pain Intervention(s): Limited activity within patient's tolerance;Monitored during session  Home Living                                          Prior Functioning/Environment  Frequency  Min 2X/week        Progress Toward Goals  OT Goals(current goals can now be found in the care plan section)  Progress towards OT goals: Progressing toward goals     Plan      Co-evaluation                 AM-PAC PT "6 Clicks" Daily Activity     Outcome Measure   Help from another person eating meals?: A Little Help from another person taking care of personal grooming?: A Little Help from another person toileting, which includes using toliet, bedpan, or urinal?: A Lot Help from another person bathing (including washing, rinsing, drying)?: A Lot Help from another person to put on and taking off regular upper body clothing?: A Lot Help from another person to put on and taking off regular lower body clothing?: A Lot 6 Click Score: 14    End of Session    OT Visit Diagnosis: Unsteadiness on feet (R26.81);Muscle weakness (generalized) (M62.81)   Activity Tolerance Patient limited by fatigue;Patient limited by pain   Patient Left in bed;with call bell/phone within reach   Nurse Communication          Time: 9758-8325 OT Time Calculation (min): 22 min  Charges: OT General Charges $OT Visit: 1 Visit OT Treatments $Self Care/Home Management : 8-22  mins  [name], OTR/L [phone] 05/29/2017   Shaylynne Lunt 05/29/2017, 4:17 PM

## 2017-05-29 NOTE — Progress Notes (Signed)
PROGRESS NOTE    Lauren Crosby  WCB:[id number] DOB: [birth year] DOA: 05/22/2017 PCP: [name], MD    Brief Narrative:  Lauren Crosby is a 84 y.o. female with medical history significant of DM2, HTN, nephrolithiasis x7 times (last 6-7 years ago), diverticulitis.  Patient has had progressive abd pain for 1 week with poor PO intake.  Abd pain is diffuse, no V/D.  No fevers nor chills.  No dysuria.  Patient went to PCPs office today, sent in to ED for abnormal labs.   ED Course: Cr 2.35 (up from baseline 1.1), bicarb 15, K 6.8.  [place] was negative for hydronephrosis.  Dr. Jimmy Footman consulted.  CT abd/pelvis actually came back positive for L hydronephrosis.  CT also has a slow growing R infrarenal mass slow enlargement since 2011, also shows a 3.4cm L adrenal adenoma which is known based on PMHx (confident that CT scan is on the correct patient).  Urology and nephrology consulted and following.  Catheter placed.  Renal function improved.  Patient subsequently developed worsening abdominal distention and pain abdominal films and CT abdomen and pelvis consistent with colonic ileus.  GI consulted for colonic ileus and anemia.    Assessment & Plan:   Principal Problem:   ARF (acute renal failure) (HCC) Active Problems:   [facility]): Colonic   Abdominal pain   HTN (hypertension)   GERD   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 (HCC)   Hypertension   Acute kidney failure (HCC)   Metabolic acidosis, normal anion gap (NAG)   Hyperkalemia, diminished renal excretion   Acute lower UTI   Iron deficiency anemia   Pressure injury of buttock, stage 2   Acute on chronic anemia   Hyperkalemia   Hydronephrosis   Vaginal candidiasis   Dermatitis associated with moisture   Pyelitis: Mild per CT 05/24/2017   Acidemia  #1 acute renal failure (baseline creatinine 1.1-1.4) Likely secondary to hypoperfusion in the setting of poor oral intake and ACE inhibitor and  hydronephrosis.  Patient noted to be hyperkalemic with a acidosis as well on admission which have since improved.  CT abdomen and pelvis consistent with left hydronephrosis with questionable etiology/obstructive uropathy.  Foley catheter was  placed however has been removed per nursing protocol.  Patient with a urine output of 4.575 L from 05/27/2017 to 05/28/2017. Creatinine currently at 1.03 from 1.03 from 0.96 from 1.06 from 1.11 from 1.27 from 1.75 from 2.35 on admission.  Bicarb drip has been discontinued per nephrology.  Will avoid ACE inhibitor/ARB and NSAIDs with recent acute renal failure and hyperkalemia per nephrology recommendations.   2.  Left hydronephrosis Questionable etiology.  Patient assessed by urology who feel could be secondary to pyelonephritis versus obstructive uropathy secondary to diabetic cytopathy versus bladder cancer recurrence.  Foley catheter in place.  Foley catheter removed yesterday by RN Foley protocol.  Urine output has not been assessed.  Per RN patient with wet chucks. Creatinine improved and currently at 1.03..  Urinalysis with large leukocytes, negative nitrite, greater than 50 WBCs.  Urine cultures with less than 10,000 colonies however urinalysis significantly consistent with UTI.  Patient also noted to have mild pyelitis on CT abdomen and pelvis on 05/24/2017.  Continue empiric IV Rocephin for 7-10 days to complete course of antibiotic treatment.  Patient was supposed to be discharged with a Foley catheter with outpatient follow-up with urology.  Last urology note.  Foley catheter was removed by nursing Foley catheter protocol.  Case discussed with urology and  will check a post void I and O cath and if greater than 200 cc on catheterization will place Foley catheter back in and patient will need to follow-up with urology in the outpatient setting.    3 Colonic ileus Patient complained of significant abdominal pain the morning of 05/24/2017 with increased abdominal  distention, tightness.  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  Colonic diameter was 8.2 cm from CT abdomen and pelvis 05/24/2017.  Patient with no emesis.  Patient unable to tolerate NG tube on 05/24/2017.  Rectal tube was placed subsequently been discontinued by gastroenterology.  Repeat abdominal films 05/26/2017 with improvement.  Patient was started on clears which she tolerated.  Due to worsening abdominal distention and hypoactive bowel sounds on 05/27/2017 patient was made n.p.o.  Clinical improvement in terms of abdominal distention and decreased bowel sounds.  Patient with bowel movement and passing flatus.  Patient was started on clear liquids which he tolerated.  Will advance diet as tolerated to a full liquid diet and subsequently a soft diet.  Keep potassium greater than 4.  Keep magnesium greater than 2.  Continue frequent turning 2 hours on the left, 2 hours on the right, out of bed to chair and mobilize as tolerated.  Frequent turning.  Per GI.    4.  UTI/pyelitis Urinalysis consistent with a urinary tract infection.  Per GI likely etiology of colonic ileus and abdominal pain.  Urine cultures with less than 10,000 colonies.  Urinalysis however turbid, large leukocytes, greater than 50 WBCs, WBC clumps present, many bacteria.  CT abdomen and pelvis showing a mild pyelitis.  Patient had also presented with hydronephrosis.  Continue empiric IV Rocephin and treat for total of 7 to 10 days.  Likely transition to oral antibiotics on discharge to complete course of antibiotics..  5.  Acute on chronic microcytic anemia/iron deficiency anemia/history of thalassemia Hemoglobin was at 7.3 on admission from 9.9 January 12, 2017.  Hemoglobin this morning is stable at 9.5 from 9.6 from 9.5 from 9.5 from 10.6 from 9.8 from 6.8 after transfusion of 3 units of packed red blood cells on 05/23/2017.Marland Kitchen  Patient with no overt bleeding noted.  Given a dose  of IV Feraheme on 05/26/2017.  When back on a diet will resume oral iron supplementation.  Continue PPI.   Patient with prior history of gastritis per endoscopy of 2005 and history of colonic polyps per colonoscopy of 2008.  Patient seen in consultation by GI and patient was scheduled for EGD and colonoscopy however due to Ogilvie's syndrome/colonic ileus EGD/colonoscopy has been canceled for now and will need to be done in the outpatient setting.  GI following.  Follow H&H and transfuse for hemoglobin less than 7.   6.  MASD Continue current wound care.  7.  Hyperkalemia Secondary to problem #1.  Resolved on Kayexalate.  Potassium currently at 3.4.  Give a dose of K-Dur p.o. x1.  Patient noted with resolving colonic ileus.  Patient also on IV diuretics.  Follow.    8.  Hypertension ACE inhibitor on hold will likely not resume for at least 8 weeks due to problem #1.  Blood pressure elevated overnight and this morning in the 170s.  Transition IV Lopressor to oral Lopressor.  Continue IV Lasix and transition to oral Lasix tomorrow.  Follow.     9.  Well controlled diabetes mellitus type 2 Hemoglobin A1c was 6.6 on 06/13/2016.  Hemoglobin A1c is 6.7.  CBG  this morning is 144.  Patient on clear liquids will advance to a full liquid diet.  No need to resume some basal insulin or have home dose of 70/30.  Sliding scale insulin.  Follow.  10 acidosis Secondary to problem #1.  Resolved on a bicarb drip.   11 gastroesophageal reflux disease PPI dose adjusted per gastroenterology to 40 mg daily which patient will be discharged home on.   12 probable vaginal candidiasis Continue IV Diflucan and clotrimazole cream.   13 obesity  14 chronic lower extremity edema secondary to chronic venous stasis Stable.  Minimal expiratory wheezing improved after patient placed on increased dose of IV Lasix.  Oral Lasix were held on admission due to acute renal failure.  Continue current dose of IV Lasix and likely  transition to home dose oral Lasix in the next 24 hours.  Continue strict I's and O's.  Daily weights.   15.  Volume overload Patient with concern for volume overload. Patient with some minimal expiratory wheezing on 05/27/2017.  Patient also noted with some lower extremity edema which has improved with diuresis..  Wheezing has improved with diuresis.  Current weight is 240 pounds from 240 pounds from 242 pounds on 05/27/2017 from 237 pounds on admission.  Patient had Foley catheter removed per nursing protocol and as such urine output is not accurate at this time.  Continue current dose of IV Lasix and likely transition to home dose oral Lasix tomorrow.  Strict I's and O's.  Daily weights.    DVT prophylaxis: SCDs Code Status: Full Family Communication: Updated patient.  No family at bedside. Disposition Plan: Likely home with home health once clinically improved and okay with GI.    Consultants:   Nephrology: Dr.Deterding 05/22/2017  Urology: Dr.Narang 05/22/2017  Gastroenterology: Dr. Ardis Hughs 05/23/2017  Procedures:   CT abdomen and pelvis 05/22/2017, 05/24/2017  Acute abdominal series 05/22/2017, 05/25/2017, 05/26/2017, 05/27/2017  Renal ultrasound 05/22/2017  1 unit of packed red blood cells 05/22/2017  2 Units packed red blood cells pending 4 05/23/2017  Antimicrobials:   IV Rocephin 05/22/2017   Subjective: Patient seen sitting up in chair.  States shortness of breath has improved and currently close to baseline.  Passing flatus.  Passing gas.  Asking for food.  Denies significant abdominal pain.    Objective: Vitals:   05/28/17 2121 05/29/17 0539 05/29/17 1000 05/29/17 1508  BP: (!) 139/59 (!) 155/52  (!) 147/67  Pulse: 73 76  73  Resp: 19 18  19   Temp: 97.9 F (36.6 C) 98.4 F (36.9 C)  98.3 F (36.8 C)  TempSrc: Oral Oral  Oral  SpO2: 93% 95%  97%  Weight:   108.9 kg (240 lb)   Height:        Intake/Output Summary (Last 24 hours) at 05/29/2017 1656 Last data filed  at 05/29/2017 1500 Gross per 24 hour  Intake 480 ml  Output 500 ml  Net -20 ml   Filed Weights   05/27/17 0500 05/28/17 0500 05/29/17 1000  Weight: 110 kg (242 lb 8.1 oz) 109.3 kg (240 lb 15.4 oz) 108.9 kg (240 lb)    Examination:  General exam: Alert.   Respiratory system: Lungs clear to auscultation bilaterally.  No wheezes, no crackles, no rhonchi.  Normal respiratory effort.   Cardiovascular system: Regular rate and rhythm no murmurs rubs or gallops.  No JVD.  Trace bilateral lower extremity edema. Gastrointestinal system: Abdomen is mildly distended, soft, positive bowel sounds, minimal to mild tenderness to palpation,  no rebound.  No guarding.  Central nervous system: Alert and oriented.  No focal neurological deficits. Extremities: Symmetric 5 x 5 power. Skin: No rashes, lesions or ulcers Psychiatry: Judgement and insight appear normal. Mood & affect appropriate.     Data Reviewed: I have personally reviewed following labs and imaging studies  CBC: Recent Labs  Lab 05/25/17 0319 05/26/17 0312 05/27/17 0318 05/28/17 0323 05/29/17 0343  WBC 6.6 7.1 6.8 5.8 5.4  HGB 9.5* 9.5* 9.5* 9.6* 9.5*  HCT 29.6* 30.5* 30.6* 30.7* 30.1*  MCV 68.0* 70.6* 70.5* 70.3* 69.2*  PLT 162 182 159 140* 606*   Basic Metabolic Panel: Recent Labs  Lab 05/24/17 0307 05/25/17 0319 05/26/17 0312 05/27/17 0318 05/28/17 0323 05/29/17 0343  NA 142 140 143 136 135 134*  K 4.0 3.4* 3.7 3.7 3.7 3.4*  CL 105 101 104 99* 95* 93*  CO2 23 24 23 26 28 29   GLUCOSE 157* 137* 123* 167* 123* 151*  BUN 48* 32* 20 14 13 12   CREATININE 1.27* 1.11* 1.06* 0.96 1.03* 1.03*  CALCIUM 8.2* 7.9* 8.1* 8.2* 8.3* 8.5*  MG 1.4* 2.1 1.8 2.0  --   --   PHOS 3.5 3.4 2.7 3.0 2.6 2.9   GFR: Estimated Creatinine Clearance: 49 mL/min (A) (by C-G formula based on SCr of 1.03 mg/dL (H)). Liver Function Tests: Recent Labs  Lab 05/22/17 1704  05/25/17 0319 05/26/17 3016 05/27/17 0318 05/28/17 0323 05/29/17 0343   AST 25  --   --   --   --   --   --   ALT 25  --   --   --   --   --   --   ALKPHOS 158*  --   --   --   --   --   --   BILITOT 0.4  --   --   --   --   --   --   PROT 7.5  --   --   --   --   --   --   ALBUMIN 3.3*   < > 2.6* 2.4* 2.6* 2.6* 2.6*   < > = values in this interval not displayed.   Recent Labs  Lab 05/22/17 1704 05/23/17 0746  LIPASE 76* 45  AMYLASE 184*  --    No results for input(s): AMMONIA in the last 168 hours. Coagulation Profile: No results for input(s): INR, PROTIME in the last 168 hours. Cardiac Enzymes: No results for input(s): CKTOTAL, CKMB, CKMBINDEX, TROPONINI in the last 168 hours. BNP (last 3 results) No results for input(s): PROBNP in the last 8760 hours. HbA1C: No results for input(s): HGBA1C in the last 72 hours. CBG: Recent Labs  Lab 05/28/17 2257 05/29/17 0145 05/29/17 0536 05/29/17 1209 05/29/17 1629  GLUCAP 207* 167* 144* 284* 236*   Lipid Profile: No results for input(s): CHOL, HDL, LDLCALC, TRIG, CHOLHDL, LDLDIRECT in the last 72 hours. Thyroid Function Tests: No results for input(s): TSH, T4TOTAL, FREET4, T3FREE, THYROIDAB in the last 72 hours. Anemia Panel: No results for input(s): VITAMINB12, FOLATE, FERRITIN, TIBC, IRON, RETICCTPCT in the last 72 hours. Sepsis Labs: Recent Labs  Lab 05/23/17 0153 05/23/17 0525  LATICACIDVEN 0.8 0.6    Recent Results (from the past 240 hour(s))  MRSA PCR Screening     Status: None   Collection Time: 05/23/17  2:17 AM  Result Value Ref Range Status   MRSA by PCR NEGATIVE NEGATIVE Final    Comment:  The GeneXpert MRSA Assay (FDA approved for NASAL specimens only), is one component of a comprehensive MRSA colonization surveillance program. It is not intended to diagnose MRSA infection nor to guide or monitor treatment for MRSA infections. Performed at [facility], [place] [address], [place], [place] [address]   Culture, Urine     Status: Abnormal    Collection Time: 05/23/17  8:50 AM  Result Value Ref Range Status   Specimen Description   Final    URINE, CLEAN CATCH Performed at [facility], [place] [address] [place], [place] [address]    Special Requests   Final    NONE Performed at [facility], [place] [address], [place], [place] [address]    Culture (A)  Final    <10,000 COLONIES/mL INSIGNIFICANT GROWTH Performed at [place] [address] [place], [place] [address]    Report Status 05/24/2017 FINAL  Final         Radiology Studies: No results found.      Scheduled Meds: . allopurinol  300 mg Oral Daily  . clotrimazole  1 Applicatorful Vaginal QHS  . docusate sodium  100 mg Oral Q12H  . furosemide  40 mg Intravenous Q12H  . Gerhardt's butt cream   Topical TID  . insulin aspart  0-15 Units Subcutaneous TID WC & HS  . iron polysaccharides  150 mg Oral BID  . mouth rinse  15 mL Mouth Rinse BID  . metoprolol tartrate  10 mg Intravenous Q8H  . pantoprazole  40 mg Oral Q1200  . ramelteon  8 mg Oral QHS   Continuous Infusions: . cefTRIAXone (ROCEPHIN)  IV Stopped (05/29/17 0217)  . fluconazole (DIFLUCAN) IV Stopped (05/29/17 0217)     LOS: 7 days    Time spent: 35 minutes    Irine Seal, MD [facility] Pager [phone]  If 7PM-7AM, please contact night-coverage [email or web address] Password [facility] 05/29/2017, 4:56 PM

## 2017-05-30 LAB — RENAL FUNCTION PANEL
Albumin: 2.6 g/dL — ABNORMAL LOW (ref 3.5–5.0)
Anion gap: 13 (ref 5–15)
BUN: 15 mg/dL (ref 6–20)
CO2: 30 mmol/L (ref 22–32)
Calcium: 8.4 mg/dL — ABNORMAL LOW (ref 8.9–10.3)
Chloride: 93 mmol/L — ABNORMAL LOW (ref 101–111)
Creatinine, Ser: 1.2 mg/dL — ABNORMAL HIGH (ref 0.44–1.00)
GFR, EST AFRICAN AMERICAN: 47 mL/min — AB (ref >60)
GFR, EST NON AFRICAN AMERICAN: 41 mL/min — AB (ref >60)
Glucose, Bld: 203 mg/dL — ABNORMAL HIGH (ref 65–99)
POTASSIUM: 3.6 mmol/L (ref 3.5–5.1)
Phosphorus: 3.6 mg/dL (ref 2.5–4.6)
Sodium: 136 mmol/L (ref 135–145)

## 2017-05-30 LAB — GLUCOSE, CAPILLARY
GLUCOSE-CAPILLARY: 336 mg/dL — AB (ref 65–99)
GLUCOSE-CAPILLARY: 300 mg/dL — AB (ref 65–99)
Glucose-Capillary: 87 mg/dL (ref 65–99)
Glucose-Capillary: 131 mg/dL — ABNORMAL HIGH (ref 65–99)
Glucose-Capillary: 116 mg/dL — ABNORMAL HIGH (ref 65–99)
Glucose-Capillary: 204 mg/dL — ABNORMAL HIGH (ref 65–99)
Glucose-Capillary: 308 mg/dL — ABNORMAL HIGH (ref 65–99)

## 2017-05-30 MED ORDER — INSULIN GLARGINE 100 UNIT/ML ~~LOC~~ SOLN
10.0000 [IU] | Freq: Every day | SUBCUTANEOUS | Status: DC
  Administered 2017-05-30 – 2017-05-31 (×2): 10 [IU] via SUBCUTANEOUS
  Filled 2017-05-30 (×3): qty 0.1

## 2017-05-30 NOTE — Progress Notes (Signed)
Inpatient Diabetes Program Recommendations  AACE/ADA: New Consensus Statement on Inpatient Glycemic Control (2015)  Target Ranges:  Prepandial:   less than 140 mg/dL      Peak postprandial:   less than 180 mg/dL (1-2 hours)      Critically ill patients:  140 - 180 mg/dL   Results for Lauren Crosby, Lauren Crosby (MRN [id number]) as of 05/30/2017 11:00  Ref. Range 05/29/2017 08:40 05/29/2017 12:09 05/29/2017 16:29 05/29/2017 21:31  Glucose-Capillary Latest Ref Range: 65 - 99 mg/dL 153 (H) 284 (H) 236 (H) 237 (H)   Results for Lauren Crosby, Lauren Crosby (MRN [id number]) as of 05/30/2017 11:00  Ref. Range 05/30/2017 07:31  Glucose-Capillary Latest Ref Range: 65 - 99 mg/dL 204 (H)    Home DM Meds: 70/30 Insulin 45 units AM/ 50 units PM                              Metformin 1000 mg BID   Current Orders: Novolog Moderate Correction Scale/ SSI (0-15 units) TID AC + HS      Started on CL diet 05/20.   Progressed to Soft PO diet yesterday.  Per records, patient takes 70/30 Insulin at home.    Do not recommend starting 70/30 Insulin yet.  Needs to be eating well to resume 70/30 Insulin.    MD- May consider starting low dose basal insulin for this patient: Lantus 10 units daily (0.1 units/kg dosing based on weight of 109 kg)  Can resume 70/30 insulin at time of discharge     --Will follow patient during hospitalization--  Wyn Quaker RN, MSN, CDE Diabetes Coordinator Inpatient Glycemic Control Team Team Pager: [phone] (8a-5p)

## 2017-05-30 NOTE — Progress Notes (Signed)
Occupational Therapy Treatment Patient Details Name: Lauren Crosby MRN: [id number] DOB: [birth year] Today's Date: 05/30/2017    History of present illness Pt admitted through ED with dx of ARF, hyperkalemia, and anemia.  Pt with history of DM with retinopathy, nephropathy and Neuropathy.   Pt also with chronic LE edema and bilat knee and back pain limiting mobility at home.   OT comments  Pt stood x 2 and performed spt to 3:1 commode. Continues to need total A for hygiene and min A for SPT and sit to stand  Follow Up Recommendations  SNF(pt plans home; recommend HHOT if so)    Equipment Recommendations  3 in 1 bedside commode    Recommendations for Other Services      Precautions / Restrictions Precautions Precautions: Fall Restrictions Weight Bearing Restrictions: No       Mobility Bed Mobility               General bed mobility comments: oob  Transfers   Equipment used: Rolling walker (2 wheeled);None   Sit to Stand: Min assist         General transfer comment: assist to rise and steady    Balance                                           ADL either performed or assessed with clinical judgement   ADL                           Toilet Transfer: Minimal assistance;Stand-pivot;RW   Toileting- Clothing Manipulation and Hygiene: Total assistance;Sit to/from stand         General ADL Comments:  stood twice with min A.  Unable to assist with pericare.  Foley back in place     Vision       Perception     Praxis      Cognition Arousal/Alertness: Awake/alert Behavior During Therapy: WFL for tasks assessed/performed Overall Cognitive Status: Within Functional Limits for tasks assessed                                          Exercises     Shoulder Instructions       General Comments      Pertinent Vitals/ Pain       Pain Assessment: Faces Faces Pain Scale: Hurts even more Pain Location:  legs Pain Descriptors / Indicators: Grimacing;Moaning;Sore;Aching;Crying Pain Intervention(s): Limited activity within patient's tolerance;Monitored during session;Repositioned  Home Living                                          Prior Functioning/Environment              Frequency  Min 2X/week        Progress Toward Goals  OT Goals(current goals can now be found in the care plan section)  Progress towards OT goals: Progressing toward goals     Plan      Co-evaluation                 AM-PAC PT "6 Clicks" Daily Activity     Outcome Measure  Help from another person eating meals?: A Little Help from another person taking care of personal grooming?: A Little Help from another person toileting, which includes using toliet, bedpan, or urinal?: A Lot Help from another person bathing (including washing, rinsing, drying)?: A Lot Help from another person to put on and taking off regular upper body clothing?: A Lot Help from another person to put on and taking off regular lower body clothing?: A Lot 6 Click Score: 14    End of Session    OT Visit Diagnosis: Unsteadiness on feet (R26.81);Muscle weakness (generalized) (M62.81)   Activity Tolerance Patient limited by fatigue;Patient limited by pain   Patient Left in chair;with call bell/phone within reach;with chair alarm set   Nurse Communication          Time: 1000-1018 OT Time Calculation (min): 18 min  Charges: OT General Charges $OT Visit: 1 Visit OT Treatments $Self Care/Home Management : 8-22 mins  Lauren Crosby, OTR/L [phone] 05/30/2017   Lauren Crosby 05/30/2017, 12:48 PM

## 2017-05-30 NOTE — Progress Notes (Signed)
PROGRESS NOTE    Lauren Crosby  IOX:[id number] DOB: November 25, 1931 DOA: 05/22/2017 PCP: [name], MD    Brief Narrative:  84 year old female who presented with abdominal pain.  She does have a significant past medical history of type 2 diabetes mellitus, hypertension, nephrolithiasis and diverticulosis.  Patient reported 7 days of progressive and worsening abdominal pain, associated with poor oral intake, no nausea no vomiting.  On the initial physical examination blood pressure 143/66, heart rate 88, respiratory rate 24, temperature 97.5, oxygen saturation 100%.  Moist mucous membranes, lungs clear to auscultation bilaterally, heart S1-S2 present rhythmic, abdomen with mild tenderness, no rebound or guarding, no lower extremity edema.  137, potassium 6.8, chloride 112, bicarb 15, was 148, BUN 83, creatinine 2.35, white count 7.6, hemoglobin 7.3, hematocrit 23.4, platelets 205.  Urine analysis specific gravity 1.011, protein 100, white cells greater than 50, red cells 6-10, urinary sodium 65.  CT of the abdomen with mild to moderate left hydronephrosis and hydroureter without ureteral stone.  Chest x-ray left rotation, creased lung markings bilaterally.  Sinus rhythm left axis deviation, poor R wave progression, low voltage.  Patient was admitted to the hospital with working diagnosis of acute kidney injury due to obstructive nephropathy.   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  Acute kidney injury due to obstructive nephropathy, with hyperkalemia with volume overload. Foley catheter in place with urine output over last 24 hours at 2,900 cc. Serum cr at 1,2 with K at 3,6. Will follow on renal panel in am. Stop furosemide for now.   2.  Colonic ileus. Improved, patient is tolerating po well, no nausea or vomiting.   3.  Urinary tract infection. Continue antibiotic therapy with IV ceftriaxone  4.  Acute on chronic microcytic anemia, iron deficiency anemia, history of thalassemia. Hb and hct stable, will continue follow on on cell count, no indication for prbc transfusion.   5.  Hypertension. Continue blood pressure control with metoprolol  6.  Type 2 diabetes mellitus. Continue insulin sliding scale for glucose cover and monitoring. Capillary glucose 237, 204, 336, 300. Will add bsal insulin with 10 units of glargine.    DVT prophylaxis: heparin   Code Status:  full Family Communication: no family at the bedside Disposition Plan: SNF    Consultants:     Procedures:     Antimicrobials:       Subjective: Patient tolerating po well, no nausea or vomiting, no chest pain or dyspnea.   Objective: Vitals:   05/29/17 2130 05/29/17 2355 05/30/17 0624 05/30/17 1100  BP: (!) 133/35 130/60 (!) 135/45   Pulse: 71  77   Resp:   20   Temp: 97.8 F (36.6 C)  99.6 F (37.6 C)   TempSrc: Oral  Oral   SpO2: 100%  94%   Weight:    108.9 kg (240 lb)  Height:  Intake/Output Summary (Last 24 hours) at 05/30/2017 1424 Last data filed at 05/30/2017 1610 Gross per 24 hour  Intake 360 ml  Output 2100 ml  Net -1740 ml   Filed Weights   05/28/17 0500 05/29/17 1000 05/30/17 1100  Weight: 109.3 kg (240 lb 15.4 oz) 108.9 kg (240 lb) 108.9 kg (240 lb)    Examination:   General: deconditioned  Neurology: Awake and alert, non focal  E ENT: mild pallor, no icterus, oral mucosa moist Cardiovascular: No JVD.  S1-S2 present, rhythmic, no gallops, rubs, or murmurs. No lower extremity edema. Pulmonary: decreased breath sounds bilaterally at bases, poor air movement, no wheezing, rhonchi or rales. Gastrointestinal. Abdomen protuberant no organomegaly, non tender, no rebound or guarding Skin. No rashes Musculoskeletal: no joint deformities     Data Reviewed: I have personally reviewed following labs and imaging studies  CBC: Recent Labs  Lab 05/25/17 0319 05/26/17 0312 05/27/17 0318 05/28/17 0323 05/29/17 0343  WBC 6.6 7.1 6.8 5.8 5.4  HGB 9.5* 9.5* 9.5* 9.6* 9.5*  HCT 29.6* 30.5* 30.6* 30.7* 30.1*  MCV 68.0* 70.6* 70.5* 70.3* 69.2*  PLT 162 182 159 140* 960*   Basic Metabolic Panel: Recent Labs  Lab 05/24/17 0307 05/25/17 0319 05/26/17 0312 05/27/17 0318 05/28/17 0323 05/29/17 0343 05/30/17 0404  NA 142 140 143 136 135 134* 136  K 4.0 3.4* 3.7 3.7 3.7 3.4* 3.6  CL 105 101 104 99* 95* 93* 93*  CO2 23 24 23 26 28 29 30   GLUCOSE 157* 137* 123* 167* 123* 151* 203*  BUN 48* 32* 20 14 13 12 15   CREATININE 1.27* 1.11* 1.06* 0.96 1.03* 1.03* 1.20*  CALCIUM 8.2* 7.9* 8.1* 8.2* 8.3* 8.5* 8.4*  MG 1.4* 2.1 1.8 2.0  --   --   --   PHOS 3.5 3.4 2.7 3.0 2.6 2.9 3.6   GFR: Estimated Creatinine Clearance: 42.1 mL/min (A) (by C-G formula based on SCr of 1.2 mg/dL (H)). Liver Function Tests: Recent Labs  Lab 05/26/17 0312 05/27/17 0318 05/28/17 0323 05/29/17 0343 05/30/17 0404  ALBUMIN 2.4* 2.6* 2.6* 2.6* 2.6*   No results for input(s): LIPASE, AMYLASE in the last 168 hours. No results for input(s): AMMONIA in the last 168 hours. Coagulation Profile: No results for input(s): INR, PROTIME in the last 168 hours. Cardiac Enzymes: No results for input(s): CKTOTAL, CKMB, CKMBINDEX, TROPONINI in the last 168 hours. BNP (last 3 results) No results for input(s): PROBNP in the last 8760 hours. HbA1C: No results for input(s): HGBA1C in the last 72 hours. CBG: Recent Labs  Lab  05/29/17 1209 05/29/17 1629 05/29/17 2131 05/30/17 0731 05/30/17 1129  GLUCAP 284* 236* 237* 204* 336*   Lipid Profile: No results for input(s): CHOL, HDL, LDLCALC, TRIG, CHOLHDL, LDLDIRECT in the last 72 hours. Thyroid Function Tests: No results for input(s): TSH, T4TOTAL, FREET4, T3FREE, THYROIDAB in the last 72 hours. Anemia Panel: No results for input(s): VITAMINB12, FOLATE, FERRITIN, TIBC, IRON, RETICCTPCT in the last 72 hours.    Radiology Studies: I have reviewed all of the imaging during this hospital visit personally     Scheduled Meds: . allopurinol  300 mg Oral Daily  . clotrimazole  1 Applicatorful Vaginal QHS  . docusate sodium  100 mg Oral Q12H  . furosemide  40 mg Intravenous Q12H  . Gerhardt's butt cream   Topical TID  . insulin aspart  0-15 Units Subcutaneous TID WC & HS  . iron polysaccharides  150 mg Oral BID  . metoprolol  tartrate  25 mg Oral BID  . pantoprazole  40 mg Oral Q1200  . ramelteon  8 mg Oral QHS   Continuous Infusions: . cefTRIAXone (ROCEPHIN)  IV Stopped (05/29/17 2326)     LOS: 8 days        Mauricio Gerome Apley, MD [facility] Pager [phone]

## 2017-05-31 LAB — GLUCOSE, CAPILLARY
GLUCOSE-CAPILLARY: 230 mg/dL — AB (ref 65–99)
GLUCOSE-CAPILLARY: 282 mg/dL — AB (ref 65–99)
GLUCOSE-CAPILLARY: 329 mg/dL — AB (ref 65–99)
GLUCOSE-CAPILLARY: 260 mg/dL — AB (ref 65–99)

## 2017-05-31 LAB — RENAL FUNCTION PANEL
ANION GAP: 13 (ref 5–15)
Albumin: 3 g/dL — ABNORMAL LOW (ref 3.5–5.0)
BUN: 19 mg/dL (ref 6–20)
CHLORIDE: 92 mmol/L — AB (ref 101–111)
CO2: 31 mmol/L (ref 22–32)
Calcium: 8.4 mg/dL — ABNORMAL LOW (ref 8.9–10.3)
Creatinine, Ser: 1.22 mg/dL — ABNORMAL HIGH (ref 0.44–1.00)
GFR calc Af Amer: 46 mL/min — ABNORMAL LOW (ref >60)
GFR, EST NON AFRICAN AMERICAN: 40 mL/min — AB (ref >60)
Glucose, Bld: 222 mg/dL — ABNORMAL HIGH (ref 65–99)
POTASSIUM: 3.5 mmol/L (ref 3.5–5.1)
Phosphorus: 4.1 mg/dL (ref 2.5–4.6)
Sodium: 136 mmol/L (ref 135–145)

## 2017-05-31 NOTE — Progress Notes (Signed)
PROGRESS NOTE    Lauren Crosby  HQI:[id number] DOB: 17-Jan-1931 DOA: 05/22/2017 PCP: [name], MD    Brief Narrative:  84 year old female who presented with abdominal pain.  She does have a significant past medical history of type 2 diabetes mellitus, hypertension, nephrolithiasis and diverticulosis.  Patient reported 7 days of progressive and worsening abdominal pain, associated with poor oral intake, no nausea no vomiting.  On the initial physical examination blood pressure 143/66, heart rate 88, respiratory rate 24, temperature 97.5, oxygen saturation 100%.  Moist mucous membranes, lungs clear to auscultation bilaterally, heart S1-S2 present rhythmic, abdomen with mild tenderness, no rebound or guarding, no lower extremity edema.  137, potassium 6.8, chloride 112, bicarb 15, was 148, BUN 83, creatinine 2.35, white count 7.6, hemoglobin 7.3, hematocrit 23.4, platelets 205.  Urine analysis specific gravity 1.011, protein 100, white cells greater than 50, red cells 6-10, urinary sodium 65.  CT of the abdomen with mild to moderate left hydronephrosis and hydroureter without ureteral stone.  Chest x-ray left rotation, creased lung markings bilaterally.  Sinus rhythm left axis deviation, poor R wave progression, low voltage.  Patient was admitted to the hospital with working diagnosis of acute kidney injury due to obstructive nephropathy.   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  Acute kidney injury due to obstructive nephropathy, with hyperkalemia with volume overload.  - Continue Foley catheter  2.  Colonic ileus. -  Resolving, tolerating po well, no nausea or vomiting.   3.  Urinary tract infection.  - Pt on Rocephin  4.  Acute on chronic microcytic anemia, iron deficiency anemia, history of thalassemia. -  Hb and hct stable, will continue follow on on cell count, no indication for prbc transfusion.   5.  Hypertension.  - Continue blood pressure control with metoprolol  6.  Type 2 diabetes mellitus.  - Continue insulin sliding scale insulin   DVT prophylaxis: heparin   Code Status:  full Family Communication: no family at the bedside Disposition Plan: SNF    Consultants:     Procedures:     Antimicrobials:    Rocephin   Subjective: Patient has no new complaints currently.  Objective: Vitals:   05/30/17 0624 05/30/17 1100 05/30/17 2201 05/31/17 0646  BP: (!) 135/45  (!) 173/64 (!) 155/49  Pulse: 77  78 71  Resp: 20  20 20   Temp: 99.6 F (37.6 C)  98.3 F (36.8 C) 98.2 F (36.8 C)  TempSrc: Oral  Oral Oral  SpO2: 94%  96% 94%  Weight:  108.9 kg (240 lb)  107.1 kg (236 lb 1.8 oz)  Height:        Intake/Output Summary (Last 24 hours) at 05/31/2017 1606 Last data filed at 05/31/2017 0645 Gross per 24 hour  Intake 240 ml  Output 2000 ml  Net -1760 ml   [employer]  05/29/17 1000 05/30/17 1100 05/31/17 0646  Weight: 108.9 kg (240 lb) 108.9 kg (240 lb) 107.1 kg (236 lb 1.8 oz)    Examination:   General: deconditioned, in nad.  Neurology: Awake and alert, non focal  E ENT: mild pallor, no icterus, oral mucosa moist Cardiovascular: No JVD. S1-S2 present, rhythmic, no gallops, rubs, or murmurs. No lower extremity edema. Pulmonary: decreased breath sounds bilaterally at bases, poor air movement, no wheezing, rhonchi or rales. Gastrointestinal. Abdomen protuberant no  organomegaly, non tender, no rebound or guarding Skin. No rashes Musculoskeletal: no joint deformities   Data Reviewed: I have personally reviewed following labs and imaging studies  CBC: Recent Labs  Lab 05/25/17 0319 05/26/17 0312 05/27/17 0318 05/28/17 0323 05/29/17 0343  WBC 6.6 7.1 6.8 5.8 5.4  HGB 9.5* 9.5* 9.5* 9.6* 9.5*  HCT 29.6* 30.5* 30.6* 30.7* 30.1*  MCV 68.0* 70.6* 70.5* 70.3* 69.2*  PLT 162 182 159 140* 623*   Basic Metabolic Panel: Recent Labs  Lab 05/25/17 0319 05/26/17 0312 05/27/17 0318 05/28/17 0323 05/29/17 0343 05/30/17 0404 05/31/17 0348  NA 140 143 136 135 134* 136 136  K 3.4* 3.7 3.7 3.7 3.4* 3.6 3.5  CL 101 104 99* 95* 93* 93* 92*  CO2 24 23 26 28 29 30 31   GLUCOSE 137* 123* 167* 123* 151* 203* 222*  BUN 32* 20 14 13 12 15 19   CREATININE 1.11* 1.06* 0.96 1.03* 1.03* 1.20* 1.22*  CALCIUM 7.9* 8.1* 8.2* 8.3* 8.5* 8.4* 8.4*  MG 2.1 1.8 2.0  --   --   --   --   PHOS 3.4 2.7 3.0 2.6 2.9 3.6 4.1   GFR: Estimated Creatinine Clearance: 41 mL/min (A) (by C-G formula based on SCr of 1.22 mg/dL (H)). Liver Function Tests: Recent Labs  Lab 05/27/17 0318 05/28/17 0323 05/29/17 0343 05/30/17 0404 05/31/17 0348  ALBUMIN 2.6* 2.6* 2.6* 2.6* 3.0*   No results for input(s): LIPASE, AMYLASE in the last 168 hours. No results for input(s): AMMONIA in the last 168 hours. Coagulation Profile: No results for input(s): INR, PROTIME in the last 168 hours. Cardiac Enzymes: No results for input(s): CKTOTAL, CKMB, CKMBINDEX, TROPONINI in the last 168 hours. BNP (last 3 results) No results for input(s): PROBNP in the last 8760 hours. HbA1C: No results for input(s): HGBA1C in the last 72 hours. CBG: Recent Labs  Lab 05/30/17 1129 05/30/17 1621 05/30/17 2154 05/31/17 0732 05/31/17 1224  GLUCAP 336* 300* 308* 230* 282*   Lipid Profile: No results for input(s): CHOL, HDL, LDLCALC, TRIG, CHOLHDL, LDLDIRECT in the last 72 hours. Thyroid Function  Tests: No results for input(s): TSH, T4TOTAL, FREET4, T3FREE, THYROIDAB in the last 72 hours. Anemia Panel: No results for input(s): VITAMINB12, FOLATE, FERRITIN, TIBC, IRON, RETICCTPCT in the last 72 hours.   Radiology Studies: I have reviewed all of the imaging during this hospital visit personally   Scheduled Meds: . allopurinol  300 mg Oral Daily  . clotrimazole  1 Applicatorful Vaginal QHS  . docusate sodium  100 mg Oral Q12H  . Gerhardt's butt cream   Topical TID  . insulin aspart  0-15 Units Subcutaneous TID WC & HS  . insulin glargine  10 Units Subcutaneous QHS  . iron polysaccharides  150 mg Oral BID  . metoprolol tartrate  25 mg Oral BID  . pantoprazole  40 mg Oral Q1200  . ramelteon  8 mg Oral QHS   Continuous Infusions: . cefTRIAXone (ROCEPHIN)  IV Stopped (05/31/17 0005)  LOS: 9 days    Velvet Bathe, MD [facility] Pager [phone]

## 2017-05-31 NOTE — Progress Notes (Signed)
Physical Therapy Treatment Patient Details Name: Lauren Crosby MRN: [id number] DOB: 05/25/1931 Today's Date: 05/31/2017    History of Present Illness Pt admitted through ED with dx of ARF, hyperkalemia, and anemia.  Pt with history of DM with retinopathy, nephropathy and Neuropathy.   Pt also with chronic LE edema and bilat knee and back pain limiting mobility at home.    PT Comments    Assisted OOB to attempt amb to bathroom however pt only able to progress gait to 2 feet due to increased back pain, increased fatigue and fear of falling.  BSC brought to her.  Pt required assist for peri care and assist for transfers.  Pt does not want to go to a Rehab and wants home however she is not at a safe mobility level do to so.    Follow Up Recommendations  SNF     Equipment Recommendations  None recommended by PT    Recommendations for Other Services       Precautions / Restrictions Precautions Precautions: Fall Restrictions Weight Bearing Restrictions: No    Mobility  Bed Mobility Overal bed mobility: Needs Assistance Bed Mobility: Supine to Sit     Supine to sit: Mod assist;Max assist     General bed mobility comments: HOB elevated and increased time, pt required much assist to get to EOB  Transfers Overall transfer level: Needs assistance Equipment used: Rolling walker (2 wheeled);None Transfers: Sit to/from [facility] Sit to Stand: Mod assist Stand pivot transfers: Mod assist       General transfer comment: required increased assist and much effort to complete  Ambulation/Gait Ambulation/Gait assistance: Min guard Ambulation Distance (Feet): 2 Feet Assistive device: Rolling walker (2 wheeled) Gait Pattern/deviations: Step-to pattern;Decreased step length - right;Decreased step length - left Gait velocity: decreased    General Gait Details: attempted to have pt amb from bed to bathroom approx 12 feet however pt was only able to get to 2 feet  with max c/o back pain, fatigue and increased anxiety/fear of falling.     Stairs             Wheelchair Mobility    Modified Rankin (Stroke Patients Only)       Balance                                            Cognition Arousal/Alertness: Awake/alert Behavior During Therapy: WFL for tasks assessed/performed Overall Cognitive Status: Within Functional Limits for tasks assessed                                        Exercises      General Comments        Pertinent Vitals/Pain Pain Assessment: Faces Faces Pain Scale: Hurts even more Pain Location: back Pain Descriptors / Indicators: Grimacing;Moaning;Sore;Aching;Crying Pain Intervention(s): Monitored during session;Repositioned    Home Living                      Prior Function            PT Goals (current goals can now be found in the care plan section) Progress towards PT goals: Progressing toward goals    Frequency    Min 3X/week      PT Plan Current plan  remains appropriate    Co-evaluation              AM-PAC PT "6 Clicks" Daily Activity  Outcome Measure  Difficulty turning over in bed (including adjusting bedclothes, sheets and blankets)?: A Lot Difficulty moving from lying on back to sitting on the side of the bed? : A Lot Difficulty sitting down on and standing up from a chair with arms (e.g., wheelchair, bedside commode, etc,.)?: A Lot Help needed moving to and from a bed to chair (including a wheelchair)?: A Lot Help needed walking in hospital room?: A Lot Help needed climbing 3-5 steps with a railing? : A Lot 6 Click Score: 12    End of Session Equipment Utilized During Treatment: Gait belt Activity Tolerance: Patient limited by fatigue;Patient limited by pain(pt self limiting) Patient left: in chair;with call bell/phone within reach;with chair alarm set Nurse Communication: Mobility status PT Visit Diagnosis: Difficulty in  walking, not elsewhere classified (R26.2)     Time: 0947-0962 PT Time Calculation (min) (ACUTE ONLY): 27 min  Charges:  $Gait Training: 8-22 mins $Therapeutic Activity: 8-22 mins                    G Codes:       Rica Koyanagi  PTA [facility]  Acute  Rehab Pager      [phone]

## 2017-05-31 NOTE — Progress Notes (Signed)
Occupational Therapy Treatment Patient Details Name: Lauren Crosby MRN: [id number] DOB: 1932/01/09 Today's Date: 05/31/2017    History of present illness Pt admitted through ED with dx of ARF, hyperkalemia, and anemia.  Pt with history of DM with retinopathy, nephropathy and Neuropathy.   Pt also with chronic LE edema and bilat knee and back pain limiting mobility at home.   OT comments  Pt agreeable to ADL from EOB. Still needs significant assistance for adls and bed mobility but +1.  Would benefit from SNF but she wants to go home.     Follow Up Recommendations  SNF(if pt refuses, 24/7 and HHOT)    Equipment Recommendations  3 in 1 bedside commode(may need wide)    Recommendations for Other Services      Precautions / Restrictions Precautions Precautions: Fall Restrictions Weight Bearing Restrictions: No       Mobility Bed Mobility         Supine to sit: Mod assist Sit to supine: Mod assist   General bed mobility comments: assist for legs and trunk for OOB; pt used rail. Assist for bil LEs for back to bed  Transfers   Equipment used: Rolling walker (2 wheeled);None   Sit to Stand: Min assist         General transfer comment: assist to rise and steady    Balance                                           ADL either performed or assessed with clinical judgement   ADL           Upper Body Bathing: Minimal assistance;Sitting   Lower Body Bathing: Maximal assistance;Sit to/from stand   Upper Body Dressing : Minimal assistance;Sitting                     General ADL Comments: performed ADL from EOB, sit to stand.       Vision       Perception     Praxis      Cognition Arousal/Alertness: Awake/alert Behavior During Therapy: WFL for tasks assessed/performed Overall Cognitive Status: Within Functional Limits for tasks assessed                                          Exercises     Shoulder  Instructions       General Comments      Pertinent Vitals/ Pain       Pain Assessment: Faces Faces Pain Scale: Hurts little more Pain Location: legs Pain Descriptors / Indicators: Grimacing;Moaning;Sore;Aching;Crying Pain Intervention(s): Limited activity within patient's tolerance;Monitored during session;Repositioned  Home Living                                          Prior Functioning/Environment              Frequency  Min 2X/week        Progress Toward Goals  OT Goals(current goals can now be found in the care plan section)  Progress towards OT goals: Progressing toward goals     Plan      Co-evaluation  AM-PAC PT "6 Clicks" Daily Activity     Outcome Measure   Help from another person eating meals?: A Little Help from another person taking care of personal grooming?: A Little Help from another person toileting, which includes using toliet, bedpan, or urinal?: A Lot Help from another person bathing (including washing, rinsing, drying)?: A Lot Help from another person to put on and taking off regular upper body clothing?: A Little Help from another person to put on and taking off regular lower body clothing?: A Lot 6 Click Score: 15    End of Session    OT Visit Diagnosis: Unsteadiness on feet (R26.81);Muscle weakness (generalized) (M62.81)   Activity Tolerance Patient limited by fatigue   Patient Left in bed;with call bell/phone within reach;with bed alarm set   Nurse Communication          Time: 9892-1194 OT Time Calculation (min): 27 min  Charges: OT General Charges $OT Visit: 1 Visit OT Treatments $Self Care/Home Management : 23-37 mins  [name], OTR/L [phone] 05/31/2017   [place] 05/31/2017, 8:40 AM

## 2017-05-31 NOTE — Progress Notes (Addendum)
Inpatient Diabetes Program Recommendations  AACE/ADA: New Consensus Statement on Inpatient Glycemic Control (2015)  Target Ranges:  Prepandial:   less than 140 mg/dL      Peak postprandial:   less than 180 mg/dL (1-2 hours)      Critically ill patients:  140 - 180 mg/dL   Results for Lauren Crosby, Lauren Crosby (MRN [id number]) as of 05/31/2017 09:05  Ref. Range 05/30/2017 07:31 05/30/2017 11:29 05/30/2017 16:21 05/30/2017 21:54  Glucose-Capillary Latest Ref Range: 65 - 99 mg/dL 204 (H)  5 units NOVOLOG  336 (H)  11 units NOVOLOG  300 (H)  8 units NOVOLOG  308 (H)  11 units NOVOLOG +  10 units LANTUS   Results for Lauren Crosby, Lauren Crosby (MRN [id number]) as of 05/31/2017 09:05  Ref. Range 05/31/2017 07:32  Glucose-Capillary Latest Ref Range: 65 - 99 mg/dL 230 (H)  5 units NOVOLOG    HomeDM Meds: 70/30 Insulin 45unitsAM/ 50unitsPM  Metformin 1000 mgBID   CurrentOrders:Novolog Moderate Correction Scale/ SSI (0-15 units) TID AC + HS      Lantus 10 units QHS      Started on CL diet 05/20.  Progressed to Soft PO diet 05/21.  Per records, patient takes 70/30 Insulin at home.   Needs to be eating well to resume 70/30 Insulin.    MD- Note Lantus started last PM.  CBG still elevated this AM: 230 mg/dl.  Note that patient has resumed PO diet and is no longer having nausea or vomiting.  If you feel like patient is tolerating her diet well, may consider stopping the Lantus and resuming a portion of her 70/30 Insulin.  Would start with 50% home doses:  70/30 Insulin- 22 units in the AM with Breakfast and 25 units in the PM with Dinner  If PO Intake still labile, recommend increasing Lantus dose for tonight instead.      --Will follow patient during hospitalization--  Wyn Quaker RN, MSN, CDE Diabetes Coordinator Inpatient Glycemic Control Team Team Pager: [phone] (8a-5p)

## 2017-06-01 LAB — BASIC METABOLIC PANEL
Anion gap: 13 (ref 5–15)
BUN: 18 mg/dL (ref 6–20)
CALCIUM: 8.2 mg/dL — AB (ref 8.9–10.3)
CHLORIDE: 96 mmol/L — AB (ref 101–111)
CO2: 30 mmol/L (ref 22–32)
CREATININE: 1.38 mg/dL — AB (ref 0.44–1.00)
GFR calc non Af Amer: 34 mL/min — ABNORMAL LOW (ref >60)
GFR, EST AFRICAN AMERICAN: 40 mL/min — AB (ref >60)
Glucose, Bld: 214 mg/dL — ABNORMAL HIGH (ref 65–99)
Potassium: 3.4 mmol/L — ABNORMAL LOW (ref 3.5–5.1)
SODIUM: 139 mmol/L (ref 135–145)

## 2017-06-01 LAB — RENAL FUNCTION PANEL
ALBUMIN: 2.6 g/dL — AB (ref 3.5–5.0)
ANION GAP: 13 (ref 5–15)
BUN: 18 mg/dL (ref 6–20)
CALCIUM: 8.3 mg/dL — AB (ref 8.9–10.3)
CO2: 30 mmol/L (ref 22–32)
CREATININE: 1.41 mg/dL — AB (ref 0.44–1.00)
Chloride: 95 mmol/L — ABNORMAL LOW (ref 101–111)
GFR, EST AFRICAN AMERICAN: 39 mL/min — AB (ref >60)
GFR, EST NON AFRICAN AMERICAN: 33 mL/min — AB (ref >60)
Glucose, Bld: 216 mg/dL — ABNORMAL HIGH (ref 65–99)
PHOSPHORUS: 4 mg/dL (ref 2.5–4.6)
Potassium: 3.4 mmol/L — ABNORMAL LOW (ref 3.5–5.1)
SODIUM: 138 mmol/L (ref 135–145)

## 2017-06-01 LAB — GLUCOSE, CAPILLARY
GLUCOSE-CAPILLARY: 256 mg/dL — AB (ref 65–99)
Glucose-Capillary: 319 mg/dL — ABNORMAL HIGH (ref 65–99)

## 2017-06-01 MED ORDER — METOPROLOL TARTRATE 25 MG PO TABS: 25.0000 mg | ORAL_TABLET | Freq: Two times a day (BID) | ORAL | 0 refills | Status: DC

## 2017-06-01 NOTE — Progress Notes (Signed)
Pt and husband have declined SNF placement. Please reconsult if needs arise.   Sharren Bridge, MSW, LCSW Clinical Social Work 06/01/2017 [phone]

## 2017-06-01 NOTE — Progress Notes (Signed)
Inpatient Diabetes Program Recommendations  AACE/ADA: New Consensus Statement on Inpatient Glycemic Control (2015)  Target Ranges:  Prepandial:   less than 140 mg/dL      Peak postprandial:   less than 180 mg/dL (1-2 hours)      Critically ill patients:  140 - 180 mg/dL   Results for Lauren Crosby, Lauren Crosby (MRN [id number]) as of 06/01/2017 09:25  Ref. Range 05/31/2017 07:32 05/31/2017 12:24 05/31/2017 16:51 05/31/2017 21:59  Glucose-Capillary Latest Ref Range: 65 - 99 mg/dL 230 (H) 282 (H) 329 (H) 260 (H)   Results for Lauren Crosby, Lauren Crosby (MRN [id number]) as of 06/01/2017 09:25  Ref. Range 06/01/2017 07:50  Glucose-Capillary Latest Ref Range: 65 - 99 mg/dL 256 (H)    HomeDM Meds: 70/30 Insulin 45unitsAM/ 50unitsPM  Metformin 1000 mgBID   CurrentOrders:Novolog Moderate Correction Scale/ SSI (0-15 units)TID AC + HS                            Lantus 10 units QHS      Started on CL diet05/20.Progressed to Soft PO diet 05/21.  Per records, patient takes 70/30 Insulin at home.   Needs to be eating well to resume 70/30 Insulin.    MD- Note that patient has resumed PO diet and is no longer having nausea or vomiting.  If you feel like patient is tolerating her diet well, may consider stopping the Lantus and resuming a portion of her 70/30 Insulin.  Would start with 50% home doses:  70/30 Insulin- 22 units in the AM with Breakfast and 25 units in the PM with Dinner  If PO Intake still labile, recommend increasing Lantus dose for tonight instead.    --Will follow patient during hospitalization--  Wyn Quaker RN, MSN, CDE Diabetes Coordinator Inpatient Glycemic Control Team Team Pager: [phone] (8a-5p)

## 2017-06-01 NOTE — Discharge Summary (Addendum)
Physician Discharge Summary  REET SCHARRER GQQ:[id number] DOB: [birth year] DOA: 05/22/2017  PCP: [name], MD  Admit date: 05/22/2017 Discharge date: 06/01/2017  Time spent: 36 minutes  Recommendations for Outpatient Follow-up:  1. Monitor blood sugars and adjust hypoglycemic agents accordingly 2. Ensure follow-up with urologist for further evaluation recommendations 3. Discontinued metformin given rise in serum creatinine 4. 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)   Hypertension   Acute kidney failure (HCC)   Metabolic acidosis, normal anion gap (NAG)   Hyperkalemia, diminished renal excretion   Acute lower UTI   Iron deficiency anemia   Pressure injury of buttock, stage 2   Acute on chronic anemia   Hyperkalemia   Hydronephrosis   Abdominal pain   Vaginal candidiasis   Dermatitis associated with moisture   Ileus [facility]): Colonic   Pyelitis: Mild per CT 05/24/2017   Acidemia   Discharge Condition: stable  Diet recommendation: diabetic diet  Filed Weights   05/30/17 1100 05/31/17 0646 06/01/17 0605  Weight: 108.9 kg (240 lb) 107.1 kg (236 lb 1.8 oz) 106.9 kg (235 lb 10.8 oz)    History of present illness:  84 year old female who presented with abdominal pain.  She does have a significant past medical history of type 2 diabetes mellitus, hypertension, nephrolithiasis and diverticulosis.  Patient reported 7 days of progressive and worsening abdominal pain, associated with poor oral intake, no nausea no vomiting.  On the initial physical examination blood pressure 143/66, heart rate 88, respiratory rate 24, temperature 97.5, oxygen saturation 100%.  Moist mucous membranes, lungs clear to auscultation bilaterally, heart S1-S2 present rhythmic,  abdomen with mild tenderness, no rebound or guarding, no lower extremity edema.  137, potassium 6.8, chloride 112, bicarb 15, was 148, BUN 83, creatinine 2.35, white count 7.6, hemoglobin 7.3, hematocrit 23.4, platelets 205.  Urine analysis specific gravity 1.011, protein 100, white cells greater than 50, red cells 6-10, urinary sodium 65.  CT of the abdomen with mild to moderate left hydronephrosis and hydroureter without ureteral stone.  Chest x-ray left rotation, creased lung markings bilaterally.  Sinus rhythm left axis deviation, poor R wave progression, low voltage.  Patient was admitted to the hospital with working diagnosis of acute kidney injury due to obstructive nephropathy.    Hospital Course:   1.  Acute kidney injury due to obstructive nephropathy, with hyperkalemia with volume overload.  - Continue Foley catheter on discharge. Pt is to follow up with Urology after hospital discharge for further evaluation recommendations.  This was discussed with patient and husband.  They have verbalized their agreement and understanding.  2.  Colonic ileus. -  Resolved  3.  Urinary tract infection.  - Pt completed 7 day treatment with Rocephin  4.  Acute on chronic microcytic anemia, iron deficiency anemia, history of thalassemia. -  Hb and hct stable  5.  Hypertension.  - Continue blood pressure control with metoprolol - d/c ACE I on d/c due to rise in serum creatinine  6.  Type 2 diabetes mellitus.  - Continue prior to admission medication regimen - d/c metformin on d/c due to rising creatinine  Procedures:  none  Consultations:  GI  Urology  Nephrology  Discharge Exam: Vitals:   06/01/17 0605 06/01/17 1504  BP: (!) 152/60 (!) 131/51  Pulse: 77 77  Resp: 20 20  Temp: 98.4 F (36.9 C) 98.9 F (37.2 C)  SpO2: 94% 98%    General: Pt in nad, alert and awake Cardiovascular: rrr, no rubs Respiratory: no increased wob, no wheezes  Discharge  Instructions   Discharge Instructions    Call MD for:  severe uncontrolled pain   Complete by:  As directed    Call MD for:  temperature >100.4   Complete by:  As directed    Diet - low sodium heart healthy   Complete by:  As directed    Discharge instructions   Complete by:  As directed    Please follow-up with urology for further evaluation recommendations after hospital discharge.  Please call their office for follow-up appointment date and time.   Increase activity slowly   Complete by:  As directed      Allergies as of 06/01/2017      Reactions   Penicillins Other (See Comments)   Has patient had a PCN reaction causing immediate rash, facial/tongue/throat swelling, SOB or lightheadedness with hypotension: No Has patient had a PCN reaction causing severe rash involving mucus membranes or skin necrosis: No Has patient had a PCN reaction that required hospitalization: No Has patient had a PCN reaction occurring within the last 10 years: Unknown If all of the above answers are "NO", then may proceed with Cephalosporin use.      Medication List    STOP taking these medications   lisinopril 5 MG tablet Commonly known as:  PRINIVIL,ZESTRIL   metFORMIN 1000 MG tablet Commonly known as:  GLUCOPHAGE   nitrofurantoin (macrocrystal-monohydrate) 100 MG capsule Commonly known as:  MACROBID     TAKE these medications   allopurinol 300 MG tablet Commonly known as:  ZYLOPRIM Take 300 mg by mouth daily.   docusate sodium 100 MG capsule Commonly known as:  COLACE Take 100 mg by mouth every 12 (twelve) hours.   esomeprazole 40 MG capsule Commonly known as:  NEXIUM Take 40 mg by mouth daily.   furosemide 20 MG tablet Commonly known as:  LASIX Take 20-40 mg by mouth daily. 40mg  in the morning, 20 mg in the evening   guaiFENesin-dextromethorphan 100-10 MG/5ML syrup Commonly known as:  ROBITUSSIN DM Take 5 mLs by mouth every 4 (four) hours as needed for cough.   insulin  NPH-regular Human (70-30) 100 UNIT/ML injection Commonly known as:  NOVOLIN 70/30 45 units in the am and 50 units at supper   iron polysaccharides 150 MG capsule Commonly known as:  NIFEREX Take 150 mg by mouth 2 (two) times daily.   metoprolol tartrate 25 MG tablet Commonly known as:  LOPRESSOR Take 1 tablet (25 mg total) by mouth 2 (two) times daily.   multivitamin with minerals Tabs tablet Take 1 tablet by mouth daily.   PRESERVISION AREDS 2+MULTI VIT PO Take 1 capsule by mouth 2 (two) times daily.   simvastatin 80 MG tablet Commonly known as:  ZOCOR Take 80 mg by mouth at bedtime.      Allergies  Allergen Reactions  . Penicillins Other (See Comments)    Has patient had a PCN reaction causing immediate rash, facial/tongue/throat swelling, SOB or lightheadedness with hypotension: No Has patient had a PCN reaction causing severe rash involving mucus membranes or skin necrosis: No Has patient had a PCN reaction that required hospitalization: No Has patient had a PCN reaction occurring within the last 10 years: Unknown If all of the above answers are "  NO", then may proceed with Cephalosporin use.   Follow-up Information    Festus Aloe, MD .   Specialty:  Urology Contact information: [place] [address] [phone]            The results of significant diagnostics from this hospitalization (including imaging, microbiology, ancillary and laboratory) are listed below for reference.    Significant Diagnostic Studies: Ct Abdomen Pelvis Wo Contrast  Result Date: 05/22/2017 CLINICAL DATA:  Abdominal pain with hyperkalemia EXAM: CT ABDOMEN AND PELVIS WITHOUT CONTRAST TECHNIQUE: Multidetector CT imaging of the abdomen and pelvis was performed following the standard protocol without IV contrast. COMPARISON:  CT 03/24/2016, ultrasound 05/22/2017, CT 02/10/2015, 03/24/2016, 05/03/2009 FINDINGS: Lower chest: Lung bases demonstrate no acute consolidation or  pleural effusion. Borderline cardiomegaly. Stable nodule in the low retroareolar left breast, presumed benign given lack change. Coronary vascular calcification Hepatobiliary: No focal liver abnormality is seen. Status post cholecystectomy. No biliary dilatation. Pancreas: Unremarkable. No pancreatic ductal dilatation or surrounding inflammatory changes. Spleen: Normal in size without focal abnormality. Adrenals/Urinary Tract: Right adrenal gland is normal. 3.4 cm left adrenal gland mass slow growth over time but felt consistent with adenoma. Mildly hyperdense 3.8 cm mass inferior pole right kidney, slow enlargement over time. Mild to moderate left hydronephrosis and hydroureter. No ureteral stone. Bladder within normal limits. Stomach/Bowel: Stomach nonenlarged. No dilated small bowel. Diverticular disease of the colon without acute thickening. Vascular/Lymphatic: Moderate aortic atherosclerosis. No aneurysmal dilatation. No significantly enlarged lymph nodes. Reproductive: Status post hysterectomy. No adnexal masses. Other: Negative for free air or free fluid. Musculoskeletal: Degenerative changes of the spine. No acute or suspicious abnormality IMPRESSION: 1. Mild to moderate left hydronephrosis and hydroureter, but without ureteral stone identified. 2. Diffuse diverticular disease of the colon without acute inflammation 3. 3.4 cm left adrenal gland adenoma 4. Mildly hyperdense right inferior renal mass, slow enlargement since 2011 Electronically Signed   By: Donavan Foil M.D.   On: 05/22/2017 21:30   Dg Chest 2 View  Result Date: 05/27/2017 CLINICAL DATA:  Per order- wheezing PT HX: DM, GERD, HTN, pneumonia, non smoker EXAM: CHEST - 2 VIEW COMPARISON:  05/24/2017 FINDINGS: Low lung volumes. Some increase in bilateral interstitial edema or infiltrates. Central pulmonary vascular congestion. Moderate cardiomegaly stable. No effusion. Visualized bones unremarkable. IMPRESSION: 1. Interval increase in bilateral  interstitial edema or infiltrates. Electronically Signed   By: Lucrezia Europe M.D.   On: 05/27/2017 13:56   Dg Chest 2 View  Result Date: 05/24/2017 CLINICAL DATA:  Shortness of breath. EXAM: CHEST - 2 VIEW COMPARISON:  Radiographs of May 22, 2017. FINDINGS: Stable cardiomegaly. No pneumothorax or pleural effusion is noted. No acute pulmonary disease is noted. Bony thorax is unremarkable. IMPRESSION: No active cardiopulmonary disease. Electronically Signed   By: Marijo Conception, M.D.   On: 05/24/2017 11:32   Ct Abdomen Pelvis W Contrast  Result Date: 05/24/2017 CLINICAL DATA:  Diffuse abdominal pain for week. Abdominal distension. History of nephrolithiasis, diverticulitis, ventral hernia repair, cholecystectomy, appendectomy and hysterectomy. EXAM: CT ABDOMEN AND PELVIS WITH CONTRAST TECHNIQUE: Multidetector CT imaging of the abdomen and pelvis was performed using the standard protocol following bolus administration of intravenous contrast. CONTRAST:  2mL ISOVUE-300 IOPAMIDOL (ISOVUE-300) INJECTION 61%, 50mL OMNIPAQUE IOHEXOL 300 MG/ML SOLN COMPARISON:  Abdominal radiograph May 24, 2017 and CT abdomen and pelvis May 22, 2017 and CT abdomen and pelvis February 10, 2015 and CT abdomen and pelvis May 03, 2009. FINDINGS: LOWER CHEST: Mild bibasilar bronchiectasis. 4  mm lingular pulmonary nodule, not definitely included on prior CTs though partially imaged in 2018. No routine indicated follow-up. Heart size is normal. Severe coronary artery calcifications. HEPATOBILIARY: Slightly nodular liver contour. Status post cholecystectomy. PANCREAS: Normal. SPLEEN: Normal. ADRENALS/URINARY TRACT: Kidneys are orthotopic, demonstrating symmetric enhancement. No nephrolithiasis or hydronephrosis. Exophytic nonenhancing 3.8 cm RIGHT lower pole renal mass was 3.4 cm in 2011, likely benign. Too small to characterize hypodensities RIGHT kidney. The unopacified ureters are normal in course and caliber, mild urothelial enhancement.  Delayed imaging through the kidneys demonstrates symmetric prompt contrast excretion within the proximal urinary collecting system. Urinary bladder is decompressed by Foley catheter. Stable 3 mm LEFT adrenal adenoma from 2011. STOMACH/BOWEL: Colonic air contrast levels with small amount of retained large bowel, large bowel measures to 8.2 cm. Relatively decompressed small bowel. VASCULAR/LYMPHATIC: Aortoiliac vessels are normal in course and caliber. Moderate calcific atherosclerosis. No lymphadenopathy by CT size criteria. REPRODUCTIVE: Status post hysterectomy. OTHER: Protuberant abdomen. Small volume free fluid RIGHT upper quadrant. No drainable fluid collection. No intraperitoneal free air. MUSCULOSKELETAL: Nonacute. Osteopenia. Advanced lower lumbar facet arthropathy. IMPRESSION: 1. 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 No bowel obstruction. Colonic diverticulosis. 2. Mildly nodular liver compatible with cirrhosis with new small volume ascites. 3. Mild suspected pyelitis, recommend correlation with urinary analysis. Aortic Atherosclerosis (ICD10-I70.0). Electronically Signed   By: Elon Alas M.D.   On: 05/24/2017 15:23   US Renal  Result Date: 05/22/2017 CLINICAL DATA:  Acute kidney injury. EXAM: RENAL / URINARY TRACT ULTRASOUND COMPLETE COMPARISON:  03/24/2016 CT abdomen/pelvis. FINDINGS: Right Kidney: Length: 11.4 cm. No right hydronephrosis. Echogenic right renal parenchyma, which is normal in thickness. No right renal mass. Left Kidney: Length: 13.3 cm. No left hydronephrosis. Echogenic left renal parenchyma, which is normal in thickness. No left renal mass. Bladder: Bladder is not visualized on this scan significantly limited by patient body habitus. IMPRESSION: 1. No hydronephrosis. 2. Echogenic kidneys, indicative of nonspecific renal parenchymal disease of uncertain chronicity. 3. Bladder not visualized on this scan, which is significantly limited by patient body habitus.  Electronically Signed   By: Ilona Sorrel M.D.   On: 05/22/2017 20:58   Dg Abd 2 Views  Result Date: 05/27/2017 CLINICAL DATA:  Wheezing.  Abdominal distension EXAM: ABDOMEN - 2 VIEW COMPARISON:  CT 05/24/2017, radiograph 05/26/2017, FINDINGS: RIGHT-sided down radiograph small amount gas over the margin of the liver which is favored to represent extension of the RIGHT lung base/diaphragmatic sulcus. Gas-filled loops of colon are similar to comparison exam. There is a progression of the oral contrast. Multiple diverticular noted throughout the colon. IMPRESSION: 1. No evidence of bowel obstruction. 2. Multiple colonic diverticula. 3. No clear evidence of intraperitoneal free air. Electronically Signed   By: Suzy Bouchard M.D.   On: 05/27/2017 13:47   Dg Abd 2 Views  Result Date: 05/26/2017 CLINICAL DATA:  Abdominal distention. EXAM: ABDOMEN - 2 VIEW COMPARISON:  05/25/2017 FINDINGS: Bowel gas pattern is nonobstructive with air and contrast throughout the colon. Spherical air-filled structure projects over the anal rectal junction unchanged likely rectal catheter. No evidence of free peritoneal air. IMPRESSION: Nonobstructive bowel gas pattern. Electronically Signed   By: Marin Olp M.D.   On: 05/26/2017 09:10   Dg Abd 2 Views  Result Date: 05/25/2017 CLINICAL DATA:  84 year old female with abdominal pain and distension. Large bowel distention on CT Abdomen and Pelvis yesterday. EXAM: ABDOMEN - 2 VIEW COMPARISON:  CT Abdomen and Pelvis 05/24/2017 and earlier. FINDINGS: Supine and left-side-down lateral decubitus  views of the abdomen. No pneumoperitoneum. Stable gas-filled mildly to moderately dilated large bowel since the CT yesterday, most pronounced in the cecum which is estimated at 12-13 centimeters diameter. The administered oral contrast is now retained from the splenic flexure distally. Diverticulosis redemonstrated. Rectal prolapse re- demonstrated. Stable cholecystectomy clips. No acute  osseous abnormality identified. IMPRESSION: 1. Stable gas distended large bowel with passage of all oral contrast administered yesterday to the splenic flexure arguing against obstruction. 2. No free air. 3. Diverticulosis of the large bowel, rectal prolapse. Electronically Signed   By: Genevie Ann M.D.   On: 05/25/2017 09:32   Dg Abd 2 Views  Result Date: 05/24/2017 CLINICAL DATA:  Generalized abdominal pain and distention. EXAM: ABDOMEN - 2 VIEW COMPARISON:  Radiographs May 22, 2017. FINDINGS: No free air is noted to suggest pneumoperitoneum. Air-filled colonic distention is now noted without small bowel dilatation. Status post cholecystectomy. No abnormal calcifications are noted. IMPRESSION: Interval development air-filled colonic distention is noted which may represent ileus. No small bowel dilatation is noted. Electronically Signed   By: Marijo Conception, M.D.   On: 05/24/2017 11:30   Dg Abdomen Acute W/chest  Result Date: 05/22/2017 CLINICAL DATA:  Abdominal pain and distension. Hyperkalemia. History of diabetes. EXAM: DG ABDOMEN ACUTE W/ 1V CHEST COMPARISON:  Chest radiograph January 13, 2016 FINDINGS: Cardiac silhouette is similarly enlarged with pulmonary vascular congestion. Mildly elevated RIGHT hemidiaphragm. No pleural effusion or focal consolidation. No pneumothorax. Soft tissue planes and included osseous structures are nonsuspicious. Bowel gas pattern is nondilated and nonobstructive. No intra-abdominal mass effect. Surgical clips in the included right abdomen compatible with cholecystectomy. No intraperitoneal free air. Phleboliths in the pelvis. Soft tissue planes included osseous structures are nonsuspicious. Large body habitus. IMPRESSION: 1. Mild cardiomegaly and pulmonary vascular congestion. No acute pulmonary process. 2. Normal bowel gas pattern. Electronically Signed   By: Elon Alas M.D.   On: 05/22/2017 18:14    Microbiology: Recent Results (from the past 240 hour(s))  MRSA  PCR Screening     Status: None   Collection Time: 05/23/17  2:17 AM  Result Value Ref Range Status   MRSA by PCR NEGATIVE NEGATIVE Final    Comment:        The GeneXpert MRSA Assay (FDA approved for NASAL specimens only), is one component of a comprehensive MRSA colonization surveillance program. It is not intended to diagnose MRSA infection nor to guide or monitor treatment for MRSA infections. Performed at [facility], [place] [address] [place], [place] [address]   Culture, Urine     Status: Abnormal   Collection Time: 05/23/17  8:50 AM  Result Value Ref Range Status   Specimen Description   Final    URINE, CLEAN CATCH Performed at [facility], [place] [address] [place], [place] [address]    Special Requests   Final    NONE Performed at [facility], [place] [address], [place], [place] [address]    Culture (A)  Final    <10,000 COLONIES/mL INSIGNIFICANT GROWTH Performed at [place] [address], [place], [place] [address]    Report Status 05/24/2017 FINAL  Final     Labs: Basic Metabolic Panel: Recent Labs  Lab 05/26/17 0312 05/27/17 0318 05/28/17 0323 05/29/17 0343 05/30/17 0404 05/31/17 0348 06/01/17 0007  NA 143 136 135 134* 136 136 138  139  K 3.7 3.7 3.7 3.4* 3.6 3.5 3.4*  3.4*  CL 104 99* 95*  93* 93* 92* 95*  96*  CO2 23 26 28 29 30 31 30  30   GLUCOSE 123* 167* 123* 151* 203* 222* 216*  214*  BUN 20 14 13 12 15 19 18  18   CREATININE 1.06* 0.96 1.03* 1.03* 1.20* 1.22* 1.41*  1.38*  CALCIUM 8.1* 8.2* 8.3* 8.5* 8.4* 8.4* 8.3*  8.2*  MG 1.8 2.0  --   --   --   --   --   PHOS 2.7 3.0 2.6 2.9 3.6 4.1 4.0   Liver Function Tests: Recent Labs  Lab 05/28/17 0323 05/29/17 0343 05/30/17 0404 05/31/17 0348 06/01/17 0007  ALBUMIN 2.6* 2.6* 2.6* 3.0* 2.6*   No results for input(s): LIPASE, AMYLASE in the last 168 hours. No results for input(s): AMMONIA in the last 168  hours. CBC: Recent Labs  Lab 05/26/17 0312 05/27/17 0318 05/28/17 0323 05/29/17 0343  WBC 7.1 6.8 5.8 5.4  HGB 9.5* 9.5* 9.6* 9.5*  HCT 30.5* 30.6* 30.7* 30.1*  MCV 70.6* 70.5* 70.3* 69.2*  PLT 182 159 140* 149*   Cardiac Enzymes: No results for input(s): CKTOTAL, CKMB, CKMBINDEX, TROPONINI in the last 168 hours. BNP: BNP (last 3 results) Recent Labs    01/12/17 2018  BNP 83.7    ProBNP (last 3 results) No results for input(s): PROBNP in the last 8760 hours.  CBG: Recent Labs  Lab 05/31/17 1224 05/31/17 1651 05/31/17 2159 06/01/17 0750 06/01/17 1148  GLUCAP 282* 329* 260* 256* 319*    Signed:  Velvet Bathe MD.  [facility] 06/01/2017, 3:14 PM

## 2017-06-05 ENCOUNTER — Other Ambulatory Visit: Payer: Self-pay | Admitting: Endocrinology

## 2017-06-05 DIAGNOSIS — R109 Unspecified abdominal pain: Secondary | ICD-10-CM

## 2017-06-06 DIAGNOSIS — N1330 Unspecified hydronephrosis: Secondary | ICD-10-CM | POA: Diagnosis not present

## 2017-06-06 DIAGNOSIS — N3946 Mixed incontinence: Secondary | ICD-10-CM | POA: Diagnosis not present

## 2017-06-06 DIAGNOSIS — Z8551 Personal history of malignant neoplasm of bladder: Secondary | ICD-10-CM | POA: Diagnosis not present

## 2017-06-07 DIAGNOSIS — K219 Gastro-esophageal reflux disease without esophagitis: Secondary | ICD-10-CM | POA: Diagnosis not present

## 2017-06-07 DIAGNOSIS — L89322 Pressure ulcer of left buttock, stage 2: Secondary | ICD-10-CM | POA: Diagnosis not present

## 2017-06-07 DIAGNOSIS — D649 Anemia, unspecified: Secondary | ICD-10-CM | POA: Diagnosis not present

## 2017-06-07 DIAGNOSIS — I10 Essential (primary) hypertension: Secondary | ICD-10-CM | POA: Diagnosis not present

## 2017-06-07 DIAGNOSIS — N390 Urinary tract infection, site not specified: Secondary | ICD-10-CM | POA: Diagnosis not present

## 2017-06-07 DIAGNOSIS — K5790 Diverticulosis of intestine, part unspecified, without perforation or abscess without bleeding: Secondary | ICD-10-CM | POA: Diagnosis not present

## 2017-06-07 DIAGNOSIS — M170 Bilateral primary osteoarthritis of knee: Secondary | ICD-10-CM | POA: Diagnosis not present

## 2017-06-07 DIAGNOSIS — N139 Obstructive and reflux uropathy, unspecified: Secondary | ICD-10-CM | POA: Diagnosis not present

## 2017-06-07 DIAGNOSIS — E119 Type 2 diabetes mellitus without complications: Secondary | ICD-10-CM | POA: Diagnosis not present

## 2017-06-08 DIAGNOSIS — N139 Obstructive and reflux uropathy, unspecified: Secondary | ICD-10-CM | POA: Diagnosis not present

## 2017-06-08 DIAGNOSIS — K219 Gastro-esophageal reflux disease without esophagitis: Secondary | ICD-10-CM | POA: Diagnosis not present

## 2017-06-08 DIAGNOSIS — M170 Bilateral primary osteoarthritis of knee: Secondary | ICD-10-CM | POA: Diagnosis not present

## 2017-06-08 DIAGNOSIS — I872 Venous insufficiency (chronic) (peripheral): Secondary | ICD-10-CM | POA: Diagnosis not present

## 2017-06-08 DIAGNOSIS — K5790 Diverticulosis of intestine, part unspecified, without perforation or abscess without bleeding: Secondary | ICD-10-CM | POA: Diagnosis not present

## 2017-06-08 DIAGNOSIS — I10 Essential (primary) hypertension: Secondary | ICD-10-CM | POA: Diagnosis not present

## 2017-06-08 DIAGNOSIS — E119 Type 2 diabetes mellitus without complications: Secondary | ICD-10-CM | POA: Diagnosis not present

## 2017-06-08 DIAGNOSIS — Z6841 Body Mass Index (BMI) 40.0 and over, adult: Secondary | ICD-10-CM | POA: Diagnosis not present

## 2017-06-08 DIAGNOSIS — L89322 Pressure ulcer of left buttock, stage 2: Secondary | ICD-10-CM | POA: Diagnosis not present

## 2017-06-08 DIAGNOSIS — R0600 Dyspnea, unspecified: Secondary | ICD-10-CM | POA: Diagnosis not present

## 2017-06-08 DIAGNOSIS — D649 Anemia, unspecified: Secondary | ICD-10-CM | POA: Diagnosis not present

## 2017-06-08 DIAGNOSIS — N390 Urinary tract infection, site not specified: Secondary | ICD-10-CM | POA: Diagnosis not present

## 2017-06-11 DIAGNOSIS — K5790 Diverticulosis of intestine, part unspecified, without perforation or abscess without bleeding: Secondary | ICD-10-CM | POA: Diagnosis not present

## 2017-06-11 DIAGNOSIS — L89322 Pressure ulcer of left buttock, stage 2: Secondary | ICD-10-CM | POA: Diagnosis not present

## 2017-06-11 DIAGNOSIS — D649 Anemia, unspecified: Secondary | ICD-10-CM | POA: Diagnosis not present

## 2017-06-11 DIAGNOSIS — K219 Gastro-esophageal reflux disease without esophagitis: Secondary | ICD-10-CM | POA: Diagnosis not present

## 2017-06-11 DIAGNOSIS — N390 Urinary tract infection, site not specified: Secondary | ICD-10-CM | POA: Diagnosis not present

## 2017-06-11 DIAGNOSIS — N139 Obstructive and reflux uropathy, unspecified: Secondary | ICD-10-CM | POA: Diagnosis not present

## 2017-06-11 DIAGNOSIS — I10 Essential (primary) hypertension: Secondary | ICD-10-CM | POA: Diagnosis not present

## 2017-06-11 DIAGNOSIS — M170 Bilateral primary osteoarthritis of knee: Secondary | ICD-10-CM | POA: Diagnosis not present

## 2017-06-11 DIAGNOSIS — E119 Type 2 diabetes mellitus without complications: Secondary | ICD-10-CM | POA: Diagnosis not present

## 2017-06-12 DIAGNOSIS — K5790 Diverticulosis of intestine, part unspecified, without perforation or abscess without bleeding: Secondary | ICD-10-CM | POA: Diagnosis not present

## 2017-06-12 DIAGNOSIS — M170 Bilateral primary osteoarthritis of knee: Secondary | ICD-10-CM | POA: Diagnosis not present

## 2017-06-12 DIAGNOSIS — L89322 Pressure ulcer of left buttock, stage 2: Secondary | ICD-10-CM | POA: Diagnosis not present

## 2017-06-12 DIAGNOSIS — K219 Gastro-esophageal reflux disease without esophagitis: Secondary | ICD-10-CM | POA: Diagnosis not present

## 2017-06-12 DIAGNOSIS — D649 Anemia, unspecified: Secondary | ICD-10-CM | POA: Diagnosis not present

## 2017-06-12 DIAGNOSIS — N390 Urinary tract infection, site not specified: Secondary | ICD-10-CM | POA: Diagnosis not present

## 2017-06-12 DIAGNOSIS — N139 Obstructive and reflux uropathy, unspecified: Secondary | ICD-10-CM | POA: Diagnosis not present

## 2017-06-12 DIAGNOSIS — I10 Essential (primary) hypertension: Secondary | ICD-10-CM | POA: Diagnosis not present

## 2017-06-12 DIAGNOSIS — E119 Type 2 diabetes mellitus without complications: Secondary | ICD-10-CM | POA: Diagnosis not present

## 2017-06-14 DIAGNOSIS — L89322 Pressure ulcer of left buttock, stage 2: Secondary | ICD-10-CM | POA: Diagnosis not present

## 2017-06-14 DIAGNOSIS — I10 Essential (primary) hypertension: Secondary | ICD-10-CM | POA: Diagnosis not present

## 2017-06-14 DIAGNOSIS — N139 Obstructive and reflux uropathy, unspecified: Secondary | ICD-10-CM | POA: Diagnosis not present

## 2017-06-14 DIAGNOSIS — K5790 Diverticulosis of intestine, part unspecified, without perforation or abscess without bleeding: Secondary | ICD-10-CM | POA: Diagnosis not present

## 2017-06-14 DIAGNOSIS — K219 Gastro-esophageal reflux disease without esophagitis: Secondary | ICD-10-CM | POA: Diagnosis not present

## 2017-06-14 DIAGNOSIS — M170 Bilateral primary osteoarthritis of knee: Secondary | ICD-10-CM | POA: Diagnosis not present

## 2017-06-14 DIAGNOSIS — E119 Type 2 diabetes mellitus without complications: Secondary | ICD-10-CM | POA: Diagnosis not present

## 2017-06-14 DIAGNOSIS — D649 Anemia, unspecified: Secondary | ICD-10-CM | POA: Diagnosis not present

## 2017-06-14 DIAGNOSIS — N390 Urinary tract infection, site not specified: Secondary | ICD-10-CM | POA: Diagnosis not present

## 2017-06-18 DIAGNOSIS — C44311 Basal cell carcinoma of skin of nose: Secondary | ICD-10-CM | POA: Diagnosis not present

## 2017-06-19 DIAGNOSIS — N139 Obstructive and reflux uropathy, unspecified: Secondary | ICD-10-CM | POA: Diagnosis not present

## 2017-06-19 DIAGNOSIS — N390 Urinary tract infection, site not specified: Secondary | ICD-10-CM | POA: Diagnosis not present

## 2017-06-19 DIAGNOSIS — K5790 Diverticulosis of intestine, part unspecified, without perforation or abscess without bleeding: Secondary | ICD-10-CM | POA: Diagnosis not present

## 2017-06-19 DIAGNOSIS — D649 Anemia, unspecified: Secondary | ICD-10-CM | POA: Diagnosis not present

## 2017-06-19 DIAGNOSIS — E119 Type 2 diabetes mellitus without complications: Secondary | ICD-10-CM | POA: Diagnosis not present

## 2017-06-19 DIAGNOSIS — I10 Essential (primary) hypertension: Secondary | ICD-10-CM | POA: Diagnosis not present

## 2017-06-19 DIAGNOSIS — K219 Gastro-esophageal reflux disease without esophagitis: Secondary | ICD-10-CM | POA: Diagnosis not present

## 2017-06-19 DIAGNOSIS — M170 Bilateral primary osteoarthritis of knee: Secondary | ICD-10-CM | POA: Diagnosis not present

## 2017-06-19 DIAGNOSIS — L89322 Pressure ulcer of left buttock, stage 2: Secondary | ICD-10-CM | POA: Diagnosis not present

## 2017-06-21 DIAGNOSIS — K5790 Diverticulosis of intestine, part unspecified, without perforation or abscess without bleeding: Secondary | ICD-10-CM | POA: Diagnosis not present

## 2017-06-21 DIAGNOSIS — N390 Urinary tract infection, site not specified: Secondary | ICD-10-CM | POA: Diagnosis not present

## 2017-06-21 DIAGNOSIS — N139 Obstructive and reflux uropathy, unspecified: Secondary | ICD-10-CM | POA: Diagnosis not present

## 2017-06-21 DIAGNOSIS — K219 Gastro-esophageal reflux disease without esophagitis: Secondary | ICD-10-CM | POA: Diagnosis not present

## 2017-06-21 DIAGNOSIS — L89322 Pressure ulcer of left buttock, stage 2: Secondary | ICD-10-CM | POA: Diagnosis not present

## 2017-06-21 DIAGNOSIS — D649 Anemia, unspecified: Secondary | ICD-10-CM | POA: Diagnosis not present

## 2017-06-21 DIAGNOSIS — M170 Bilateral primary osteoarthritis of knee: Secondary | ICD-10-CM | POA: Diagnosis not present

## 2017-06-21 DIAGNOSIS — I10 Essential (primary) hypertension: Secondary | ICD-10-CM | POA: Diagnosis not present

## 2017-06-21 DIAGNOSIS — E119 Type 2 diabetes mellitus without complications: Secondary | ICD-10-CM | POA: Diagnosis not present

## 2017-06-26 DIAGNOSIS — I10 Essential (primary) hypertension: Secondary | ICD-10-CM | POA: Diagnosis not present

## 2017-06-26 DIAGNOSIS — D649 Anemia, unspecified: Secondary | ICD-10-CM | POA: Diagnosis not present

## 2017-06-26 DIAGNOSIS — N139 Obstructive and reflux uropathy, unspecified: Secondary | ICD-10-CM | POA: Diagnosis not present

## 2017-06-26 DIAGNOSIS — K219 Gastro-esophageal reflux disease without esophagitis: Secondary | ICD-10-CM | POA: Diagnosis not present

## 2017-06-26 DIAGNOSIS — M170 Bilateral primary osteoarthritis of knee: Secondary | ICD-10-CM | POA: Diagnosis not present

## 2017-06-26 DIAGNOSIS — E119 Type 2 diabetes mellitus without complications: Secondary | ICD-10-CM | POA: Diagnosis not present

## 2017-06-26 DIAGNOSIS — L89322 Pressure ulcer of left buttock, stage 2: Secondary | ICD-10-CM | POA: Diagnosis not present

## 2017-06-26 DIAGNOSIS — N390 Urinary tract infection, site not specified: Secondary | ICD-10-CM | POA: Diagnosis not present

## 2017-06-26 DIAGNOSIS — K5790 Diverticulosis of intestine, part unspecified, without perforation or abscess without bleeding: Secondary | ICD-10-CM | POA: Diagnosis not present

## 2017-06-28 DIAGNOSIS — K219 Gastro-esophageal reflux disease without esophagitis: Secondary | ICD-10-CM | POA: Diagnosis not present

## 2017-06-28 DIAGNOSIS — N390 Urinary tract infection, site not specified: Secondary | ICD-10-CM | POA: Diagnosis not present

## 2017-06-28 DIAGNOSIS — N139 Obstructive and reflux uropathy, unspecified: Secondary | ICD-10-CM | POA: Diagnosis not present

## 2017-06-28 DIAGNOSIS — E119 Type 2 diabetes mellitus without complications: Secondary | ICD-10-CM | POA: Diagnosis not present

## 2017-06-28 DIAGNOSIS — I10 Essential (primary) hypertension: Secondary | ICD-10-CM | POA: Diagnosis not present

## 2017-06-28 DIAGNOSIS — L89322 Pressure ulcer of left buttock, stage 2: Secondary | ICD-10-CM | POA: Diagnosis not present

## 2017-06-28 DIAGNOSIS — D649 Anemia, unspecified: Secondary | ICD-10-CM | POA: Diagnosis not present

## 2017-06-28 DIAGNOSIS — K5790 Diverticulosis of intestine, part unspecified, without perforation or abscess without bleeding: Secondary | ICD-10-CM | POA: Diagnosis not present

## 2017-06-28 DIAGNOSIS — M170 Bilateral primary osteoarthritis of knee: Secondary | ICD-10-CM | POA: Diagnosis not present

## 2017-06-29 DIAGNOSIS — D649 Anemia, unspecified: Secondary | ICD-10-CM | POA: Diagnosis not present

## 2017-06-29 DIAGNOSIS — K5790 Diverticulosis of intestine, part unspecified, without perforation or abscess without bleeding: Secondary | ICD-10-CM | POA: Diagnosis not present

## 2017-06-29 DIAGNOSIS — L89322 Pressure ulcer of left buttock, stage 2: Secondary | ICD-10-CM | POA: Diagnosis not present

## 2017-06-29 DIAGNOSIS — E119 Type 2 diabetes mellitus without complications: Secondary | ICD-10-CM | POA: Diagnosis not present

## 2017-06-29 DIAGNOSIS — M170 Bilateral primary osteoarthritis of knee: Secondary | ICD-10-CM | POA: Diagnosis not present

## 2017-06-29 DIAGNOSIS — N390 Urinary tract infection, site not specified: Secondary | ICD-10-CM | POA: Diagnosis not present

## 2017-06-29 DIAGNOSIS — K219 Gastro-esophageal reflux disease without esophagitis: Secondary | ICD-10-CM | POA: Diagnosis not present

## 2017-06-29 DIAGNOSIS — N139 Obstructive and reflux uropathy, unspecified: Secondary | ICD-10-CM | POA: Diagnosis not present

## 2017-06-29 DIAGNOSIS — I10 Essential (primary) hypertension: Secondary | ICD-10-CM | POA: Diagnosis not present

## 2017-07-10 DIAGNOSIS — N390 Urinary tract infection, site not specified: Secondary | ICD-10-CM | POA: Diagnosis not present

## 2017-07-10 DIAGNOSIS — L89322 Pressure ulcer of left buttock, stage 2: Secondary | ICD-10-CM | POA: Diagnosis not present

## 2017-07-10 DIAGNOSIS — K219 Gastro-esophageal reflux disease without esophagitis: Secondary | ICD-10-CM | POA: Diagnosis not present

## 2017-07-10 DIAGNOSIS — D649 Anemia, unspecified: Secondary | ICD-10-CM | POA: Diagnosis not present

## 2017-07-10 DIAGNOSIS — I10 Essential (primary) hypertension: Secondary | ICD-10-CM | POA: Diagnosis not present

## 2017-07-10 DIAGNOSIS — K5790 Diverticulosis of intestine, part unspecified, without perforation or abscess without bleeding: Secondary | ICD-10-CM | POA: Diagnosis not present

## 2017-07-10 DIAGNOSIS — M170 Bilateral primary osteoarthritis of knee: Secondary | ICD-10-CM | POA: Diagnosis not present

## 2017-07-10 DIAGNOSIS — N139 Obstructive and reflux uropathy, unspecified: Secondary | ICD-10-CM | POA: Diagnosis not present

## 2017-07-10 DIAGNOSIS — E119 Type 2 diabetes mellitus without complications: Secondary | ICD-10-CM | POA: Diagnosis not present

## 2017-07-13 DIAGNOSIS — N390 Urinary tract infection, site not specified: Secondary | ICD-10-CM | POA: Diagnosis not present

## 2017-07-13 DIAGNOSIS — K219 Gastro-esophageal reflux disease without esophagitis: Secondary | ICD-10-CM | POA: Diagnosis not present

## 2017-07-13 DIAGNOSIS — K5790 Diverticulosis of intestine, part unspecified, without perforation or abscess without bleeding: Secondary | ICD-10-CM | POA: Diagnosis not present

## 2017-07-13 DIAGNOSIS — I10 Essential (primary) hypertension: Secondary | ICD-10-CM | POA: Diagnosis not present

## 2017-07-13 DIAGNOSIS — E119 Type 2 diabetes mellitus without complications: Secondary | ICD-10-CM | POA: Diagnosis not present

## 2017-07-13 DIAGNOSIS — D649 Anemia, unspecified: Secondary | ICD-10-CM | POA: Diagnosis not present

## 2017-07-13 DIAGNOSIS — N139 Obstructive and reflux uropathy, unspecified: Secondary | ICD-10-CM | POA: Diagnosis not present

## 2017-07-13 DIAGNOSIS — L89322 Pressure ulcer of left buttock, stage 2: Secondary | ICD-10-CM | POA: Diagnosis not present

## 2017-07-13 DIAGNOSIS — M170 Bilateral primary osteoarthritis of knee: Secondary | ICD-10-CM | POA: Diagnosis not present

## 2017-07-17 DIAGNOSIS — E119 Type 2 diabetes mellitus without complications: Secondary | ICD-10-CM | POA: Diagnosis not present

## 2017-07-17 DIAGNOSIS — M170 Bilateral primary osteoarthritis of knee: Secondary | ICD-10-CM | POA: Diagnosis not present

## 2017-07-17 DIAGNOSIS — I10 Essential (primary) hypertension: Secondary | ICD-10-CM | POA: Diagnosis not present

## 2017-07-17 DIAGNOSIS — K219 Gastro-esophageal reflux disease without esophagitis: Secondary | ICD-10-CM | POA: Diagnosis not present

## 2017-07-17 DIAGNOSIS — D649 Anemia, unspecified: Secondary | ICD-10-CM | POA: Diagnosis not present

## 2017-07-17 DIAGNOSIS — L89322 Pressure ulcer of left buttock, stage 2: Secondary | ICD-10-CM | POA: Diagnosis not present

## 2017-07-17 DIAGNOSIS — N139 Obstructive and reflux uropathy, unspecified: Secondary | ICD-10-CM | POA: Diagnosis not present

## 2017-07-17 DIAGNOSIS — K5790 Diverticulosis of intestine, part unspecified, without perforation or abscess without bleeding: Secondary | ICD-10-CM | POA: Diagnosis not present

## 2017-07-17 DIAGNOSIS — N390 Urinary tract infection, site not specified: Secondary | ICD-10-CM | POA: Diagnosis not present

## 2017-07-19 DIAGNOSIS — K5790 Diverticulosis of intestine, part unspecified, without perforation or abscess without bleeding: Secondary | ICD-10-CM | POA: Diagnosis not present

## 2017-07-19 DIAGNOSIS — N139 Obstructive and reflux uropathy, unspecified: Secondary | ICD-10-CM | POA: Diagnosis not present

## 2017-07-19 DIAGNOSIS — M170 Bilateral primary osteoarthritis of knee: Secondary | ICD-10-CM | POA: Diagnosis not present

## 2017-07-19 DIAGNOSIS — E119 Type 2 diabetes mellitus without complications: Secondary | ICD-10-CM | POA: Diagnosis not present

## 2017-07-19 DIAGNOSIS — N390 Urinary tract infection, site not specified: Secondary | ICD-10-CM | POA: Diagnosis not present

## 2017-07-19 DIAGNOSIS — D649 Anemia, unspecified: Secondary | ICD-10-CM | POA: Diagnosis not present

## 2017-07-19 DIAGNOSIS — I10 Essential (primary) hypertension: Secondary | ICD-10-CM | POA: Diagnosis not present

## 2017-07-19 DIAGNOSIS — K219 Gastro-esophageal reflux disease without esophagitis: Secondary | ICD-10-CM | POA: Diagnosis not present

## 2017-07-19 DIAGNOSIS — L89322 Pressure ulcer of left buttock, stage 2: Secondary | ICD-10-CM | POA: Diagnosis not present

## 2017-07-24 DIAGNOSIS — N390 Urinary tract infection, site not specified: Secondary | ICD-10-CM | POA: Diagnosis not present

## 2017-07-24 DIAGNOSIS — K5790 Diverticulosis of intestine, part unspecified, without perforation or abscess without bleeding: Secondary | ICD-10-CM | POA: Diagnosis not present

## 2017-07-24 DIAGNOSIS — D649 Anemia, unspecified: Secondary | ICD-10-CM | POA: Diagnosis not present

## 2017-07-24 DIAGNOSIS — N139 Obstructive and reflux uropathy, unspecified: Secondary | ICD-10-CM | POA: Diagnosis not present

## 2017-07-24 DIAGNOSIS — K219 Gastro-esophageal reflux disease without esophagitis: Secondary | ICD-10-CM | POA: Diagnosis not present

## 2017-07-24 DIAGNOSIS — E119 Type 2 diabetes mellitus without complications: Secondary | ICD-10-CM | POA: Diagnosis not present

## 2017-07-24 DIAGNOSIS — L89322 Pressure ulcer of left buttock, stage 2: Secondary | ICD-10-CM | POA: Diagnosis not present

## 2017-07-24 DIAGNOSIS — I10 Essential (primary) hypertension: Secondary | ICD-10-CM | POA: Diagnosis not present

## 2017-07-24 DIAGNOSIS — M170 Bilateral primary osteoarthritis of knee: Secondary | ICD-10-CM | POA: Diagnosis not present

## 2017-07-25 DIAGNOSIS — N3946 Mixed incontinence: Secondary | ICD-10-CM | POA: Diagnosis not present

## 2017-07-25 DIAGNOSIS — Z8551 Personal history of malignant neoplasm of bladder: Secondary | ICD-10-CM | POA: Diagnosis not present

## 2017-07-25 DIAGNOSIS — N1330 Unspecified hydronephrosis: Secondary | ICD-10-CM | POA: Diagnosis not present

## 2017-07-25 DIAGNOSIS — R8271 Bacteriuria: Secondary | ICD-10-CM | POA: Diagnosis not present

## 2017-07-26 DIAGNOSIS — L89322 Pressure ulcer of left buttock, stage 2: Secondary | ICD-10-CM | POA: Diagnosis not present

## 2017-07-26 DIAGNOSIS — D649 Anemia, unspecified: Secondary | ICD-10-CM | POA: Diagnosis not present

## 2017-07-26 DIAGNOSIS — N390 Urinary tract infection, site not specified: Secondary | ICD-10-CM | POA: Diagnosis not present

## 2017-07-26 DIAGNOSIS — N139 Obstructive and reflux uropathy, unspecified: Secondary | ICD-10-CM | POA: Diagnosis not present

## 2017-07-26 DIAGNOSIS — I10 Essential (primary) hypertension: Secondary | ICD-10-CM | POA: Diagnosis not present

## 2017-07-26 DIAGNOSIS — M170 Bilateral primary osteoarthritis of knee: Secondary | ICD-10-CM | POA: Diagnosis not present

## 2017-07-26 DIAGNOSIS — E119 Type 2 diabetes mellitus without complications: Secondary | ICD-10-CM | POA: Diagnosis not present

## 2017-07-26 DIAGNOSIS — K5790 Diverticulosis of intestine, part unspecified, without perforation or abscess without bleeding: Secondary | ICD-10-CM | POA: Diagnosis not present

## 2017-07-26 DIAGNOSIS — K219 Gastro-esophageal reflux disease without esophagitis: Secondary | ICD-10-CM | POA: Diagnosis not present

## 2017-08-01 DIAGNOSIS — K5790 Diverticulosis of intestine, part unspecified, without perforation or abscess without bleeding: Secondary | ICD-10-CM | POA: Diagnosis not present

## 2017-08-01 DIAGNOSIS — D649 Anemia, unspecified: Secondary | ICD-10-CM | POA: Diagnosis not present

## 2017-08-01 DIAGNOSIS — M170 Bilateral primary osteoarthritis of knee: Secondary | ICD-10-CM | POA: Diagnosis not present

## 2017-08-01 DIAGNOSIS — L89322 Pressure ulcer of left buttock, stage 2: Secondary | ICD-10-CM | POA: Diagnosis not present

## 2017-08-01 DIAGNOSIS — N139 Obstructive and reflux uropathy, unspecified: Secondary | ICD-10-CM | POA: Diagnosis not present

## 2017-08-01 DIAGNOSIS — K219 Gastro-esophageal reflux disease without esophagitis: Secondary | ICD-10-CM | POA: Diagnosis not present

## 2017-08-01 DIAGNOSIS — N390 Urinary tract infection, site not specified: Secondary | ICD-10-CM | POA: Diagnosis not present

## 2017-08-01 DIAGNOSIS — I10 Essential (primary) hypertension: Secondary | ICD-10-CM | POA: Diagnosis not present

## 2017-08-01 DIAGNOSIS — E119 Type 2 diabetes mellitus without complications: Secondary | ICD-10-CM | POA: Diagnosis not present

## 2017-08-13 DIAGNOSIS — M1000 Idiopathic gout, unspecified site: Secondary | ICD-10-CM | POA: Diagnosis not present

## 2017-08-13 DIAGNOSIS — E11319 Type 2 diabetes mellitus with unspecified diabetic retinopathy without macular edema: Secondary | ICD-10-CM | POA: Diagnosis not present

## 2017-08-13 DIAGNOSIS — Z96659 Presence of unspecified artificial knee joint: Secondary | ICD-10-CM | POA: Diagnosis not present

## 2017-08-13 DIAGNOSIS — D6489 Other specified anemias: Secondary | ICD-10-CM | POA: Diagnosis not present

## 2017-08-13 DIAGNOSIS — D649 Anemia, unspecified: Secondary | ICD-10-CM | POA: Diagnosis not present

## 2017-08-13 DIAGNOSIS — N281 Cyst of kidney, acquired: Secondary | ICD-10-CM | POA: Diagnosis not present

## 2017-08-13 DIAGNOSIS — E1129 Type 2 diabetes mellitus with other diabetic kidney complication: Secondary | ICD-10-CM | POA: Diagnosis not present

## 2017-08-13 DIAGNOSIS — N184 Chronic kidney disease, stage 4 (severe): Secondary | ICD-10-CM | POA: Diagnosis not present

## 2017-08-13 DIAGNOSIS — E048 Other specified nontoxic goiter: Secondary | ICD-10-CM | POA: Diagnosis not present

## 2017-08-13 DIAGNOSIS — I10 Essential (primary) hypertension: Secondary | ICD-10-CM | POA: Diagnosis not present

## 2017-09-27 DIAGNOSIS — K649 Unspecified hemorrhoids: Secondary | ICD-10-CM | POA: Diagnosis not present

## 2017-09-27 DIAGNOSIS — D649 Anemia, unspecified: Secondary | ICD-10-CM | POA: Diagnosis not present

## 2017-09-27 DIAGNOSIS — D563 Thalassemia minor: Secondary | ICD-10-CM | POA: Diagnosis not present

## 2017-09-27 DIAGNOSIS — J449 Chronic obstructive pulmonary disease, unspecified: Secondary | ICD-10-CM | POA: Diagnosis not present

## 2017-09-27 DIAGNOSIS — N189 Chronic kidney disease, unspecified: Secondary | ICD-10-CM | POA: Diagnosis not present

## 2017-09-27 DIAGNOSIS — R7989 Other specified abnormal findings of blood chemistry: Secondary | ICD-10-CM | POA: Diagnosis not present

## 2017-09-27 DIAGNOSIS — I129 Hypertensive chronic kidney disease with stage 1 through stage 4 chronic kidney disease, or unspecified chronic kidney disease: Secondary | ICD-10-CM | POA: Diagnosis not present

## 2017-09-27 DIAGNOSIS — K625 Hemorrhage of anus and rectum: Secondary | ICD-10-CM | POA: Diagnosis not present

## 2017-10-05 DIAGNOSIS — H353131 Nonexudative age-related macular degeneration, bilateral, early dry stage: Secondary | ICD-10-CM | POA: Diagnosis not present

## 2017-10-05 DIAGNOSIS — H5210 Myopia, unspecified eye: Secondary | ICD-10-CM | POA: Diagnosis not present

## 2017-10-30 ENCOUNTER — Emergency Department (HOSPITAL_COMMUNITY): Payer: Medicare HMO

## 2017-10-30 ENCOUNTER — Encounter (HOSPITAL_COMMUNITY): Payer: Self-pay

## 2017-10-30 ENCOUNTER — Observation Stay (HOSPITAL_COMMUNITY): Payer: Medicare HMO

## 2017-10-30 ENCOUNTER — Other Ambulatory Visit: Payer: Self-pay

## 2017-10-30 ENCOUNTER — Inpatient Hospital Stay (HOSPITAL_COMMUNITY)
Admission: EM | Admit: 2017-10-30 | Discharge: 2017-11-16 | DRG: 871 | Disposition: A | Discharge status: Skilled Nursing Facility | Payer: Medicare HMO | Attending: Internal Medicine | Admitting: Internal Medicine

## 2017-10-30 DIAGNOSIS — K5900 Constipation, unspecified: Secondary | ICD-10-CM | POA: Diagnosis present

## 2017-10-30 DIAGNOSIS — E878 Other disorders of electrolyte and fluid balance, not elsewhere classified: Secondary | ICD-10-CM | POA: Diagnosis present

## 2017-10-30 DIAGNOSIS — M545 Low back pain: Secondary | ICD-10-CM | POA: Diagnosis present

## 2017-10-30 DIAGNOSIS — I132 Hypertensive heart and chronic kidney disease with heart failure and with stage 5 chronic kidney disease, or end stage renal disease: Secondary | ICD-10-CM | POA: Diagnosis present

## 2017-10-30 DIAGNOSIS — A419 Sepsis, unspecified organism: Secondary | ICD-10-CM | POA: Diagnosis present

## 2017-10-30 DIAGNOSIS — F05 Delirium due to known physiological condition: Secondary | ICD-10-CM | POA: Diagnosis present

## 2017-10-30 DIAGNOSIS — M109 Gout, unspecified: Secondary | ICD-10-CM | POA: Diagnosis present

## 2017-10-30 DIAGNOSIS — Z96651 Presence of right artificial knee joint: Secondary | ICD-10-CM | POA: Diagnosis present

## 2017-10-30 DIAGNOSIS — N186 End stage renal disease: Secondary | ICD-10-CM | POA: Diagnosis present

## 2017-10-30 DIAGNOSIS — K219 Gastro-esophageal reflux disease without esophagitis: Secondary | ICD-10-CM | POA: Diagnosis present

## 2017-10-30 DIAGNOSIS — E785 Hyperlipidemia, unspecified: Secondary | ICD-10-CM | POA: Diagnosis present

## 2017-10-30 DIAGNOSIS — Z8719 Personal history of other diseases of the digestive system: Secondary | ICD-10-CM

## 2017-10-30 DIAGNOSIS — R0602 Shortness of breath: Secondary | ICD-10-CM | POA: Diagnosis not present

## 2017-10-30 DIAGNOSIS — N183 Chronic kidney disease, stage 3 (moderate): Secondary | ICD-10-CM | POA: Diagnosis not present

## 2017-10-30 DIAGNOSIS — E871 Hypo-osmolality and hyponatremia: Secondary | ICD-10-CM | POA: Diagnosis present

## 2017-10-30 DIAGNOSIS — R404 Transient alteration of awareness: Secondary | ICD-10-CM | POA: Diagnosis not present

## 2017-10-30 DIAGNOSIS — Z4682 Encounter for fitting and adjustment of non-vascular catheter: Secondary | ICD-10-CM | POA: Diagnosis not present

## 2017-10-30 DIAGNOSIS — R32 Unspecified urinary incontinence: Secondary | ICD-10-CM | POA: Diagnosis present

## 2017-10-30 DIAGNOSIS — N179 Acute kidney failure, unspecified: Secondary | ICD-10-CM | POA: Diagnosis not present

## 2017-10-30 DIAGNOSIS — K529 Noninfective gastroenteritis and colitis, unspecified: Secondary | ICD-10-CM

## 2017-10-30 DIAGNOSIS — Z6841 Body Mass Index (BMI) 40.0 and over, adult: Secondary | ICD-10-CM

## 2017-10-30 DIAGNOSIS — R579 Shock, unspecified: Secondary | ICD-10-CM | POA: Diagnosis not present

## 2017-10-30 DIAGNOSIS — G9340 Encephalopathy, unspecified: Secondary | ICD-10-CM

## 2017-10-30 DIAGNOSIS — Z833 Family history of diabetes mellitus: Secondary | ICD-10-CM

## 2017-10-30 DIAGNOSIS — N170 Acute kidney failure with tubular necrosis: Secondary | ICD-10-CM | POA: Diagnosis present

## 2017-10-30 DIAGNOSIS — R2689 Other abnormalities of gait and mobility: Secondary | ICD-10-CM | POA: Diagnosis not present

## 2017-10-30 DIAGNOSIS — Z515 Encounter for palliative care: Secondary | ICD-10-CM | POA: Diagnosis not present

## 2017-10-30 DIAGNOSIS — Z7401 Bed confinement status: Secondary | ICD-10-CM

## 2017-10-30 DIAGNOSIS — J9690 Respiratory failure, unspecified, unspecified whether with hypoxia or hypercapnia: Secondary | ICD-10-CM | POA: Diagnosis not present

## 2017-10-30 DIAGNOSIS — R0600 Dyspnea, unspecified: Secondary | ICD-10-CM

## 2017-10-30 DIAGNOSIS — Z79899 Other long term (current) drug therapy: Secondary | ICD-10-CM

## 2017-10-30 DIAGNOSIS — K5730 Diverticulosis of large intestine without perforation or abscess without bleeding: Secondary | ICD-10-CM | POA: Diagnosis not present

## 2017-10-30 DIAGNOSIS — N184 Chronic kidney disease, stage 4 (severe): Secondary | ICD-10-CM

## 2017-10-30 DIAGNOSIS — G92 Toxic encephalopathy: Secondary | ICD-10-CM | POA: Diagnosis present

## 2017-10-30 DIAGNOSIS — R4182 Altered mental status, unspecified: Secondary | ICD-10-CM | POA: Diagnosis not present

## 2017-10-30 DIAGNOSIS — R109 Unspecified abdominal pain: Secondary | ICD-10-CM | POA: Diagnosis present

## 2017-10-30 DIAGNOSIS — R6521 Severe sepsis with septic shock: Secondary | ICD-10-CM | POA: Diagnosis present

## 2017-10-30 DIAGNOSIS — Z992 Dependence on renal dialysis: Secondary | ICD-10-CM

## 2017-10-30 DIAGNOSIS — R1312 Dysphagia, oropharyngeal phase: Secondary | ICD-10-CM | POA: Diagnosis not present

## 2017-10-30 DIAGNOSIS — J810 Acute pulmonary edema: Secondary | ICD-10-CM | POA: Diagnosis not present

## 2017-10-30 DIAGNOSIS — E872 Acidosis, unspecified: Secondary | ICD-10-CM | POA: Diagnosis present

## 2017-10-30 DIAGNOSIS — D509 Iron deficiency anemia, unspecified: Secondary | ICD-10-CM | POA: Diagnosis present

## 2017-10-30 DIAGNOSIS — Z452 Encounter for adjustment and management of vascular access device: Secondary | ICD-10-CM | POA: Diagnosis not present

## 2017-10-30 DIAGNOSIS — M6281 Muscle weakness (generalized): Secondary | ICD-10-CM | POA: Diagnosis not present

## 2017-10-30 DIAGNOSIS — Z86010 Personal history of colonic polyps: Secondary | ICD-10-CM

## 2017-10-30 DIAGNOSIS — Z8249 Family history of ischemic heart disease and other diseases of the circulatory system: Secondary | ICD-10-CM

## 2017-10-30 DIAGNOSIS — R0603 Acute respiratory distress: Secondary | ICD-10-CM | POA: Diagnosis not present

## 2017-10-30 DIAGNOSIS — J9601 Acute respiratory failure with hypoxia: Secondary | ICD-10-CM | POA: Diagnosis not present

## 2017-10-30 DIAGNOSIS — I5032 Chronic diastolic (congestive) heart failure: Secondary | ICD-10-CM | POA: Diagnosis present

## 2017-10-30 DIAGNOSIS — E1129 Type 2 diabetes mellitus with other diabetic kidney complication: Secondary | ICD-10-CM | POA: Diagnosis not present

## 2017-10-30 DIAGNOSIS — R0682 Tachypnea, not elsewhere classified: Secondary | ICD-10-CM

## 2017-10-30 DIAGNOSIS — M2550 Pain in unspecified joint: Secondary | ICD-10-CM | POA: Diagnosis not present

## 2017-10-30 DIAGNOSIS — E662 Morbid (severe) obesity with alveolar hypoventilation: Secondary | ICD-10-CM | POA: Diagnosis present

## 2017-10-30 DIAGNOSIS — Z66 Do not resuscitate: Secondary | ICD-10-CM | POA: Diagnosis present

## 2017-10-30 DIAGNOSIS — D638 Anemia in other chronic diseases classified elsewhere: Secondary | ICD-10-CM | POA: Diagnosis present

## 2017-10-30 DIAGNOSIS — R1310 Dysphagia, unspecified: Secondary | ICD-10-CM | POA: Diagnosis present

## 2017-10-30 DIAGNOSIS — Z7189 Other specified counseling: Secondary | ICD-10-CM | POA: Diagnosis not present

## 2017-10-30 DIAGNOSIS — E1165 Type 2 diabetes mellitus with hyperglycemia: Secondary | ICD-10-CM | POA: Diagnosis not present

## 2017-10-30 DIAGNOSIS — N171 Acute kidney failure with acute cortical necrosis: Secondary | ICD-10-CM | POA: Diagnosis not present

## 2017-10-30 DIAGNOSIS — M170 Bilateral primary osteoarthritis of knee: Secondary | ICD-10-CM | POA: Diagnosis present

## 2017-10-30 DIAGNOSIS — Z794 Long term (current) use of insulin: Secondary | ICD-10-CM | POA: Diagnosis not present

## 2017-10-30 DIAGNOSIS — R531 Weakness: Secondary | ICD-10-CM | POA: Diagnosis not present

## 2017-10-30 DIAGNOSIS — E43 Unspecified severe protein-calorie malnutrition: Secondary | ICD-10-CM | POA: Diagnosis present

## 2017-10-30 DIAGNOSIS — I10 Essential (primary) hypertension: Secondary | ICD-10-CM | POA: Diagnosis not present

## 2017-10-30 DIAGNOSIS — Z880 Allergy status to penicillin: Secondary | ICD-10-CM

## 2017-10-30 DIAGNOSIS — I129 Hypertensive chronic kidney disease with stage 1 through stage 4 chronic kidney disease, or unspecified chronic kidney disease: Secondary | ICD-10-CM | POA: Diagnosis not present

## 2017-10-30 DIAGNOSIS — A0472 Enterocolitis due to Clostridium difficile, not specified as recurrent: Secondary | ICD-10-CM | POA: Diagnosis present

## 2017-10-30 DIAGNOSIS — Z4659 Encounter for fitting and adjustment of other gastrointestinal appliance and device: Secondary | ICD-10-CM

## 2017-10-30 DIAGNOSIS — I69828 Other speech and language deficits following other cerebrovascular disease: Secondary | ICD-10-CM | POA: Diagnosis not present

## 2017-10-30 DIAGNOSIS — Z0189 Encounter for other specified special examinations: Secondary | ICD-10-CM

## 2017-10-30 DIAGNOSIS — R140 Abdominal distension (gaseous): Secondary | ICD-10-CM

## 2017-10-30 DIAGNOSIS — G9341 Metabolic encephalopathy: Secondary | ICD-10-CM

## 2017-10-30 DIAGNOSIS — R410 Disorientation, unspecified: Secondary | ICD-10-CM | POA: Diagnosis not present

## 2017-10-30 DIAGNOSIS — R197 Diarrhea, unspecified: Secondary | ICD-10-CM | POA: Diagnosis not present

## 2017-10-30 DIAGNOSIS — Z4901 Encounter for fitting and adjustment of extracorporeal dialysis catheter: Secondary | ICD-10-CM | POA: Diagnosis not present

## 2017-10-30 DIAGNOSIS — E1321 Other specified diabetes mellitus with diabetic nephropathy: Secondary | ICD-10-CM | POA: Diagnosis present

## 2017-10-30 LAB — CBC WITH DIFFERENTIAL/PLATELET
Abs Immature Granulocytes: 0.06 10*3/uL (ref 0.00–0.07)
BASOS ABS: 0 10*3/uL (ref 0.0–0.1)
Basophils Relative: 0 %
EOS ABS: 0 10*3/uL (ref 0.0–0.5)
EOS PCT: 0 %
HCT: 27.2 % — ABNORMAL LOW (ref 36.0–46.0)
Hemoglobin: 8.2 g/dL — ABNORMAL LOW (ref 12.0–15.0)
Immature Granulocytes: 1 %
Lymphocytes Relative: 11 %
Lymphs Abs: 1.2 10*3/uL (ref 0.7–4.0)
MCH: 20.3 pg — AB (ref 26.0–34.0)
MCHC: 30.1 g/dL (ref 30.0–36.0)
MCV: 67.3 fL — AB (ref 80.0–100.0)
Monocytes Absolute: 1 10*3/uL (ref 0.1–1.0)
Monocytes Relative: 10 %
NRBC: 0.2 % (ref 0.0–0.2)
Neutro Abs: 8.3 10*3/uL — ABNORMAL HIGH (ref 1.7–7.7)
Neutrophils Relative %: 78 %
PLATELETS: 264 10*3/uL (ref 150–400)
RBC: 4.04 MIL/uL (ref 3.87–5.11)
RDW: 16.3 % — AB (ref 11.5–15.5)
WBC: 10.5 10*3/uL (ref 4.0–10.5)

## 2017-10-30 LAB — COMPREHENSIVE METABOLIC PANEL
ALT: 24 U/L (ref 0–44)
AST: 28 U/L (ref 15–41)
Albumin: 2.9 g/dL — ABNORMAL LOW (ref 3.5–5.0)
Alkaline Phosphatase: 151 U/L — ABNORMAL HIGH (ref 38–126)
Anion gap: 12 (ref 5–15)
BUN: 81 mg/dL — ABNORMAL HIGH (ref 8–23)
CHLORIDE: 101 mmol/L (ref 98–111)
CO2: 18 mmol/L — ABNORMAL LOW (ref 22–32)
CREATININE: 3.96 mg/dL — AB (ref 0.44–1.00)
Calcium: 8.2 mg/dL — ABNORMAL LOW (ref 8.9–10.3)
GFR, EST AFRICAN AMERICAN: 11 mL/min — AB (ref >60)
GFR, EST NON AFRICAN AMERICAN: 10 mL/min — AB (ref >60)
Glucose, Bld: 148 mg/dL — ABNORMAL HIGH (ref 70–99)
POTASSIUM: 4.5 mmol/L (ref 3.5–5.1)
Sodium: 131 mmol/L — ABNORMAL LOW (ref 135–145)
Total Bilirubin: 0.5 mg/dL (ref 0.3–1.2)
Total Protein: 7.4 g/dL (ref 6.5–8.1)

## 2017-10-30 LAB — C DIFFICILE QUICK SCREEN W PCR REFLEX
C DIFFICLE (CDIFF) ANTIGEN: POSITIVE — AB
C Diff toxin: NEGATIVE

## 2017-10-30 LAB — CLOSTRIDIUM DIFFICILE BY PCR, REFLEXED: CDIFFPCR: POSITIVE — AB

## 2017-10-30 LAB — LIPASE, BLOOD: LIPASE: 23 U/L (ref 11–51)

## 2017-10-30 LAB — GLUCOSE, CAPILLARY
GLUCOSE-CAPILLARY: 167 mg/dL — AB (ref 70–99)
Glucose-Capillary: 175 mg/dL — ABNORMAL HIGH (ref 70–99)

## 2017-10-30 MED ORDER — ATORVASTATIN CALCIUM 40 MG PO TABS
40.0000 mg | ORAL_TABLET | Freq: Every day | ORAL | Status: DC
  Administered 2017-10-30 – 2017-11-02 (×4): 40 mg via ORAL
  Filled 2017-10-30 (×4): qty 1

## 2017-10-30 MED ORDER — SODIUM CHLORIDE 0.9 % IV SOLN
INTRAVENOUS | Status: DC
  Administered 2017-10-30: 20 mL/h via INTRAVENOUS
  Administered 2017-11-03 – 2017-11-12 (×9): via INTRAVENOUS

## 2017-10-30 MED ORDER — INSULIN ASPART 100 UNIT/ML ~~LOC~~ SOLN
0.0000 [IU] | Freq: Every day | SUBCUTANEOUS | Status: DC
  Administered 2017-11-01 – 2017-11-02 (×2): 2 [IU] via SUBCUTANEOUS

## 2017-10-30 MED ORDER — ADULT MULTIVITAMIN W/MINERALS CH
1.0000 | ORAL_TABLET | Freq: Every day | ORAL | Status: DC
  Administered 2017-10-31 – 2017-11-02 (×3): 1 via ORAL
  Filled 2017-10-30 (×3): qty 1

## 2017-10-30 MED ORDER — PROSIGHT PO TABS
1.0000 | ORAL_TABLET | Freq: Every day | ORAL | Status: DC
  Administered 2017-10-31 – 2017-11-02 (×3): 1 via ORAL
  Filled 2017-10-30 (×3): qty 1

## 2017-10-30 MED ORDER — CIPROFLOXACIN IN D5W 400 MG/200ML IV SOLN
400.0000 mg | INTRAVENOUS | Status: DC
  Administered 2017-10-30: 400 mg via INTRAVENOUS
  Filled 2017-10-30: qty 200

## 2017-10-30 MED ORDER — HYDROCODONE-ACETAMINOPHEN 5-325 MG PO TABS
1.0000 | ORAL_TABLET | ORAL | Status: DC | PRN
  Filled 2017-10-30: qty 1

## 2017-10-30 MED ORDER — VANCOMYCIN 50 MG/ML ORAL SOLUTION
125.0000 mg | Freq: Four times a day (QID) | ORAL | Status: DC
  Administered 2017-10-30 – 2017-10-31 (×3): 125 mg via ORAL
  Filled 2017-10-30 (×4): qty 2.5

## 2017-10-30 MED ORDER — SODIUM CHLORIDE 0.9 % IV SOLN
INTRAVENOUS | Status: DC
  Administered 2017-10-30 – 2017-11-01 (×4): via INTRAVENOUS

## 2017-10-30 MED ORDER — INSULIN ASPART 100 UNIT/ML ~~LOC~~ SOLN
0.0000 [IU] | Freq: Three times a day (TID) | SUBCUTANEOUS | Status: DC
  Administered 2017-10-30: 3 [IU] via SUBCUTANEOUS
  Administered 2017-10-31 (×3): 2 [IU] via SUBCUTANEOUS
  Administered 2017-11-01 (×3): 5 [IU] via SUBCUTANEOUS
  Administered 2017-11-02: 3 [IU] via SUBCUTANEOUS
  Administered 2017-11-02: 8 [IU] via SUBCUTANEOUS
  Administered 2017-11-02 – 2017-11-03 (×4): 3 [IU] via SUBCUTANEOUS
  Administered 2017-11-06 – 2017-11-07 (×2): 2 [IU] via SUBCUTANEOUS

## 2017-10-30 MED ORDER — METOPROLOL TARTRATE 25 MG PO TABS
25.0000 mg | ORAL_TABLET | Freq: Two times a day (BID) | ORAL | Status: DC
  Administered 2017-10-30 – 2017-11-02 (×7): 25 mg via ORAL
  Filled 2017-10-30 (×7): qty 1

## 2017-10-30 MED ORDER — IPRATROPIUM-ALBUTEROL 0.5-2.5 (3) MG/3ML IN SOLN: 3.0000 mL | RESPIRATORY_TRACT | Status: DC | PRN

## 2017-10-30 MED ORDER — SODIUM CHLORIDE 0.9 % IV BOLUS
500.0000 mL | Freq: Once | INTRAVENOUS | Status: AC
  Administered 2017-10-30: 500 mL via INTRAVENOUS

## 2017-10-30 MED ORDER — ACETAMINOPHEN 325 MG PO TABS
650.0000 mg | ORAL_TABLET | Freq: Four times a day (QID) | ORAL | Status: DC | PRN
  Filled 2017-10-30: qty 2

## 2017-10-30 MED ORDER — INSULIN GLARGINE 100 UNIT/ML ~~LOC~~ SOLN
20.0000 [IU] | Freq: Every day | SUBCUTANEOUS | Status: DC
  Filled 2017-10-30: qty 0.2

## 2017-10-30 MED ORDER — PRESERVISION AREDS 2+MULTI VIT PO CAPS: ORAL_CAPSULE | Freq: Two times a day (BID) | ORAL | Status: DC

## 2017-10-30 MED ORDER — METRONIDAZOLE IN NACL 5-0.79 MG/ML-% IV SOLN
500.0000 mg | Freq: Three times a day (TID) | INTRAVENOUS | Status: AC
  Administered 2017-10-30 – 2017-11-05 (×20): 500 mg via INTRAVENOUS
  Filled 2017-10-30 (×20): qty 100

## 2017-10-30 MED ORDER — LORAZEPAM 2 MG/ML IJ SOLN
1.0000 mg | Freq: Once | INTRAMUSCULAR | Status: AC
  Administered 2017-10-30: 1 mg via INTRAVENOUS
  Filled 2017-10-30: qty 1

## 2017-10-30 MED ORDER — INSULIN GLARGINE 100 UNIT/ML ~~LOC~~ SOLN
25.0000 [IU] | Freq: Every day | SUBCUTANEOUS | Status: DC
  Administered 2017-10-30 – 2017-11-03 (×5): 25 [IU] via SUBCUTANEOUS
  Filled 2017-10-30 (×5): qty 0.25

## 2017-10-30 NOTE — ED Notes (Signed)
No urine in pure wick at this time 

## 2017-10-30 NOTE — ED Notes (Signed)
Patient is resting comfortably. 

## 2017-10-30 NOTE — ED Notes (Signed)
Patient is being cleaned and having new purewick put in place.

## 2017-10-30 NOTE — H&P (Addendum)
History and Physical    Lauren Crosby XBD:[id number] DOB: 1931-06-14 DOA: 10/30/2017  I have briefly reviewed the patient's prior medical records in [place]  PCP: [name], MD  Patient coming from: home  Chief Complaint: Diarrhea  HPI: Lauren Crosby is a 84 y.o. female with medical history significant of diabetes mellitus, chronic kidney disease stage III hypertension, hyperlipidemia, morbid obesity, chronic lower extremity swelling presents to the hospital with chief complaint of diarrhea and weakness.  Patient's husband tells me that about 8 or 9 days ago she became extremely constipated.  He went to the pharmacy and bought some Dulcolax, and he was told to give 1 pill on day 1, and if that does not work then 2 pills and if that does not work even 3 pills in the day after.  He did just that, however afterwards patient has been having significant profuse watery diarrhea that is been going on for the past 5 straight days despite receiving no further laxatives.  She also been complaining of crampy abdominal pain.  They have also seen traces of blood in her diarrhea.  She denies any fever chills, denies any nausea or vomiting.  States that her appetite has been okay and she is able to eat and drink.  She has had progressive weakness, now barely able to ambulate.  She denies any fever or chills.  She denies any sick contacts.  She denies any antibiotics in the last couple of months.  ED Course: In the emergency room her vital signs are stable, she is satting well on room air and she is afebrile.  Blood work reveals a creatinine of 4, mild hyponatremia with a sodium of 131, bicarb 18, anemia with a hemoglobin of 8.2.  CT scan of the abdomen and pelvis revealed wall thickening of the rectosigmoid colon with mild pericolonic infiltrative changes question diverticulitis versus colitis  Review of Systems: As per HPI otherwise 10 point review of systems negative.   Past Medical  History:  Diagnosis Date  . Adrenal adenoma   . Anemia   . Arthritis    OA AND PAIN BOTH KNEES BUT RIGHT KNEE PAIN WORSE; SOME LOWER BACK PAIN  . Cellulitis and abscess of trunk   . Colon polyp    adenomatous  . Diabetes ([place])    type 2  PT IS ON ORAL MEDICATION AND INSULIN  . Diverticulitis   . GERD (gastroesophageal reflux disease)    NO MEDS FOR GERD  . Goiter   . Gout   . Hepatitis    YELLOW JAUNDICE WHEN A CHILD  . Hernia   . Hyperlipemia   . Hypertension   . Nephrolithiasis   . 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 Obesity   . Open wound of abdominal wall, anterior, without mention of complication   . Pneumonia    IN THE PAST  . Retinopathy due to secondary diabetes mellitus ([place])   . Shortness of breath    PT IS NOT ABLE TO BE ACTIVE BECAUSE OF KNEE PROBLEM; HAS ALWAYS HAD PROBLEM WITH BREATHING HEAVY - DOES GET SOB WITH EXERTION.  Marland Kitchen Thalassemia minor     Past Surgical History:  Procedure Laterality Date  . APPENDECTOMY     1993 PT HAD APPENDECTOMY AND ABDOMINAL SURGERY FRO DIVERTICULITS  . CARDIAC CATHETERIZATION  2008   conclusion: smooth and normal coronary arteries, normal left ventricular systolic function  . CHOLECYSTECTOMY    . COLONOSCOPY  10/04/06  .  CYSTOSCOPY WITH BIOPSY N/A 06/16/2016   Procedure: CYSTOSCOPY WITH  BLADDER BIOPSY AND FULGERATION 1CM;  Surgeon: Festus Aloe, MD;  Location: [facility];  Service: Urology;  Laterality: N/A;  . DIAGNOSTIC MAMMOGRAM  03/17/2011  . ROTATOR CUFF REPAIR Left   . SIGMOID RESECTION / RECTOPEXY    . TONSILLECTOMY    . TOTAL ABDOMINAL HYSTERECTOMY    . TOTAL KNEE ARTHROPLASTY Right 08/11/2013   Procedure: RIGHT TOTAL KNEE ARTHROPLASTY;  Surgeon: Mauri Pole, MD;  Location: [facility];  Service: Orthopedics;  Laterality: Right;  . VENTRAL HERNIA REPAIR  2007     reports that she has never smoked. She has never used smokeless tobacco. She reports that she does not drink alcohol or use  drugs.  Allergies  Allergen Reactions  . Penicillins Other (See Comments)    Has patient had a PCN reaction causing immediate rash, facial/tongue/throat swelling, SOB or lightheadedness with hypotension: No Has patient had a PCN reaction causing severe rash involving mucus membranes or skin necrosis: No Has patient had a PCN reaction that required hospitalization: No Has patient had a PCN reaction occurring within the last 10 years: Unknown If all of the above answers are "NO", then may proceed with Cephalosporin use.    Family History  Problem Relation Age of Onset  . Diabetes Father   . Hypertension Father   . Kidney disease Father   . Liver disease Mother   . Hypertension Unknown   . Rheum arthritis Sister   . Diverticulitis Sister   . Hypertension Sister   . Colon cancer Neg Hx   . Colon polyps Neg Hx   . Esophageal cancer Neg Hx   . Pancreatic cancer Neg Hx   . Stomach cancer Neg Hx     Prior to Admission medications   Medication Sig Start Date End Date Taking? Authorizing Provider  allopurinol (ZYLOPRIM) 300 MG tablet Take 300 mg by mouth daily.   Yes [provider]  furosemide (LASIX) 20 MG tablet Take 20 mg by mouth daily.  11/01/16  Yes [provider]  insulin NPH-regular Human (NOVOLIN 70/30) (70-30) 100 UNIT/ML injection 45 units in the am and 50 units at supper   Yes [provider]  metoprolol tartrate (LOPRESSOR) 25 MG tablet Take 1 tablet (25 mg total) by mouth 2 (two) times daily. 06/01/17  Yes Velvet Bathe, MD  Multiple Vitamin (MULTIVITAMIN WITH MINERALS) TABS tablet Take 1 tablet by mouth daily.   Yes [provider]  Multiple Vitamins-Minerals (PRESERVISION AREDS 2+MULTI VIT PO) Take 1 capsule by mouth 2 (two) times daily.    Yes [provider]  simvastatin (ZOCOR) 80 MG tablet Take 80 mg by mouth at bedtime.    Yes [provider]  esomeprazole (NEXIUM) 40 MG capsule Take 40 mg by mouth daily. 01/05/17    [provider]  guaiFENesin-dextromethorphan (ROBITUSSIN DM) 100-10 MG/5ML syrup Take 5 mLs by mouth every 4 (four) hours as needed for cough. Patient not taking: Reported on 10/30/2017 01/14/17   Rosita Fire, MD    Physical Exam: Vitals:   10/30/17 1305 10/30/17 1310 10/30/17 1330 10/30/17 1400  BP:   (!) 140/58 (!) 143/52  Pulse:   89 75  Resp:   (!) 24 (!) 24  Temp:      TempSrc:      SpO2: 97% 95% 92% 91%  Weight:      Height:        Constitutional: NAD, morbidly obese Caucasian  female Eyes: PERRL, lids and conjunctivae normal ENMT: Mucous membranes are dry.  Poor dentition Neck: normal, no masses Respiratory: clear to auscultation bilaterally, faint and expiratory wheezing, no crackles.  Increased respiratory effort and tachypnea (husband tells me this is her baseline) Cardiovascular: Regular rate and rhythm, no murmurs / rubs / gallops.  1-2+ pitting lower extremity edema. 2+ pedal pulses.  Abdomen: no tenderness, no masses palpated. Bowel sounds positive.  Musculoskeletal: no clubbing / cyanosis.  Skin: Chronic venous stasis changes bilateral lower extremities Neurologic: CN 2-12 grossly intact. Strength 5/5 in all 4.  Psychiatric: Normal judgment and insight. Alert and oriented x 3. Normal mood.   Labs on Admission: I have personally reviewed following labs and imaging studies  CBC: Recent Labs  Lab 10/30/17 0815  WBC 10.5  NEUTROABS 8.3*  HGB 8.2*  HCT 27.2*  MCV 67.3*  PLT 630   Basic Metabolic Panel: Recent Labs  Lab 10/30/17 0815  NA 131*  K 4.5  CL 101  CO2 18*  GLUCOSE 148*  BUN 81*  CREATININE 3.96*  CALCIUM 8.2*   GFR: Estimated Creatinine Clearance: 12.7 mL/min (A) (by C-G formula based on SCr of 3.96 mg/dL (H)). Liver Function Tests: Recent Labs  Lab 10/30/17 0815  AST 28  ALT 24  ALKPHOS 151*  BILITOT 0.5  PROT 7.4  ALBUMIN 2.9*   Recent Labs  Lab 10/30/17 0815  LIPASE 23   No results for input(s):  AMMONIA in the last 168 hours. Coagulation Profile: No results for input(s): INR, PROTIME in the last 168 hours. Cardiac Enzymes: No results for input(s): CKTOTAL, CKMB, CKMBINDEX, TROPONINI in the last 168 hours. BNP (last 3 results) No results for input(s): PROBNP in the last 8760 hours. HbA1C: No results for input(s): HGBA1C in the last 72 hours. CBG: No results for input(s): GLUCAP in the last 168 hours. Lipid Profile: No results for input(s): CHOL, HDL, LDLCALC, TRIG, CHOLHDL, LDLDIRECT in the last 72 hours. Thyroid Function Tests: No results for input(s): TSH, T4TOTAL, FREET4, T3FREE, THYROIDAB in the last 72 hours. Anemia Panel: No results for input(s): VITAMINB12, FOLATE, FERRITIN, TIBC, IRON, RETICCTPCT in the last 72 hours. Urine analysis:    Component Value Date/Time   COLORURINE YELLOW 05/22/2017 2052   APPEARANCEUR TURBID (A) 05/22/2017 2052   LABSPEC 1.011 05/22/2017 2052   PHURINE 5.0 05/22/2017 2052   GLUCOSEU NEGATIVE 05/22/2017 2052   HGBUR SMALL (A) 05/22/2017 2052   BILIRUBINUR NEGATIVE 05/22/2017 2052   BILIRUBINUR neg 09/28/2014 [place] 05/22/2017 2052   PROTEINUR 100 (A) 05/22/2017 2052   UROBILINOGEN negative 09/28/2014 1020   UROBILINOGEN 0.2 07/31/2013 1042   NITRITE NEGATIVE 05/22/2017 2052   LEUKOCYTESUR LARGE (A) 05/22/2017 2052     Radiological Exams on Admission: Ct Abdomen Pelvis Wo Contrast  Result Date: 10/30/2017 CLINICAL DATA:  Abdominal distension, diarrhea for 5 days, blood in diarrhea, unable to control bladder or bowel, history of adrenal adenoma, diabetes mellitus, hypertension, nephrolithiasis, renal failure EXAM: CT ABDOMEN AND PELVIS WITHOUT CONTRAST TECHNIQUE: Multidetector CT imaging of the abdomen and pelvis was performed following the standard protocol without IV contrast. Sagittal and coronal MPR images reconstructed from axial data set. Patient drank dilute oral contrast for exam COMPARISON:  05/24/2017  FINDINGS: Lower chest: Bibasilar atelectasis and peribronchial thickening Hepatobiliary: Gallbladder surgically absent. No focal hepatic abnormalities Pancreas: Pancreatic atrophy without mass Spleen: Normal appearance Adrenals/Urinary Tract: Low-attenuation LEFT adrenal mass 3.1 x 2.6 cm consistent with adenoma. RIGHT adrenal gland unremarkable. BILATERAL  renal cortical atrophy. Exophytic intermediate attenuation mass lateral aspect of inferior RIGHT kidney, 3.7 x 3.6 cm image 56. Minimal prominence of LEFT renal pelvis and LEFT ureter in the pelvis without obstructing calculus. RIGHT ureter and bladder normal appearance. Stomach/Bowel: Appendix surgically absent by history. Scattered colonic diverticulosis. Wall thickening of sigmoid colon with mild pericolic infiltrative changes. Additional wall thickening at the rectosigmoid colon. Findings could represent acute diverticulitis or colitis. No abscess or extraluminal gas. Stomach and small bowel loops unremarkable. Vascular/Lymphatic: Atherosclerotic calcifications aorta and iliac arteries. Coronary arterial calcifications. Aorta normal caliber. No adenopathy. Reproductive: Uterus surgically absent.  Normal appearing ovaries. Other: No free air or free fluid. Tiny LEFT parasagittal ventral hernia inferior to the umbilicus. Musculoskeletal: Demineralized with degenerative disc and facet disease changes lumbar spine. IMPRESSION: Diffuse colonic diverticulosis. Wall thickening of the rectosigmoid colon with mild pericolic infiltrative changes, question diverticulitis versus colitis. No evidence of perforation or abscess. LEFT adrenal adenoma 3.1 x 2.6 cm. Grossly unchanged appearance of an exophytic mass 3.7 x 3.6 cm at the lateral RIGHT kidney, question complicated cyst; lesion is unchanged since 05/24/2017 but minimally larger than seen on 02/10/2015 when it measured 3.5 x 3.2 cm, recommended attention on follow-up imaging to establish long-term stability.  Electronically Signed   By: Lavonia Dana M.D.   On: 10/30/2017 13:03    Assessment/Plan Active Problems:   HTN (hypertension)   Hypertension   Hyperlipemia   Acute kidney failure (HCC)   Abdominal pain   Diarrhea   CKD (chronic kidney disease) stage 3, GFR 30-59 ml/min (HCC)    Acute kidney injury on chronic kidney disease stage III -Creatinine on admission in the 4 range from prior values of about 1.3, likely in the setting of profuse diarrhea, hold home Lasix and provide IV fluids -Repeat renal function in the morning  Acute diarrheal illness -CT scan of the abdomen and pelvis with diffuse colonic diverticulosis, she does have some wall thickening of the rectosigmoid colon with mild pericolic infiltrative changes -GI pathogen panel was sent, sent for C. difficile, given CT findings with favor to put on empiric antibiotics and if all infectious work-up is negative to stop.  It seems quite long to be an effect of the laxatives -Possible streaks of blood suspect in the setting of rectosigmoid findings, also with a history of hemorrhoids.  Monitor CBC, no frank bleeding reported  Anemia of chronic disease -Monitor CBC, hemoglobin 8.2 from prior values in the 9 range earlier this year  Diabetes mellitus -Hold her 70/30, placed on Lantus at a lower dose and sliding scale  Hyperlipidemia -Continue simvastatin  Chronic diastolic CHF -Hold Lasix and give fluids as above, does have chronic lower extremity swelling  Tachypnea/heavy breathing -She is satting well on room air, has some scant expiratory wheezing, husband and patient state that this is her baseline, currently she has no dyspnea, will obtain a chest x-ray for completeness and provide PRN duo nebs   DVT prophylaxis: SCDs  Code Status: Full code  Family Communication: husband at bedside Disposition Plan: home when readt Consults called: none     Admission status: obs   At the point of initial evaluation, it is my  clinical opinion that admission for OBSERVATION is reasonable and necessary because the patient's presenting complaints in the context of their chronic conditions represent sufficient risk of deterioration or significant morbidity to constitute reasonable grounds for close observation in the hospital setting, but that the patient may be medically stable for discharge from  the hospital within 24 to 48 hours.   Marzetta Board, MD [facility] Pager [phone]  If 7PM-7AM, please contact night-coverage [email or web address] Password [email or web address]  10/30/2017, 2:21 PM

## 2017-10-30 NOTE — ED Notes (Signed)
ED TO INPATIENT HANDOFF REPORT  Name/Age/Gender Lauren Crosby 84 y.o. female  Code Status    Code Status Orders  (From admission, onward)         Start     Ordered   10/30/17 1416  Full code  Continuous     10/30/17 1415        Code Status History    Date Active Date Inactive Code Status Order ID Comments User Context   05/22/2017 2250 06/01/2017 1900 Full Code [id number]  Etta Quill, DO ED   01/13/2017 0250 01/15/2017 1622 Full Code [id number]  Jani Gravel, MD ED   08/11/2013 1955 08/13/2013 1559 Full Code [id number]  Babish, Lucille Passy, PA-C Inpatient      Home/SNF/Other Home  Chief Complaint diarrhea/weak  Level of Care/Admitting Diagnosis ED Disposition    ED Disposition Condition [place] [facility] Area: [facility] [[id number]]  Level of Care: Med-Surg [16]  Diagnosis: Diarrhea [787.91.ICD-9-CM]  Admitting Physician: Caren Griffins [[id number]]  Attending Physician: Caren Griffins [phone]  PT Class (Do Not Modify): Observation [104]  PT Acc Code (Do Not Modify): Observation [10022]       Medical History Past Medical History:  Diagnosis Date  . Adrenal adenoma   . Anemia   . Arthritis    OA AND PAIN BOTH KNEES BUT RIGHT KNEE PAIN WORSE; SOME LOWER BACK PAIN  . Cellulitis and abscess of trunk   . Colon polyp    adenomatous  . Diabetes ([place])    type 2  PT IS ON ORAL MEDICATION AND INSULIN  . Diverticulitis   . GERD (gastroesophageal reflux disease)    NO MEDS FOR GERD  . Goiter   . Gout   . Hepatitis    YELLOW JAUNDICE WHEN A CHILD  . Hernia   . Hyperlipemia   . Hypertension   . Nephrolithiasis   . 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 Obesity   . Open wound of abdominal wall, anterior, without mention of complication   . Pneumonia    IN THE PAST  . Retinopathy due to secondary diabetes mellitus ([place])   . Shortness of breath    PT IS NOT ABLE TO BE ACTIVE BECAUSE OF KNEE PROBLEM; HAS ALWAYS HAD  PROBLEM WITH BREATHING HEAVY - DOES GET SOB WITH EXERTION.  Marland Kitchen Thalassemia minor     Allergies Allergies  Allergen Reactions  . Penicillins Other (See Comments)    Has patient had a PCN reaction causing immediate rash, facial/tongue/throat swelling, SOB or lightheadedness with hypotension: No Has patient had a PCN reaction causing severe rash involving mucus membranes or skin necrosis: No Has patient had a PCN reaction that required hospitalization: No Has patient had a PCN reaction occurring within the last 10 years: Unknown If all of the above answers are "NO", then may proceed with Cephalosporin use.    IV Location/Drains/Wounds Patient Lines/Drains/Airways Status   Active Line/Drains/Airways    Name:   Placement date:   Placement time:   Site:   Days:   Peripheral IV 10/30/17 Right;Lateral Forearm   10/30/17    0809    Forearm   less than 1   External Urinary Catheter   10/30/17    0751    -   less than 1          Labs/Imaging Results for orders placed or performed during the hospital encounter of 10/30/17 (from the past 48 hour(s))  CBC with Differential/Platelet     Status: Abnormal   Collection Time: 10/30/17  8:15 AM  Result Value Ref Range   WBC 10.5 4.0 - 10.5 K/uL   RBC 4.04 3.87 - 5.11 MIL/uL   Hemoglobin 8.2 (L) 12.0 - 15.0 g/dL    Comment: Reticulocyte Hemoglobin testing may be clinically indicated, consider ordering this additional test [id number]    HCT 27.2 (L) 36.0 - 46.0 %   MCV 67.3 (L) 80.0 - 100.0 fL   MCH 20.3 (L) 26.0 - 34.0 pg   MCHC 30.1 30.0 - 36.0 g/dL   RDW 16.3 (H) 11.5 - 15.5 %   Platelets 264 150 - 400 K/uL   nRBC 0.2 0.0 - 0.2 %   Neutrophils Relative % 78 %   Neutro Abs 8.3 (H) 1.7 - 7.7 K/uL   Lymphocytes Relative 11 %   Lymphs Abs 1.2 0.7 - 4.0 K/uL   Monocytes Relative 10 %   Monocytes Absolute 1.0 0.1 - 1.0 K/uL   Eosinophils Relative 0 %   Eosinophils Absolute 0.0 0.0 - 0.5 K/uL   Basophils Relative 0 %   Basophils Absolute  0.0 0.0 - 0.1 K/uL   Immature Granulocytes 1 %   Abs Immature Granulocytes 0.06 0.00 - 0.07 K/uL   Polychromasia PRESENT    Target Cells PRESENT     Comment: Performed at [facility], [place] [address] [place], [place] [address]  Comprehensive metabolic panel     Status: Abnormal   Collection Time: 10/30/17  8:15 AM  Result Value Ref Range   Sodium 131 (L) 135 - 145 mmol/L   Potassium 4.5 3.5 - 5.1 mmol/L   Chloride 101 98 - 111 mmol/L   CO2 18 (L) 22 - 32 mmol/L   Glucose, Bld 148 (H) 70 - 99 mg/dL   BUN 81 (H) 8 - 23 mg/dL   Creatinine, Ser 3.96 (H) 0.44 - 1.00 mg/dL   Calcium 8.2 (L) 8.9 - 10.3 mg/dL   Total Protein 7.4 6.5 - 8.1 g/dL   Albumin 2.9 (L) 3.5 - 5.0 g/dL   AST 28 15 - 41 U/L   ALT 24 0 - 44 U/L   Alkaline Phosphatase 151 (H) 38 - 126 U/L   Total Bilirubin 0.5 0.3 - 1.2 mg/dL   GFR calc non Af Amer 10 (L) >60 mL/min   GFR calc Af Amer 11 (L) >60 mL/min    Comment: (NOTE) The eGFR has been calculated using the CKD EPI equation. This calculation has not been validated in all clinical situations. eGFR's persistently <60 mL/min signify possible Chronic Kidney Disease.    Anion gap 12 5 - 15    Comment: Performed at [facility], [place] [address], [place], [place] [address]  Lipase, blood     Status: None   Collection Time: 10/30/17  8:15 AM  Result Value Ref Range   Lipase 23 11 - 51 U/L    Comment: Performed at [facility], [place] [address], [place], [place] [address]  C difficile quick scan w PCR reflex     Status: Abnormal   Collection Time: 10/30/17  9:59 AM  Result Value Ref Range   C Diff antigen POSITIVE (A) NEGATIVE   C Diff toxin NEGATIVE NEGATIVE   C Diff interpretation Results are indeterminate. See PCR results.     Comment: Performed at [facility], [place] [address] [place],  [address]   Ct Abdomen Pelvis Wo  Contrast  Result Date: 10/30/2017 CLINICAL DATA:  Abdominal  distension, diarrhea for 5 days, blood in diarrhea, unable to control bladder or bowel, history of adrenal adenoma, diabetes mellitus, hypertension, nephrolithiasis, renal failure EXAM: CT ABDOMEN AND PELVIS WITHOUT CONTRAST TECHNIQUE: Multidetector CT imaging of the abdomen and pelvis was performed following the standard protocol without IV contrast. Sagittal and coronal MPR images reconstructed from axial data set. Patient drank dilute oral contrast for exam COMPARISON:  05/24/2017 FINDINGS: Lower chest: Bibasilar atelectasis and peribronchial thickening Hepatobiliary: Gallbladder surgically absent. No focal hepatic abnormalities Pancreas: Pancreatic atrophy without mass Spleen: Normal appearance Adrenals/Urinary Tract: Low-attenuation LEFT adrenal mass 3.1 x 2.6 cm consistent with adenoma. RIGHT adrenal gland unremarkable. BILATERAL renal cortical atrophy. Exophytic intermediate attenuation mass lateral aspect of inferior RIGHT kidney, 3.7 x 3.6 cm image 56. Minimal prominence of LEFT renal pelvis and LEFT ureter in the pelvis without obstructing calculus. RIGHT ureter and bladder normal appearance. Stomach/Bowel: Appendix surgically absent by history. Scattered colonic diverticulosis. Wall thickening of sigmoid colon with mild pericolic infiltrative changes. Additional wall thickening at the rectosigmoid colon. Findings could represent acute diverticulitis or colitis. No abscess or extraluminal gas. Stomach and small bowel loops unremarkable. Vascular/Lymphatic: Atherosclerotic calcifications aorta and iliac arteries. Coronary arterial calcifications. Aorta normal caliber. No adenopathy. Reproductive: Uterus surgically absent.  Normal appearing ovaries. Other: No free air or free fluid. Tiny LEFT parasagittal ventral hernia inferior to the umbilicus. Musculoskeletal: Demineralized with degenerative disc and facet disease changes lumbar spine. IMPRESSION: Diffuse colonic diverticulosis. Wall thickening of the  rectosigmoid colon with mild pericolic infiltrative changes, question diverticulitis versus colitis. No evidence of perforation or abscess. LEFT adrenal adenoma 3.1 x 2.6 cm. Grossly unchanged appearance of an exophytic mass 3.7 x 3.6 cm at the lateral RIGHT kidney, question complicated cyst; lesion is unchanged since 05/24/2017 but minimally larger than seen on 02/10/2015 when it measured 3.5 x 3.2 cm, recommended attention on follow-up imaging to establish long-term stability. Electronically Signed   By: Lavonia Dana M.D.   On: 10/30/2017 13:03   Dg Chest Port 1 View  Result Date: 10/30/2017 CLINICAL DATA:  84 year old female with constipation and watery diarrhea. Initial encounter. EXAM: PORTABLE CHEST 1 VIEW COMPARISON:  05/27/2017. FINDINGS: Cardiomegaly. Asymmetric mild pulmonary edema superimposed upon chronic changes. Calcified aorta. IMPRESSION: 1. Asymmetric mild pulmonary edema. 2. Cardiomegaly. 3.  Aortic Atherosclerosis (ICD10-I70.0). Electronically Signed   By: Genia Del M.D.   On: 10/30/2017 15:06   None  Pending Labs Unresulted Labs (From admission, onward)    Start     Ordered   10/30/17 0959  C. Diff by PCR, Reflexed  Once,   STAT     10/30/17 0959   10/30/17 0733  CBC  STAT,   STAT     10/30/17 0732   10/30/17 0731  Urinalysis, Routine w reflex microscopic  Once,   R     10/30/17 0730   10/30/17 0731  Gastrointestinal Panel by PCR , Stool  (Gastrointestinal Panel by PCR, Stool)  Once,   R     10/30/17 0730   Signed and Held  Comprehensive metabolic panel  Tomorrow morning,   R     Signed and Held   Signed and Held  CBC  Tomorrow morning,   R     Signed and Held          Vitals/Pain Today's Vitals   10/30/17 1400 10/30/17 1430 10/30/17 1500 10/30/17 1530  BP: (!) 143/52 (!) 144/55 (!) 134/41 (!) 134/42  Pulse: 75 87 84 86  Resp: (!) 24 (!) 27 (!) 28 (!) 27  Temp:      TempSrc:      SpO2: 91% 95% 91% 91%  Weight:      Height:      PainSc:         Isolation Precautions No active isolations  Medications Medications  0.9 %  sodium chloride infusion ( Intravenous Stopped 10/30/17 1449)  0.9 %  sodium chloride infusion (20 mL/hr Intravenous New Bag/Given 10/30/17 0811)  insulin aspart (novoLOG) injection 0-15 Units (has no administration in time range)  insulin aspart (novoLOG) injection 0-5 Units (has no administration in time range)  ciprofloxacin (CIPRO) IVPB 400 mg (400 mg Intravenous New Bag/Given 10/30/17 1446)  metroNIDAZOLE (FLAGYL) IVPB 500 mg (has no administration in time range)  ipratropium-albuterol (DUONEB) 0.5-2.5 (3) MG/3ML nebulizer solution 3 mL (has no administration in time range)  sodium chloride 0.9 % bolus 500 mL (0 mLs Intravenous Stopped 10/30/17 1026)  sodium chloride 0.9 % bolus 500 mL (0 mLs Intravenous Stopped 10/30/17 1449)    Mobility non-ambulatory

## 2017-10-30 NOTE — ED Notes (Signed)
Patient has been cleaned and repositioned.

## 2017-10-30 NOTE — ED Notes (Signed)
Pure wick removed at this time due to no stop BM.

## 2017-10-30 NOTE — ED Notes (Signed)
Patient transported to CT 

## 2017-10-30 NOTE — ED Notes (Signed)
Purewick placed on pt. 

## 2017-10-30 NOTE — ED Notes (Signed)
Patient can not control bowel or bladder movements. Patient has recently within the past 15 minutes, urinated on self.

## 2017-10-30 NOTE — ED Provider Notes (Signed)
[place] DEPT Provider Note   CSN: [id number] Arrival date & time: 10/30/17  4081     History   Chief Complaint Chief Complaint  Patient presents with  . Diarrhea    HPI Lauren Crosby is a 84 y.o. female.  84 year old female with history of constipation presents with several days of watery diarrhea.  Patient has been having constipation but then took Dulcolax and shortly thereafter developed diarrhea.  She has noted occasionally blood-streaked stools which she thinks is from her hemorrhoids.  Denies any fever or chills.  No emesis.  Has noted abdominal distention.  Has had dark urine as well but without urinary symptoms.  Nothing makes her symptoms better.  Has had increasing weakness.     Past Medical History:  Diagnosis Date  . Adrenal adenoma   . Anemia   . Arthritis    OA AND PAIN BOTH KNEES BUT RIGHT KNEE PAIN WORSE; SOME LOWER BACK PAIN  . Cellulitis and abscess of trunk   . Colon polyp    adenomatous  . Diabetes ([place])    type 2  PT IS ON ORAL MEDICATION AND INSULIN  . Diverticulitis   . GERD (gastroesophageal reflux disease)    NO MEDS FOR GERD  . Goiter   . Gout   . Hepatitis    YELLOW JAUNDICE WHEN A CHILD  . Hernia   . Hyperlipemia   . Hypertension   . Nephrolithiasis   . 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 Obesity   . Open wound of abdominal wall, anterior, without mention of complication   . Pneumonia    IN THE PAST  . Retinopathy due to secondary diabetes mellitus ([place])   . Shortness of breath    PT IS NOT ABLE TO BE ACTIVE BECAUSE OF KNEE PROBLEM; HAS ALWAYS HAD PROBLEM WITH BREATHING HEAVY - DOES GET SOB WITH EXERTION.  Marland Kitchen Thalassemia minor     Patient Active Problem List   Diagnosis Date Noted  . Pyelitis: Mild per CT 05/24/2017 05/25/2017  . Acidemia   . Abdominal pain 05/24/2017  . Vaginal candidiasis 05/24/2017  . Dermatitis associated with moisture 05/24/2017  . Ileus [facility]):  Colonic 05/24/2017  . Acute lower UTI 05/23/2017  . Iron deficiency anemia 05/23/2017  . Pressure injury of buttock, stage 2 ([place]) 05/23/2017  . Acute on chronic anemia   . Hyperkalemia   . Hydronephrosis   . Acute kidney failure ([place]) 05/22/2017  . Metabolic acidosis, normal anion gap (NAG) 05/22/2017  . Hyperkalemia, diminished renal excretion 05/22/2017  . Rhinovirus infection   . AKI (acute kidney injury) ([place])   . HCAP (healthcare-associated pneumonia) 01/12/2017  . ARF (acute renal failure) ([place]) 01/12/2017  . Shortness of breath   . Retinopathy due to secondary diabetes mellitus ([place])   . Pneumonia   . Obesity   . 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 Nephrolithiasis   . Hypertension   . Hyperlipemia   . Hepatitis   . Gout   . Goiter   . GERD (gastroesophageal reflux disease)   . Diverticulitis   . Diabetes ([place])   . Colon polyp   . Cellulitis and abscess of trunk   . Arthritis   . Chronic anemia   . Adrenal adenoma   . Morbid obesity ([place]) 08/13/2013  . Postoperative anemia due to acute blood loss 08/13/2013  . S/P right TKA 08/11/2013  . Preop cardiovascular exam 08/14/2012  .  Chest pain 08/14/2012  . Suture granuloma 02/09/2012  . DYSPNEA ON EXERTION 10/04/2009  . HYPERLIPIDEMIA 04/15/2007  . OBESITY 04/15/2007  . ANEMIA, CHRONIC 04/15/2007  . ANXIETY 04/15/2007  . DEPRESSION 04/15/2007  . HTN (hypertension) 04/15/2007  . GERD 04/15/2007  . DEGENERATIVE JOINT DISEASE 04/15/2007  . HELICOBACTER PYLORI INFECTION, HX OF 04/15/2007  . COLONIC POLYPS 10/04/2006  . DIVERTICULOSIS, COLON 10/04/2006  . REFLUX ESOPHAGITIS 02-07-2001  . GASTRITIS, ACUTE 02-07-2001    Past Surgical History:  Procedure Laterality Date  . APPENDECTOMY     1993 PT HAD APPENDECTOMY AND ABDOMINAL SURGERY FRO DIVERTICULITS  . CARDIAC CATHETERIZATION  2008   conclusion: smooth and normal coronary arteries, normal left ventricular systolic function  .  CHOLECYSTECTOMY    . COLONOSCOPY  10/04/06  . CYSTOSCOPY WITH BIOPSY N/A 06/16/2016   Procedure: CYSTOSCOPY WITH  BLADDER BIOPSY AND FULGERATION 1CM;  Surgeon: Festus Aloe, MD;  Location: [facility] ORS;  Service: Urology;  Laterality: N/A;  . DIAGNOSTIC MAMMOGRAM  03/17/2011  . ROTATOR CUFF REPAIR Left   . SIGMOID RESECTION / RECTOPEXY    . TONSILLECTOMY    . TOTAL ABDOMINAL HYSTERECTOMY    . TOTAL KNEE ARTHROPLASTY Right 08/11/2013   Procedure: RIGHT TOTAL KNEE ARTHROPLASTY;  Surgeon: Mauri Pole, MD;  Location: [facility] ORS;  Service: Orthopedics;  Laterality: Right;  . VENTRAL HERNIA REPAIR  2007     OB History    Gravida  7   Para  6   Term  6   Preterm      AB  1   Living  6     SAB  1   TAB      Ectopic      Multiple      Live Births  6            Home Medications    Prior to Admission medications   Medication Sig Start Date End Date Taking? Authorizing Provider  allopurinol (ZYLOPRIM) 300 MG tablet Take 300 mg by mouth daily.    [provider]  docusate sodium (COLACE) 100 MG capsule Take 100 mg by mouth every 12 (twelve) hours.    [provider]  esomeprazole (NEXIUM) 40 MG capsule Take 40 mg by mouth daily. 01/05/17   [provider]  furosemide (LASIX) 20 MG tablet Take 20-40 mg by mouth daily. 40mg  in the morning, 20 mg in the evening 11/01/16   [provider]  guaiFENesin-dextromethorphan (ROBITUSSIN DM) 100-10 MG/5ML syrup Take 5 mLs by mouth every 4 (four) hours as needed for cough. 01/14/17   Rosita Fire, MD  insulin NPH-regular Human (NOVOLIN 70/30) (70-30) 100 UNIT/ML injection 45 units in the am and 50 units at supper    [provider]  iron polysaccharides (NIFEREX) 150 MG capsule Take 150 mg by mouth 2 (two) times daily.     [provider]  metoprolol tartrate (LOPRESSOR) 25 MG tablet Take 1 tablet (25 mg total) by mouth 2 (two) times daily. 06/01/17   Velvet Bathe, MD  Multiple Vitamin  (MULTIVITAMIN WITH MINERALS) TABS tablet Take 1 tablet by mouth daily.    [provider]  Multiple Vitamins-Minerals (PRESERVISION AREDS 2+MULTI VIT PO) Take 1 capsule by mouth 2 (two) times daily.     [provider]  simvastatin (ZOCOR) 80 MG tablet Take 80 mg by mouth at bedtime.     [provider]    Family History Family History  Problem Relation Age of  Onset  . Diabetes Father   . Hypertension Father   . Kidney disease Father   . Liver disease Mother   . Hypertension Unknown   . Rheum arthritis Sister   . Diverticulitis Sister   . Hypertension Sister   . Colon cancer Neg Hx   . Colon polyps Neg Hx   . Esophageal cancer Neg Hx   . Pancreatic cancer Neg Hx   . Stomach cancer Neg Hx     Social History Social History   Tobacco Use  . Smoking status: Never Smoker  . Smokeless tobacco: Never Used  Substance Use Topics  . Alcohol use: No    Alcohol/week: 0.0 standard drinks  . Drug use: No     Allergies   Penicillins   Review of Systems Review of Systems  All other systems reviewed and are negative.    Physical Exam Updated Vital Signs BP 129/76 (BP Location: Left Arm)   Pulse 84   Temp 97.6 F (36.4 C) (Oral)   Resp 19   Ht 1.575 m (5\' 2" )   Wt 115.7 kg   SpO2 95%   BMI 46.64 kg/m   Physical Exam  Constitutional: She is oriented to person, place, and time. She appears well-developed and well-nourished.  Non-toxic appearance. No distress.  HENT:  Head: Normocephalic and atraumatic.  Eyes: Pupils are equal, round, and reactive to light. Conjunctivae, EOM and lids are normal.  Neck: Normal range of motion. Neck supple. No tracheal deviation present. No thyroid mass present.  Cardiovascular: Normal rate, regular rhythm and normal heart sounds. Exam reveals no gallop.  No murmur heard. Pulmonary/Chest: Effort normal and breath sounds normal. No stridor. No respiratory distress. She has no decreased breath sounds. She has no  wheezes. She has no rhonchi. She has no rales.  Abdominal: Soft. Normal appearance and bowel sounds are normal. She exhibits distension. There is generalized tenderness. There is no rigidity, no rebound, no guarding and no CVA tenderness.  Musculoskeletal: Normal range of motion. She exhibits no edema or tenderness.  Lymphadenopathy:  2+ bilateral lower extremity pitting edema  Neurological: She is alert and oriented to person, place, and time. No cranial nerve deficit or sensory deficit. GCS eye subscore is 4. GCS verbal subscore is 5. GCS motor subscore is 6.  Skin: Skin is warm and dry. No abrasion and no rash noted.  Psychiatric: Her affect is blunt.  Nursing note and vitals reviewed.    ED Treatments / Results  Labs (all labs ordered are listed, but only abnormal results are displayed) Labs Reviewed  GASTROINTESTINAL PANEL BY PCR, STOOL (REPLACES STOOL CULTURE)  CBC WITH DIFFERENTIAL/PLATELET  COMPREHENSIVE METABOLIC PANEL  URINALYSIS, ROUTINE W REFLEX MICROSCOPIC  LIPASE, BLOOD  CBC    EKG None  Radiology No results found.  Procedures Procedures (including critical care time)  Medications Ordered in ED Medications  0.9 %  sodium chloride infusion (has no administration in time range)  0.9 %  sodium chloride infusion (has no administration in time range)     Initial Impression / Assessment and Plan / ED Course  I have reviewed the triage vital signs and the nursing notes.  Pertinent labs & imaging results that were available during my care of the patient were reviewed by me and considered in my medical decision making (see chart for details).     Patient given IV hydration here and she  has evidence of acute kidney injury.  Abdominal CT with possible diverticulitis versus colitis.  Will start on IV antibiotics.  Will admit to the hospitalist service  Final Clinical Impressions(s) / ED Diagnoses   Final diagnoses:  None    ED Discharge Orders    None        Lacretia Leigh, MD 10/30/17 1342

## 2017-10-30 NOTE — ED Notes (Signed)
Pt cleaned and lenin changed. Blood clots noticed in stool. RN aware. Pt stated as soon as I finished cleaning her up she had another bm. Informed pt I would return in order to clean her again.

## 2017-10-30 NOTE — ED Notes (Signed)
Report given to RN [place], [phone].

## 2017-10-30 NOTE — ED Notes (Signed)
Bedside cleaning done. Pure wick replaced. Upon arrival in room to clean pt it was noted that tubing for pure wick was not connected to canister therefore urine specimen wasn't collected.

## 2017-10-30 NOTE — ED Notes (Signed)
Pt cleaned and repositioned in the bed.

## 2017-10-30 NOTE — ED Triage Notes (Signed)
Pt has had diarrhea x 5 days. Husband states that he has noticed blood in the diarrhea, but is unclear whether it is from hemorrhoid or internal. Pt denies N/V.

## 2017-10-30 NOTE — ED Notes (Signed)
New pure wick in place. No urine noted at this time

## 2017-10-30 NOTE — ED Notes (Signed)
No urine noted in pure wick at this time  

## 2017-10-31 ENCOUNTER — Inpatient Hospital Stay (HOSPITAL_COMMUNITY): Payer: Medicare HMO

## 2017-10-31 DIAGNOSIS — R197 Diarrhea, unspecified: Secondary | ICD-10-CM | POA: Diagnosis not present

## 2017-10-31 DIAGNOSIS — N171 Acute kidney failure with acute cortical necrosis: Secondary | ICD-10-CM

## 2017-10-31 DIAGNOSIS — N170 Acute kidney failure with tubular necrosis: Secondary | ICD-10-CM | POA: Diagnosis present

## 2017-10-31 DIAGNOSIS — K5900 Constipation, unspecified: Secondary | ICD-10-CM | POA: Diagnosis present

## 2017-10-31 DIAGNOSIS — R6521 Severe sepsis with septic shock: Secondary | ICD-10-CM | POA: Diagnosis present

## 2017-10-31 DIAGNOSIS — R0603 Acute respiratory distress: Secondary | ICD-10-CM | POA: Diagnosis not present

## 2017-10-31 DIAGNOSIS — I132 Hypertensive heart and chronic kidney disease with heart failure and with stage 5 chronic kidney disease, or end stage renal disease: Secondary | ICD-10-CM | POA: Diagnosis present

## 2017-10-31 DIAGNOSIS — G92 Toxic encephalopathy: Secondary | ICD-10-CM | POA: Diagnosis present

## 2017-10-31 DIAGNOSIS — R579 Shock, unspecified: Secondary | ICD-10-CM | POA: Diagnosis not present

## 2017-10-31 DIAGNOSIS — E871 Hypo-osmolality and hyponatremia: Secondary | ICD-10-CM | POA: Diagnosis present

## 2017-10-31 DIAGNOSIS — K529 Noninfective gastroenteritis and colitis, unspecified: Secondary | ICD-10-CM | POA: Diagnosis not present

## 2017-10-31 DIAGNOSIS — Z66 Do not resuscitate: Secondary | ICD-10-CM | POA: Diagnosis present

## 2017-10-31 DIAGNOSIS — Z515 Encounter for palliative care: Secondary | ICD-10-CM | POA: Diagnosis present

## 2017-10-31 DIAGNOSIS — R0682 Tachypnea, not elsewhere classified: Secondary | ICD-10-CM | POA: Diagnosis present

## 2017-10-31 DIAGNOSIS — Z7189 Other specified counseling: Secondary | ICD-10-CM | POA: Diagnosis not present

## 2017-10-31 DIAGNOSIS — I5032 Chronic diastolic (congestive) heart failure: Secondary | ICD-10-CM | POA: Diagnosis present

## 2017-10-31 DIAGNOSIS — N186 End stage renal disease: Secondary | ICD-10-CM | POA: Diagnosis present

## 2017-10-31 DIAGNOSIS — R1310 Dysphagia, unspecified: Secondary | ICD-10-CM | POA: Diagnosis present

## 2017-10-31 DIAGNOSIS — M170 Bilateral primary osteoarthritis of knee: Secondary | ICD-10-CM | POA: Diagnosis present

## 2017-10-31 DIAGNOSIS — F05 Delirium due to known physiological condition: Secondary | ICD-10-CM | POA: Diagnosis present

## 2017-10-31 DIAGNOSIS — M109 Gout, unspecified: Secondary | ICD-10-CM | POA: Diagnosis present

## 2017-10-31 DIAGNOSIS — Z794 Long term (current) use of insulin: Secondary | ICD-10-CM | POA: Diagnosis not present

## 2017-10-31 DIAGNOSIS — N183 Chronic kidney disease, stage 3 (moderate): Secondary | ICD-10-CM | POA: Diagnosis not present

## 2017-10-31 DIAGNOSIS — N179 Acute kidney failure, unspecified: Secondary | ICD-10-CM | POA: Diagnosis not present

## 2017-10-31 DIAGNOSIS — E872 Acidosis: Secondary | ICD-10-CM | POA: Diagnosis present

## 2017-10-31 DIAGNOSIS — Z96651 Presence of right artificial knee joint: Secondary | ICD-10-CM | POA: Diagnosis present

## 2017-10-31 DIAGNOSIS — J9601 Acute respiratory failure with hypoxia: Secondary | ICD-10-CM | POA: Diagnosis not present

## 2017-10-31 DIAGNOSIS — I10 Essential (primary) hypertension: Secondary | ICD-10-CM | POA: Diagnosis not present

## 2017-10-31 DIAGNOSIS — G9340 Encephalopathy, unspecified: Secondary | ICD-10-CM | POA: Diagnosis not present

## 2017-10-31 DIAGNOSIS — A0472 Enterocolitis due to Clostridium difficile, not specified as recurrent: Secondary | ICD-10-CM | POA: Diagnosis present

## 2017-10-31 DIAGNOSIS — E878 Other disorders of electrolyte and fluid balance, not elsewhere classified: Secondary | ICD-10-CM | POA: Diagnosis present

## 2017-10-31 DIAGNOSIS — Z6841 Body Mass Index (BMI) 40.0 and over, adult: Secondary | ICD-10-CM | POA: Diagnosis not present

## 2017-10-31 DIAGNOSIS — R32 Unspecified urinary incontinence: Secondary | ICD-10-CM | POA: Diagnosis present

## 2017-10-31 DIAGNOSIS — A419 Sepsis, unspecified organism: Secondary | ICD-10-CM | POA: Diagnosis present

## 2017-10-31 DIAGNOSIS — E43 Unspecified severe protein-calorie malnutrition: Secondary | ICD-10-CM | POA: Diagnosis present

## 2017-10-31 DIAGNOSIS — E662 Morbid (severe) obesity with alveolar hypoventilation: Secondary | ICD-10-CM | POA: Diagnosis present

## 2017-10-31 DIAGNOSIS — J810 Acute pulmonary edema: Secondary | ICD-10-CM | POA: Diagnosis not present

## 2017-10-31 DIAGNOSIS — E1165 Type 2 diabetes mellitus with hyperglycemia: Secondary | ICD-10-CM | POA: Diagnosis not present

## 2017-10-31 LAB — CBC
HCT: 25 % — ABNORMAL LOW (ref 36.0–46.0)
HEMOGLOBIN: 7.4 g/dL — AB (ref 12.0–15.0)
MCH: 20.1 pg — AB (ref 26.0–34.0)
MCHC: 29.6 g/dL — ABNORMAL LOW (ref 30.0–36.0)
MCV: 67.8 fL — AB (ref 80.0–100.0)
NRBC: 0.5 % — AB (ref 0.0–0.2)
PLATELETS: 247 10*3/uL (ref 150–400)
RBC: 3.69 MIL/uL — AB (ref 3.87–5.11)
RDW: 16.4 % — ABNORMAL HIGH (ref 11.5–15.5)
WBC: 7.9 10*3/uL (ref 4.0–10.5)

## 2017-10-31 LAB — COMPREHENSIVE METABOLIC PANEL
ALBUMIN: 2.5 g/dL — AB (ref 3.5–5.0)
ALT: 21 U/L (ref 0–44)
ANION GAP: 12 (ref 5–15)
AST: 20 U/L (ref 15–41)
Alkaline Phosphatase: 132 U/L — ABNORMAL HIGH (ref 38–126)
BUN: 75 mg/dL — ABNORMAL HIGH (ref 8–23)
CHLORIDE: 102 mmol/L (ref 98–111)
CO2: 16 mmol/L — AB (ref 22–32)
Calcium: 7.4 mg/dL — ABNORMAL LOW (ref 8.9–10.3)
Creatinine, Ser: 4.28 mg/dL — ABNORMAL HIGH (ref 0.44–1.00)
GFR calc non Af Amer: 9 mL/min — ABNORMAL LOW (ref >60)
GFR, EST AFRICAN AMERICAN: 10 mL/min — AB (ref >60)
GLUCOSE: 148 mg/dL — AB (ref 70–99)
POTASSIUM: 4.4 mmol/L (ref 3.5–5.1)
SODIUM: 130 mmol/L — AB (ref 135–145)
Total Bilirubin: 0.7 mg/dL (ref 0.3–1.2)
Total Protein: 6.2 g/dL — ABNORMAL LOW (ref 6.5–8.1)

## 2017-10-31 LAB — GASTROINTESTINAL PANEL BY PCR, STOOL (REPLACES STOOL CULTURE)
ASTROVIRUS: NOT DETECTED
Adenovirus F40/41: NOT DETECTED
Campylobacter species: NOT DETECTED
Cryptosporidium: NOT DETECTED
Cyclospora cayetanensis: NOT DETECTED
ENTAMOEBA HISTOLYTICA: NOT DETECTED
ENTEROAGGREGATIVE E COLI (EAEC): NOT DETECTED
ENTEROTOXIGENIC E COLI (ETEC): NOT DETECTED
Enteropathogenic E coli (EPEC): NOT DETECTED
GIARDIA LAMBLIA: NOT DETECTED
NOROVIRUS GI/GII: NOT DETECTED
Plesimonas shigelloides: NOT DETECTED
Rotavirus A: NOT DETECTED
Salmonella species: NOT DETECTED
Sapovirus (I, II, IV, and V): NOT DETECTED
Shiga like toxin producing E coli (STEC): NOT DETECTED
Shigella/Enteroinvasive E coli (EIEC): NOT DETECTED
VIBRIO CHOLERAE: NOT DETECTED
VIBRIO SPECIES: NOT DETECTED
Yersinia enterocolitica: NOT DETECTED

## 2017-10-31 LAB — GLUCOSE, CAPILLARY
Glucose-Capillary: 150 mg/dL — ABNORMAL HIGH (ref 70–99)
Glucose-Capillary: 144 mg/dL — ABNORMAL HIGH (ref 70–99)
Glucose-Capillary: 133 mg/dL — ABNORMAL HIGH (ref 70–99)
Glucose-Capillary: 185 mg/dL — ABNORMAL HIGH (ref 70–99)

## 2017-10-31 MED ORDER — HYDROCODONE-ACETAMINOPHEN 5-325 MG PO TABS
1.0000 | ORAL_TABLET | ORAL | Status: DC | PRN
  Administered 2017-10-31 – 2017-11-02 (×9): 1 via ORAL
  Filled 2017-10-31 (×10): qty 1

## 2017-10-31 MED ORDER — SODIUM CHLORIDE 0.9 % IV BOLUS: 1500.0000 mL | Freq: Once | INTRAVENOUS | Status: DC

## 2017-10-31 MED ORDER — SODIUM CHLORIDE 0.9 % IV SOLN
1.0000 g | INTRAVENOUS | Status: DC
  Filled 2017-10-31: qty 10

## 2017-10-31 MED ORDER — SODIUM CHLORIDE 0.9 % IV SOLN
2.0000 g | INTRAVENOUS | Status: DC
  Administered 2017-10-31 – 2017-11-01 (×2): 2 g via INTRAVENOUS
  Filled 2017-10-31 (×2): qty 2
  Filled 2017-10-31: qty 20

## 2017-10-31 MED ORDER — SODIUM CHLORIDE 0.9 % IV BOLUS: 1000.0000 mL | Freq: Once | INTRAVENOUS | Status: DC

## 2017-10-31 MED ORDER — SODIUM CHLORIDE 0.9 % IV BOLUS
500.0000 mL | Freq: Once | INTRAVENOUS | Status: AC
  Administered 2017-10-31: 500 mL via INTRAVENOUS

## 2017-10-31 MED ORDER — VANCOMYCIN 50 MG/ML ORAL SOLUTION: 125.0000 mg | Freq: Four times a day (QID) | ORAL | Status: DC

## 2017-10-31 NOTE — Progress Notes (Signed)
[facility] PROGRESS NOTE    Progress Note  Lauren Crosby  BTD:[id number] DOB: 08/21/1931 DOA: 10/30/2017 PCP: [name], MD     Brief Narrative:   Lauren Crosby is an 84 y.o. female past medical history significant for diabetes mellitus, chronic kidney disease stage III, hypertension morbid obesity chronic lower extremity swelling presents with chief complaints of diarrhea and weakness, CT scan of the abdomen and pelvis revealed wall thickening of the right moist sigmoid with mild pericolonic infiltrate changes  Assessment/Plan:   Acute kidney injury on chronic kidney disease stage III: With a baseline creatinine of less than 1.  On admission 3.9 in the setting of IV Lasix use. Creatinine continues to rise, despite IV fluids.  She is 3.3 L positive. We will continue IV fluids at a lower rate. Urinary sodium and urinary creatinine were not checked on admission. Concern about possible ATN as creatinine continues to rise despite IV fluids. He still complains she is tired. Physical therapy evaluation ordered.  Acute colitis: CT scan of the abdomen and pelvis done on 10/30/2017 showed diffuse colonic diverticulosis with some wall thickening in the rectum and sigmoid with mild pericolic infiltrates GI pathogens was sent C. difficile antigen was positive toxin negative and toxigenic C. difficile PCR was positive, will discontinue IV Cipro start her on IV Rocephin continue IV Flagyl her diarrhea have improved.  Anemia of chronic disease: Hemoglobin today 7.4 she is asymptomatic we will continue to monitor. Check a CBC tomorrow morning  Diabetes mellitus type 2: She was started on long-acting insulin plus sliding scale.  Chronic diastolic heart failure: Lasix have been held due to above she does have some chronic lower extremity swelling.  Tachypnea: Satting well on room air has some scant expiratory wheezing continue DuoNeb's, chest x-ray shows some slight  pulmonary edema. Chest x-ray this afternoon  Essential HTN (hypertension)  Hold antihypertensive medication   DVT prophylaxis: lovenox Family Communication:none Disposition Plan/Barrier to D/C: home in 4 days Code Status:     Code Status Orders  (From admission, onward)         Start     Ordered   10/30/17 1416  Full code  Continuous     10/30/17 1415        Code Status History    Date Active Date Inactive Code Status Order ID Comments User Context   05/22/2017 2250 06/01/2017 1900 Full Code [id number]  Etta Quill, DO ED   01/13/2017 0250 01/15/2017 1622 Full Code [id number]  Jani Gravel, MD ED   08/11/2013 1955 08/13/2013 1559 Full Code [id number]  Babish, Lucille Passy, PA-C Inpatient        IV Access:    Peripheral IV   Procedures and diagnostic studies:   Ct Abdomen Pelvis Wo Contrast  Result Date: 10/30/2017 CLINICAL DATA:  Abdominal distension, diarrhea for 5 days, blood in diarrhea, unable to control bladder or bowel, history of adrenal adenoma, diabetes mellitus, hypertension, nephrolithiasis, renal failure EXAM: CT ABDOMEN AND PELVIS WITHOUT CONTRAST TECHNIQUE: Multidetector CT imaging of the abdomen and pelvis was performed following the standard protocol without IV contrast. Sagittal and coronal MPR images reconstructed from axial data set. Patient drank dilute oral contrast for exam COMPARISON:  05/24/2017 FINDINGS: Lower chest: Bibasilar atelectasis and peribronchial thickening Hepatobiliary: Gallbladder surgically absent. No focal hepatic abnormalities Pancreas: Pancreatic atrophy without mass Spleen: Normal appearance Adrenals/Urinary Tract: Low-attenuation LEFT adrenal mass 3.1 x 2.6 cm consistent with adenoma. RIGHT adrenal gland unremarkable. BILATERAL renal cortical atrophy. Exophytic  intermediate attenuation mass lateral aspect of inferior RIGHT kidney, 3.7 x 3.6 cm image 56. Minimal prominence of LEFT renal pelvis and LEFT ureter in the pelvis without  obstructing calculus. RIGHT ureter and bladder normal appearance. Stomach/Bowel: Appendix surgically absent by history. Scattered colonic diverticulosis. Wall thickening of sigmoid colon with mild pericolic infiltrative changes. Additional wall thickening at the rectosigmoid colon. Findings could represent acute diverticulitis or colitis. No abscess or extraluminal gas. Stomach and small bowel loops unremarkable. Vascular/Lymphatic: Atherosclerotic calcifications aorta and iliac arteries. Coronary arterial calcifications. Aorta normal caliber. No adenopathy. Reproductive: Uterus surgically absent.  Normal appearing ovaries. Other: No free air or free fluid. Tiny LEFT parasagittal ventral hernia inferior to the umbilicus. Musculoskeletal: Demineralized with degenerative disc and facet disease changes lumbar spine. IMPRESSION: Diffuse colonic diverticulosis. Wall thickening of the rectosigmoid colon with mild pericolic infiltrative changes, question diverticulitis versus colitis. No evidence of perforation or abscess. LEFT adrenal adenoma 3.1 x 2.6 cm. Grossly unchanged appearance of an exophytic mass 3.7 x 3.6 cm at the lateral RIGHT kidney, question complicated cyst; lesion is unchanged since 05/24/2017 but minimally larger than seen on 02/10/2015 when it measured 3.5 x 3.2 cm, recommended attention on follow-up imaging to establish long-term stability. Electronically Signed   By: Lavonia Dana M.D.   On: 10/30/2017 13:03   Dg Chest Port 1 View  Result Date: 10/30/2017 CLINICAL DATA:  84 year old female with constipation and watery diarrhea. Initial encounter. EXAM: PORTABLE CHEST 1 VIEW COMPARISON:  05/27/2017. FINDINGS: Cardiomegaly. Asymmetric mild pulmonary edema superimposed upon chronic changes. Calcified aorta. IMPRESSION: 1. Asymmetric mild pulmonary edema. 2. Cardiomegaly. 3.  Aortic Atherosclerosis (ICD10-I70.0). Electronically Signed   By: Genia Del M.D.   On: 10/30/2017 15:06     Medical  Consultants:    None.  Anti-Infectives:   IV Rocephin and Flagyl  Subjective:    Lauren Crosby she relates she feels about the same, diarrhea is improved.  Objective:    Vitals:   10/30/17 1947 10/31/17 0556 10/31/17 0800 10/31/17 0901  BP: (!) 117/42 (!) 130/46 (!) 122/42   Pulse: 86 79  81  Resp: (!) 25 (!) 22    Temp: 98.6 F (37 C) 98.5 F (36.9 C)    TempSrc: Oral Oral    SpO2: 100% 98%    Weight:      Height:        Intake/Output Summary (Last 24 hours) at 10/31/2017 0946 Last data filed at 10/31/2017 4270 Gross per 24 hour  Intake 3721.93 ml  Output 400 ml  Net 3321.93 ml   Filed Weights   10/30/17 0729  Weight: 115.7 kg    Exam: General exam: In no acute distress. Respiratory system: Good air movement and clear to auscultation. Cardiovascular system: S1 & S2 heard, RRR.  Gastrointestinal system: Abdomen is nondistended, soft and nontender.  Central nervous system: Alert and oriented. No focal neurological deficits. Extremities: 2+ woody edema with chronic venous stasis changes Skin: No rashes, lesions or ulcers Psychiatry: Judgement and insight appear normal. Mood & affect appropriate.    Data Reviewed:    Labs: Basic Metabolic Panel: Recent Labs  Lab 10/30/17 0815 10/31/17 0631  NA 131* 130*  K 4.5 4.4  CL 101 102  CO2 18* 16*  GLUCOSE 148* 148*  BUN 81* 75*  CREATININE 3.96* 4.28*  CALCIUM 8.2* 7.4*   GFR Estimated Creatinine Clearance: 11.8 mL/min (A) (by C-G formula based on SCr of 4.28 mg/dL (H)). Liver Function Tests: Recent Labs  Lab  10/30/17 0815 10/31/17 0631  AST 28 20  ALT 24 21  ALKPHOS 151* 132*  BILITOT 0.5 0.7  PROT 7.4 6.2*  ALBUMIN 2.9* 2.5*   Recent Labs  Lab 10/30/17 0815  LIPASE 23   No results for input(s): AMMONIA in the last 168 hours. Coagulation profile No results for input(s): INR, PROTIME in the last 168 hours.  CBC: Recent Labs  Lab 10/30/17 0815 10/31/17 0631  WBC 10.5 7.9    NEUTROABS 8.3*  --   HGB 8.2* 7.4*  HCT 27.2* 25.0*  MCV 67.3* 67.8*  PLT 264 247   Cardiac Enzymes: No results for input(s): CKTOTAL, CKMB, CKMBINDEX, TROPONINI in the last 168 hours. BNP (last 3 results) No results for input(s): PROBNP in the last 8760 hours. CBG: Recent Labs  Lab 10/30/17 1648 10/30/17 2054 10/31/17 0748  GLUCAP 167* 175* 150*   D-Dimer: No results for input(s): DDIMER in the last 72 hours. Hgb A1c: No results for input(s): HGBA1C in the last 72 hours. Lipid Profile: No results for input(s): CHOL, HDL, LDLCALC, TRIG, CHOLHDL, LDLDIRECT in the last 72 hours. Thyroid function studies: No results for input(s): TSH, T4TOTAL, T3FREE, THYROIDAB in the last 72 hours.  Invalid input(s): FREET3 Anemia work up: No results for input(s): VITAMINB12, FOLATE, FERRITIN, TIBC, IRON, RETICCTPCT in the last 72 hours. Sepsis Labs: Recent Labs  Lab 10/30/17 0815 10/31/17 0631  WBC 10.5 7.9   Microbiology Recent Results (from the past 240 hour(s))  C difficile quick scan w PCR reflex     Status: Abnormal   Collection Time: 10/30/17  9:59 AM  Result Value Ref Range Status   C Diff antigen POSITIVE (A) NEGATIVE Final   C Diff toxin NEGATIVE NEGATIVE Final   C Diff interpretation Results are indeterminate. See PCR results.  Final    Comment: Performed at [facility], [place] [address] [place], [place] [address]  C. Diff by PCR, Reflexed     Status: Abnormal   Collection Time: 10/30/17  9:59 AM  Result Value Ref Range Status   Toxigenic C. Difficile by PCR POSITIVE (A) NEGATIVE Final    Comment: Positive for toxigenic C. difficile with little to no toxin production. Only treat if clinical presentation suggests symptomatic illness. Performed at [facility], [place] [address], [place], [place] [address]      Medications:   . atorvastatin  40 mg Oral q1800  . insulin aspart  0-15 Units Subcutaneous TID WC  . insulin aspart  0-5 Units  Subcutaneous QHS  . insulin glargine  25 Units Subcutaneous QHS  . metoprolol tartrate  25 mg Oral BID  . multivitamin  1 tablet Oral Daily  . multivitamin with minerals  1 tablet Oral Daily  . vancomycin  125 mg Oral QID   Continuous Infusions: . sodium chloride 125 mL/hr at 10/31/17 0106  . sodium chloride Stopped (10/30/17 1737)  . ciprofloxacin 400 mg (10/30/17 1446)  . metronidazole 500 mg (10/31/17 0086)      LOS: 0 days   Charlynne Cousins  [facility] Pager [phone]  *Please refer to [email or web address], password [email or web address] to get updated schedule on who will round on this patient, as hospitalists switch teams weekly. If 7PM-7AM, please contact night-coverage at [email or web address], password [email or web address] for any overnight needs.  10/31/2017, 9:46 AM

## 2017-11-01 LAB — URINALYSIS, ROUTINE W REFLEX MICROSCOPIC
BILIRUBIN URINE: NEGATIVE
GLUCOSE, UA: NEGATIVE mg/dL
Ketones, ur: NEGATIVE mg/dL
NITRITE: NEGATIVE
Protein, ur: >=300 mg/dL — AB
SPECIFIC GRAVITY, URINE: 1.013 (ref 1.005–1.030)
WBC, UA: >50 WBC/hpf — ABNORMAL HIGH (ref 0–5)
pH: 5 (ref 5.0–8.0)

## 2017-11-01 LAB — BASIC METABOLIC PANEL
Anion gap: 13 (ref 5–15)
BUN: 81 mg/dL — AB (ref 8–23)
CALCIUM: 7.5 mg/dL — AB (ref 8.9–10.3)
CHLORIDE: 104 mmol/L (ref 98–111)
CO2: 16 mmol/L — ABNORMAL LOW (ref 22–32)
CREATININE: 5.57 mg/dL — AB (ref 0.44–1.00)
GFR, EST AFRICAN AMERICAN: 7 mL/min — AB (ref >60)
GFR, EST NON AFRICAN AMERICAN: 6 mL/min — AB (ref >60)
Glucose, Bld: 266 mg/dL — ABNORMAL HIGH (ref 70–99)
Potassium: 4.5 mmol/L (ref 3.5–5.1)
Sodium: 133 mmol/L — ABNORMAL LOW (ref 135–145)

## 2017-11-01 LAB — CBC
HEMATOCRIT: 26.9 % — AB (ref 36.0–46.0)
Hemoglobin: 7.9 g/dL — ABNORMAL LOW (ref 12.0–15.0)
MCH: 20.1 pg — ABNORMAL LOW (ref 26.0–34.0)
MCHC: 29.4 g/dL — AB (ref 30.0–36.0)
MCV: 68.4 fL — ABNORMAL LOW (ref 80.0–100.0)
NRBC: 0.5 % — AB (ref 0.0–0.2)
PLATELETS: 241 10*3/uL (ref 150–400)
RBC: 3.93 MIL/uL (ref 3.87–5.11)
RDW: 16.9 % — AB (ref 11.5–15.5)
WBC: 7.7 10*3/uL (ref 4.0–10.5)

## 2017-11-01 LAB — GLUCOSE, CAPILLARY
GLUCOSE-CAPILLARY: 218 mg/dL — AB (ref 70–99)
GLUCOSE-CAPILLARY: 247 mg/dL — AB (ref 70–99)
Glucose-Capillary: 225 mg/dL — ABNORMAL HIGH (ref 70–99)

## 2017-11-01 LAB — SODIUM, URINE, RANDOM: SODIUM UR: 110 mmol/L

## 2017-11-01 LAB — PHOSPHORUS: PHOSPHORUS: 7.4 mg/dL — AB (ref 2.5–4.6)

## 2017-11-01 LAB — CK: CK TOTAL: 247 U/L — AB (ref 38–234)

## 2017-11-01 NOTE — Progress Notes (Signed)
Inpatient Diabetes Program Recommendations  AACE/ADA: New Consensus Statement on Inpatient Glycemic Control (2015)  Target Ranges:  Prepandial:   less than 140 mg/dL      Peak postprandial:   less than 180 mg/dL (1-2 hours)      Critically ill patients:  140 - 180 mg/dL   Results for JONIKA, CRITZ (MRN [id number]) as of 11/01/2017 14:18  Ref. Range 10/31/2017 07:48 10/31/2017 12:09 10/31/2017 17:01 10/31/2017 21:03  Glucose-Capillary Latest Ref Range: 70 - 99 mg/dL 150 (H)  2 units NOVOLOG  144 (H)  2 units NOVOLOG  133 (H)  2 units NOVOLOG  185 (H)    25 units LANTUS   Results for MAGNOLIA, MATTILA (MRN [id number]) as of 11/01/2017 14:18  Ref. Range 11/01/2017 07:33 11/01/2017 12:16  Glucose-Capillary Latest Ref Range: 70 - 99 mg/dL 218 (H)  5 units NOVOLOG  247 (H)  5 units NOVOLOG     Admit Diarrhea/ Acute Colitis/ Acute on Chronic Renal Failure (Creatinine elevated this admission).   History: DM, CKD  Home DM Meds: 70/30 Insulin- 45 units AM/ 50 units PM  Current Orders: Lantus 25 units QHS       Novolog Moderate Correction Scale/ SSI (0-15 units) TID AC + HS     CBGs elevated today.  Takes fairly large doses of 70/30 Insulin at home.    MD- Please consider the following in-hospital insulin adjustments:  1. Increase Lantus to 30 units QHS  2. Start Novolog Meal Coverage: Novolog 4 units TID with meals  (Please add the following Hold Parameters: Hold if pt eats <50% of meal, Hold if pt NPO)     --Will follow patient during hospitalization--  Wyn Quaker RN, MSN, CDE Diabetes Coordinator Inpatient Glycemic Control Team Team Pager: [phone] (8a-5p)

## 2017-11-01 NOTE — Progress Notes (Signed)
Foley was placed at 1100 as MD ordered. Patient was tolerant well with procedure. Urine returned. Patient was educated about foley catheter and foley care.

## 2017-11-01 NOTE — Consult Note (Signed)
Lauren Crosby Admit Date: 10/30/2017 11/01/2017 Rexene Agent Requesting Physician:  Aileen Fass   Reason for Consult:  AKI HPI:  84 year old female admitted 10/30/2017 with profuse watery diarrhea.  Preceding she had constipation was taking Dulcolax and increasing doses until developed the presenting problem.  Past history also includes diabetes, hypertension, hyperlipidemia, chronic lower extremity edema.  Baseline creatinine appears to be around 1.0-1.2.  Home medications include furosemide, notably no inhibitors of renin/angiotensin/aldosterone.  Upon presentation patient noted to have a creatinine of 4.  Potassium was 4.5 and serum bicarbonate was 18 with an anion gap of 12.  Hemoglobin 8.2 with profound microcytosis.  Patient had a noncontrasted CT of the abdomen which was noteworthy for bilateral renal cortical atrophy, no significant obvious obstruction.  There was some concern for left-sided diverticulitis or colitis.  She has been treated with ceftriaxone and metronidazole.  Testing for C. difficile has been negative for the toxin but positive for the antigen and PCR.  Patient was admitted and aggressively hydrated with normal saline.  She is 5.5 L negative.  Urine output has been difficult to measure, she has been incontinent.  Foley catheter was placed today.  No urine analysis to date.  Order pending.  Patient is alone in the room.  She is a poor historian.  She is uncertain if she was taking any over-the-counter pain medicines prior to presentation.    Creat (mg/dL)  Date Value  08/14/2012 0.85   Creatinine, Ser (mg/dL)  Date Value  11/01/2017 5.57 (H)  10/31/2017 4.28 (H)  10/30/2017 3.96 (H)  06/01/2017 1.38 (H)  06/01/2017 1.41 (H)  05/31/2017 1.22 (H)  05/30/2017 1.20 (H)  05/29/2017 1.03 (H)  05/28/2017 1.03 (H)  05/27/2017 0.96  ] I/Os: I/O last 3 completed shifts: In: 2846.2 [P.O.:270; I.V.:1880; IV Piggyback:696.2] Out: 400 [Urine:400]    ROS NSAIDS: Uncertain IV Contrast no exposure TMP/SMX no identified exposure Hypotension not present Balance of 12 systems is negative w/ exceptions as above  PMH  Past Medical History:  Diagnosis Date  . Adrenal adenoma   . Anemia   . Arthritis    OA AND PAIN BOTH KNEES BUT RIGHT KNEE PAIN WORSE; SOME LOWER BACK PAIN  . Cellulitis and abscess of trunk   . Colon polyp    adenomatous  . Diabetes ([place])    type 2  PT IS ON ORAL MEDICATION AND INSULIN  . Diverticulitis   . GERD (gastroesophageal reflux disease)    NO MEDS FOR GERD  . Goiter   . Gout   . Hepatitis    YELLOW JAUNDICE WHEN A CHILD  . Hernia   . Hyperlipemia   . Hypertension   . Nephrolithiasis   . 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 Obesity   . Open wound of abdominal wall, anterior, without mention of complication   . Pneumonia    IN THE PAST  . Retinopathy due to secondary diabetes mellitus ([place])   . Shortness of breath    PT IS NOT ABLE TO BE ACTIVE BECAUSE OF KNEE PROBLEM; HAS ALWAYS HAD PROBLEM WITH BREATHING HEAVY - DOES GET SOB WITH EXERTION.  Marland Kitchen Thalassemia minor    PSH  Past Surgical History:  Procedure Laterality Date  . APPENDECTOMY     1993 PT HAD APPENDECTOMY AND ABDOMINAL SURGERY FRO DIVERTICULITS  . CARDIAC CATHETERIZATION  2008   conclusion: smooth and normal coronary arteries, normal left ventricular systolic function  . CHOLECYSTECTOMY    .  COLONOSCOPY  10/04/06  . CYSTOSCOPY WITH BIOPSY N/A 06/16/2016   Procedure: CYSTOSCOPY WITH  BLADDER BIOPSY AND FULGERATION 1CM;  Surgeon: Festus Aloe, MD;  Location: [facility];  Service: Urology;  Laterality: N/A;  . DIAGNOSTIC MAMMOGRAM  03/17/2011  . ROTATOR CUFF REPAIR Left   . SIGMOID RESECTION / RECTOPEXY    . TONSILLECTOMY    . TOTAL ABDOMINAL HYSTERECTOMY    . TOTAL KNEE ARTHROPLASTY Right 08/11/2013   Procedure: RIGHT TOTAL KNEE ARTHROPLASTY;  Surgeon: Mauri Pole, MD;  Location: [facility];  Service: Orthopedics;   Laterality: Right;  . VENTRAL HERNIA REPAIR  2007   FH  Family History  Problem Relation Age of Onset  . Diabetes Father   . Hypertension Father   . Kidney disease Father   . Liver disease Mother   . Hypertension Unknown   . Rheum arthritis Sister   . Diverticulitis Sister   . Hypertension Sister   . Colon cancer Neg Hx   . Colon polyps Neg Hx   . Esophageal cancer Neg Hx   . Pancreatic cancer Neg Hx   . Stomach cancer Neg Hx    SH  reports that she has never smoked. She has never used smokeless tobacco. She reports that she does not drink alcohol or use drugs. Allergies  Allergies  Allergen Reactions  . Penicillins Other (See Comments)    Has patient had a PCN reaction causing immediate rash, facial/tongue/throat swelling, SOB or lightheadedness with hypotension: No Has patient had a PCN reaction causing severe rash involving mucus membranes or skin necrosis: No Has patient had a PCN reaction that required hospitalization: No Has patient had a PCN reaction occurring within the last 10 years: Unknown If all of the above answers are "NO", then may proceed with Cephalosporin use.   Home medications Prior to Admission medications   Medication Sig Start Date End Date Taking? Authorizing Provider  allopurinol (ZYLOPRIM) 300 MG tablet Take 300 mg by mouth daily.   Yes [provider]  furosemide (LASIX) 20 MG tablet Take 20 mg by mouth daily.  11/01/16  Yes [provider]  insulin NPH-regular Human (NOVOLIN 70/30) (70-30) 100 UNIT/ML injection 45 units in the am and 50 units at supper   Yes [provider]  metoprolol tartrate (LOPRESSOR) 25 MG tablet Take 1 tablet (25 mg total) by mouth 2 (two) times daily. 06/01/17  Yes Velvet Bathe, MD  Multiple Vitamin (MULTIVITAMIN WITH MINERALS) TABS tablet Take 1 tablet by mouth daily.   Yes [provider]  Multiple Vitamins-Minerals (PRESERVISION AREDS 2+MULTI VIT PO) Take 1 capsule by mouth 2 (two) times  daily.    Yes [provider]  simvastatin (ZOCOR) 80 MG tablet Take 80 mg by mouth at bedtime.    Yes [provider]  esomeprazole (NEXIUM) 40 MG capsule Take 40 mg by mouth daily. 01/05/17   [provider]  guaiFENesin-dextromethorphan (ROBITUSSIN DM) 100-10 MG/5ML syrup Take 5 mLs by mouth every 4 (four) hours as needed for cough. Patient not taking: Reported on 10/30/2017 01/14/17   Rosita Fire, MD    Current Medications Scheduled Meds: . atorvastatin  40 mg Oral q1800  . insulin aspart  0-15 Units Subcutaneous TID WC  . insulin aspart  0-5 Units Subcutaneous QHS  . insulin glargine  25 Units Subcutaneous QHS  . metoprolol tartrate  25 mg Oral BID  . multivitamin  1 tablet Oral Daily  . multivitamin with minerals  1 tablet Oral  Daily   Continuous Infusions: . sodium chloride 100 mL/hr at 11/01/17 1227  . sodium chloride Stopped (10/30/17 1737)  . cefTRIAXone (ROCEPHIN)  IV 2 g (11/01/17 1120)  . metronidazole 500 mg (11/01/17 1318)   PRN Meds:.acetaminophen, HYDROcodone-acetaminophen, ipratropium-albuterol  CBC Recent Labs  Lab 10/30/17 0815 10/31/17 0631 11/01/17 0904  WBC 10.5 7.9 7.7  NEUTROABS 8.3*  --   --   HGB 8.2* 7.4* 7.9*  HCT 27.2* 25.0* 26.9*  MCV 67.3* 67.8* 68.4*  PLT 264 247 240   Basic Metabolic Panel Recent Labs  Lab 10/30/17 0815 10/31/17 0631 11/01/17 0904  NA 131* 130* 133*  K 4.5 4.4 4.5  CL 101 102 104  CO2 18* 16* 16*  GLUCOSE 148* 148* 266*  BUN 81* 75* 81*  CREATININE 3.96* 4.28* 5.57*  CALCIUM 8.2* 7.4* 7.5*    Physical Exam  Blood pressure (!) 118/52, pulse 72, temperature 98.2 F (36.8 C), temperature source Oral, resp. rate 20, height 5\' 2"  (1.575 m), weight 115.7 kg, SpO2 97 %. GEN: Obese, elderly, sleeping in the bed, mouth open, audible expiratory wheezing ENT: Poor dentition EYES: EOMI CV: Regular, normal S1 and S2, no rub PULM: No wheezing present with auscultation, mild increased  respiratory rate ABD: Soft, nontender throughout SKIN: Chronic venous stasis changes including hyperpigmentation of the lower extremities EXT: 3+ edema in the lower legs  Assessment 84 year old female with AKI in the setting of acute diarrhea with concern for colitis.  Most likely she was hypovolemic but now I suspect has evolved into ATN.  She has progressive metabolic acidosis driven by renal failure, hyperchloremic crystalloid resuscitation, diarrhea.  There are no immediate indications for dialysis but she is at high risk given how quickly renal function has deteriorated.  1. AKI, BL SCr 1.2; suspect hypovolemic original insult, now ATN 2. Acute diarrhea with CT evidence of colitis, on ceftriaxone and Flagyl 3. Metabolic acidosis with mildly increased anion gap, likely driven by renal failure, IV fluids, diarrhea 4. Microcytic anemia, carriers hx/o thalassemia minor 5. DM2 6. HTN on furosemide as ouptiant  Plan 1. Hold IV fluids, has received volume resuscitation, concern for respiratory compromise 2. Follow-up on urine analysis, check urine sodium 3. Agree with Foley catheter 4. Would not provide sodium bicarbonate at the current time 5. Check CK 6. Check serum phosphorus, given use of laxatives I wonder if she also took phosphate based product 7. No immediate indications for hemodialysis, given her age and tenuous status would need to speak with family before offering 8. Daily weights, Daily Renal Panel, Strict I/Os, Avoid nephrotoxins (NSAIDs, judicious IV Contrast)  9. We will follow along   Pearson Grippe MD [phone] 11/01/2017, 3:18 PM

## 2017-11-01 NOTE — Progress Notes (Signed)
[facility] PROGRESS NOTE    Progress Note  Lauren Crosby  NWG:[id number] DOB: 07/31/1931 DOA: 10/30/2017 PCP: [name], MD     Brief Narrative:   Lauren Crosby is an 84 y.o. female past medical history significant for diabetes mellitus, chronic kidney disease stage III, hypertension morbid obesity chronic lower extremity swelling presents with chief complaints of diarrhea and weakness, CT scan of the abdomen and pelvis revealed wall thickening of the right moist sigmoid with mild pericolonic infiltrate changes  Assessment/Plan:   AKI : With a baseline creatinine of less than 1.   Creatinine continues to rise, despite IV fluids.  She is 5 L L positive. Continue IV fluids..  Insert a Foley as she is incontinent. Concern about possible ATN as creatinine continues to rise despite IV fluids. He still complains she is tired. Physical therapy evaluation ordered.  Acute colitis: CT scan of the abdomen and pelvis done on 10/30/2017 showed diffuse colonic diverticulosis with some wall thickening in the rectum and sigmoid with mild pericolic infiltrates GI pathogens which was negative.  She has remained afebrile with no leukocytosis. C. difficile antigen was positive toxin negative and toxigenic C. difficile PCR was positive, she is currently on IV Rocephin and Flagyl, her bowel movements have improved. As per nurse the frequency of the bowel movements have improved. She has not had a bowel movement this morning.  Anemia of chronic disease: Hemoglobin today 7.4 she is asymptomatic we will continue to monitor. Check a CBC tomorrow morning  Diabetes mellitus type 2: She was started on long-acting insulin plus sliding scale.  Chronic diastolic heart failure: Lasix have been held due to above she does have some chronic lower extremity swelling.  Tachypnea: Satting well on room air has some scant expiratory wheezing continue DuoNeb's, chest x-ray shows some slight  pulmonary edema. Chest x-ray this afternoon  Essential HTN (hypertension)  Hold antihypertensive medication   DVT prophylaxis: lovenox Family Communication:none Disposition Plan/Barrier to D/C: Unable to determine. Code Status:     Code Status Orders  (From admission, onward)         Start     Ordered   10/30/17 1416  Full code  Continuous     10/30/17 1415        Code Status History    Date Active Date Inactive Code Status Order ID Comments User Context   05/22/2017 2250 06/01/2017 1900 Full Code [id number]  Etta Quill, DO ED   01/13/2017 0250 01/15/2017 1622 Full Code [id number]  Jani Gravel, MD ED   08/11/2013 1955 08/13/2013 1559 Full Code [id number]  Babish, Lucille Passy, PA-C Inpatient        IV Access:    Peripheral IV   Procedures and diagnostic studies:   Ct Abdomen Pelvis Wo Contrast  Result Date: 10/30/2017 CLINICAL DATA:  Abdominal distension, diarrhea for 5 days, blood in diarrhea, unable to control bladder or bowel, history of adrenal adenoma, diabetes mellitus, hypertension, nephrolithiasis, renal failure EXAM: CT ABDOMEN AND PELVIS WITHOUT CONTRAST TECHNIQUE: Multidetector CT imaging of the abdomen and pelvis was performed following the standard protocol without IV contrast. Sagittal and coronal MPR images reconstructed from axial data set. Patient drank dilute oral contrast for exam COMPARISON:  05/24/2017 FINDINGS: Lower chest: Bibasilar atelectasis and peribronchial thickening Hepatobiliary: Gallbladder surgically absent. No focal hepatic abnormalities Pancreas: Pancreatic atrophy without mass Spleen: Normal appearance Adrenals/Urinary Tract: Low-attenuation LEFT adrenal mass 3.1 x 2.6 cm consistent with adenoma. RIGHT adrenal gland unremarkable. BILATERAL renal cortical  atrophy. Exophytic intermediate attenuation mass lateral aspect of inferior RIGHT kidney, 3.7 x 3.6 cm image 56. Minimal prominence of LEFT renal pelvis and LEFT ureter in the pelvis without  obstructing calculus. RIGHT ureter and bladder normal appearance. Stomach/Bowel: Appendix surgically absent by history. Scattered colonic diverticulosis. Wall thickening of sigmoid colon with mild pericolic infiltrative changes. Additional wall thickening at the rectosigmoid colon. Findings could represent acute diverticulitis or colitis. No abscess or extraluminal gas. Stomach and small bowel loops unremarkable. Vascular/Lymphatic: Atherosclerotic calcifications aorta and iliac arteries. Coronary arterial calcifications. Aorta normal caliber. No adenopathy. Reproductive: Uterus surgically absent.  Normal appearing ovaries. Other: No free air or free fluid. Tiny LEFT parasagittal ventral hernia inferior to the umbilicus. Musculoskeletal: Demineralized with degenerative disc and facet disease changes lumbar spine. IMPRESSION: Diffuse colonic diverticulosis. Wall thickening of the rectosigmoid colon with mild pericolic infiltrative changes, question diverticulitis versus colitis. No evidence of perforation or abscess. LEFT adrenal adenoma 3.1 x 2.6 cm. Grossly unchanged appearance of an exophytic mass 3.7 x 3.6 cm at the lateral RIGHT kidney, question complicated cyst; lesion is unchanged since 05/24/2017 but minimally larger than seen on 02/10/2015 when it measured 3.5 x 3.2 cm, recommended attention on follow-up imaging to establish long-term stability. Electronically Signed   By: Lavonia Dana M.D.   On: 10/30/2017 13:03   Dg Chest Port 1 View  Result Date: 10/31/2017 CLINICAL DATA:  84 year old female with shortness of breath. EXAM: PORTABLE CHEST 1 VIEW COMPARISON:  Portable chest 10/30/2017 and earlier. CT Abdomen and Pelvis 10/30/2017. FINDINGS: There did not appear to be overt pulmonary edema at the lung bases yesterday by CT. Lower lobe bronchiectasis and peribronchial thickening was noted. Continued low lung volumes stable appearance of the pulmonary interstitium today from May this year. Stable mild  cardiomegaly. Visualized tracheal air column is within normal limits. No pneumothorax, pleural effusion or confluent opacity. Large body habitus. No acute osseous abnormality identified. IMPRESSION: Persistent low lung volumes. No focal pulmonary abnormality. Consider viral or atypical respiratory infection given the appearance of the lung bases on CT Abdomen and Pelvis yesterday. Electronically Signed   By: Genevie Ann M.D.   On: 10/31/2017 13:18   Dg Chest Port 1 View  Result Date: 10/30/2017 CLINICAL DATA:  84 year old female with constipation and watery diarrhea. Initial encounter. EXAM: PORTABLE CHEST 1 VIEW COMPARISON:  05/27/2017. FINDINGS: Cardiomegaly. Asymmetric mild pulmonary edema superimposed upon chronic changes. Calcified aorta. IMPRESSION: 1. Asymmetric mild pulmonary edema. 2. Cardiomegaly. 3.  Aortic Atherosclerosis (ICD10-I70.0). Electronically Signed   By: Genia Del M.D.   On: 10/30/2017 15:06     Medical Consultants:    None.  Anti-Infectives:   IV Rocephin and Flagyl  Subjective:    Lauren Crosby she relates she feels about the same, no bowel movements today.  Objective:    Vitals:   10/31/17 1447 10/31/17 2031 11/01/17 0452 11/01/17 0800  BP: (!) 124/44 (!) 125/46 (!) 100/31 (!) 120/56  Pulse: 80 77 78   Resp: (!) 24 17 (!) 24   Temp: 99.1 F (37.3 C) (!) 97.3 F (36.3 C) (!) 97.3 F (36.3 C) 98 F (36.7 C)  TempSrc: Oral Oral Oral Oral  SpO2: 100% 97% 93%   Weight:      Height:        Intake/Output Summary (Last 24 hours) at 11/01/2017 1019 Last data filed at 11/01/2017 0800 Gross per 24 hour  Intake 1892.98 ml  Output -  Net 1892.98 ml   [employer]  10/30/17 0729  Weight: 115.7 kg    Exam: General exam: In no acute distress. Respiratory system: Good air movement and clear to auscultation. Cardiovascular system: S1 & S2 heard, RRR.  Gastrointestinal system: Abdomen is nondistended, soft and nontender.  Central nervous system:  Alert and oriented. No focal neurological deficits. Extremities: 2+ woody edema with chronic venous stasis changes Skin: No rashes, lesions or ulcers    Data Reviewed:    Labs: Basic Metabolic Panel: Recent Labs  Lab 10/30/17 0815 10/31/17 0631 11/01/17 0904  NA 131* 130* 133*  K 4.5 4.4 4.5  CL 101 102 104  CO2 18* 16* 16*  GLUCOSE 148* 148* 266*  BUN 81* 75* 81*  CREATININE 3.96* 4.28* 5.57*  CALCIUM 8.2* 7.4* 7.5*   GFR Estimated Creatinine Clearance: 9.1 mL/min (A) (by C-G formula based on SCr of 5.57 mg/dL (H)). Liver Function Tests: Recent Labs  Lab 10/30/17 0815 10/31/17 0631  AST 28 20  ALT 24 21  ALKPHOS 151* 132*  BILITOT 0.5 0.7  PROT 7.4 6.2*  ALBUMIN 2.9* 2.5*   Recent Labs  Lab 10/30/17 0815  LIPASE 23   No results for input(s): AMMONIA in the last 168 hours. Coagulation profile No results for input(s): INR, PROTIME in the last 168 hours.  CBC: Recent Labs  Lab 10/30/17 0815 10/31/17 0631 11/01/17 0904  WBC 10.5 7.9 7.7  NEUTROABS 8.3*  --   --   HGB 8.2* 7.4* 7.9*  HCT 27.2* 25.0* 26.9*  MCV 67.3* 67.8* 68.4*  PLT 264 247 241   Cardiac Enzymes: No results for input(s): CKTOTAL, CKMB, CKMBINDEX, TROPONINI in the last 168 hours. BNP (last 3 results) No results for input(s): PROBNP in the last 8760 hours. CBG: Recent Labs  Lab 10/31/17 0748 10/31/17 1209 10/31/17 1701 10/31/17 2103 11/01/17 0733  GLUCAP 150* 144* 133* 185* 218*   D-Dimer: No results for input(s): DDIMER in the last 72 hours. Hgb A1c: No results for input(s): HGBA1C in the last 72 hours. Lipid Profile: No results for input(s): CHOL, HDL, LDLCALC, TRIG, CHOLHDL, LDLDIRECT in the last 72 hours. Thyroid function studies: No results for input(s): TSH, T4TOTAL, T3FREE, THYROIDAB in the last 72 hours.  Invalid input(s): FREET3 Anemia work up: No results for input(s): VITAMINB12, FOLATE, FERRITIN, TIBC, IRON, RETICCTPCT in the last 72 hours. Sepsis  Labs: Recent Labs  Lab 10/30/17 0815 10/31/17 0631 11/01/17 0904  WBC 10.5 7.9 7.7   Microbiology Recent Results (from the past 240 hour(s))  Gastrointestinal Panel by PCR , Stool     Status: None   Collection Time: 10/30/17  9:59 AM  Result Value Ref Range Status   Campylobacter species NOT DETECTED NOT DETECTED Final   Plesimonas shigelloides NOT DETECTED NOT DETECTED Final   Salmonella species NOT DETECTED NOT DETECTED Final   Yersinia enterocolitica NOT DETECTED NOT DETECTED Final   Vibrio species NOT DETECTED NOT DETECTED Final   Vibrio cholerae NOT DETECTED NOT DETECTED Final   Enteroaggregative E coli (EAEC) NOT DETECTED NOT DETECTED Final   Enteropathogenic E coli (EPEC) NOT DETECTED NOT DETECTED Final   Enterotoxigenic E coli (ETEC) NOT DETECTED NOT DETECTED Final   Shiga like toxin producing E coli (STEC) NOT DETECTED NOT DETECTED Final   Shigella/Enteroinvasive E coli (EIEC) NOT DETECTED NOT DETECTED Final   Cryptosporidium NOT DETECTED NOT DETECTED Final   Cyclospora cayetanensis NOT DETECTED NOT DETECTED Final   Entamoeba histolytica NOT DETECTED NOT DETECTED Final   Giardia lamblia NOT DETECTED NOT DETECTED  Final   Adenovirus F40/41 NOT DETECTED NOT DETECTED Final   Astrovirus NOT DETECTED NOT DETECTED Final   Norovirus GI/GII NOT DETECTED NOT DETECTED Final   Rotavirus A NOT DETECTED NOT DETECTED Final   Sapovirus (I, II, IV, and V) NOT DETECTED NOT DETECTED Final    Comment: Performed at [facility], [address] [place], [place] [address]  C difficile quick scan w PCR reflex     Status: Abnormal   Collection Time: 10/30/17  9:59 AM  Result Value Ref Range Status   C Diff antigen POSITIVE (A) NEGATIVE Final   C Diff toxin NEGATIVE NEGATIVE Final   C Diff interpretation Results are indeterminate. See PCR results.  Final    Comment: Performed at [facility], [place] [address], [place], [place] [address]  C. Diff by PCR, Reflexed      Status: Abnormal   Collection Time: 10/30/17  9:59 AM  Result Value Ref Range Status   Toxigenic C. Difficile by PCR POSITIVE (A) NEGATIVE Final    Comment: Positive for toxigenic C. difficile with little to no toxin production. Only treat if clinical presentation suggests symptomatic illness. Performed at [facility], [place] [address], [place], [place] [address]      Medications:   . atorvastatin  40 mg Oral q1800  . insulin aspart  0-15 Units Subcutaneous TID WC  . insulin aspart  0-5 Units Subcutaneous QHS  . insulin glargine  25 Units Subcutaneous QHS  . metoprolol tartrate  25 mg Oral BID  . multivitamin  1 tablet Oral Daily  . multivitamin with minerals  1 tablet Oral Daily   Continuous Infusions: . sodium chloride 75 mL/hr at 11/01/17 0800  . sodium chloride Stopped (10/30/17 1737)  . cefTRIAXone (ROCEPHIN)  IV 2 g (10/31/17 1323)  . metronidazole Stopped (11/01/17 0715)      LOS: 1 day   [facility] Pager [phone]  *Please refer to [email or web address], password [email or web address] to get updated schedule on who will round on this patient, as hospitalists switch teams weekly. If 7PM-7AM, please contact night-coverage at [email or web address], password [email or web address] for any overnight needs.  11/01/2017, 10:19 AM

## 2017-11-01 NOTE — Progress Notes (Signed)
PT Cancellation Note  Patient Details Name: Lauren Crosby MRN: [id number] DOB: October 12, 1931   Cancelled Treatment:    Reason Eval/Treat Not Completed: Pain limiting ability to participate RN reports foley catheter just placed and pt requiring significant assist.  RN also reports increased pain, and pt not yet ready to tolerate therapy.  Will check back as schedule permits.   Bryttney Netzer,KATHrine E 11/01/2017, 11:21 AM Carmelia Bake, PT, DPT Acute Rehabilitation Services Office: [phone] Pager: [phone]

## 2017-11-02 LAB — BASIC METABOLIC PANEL
Anion gap: 13 (ref 5–15)
BUN: 89 mg/dL — ABNORMAL HIGH (ref 8–23)
CHLORIDE: 104 mmol/L (ref 98–111)
CO2: 14 mmol/L — ABNORMAL LOW (ref 22–32)
Calcium: 7.5 mg/dL — ABNORMAL LOW (ref 8.9–10.3)
Creatinine, Ser: 6.17 mg/dL — ABNORMAL HIGH (ref 0.44–1.00)
GFR calc non Af Amer: 6 mL/min — ABNORMAL LOW (ref >60)
GFR, EST AFRICAN AMERICAN: 6 mL/min — AB (ref >60)
Glucose, Bld: 172 mg/dL — ABNORMAL HIGH (ref 70–99)
POTASSIUM: 4.6 mmol/L (ref 3.5–5.1)
Sodium: 131 mmol/L — ABNORMAL LOW (ref 135–145)

## 2017-11-02 LAB — GLUCOSE, CAPILLARY
GLUCOSE-CAPILLARY: 209 mg/dL — AB (ref 70–99)
GLUCOSE-CAPILLARY: 163 mg/dL — AB (ref 70–99)
GLUCOSE-CAPILLARY: 257 mg/dL — AB (ref 70–99)
GLUCOSE-CAPILLARY: 193 mg/dL — AB (ref 70–99)
GLUCOSE-CAPILLARY: 229 mg/dL — AB (ref 70–99)

## 2017-11-02 MED ORDER — SODIUM CHLORIDE 0.9 % IV SOLN
2.0000 g | INTRAVENOUS | Status: AC
  Administered 2017-11-02 – 2017-11-05 (×4): 2 g via INTRAVENOUS
  Filled 2017-11-02: qty 2
  Filled 2017-11-02 (×2): qty 20
  Filled 2017-11-02 (×2): qty 2

## 2017-11-02 MED ORDER — HALOPERIDOL LACTATE 5 MG/ML IJ SOLN
2.0000 mg | Freq: Once | INTRAMUSCULAR | Status: AC
  Administered 2017-11-02: 2 mg via INTRAVENOUS
  Filled 2017-11-02: qty 1

## 2017-11-02 MED ORDER — HALOPERIDOL LACTATE 5 MG/ML IJ SOLN
2.0000 mg | Freq: Four times a day (QID) | INTRAMUSCULAR | Status: DC | PRN
  Administered 2017-11-02 – 2017-11-03 (×4): 2 mg via INTRAVENOUS
  Filled 2017-11-02 (×4): qty 1

## 2017-11-02 MED ORDER — QUETIAPINE FUMARATE 50 MG PO TABS
25.0000 mg | ORAL_TABLET | Freq: Every day | ORAL | Status: DC
  Administered 2017-11-02: 25 mg via ORAL
  Filled 2017-11-02: qty 1

## 2017-11-02 NOTE — Progress Notes (Signed)
PT Cancellation Note  Patient Details Name: Lauren Crosby MRN: [id number] DOB: May 12, 1931   Cancelled Treatment:    Reason Eval/Treat Not Completed: Patient not medically ready, RN reports increased agitation .    [name] 11/02/2017, 10:09 AM [place] Pager [phone] Office [phone]

## 2017-11-02 NOTE — Progress Notes (Signed)
S: Groaning in bed but not complaining of pain.   O:BP 112/69 (BP Location: Right Arm)   Pulse 68   Temp 98.2 F (36.8 C) (Oral)   Resp 18   Ht 5\' 2"  (1.575 m)   Wt 115.7 kg   SpO2 95%   BMI 46.64 kg/m   Intake/Output Summary (Last 24 hours) at 11/02/2017 1409 Last data filed at 11/02/2017 0830 Gross per 24 hour  Intake 1816.43 ml  Output 250 ml  Net 1566.43 ml   Intake/Output: I/O last 3 completed shifts: In: 3497.7 [P.O.:120; I.V.:1694.9; IV Piggyback:1682.8] Out: 250 [Urine:250]  Intake/Output this shift:  Total I/O In: 331.7 [P.O.:240; IV Piggyback:91.7] Out: -  Weight change:  Gen: obese, elderly WF lying in bed moaning, dry mucous membranes CVS: no rub Resp: occ rhonchi bilaterally Abd: obese, +BS, soft Ext:2+ edema  Recent Labs  Lab 10/30/17 0815 10/31/17 0631 11/01/17 0904 11/02/17 0556  NA 131* 130* 133* 131*  K 4.5 4.4 4.5 4.6  CL 101 102 104 104  CO2 18* 16* 16* 14*  GLUCOSE 148* 148* 266* 172*  BUN 81* 75* 81* 89*  CREATININE 3.96* 4.28* 5.57* 6.17*  ALBUMIN 2.9* 2.5*  --   --   CALCIUM 8.2* 7.4* 7.5* 7.5*  PHOS  --   --  7.4*  --   AST 28 20  --   --   ALT 24 21  --   --    Liver Function Tests: Recent Labs  Lab 10/30/17 0815 10/31/17 0631  AST 28 20  ALT 24 21  ALKPHOS 151* 132*  BILITOT 0.5 0.7  PROT 7.4 6.2*  ALBUMIN 2.9* 2.5*   Recent Labs  Lab 10/30/17 0815  LIPASE 23   No results for input(s): AMMONIA in the last 168 hours. CBC: Recent Labs  Lab 10/30/17 0815 10/31/17 0631 11/01/17 0904  WBC 10.5 7.9 7.7  NEUTROABS 8.3*  --   --   HGB 8.2* 7.4* 7.9*  HCT 27.2* 25.0* 26.9*  MCV 67.3* 67.8* 68.4*  PLT 264 247 241   Cardiac Enzymes: Recent Labs  Lab 11/01/17 0904  CKTOTAL 247*   CBG: Recent Labs  Lab 11/01/17 1216 11/01/17 1701 11/01/17 2133 11/02/17 0736 11/02/17 1158  GLUCAP 247* 225* 209* 163* 257*    Iron Studies: No results for input(s): IRON, TIBC, TRANSFERRIN, FERRITIN in the last 72  hours. Studies/Results: No results found. Marland Kitchen atorvastatin  40 mg Oral q1800  . insulin aspart  0-15 Units Subcutaneous TID WC  . insulin aspart  0-5 Units Subcutaneous QHS  . insulin glargine  25 Units Subcutaneous QHS  . metoprolol tartrate  25 mg Oral BID  . multivitamin  1 tablet Oral Daily  . multivitamin with minerals  1 tablet Oral Daily  . QUEtiapine  25 mg Oral QHS    BMET    Component Value Date/Time   NA 131 (L) 11/02/2017 0556   K 4.6 11/02/2017 0556   CL 104 11/02/2017 0556   CO2 14 (L) 11/02/2017 0556   GLUCOSE 172 (H) 11/02/2017 0556   BUN 89 (H) 11/02/2017 0556   CREATININE 6.17 (H) 11/02/2017 0556   CREATININE 0.85 08/14/2012 0930   CALCIUM 7.5 (L) 11/02/2017 0556   GFRNONAA 6 (L) 11/02/2017 0556   GFRNONAA 66 08/14/2012 0930   GFRAA 6 (L) 11/02/2017 0556   GFRAA 76 08/14/2012 0930   CBC    Component Value Date/Time   WBC 7.7 11/01/2017 0904   RBC 3.93 11/01/2017 0904  HGB 7.9 (L) 11/01/2017 0904   HGB 9.1 (L) 10/26/2009 1512   HCT 26.9 (L) 11/01/2017 0904   HCT 28.0 (L) 10/26/2009 1512   PLT 241 11/01/2017 0904   PLT 222 10/26/2009 1512   MCV 68.4 (L) 11/01/2017 0904   MCV 66.3 (L) 10/26/2009 1512   MCH 20.1 (L) 11/01/2017 0904   MCHC 29.4 (L) 11/01/2017 0904   RDW 16.9 (H) 11/01/2017 0904   RDW 17.9 (H) 10/26/2009 1512   LYMPHSABS 1.2 10/30/2017 0815   LYMPHSABS 1.5 10/26/2009 1512   MONOABS 1.0 10/30/2017 0815   MONOABS 0.4 10/26/2009 1512   EOSABS 0.0 10/30/2017 0815   EOSABS 0.1 10/26/2009 1512   BASOSABS 0.0 10/30/2017 0815   BASOSABS 0.0 10/26/2009 1512     Assessment/Plan:  1. AKI in setting of volume depletion- presumably ischemic ATN.  Unfortunately her volume status precludes the use of isotonic bicarb, hopefully will start to see an increase in UOP.  Rate of rise in Cr has slowed, however if continues to worsen may need to transfer to [facility] if family wishes to be aggressive (but is poor longterm candidate).  Continue to  follow I's/O's and renal function closely. 2. Colitis- with diarrhea on ceftriaxone and flagyl 3. Metabolic acidosis due to #1 4. Microcytic anemia 5. DM type 2 6. HTN- off meds due to hypotension and volume depletion.  Donetta Potts, MD [employer] [phone]

## 2017-11-02 NOTE — Progress Notes (Addendum)
[facility] PROGRESS NOTE    Progress Note  Lauren Crosby  ZOX:[id number] DOB: 12-16-1931 DOA: 10/30/2017 PCP: [name], MD     Brief Narrative:   Lauren Crosby is an 84 y.o. female past medical history significant for diabetes mellitus, chronic kidney disease stage III, hypertension morbid obesity chronic lower extremity swelling presents with chief complaints of diarrhea and weakness, CT scan of the abdomen and pelvis revealed wall thickening of the right moist sigmoid with mild pericolonic infiltrate changes  Assessment/Plan:   ATN: With a baseline creatinine 1-1.2.   We have consulted renal, her creatinine continues to rise, her creatinine today 6.1.  I appreciate nephrology's assistance. He is oliguric. He still complains she is tired. Physical therapy evaluation ordered.  Acute colitis: CT scan of the abdomen and pelvis done on 10/30/2017 showed diffuse colonic diverticulosis with some wall thickening in the rectum and sigmoid with mild pericolic infiltrates She is currently on IV Rocephin and Flagyl, her bowel movements have improved.  Anemia of chronic disease: Stable.  Diabetes mellitus type 2: She was started on long-acting insulin plus sliding scale.  Blood glucose continues to improve.  Chronic diastolic heart failure: Lasix have been held due to above she does have some chronic lower extremity swelling.  Tachypnea: Satting well on room air has some scant expiratory wheezing continue DuoNeb's, repeated chest x-ray shows some slight pulmonary edema.  Essential HTN (hypertension) Hold antihypertensive medication  Acute confusional state: Start Seroquel and Haldol IV as she was significantly agitated overnight.  Check a 12-lead EKG. I have called her husband and explain the situation and her mentation.   DVT prophylaxis: lovenox Family Communication: Father and Call daughter [phone] Disposition Plan/Barrier to D/C: Unable to  determine. Code Status:     Code Status Orders  (From admission, onward)         Start     Ordered   10/30/17 1416  Full code  Continuous     10/30/17 1415        Code Status History    Date Active Date Inactive Code Status Order ID Comments User Context   05/22/2017 2250 06/01/2017 1900 Full Code [id number]  Etta Quill, DO ED   01/13/2017 0250 01/15/2017 1622 Full Code [id number]  Jani Gravel, MD ED   08/11/2013 1955 08/13/2013 1559 Full Code [id number]  Babish, Lucille Passy, PA-C Inpatient        IV Access:    Peripheral IV   Procedures and diagnostic studies:   Dg Chest Port 1 View  Result Date: 10/31/2017 CLINICAL DATA:  84 year old female with shortness of breath. EXAM: PORTABLE CHEST 1 VIEW COMPARISON:  Portable chest 10/30/2017 and earlier. CT Abdomen and Pelvis 10/30/2017. FINDINGS: There did not appear to be overt pulmonary edema at the lung bases yesterday by CT. Lower lobe bronchiectasis and peribronchial thickening was noted. Continued low lung volumes stable appearance of the pulmonary interstitium today from May this year. Stable mild cardiomegaly. Visualized tracheal air column is within normal limits. No pneumothorax, pleural effusion or confluent opacity. Large body habitus. No acute osseous abnormality identified. IMPRESSION: Persistent low lung volumes. No focal pulmonary abnormality. Consider viral or atypical respiratory infection given the appearance of the lung bases on CT Abdomen and Pelvis yesterday. Electronically Signed   By: Genevie Ann M.D.   On: 10/31/2017 13:18     Medical Consultants:    None.  Anti-Infectives:   IV Rocephin and Flagyl  Subjective:    Lauren Crosby  she only had one bowel movement yesterday, she is agitated and not able to carry on a conversation completely confused and screaming, she did not have a good night sleep.  Objective:    Vitals:   11/01/17 0800 11/01/17 1321 11/01/17 1400 11/01/17 2130  BP: (!) 120/56 (!)  133/52 (!) 118/52 112/69  Pulse:  72  68  Resp:  (!) 24 20 18   Temp: 98 F (36.7 C) 98.2 F (36.8 C)  98.2 F (36.8 C)  TempSrc: Oral Oral  Oral  SpO2:  97%  95%  Weight:      Height:        Intake/Output Summary (Last 24 hours) at 11/02/2017 0840 Last data filed at 11/02/2017 0718 Gross per 24 hour  Intake 1576.43 ml  Output 250 ml  Net 1326.43 ml   Filed Weights   10/30/17 0729  Weight: 115.7 kg    Exam: General exam: In no acute distress. Respiratory system: Good air movement and clear to auscultation. Cardiovascular system: S1 & S2 heard, RRR.  Gastrointestinal system: Abdomen is nondistended, soft and nontender.  Central nervous system: Completely confused not able to answer questions moving all 4 extremities. Extremities: 2+ woody edema with chronic venous stasis changes Skin: No rashes, lesions or ulcers    Data Reviewed:    Labs: Basic Metabolic Panel: Recent Labs  Lab 10/30/17 0815 10/31/17 0631 11/01/17 0904 11/02/17 0556  NA 131* 130* 133* 131*  K 4.5 4.4 4.5 4.6  CL 101 102 104 104  CO2 18* 16* 16* 14*  GLUCOSE 148* 148* 266* 172*  BUN 81* 75* 81* 89*  CREATININE 3.96* 4.28* 5.57* 6.17*  CALCIUM 8.2* 7.4* 7.5* 7.5*  PHOS  --   --  7.4*  --    GFR Estimated Creatinine Clearance: 8.2 mL/min (A) (by C-G formula based on SCr of 6.17 mg/dL (H)). Liver Function Tests: Recent Labs  Lab 10/30/17 0815 10/31/17 0631  AST 28 20  ALT 24 21  ALKPHOS 151* 132*  BILITOT 0.5 0.7  PROT 7.4 6.2*  ALBUMIN 2.9* 2.5*   Recent Labs  Lab 10/30/17 0815  LIPASE 23   No results for input(s): AMMONIA in the last 168 hours. Coagulation profile No results for input(s): INR, PROTIME in the last 168 hours.  CBC: Recent Labs  Lab 10/30/17 0815 10/31/17 0631 11/01/17 0904  WBC 10.5 7.9 7.7  NEUTROABS 8.3*  --   --   HGB 8.2* 7.4* 7.9*  HCT 27.2* 25.0* 26.9*  MCV 67.3* 67.8* 68.4*  PLT 264 247 241   Cardiac Enzymes: Recent Labs  Lab  11/01/17 0904  CKTOTAL 247*   BNP (last 3 results) No results for input(s): PROBNP in the last 8760 hours. CBG: Recent Labs  Lab 11/01/17 0733 11/01/17 1216 11/01/17 1701 11/01/17 2133 11/02/17 0736  GLUCAP 218* 247* 225* 209* 163*   D-Dimer: No results for input(s): DDIMER in the last 72 hours. Hgb A1c: No results for input(s): HGBA1C in the last 72 hours. Lipid Profile: No results for input(s): CHOL, HDL, LDLCALC, TRIG, CHOLHDL, LDLDIRECT in the last 72 hours. Thyroid function studies: No results for input(s): TSH, T4TOTAL, T3FREE, THYROIDAB in the last 72 hours.  Invalid input(s): FREET3 Anemia work up: No results for input(s): VITAMINB12, FOLATE, FERRITIN, TIBC, IRON, RETICCTPCT in the last 72 hours. Sepsis Labs: Recent Labs  Lab 10/30/17 0815 10/31/17 0631 11/01/17 0904  WBC 10.5 7.9 7.7   Microbiology Recent Results (from the past 240 hour(s))  Gastrointestinal Panel  by PCR , Stool     Status: None   Collection Time: 10/30/17  9:59 AM  Result Value Ref Range Status   Campylobacter species NOT DETECTED NOT DETECTED Final   Plesimonas shigelloides NOT DETECTED NOT DETECTED Final   Salmonella species NOT DETECTED NOT DETECTED Final   Yersinia enterocolitica NOT DETECTED NOT DETECTED Final   Vibrio species NOT DETECTED NOT DETECTED Final   Vibrio cholerae NOT DETECTED NOT DETECTED Final   Enteroaggregative E coli (EAEC) NOT DETECTED NOT DETECTED Final   Enteropathogenic E coli (EPEC) NOT DETECTED NOT DETECTED Final   Enterotoxigenic E coli (ETEC) NOT DETECTED NOT DETECTED Final   Shiga like toxin producing E coli (STEC) NOT DETECTED NOT DETECTED Final   Shigella/Enteroinvasive E coli (EIEC) NOT DETECTED NOT DETECTED Final   Cryptosporidium NOT DETECTED NOT DETECTED Final   Cyclospora cayetanensis NOT DETECTED NOT DETECTED Final   Entamoeba histolytica NOT DETECTED NOT DETECTED Final   Giardia lamblia NOT DETECTED NOT DETECTED Final   Adenovirus F40/41 NOT  DETECTED NOT DETECTED Final   Astrovirus NOT DETECTED NOT DETECTED Final   Norovirus GI/GII NOT DETECTED NOT DETECTED Final   Rotavirus A NOT DETECTED NOT DETECTED Final   Sapovirus (I, II, IV, and V) NOT DETECTED NOT DETECTED Final    Comment: Performed at [facility], [address] [place], [place] [address]  C difficile quick scan w PCR reflex     Status: Abnormal   Collection Time: 10/30/17  9:59 AM  Result Value Ref Range Status   C Diff antigen POSITIVE (A) NEGATIVE Final   C Diff toxin NEGATIVE NEGATIVE Final   C Diff interpretation Results are indeterminate. See PCR results.  Final    Comment: Performed at [facility], [place] [address], [place], [place] [address]  C. Diff by PCR, Reflexed     Status: Abnormal   Collection Time: 10/30/17  9:59 AM  Result Value Ref Range Status   Toxigenic C. Difficile by PCR POSITIVE (A) NEGATIVE Final    Comment: Positive for toxigenic C. difficile with little to no toxin production. Only treat if clinical presentation suggests symptomatic illness. Performed at [facility], [place] [address], [place], [place] [address]      Medications:   . atorvastatin  40 mg Oral q1800  . insulin aspart  0-15 Units Subcutaneous TID WC  . insulin aspart  0-5 Units Subcutaneous QHS  . insulin glargine  25 Units Subcutaneous QHS  . metoprolol tartrate  25 mg Oral BID  . multivitamin  1 tablet Oral Daily  . multivitamin with minerals  1 tablet Oral Daily  . QUEtiapine  25 mg Oral QHS   Continuous Infusions: . sodium chloride Stopped (10/30/17 1737)  . cefTRIAXone (ROCEPHIN)  IV Stopped (11/01/17 1150)  . metronidazole Stopped (11/02/17 8264)      LOS: 2 days   Lauren Crosby  [facility] Pager [phone]  *Please refer to [email or web address], password [email or web address] to get updated schedule on who will round on this patient, as hospitalists switch teams weekly. If 7PM-7AM, please contact night-coverage at [email or web address],  password [email or web address] for any overnight needs.  11/02/2017, 8:40 AM

## 2017-11-03 ENCOUNTER — Inpatient Hospital Stay (HOSPITAL_COMMUNITY): Payer: Medicare HMO

## 2017-11-03 ENCOUNTER — Encounter (HOSPITAL_COMMUNITY): Payer: Self-pay | Admitting: Interventional Radiology

## 2017-11-03 DIAGNOSIS — R579 Shock, unspecified: Secondary | ICD-10-CM | POA: Diagnosis not present

## 2017-11-03 DIAGNOSIS — N179 Acute kidney failure, unspecified: Secondary | ICD-10-CM

## 2017-11-03 DIAGNOSIS — N183 Chronic kidney disease, stage 3 (moderate): Secondary | ICD-10-CM

## 2017-11-03 HISTORY — PX: IR US GUIDE VASC ACCESS RIGHT: IMG2390

## 2017-11-03 HISTORY — PX: IR FLUORO GUIDE CV LINE RIGHT: IMG2283

## 2017-11-03 LAB — BASIC METABOLIC PANEL
ANION GAP: 14 (ref 5–15)
BUN: 87 mg/dL — ABNORMAL HIGH (ref 8–23)
CO2: 11 mmol/L — ABNORMAL LOW (ref 22–32)
Calcium: 7.3 mg/dL — ABNORMAL LOW (ref 8.9–10.3)
Chloride: 104 mmol/L (ref 98–111)
Creatinine, Ser: 7.01 mg/dL — ABNORMAL HIGH (ref 0.44–1.00)
GFR calc Af Amer: 6 mL/min — ABNORMAL LOW (ref >60)
GFR, EST NON AFRICAN AMERICAN: 5 mL/min — AB (ref >60)
GLUCOSE: 199 mg/dL — AB (ref 70–99)
POTASSIUM: 5.1 mmol/L (ref 3.5–5.1)
SODIUM: 129 mmol/L — AB (ref 135–145)

## 2017-11-03 LAB — GLUCOSE, CAPILLARY
GLUCOSE-CAPILLARY: 180 mg/dL — AB (ref 70–99)
Glucose-Capillary: 196 mg/dL — ABNORMAL HIGH (ref 70–99)
Glucose-Capillary: 180 mg/dL — ABNORMAL HIGH (ref 70–99)
Glucose-Capillary: 129 mg/dL — ABNORMAL HIGH (ref 70–99)

## 2017-11-03 LAB — RENAL FUNCTION PANEL
Albumin: 2.6 g/dL — ABNORMAL LOW (ref 3.5–5.0)
Anion gap: 14 (ref 5–15)
BUN: 85 mg/dL — ABNORMAL HIGH (ref 8–23)
CO2: 13 mmol/L — AB (ref 22–32)
CREATININE: 7.52 mg/dL — AB (ref 0.44–1.00)
Calcium: 7.4 mg/dL — ABNORMAL LOW (ref 8.9–10.3)
Chloride: 104 mmol/L (ref 98–111)
GFR calc non Af Amer: 4 mL/min — ABNORMAL LOW (ref >60)
GFR, EST AFRICAN AMERICAN: 5 mL/min — AB (ref >60)
Glucose, Bld: 167 mg/dL — ABNORMAL HIGH (ref 70–99)
Phosphorus: 8.6 mg/dL — ABNORMAL HIGH (ref 2.5–4.6)
Potassium: 4.6 mmol/L (ref 3.5–5.1)
Sodium: 131 mmol/L — ABNORMAL LOW (ref 135–145)

## 2017-11-03 LAB — MRSA PCR SCREENING: MRSA BY PCR: NEGATIVE

## 2017-11-03 MED ORDER — STERILE WATER FOR INJECTION IV SOLN
INTRAVENOUS | Status: DC
  Administered 2017-11-03 – 2017-11-04 (×6): via INTRAVENOUS_CENTRAL
  Filled 2017-11-03 (×9): qty 150

## 2017-11-03 MED ORDER — FENTANYL CITRATE (PF) 100 MCG/2ML IJ SOLN
25.0000 ug | INTRAMUSCULAR | Status: DC | PRN
  Administered 2017-11-03 (×4): 25 ug via INTRAVENOUS
  Filled 2017-11-03 (×3): qty 2

## 2017-11-03 MED ORDER — FUROSEMIDE 10 MG/ML IJ SOLN
80.0000 mg | Freq: Two times a day (BID) | INTRAMUSCULAR | Status: DC
  Administered 2017-11-03 (×2): 80 mg via INTRAVENOUS
  Filled 2017-11-03 (×3): qty 8

## 2017-11-03 MED ORDER — FENTANYL CITRATE (PF) 100 MCG/2ML IJ SOLN
50.0000 ug | INTRAMUSCULAR | Status: DC | PRN
  Administered 2017-11-03 – 2017-11-05 (×16): 100 ug via INTRAVENOUS
  Filled 2017-11-03 (×17): qty 2

## 2017-11-03 MED ORDER — HEPARIN SODIUM (PORCINE) 1000 UNIT/ML DIALYSIS
1000.0000 [IU] | INTRAMUSCULAR | Status: DC | PRN
  Administered 2017-11-06 (×2): 1000 [IU] via INTRAVENOUS_CENTRAL
  Administered 2017-11-10 (×2): 1400 [IU] via INTRAVENOUS_CENTRAL
  Filled 2017-11-03 (×3): qty 6
  Filled 2017-11-03: qty 1
  Filled 2017-11-03: qty 6
  Filled 2017-11-03 (×2): qty 1
  Filled 2017-11-03 (×3): qty 6

## 2017-11-03 MED ORDER — HEPARIN (PORCINE) 2000 UNITS/L FOR CRRT
INTRAVENOUS_CENTRAL | Status: DC | PRN
  Administered 2017-11-06 – 2017-11-10 (×4): via INTRAVENOUS_CENTRAL
  Filled 2017-11-03: qty 1000

## 2017-11-03 MED ORDER — PRISMASOL BGK 4/2.5 32-4-2.5 MEQ/L IV SOLN
INTRAVENOUS | Status: DC
  Administered 2017-11-03 – 2017-11-10 (×43): via INTRAVENOUS_CENTRAL
  Filled 2017-11-03 (×61): qty 5000

## 2017-11-03 MED ORDER — FENTANYL CITRATE (PF) 100 MCG/2ML IJ SOLN
INTRAMUSCULAR | Status: AC
  Filled 2017-11-03: qty 2

## 2017-11-03 MED ORDER — LIDOCAINE-EPINEPHRINE (PF) 2 %-1:200000 IJ SOLN
INTRAMUSCULAR | Status: AC
  Administered 2017-11-03: 5 mL
  Filled 2017-11-03: qty 20

## 2017-11-03 MED ORDER — ORAL CARE MOUTH RINSE
15.0000 mL | Freq: Two times a day (BID) | OROMUCOSAL | Status: DC
  Administered 2017-11-03 – 2017-11-04 (×3): 15 mL via OROMUCOSAL

## 2017-11-03 MED ORDER — HALOPERIDOL LACTATE 5 MG/ML IJ SOLN
2.0000 mg | INTRAMUSCULAR | Status: DC | PRN
  Administered 2017-11-03 – 2017-11-06 (×13): 2 mg via INTRAVENOUS
  Filled 2017-11-03 (×14): qty 1

## 2017-11-03 MED ORDER — SODIUM BICARBONATE 8.4 % IV SOLN
50.0000 meq | Freq: Once | INTRAVENOUS | Status: AC
  Administered 2017-11-03: 50 meq via INTRAVENOUS
  Filled 2017-11-03: qty 50

## 2017-11-03 MED ORDER — STERILE WATER FOR INJECTION IV SOLN
INTRAVENOUS | Status: DC
  Administered 2017-11-03 – 2017-11-04 (×7): via INTRAVENOUS_CENTRAL
  Filled 2017-11-03 (×15): qty 150

## 2017-11-03 MED ORDER — FAMOTIDINE IN NACL 20-0.9 MG/50ML-% IV SOLN
20.0000 mg | INTRAVENOUS | Status: DC
  Administered 2017-11-03 – 2017-11-10 (×8): 20 mg via INTRAVENOUS
  Filled 2017-11-03 (×8): qty 50

## 2017-11-03 MED ORDER — HEPARIN SODIUM (PORCINE) 1000 UNIT/ML IJ SOLN
INTRAMUSCULAR | Status: AC
  Administered 2017-11-03: 6 mL
  Filled 2017-11-03: qty 1

## 2017-11-03 MED ORDER — HALOPERIDOL LACTATE 5 MG/ML IJ SOLN
2.0000 mg | Freq: Once | INTRAMUSCULAR | Status: AC
  Administered 2017-11-03: 2 mg via INTRAVENOUS
  Filled 2017-11-03: qty 1

## 2017-11-03 NOTE — Progress Notes (Signed)
Report called to [facility] in ICU. Pt trasnferred to room 1232 in bed with tech and RN. Family notified and pt belongs went with her.

## 2017-11-03 NOTE — Progress Notes (Signed)
Patients husband request a medicated cream for patients legs.  RN updated Dr. Melvyn Novas on family request, unable to place order for cream due to patients renal function.  Order for Fentanyl and Haldol placed.  RN will continue to monitor.

## 2017-11-03 NOTE — Progress Notes (Signed)
eLink Physician-Brief Progress Note Patient Name: Lauren Crosby DOB: [birth year] MRN: [id number]   Date of Service  11/03/2017  HPI/Events of Note  Severe agitation/delirium  eICU Interventions  Will order: 1. Please give Haldol as ordered.  2. Will increase Fentanyl to 50-100 mcg IV Q 2 hours PRN pain. 3. Monitor QTc interval Q 6 hours. Notify MD if QTc interval > 500 milliseconds.      Intervention Category Major Interventions: Delirium, psychosis, severe agitation - evaluation and management  Ashna Dorough Eugene 11/03/2017, 8:12 PM

## 2017-11-03 NOTE — Procedures (Signed)
Pre-procedure Diagnosis: ESRD Post-procedure Diagnosis: Same  Successful placement of a non-tunneled HD catheter with tips terminating within the superior aspect of the right atrium.    Complications: None Immediate  EBL: Minimal   The catheter is ready for immediate use.   Jay Jennika Ringgold, MD Pager #: [phone]   

## 2017-11-03 NOTE — Progress Notes (Signed)
Pt's husband was bedside when I arrived. He was praying as wife was screaming w/pain. She repeatedly said her legs hurt. Husband wants a [name] to come to administer last rites. He said he didn't know if she was passing or not.   Pt's grandson joined [place] during the visit.  Upon husband's request, I had prayer w/him and his wife. I will check on locating a [name] and report back to family. Please page if assistance is needed prior to that time. [place], MDiv   11/03/17 1300  Clinical Encounter Type  Visited With Patient and family together

## 2017-11-03 NOTE — Progress Notes (Signed)
Late Entry- E link notified of severe agitation, combative behavior and delirious behavior from pt. Pt screaming, unable to follow commands, and not consolable. Fentanyl and Haldol given by dayshift RN that was ineffective. Methods to deescalate including darkened room, reorientation, repositioning, quiet/calm environment have been unsuccessful. Will monitor.

## 2017-11-03 NOTE — Progress Notes (Signed)
S: Pt agitated and confused this morning crying out in pain. O:BP (!) 122/50 (BP Location: Right Arm)   Pulse 69   Temp 98.4 F (36.9 C) (Oral)   Resp 15   Ht 5\' 2"  (1.575 m)   Wt 115.7 kg   SpO2 96%   BMI 46.64 kg/m   Intake/Output Summary (Last 24 hours) at 11/03/2017 0912 Last data filed at 11/02/2017 2241 Gross per 24 hour  Intake 560 ml  Output 100 ml  Net 460 ml   Intake/Output: I/O last 3 completed shifts: In: 1678.3 [P.O.:600; IV Piggyback:1078.3] Out: 350 [Urine:350]  Intake/Output this shift:  No intake/output data recorded. Weight change:  Gen: obese, ill-appearing WF CVS:no rub Resp:scattered rhonchi Abd: obese +BS, soft, diffusely tender Ext: 1+ edema, tender  Recent Labs  Lab 10/30/17 0815 10/31/17 0631 11/01/17 0904 11/02/17 0556 11/03/17 0526  NA 131* 130* 133* 131* 129*  K 4.5 4.4 4.5 4.6 5.1  CL 101 102 104 104 104  CO2 18* 16* 16* 14* 11*  GLUCOSE 148* 148* 266* 172* 199*  BUN 81* 75* 81* 89* 87*  CREATININE 3.96* 4.28* 5.57* 6.17* 7.01*  ALBUMIN 2.9* 2.5*  --   --   --   CALCIUM 8.2* 7.4* 7.5* 7.5* 7.3*  PHOS  --   --  7.4*  --   --   AST 28 20  --   --   --   ALT 24 21  --   --   --    Liver Function Tests: Recent Labs  Lab 10/30/17 0815 10/31/17 0631  AST 28 20  ALT 24 21  ALKPHOS 151* 132*  BILITOT 0.5 0.7  PROT 7.4 6.2*  ALBUMIN 2.9* 2.5*   Recent Labs  Lab 10/30/17 0815  LIPASE 23   No results for input(s): AMMONIA in the last 168 hours. CBC: Recent Labs  Lab 10/30/17 0815 10/31/17 0631 11/01/17 0904  WBC 10.5 7.9 7.7  NEUTROABS 8.3*  --   --   HGB 8.2* 7.4* 7.9*  HCT 27.2* 25.0* 26.9*  MCV 67.3* 67.8* 68.4*  PLT 264 247 241   Cardiac Enzymes: Recent Labs  Lab 11/01/17 0904  CKTOTAL 247*   CBG: Recent Labs  Lab 11/02/17 0736 11/02/17 1158 11/02/17 1550 11/02/17 2032 11/03/17 0852  GLUCAP 163* 257* 193* 229* 196*    Iron Studies: No results for input(s): IRON, TIBC, TRANSFERRIN, FERRITIN in the  last 72 hours. Studies/Results: No results found. Marland Kitchen atorvastatin  40 mg Oral q1800  . furosemide  80 mg Intravenous BID  . insulin aspart  0-15 Units Subcutaneous TID WC  . insulin aspart  0-5 Units Subcutaneous QHS  . insulin glargine  25 Units Subcutaneous QHS  . metoprolol tartrate  25 mg Oral BID  . multivitamin  1 tablet Oral Daily  . multivitamin with minerals  1 tablet Oral Daily  . QUEtiapine  25 mg Oral QHS    BMET    Component Value Date/Time   NA 129 (L) 11/03/2017 0526   K 5.1 11/03/2017 0526   CL 104 11/03/2017 0526   CO2 11 (L) 11/03/2017 0526   GLUCOSE 199 (H) 11/03/2017 0526   BUN 87 (H) 11/03/2017 0526   CREATININE 7.01 (H) 11/03/2017 0526   CREATININE 0.85 08/14/2012 0930   CALCIUM 7.3 (L) 11/03/2017 0526   GFRNONAA 5 (L) 11/03/2017 0526   GFRNONAA 66 08/14/2012 0930   GFRAA 6 (L) 11/03/2017 0526   GFRAA 76 08/14/2012 0930   CBC  Component Value Date/Time   WBC 7.7 11/01/2017 0904   RBC 3.93 11/01/2017 0904   HGB 7.9 (L) 11/01/2017 0904   HGB 9.1 (L) 10/26/2009 1512   HCT 26.9 (L) 11/01/2017 0904   HCT 28.0 (L) 10/26/2009 1512   PLT 241 11/01/2017 0904   PLT 222 10/26/2009 1512   MCV 68.4 (L) 11/01/2017 0904   MCV 66.3 (L) 10/26/2009 1512   MCH 20.1 (L) 11/01/2017 0904   MCHC 29.4 (L) 11/01/2017 0904   RDW 16.9 (H) 11/01/2017 0904   RDW 17.9 (H) 10/26/2009 1512   LYMPHSABS 1.2 10/30/2017 0815   LYMPHSABS 1.5 10/26/2009 1512   MONOABS 1.0 10/30/2017 0815   MONOABS 0.4 10/26/2009 1512   EOSABS 0.0 10/30/2017 0815   EOSABS 0.1 10/26/2009 1512   BASOSABS 0.0 10/30/2017 0815   BASOSABS 0.0 10/26/2009 1512    Assessment/Plan:  1. AKI in setting of volume depletion- presumably ischemic ATN.  Unfortunately her volume status precludes the use of isotonic bicarb, and she has clinically declined.  Now with worsening acidosis and worsening BUN/Cr and K.  Discussed with Dr. Olevia Bowens and will transfer pt [facility] ICU at [facility] for short term trial of CVVHD.   Will ask IR to place temp HD cath if PCCM cannot.  Need to have palliative care consult to help set goals/limits of care as she is a full code.  Rate of rise in Cr has slowed, however she remains oliguric/anuric  2. Colitis- with diarrhea on IV ceftriaxone and flagyl 3. Metabolic acidosis due to #1, as above 4. Microcytic anemia 5. DM type 2 6. HTN- off meds due to hypotension and volume depletion. Donetta Potts, MD [employer] [phone]

## 2017-11-03 NOTE — Progress Notes (Signed)
Patient blood pressure decreased down to 86/32 s/p administration of haldol and fentanyl.  MD updated, received no new orders at this time.

## 2017-11-03 NOTE — Consult Note (Signed)
PULMONARY / CRITICAL CARE MEDICINE   NAME:  Lauren Crosby, MRN:  [id number], DOB:  1931/03/31, LOS: 3 ADMISSION DATE:  10/30/2017, CONSULTATION DATE:  11/03/17 REFERRING MD:  [facility]/ renal  CHIEF COMPLAINT:  Acidosis/ renal failure/ delirium   BRIEF HISTORY:     86 yowf morbid obesity  Complicated byh DM/ CRI and hbp adm 10/30/17 with diarrhea and worsening renal insuff  With dx of colitis/ met acidosis and worsening mental status am 10/26 so transferred to ICU for consideration for CVVH and HD catheter placement and pccm service consulted.    HISTORY OF PRESENT ILLNESS   Pt in her usual state of healthy until  Around 10 days pta  With bad constipation then bad diarrhea p dulcolax and profuse diarrhea since assoc with crampy gen abd pain and gen progressive weakness.   W/u in hosp sign for non-specific colitis changes on CT and she has not had recent abx or travel exp  But stool pos C diff and she has been rx'd for this with IV flagyl    SIGNIFICANT PAST MEDICAL HISTORY      SIGNIFICANT EVENTS:    STUDIES:     CULTURES:     ANTIBIOTICS:  Vanc po 10/22 > 10/23 Flagyl 10/22> Rocephin 10/22  C diff 10/22 > positive    LINES/TUBES:    CONSULTANTS:  Renal  10/24   SUBJECTIVE:  Agitated not coherent, much better p haldol/ fentanyl IV      CONSTITUTIONAL: BP (!) 86/38   Pulse 67   Temp 97.7 F (36.5 C) (Axillary)   Resp 18   Ht 5' 2" (1.575 m)   Wt 124.1 kg   SpO2 91%   BMI 50.04 kg/m   I/O last 3 completed shifts: In: 1678.3 [P.O.:600; IV Piggyback:1078.3] Out: 350 [Urine:350]        PHYSICAL EXAM: General:  Chronically and acutely ill elderly obese wf  Neuro:  Agitated, moving all 4 but not to command consisteny  HEENT:  Oropharynx clear, neck supple Cardiovascular:  RRR no s3 Lungs:  Lungs clear bilaterally Abdomen:  Mod distended, decreased bs  Musculoskeletal:  No deformities/ ext cool Skin:  No obvious breakdown/ rash   RESOLVED PROBLEM  LIST   ASSESSMENT AND PLAN    1)  Possible low grade GI sepsis ? Related to [facility]  - consider adding po vanc can tolerate    2) Met acidosis with AG and nonAG components  c/w ARI/ sepsis  - rx hc03 drip by nephrology and CVVH planned    3) AMS c/w TME  - no focal deficits rec continue prn IV haldol with low doses of fentanyl      SUMMARY OF TODAY'S PLAN:  Place HD catheter per IR planned by [facility] then CVVH to correct acidosis   Best Practice / Goals of Care / Disposition.   DVT PROPHYLAXIS: PAS SUP: Pepcid IV NUTRITION: no for now MOBILITY:bedrest of now GOALS OF CARE: full code for now  FAMILY DISCUSSIONS: awarit to see if responds to CVVH DISPOSITION  Hopefully back to baseline   LABS  Glucose Recent Labs  Lab 11/02/17 0736 11/02/17 1158 11/02/17 1550 11/02/17 2032 11/03/17 0852 11/03/17 1349  GLUCAP 163* 257* 193* 229* 196* 180*    BMET Recent Labs  Lab 11/01/17 0904 11/02/17 0556 11/03/17 0526  NA 133* 131* 129*  K 4.5 4.6 5.1  CL 104 104 104  CO2 16* 14* 11*  BUN 81* 89* 87*  CREATININE 5.57* 6.17* 7.01*  GLUCOSE 266* 172* 199*    Liver Enzymes Recent Labs  Lab 10/30/17 0815 10/31/17 0631  AST 28 20  ALT 24 21  ALKPHOS 151* 132*  BILITOT 0.5 0.7  ALBUMIN 2.9* 2.5*    Electrolytes Recent Labs  Lab 11/01/17 0904 11/02/17 0556 11/03/17 0526  CALCIUM 7.5* 7.5* 7.3*  PHOS 7.4*  --   --     CBC Recent Labs  Lab 10/30/17 0815 10/31/17 0631 11/01/17 0904  WBC 10.5 7.9 7.7  HGB 8.2* 7.4* 7.9*  HCT 27.2* 25.0* 26.9*  PLT 264 247 241    ABG No results for input(s): PHART, PCO2ART, PO2ART in the last 168 hours.  Coag's No results for input(s): APTT, INR in the last 168 hours.  Sepsis Markers No results for input(s): LATICACIDVEN, PROCALCITON, O2SATVEN in the last 168 hours.  Cardiac Enzymes No results for input(s): TROPONINI, PROBNP in the last 168 hours.  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  PAST SURGICAL HISTORY:  She  has a past surgical history that includes Cholecystectomy; Sigmoid resection / rectopexy; Ventral hernia repair (2007); Total abdominal hysterectomy; Rotator cuff repair (Left); Cardiac catheterization (2008); Colonoscopy (10/04/06); DIAGNOSTIC MAMMOGRAM (03/17/2011); Tonsillectomy; Appendectomy; Total knee arthroplasty (Right, 08/11/2013); and Cystoscopy with biopsy (N/A, 06/16/2016).  Allergies  Allergen Reactions  . Penicillins Other (See Comments)    Has patient had a PCN reaction causing immediate rash, facial/tongue/throat swelling, SOB or lightheadedness with hypotension: No Has patient had a PCN reaction causing severe rash involving mucus membranes or skin necrosis: No Has patient had a PCN reaction that required hospitalization: No Has patient had a PCN reaction occurring within the last 10 years: Unknown If all of the above answers are "NO", then may proceed with Cephalosporin use.    No current facility-administered medications on file prior to encounter.    Current Outpatient Medications on File Prior to Encounter  Medication Sig  . allopurinol (ZYLOPRIM) 300 MG tablet Take 300 mg by mouth daily.  . furosemide (LASIX) 20 MG tablet Take 20 mg by mouth daily.   . insulin NPH-regular Human (NOVOLIN 70/30) (70-30) 100 UNIT/ML injection 45 units in the am and 50 units at supper  . metoprolol tartrate (LOPRESSOR) 25 MG tablet Take 1 tablet (25 mg total) by mouth 2 (two) times daily.  . Multiple  Vitamin (MULTIVITAMIN WITH MINERALS) TABS tablet Take 1 tablet by mouth daily.  . Multiple Vitamins-Minerals (PRESERVISION AREDS 2+MULTI VIT PO) Take 1 capsule by mouth 2 (two) times daily.   . simvastatin (ZOCOR) 80 MG tablet Take 80 mg by mouth at bedtime.   Marland Kitchen esomeprazole (NEXIUM) 40 MG capsule Take 40 mg by mouth daily.  Marland Kitchen guaiFENesin-dextromethorphan (ROBITUSSIN DM) 100-10 MG/5ML syrup Take 5 mLs by mouth every 4 (four) hours as needed for cough. (Patient not taking: Reported on 10/30/2017)    FAMILY HISTORY:   Her family history includes Diabetes in her father; Diverticulitis in her sister; Hypertension in her father, sister, and unknown relative; Kidney disease in her father; Liver disease in her mother; Rheum arthritis in her sister. There is no history of Colon cancer, Colon polyps,  Esophageal cancer, Pancreatic cancer, or Stomach cancer.  SOCIAL HISTORY:  She  reports that she has never smoked. She has never used smokeless tobacco. She reports that she does not drink alcohol or use drugs.         The patient is critically ill with multiple organ systems failure and requires high complexity decision making for assessment and support, frequent evaluation and titration of therapies, application of advanced monitoring technologies and extensive interpretation of multiple databases. Critical Care Time devoted to patient care services described in this note is 45 minutes.    Christinia Gully, MD [facility] [phone] After 5:30 PM or weekends, use [facility] [phone]

## 2017-11-03 NOTE — Progress Notes (Signed)
PT Cancellation Note  Patient Details Name: Lauren Crosby MRN: [id number] DOB: [birth year]   Cancelled Treatment:    Reason Eval/Treat Not Completed: Medical issues which prohibited therapyTransfer to ICU for HD. Will check back on 11/05/17   [name] 11/03/2017, 11:13 AM  [place] Pager [phone] Office [phone]

## 2017-11-03 NOTE — Progress Notes (Signed)
Pt has a tendency to moan & whine for no apparent reason, unable to determine the cause of her discomfort, responded well to the Haldol / Norco given earlier.

## 2017-11-03 NOTE — Progress Notes (Signed)
[facility] PROGRESS NOTE    Progress Note  Lauren Crosby  HMC:[id number] DOB: 1931/06/03 DOA: 10/30/2017 PCP: [name], MD     Brief Narrative:   Lauren Crosby is an 84 y.o. female past medical history significant for diabetes mellitus, chronic kidney disease stage III, hypertension morbid obesity chronic lower extremity swelling presents with chief complaints of diarrhea and weakness, CT scan of the abdomen and pelvis revealed wall thickening of the right moist sigmoid with mild pericolonic infiltrate changes  Assessment/Plan:   ATN: With a baseline creatinine 1-1.2.   We appreciate nephrology's assistance. Her creatinine continues to rise she remains oliguric. We have spoken to the family and they would like to proceed with temporary dialysis. We have consulted critical care and interventional radiologist to put a catheter. We will transfer her to the ICU pulmonary and critical care will take over.  Acute colitis: Currently on Rocephin and Flagyl, has not had a bowel movement today. Yesterday she did have it bowel movement which was soft. We will get an abdominal x-ray. She is currently on IV Rocephin and Flagyl. C. difficile PCR was positive but her toxin was negative.  Anemia of chronic disease: Stable.  Diabetes mellitus type 2: Continue long-acting insulin long-acting insulin plus sliding scale.    Chronic diastolic heart failure: Lasix have been held due to above she does have some chronic lower extremity swelling.  Tachypnea: No respiratory wheezingSatting 96% on room air, satting 96% on room air.  Essential HTN (hypertension) Hold antihypertensive medication  Acute confusional state: Start Seroquel and Haldol IV she continues to be confused.   DVT prophylaxis: lovenox Family Communication: Father and Call daughter [phone] Disposition Plan/Barrier to D/C: Unable to determine. Code Status:     Code Status Orders  (From  admission, onward)         Start     Ordered   10/30/17 1416  Full code  Continuous     10/30/17 1415        Code Status History    Date Active Date Inactive Code Status Order ID Comments User Context   05/22/2017 2250 06/01/2017 1900 Full Code [id number]  Etta Quill, DO ED   01/13/2017 0250 01/15/2017 1622 Full Code [id number]  Jani Gravel, MD ED   08/11/2013 1955 08/13/2013 1559 Full Code [id number]  Babish, Lucille Passy, PA-C Inpatient        IV Access:    Peripheral IV   Procedures and diagnostic studies:   No results found.   Medical Consultants:    None.  Anti-Infectives:   IV Rocephin and Flagyl  Subjective:    [name] she only had one bowel movement yesterday she has had none since then, she continues to be agitated   Objective:    Vitals:   11/01/17 2130 11/02/17 1933 11/03/17 0428 11/03/17 0504  BP: 112/69 (!) 115/40  (!) 122/50  Pulse: 68 73 72 69  Resp: 18 14  15   Temp: 98.2 F (36.8 C) 98.7 F (37.1 C) 98.6 F (37 C) 98.4 F (36.9 C)  TempSrc: Oral  Oral Oral  SpO2: 95% 99% 94% 96%  Weight:      Height:        Intake/Output Summary (Last 24 hours) at 11/03/2017 0856 Last data filed at 11/02/2017 2241 Gross per 24 hour  Intake 560 ml  Output 100 ml  Net 460 ml   Filed Weights   10/30/17 0729  Weight: 115.7 kg  Exam: General exam: In no acute distress. Respiratory system: Good air movement and clear to auscultation. Cardiovascular system: S1 & S2 heard, RRR.  Gastrointestinal system: Abdomen is nondistended, soft and nontender.  Central nervous system: Completely confused not able to answer questions moving all 4 extremities. Extremities: 2+ woody edema with chronic venous stasis changes Skin: No rashes, lesions or ulcers    Data Reviewed:    Labs: Basic Metabolic Panel: Recent Labs  Lab 10/30/17 0815 10/31/17 0631 11/01/17 0904 11/02/17 0556 11/03/17 0526  NA 131* 130* 133* 131* 129*  K 4.5 4.4 4.5  4.6 5.1  CL 101 102 104 104 104  CO2 18* 16* 16* 14* 11*  GLUCOSE 148* 148* 266* 172* 199*  BUN 81* 75* 81* 89* 87*  CREATININE 3.96* 4.28* 5.57* 6.17* 7.01*  CALCIUM 8.2* 7.4* 7.5* 7.5* 7.3*  PHOS  --   --  7.4*  --   --    GFR Estimated Creatinine Clearance: 7.2 mL/min (A) (by C-G formula based on SCr of 7.01 mg/dL (H)). Liver Function Tests: Recent Labs  Lab 10/30/17 0815 10/31/17 0631  AST 28 20  ALT 24 21  ALKPHOS 151* 132*  BILITOT 0.5 0.7  PROT 7.4 6.2*  ALBUMIN 2.9* 2.5*   Recent Labs  Lab 10/30/17 0815  LIPASE 23   No results for input(s): AMMONIA in the last 168 hours. Coagulation profile No results for input(s): INR, PROTIME in the last 168 hours.  CBC: Recent Labs  Lab 10/30/17 0815 10/31/17 0631 11/01/17 0904  WBC 10.5 7.9 7.7  NEUTROABS 8.3*  --   --   HGB 8.2* 7.4* 7.9*  HCT 27.2* 25.0* 26.9*  MCV 67.3* 67.8* 68.4*  PLT 264 247 241   Cardiac Enzymes: Recent Labs  Lab 11/01/17 0904  CKTOTAL 247*   BNP (last 3 results) No results for input(s): PROBNP in the last 8760 hours. CBG: Recent Labs  Lab 11/01/17 2133 11/02/17 0736 11/02/17 1158 11/02/17 1550 11/02/17 2032  GLUCAP 209* 163* 257* 193* 229*   D-Dimer: No results for input(s): DDIMER in the last 72 hours. Hgb A1c: No results for input(s): HGBA1C in the last 72 hours. Lipid Profile: No results for input(s): CHOL, HDL, LDLCALC, TRIG, CHOLHDL, LDLDIRECT in the last 72 hours. Thyroid function studies: No results for input(s): TSH, T4TOTAL, T3FREE, THYROIDAB in the last 72 hours.  Invalid input(s): FREET3 Anemia work up: No results for input(s): VITAMINB12, FOLATE, FERRITIN, TIBC, IRON, RETICCTPCT in the last 72 hours. Sepsis Labs: Recent Labs  Lab 10/30/17 0815 10/31/17 0631 11/01/17 0904  WBC 10.5 7.9 7.7   Microbiology Recent Results (from the past 240 hour(s))  Gastrointestinal Panel by PCR , Stool     Status: None   Collection Time: 10/30/17  9:59 AM  Result  Value Ref Range Status   Campylobacter species NOT DETECTED NOT DETECTED Final   Plesimonas shigelloides NOT DETECTED NOT DETECTED Final   Salmonella species NOT DETECTED NOT DETECTED Final   Yersinia enterocolitica NOT DETECTED NOT DETECTED Final   Vibrio species NOT DETECTED NOT DETECTED Final   Vibrio cholerae NOT DETECTED NOT DETECTED Final   Enteroaggregative E coli (EAEC) NOT DETECTED NOT DETECTED Final   Enteropathogenic E coli (EPEC) NOT DETECTED NOT DETECTED Final   Enterotoxigenic E coli (ETEC) NOT DETECTED NOT DETECTED Final   Shiga like toxin producing E coli (STEC) NOT DETECTED NOT DETECTED Final   Shigella/Enteroinvasive E coli (EIEC) NOT DETECTED NOT DETECTED Final   Cryptosporidium NOT DETECTED NOT  DETECTED Final   Cyclospora cayetanensis NOT DETECTED NOT DETECTED Final   Entamoeba histolytica NOT DETECTED NOT DETECTED Final   Giardia lamblia NOT DETECTED NOT DETECTED Final   Adenovirus F40/41 NOT DETECTED NOT DETECTED Final   Astrovirus NOT DETECTED NOT DETECTED Final   Norovirus GI/GII NOT DETECTED NOT DETECTED Final   Rotavirus A NOT DETECTED NOT DETECTED Final   Sapovirus (I, II, IV, and V) NOT DETECTED NOT DETECTED Final    Comment: Performed at [facility], [address] [place], [place] [address]  C difficile quick scan w PCR reflex     Status: Abnormal   Collection Time: 10/30/17  9:59 AM  Result Value Ref Range Status   C Diff antigen POSITIVE (A) NEGATIVE Final   C Diff toxin NEGATIVE NEGATIVE Final   C Diff interpretation Results are indeterminate. See PCR results.  Final    Comment: Performed at [facility], [place] [address], [place], [place] [address]  C. Diff by PCR, Reflexed     Status: Abnormal   Collection Time: 10/30/17  9:59 AM  Result Value Ref Range Status   Toxigenic C. Difficile by PCR POSITIVE (A) NEGATIVE Final    Comment: Positive for toxigenic C. difficile with little to no toxin production. Only treat if  clinical presentation suggests symptomatic illness. Performed at [facility], [place] [address] [place], [place] [address]   Urine Culture     Status: None (Preliminary result)   Collection Time: 11/03/17  1:07 AM  Result Value Ref Range Status   Specimen Description   Final    URINE, CATHETERIZED Performed at [facility] [address], [place], [place] [address]    Special Requests   Final    NONE Performed at [facility], [place] [address], [place], [place] [address]    Culture PENDING  Incomplete   Report Status PENDING  Incomplete     Medications:   . atorvastatin  40 mg Oral q1800  . furosemide  80 mg Intravenous BID  . insulin aspart  0-15 Units Subcutaneous TID WC  . insulin aspart  0-5 Units Subcutaneous QHS  . insulin glargine  25 Units Subcutaneous QHS  . metoprolol tartrate  25 mg Oral BID  . multivitamin  1 tablet Oral Daily  . multivitamin with minerals  1 tablet Oral Daily  . QUEtiapine  25 mg Oral QHS   Continuous Infusions: . sodium chloride Stopped (10/30/17 1737)  . cefTRIAXone (ROCEPHIN)  IV Stopped (11/02/17 1231)  . metronidazole 500 mg (11/03/17 0553)      LOS: 3 days   [place] [facility] Pager [phone]  *Please refer to [email or web address], password [email or web address] to get updated schedule on who will round on this patient, as hospitalists switch teams weekly. If 7PM-7AM, please contact night-coverage at [email or web address], password [email or web address] for any overnight needs.  11/03/2017, 8:56 AM

## 2017-11-03 NOTE — Progress Notes (Signed)
I spoke with [facility] priest who has agreed to come and administer last rites. Please page if additional assistance is needed or if I need to make another call. [place], [place]   11/03/17 1356  Clinical Encounter Type  Visited With Patient and family together

## 2017-11-03 NOTE — Progress Notes (Signed)
Patient screaming out into the hall, RN unable to console or calm patient.  Dr. Aileen Fass updated RN received a one time order for Haldol 2mg  to be given now.  RN will continue to monitor.

## 2017-11-04 DIAGNOSIS — Z794 Long term (current) use of insulin: Secondary | ICD-10-CM

## 2017-11-04 DIAGNOSIS — G9340 Encephalopathy, unspecified: Secondary | ICD-10-CM

## 2017-11-04 DIAGNOSIS — E1165 Type 2 diabetes mellitus with hyperglycemia: Secondary | ICD-10-CM

## 2017-11-04 LAB — RENAL FUNCTION PANEL
ALBUMIN: 2.6 g/dL — AB (ref 3.5–5.0)
Anion gap: 14 (ref 5–15)
BUN: 62 mg/dL — ABNORMAL HIGH (ref 8–23)
CHLORIDE: 90 mmol/L — AB (ref 98–111)
CO2: 31 mmol/L (ref 22–32)
CREATININE: 4.56 mg/dL — AB (ref 0.44–1.00)
Calcium: 6.8 mg/dL — ABNORMAL LOW (ref 8.9–10.3)
GFR, EST AFRICAN AMERICAN: 9 mL/min — AB (ref >60)
GFR, EST NON AFRICAN AMERICAN: 8 mL/min — AB (ref >60)
Glucose, Bld: 103 mg/dL — ABNORMAL HIGH (ref 70–99)
POTASSIUM: 3.5 mmol/L (ref 3.5–5.1)
Phosphorus: 5.6 mg/dL — ABNORMAL HIGH (ref 2.5–4.6)
Sodium: 135 mmol/L (ref 135–145)

## 2017-11-04 LAB — GLUCOSE, CAPILLARY
GLUCOSE-CAPILLARY: 84 mg/dL (ref 70–99)
GLUCOSE-CAPILLARY: 83 mg/dL (ref 70–99)
Glucose-Capillary: 90 mg/dL (ref 70–99)
Glucose-Capillary: 92 mg/dL (ref 70–99)

## 2017-11-04 LAB — MAGNESIUM: MAGNESIUM: 2.4 mg/dL (ref 1.7–2.4)

## 2017-11-04 LAB — URINE CULTURE: Culture: NO GROWTH

## 2017-11-04 MED ORDER — PRISMASOL BGK 4/2.5 32-4-2.5 MEQ/L REPLACEMENT SOLN
Status: DC
  Administered 2017-11-04 – 2017-11-10 (×7): via INTRAVENOUS_CENTRAL
  Filled 2017-11-04 (×11): qty 5000

## 2017-11-04 MED ORDER — SODIUM CHLORIDE 0.9% FLUSH
10.0000 mL | INTRAVENOUS | Status: DC | PRN
  Administered 2017-11-10: 10 mL
  Filled 2017-11-04: qty 40

## 2017-11-04 MED ORDER — SODIUM CHLORIDE 0.9% FLUSH
10.0000 mL | Freq: Two times a day (BID) | INTRAVENOUS | Status: DC
  Administered 2017-11-04 – 2017-11-09 (×7): 10 mL
  Administered 2017-11-10: 40 mL
  Administered 2017-11-10 – 2017-11-13 (×5): 10 mL

## 2017-11-04 MED ORDER — PRISMASOL BGK 4/2.5 32-4-2.5 MEQ/L REPLACEMENT SOLN
Status: DC
  Administered 2017-11-04 – 2017-11-09 (×13): via INTRAVENOUS_CENTRAL
  Filled 2017-11-04 (×17): qty 5000

## 2017-11-04 MED ORDER — CHLORHEXIDINE GLUCONATE CLOTH 2 % EX PADS
6.0000 | MEDICATED_PAD | Freq: Every day | CUTANEOUS | Status: DC
  Administered 2017-11-04 – 2017-11-15 (×11): 6 via TOPICAL

## 2017-11-04 MED ORDER — ORAL CARE MOUTH RINSE
15.0000 mL | Freq: Two times a day (BID) | OROMUCOSAL | Status: DC
  Administered 2017-11-04 – 2017-11-05 (×3): 15 mL via OROMUCOSAL

## 2017-11-04 NOTE — Progress Notes (Addendum)
PULMONARY / CRITICAL CARE MEDICINE   NAME:  Lauren Crosby, MRN:  [id number], DOB:  17-Sep-1931, LOS: 4 ADMISSION DATE:  10/30/2017, CONSULTATION DATE:  11/03/17 REFERRING MD:  [facility]/ renal  CHIEF COMPLAINT:  Acidosis/ renal failure/ delirium   BRIEF HISTORY:     86 yowf morbid obesity  Complicated byh DM/ CRI and hbp adm 10/30/17 with diarrhea and worsening renal insuff  With dx of prob c diff colitis/ met acidosis and worsening mental status am 10/26 so transferred to ICU for   HD catheter placement / initiation of cvvh and pccm service consulted.      SIGNIFICANT EVENTS:  started CVVH  Pm 10/26  STUDIES:     CULTURES:    ciff 10/22 pos pcr/ pos antigen neg for toxin UC 10/26 >>>   ANTIBIOTICS:  Vanc po 10/22 > 10/23 Flagyl 10/22> Rocephin 10/22     LINES/TUBES:   HD catheter per IR  R SCVein  10/26   CONSULTANTS:  Renal  10/24   SUBJECTIVE:  Agitated not coherent, responded to increase doses of fent/haldol per elink Resting quietly this am   CONSTITUTIONAL: BP (!) 140/35   Pulse 80   Temp 97.9 F (36.6 C) (Axillary)   Resp 19   Ht 5' 2"  (1.575 m)   Wt 120 kg   SpO2 97%   BMI 48.39 kg/m   On 2lpm NP  I/O last 3 completed shifts: In: 735.9 [P.O.:120; I.V.:20.6; Other:49; IV Piggyback:546.4] Out: 892 [Urine:275; Other:617]       PHYSICAL EXAM: Pt sedated/nad @ 45 degrees hob No jvd Oropharynx clear,  mucosa nl Neck supple Lungs with a few scattered exp > insp rhonchi bilaterally RRR no s3 or or sign murmur Abd obese/mod distended, no guarding or obvious tenderness  with very limited excursion  Extr warm with no edema or clubbing noted Neuro sedated no apparent motor deficits   RESOLVED PROBLEM LIST   ASSESSMENT AND PLAN    1)  Possible low grade GI sepsis ? Related to [facility]  - consider adding po vanc per tube if placed    2) Met acidosis with AG and nonAG components  c/w ARI/ sepsis  -  cvvh started pm 10/26  > rx per renal    3) AMS c/w  TME  - no focal deficits rec continue prn IV haldol with low doses of fentanyl  Seems to work the best   4) DM Sugars down this am after lantus 10/26 pm > d/c lantus and just use SSI  May need d10 infusion if trend continues    SUMMARY OF TODAY'S PLAN:  Continue cvvh per renal    Best Practice / Goals of Care / Disposition.   DVT PROPHYLAXIS: PAS SUP: Pepcid IV NUTRITION: none  for now MOBILITY:bedrest for now GOALS OF CARE: full code for now  FAMILY DISCUSSIONS: awarit to see if responds to CVVH DISPOSITION  Hopefully back to baseline / husband very attentive at bedside but hard of hearing and not sure he always gets the picture   LABS  Glucose Recent Labs  Lab 11/02/17 2032 11/03/17 0852 11/03/17 1349 11/03/17 1608 11/03/17 2154 11/04/17 0815  GLUCAP 229* 196* 180* 180* 129* 84    BMET Recent Labs  Lab 11/03/17 0526 11/03/17 1559 11/04/17 0458  NA 129* 131* 135  K 5.1 4.6 3.5  CL 104 104 90*  CO2 11* 13* 31  BUN 87* 85* 62*  CREATININE 7.01* 7.52* 4.56*  GLUCOSE 199* 167* PENDING  Liver Enzymes Recent Labs  Lab 10/30/17 0815 10/31/17 0631 11/03/17 1559 11/04/17 0458  AST 28 20  --   --   ALT 24 21  --   --   ALKPHOS 151* 132*  --   --   BILITOT 0.5 0.7  --   --   ALBUMIN 2.9* 2.5* 2.6* 2.6*    Electrolytes Recent Labs  Lab 11/01/17 0904  11/03/17 0526 11/03/17 1559 11/04/17 0458  CALCIUM 7.5*   < > 7.3* 7.4* 6.8*  MG  --   --   --   --  2.4  PHOS 7.4*  --   --  8.6* 5.6*   < > = values in this interval not displayed.    CBC Recent Labs  Lab 10/30/17 0815 10/31/17 0631 11/01/17 0904  WBC 10.5 7.9 7.7  HGB 8.2* 7.4* 7.9*  HCT 27.2* 25.0* 26.9*  PLT 264 247 241    ABG No results for input(s): PHART, PCO2ART, PO2ART in the last 168 hours.  Coag's No results for input(s): APTT, INR in the last 168 hours.  Sepsis Markers No results for input(s): LATICACIDVEN, PROCALCITON, O2SATVEN in the last 168 hours.  Cardiac  Enzymes No results for input(s): TROPONINI, PROBNP in the last 168 hours.    The patient is critically ill with multiple organ systems failure and requires high complexity decision making for assessment and support, frequent evaluation and titration of therapies, application of advanced monitoring technologies and extensive interpretation of multiple databases. Critical Care Time devoted to patient care services described in this note is 30  minutes.     Christinia Gully, MD Pulmonary and [place] [phone] After 5:30 PM or weekends, use [facility] [phone]

## 2017-11-04 NOTE — Progress Notes (Signed)
At [place] notified this RN the filter had clotted. CRRT was stopped at that time for filter change. Blood was NOT returned to the patient. A new filter was primed with heparinized saline and CRRT was restarted at 2010 without incident.

## 2017-11-04 NOTE — Progress Notes (Signed)
S: Pt was transferred to the ICU for initiation of CVVHD yesterday with the assistance of PCCM and IR due to progressive oliguric ARF and worsening metabolic acidosis.  She has tolerated CVVHD well overnight but was crying out in pain and had to be given large doses of fentanyl to calm her as well as haldol.  O:BP (!) 140/35   Pulse 80   Temp 98.6 F (37 C) (Axillary)   Resp 19   Ht 5' 2"  (1.575 m)   Wt 120 kg   SpO2 97%   BMI 48.39 kg/m   Intake/Output Summary (Last 24 hours) at 11/04/2017 0851 Last data filed at 11/04/2017 0800 Gross per 24 hour  Intake 615.94 ml  Output 830 ml  Net -214.06 ml   Intake/Output: I/O last 3 completed shifts: In: 735.9 [P.O.:120; I.V.:20.6; Other:49; IV Piggyback:546.4] Out: 892 [Urine:275; Other:617]  Intake/Output this shift:  Total I/O In: -  Out: 26 [Other:38] Weight change:  Gen: obese WF resting comfortably in bed CVS: no rub Resp: scattered rhonchi MKL:KJZPHXTAV/WPVXY, +BS, firm Ext: 1+ edema   Recent Labs  Lab 10/30/17 0815 10/31/17 0631 11/01/17 0904 11/02/17 0556 11/03/17 0526 11/03/17 1559 11/04/17 0458  NA 131* 130* 133* 131* 129* 131* 135  K 4.5 4.4 4.5 4.6 5.1 4.6 3.5  CL 101 102 104 104 104 104 90*  CO2 18* 16* 16* 14* 11* 13* 31  GLUCOSE 148* 148* 266* 172* 199* 167* PENDING  BUN 81* 75* 81* 89* 87* 85* 62*  CREATININE 3.96* 4.28* 5.57* 6.17* 7.01* 7.52* 4.56*  ALBUMIN 2.9* 2.5*  --   --   --  2.6* 2.6*  CALCIUM 8.2* 7.4* 7.5* 7.5* 7.3* 7.4* 6.8*  PHOS  --   --  7.4*  --   --  8.6* 5.6*  AST 28 20  --   --   --   --   --   ALT 24 21  --   --   --   --   --    Liver Function Tests: Recent Labs  Lab 10/30/17 0815 10/31/17 0631 11/03/17 1559 11/04/17 0458  AST 28 20  --   --   ALT 24 21  --   --   ALKPHOS 151* 132*  --   --   BILITOT 0.5 0.7  --   --   PROT 7.4 6.2*  --   --   ALBUMIN 2.9* 2.5* 2.6* 2.6*   Recent Labs  Lab 10/30/17 0815  LIPASE 23   No results for input(s): AMMONIA in the last 168  hours. CBC: Recent Labs  Lab 10/30/17 0815 10/31/17 0631 11/01/17 0904  WBC 10.5 7.9 7.7  NEUTROABS 8.3*  --   --   HGB 8.2* 7.4* 7.9*  HCT 27.2* 25.0* 26.9*  MCV 67.3* 67.8* 68.4*  PLT 264 247 241   Cardiac Enzymes: Recent Labs  Lab 11/01/17 0904  CKTOTAL 247*   CBG: Recent Labs  Lab 11/03/17 0852 11/03/17 1349 11/03/17 1608 11/03/17 2154 11/04/17 0815  GLUCAP 196* 180* 180* 129* 84    Iron Studies: No results for input(s): IRON, TIBC, TRANSFERRIN, FERRITIN in the last 72 hours. Studies/Results: Dg Abd 1 View  Result Date: 11/03/2017 CLINICAL DATA:  Abdominal distension. EXAM: ABDOMEN - 1 VIEW COMPARISON:  CT, 10/30/2017 FINDINGS: Normal bowel gas pattern with no evidence of obstruction. Soft tissues are not well-defined due to body habitus. No convincing renal or ureteral stones. Status post cholecystectomy. No acute skeletal abnormality. IMPRESSION: 1.  No acute findings.  No evidence of bowel obstruction. Electronically Signed   By: Lajean Manes M.D.   On: 11/03/2017 11:17   Ir Fluoro Guide Cv Line Right  Result Date: 11/03/2017 INDICATION: End-stage renal disease. In need intravenous access for the initiation of hemodialysis. EXAM: NON-TUNNELED CENTRAL VENOUS HEMODIALYSIS CATHETER PLACEMENT WITH ULTRASOUND AND FLUOROSCOPIC GUIDANCE COMPARISON:  Chest radiograph - 10/31/2017 MEDICATIONS: None FLUOROSCOPY TIME:  24 seconds (14 mGy) COMPLICATIONS: None immediate. PROCEDURE: Informed written consent was obtained from patient's family after a discussion of the risks, benefits, and alternatives to treatment. Questions regarding the procedure were encouraged and answered. The right neck and chest were prepped with chlorhexidine in a sterile fashion, and a sterile drape was applied covering the operative field. Maximum barrier sterile technique with sterile gowns and gloves were used for the procedure. A timeout was performed prior to the initiation of the procedure. After the  overlying soft tissues were anesthetized, a small venotomy incision was created and a micropuncture kit was utilized to access the internal jugular vein. Real-time ultrasound guidance was utilized for vascular access including the acquisition of a permanent ultrasound image documenting patency of the accessed vessel. The microwire was utilized to measure appropriate catheter length. A stiff glidewire was advanced to the level of the IVC. Under fluoroscopic guidance, the venotomy was serially dilated, ultimately allowing placement of a 20 cm temporary Trialysis catheter with tip ultimately terminating within the superior aspect of the right atrium. Final catheter positioning was confirmed and documented with a spot radiographic image. The catheter aspirates and flushes normally. The catheter was flushed with appropriate volume heparin dwells. The catheter exit site was secured with a 0-Prolene retention suture. A dressing was placed. The patient tolerated the procedure well without immediate post procedural complication. IMPRESSION: Successful placement of a right internal jugular approach 20 cm temporary dialysis catheter with tip terminating with in the superior aspect of the right atrium. The catheter is ready for immediate use. PLAN: This catheter may be converted to a tunneled dialysis catheter at a later date as indicated. Electronically Signed   By: Sandi Mariscal M.D.   On: 11/03/2017 18:01   Ir US Guide Vasc Access Right  Result Date: 11/03/2017 INDICATION: End-stage renal disease. In need intravenous access for the initiation of hemodialysis. EXAM: NON-TUNNELED CENTRAL VENOUS HEMODIALYSIS CATHETER PLACEMENT WITH ULTRASOUND AND FLUOROSCOPIC GUIDANCE COMPARISON:  Chest radiograph - 10/31/2017 MEDICATIONS: None FLUOROSCOPY TIME:  24 seconds (14 mGy) COMPLICATIONS: None immediate. PROCEDURE: Informed written consent was obtained from patient's family after a discussion of the risks, benefits, and alternatives  to treatment. Questions regarding the procedure were encouraged and answered. The right neck and chest were prepped with chlorhexidine in a sterile fashion, and a sterile drape was applied covering the operative field. Maximum barrier sterile technique with sterile gowns and gloves were used for the procedure. A timeout was performed prior to the initiation of the procedure. After the overlying soft tissues were anesthetized, a small venotomy incision was created and a micropuncture kit was utilized to access the internal jugular vein. Real-time ultrasound guidance was utilized for vascular access including the acquisition of a permanent ultrasound image documenting patency of the accessed vessel. The microwire was utilized to measure appropriate catheter length. A stiff glidewire was advanced to the level of the IVC. Under fluoroscopic guidance, the venotomy was serially dilated, ultimately allowing placement of a 20 cm temporary Trialysis catheter with tip ultimately terminating within the superior aspect of the right atrium. Final catheter  positioning was confirmed and documented with a spot radiographic image. The catheter aspirates and flushes normally. The catheter was flushed with appropriate volume heparin dwells. The catheter exit site was secured with a 0-Prolene retention suture. A dressing was placed. The patient tolerated the procedure well without immediate post procedural complication. IMPRESSION: Successful placement of a right internal jugular approach 20 cm temporary dialysis catheter with tip terminating with in the superior aspect of the right atrium. The catheter is ready for immediate use. PLAN: This catheter may be converted to a tunneled dialysis catheter at a later date as indicated. Electronically Signed   By: Sandi Mariscal M.D.   On: 11/03/2017 18:01   . atorvastatin  40 mg Oral q1800  . furosemide  80 mg Intravenous BID  . insulin aspart  0-15 Units Subcutaneous TID WC  . insulin  aspart  0-5 Units Subcutaneous QHS  . insulin glargine  25 Units Subcutaneous QHS  . mouth rinse  15 mL Mouth Rinse BID  . metoprolol tartrate  25 mg Oral BID  . multivitamin  1 tablet Oral Daily  . multivitamin with minerals  1 tablet Oral Daily  . QUEtiapine  25 mg Oral QHS    BMET    Component Value Date/Time   NA 135 11/04/2017 0458   K 3.5 11/04/2017 0458   CL 90 (L) 11/04/2017 0458   CO2 31 11/04/2017 0458   GLUCOSE PENDING 11/04/2017 0458   BUN 62 (H) 11/04/2017 0458   CREATININE 4.56 (H) 11/04/2017 0458   CREATININE 0.85 08/14/2012 0930   CALCIUM 6.8 (L) 11/04/2017 0458   GFRNONAA 8 (L) 11/04/2017 0458   GFRNONAA 66 08/14/2012 0930   GFRAA 9 (L) 11/04/2017 0458   GFRAA 76 08/14/2012 0930   CBC    Component Value Date/Time   WBC 7.7 11/01/2017 0904   RBC 3.93 11/01/2017 0904   HGB 7.9 (L) 11/01/2017 0904   HGB 9.1 (L) 10/26/2009 1512   HCT 26.9 (L) 11/01/2017 0904   HCT 28.0 (L) 10/26/2009 1512   PLT 241 11/01/2017 0904   PLT 222 10/26/2009 1512   MCV 68.4 (L) 11/01/2017 0904   MCV 66.3 (L) 10/26/2009 1512   MCH 20.1 (L) 11/01/2017 0904   MCHC 29.4 (L) 11/01/2017 0904   RDW 16.9 (H) 11/01/2017 0904   RDW 17.9 (H) 10/26/2009 1512   LYMPHSABS 1.2 10/30/2017 0815   LYMPHSABS 1.5 10/26/2009 1512   MONOABS 1.0 10/30/2017 0815   MONOABS 0.4 10/26/2009 1512   EOSABS 0.0 10/30/2017 0815   EOSABS 0.1 10/26/2009 1512   BASOSABS 0.0 10/30/2017 0815   BASOSABS 0.0 10/26/2009 1512     Assessment/Plan:  1. AKIin setting of volume depletion- presumably ischemic ATN, oliguric. Unfortunately her volume status was worsening as well as her metabolic acidosis so she was started on CVVHD on 11/03/17.  Electrolytes have improved, however she remains oliguric/anuric despite IV lasix. 1. CVVHD with goal UF of 50-171m/hr as bp tolerates 2. Replacement fluids and dialysate all prismasate 4K/2.5Ca and is receiving heparin. 2. Volume/HTN- will cont to UF with CVVHD and  follow 3. Colitis- with diarrhea on IV ceftriaxone and flagyl 4. Metabolic acidosis due to #1, now overcorrected with CVVHD.  Will change replacement fluids to 4K/2.5Ca and follow. 5. Microcytic anemia- transfuse prn 6. DM type 2 7. HTN- off meds due to hypotension and volume depletion. 8. AMS- presumably due to metabolic encphalopathy and should improve with CVVHD.  JDonetta Potts MD [employer]([phone]

## 2017-11-04 NOTE — Progress Notes (Addendum)
Patient moaning, yelling out, and waving arms. Pt appears to be oriented to self at this time and asked this RN, "Am I dying?". Reoriented pt to place, time, and situation, although it does not appear she retained this information. She expressed she was in pain, but unable to state where the pain originates. Pt quickly became inconsolable and screaming "help me". Pain medication administered, although patient continued to moan and cry and become increasingly agitated trying to pull herself up and out of the bed. Reorientation and de-escalation attempts minimally successful. Haldol given. Will continue to monitor.

## 2017-11-04 NOTE — Progress Notes (Signed)
At 0610 CRRT filter clotted. CRRT was stopped, the patient was disconnected from the machine, and the filter was removed with blood remaining in filter. Blood was NOT returned to the patient. A new filter was placed and primed with heparinized saline. CRRT was restarted at 0650 without incident.

## 2017-11-04 NOTE — Progress Notes (Signed)
RN holding IV lasix per discussion with Dr. Marval Regal.

## 2017-11-05 DIAGNOSIS — A419 Sepsis, unspecified organism: Principal | ICD-10-CM

## 2017-11-05 DIAGNOSIS — R6521 Severe sepsis with septic shock: Secondary | ICD-10-CM

## 2017-11-05 LAB — RENAL FUNCTION PANEL
ANION GAP: 11 (ref 5–15)
Albumin: 2.7 g/dL — ABNORMAL LOW (ref 3.5–5.0)
Albumin: 2.7 g/dL — ABNORMAL LOW (ref 3.5–5.0)
Anion gap: 11 (ref 5–15)
BUN: 33 mg/dL — ABNORMAL HIGH (ref 8–23)
BUN: 27 mg/dL — ABNORMAL HIGH (ref 8–23)
CHLORIDE: 98 mmol/L (ref 98–111)
CHLORIDE: 101 mmol/L (ref 98–111)
CO2: 29 mmol/L (ref 22–32)
CO2: 25 mmol/L (ref 22–32)
Calcium: 7.2 mg/dL — ABNORMAL LOW (ref 8.9–10.3)
Calcium: 7.1 mg/dL — ABNORMAL LOW (ref 8.9–10.3)
Creatinine, Ser: 2.6 mg/dL — ABNORMAL HIGH (ref 0.44–1.00)
Creatinine, Ser: 2.23 mg/dL — ABNORMAL HIGH (ref 0.44–1.00)
GFR calc Af Amer: 22 mL/min — ABNORMAL LOW (ref >60)
GFR calc non Af Amer: 19 mL/min — ABNORMAL LOW (ref >60)
GFR, EST AFRICAN AMERICAN: 18 mL/min — AB (ref >60)
GFR, EST NON AFRICAN AMERICAN: 16 mL/min — AB (ref >60)
GLUCOSE: 111 mg/dL — AB (ref 70–99)
Glucose, Bld: 94 mg/dL (ref 70–99)
POTASSIUM: 4 mmol/L (ref 3.5–5.1)
POTASSIUM: 4.1 mmol/L (ref 3.5–5.1)
Phosphorus: 4.1 mg/dL (ref 2.5–4.6)
Phosphorus: 4.1 mg/dL (ref 2.5–4.6)
Sodium: 138 mmol/L (ref 135–145)
Sodium: 137 mmol/L (ref 135–145)

## 2017-11-05 LAB — GLUCOSE, CAPILLARY
GLUCOSE-CAPILLARY: 92 mg/dL (ref 70–99)
GLUCOSE-CAPILLARY: 103 mg/dL — AB (ref 70–99)
Glucose-Capillary: 105 mg/dL — ABNORMAL HIGH (ref 70–99)
Glucose-Capillary: 94 mg/dL (ref 70–99)

## 2017-11-05 LAB — MAGNESIUM: Magnesium: 2.5 mg/dL — ABNORMAL HIGH (ref 1.7–2.4)

## 2017-11-05 MED ORDER — METOPROLOL TARTRATE 5 MG/5ML IV SOLN
2.5000 mg | INTRAVENOUS | Status: DC | PRN
  Administered 2017-11-05: 5 mg via INTRAVENOUS
  Filled 2017-11-05: qty 5

## 2017-11-05 MED ORDER — FENTANYL CITRATE (PF) 100 MCG/2ML IJ SOLN
50.0000 ug | INTRAMUSCULAR | Status: DC | PRN
  Administered 2017-11-05 – 2017-11-07 (×20): 100 ug via INTRAVENOUS
  Administered 2017-11-07 (×2): 50 ug via INTRAVENOUS
  Administered 2017-11-07 – 2017-11-08 (×4): 100 ug via INTRAVENOUS
  Filled 2017-11-05 (×29): qty 2

## 2017-11-05 NOTE — Progress Notes (Signed)
[facility] Progress Note Patient Name: Lauren Crosby DOB: Apr 18, 1931 MRN: [id number]   Date of Service  11/05/2017  HPI/Events of Note  Hypertension - BP = 176/68 and HR = 103. Patient can't take ordered Metoprolol PO.   eICU Interventions  Will order: 1. D/C Metoprolol PO. 2. Metoprolol 2.5 - 5 mg IV Q 3 hours PRN SBP > 170 or DBP > 100.     Intervention Category Major Interventions: Other:  Lysle Dingwall 11/05/2017, 9:06 PM

## 2017-11-05 NOTE — Progress Notes (Signed)
PULMONARY / CRITICAL CARE MEDICINE   NAME:  PRIYANKA CAUSEY, MRN:  [id number], DOB:  December 05, 1931, LOS: 5 ADMISSION DATE:  10/30/2017, CONSULTATION DATE:  11/03/17 REFERRING MD:  [facility]/ renal  CHIEF COMPLAINT:  Acidosis/ renal failure/ delirium   BRIEF HISTORY:    86 yowf morbid obesity  Complicated byh DM/ CRI and hbp adm 10/30/17 with diarrhea and worsening renal insuff  With dx of prob c diff colitis/ met acidosis and worsening mental status am 10/26 so transferred to ICU for   HD catheter placement / initiation of cvvh and pccm service consulted.     SIGNIFICANT EVENTS:  started CVVH  Pm 10/26  STUDIES:     CULTURES:  C diff 10/22 pos pcr/ pos antigen neg for toxin UC 10/26 >>>   ANTIBIOTICS:  Vanc po 10/22 > 10/23 Flagyl 10/22> Rocephin 10/22   LINES/TUBES:   HD catheter per IR  R SCVein  10/26  CONSULTANTS:  Renal  10/24   SUBJECTIVE:  Agitated not coherent, responded to increase doses of fent/haldol per elink Resting quietly this am   CONSTITUTIONAL: BP (!) 150/39   Pulse 91   Temp 97.8 F (36.6 C) (Oral)   Resp 16   Ht _0  (1.575 m)   Wt 118.2 kg   SpO2 98%   BMI 47.66 kg/m   On 2lpm NP  I/O last 3 completed shifts: In: 701.3 [I.V.:19.7; Other:87; IV CMKLKJZPH:150.5] Out: 6979 [Urine:303; Other:2844]       PHYSICAL EXAM: General: acute on chronically ill appearing female, confused and screaming at times HEENT: /AT, PERRL, EOM-I and MMM Heart: RRR, Nl S1/S2 and -M/R/G Lungs: Decreased BS diffusely Abdomen: Soft, NT, ND and +BS Ext: -edema and -tenderness Neuro: Confused but moving all ext to commands  RESOLVED PROBLEM LIST   ASSESSMENT AND PLAN    1)  Possible low grade GI sepsis ? Related to [facility]  - Rocephin - Flagyl - BP support - F/U on cultures  2) Met acidosis with AG and nonAG components  c/w ARI/ sepsis  - Continue CRRT per renal, ?change to HD - BMET in AM - Replace electrolytes as indicated - KVO IVF  3) AMS c/w TME  -  No focal deficits - Haldol IV - Fentanyl for pain IV - Frequent reorientation  4) DM Sugars down this am after lantus 10/26 pm > d/c lantus and just use SSI  - CBG - ISS - TF via NGT if unable to take PO  SUMMARY OF TODAY'S PLAN:  See above  Best Practice / Goals of Care / Disposition.   DVT PROPHYLAXIS: PAS SUP: Pepcid IV NUTRITION: none  for now MOBILITY:bedrest for now GOALS OF CARE: full code for now  FAMILY DISCUSSIONS: awarit to see if responds to CVVH DISPOSITION  Hopefully back to baseline / husband very attentive at bedside but hard of hearing and not sure he always gets the picture   LABS  Glucose Recent Labs  Lab 11/03/17 2154 11/04/17 0815 11/04/17 1146 11/04/17 1551 11/04/17 2230 11/05/17 0753  GLUCAP 129* 84 83 90 92 92    BMET Recent Labs  Lab 11/03/17 1559 11/04/17 0458 11/05/17 0351  NA 131* 135 138  K 4.6 3.5 4.0  CL 104 90* 98  CO2 13* 31 29  BUN 85* 62* 33*  CREATININE 7.52* 4.56* 2.60*  GLUCOSE 167* 103* 94    Liver Enzymes Recent Labs  Lab 10/30/17 0815 10/31/17 0631 11/03/17 1559 11/04/17 0458 11/05/17 0351  AST  28 20  --   --   --   ALT 24 21  --   --   --   ALKPHOS 151* 132*  --   --   --   BILITOT 0.5 0.7  --   --   --   ALBUMIN 2.9* 2.5* 2.6* 2.6* 2.7*    Electrolytes Recent Labs  Lab 11/03/17 1559 11/04/17 0458 11/05/17 0351  CALCIUM 7.4* 6.8* 7.2*  MG  --  2.4 2.5*  PHOS 8.6* 5.6* 4.1    CBC Recent Labs  Lab 10/30/17 0815 10/31/17 0631 11/01/17 0904  WBC 10.5 7.9 7.7  HGB 8.2* 7.4* 7.9*  HCT 27.2* 25.0* 26.9*  PLT 264 247 241    ABG No results for input(s): PHART, PCO2ART, PO2ART in the last 168 hours.  Coag's No results for input(s): APTT, INR in the last 168 hours.  Sepsis Markers No results for input(s): LATICACIDVEN, PROCALCITON, O2SATVEN in the last 168 hours.  Cardiac Enzymes No results for input(s): TROPONINI, PROBNP in the last 168 hours.   The patient is critically ill with  multiple organ systems failure and requires high complexity decision making for assessment and support, frequent evaluation and titration of therapies, application of advanced monitoring technologies and extensive interpretation of multiple databases.   Critical Care Time devoted to patient care services described in this note is  33  Minutes. This time reflects time of care of this signee Dr Jennet Maduro. This critical care time does not reflect procedure time, or teaching time or supervisory time of PA/NP/Med student/Med Resident etc but could involve care discussion time.  Rush Farmer, M.D. [facility] Pulmonary/Critical Care Medicine. Pager: [phone]. After hours pager: [phone].

## 2017-11-05 NOTE — Progress Notes (Signed)
Palliative Medicine consult noted. Due to high referral volume, there may be a delay seeing this patient. Please call the Palliative Medicine Team office at [phone] if recommendations are needed in the interim.  Thank you for inviting us to see this patient.  Krystle Polcyn G. Klaus Casteneda, RN, BSN, CHPN Palliative Medicine Team 11/05/2017 2:28 PM Office [phone] 

## 2017-11-05 NOTE — Progress Notes (Signed)
Physical Therapy Discharge Patient Details Name: Lauren Crosby MRN: [id number] DOB: [birth year] Today's Date: 11/05/2017 Time:  -     Patient discharged from PT services secondary to medical decline - will need to re-order PT to resume therapy services. To ICU for CRRT.     GP     [name] 11/05/2017, 7:21 AM [facility] Pager [phone] Office [phone]

## 2017-11-05 NOTE — Progress Notes (Signed)
[facility] ROUNDING NOTE   Subjective:   Worsening mental status probable C. difficile colitis continues on CRRT.  BP 1 4561 pulse 88 O2 sats 99% nasal cannula 2 L oliguric urine output 218 cc 11/04/2017 weight 118.2 kg admitting weight 115.7 kg  Sodium 138 potassium 4.0 chloride 98 CO2 29 BUN 33 creatinine 2.6 calcium 7.2 magnesium 2.5 albumin 2.7   Objective:  Vital signs in last 24 hours:  Temp:  [97.7 F (36.5 C)-98.4 F (36.9 C)] 98.4 F (36.9 C) (10/28 1200) Pulse Rate:  [79-91] 90 (10/28 1200) Resp:  [13-22] 17 (10/28 1200) BP: (97-150)/(24-87) 146/57 (10/28 1200) SpO2:  [97 %-100 %] 99 % (10/28 1200) Weight:  [118.2 kg] 118.2 kg (10/28 0341)  Weight change: -5.9 kg Filed Weights   11/03/17 1044 11/04/17 0307 11/05/17 0341  Weight: 124.1 kg 120 kg 118.2 kg    Intake/Output: I/O last 3 completed shifts: In: 701.3 [I.V.:19.7; Other:87; IV RPRXYVOPF:292.4] Out: 4628 [Urine:303; Other:2844]   Intake/Output this shift:  Total I/O In: 65.3 [I.V.:0.3; Other:65] Out: 423 [Urine:35; Other:388]  CVS- RRR no murmurs RS- CTA no wheezes occasional rhonchi ABD- BS present soft non-distended EXT-1+ edema thickened skin.   Basic Metabolic Panel: Recent Labs  Lab 11/01/17 0904 11/02/17 0556 11/03/17 0526 11/03/17 1559 11/04/17 0458 11/05/17 0351  NA 133* 131* 129* 131* 135 138  K 4.5 4.6 5.1 4.6 3.5 4.0  CL 104 104 104 104 90* 98  CO2 16* 14* 11* 13* 31 29  GLUCOSE 266* 172* 199* 167* 103* 94  BUN 81* 89* 87* 85* 62* 33*  CREATININE 5.57* 6.17* 7.01* 7.52* 4.56* 2.60*  CALCIUM 7.5* 7.5* 7.3* 7.4* 6.8* 7.2*  MG  --   --   --   --  2.4 2.5*  PHOS 7.4*  --   --  8.6* 5.6* 4.1    Liver Function Tests: Recent Labs  Lab 10/30/17 0815 10/31/17 0631 11/03/17 1559 11/04/17 0458 11/05/17 0351  AST 28 20  --   --   --   ALT 24 21  --   --   --   ALKPHOS 151* 132*  --   --   --   BILITOT 0.5 0.7  --   --   --   PROT 7.4 6.2*  --   --   --    ALBUMIN 2.9* 2.5* 2.6* 2.6* 2.7*   Recent Labs  Lab 10/30/17 0815  LIPASE 23   No results for input(s): AMMONIA in the last 168 hours.  CBC: Recent Labs  Lab 10/30/17 0815 10/31/17 0631 11/01/17 0904  WBC 10.5 7.9 7.7  NEUTROABS 8.3*  --   --   HGB 8.2* 7.4* 7.9*  HCT 27.2* 25.0* 26.9*  MCV 67.3* 67.8* 68.4*  PLT 264 247 241    Cardiac Enzymes: Recent Labs  Lab 11/01/17 0904  CKTOTAL 247*    BNP: Invalid input(s): POCBNP  CBG: Recent Labs  Lab 11/04/17 1146 11/04/17 1551 11/04/17 2230 11/05/17 0753 11/05/17 1200  GLUCAP 83 90 92 92 105*    Microbiology: Results for orders placed or performed during the hospital encounter of 10/30/17  Gastrointestinal Panel by PCR , Stool     Status: None   Collection Time: 10/30/17  9:59 AM  Result Value Ref Range Status   Campylobacter species NOT DETECTED NOT DETECTED Final   Plesimonas shigelloides NOT DETECTED NOT DETECTED Final   Salmonella species NOT DETECTED NOT DETECTED Final   Yersinia enterocolitica NOT DETECTED NOT  DETECTED Final   Vibrio species NOT DETECTED NOT DETECTED Final   Vibrio cholerae NOT DETECTED NOT DETECTED Final   Enteroaggregative E coli (EAEC) NOT DETECTED NOT DETECTED Final   Enteropathogenic E coli (EPEC) NOT DETECTED NOT DETECTED Final   Enterotoxigenic E coli (ETEC) NOT DETECTED NOT DETECTED Final   Shiga like toxin producing E coli (STEC) NOT DETECTED NOT DETECTED Final   Shigella/Enteroinvasive E coli (EIEC) NOT DETECTED NOT DETECTED Final   Cryptosporidium NOT DETECTED NOT DETECTED Final   Cyclospora cayetanensis NOT DETECTED NOT DETECTED Final   Entamoeba histolytica NOT DETECTED NOT DETECTED Final   Giardia lamblia NOT DETECTED NOT DETECTED Final   Adenovirus F40/41 NOT DETECTED NOT DETECTED Final   Astrovirus NOT DETECTED NOT DETECTED Final   Norovirus GI/GII NOT DETECTED NOT DETECTED Final   Rotavirus A NOT DETECTED NOT DETECTED Final   Sapovirus (I, II, IV, and V) NOT  DETECTED NOT DETECTED Final    Comment: Performed at [facility], [address] [place], [place] [address]  C difficile quick scan w PCR reflex     Status: Abnormal   Collection Time: 10/30/17  9:59 AM  Result Value Ref Range Status   C Diff antigen POSITIVE (A) NEGATIVE Final   C Diff toxin NEGATIVE NEGATIVE Final   C Diff interpretation Results are indeterminate. See PCR results.  Final    Comment: Performed at [facility], [place] [address], [place], [place] [address]  C. Diff by PCR, Reflexed     Status: Abnormal   Collection Time: 10/30/17  9:59 AM  Result Value Ref Range Status   Toxigenic C. Difficile by PCR POSITIVE (A) NEGATIVE Final    Comment: Positive for toxigenic C. difficile with little to no toxin production. Only treat if clinical presentation suggests symptomatic illness. Performed at [facility], [place] [address] [place], [place] [address]   Urine Culture     Status: None   Collection Time: 11/03/17  1:07 AM  Result Value Ref Range Status   Specimen Description   Final    URINE, CATHETERIZED Performed at [facility] [address] [place], [place] [address]    Special Requests NONE  Final   Culture   Final    NO GROWTH Performed at [facility], [place] [address], [place], [place] [address]    Report Status 11/04/2017 FINAL  Final  MRSA PCR Screening     Status: None   Collection Time: 11/03/17 10:49 AM  Result Value Ref Range Status   MRSA by PCR NEGATIVE NEGATIVE Final    Comment:        The GeneXpert MRSA Assay (FDA approved for NASAL specimens only), is one component of a comprehensive MRSA colonization surveillance program. It is not intended to diagnose MRSA infection nor to guide or monitor treatment for MRSA infections. Performed at [facility], [place] [address], [place], [place] [address]     Coagulation Studies: No results for input(s): LABPROT, INR in the last  72 hours.  Urinalysis: No results for input(s): COLORURINE, LABSPEC, PHURINE, GLUCOSEU, HGBUR, BILIRUBINUR, KETONESUR, PROTEINUR, UROBILINOGEN, NITRITE, LEUKOCYTESUR in the last 72 hours.  Invalid input(s): APPERANCEUR    Imaging: Ir Fluoro Guide Cv Line Right  Result Date: 11/03/2017 INDICATION: End-stage renal disease. In need intravenous access for the initiation of hemodialysis. EXAM: NON-TUNNELED CENTRAL VENOUS HEMODIALYSIS CATHETER PLACEMENT WITH ULTRASOUND AND FLUOROSCOPIC GUIDANCE COMPARISON:  Chest radiograph - 10/31/2017 MEDICATIONS: None FLUOROSCOPY  TIME:  24 seconds (14 mGy) COMPLICATIONS: None immediate. PROCEDURE: Informed written consent was obtained from patient's family after a discussion of the risks, benefits, and alternatives to treatment. Questions regarding the procedure were encouraged and answered. The right neck and chest were prepped with chlorhexidine in a sterile fashion, and a sterile drape was applied covering the operative field. Maximum barrier sterile technique with sterile gowns and gloves were used for the procedure. A timeout was performed prior to the initiation of the procedure. After the overlying soft tissues were anesthetized, a small venotomy incision was created and a micropuncture kit was utilized to access the internal jugular vein. Real-time ultrasound guidance was utilized for vascular access including the acquisition of a permanent ultrasound image documenting patency of the accessed vessel. The microwire was utilized to measure appropriate catheter length. A stiff glidewire was advanced to the level of the IVC. Under fluoroscopic guidance, the venotomy was serially dilated, ultimately allowing placement of a 20 cm temporary Trialysis catheter with tip ultimately terminating within the superior aspect of the right atrium. Final catheter positioning was confirmed and documented with a spot radiographic image. The catheter aspirates and flushes normally. The  catheter was flushed with appropriate volume heparin dwells. The catheter exit site was secured with a 0-Prolene retention suture. A dressing was placed. The patient tolerated the procedure well without immediate post procedural complication. IMPRESSION: Successful placement of a right internal jugular approach 20 cm temporary dialysis catheter with tip terminating with in the superior aspect of the right atrium. The catheter is ready for immediate use. PLAN: This catheter may be converted to a tunneled dialysis catheter at a later date as indicated. Electronically Signed   By: Sandi Mariscal M.D.   On: 11/03/2017 18:01   Ir US Guide Vasc Access Right  Result Date: 11/03/2017 INDICATION: End-stage renal disease. In need intravenous access for the initiation of hemodialysis. EXAM: NON-TUNNELED CENTRAL VENOUS HEMODIALYSIS CATHETER PLACEMENT WITH ULTRASOUND AND FLUOROSCOPIC GUIDANCE COMPARISON:  Chest radiograph - 10/31/2017 MEDICATIONS: None FLUOROSCOPY TIME:  24 seconds (14 mGy) COMPLICATIONS: None immediate. PROCEDURE: Informed written consent was obtained from patient's family after a discussion of the risks, benefits, and alternatives to treatment. Questions regarding the procedure were encouraged and answered. The right neck and chest were prepped with chlorhexidine in a sterile fashion, and a sterile drape was applied covering the operative field. Maximum barrier sterile technique with sterile gowns and gloves were used for the procedure. A timeout was performed prior to the initiation of the procedure. After the overlying soft tissues were anesthetized, a small venotomy incision was created and a micropuncture kit was utilized to access the internal jugular vein. Real-time ultrasound guidance was utilized for vascular access including the acquisition of a permanent ultrasound image documenting patency of the accessed vessel. The microwire was utilized to measure appropriate catheter length. A stiff glidewire  was advanced to the level of the IVC. Under fluoroscopic guidance, the venotomy was serially dilated, ultimately allowing placement of a 20 cm temporary Trialysis catheter with tip ultimately terminating within the superior aspect of the right atrium. Final catheter positioning was confirmed and documented with a spot radiographic image. The catheter aspirates and flushes normally. The catheter was flushed with appropriate volume heparin dwells. The catheter exit site was secured with a 0-Prolene retention suture. A dressing was placed. The patient tolerated the procedure well without immediate post procedural complication. IMPRESSION: Successful placement of a right internal jugular approach 20 cm temporary dialysis catheter with tip terminating with  in the superior aspect of the right atrium. The catheter is ready for immediate use. PLAN: This catheter may be converted to a tunneled dialysis catheter at a later date as indicated. Electronically Signed   By: Sandi Mariscal M.D.   On: 11/03/2017 18:01     Medications:   .  prismasol BGK 4/2.5 500 mL/hr at 11/05/17 0610  .  prismasol BGK 4/2.5 300 mL/hr at 11/05/17 0310  . sodium chloride 10 mL/hr at 11/05/17 1214  . famotidine (PEPCID) IV Stopped (11/04/17 1850)  . heparin    . metronidazole Stopped (11/05/17 3244)  . prismasol BGK 4/2.5 1,500 mL/hr at 11/05/17 0932   . Chlorhexidine Gluconate Cloth  6 each Topical Daily  . furosemide  80 mg Intravenous BID  . insulin aspart  0-15 Units Subcutaneous TID WC  . insulin aspart  0-5 Units Subcutaneous QHS  . mouth rinse  15 mL Mouth Rinse BID  . metoprolol tartrate  25 mg Oral BID  . sodium chloride flush  10-40 mL Intracatheter Q12H   acetaminophen, fentaNYL (SUBLIMAZE) injection, haloperidol lactate, heparin, heparin, ipratropium-albuterol, sodium chloride flush  Assessment/ Plan:  1. AKIin setting of volume depletion- presumably ischemic ATN, oliguric. Unfortunately her volume status was  worsening as well as her metabolic acidosis so she was started on CVVHD on 11/03/17.  Electrolytes have improved, however she remains oliguric/anuricdespite IV lasix.  Have discontinued IV Lasix at this time 1. CVVHD with goal UF of 50-148m/hr as bp tolerates continues to UF fluid. 2. Replacement fluids and dialysate all prismasate 4K/2.5Ca and is receiving heparin. 2. Volume/HTN- will cont to UF with CVVHD and follow.  Fluid removal with dialysis 3. Colitis- with diarrhea onIVceftriaxone and flagyl 4. Metabolic acidosis due to #1, now overcorrected with CVVHD.  Will change replacement fluids to 4K/2.5Ca and follow. 5. Microcytic anemia- transfuse prn 6. DM type 2 7. HTN- off meds due to hypotension and volume depletion. 8. AMS- presumably due to metabolic encphalopathy and should improve with CVVHD.  LOS: 5[place]_0 _1 :44 PM

## 2017-11-05 NOTE — Care Management Note (Signed)
Case Management Note  Patient Details  Name: Lauren Crosby MRN: [id number] Date of Birth: Sep 28, 1931  Subjective/Objective:                  Renal failure on crrt  ActionWill follow for progression of care and clinical status. Will follow for case management needs none present at this time./Plan:   Expected Discharge Date:  (unknown)               Expected Discharge Plan:  Home/Self Care  In-House Referral:     Discharge planning Services  CM Consult  Post Acute Care Choice:    Choice offered to:     DME Arranged:    DME Agency:     HH Arranged:    [place] Agency:     Status of Service:  In process, will continue to follow  If discussed at Long Length of Stay Meetings, dates discussed:    Additional Comments:  Leeroy Cha, RN 11/05/2017, 8:58 AM

## 2017-11-06 DIAGNOSIS — K529 Noninfective gastroenteritis and colitis, unspecified: Secondary | ICD-10-CM

## 2017-11-06 DIAGNOSIS — Z7189 Other specified counseling: Secondary | ICD-10-CM

## 2017-11-06 DIAGNOSIS — Z515 Encounter for palliative care: Secondary | ICD-10-CM

## 2017-11-06 LAB — GLUCOSE, CAPILLARY
GLUCOSE-CAPILLARY: 102 mg/dL — AB (ref 70–99)
GLUCOSE-CAPILLARY: 121 mg/dL — AB (ref 70–99)
Glucose-Capillary: 114 mg/dL — ABNORMAL HIGH (ref 70–99)
Glucose-Capillary: 115 mg/dL — ABNORMAL HIGH (ref 70–99)

## 2017-11-06 LAB — CBC
HEMATOCRIT: 25.3 % — AB (ref 36.0–46.0)
HEMOGLOBIN: 7 g/dL — AB (ref 12.0–15.0)
MCH: 19.9 pg — ABNORMAL LOW (ref 26.0–34.0)
MCHC: 27.7 g/dL — AB (ref 30.0–36.0)
MCV: 71.9 fL — ABNORMAL LOW (ref 80.0–100.0)
Platelets: 143 10*3/uL — ABNORMAL LOW (ref 150–400)
RBC: 3.52 MIL/uL — AB (ref 3.87–5.11)
RDW: 19.7 % — ABNORMAL HIGH (ref 11.5–15.5)
WBC: 8.5 10*3/uL (ref 4.0–10.5)
nRBC: 0.6 % — ABNORMAL HIGH (ref 0.0–0.2)

## 2017-11-06 LAB — BASIC METABOLIC PANEL
Anion gap: 11 (ref 5–15)
BUN: 22 mg/dL (ref 8–23)
CALCIUM: 7.2 mg/dL — AB (ref 8.9–10.3)
CO2: 24 mmol/L (ref 22–32)
Chloride: 103 mmol/L (ref 98–111)
Creatinine, Ser: 1.93 mg/dL — ABNORMAL HIGH (ref 0.44–1.00)
GFR calc Af Amer: 26 mL/min — ABNORMAL LOW (ref >60)
GFR, EST NON AFRICAN AMERICAN: 23 mL/min — AB (ref >60)
GLUCOSE: 106 mg/dL — AB (ref 70–99)
Potassium: 4.3 mmol/L (ref 3.5–5.1)
Sodium: 138 mmol/L (ref 135–145)

## 2017-11-06 LAB — RENAL FUNCTION PANEL
ALBUMIN: 2.6 g/dL — AB (ref 3.5–5.0)
ANION GAP: 13 (ref 5–15)
BUN: 19 mg/dL (ref 8–23)
CALCIUM: 7 mg/dL — AB (ref 8.9–10.3)
CO2: 21 mmol/L — ABNORMAL LOW (ref 22–32)
Chloride: 105 mmol/L (ref 98–111)
Creatinine, Ser: 1.72 mg/dL — ABNORMAL HIGH (ref 0.44–1.00)
GFR calc Af Amer: 30 mL/min — ABNORMAL LOW (ref >60)
GFR, EST NON AFRICAN AMERICAN: 26 mL/min — AB (ref >60)
Glucose, Bld: 119 mg/dL — ABNORMAL HIGH (ref 70–99)
POTASSIUM: 4.2 mmol/L (ref 3.5–5.1)
Phosphorus: 3.8 mg/dL (ref 2.5–4.6)
SODIUM: 139 mmol/L (ref 135–145)

## 2017-11-06 LAB — PHOSPHORUS: Phosphorus: 3.7 mg/dL (ref 2.5–4.6)

## 2017-11-06 LAB — HEMOGLOBIN AND HEMATOCRIT, BLOOD
HCT: 26.4 % — ABNORMAL LOW (ref 36.0–46.0)
Hemoglobin: 7.4 g/dL — ABNORMAL LOW (ref 12.0–15.0)

## 2017-11-06 LAB — PREPARE RBC (CROSSMATCH)

## 2017-11-06 LAB — MAGNESIUM: MAGNESIUM: 2.5 mg/dL — AB (ref 1.7–2.4)

## 2017-11-06 MED ORDER — SODIUM CHLORIDE 0.9% IV SOLUTION: Freq: Once | INTRAVENOUS | Status: DC

## 2017-11-06 MED ORDER — ORAL CARE MOUTH RINSE
15.0000 mL | Freq: Two times a day (BID) | OROMUCOSAL | Status: DC
  Administered 2017-11-06 – 2017-11-16 (×17): 15 mL via OROMUCOSAL

## 2017-11-06 NOTE — Progress Notes (Signed)
PMT progress note  Patient is resting in bed, she follows some commands for bedside RN. Doesn't verbalize meaningfully.  No family at bedside in am.  Call placed and awaiting further contact with daughter Alla German for additional goals of care discussions, Dr Domingo Cocking with Palliative Care has initiated goals of care discussions with the patient's husband. Also noted, PCCM colleague Dr. Pura Spice discussions with husband. Renal colleagues also following.   BP (!) 120/52   Pulse 84   Temp (!) 97.5 F (36.4 C) (Oral)   Resp 14   Ht _0  (1.575 m)   Wt 119.4 kg   SpO2 100%   BMI 48.15 kg/m  Labs and imaging noted  CRRT ongoing.   Weak appearing lady No distress Appears deconditioned Not in resp distress No edema noted Confused  GI sepsis Met acidosis Altered mental status Has history of DM Recent C Diff  AKI  Continue current mode of care Agree with partial code Monitor hospital course and disease trajectory to help guide further decision making.  25 minutes spent [facility] palliative medicine team [phone]

## 2017-11-06 NOTE — Progress Notes (Signed)
PULMONARY / CRITICAL CARE MEDICINE   NAME:  Lauren Crosby, MRN:  [id number], DOB:  [birth year], LOS: 6 ADMISSION DATE:  10/30/2017, CONSULTATION DATE:  11/03/17 REFERRING MD:  [facility]/ renal  CHIEF COMPLAINT:  Acidosis/ renal failure/ delirium   BRIEF HISTORY:    86 yowf morbid obesity  Complicated byh DM/ CRI and hbp adm 10/30/17 with diarrhea and worsening renal insuff  With dx of prob c diff colitis/ met acidosis and worsening mental status am 10/26 so transferred to ICU for   HD catheter placement / initiation of cvvh and pccm service consulted.     SIGNIFICANT EVENTS:  started CVVH  Pm 10/26  STUDIES:     CULTURES:  C diff 10/22 pos pcr/ pos antigen neg for toxin UC 10/26 >>>   ANTIBIOTICS:  Vanc po 10/22 > 10/23 Flagyl 10/22> Rocephin 10/22   LINES/TUBES:   HD catheter per IR  R SCVein  10/26  CONSULTANTS:  Renal  10/24   SUBJECTIVE:  Continues to be agitated and confused overnight Continues to complain of diffuse pain   CONSTITUTIONAL: BP (!) 147/59   Pulse 88   Temp 97.7 F (36.5 C) (Oral)   Resp 18   Ht 5' 2"  (1.575 m)   Wt 119.4 kg   SpO2 99%   BMI 48.15 kg/m   On 2lpm NP  I/O last 3 completed shifts: In: 863.3 [I.V.:13.7; Other:300; IV Piggyback:549.6] Out: 4097 [Urine:140; Other:2608]       PHYSICAL EXAM: General: Acute on chronically ill appearing female, NAD HEENT: /AT, PERRL, EOM-spontaneous and MMM Heart: RRR, Nl S1/S2 and -M/R/G Lungs: Diminished BS diffusely Abdomen: Soft, NT, ND and +BS Ext: -edema and -tenderness Neuro: Confused but moving all ext spontaneously  RESOLVED PROBLEM LIST   ASSESSMENT AND PLAN    1)  Possible low grade GI sepsis ? Related to [facility]  - Continue rocephin - Continue flagyl - BP support with pressors if needed - Cultures noted  2) Met acidosis with AG and nonAG components  c/w ARI/ sepsis  - Continue CRRT for volume removal per nephrology - BMET in AM - Replace electrolytes as indicated - KVO  IVF  3) AMS c/w TME  - No focal deficits - PRN Haldol - PRN fentanyl for pain - Frequent reorientation  4) DM Sugars down this am after lantus 10/26 pm > d/c lantus and just use SSI  - CBG - ISS - TF per nutrition  5) Anemia: no evidence of bleeding - Transfuse 1 unit pRBC - CBC in AM  SUMMARY OF TODAY'S PLAN:  Had an extensive discussion with the husband.  He is very hopeful and did not want to discuss anything negative.  However, I had to inform him of both negative and positive access of her care.  After a lengthy discussion, decision was made to proceed with full medical care but no intubation/CPR/cardioversion.  He did however agree that if the patient begins to be in pain and suffering is that we transition to comfort care.  Will place code status change as above.  Best Practice / Goals of Care / Disposition.   DVT PROPHYLAXIS: PAS SUP: Pepcid IV NUTRITION: none  for now MOBILITY:bedrest for now GOALS OF CARE: full code for now  FAMILY DISCUSSIONS: awarit to see if responds to CVVH DISPOSITION  Hopefully back to baseline / husband very attentive at bedside but hard of hearing and not sure he always gets the picture   LABS  Glucose Recent Labs  Lab 11/04/17 2230 11/05/17 0753 11/05/17 1200 11/05/17 1625 11/05/17 2200 11/06/17 0725  GLUCAP 92 92 105* 103* 94 102*    BMET Recent Labs  Lab 11/05/17 0351 11/05/17 1600 11/06/17 0416  NA 138 137 138  K 4.0 4.1 4.3  CL 98 101 103  CO2 29 25 24   BUN 33* 27* 22  CREATININE 2.60* 2.23* 1.93*  GLUCOSE 94 111* 106*    Liver Enzymes Recent Labs  Lab 10/31/17 0631  11/04/17 0458 11/05/17 0351 11/05/17 1600  AST 20  --   --   --   --   ALT 21  --   --   --   --   ALKPHOS 132*  --   --   --   --   BILITOT 0.7  --   --   --   --   ALBUMIN 2.5*   < > 2.6* 2.7* 2.7*   < > = values in this interval not displayed.    Electrolytes Recent Labs  Lab 11/04/17 0458 11/05/17 0351 11/05/17 1600 11/06/17 0416   CALCIUM 6.8* 7.2* 7.1* 7.2*  MG 2.4 2.5*  --  2.5*  PHOS 5.6* 4.1 4.1 3.7    CBC Recent Labs  Lab 10/31/17 0631 11/01/17 0904 11/06/17 0416  WBC 7.9 7.7 8.5  HGB 7.4* 7.9* 7.0*  HCT 25.0* 26.9* 25.3*  PLT 247 241 143*    ABG No results for input(s): PHART, PCO2ART, PO2ART in the last 168 hours.  Coag's No results for input(s): APTT, INR in the last 168 hours.  Sepsis Markers No results for input(s): LATICACIDVEN, PROCALCITON, O2SATVEN in the last 168 hours.  Cardiac Enzymes No results for input(s): TROPONINI, PROBNP in the last 168 hours.   The patient is critically ill with multiple organ systems failure and requires high complexity decision making for assessment and support, frequent evaluation and titration of therapies, application of advanced monitoring technologies and extensive interpretation of multiple databases.   Critical Care Time devoted to patient care services described in this note is  40  Minutes. This time reflects time of care of this signee Dr Jennet Maduro. This critical care time does not reflect procedure time, or teaching time or supervisory time of PA/NP/Med student/Med Resident etc but could involve care discussion time.  Rush Farmer, M.D. [facility] Pulmonary/Critical Care Medicine. Pager: [phone]. After hours pager: [phone].

## 2017-11-06 NOTE — Consult Note (Signed)
   [facility] CM Inpatient Consult   11/06/2017  Lauren Crosby Dec 10, 1931 [id number]    Patient screened for potential [facility] Care Management services due to unplanned readmission risk score of 30% (extreme).  Chart reviewed. Noted palliative medicine team following for additional goals of care discussions. Patient remains in ICU.  No identifiable [facility] Care Management needs assessed at this time.  Marthenia Rolling, MSN-Ed, RN,BSN [facility] Liaison [phone]

## 2017-11-06 NOTE — Progress Notes (Signed)
[facility] ROUNDING NOTE   Subjective:   Continues on CRRT treatment no complaints  Blood pressure 121/39 pulse 85 temperature 97.5 O2 sats 99% 2 L nasal cannula removal 1.864 L on dialysis Room 138 potassium 4.3 chloride 103 CO2 24 BUN 22 creatinine 1.93 glucose 106 phosphorus 3.7 albumin 2.7 magnesium 2.5 WBC 8.5 hemoglobin 7.0 platelets 143   Objective:  Vital signs in last 24 hours:  Temp:  [97.5 F (36.4 C)-98.2 F (36.8 C)] 97.5 F (36.4 C) (10/29 1216) Pulse Rate:  [78-97] 85 (10/29 1216) Resp:  [9-18] 13 (10/29 1216) BP: (104-186)/(21-102) 121/39 (10/29 1216) SpO2:  [97 %-100 %] 99 % (10/29 1216) Weight:  [119.4 kg] 119.4 kg (10/29 0400)  Weight change: 1.2 kg Filed Weights   11/04/17 0307 11/05/17 0341 11/06/17 0400  Weight: 120 kg 118.2 kg 119.4 kg    Intake/Output: I/O last 3 completed shifts: In: 863.3 [I.V.:13.7; Other:300; IV Piggyback:549.6] Out: 8756 [Urine:140; Other:2608]   Intake/Output this shift:  Total I/O In: 44.7 [I.V.:44.7] Out: 476 [Other:476]  CVS- RRR murmurs rubs or gallops RS- CTA diminished at bases ABD- BS present soft non-distended proactive bowel sounds  EXT-chubby appearing lower extremities   Basic Metabolic Panel: Recent Labs  Lab 11/03/17 1559 11/04/17 0458 11/05/17 0351 11/05/17 1600 11/06/17 0416  NA 131* 135 138 137 138  K 4.6 3.5 4.0 4.1 4.3  CL 104 90* 98 101 103  CO2 13* 31 29 25 24   GLUCOSE 167* 103* 94 111* 106*  BUN 85* 62* 33* 27* 22  CREATININE 7.52* 4.56* 2.60* 2.23* 1.93*  CALCIUM 7.4* 6.8* 7.2* 7.1* 7.2*  MG  --  2.4 2.5*  --  2.5*  PHOS 8.6* 5.6* 4.1 4.1 3.7    Liver Function Tests: Recent Labs  Lab 10/31/17 0631 11/03/17 1559 11/04/17 0458 11/05/17 0351 11/05/17 1600  AST 20  --   --   --   --   ALT 21  --   --   --   --   ALKPHOS 132*  --   --   --   --   BILITOT 0.7  --   --   --   --   PROT 6.2*  --   --   --   --   ALBUMIN 2.5* 2.6* 2.6* 2.7* 2.7*   No results for  input(s): LIPASE, AMYLASE in the last 168 hours. No results for input(s): AMMONIA in the last 168 hours.  CBC: Recent Labs  Lab 10/31/17 0631 11/01/17 0904 11/06/17 0416  WBC 7.9 7.7 8.5  HGB 7.4* 7.9* 7.0*  HCT 25.0* 26.9* 25.3*  MCV 67.8* 68.4* 71.9*  PLT 247 241 143*    Cardiac Enzymes: Recent Labs  Lab 11/01/17 0904  CKTOTAL 247*    BNP: Invalid input(s): POCBNP  CBG: Recent Labs  Lab 11/05/17 1200 11/05/17 1625 11/05/17 2200 11/06/17 0725 11/06/17 1136  GLUCAP 105* 103* 94 102* 114*    Microbiology: Results for orders placed or performed during the hospital encounter of 10/30/17  Gastrointestinal Panel by PCR , Stool     Status: None   Collection Time: 10/30/17  9:59 AM  Result Value Ref Range Status   Campylobacter species NOT DETECTED NOT DETECTED Final   Plesimonas shigelloides NOT DETECTED NOT DETECTED Final   Salmonella species NOT DETECTED NOT DETECTED Final   Yersinia enterocolitica NOT DETECTED NOT DETECTED Final   Vibrio species NOT DETECTED NOT DETECTED Final   Vibrio cholerae NOT DETECTED NOT DETECTED  Final   Enteroaggregative E coli (EAEC) NOT DETECTED NOT DETECTED Final   Enteropathogenic E coli (EPEC) NOT DETECTED NOT DETECTED Final   Enterotoxigenic E coli (ETEC) NOT DETECTED NOT DETECTED Final   Shiga like toxin producing E coli (STEC) NOT DETECTED NOT DETECTED Final   Shigella/Enteroinvasive E coli (EIEC) NOT DETECTED NOT DETECTED Final   Cryptosporidium NOT DETECTED NOT DETECTED Final   Cyclospora cayetanensis NOT DETECTED NOT DETECTED Final   Entamoeba histolytica NOT DETECTED NOT DETECTED Final   Giardia lamblia NOT DETECTED NOT DETECTED Final   Adenovirus F40/41 NOT DETECTED NOT DETECTED Final   Astrovirus NOT DETECTED NOT DETECTED Final   Norovirus GI/GII NOT DETECTED NOT DETECTED Final   Rotavirus A NOT DETECTED NOT DETECTED Final   Sapovirus (I, II, IV, and V) NOT DETECTED NOT DETECTED Final    Comment: Performed at [facility], [address] [place], [place] [address]  C difficile quick scan w PCR reflex     Status: Abnormal   Collection Time: 10/30/17  9:59 AM  Result Value Ref Range Status   C Diff antigen POSITIVE (A) NEGATIVE Final   C Diff toxin NEGATIVE NEGATIVE Final   C Diff interpretation Results are indeterminate. See PCR results.  Final    Comment: Performed at [facility], [place] [address] [place], [place] [address]  C. Diff by PCR, Reflexed     Status: Abnormal   Collection Time: 10/30/17  9:59 AM  Result Value Ref Range Status   Toxigenic C. Difficile by PCR POSITIVE (A) NEGATIVE Final    Comment: Positive for toxigenic C. difficile with little to no toxin production. Only treat if clinical presentation suggests symptomatic illness. Performed at [facility], [place] [address] [place], [place] [address]   Urine Culture     Status: None   Collection Time: 11/03/17  1:07 AM  Result Value Ref Range Status   Specimen Description   Final    URINE, CATHETERIZED Performed at [place] [address], [place], [place] [address]    Special Requests NONE  Final   Culture   Final    NO GROWTH Performed at [facility], [place] [address] [place], [place] [address]    Report Status 11/04/2017 FINAL  Final  MRSA PCR Screening     Status: None   Collection Time: 11/03/17 10:49 AM  Result Value Ref Range Status   MRSA by PCR NEGATIVE NEGATIVE Final    Comment:        The GeneXpert MRSA Assay (FDA approved for NASAL specimens only), is one component of a comprehensive MRSA colonization surveillance program. It is not intended to diagnose MRSA infection nor to guide or monitor treatment for MRSA infections. Performed at [facility], [place] [address], [place],  [address]     Coagulation Studies: No results for input(s): LABPROT, INR in the last 72 hours.  Urinalysis: No results for input(s): COLORURINE,  LABSPEC, PHURINE, GLUCOSEU, HGBUR, BILIRUBINUR, KETONESUR, PROTEINUR, UROBILINOGEN, NITRITE, LEUKOCYTESUR in the last 72 hours.  Invalid input(s): APPERANCEUR    Imaging: No results found.   Medications:   .  prismasol BGK 4/2.5 500 mL/hr at 11/06/17 0645  .  prismasol BGK 4/2.5 300 mL/hr at 11/05/17 0310  . sodium chloride 10 mL/hr at 11/06/17 1200  . famotidine (PEPCID) IV Stopped (11/05/17 1831)  . heparin    . prismasol BGK 4/2.5 1,500 mL/hr at 11/06/17 0936   .  sodium chloride   Intravenous Once  . Chlorhexidine Gluconate Cloth  6 each Topical Daily  . furosemide  80 mg Intravenous BID  . insulin aspart  0-15 Units Subcutaneous TID WC  . insulin aspart  0-5 Units Subcutaneous QHS  . mouth rinse  15 mL Mouth Rinse BID  . sodium chloride flush  10-40 mL Intracatheter Q12H   acetaminophen, fentaNYL (SUBLIMAZE) injection, haloperidol lactate, heparin, heparin, ipratropium-albuterol, metoprolol tartrate, sodium chloride flush  Assessment/ Plan:  1. AKIin setting of volume depletion- presumably ischemic ATN, oliguric. Unfortunately her volume status was worsening as well as her metabolic acidosis so she was started on CVVHD on 11/03/17. Electrolytes have improved, however she remains oliguric/anuricdespite IV lasix.  Have discontinued IV Lasix at this time 1. CVVHD with goal UF of 50-129ml/hr as bp tolerates continues to UF fluid.  Continue filtration 2. Replacement fluids and dialysate all prismasate 4K/2.5Ca and is receiving heparin. 2. Volume/HTN- will cont to UF with CVVHD and follow.  Fluid removal with dialysis appears to be doing well heading towards euvolemia 3. Colitis- with diarrhea onIVceftriaxone and flagyl 4. Metabolic acidosis due to #3,OVF overcorrected with CVVHD. Will change replacement fluids to 4K/2.5Ca and follow. 5. Microcytic anemia- transfuse prn 6. DM type 2 7. HTN- off meds due to hypotension and volume depletion. 8. AMS- presumably due to  metabolic encphalopathy and should improve with CVVHD.   LOS: [place] @TODAY @12 :37 PM

## 2017-11-06 NOTE — Consult Note (Addendum)
Consultation Note Date: 11/06/2017   Patient Name: Lauren Crosby  DOB: 03-Feb-1931  MRN: [id number]  Age / Sex: 84 y.o., female  PCP: [name], MD Referring Physician: Tanda Rockers, MD  Reason for Consultation: Establishing goals of care  HPI/Patient Profile: 84 y.o. female  with past medical history of DM, CKD III, HTN, obesity, CHF with chronic leg swelling admitted on 10/30/2017 with colitis with hospital course further complicated by renal failure now on CRRT.  Palliative consulted for goals of care.   Clinical Assessment and Goals of Care: I met today with Patient's husband at the bedside.   I introduced palliative care as specialized medical care for people living with serious illness. It focuses on providing relief from the symptoms and stress of a serious illness. The goal is to improve quality of life for both the patient and the family.  He reports that the most important things are family (has 6 children), faith, and being at their own home.  He does relay concerns that getting more difficult to manage at home.  We discussed clinical course as well as wishes moving forward in regard to advanced directives.  We discussed renal failure and concern that her renal function may not improve.  We had a long talk regarding outcomes including possibility of worsening status or her continuing to be dependent on renal replacement therapy.  He reports that his sister was on dialysis and he is not sure that his wife would find quality of life with dialysis acceptable. We discussed that with her comorid conditions, she may not be a candidate (or may be very poor candidate) for HD.  We discussed difference between a aggressive medical intervention path and a palliative, comfort focused care path, particularly if not a candidate or decide to forgo long term dialysis.  He also reports that she has been clear  that she would never want to be in SNF long term.    Concepts specific to code status and care plan this hospitalization discussed.  He reports that he does not think that his wife would want to be on "life support" such as ventilator.  He believes that he has a copy of living will at home and report needing to review it/discuss with children prior to changing code status.   Concept of Hospice and Palliative Care were discussed.  Questions and concerns addressed.   PMT will continue to support holistically.  SUMMARY OF RECOMMENDATIONS   - Full Code/Full Scope - Husband is considering options and would like to set up follow-up meeting with some of his children present.  He reports that they have prior completed living wills and he will look for it.  While I am going off service, will ask another member of PMT to follow-up with family tomorrow. - Copy of [employer] Cho[employer] for Loving People given to husband.  Code Status/Advance Care Planning:  Full code  Palliative Prophylaxis:   Delirium Protocol, Frequent Pain Assessment and Oral Care  Additional Recommendations (Limitations, Scope, Preferences):  Full  Scope Treatment  Psycho-social/Spiritual:   Desire for further Chaplaincy support:Did not address today  Additional Recommendations: Caregiving  Support/Resources  Prognosis:   Guarded  Discharge Planning: To Be Determined      Primary Diagnoses: Present on Admission: . Diarrhea . Abdominal pain . Acute kidney failure ([place]) . HTN (hypertension) . Hyperlipemia . Hypertension . ATN (acute tubular necrosis) ([place]) . 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 Morbid obesity ([place]) . ARF (acute renal failure) ([place]) . Metabolic acidosis, normal anion gap (NAG)   I have reviewed the medical record, interviewed the patient and family, and examined the patient. The following aspects are pertinent.  Past Medical History:  Diagnosis Date  . Adrenal adenoma   .  Anemia   . Arthritis    OA AND PAIN BOTH KNEES BUT RIGHT KNEE PAIN WORSE; SOME LOWER BACK PAIN  . Cellulitis and abscess of trunk   . Colon polyp    adenomatous  . Diabetes ([place])    type 2  PT IS ON ORAL MEDICATION AND INSULIN  . Diverticulitis   . GERD (gastroesophageal reflux disease)    NO MEDS FOR GERD  . Goiter   . Gout   . Hepatitis    YELLOW JAUNDICE WHEN A CHILD  . Hernia   . Hyperlipemia   . Hypertension   . Nephrolithiasis   . 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 Obesity   . Open wound of abdominal wall, anterior, without mention of complication   . Pneumonia    IN THE PAST  . Retinopathy due to secondary diabetes mellitus ([place])   . Shortness of breath    PT IS NOT ABLE TO BE ACTIVE BECAUSE OF KNEE PROBLEM; HAS ALWAYS HAD PROBLEM WITH BREATHING HEAVY - DOES GET SOB WITH EXERTION.  Marland Kitchen Thalassemia minor    Social History   Socioeconomic History  . Marital status: Married    Spouse name: Not on file  . Number of children: 6  . Years of education: Not on file  . Highest education level: Not on file  Occupational History  . Not on file  Social Needs  . Financial resource strain: Not on file  . Food insecurity:    Worry: Not on file    Inability: Not on file  . Transportation needs:    Medical: Not on file    Non-medical: Not on file  Tobacco Use  . Smoking status: Never Smoker  . Smokeless tobacco: Never Used  Substance and Sexual Activity  . Alcohol use: No    Alcohol/week: 0.0 standard drinks  . Drug use: No  . Sexual activity: Not Currently    Partners: Male    Birth control/protection: Post-menopausal  Lifestyle  . Physical activity:    Days per week: Not on file    Minutes per session: Not on file  . Stress: Not on file  Relationships  . Social connections:    Talks on phone: Not on file    Gets together: Not on file    Attends religious service: Not on file    Active member of club or organization: Not on file     Attends meetings of clubs or organizations: Not on file    Relationship status: Not on file  Other Topics Concern  . Not on file  Social History Narrative  . Not on file   Family History  Problem Relation Age of Onset  . Diabetes Father   . Hypertension Father   .  Kidney disease Father   . Liver disease Mother   . Hypertension Unknown   . Rheum arthritis Sister   . Diverticulitis Sister   . Hypertension Sister   . Colon cancer Neg Hx   . Colon polyps Neg Hx   . Esophageal cancer Neg Hx   . Pancreatic cancer Neg Hx   . Stomach cancer Neg Hx    Scheduled Meds: . Chlorhexidine Gluconate Cloth  6 each Topical Daily  . furosemide  80 mg Intravenous BID  . insulin aspart  0-15 Units Subcutaneous TID WC  . insulin aspart  0-5 Units Subcutaneous QHS  . mouth rinse  15 mL Mouth Rinse BID  . sodium chloride flush  10-40 mL Intracatheter Q12H   Continuous Infusions: .  prismasol BGK 4/2.5 500 mL/hr at 11/05/17 1817  .  prismasol BGK 4/2.5 300 mL/hr at 11/05/17 0310  . sodium chloride Stopped (11/05/17 1428)  . famotidine (PEPCID) IV Stopped (11/05/17 1831)  . heparin    . prismasol BGK 4/2.5 1,500 mL/hr at 11/05/17 2221   PRN Meds:.acetaminophen, fentaNYL (SUBLIMAZE) injection, haloperidol lactate, heparin, heparin, ipratropium-albuterol, metoprolol tartrate, sodium chloride flush Medications Prior to Admission:  Prior to Admission medications   Medication Sig Start Date End Date Taking? Authorizing Provider  allopurinol (ZYLOPRIM) 300 MG tablet Take 300 mg by mouth daily.   Yes [provider]  furosemide (LASIX) 20 MG tablet Take 20 mg by mouth daily.  11/01/16  Yes [provider]  insulin NPH-regular Human (NOVOLIN 70/30) (70-30) 100 UNIT/ML injection 45 units in the am and 50 units at supper   Yes [provider]  metoprolol tartrate (LOPRESSOR) 25 MG tablet Take 1 tablet (25 mg total) by mouth 2 (two) times daily. 06/01/17  Yes Velvet Bathe, MD    Multiple Vitamin (MULTIVITAMIN WITH MINERALS) TABS tablet Take 1 tablet by mouth daily.   Yes [provider]  Multiple Vitamins-Minerals (PRESERVISION AREDS 2+MULTI VIT PO) Take 1 capsule by mouth 2 (two) times daily.    Yes [provider]  simvastatin (ZOCOR) 80 MG tablet Take 80 mg by mouth at bedtime.    Yes [provider]  esomeprazole (NEXIUM) 40 MG capsule Take 40 mg by mouth daily. 01/05/17   [provider]  guaiFENesin-dextromethorphan (ROBITUSSIN DM) 100-10 MG/5ML syrup Take 5 mLs by mouth every 4 (four) hours as needed for cough. Patient not taking: Reported on 10/30/2017 01/14/17   Rosita Fire, MD   Allergies  Allergen Reactions  . Penicillins Other (See Comments)    Has patient had a PCN reaction causing immediate rash, facial/tongue/throat swelling, SOB or lightheadedness with hypotension: No Has patient had a PCN reaction causing severe rash involving mucus membranes or skin necrosis: No Has patient had a PCN reaction that required hospitalization: No Has patient had a PCN reaction occurring within the last 10 years: Unknown If all of the above answers are "NO", then may proceed with Cephalosporin use.   Review of Systems  Physical Exam  General: Alert, awake, occasional moaning.  Follows few commands.  Does not participate in conversation.    HEENT: No bruits, no goiter, no JVD Heart: Regular rate and rhythm. No murmur appreciated. Lungs: Fair air movement, clear Abdomen: Soft, nontender, + distended, positive bowel sounds.  Ext: + edema Skin: Warm and dry  Vital Signs: BP (!) 152/53 (BP Location: Right Arm)   Pulse 84   Temp 97.7 F (36.5 C) (Axillary)   Resp 15   Ht  5' 2"  (1.575 m)   Wt 118.2 kg   SpO2 99%   BMI 47.66 kg/m  Pain Scale: 0-10 POSS *See Group Information*: S-Acceptable,Sleep, easy to arouse Pain Score: Asleep   SpO2: SpO2: 99 % O2 Device:SpO2: 99 % O2 Flow Rate: .O2 Flow Rate (L/min): 2  L/min  IO: Intake/output summary:   Intake/Output Summary (Last 24 hours) at 11/06/2017 0039 Last data filed at 11/05/2017 2300 Gross per 24 hour  Intake 489.7 ml  Output 1768 ml  Net -1278.3 ml    LBM: Last BM Date: 11/04/17 Baseline Weight: Weight: 115.7 kg Most recent weight: Weight: 118.2 kg     Palliative Assessment/Data:     Time In: 1840 Time Out: 2000 Time Total: 80 Greater than 50%  of this time was spent counseling and coordinating care related to the above assessment and plan.  Signed by: Micheline Rough, MD   Please contact Palliative Medicine Team phone at [phone] for questions and concerns.  For individual provider: See Shea Evans

## 2017-11-07 ENCOUNTER — Inpatient Hospital Stay (HOSPITAL_COMMUNITY): Payer: Medicare HMO

## 2017-11-07 DIAGNOSIS — G9341 Metabolic encephalopathy: Secondary | ICD-10-CM

## 2017-11-07 DIAGNOSIS — Z515 Encounter for palliative care: Secondary | ICD-10-CM

## 2017-11-07 DIAGNOSIS — J810 Acute pulmonary edema: Secondary | ICD-10-CM

## 2017-11-07 DIAGNOSIS — J9601 Acute respiratory failure with hypoxia: Secondary | ICD-10-CM

## 2017-11-07 LAB — CBC
HEMATOCRIT: 26.4 % — AB (ref 36.0–46.0)
Hemoglobin: 7.5 g/dL — ABNORMAL LOW (ref 12.0–15.0)
MCH: 21.4 pg — ABNORMAL LOW (ref 26.0–34.0)
MCHC: 28.4 g/dL — ABNORMAL LOW (ref 30.0–36.0)
MCV: 75.2 fL — AB (ref 80.0–100.0)
NRBC: 0.8 % — AB (ref 0.0–0.2)
Platelets: 131 10*3/uL — ABNORMAL LOW (ref 150–400)
RBC: 3.51 MIL/uL — ABNORMAL LOW (ref 3.87–5.11)
RDW: 22.5 % — ABNORMAL HIGH (ref 11.5–15.5)
WBC: 6.6 10*3/uL (ref 4.0–10.5)

## 2017-11-07 LAB — BASIC METABOLIC PANEL
ANION GAP: 9 (ref 5–15)
BUN: 16 mg/dL (ref 8–23)
CALCIUM: 7.3 mg/dL — AB (ref 8.9–10.3)
CO2: 23 mmol/L (ref 22–32)
Chloride: 105 mmol/L (ref 98–111)
Creatinine, Ser: 1.7 mg/dL — ABNORMAL HIGH (ref 0.44–1.00)
GFR calc non Af Amer: 26 mL/min — ABNORMAL LOW (ref >60)
GFR, EST AFRICAN AMERICAN: 31 mL/min — AB (ref >60)
GLUCOSE: 106 mg/dL — AB (ref 70–99)
POTASSIUM: 4.2 mmol/L (ref 3.5–5.1)
Sodium: 137 mmol/L (ref 135–145)

## 2017-11-07 LAB — RENAL FUNCTION PANEL
ALBUMIN: 2.7 g/dL — AB (ref 3.5–5.0)
ANION GAP: 10 (ref 5–15)
BUN: 14 mg/dL (ref 8–23)
CO2: 22 mmol/L (ref 22–32)
Calcium: 7.2 mg/dL — ABNORMAL LOW (ref 8.9–10.3)
Chloride: 106 mmol/L (ref 98–111)
Creatinine, Ser: 1.62 mg/dL — ABNORMAL HIGH (ref 0.44–1.00)
GFR calc Af Amer: 33 mL/min — ABNORMAL LOW (ref >60)
GFR, EST NON AFRICAN AMERICAN: 28 mL/min — AB (ref >60)
GLUCOSE: 118 mg/dL — AB (ref 70–99)
PHOSPHORUS: 3.1 mg/dL (ref 2.5–4.6)
POTASSIUM: 4.1 mmol/L (ref 3.5–5.1)
Sodium: 138 mmol/L (ref 135–145)

## 2017-11-07 LAB — TYPE AND SCREEN
ABO/RH(D): A POS
Antibody Screen: NEGATIVE
Unit division: 0

## 2017-11-07 LAB — BPAM RBC
BLOOD PRODUCT EXPIRATION DATE: 201911242359
ISSUE DATE / TIME: 201910291143
Unit Type and Rh: 6200

## 2017-11-07 LAB — GLUCOSE, CAPILLARY
GLUCOSE-CAPILLARY: 134 mg/dL — AB (ref 70–99)
Glucose-Capillary: 92 mg/dL (ref 70–99)
Glucose-Capillary: 124 mg/dL — ABNORMAL HIGH (ref 70–99)
Glucose-Capillary: 119 mg/dL — ABNORMAL HIGH (ref 70–99)

## 2017-11-07 LAB — PHOSPHORUS: Phosphorus: 3.1 mg/dL (ref 2.5–4.6)

## 2017-11-07 LAB — MAGNESIUM: Magnesium: 2.7 mg/dL — ABNORMAL HIGH (ref 1.7–2.4)

## 2017-11-07 MED ORDER — INSULIN ASPART 100 UNIT/ML ~~LOC~~ SOLN
0.0000 [IU] | SUBCUTANEOUS | Status: DC
  Administered 2017-11-08: 0 [IU] via SUBCUTANEOUS
  Administered 2017-11-08 – 2017-11-09 (×5): 2 [IU] via SUBCUTANEOUS

## 2017-11-07 MED ORDER — VITAL HIGH PROTEIN PO LIQD
1000.0000 mL | ORAL | Status: DC
  Administered 2017-11-07: 1000 mL

## 2017-11-07 MED ORDER — PRO-STAT SUGAR FREE PO LIQD
30.0000 mL | Freq: Two times a day (BID) | ORAL | Status: DC
  Administered 2017-11-08: 30 mL

## 2017-11-07 NOTE — Progress Notes (Addendum)
PULMONARY / CRITICAL CARE MEDICINE   NAME:  Lauren Crosby, MRN:  [id number], DOB:  [birth year], LOS: 7 ADMISSION DATE:  10/30/2017, CONSULTATION DATE:  11/03/17 REFERRING MD:  [facility]/ renal  CHIEF COMPLAINT:  Acidosis/ renal failure/ delirium   BRIEF HISTORY:    86 yowf morbid obesity  Complicated byh DM/ CRI and hbp adm 10/30/17 with diarrhea and worsening renal insuff  With dx of prob c diff colitis/ met acidosis and worsening mental status am 10/26 so transferred to ICU for   HD catheter placement / initiation of cvvh and pccm service consulted.     SIGNIFICANT EVENTS:  started CVVH  Pm 10/26 10/29: Goals of care discussed, no CODE STATUS/partial code agreed-upon 10/30: Remains on CRRT.  About 2 L positive remarkably deconditioned, malnourished, and remains encephalopathic.  Starting tube feeds.  STUDIES:     CULTURES:  C diff 10/22 pos pcr/ pos antigen neg for toxin UC 10/26 >>> neg  ANTIBIOTICS:  Vanc po 10/22 > 10/23 Flagyl 10/22>10/28 Rocephin 10/22 >10/28  LINES/TUBES:   HD catheter per IR  R SCVein  10/26  CONSULTANTS:  Renal  10/24   SUBJECTIVE:   No distress but "hurts all over"  CONSTITUTIONAL: Blood Pressure (Abnormal) 151/34   Pulse 88   Temperature 97.9 F (36.6 C) (Axillary)   Respiration 20   Height _0  (1.575 m)   Weight 116.9 kg   Oxygen Saturation 99%   Body Mass Index 47.14 kg/m   On 2lpm NP  I/O last 3 completed shifts: In: 845.2 [I.V.:198.2; Blood:330; Other:167; IV Piggyback:150] Out: 3705 [Urine:50; Other:3655]   PHYSICAL EXAM:  General: This is a severely debilitated encephalopathic 83 year old female lying in bed she is currently in no acute distress HEENT: Normocephalic atraumatic mucous membranes are dry no jugular venous distention is Pulmonary: Diminished throughout no accessory use Cardiac: Regular rate and rhythm Abdomen: Soft, obese, no organomegaly positive bowel sounds Extremities: Generalized edema chronic venous  stasis changes strong pulses warm Neuro: Awake, speech slurred, generalized weakness, oriented times self GU: Minimal urine output  RESOLVED PROBLEM LIST  Metabolic acidosis (both NAG and AG)-->resolved as of 10/29 after starting CRRT Possible sepsis PMC vs gastroenteritis (completed course ctx and flagyl) ASSESSMENT AND PLAN     AKI in setting of volume depletion and sepsis Plan CRRT per nephrology  Serial chemistries  Strict I&O  Intermittent Fluid and electrolyte imbalance  Plan Trend chemistry and replace as indicated   Acute metabolic encephalopathy -No focal deficits however she is still confused.  She is improving however Plan Cont supportive care   DM Excellent glycemic control Plan Cont SSI  Anemia -received 1 unit PRBC 10/29 Plan Trend CBC Transfuse for hgb < 7  Severe Protein Calorie malnutrition Plan Needs small bore feeding tube Start tube feeds.   Severe deconditioning Plan Needs PT/OT   SUMMARY OF TODAY'S PLAN:  Remains critically ill due to her profound encephalopathy and acute kidney injury she is still volume overloaded.  She is no longer in shock.  Would continue CRRT, continue supportive care, still not convinced she can recover from this illness   Best Practice / Goals of Care / Disposition.   DVT PROPHYLAXIS: PAS SUP: Pepcid IV NUTRITION: none  for now MOBILITY:bedrest for now GOALS OF CARE: DNR  FAMILY DISCUSSIONS: goals of care discussed 10/29 DISPOSITION  Hopefully back to baseline / husband very attentive at bedside but hard of hearing and not sure he always gets the picture   LABS  Glucose Recent Labs  Lab 11/05/17 2200 11/06/17 0725 11/06/17 1136 11/06/17 1654 11/06/17 2131 11/07/17 0740  GLUCAP 94 102* 114* 121* 115* 92    BMET Recent Labs  Lab 11/06/17 0416 11/06/17 1827 11/07/17 0533  NA 138 139 137  K 4.3 4.2 4.2  CL 103 105 105  CO2 24 21* 23  BUN _0 CREATININE 1.93* 1.72* 1.70*  GLUCOSE 106*  119* 106*    Liver Enzymes Recent Labs  Lab 11/05/17 0351 11/05/17 1600 11/06/17 1827  ALBUMIN 2.7* 2.7* 2.6*    Electrolytes Recent Labs  Lab 11/05/17 0351  11/06/17 0416 11/06/17 1827 11/07/17 0533  CALCIUM 7.2*   < > 7.2* 7.0* 7.3*  MG 2.5*  --  2.5*  --  2.7*  PHOS 4.1   < > 3.7 3.8 3.1   < > = values in this interval not displayed.    CBC Recent Labs  Lab 11/01/17 0904 11/06/17 0416 11/06/17 1827 11/07/17 0533  WBC 7.7 8.5  --  6.6  HGB 7.9* 7.0* 7.4* 7.5*  HCT 26.9* 25.3* 26.4* 26.4*  PLT 241 143*  --  131*    ABG No results for input(s): PHART, PCO2ART, PO2ART in the last 168 hours.  Coag's No results for input(s): APTT, INR in the last 168 hours.  Sepsis Markers No results for input(s): LATICACIDVEN, PROCALCITON, O2SATVEN in the last 168 hours.  Cardiac Enzymes No results for input(s): TROPONINI, PROBNP in the last 168 hours.   Erick Colace ACNP-BC [place] Pager # [phone] OR # [phone] if no answer  Attending Note:  84 year old female with an extensive PMH who presents to PCCM with c diff colitis and worsening renal failure that resulted in her needing CRRT for fluid removal.  On exam, patient is noted to be confused but clearing her airway for now, is actually able to communicate some today.  Discussed with PCCM-NP.  Will insert NGT.  Ask nutrition to start TF.  Hold off abx.  Remove IVF as able.  Further communication with husband confirmed code status.  Appreciate input from nephrology and palliative care.  Will continue medical management at this point, may use pressors if necessary but no intubation/CPR/cardioversion.  PCCM will continue to follow.  The patient is critically ill with multiple organ systems failure and requires high complexity decision making for assessment and support, frequent evaluation and titration of therapies, application of advanced monitoring technologies and extensive interpretation of  multiple databases.   Critical Care Time devoted to patient care services described in this note is  32  Minutes. This time reflects time of care of this signee Dr Jennet Maduro. This critical care time does not reflect procedure time, or teaching time or supervisory time of PA/NP/Med student/Med Resident etc but could involve care discussion time.  Rush Farmer, M.D. [facility] Pulmonary/Critical Care Medicine. Pager: [phone]. After hours pager: [phone].

## 2017-11-07 NOTE — Progress Notes (Signed)
[facility] ROUNDING NOTE   Subjective:   Continues on CRRT still having diffuse pain.  Very little urine output  Blood pressure 124/32 pulse 85 temperature 98.3    Sodium 137 potassium 4.2 chloride 105 CO2 23 BUN 16 creatinine 1.7 calcium 7.3 phosphorus 3.1 magnesium 2.7 albumin 2.6 WBC 6.6 hemoglobin 7.5 platelets 131      Objective:  Vital signs in last 24 hours:  Temp:  [97.5 F (36.4 C)-98.3 F (36.8 C)] 98.3 F (36.8 C) (10/30 1200) Pulse Rate:  [78-88] 85 (10/30 1300) Resp:  [13-23] 17 (10/30 1300) BP: (119-151)/(19-95) 124/32 (10/30 1300) SpO2:  [96 %-100 %] 96 % (10/30 1300) Weight:  [116.9 kg] 116.9 kg (10/30 0800)  Weight change:  Filed Weights   11/05/17 0341 11/06/17 0400 11/07/17 0800  Weight: 118.2 kg 119.4 kg 116.9 kg    Intake/Output: I/O last 3 completed shifts: In: 845.2 [I.V.:198.2; Blood:330; Other:167; IV Piggyback:150] Out: 3705 [Urine:50; ERDEY:8144]   Intake/Output this shift:  Total I/O In: 0  Out: 574 [Urine:30; Other:544]  CVS- RRR murmurs rubs or gallops RS- CTA diminished at bases ABD- BS present soft non-distended proactive bowel sounds  EXT-chubby appearing lower extremities   Basic Metabolic Panel: Recent Labs  Lab 11/04/17 0458 11/05/17 0351 11/05/17 1600 11/06/17 0416 11/06/17 1827 11/07/17 0533  NA 135 138 137 138 139 137  K 3.5 4.0 4.1 4.3 4.2 4.2  CL 90* 98 101 103 105 105  CO2 31 29 25 24  21* 23  GLUCOSE 103* 94 111* 106* 119* 106*  BUN 62* 33* 27* 22 19 16   CREATININE 4.56* 2.60* 2.23* 1.93* 1.72* 1.70*  CALCIUM 6.8* 7.2* 7.1* 7.2* 7.0* 7.3*  MG 2.4 2.5*  --  2.5*  --  2.7*  PHOS 5.6* 4.1 4.1 3.7 3.8 3.1    Liver Function Tests: Recent Labs  Lab 11/03/17 1559 11/04/17 0458 11/05/17 0351 11/05/17 1600 11/06/17 1827  ALBUMIN 2.6* 2.6* 2.7* 2.7* 2.6*   No results for input(s): LIPASE, AMYLASE in the last 168 hours. No results for input(s): AMMONIA in the last 168 hours.  CBC: Recent  Labs  Lab 11/01/17 0904 11/06/17 0416 11/06/17 1827 11/07/17 0533  WBC 7.7 8.5  --  6.6  HGB 7.9* 7.0* 7.4* 7.5*  HCT 26.9* 25.3* 26.4* 26.4*  MCV 68.4* 71.9*  --  75.2*  PLT 241 143*  --  131*    Cardiac Enzymes: Recent Labs  Lab 11/01/17 0904  CKTOTAL 247*    BNP: Invalid input(s): POCBNP  CBG: Recent Labs  Lab 11/06/17 1136 11/06/17 1654 11/06/17 2131 11/07/17 0740 11/07/17 1207  GLUCAP 114* 121* 115* 92 124*    Microbiology: Results for orders placed or performed during the hospital encounter of 10/30/17  Gastrointestinal Panel by PCR , Stool     Status: None   Collection Time: 10/30/17  9:59 AM  Result Value Ref Range Status   Campylobacter species NOT DETECTED NOT DETECTED Final   Plesimonas shigelloides NOT DETECTED NOT DETECTED Final   Salmonella species NOT DETECTED NOT DETECTED Final   Yersinia enterocolitica NOT DETECTED NOT DETECTED Final   Vibrio species NOT DETECTED NOT DETECTED Final   Vibrio cholerae NOT DETECTED NOT DETECTED Final   Enteroaggregative E coli (EAEC) NOT DETECTED NOT DETECTED Final   Enteropathogenic E coli (EPEC) NOT DETECTED NOT DETECTED Final   Enterotoxigenic E coli (ETEC) NOT DETECTED NOT DETECTED Final   Shiga like toxin producing E coli (STEC) NOT DETECTED NOT DETECTED Final  Shigella/Enteroinvasive E coli (EIEC) NOT DETECTED NOT DETECTED Final   Cryptosporidium NOT DETECTED NOT DETECTED Final   Cyclospora cayetanensis NOT DETECTED NOT DETECTED Final   Entamoeba histolytica NOT DETECTED NOT DETECTED Final   Giardia lamblia NOT DETECTED NOT DETECTED Final   Adenovirus F40/41 NOT DETECTED NOT DETECTED Final   Astrovirus NOT DETECTED NOT DETECTED Final   Norovirus GI/GII NOT DETECTED NOT DETECTED Final   Rotavirus A NOT DETECTED NOT DETECTED Final   Sapovirus (I, II, IV, and V) NOT DETECTED NOT DETECTED Final    Comment: Performed at [facility], [address] [place], [place] [address]  C difficile quick  scan w PCR reflex     Status: Abnormal   Collection Time: 10/30/17  9:59 AM  Result Value Ref Range Status   C Diff antigen POSITIVE (A) NEGATIVE Final   C Diff toxin NEGATIVE NEGATIVE Final   C Diff interpretation Results are indeterminate. See PCR results.  Final    Comment: Performed at [facility], [place] [address] [place], [place] [address]  C. Diff by PCR, Reflexed     Status: Abnormal   Collection Time: 10/30/17  9:59 AM  Result Value Ref Range Status   Toxigenic C. Difficile by PCR POSITIVE (A) NEGATIVE Final    Comment: Positive for toxigenic C. difficile with little to no toxin production. Only treat if clinical presentation suggests symptomatic illness. Performed at [facility], [place] [address], [place], [place] [address]   Urine Culture     Status: None   Collection Time: 11/03/17  1:07 AM  Result Value Ref Range Status   Specimen Description   Final    URINE, CATHETERIZED Performed at [place] [address], [place], [place] [address]    Special Requests NONE  Final   Culture   Final    NO GROWTH Performed at [facility], [place] [address], [place], [place] [address]    Report Status 11/04/2017 FINAL  Final  MRSA PCR Screening     Status: None   Collection Time: 11/03/17 10:49 AM  Result Value Ref Range Status   MRSA by PCR NEGATIVE NEGATIVE Final    Comment:        The GeneXpert MRSA Assay (FDA approved for NASAL specimens only), is one component of a comprehensive MRSA colonization surveillance program. It is not intended to diagnose MRSA infection nor to guide or monitor treatment for MRSA infections. Performed at [facility], [place] [address], [place],  [address]     Coagulation Studies: No results for input(s): LABPROT, INR in the last 72 hours.  Urinalysis: No results for input(s): COLORURINE, LABSPEC, PHURINE, GLUCOSEU, HGBUR, BILIRUBINUR, KETONESUR, PROTEINUR,  UROBILINOGEN, NITRITE, LEUKOCYTESUR in the last 72 hours.  Invalid input(s): APPERANCEUR    Imaging: Dg Chest Port 1 View  Result Date: 11/07/2017 CLINICAL DATA:  Acute respiratory failure with hypoxia. EXAM: PORTABLE CHEST 1 VIEW COMPARISON:  10/31/2017. FINDINGS: Cardiomegaly. Improved lung volumes. Increased perihilar markings could represent early edema or viral pneumonitis. Other than improved lung markings, similar appearance to priors. Dialysis catheter has been inserted via RIGHT IJ approach. Tip slide in the RIGHT atrium. There is no pneumothorax. IMPRESSION: Satisfactory catheter placement. No significant change in the appearance of the lungs except for improved volume. Cardiomegaly. Electronically Signed   By: Staci Righter M.D.   On: 11/07/2017 11:30   Dg Abd Portable 1v  Result Date: 11/07/2017 CLINICAL DATA:  Nasogastric tube placement. EXAM: PORTABLE ABDOMEN - 1 VIEW COMPARISON:  11/03/2017 FINDINGS: Interval nasogastric tube folded back upon itself in the mid to distal stomach with its tip in the mid stomach. The included bowel gas pattern is normal. Prominent interstitial markings at the left lung base. Lower thoracic spine degenerative changes. IMPRESSION: Nasogastric tube folded back upon itself in the mid to distal stomach with its tip in the mid stomach. Electronically Signed   By: Claudie Revering M.D.   On: 11/07/2017 13:13     Medications:   .  prismasol BGK 4/2.5 500 mL/hr at 11/07/17 1309  .  prismasol BGK 4/2.5 300 mL/hr at 11/07/17 1000  . sodium chloride Stopped (11/07/17 0800)  . famotidine (PEPCID) IV Stopped (11/06/17 1904)  . heparin 999 mL/hr at 11/07/17 1000  . prismasol BGK 4/2.5 1,500 mL/hr at 11/07/17 1308   . sodium chloride   Intravenous Once  . Chlorhexidine Gluconate Cloth  6 each Topical Daily  . insulin aspart  0-15 Units Subcutaneous TID WC  . insulin aspart  0-5 Units Subcutaneous QHS  . mouth rinse  15 mL Mouth Rinse BID  . sodium chloride  flush  10-40 mL Intracatheter Q12H   acetaminophen, fentaNYL (SUBLIMAZE) injection, haloperidol lactate, heparin, heparin, ipratropium-albuterol, metoprolol tartrate, sodium chloride flush  Assessment/ Plan:   1. AKIin setting of volume depletion- presumably ischemic ATN, oliguric. Unfortunately her volume status was worsening as well as her metabolic acidosis so she was started on CVVHD on 11/03/17. Electrolytes have improved, however she remains oliguric/anuricdespite IV lasix.Have discontinued IV Lasix  1. CVVHD with goal UF of 50-196ml/hr as bp toleratescontinues to UF fluid.  Continue filtration weight continues to decrease with great fluid removal 2. Replacement fluids and dialysate all prismasate 4K/2.5Ca and is receiving heparin.  Slight drop in platelet count we will continue to follow 2. Volume/HTN- will cont to UF with CVVHD and follow. Fluid removal with dialysis appears to be doing well heading towards euvolemia 3. Colitis- with diarrhea onIVceftriaxone and flagyl 4. Metabolic acidosis due to #1,YWV overcorrected with CVVHD. Will change replacement fluids to 4K/2.5Ca and follow. 5. Microcytic anemia- transfuse prn 6. DM type 2 7. HTN- off meds due to hypotension and volume depletion. 8. AMS-appears to be slightly improved but continued with pain   LOS: [place] @TODAY @1 :49 PM

## 2017-11-07 NOTE — Progress Notes (Signed)
Brief Nutrition Note  Consult received for enteral/tube feeding initiation and management. RD paged via RN requesting tube feeding initiation. NGT has been placed.   Adult Enteral Nutrition Protocol initiated. Full assessment to follow.  Admitting Dx: Tachypnea [R06.82] Diarrhea, unspecified type [R19.7]  Body mass index is 47.14 kg/m. Pt meets criteria for morbid obesity based on current BMI.  Labs:  Recent Labs  Lab 11/05/17 0351  11/06/17 0416 11/06/17 1827 11/07/17 0533  NA 138   < > 138 139 137  K 4.0   < > 4.3 4.2 4.2  CL 98   < > 103 105 105  CO2 29   < > 24 21* 23  BUN 33*   < > 22 19 16   CREATININE 2.60*   < > 1.93* 1.72* 1.70*  CALCIUM 7.2*   < > 7.2* 7.0* 7.3*  MG 2.5*  --  2.5*  --  2.7*  PHOS 4.1   < > 3.7 3.8 3.1  GLUCOSE 94   < > 106* 119* 106*   < > = values in this interval not displayed.    Corrin Parker, MS, RD, LDN Pager # [phone] After hours/ weekend pager # [phone]

## 2017-11-07 NOTE — Progress Notes (Signed)
Daily Progress Note   Patient Name: Lauren Crosby       Date: 11/07/2017 DOB: [birth year]  Age: 84 y.o. MRN#: [id number] Attending Physician: Tanda Rockers, MD Primary Care Physician: [name], MD Admit Date: 10/30/2017  Reason for Consultation/Follow-up: Establishing goals of care  Subjective:  patient is more alert, she is able to have a focused meaningful days. She responds appropriately to questions asked by her husband,daughter present at the bedside.  See below  Length of Stay: 7  Current Medications: Scheduled Meds:  . sodium chloride   Intravenous Once  . Chlorhexidine Gluconate Cloth  6 each Topical Daily  . insulin aspart  0-15 Units Subcutaneous TID WC  . insulin aspart  0-5 Units Subcutaneous QHS  . mouth rinse  15 mL Mouth Rinse BID  . sodium chloride flush  10-40 mL Intracatheter Q12H    Continuous Infusions: .  prismasol BGK 4/2.5 500 mL/hr at 11/07/17 1309  .  prismasol BGK 4/2.5 300 mL/hr at 11/07/17 1000  . sodium chloride Stopped (11/07/17 0800)  . famotidine (PEPCID) IV Stopped (11/06/17 1904)  . heparin 999 mL/hr at 11/07/17 1000  . prismasol BGK 4/2.5 1,500 mL/hr at 11/07/17 1308    PRN Meds: acetaminophen, fentaNYL (SUBLIMAZE) injection, haloperidol lactate, heparin, heparin, ipratropium-albuterol, metoprolol tartrate, sodium chloride flush  Physical Exam         Week lady confused appearing Severely debilitated Diminished breath sounds S1 and S2 Abdomen is soft non tender Generalized edema At times, she is answering very few questions appropriately Vital Signs: BP (!) 127/40   Pulse 87   Temp 98.3 F (36.8 C) (Axillary)   Resp (!) 23   Ht 5\' 2"  (1.575 m)   Wt 116.9 kg   SpO2 99%   BMI 47.14 kg/m  SpO2: SpO2: 99 % O2 Device:  O2 Device: Nasal Cannula O2 Flow Rate: O2 Flow Rate (L/min): 2 L/min  Intake/output summary:   Intake/Output Summary (Last 24 hours) at 11/07/2017 1327 Last data filed at 11/07/2017 1300 Gross per 24 hour  Intake 503.49 ml  Output 2657 ml  Net -2153.51 ml   LBM: Last BM Date: 11/05/17 Baseline Weight: Weight: 115.7 kg Most recent weight: Weight: 116.9 kg       Palliative Assessment/Data: Palliative performance scale 20%     Patient  Active Problem List   Diagnosis Date Noted  . Acute respiratory failure with hypoxemia ([place])   . Acute pulmonary edema (HCC)   . Acute encephalopathy   . Goals of care, counseling/discussion   . Hyperglycemia due to type 2 diabetes mellitus ([place]) 11/04/2017  . Encephalopathy acute 11/04/2017  . Shock circulatory ([place]) 11/03/2017  . Colitis, acute 10/31/2017  . ATN (acute tubular necrosis) ([place]) 10/31/2017  . Diarrhea 10/30/2017  . CKD (chronic kidney disease) stage 3, GFR 30-59 ml/min (HCC) 10/30/2017  . Pyelitis: Mild per CT 05/24/2017 05/25/2017  . Acidemia   . Abdominal pain 05/24/2017  . Vaginal candidiasis 05/24/2017  . Dermatitis associated with moisture 05/24/2017  . Ileus [facility]): Colonic 05/24/2017  . Acute lower UTI 05/23/2017  . Iron deficiency anemia 05/23/2017  . Pressure injury of buttock, stage 2 ([place]) 05/23/2017  . Acute on chronic anemia   . Hyperkalemia   . Hydronephrosis   . Acute kidney failure ([place]) 05/22/2017  . Metabolic acidosis, normal anion gap (NAG) 05/22/2017  . Hyperkalemia, diminished renal excretion 05/22/2017  . Rhinovirus infection   . AKI (acute kidney injury) ([place])   . HCAP (healthcare-associated pneumonia) 01/12/2017  . ARF (acute renal failure) ([place]) 01/12/2017  . Shortness of breath   . Retinopathy due to secondary diabetes mellitus ([place])   . Pneumonia   . Obesity   . 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 Nephrolithiasis   . Hypertension   . Hyperlipemia   . Hepatitis   .  Gout   . Goiter   . GERD (gastroesophageal reflux disease)   . Diverticulitis   . Diabetes ([place])   . Colon polyp   . Cellulitis and abscess of trunk   . Arthritis   . Chronic anemia   . Adrenal adenoma   . Morbid obesity ([place]) 08/13/2013  . Postoperative anemia due to acute blood loss 08/13/2013  . S/P right TKA 08/11/2013  . Preop cardiovascular exam 08/14/2012  . Chest pain 08/14/2012  . Suture granuloma 02/09/2012  . DYSPNEA ON EXERTION 10/04/2009  . HYPERLIPIDEMIA 04/15/2007  . OBESITY 04/15/2007  . ANEMIA, CHRONIC 04/15/2007  . ANXIETY 04/15/2007  . DEPRESSION 04/15/2007  . HTN (hypertension) 04/15/2007  . GERD 04/15/2007  . DEGENERATIVE JOINT DISEASE 04/15/2007  . HELICOBACTER PYLORI INFECTION, HX OF 04/15/2007  . COLONIC POLYPS 10/04/2006  . DIVERTICULOSIS, COLON 10/04/2006  . REFLUX ESOPHAGITIS April 22, 202003  . GASTRITIS, ACUTE April 22, 202003    Palliative Care Assessment & Plan   Patient Profile:    Assessment:  84 year old lady who lives at home with her husband, has 6 children, she has been admitted with diarrhea, renal insufficiency, probable C. Difficile colitis, metabolic acidosis. She has underlying diabetes, morbid obesity and chronic renal insufficiency. Hospital course complicated by worsening metabolic acidosis, worsening mental status, worsening renal insufficiency to the point of requiring CRRT. Patient remains in stepdown unit at [facility], partial code.  palliative care following for ongoing goals of care discussions  Recommendations/Plan:  Goals of care discussions: family meeting with husband and daughter present at the bedside. Patient participated some. For the most part, she is severely deconditioned, appears chronically ill, is and not able to interact meaningfully. Patient's husband is thankful for the information provided. He is well aware of the serious nature of the patient's illness. Patient's daughter states that she wants to  continue efforts at recovery/improvement. Discussed frankly and honestly about the patient's severe deconditioning, acute kidney injury, acute metabolic encephalopathy, recent C.  Difficile colitis.   NG tube to be placed, tube feedings to be commenced from today. Patient remains at high risk for ongoing decline and decompensation. Worry about whether or not the patient will be able to survive this hospitalization. I have discussed this gently and compassionately with husband and daughter present at the bedside. Patient's daughter is tearful towards the end of our conversation but realizes that the patient is up against a lot of things.     Code Status:    Code Status Orders  (From admission, onward)         Start     Ordered   11/06/17 0957  Limited resuscitation (code)  Continuous    Question Answer Comment  In the event of cardiac or respiratory ARREST: Initiate Code Blue, Call Rapid Response Yes   In the event of cardiac or respiratory ARREST: Perform CPR No   In the event of cardiac or respiratory ARREST: Perform Intubation/Mechanical Ventilation No   In the event of cardiac or respiratory ARREST: Use NIPPV/BiPAp only if indicated Yes   In the event of cardiac or respiratory ARREST: Administer ACLS medications if indicated Yes   In the event of cardiac or respiratory ARREST: Perform Defibrillation or Cardioversion if indicated No      11/06/17 0956        Code Status History    Date Active Date Inactive Code Status Order ID Comments User Context   10/30/2017 1415 11/06/2017 0956 Full Code [id number]  Caren Griffins, MD ED   05/22/2017 2250 06/01/2017 1900 Full Code [id number]  Etta Quill, DO ED   01/13/2017 0250 01/15/2017 1622 Full Code [id number]  Jani Gravel, MD ED   08/11/2013 1955 08/13/2013 1559 Full Code [id number]  Babish, Lucille Passy, PA-C Inpatient       Prognosis:   Unable to determine Prognosis remains guarded Discharge Planning:  to be determined.   Care  plan was discussed with  Patient, husband, daughter Lattie Haw present at the bedside  Thank you for allowing the Palliative Medicine Team to assist in the care of this patient.   Time In:  10 Time Out: 10.35 Total Time 35 Prolonged Time Billed  no       Greater than 50%  of this time was spent counseling and coordinating care related to the above assessment and plan.  Loistine Chance, MD [phone]  Please contact Palliative Medicine Team phone at [phone] for questions and concerns.

## 2017-11-08 ENCOUNTER — Inpatient Hospital Stay (HOSPITAL_COMMUNITY): Payer: Medicare HMO

## 2017-11-08 DIAGNOSIS — Z515 Encounter for palliative care: Secondary | ICD-10-CM

## 2017-11-08 LAB — CBC
HCT: 26.3 % — ABNORMAL LOW (ref 36.0–46.0)
Hemoglobin: 7.4 g/dL — ABNORMAL LOW (ref 12.0–15.0)
MCH: 21.4 pg — AB (ref 26.0–34.0)
MCHC: 28.1 g/dL — ABNORMAL LOW (ref 30.0–36.0)
MCV: 76.2 fL — ABNORMAL LOW (ref 80.0–100.0)
PLATELETS: 117 10*3/uL — AB (ref 150–400)
RBC: 3.45 MIL/uL — AB (ref 3.87–5.11)
RDW: 23.2 % — AB (ref 11.5–15.5)
WBC: 7.4 10*3/uL (ref 4.0–10.5)
nRBC: 0.7 % — ABNORMAL HIGH (ref 0.0–0.2)

## 2017-11-08 LAB — GLUCOSE, CAPILLARY
GLUCOSE-CAPILLARY: 168 mg/dL — AB (ref 70–99)
GLUCOSE-CAPILLARY: 177 mg/dL — AB (ref 70–99)
Glucose-Capillary: 147 mg/dL — ABNORMAL HIGH (ref 70–99)
Glucose-Capillary: 153 mg/dL — ABNORMAL HIGH (ref 70–99)
Glucose-Capillary: 194 mg/dL — ABNORMAL HIGH (ref 70–99)
Glucose-Capillary: 207 mg/dL — ABNORMAL HIGH (ref 70–99)
Glucose-Capillary: 236 mg/dL — ABNORMAL HIGH (ref 70–99)

## 2017-11-08 LAB — RENAL FUNCTION PANEL
ALBUMIN: 2.6 g/dL — AB (ref 3.5–5.0)
ANION GAP: 12 (ref 5–15)
Albumin: 2.6 g/dL — ABNORMAL LOW (ref 3.5–5.0)
Anion gap: 9 (ref 5–15)
BUN: 13 mg/dL (ref 8–23)
BUN: 15 mg/dL (ref 8–23)
CALCIUM: 7.6 mg/dL — AB (ref 8.9–10.3)
CO2: 22 mmol/L (ref 22–32)
CO2: 23 mmol/L (ref 22–32)
CREATININE: 1.53 mg/dL — AB (ref 0.44–1.00)
Calcium: 7.7 mg/dL — ABNORMAL LOW (ref 8.9–10.3)
Chloride: 104 mmol/L (ref 98–111)
Chloride: 107 mmol/L (ref 98–111)
Creatinine, Ser: 1.55 mg/dL — ABNORMAL HIGH (ref 0.44–1.00)
GFR calc Af Amer: 34 mL/min — ABNORMAL LOW (ref >60)
GFR calc non Af Amer: 30 mL/min — ABNORMAL LOW (ref >60)
GFR calc non Af Amer: 30 mL/min — ABNORMAL LOW (ref >60)
GFR, EST AFRICAN AMERICAN: 35 mL/min — AB (ref >60)
Glucose, Bld: 179 mg/dL — ABNORMAL HIGH (ref 70–99)
Glucose, Bld: 212 mg/dL — ABNORMAL HIGH (ref 70–99)
PHOSPHORUS: 3 mg/dL (ref 2.5–4.6)
POTASSIUM: 4.3 mmol/L (ref 3.5–5.1)
Phosphorus: 2.5 mg/dL (ref 2.5–4.6)
Potassium: 4.1 mmol/L (ref 3.5–5.1)
SODIUM: 139 mmol/L (ref 135–145)
Sodium: 138 mmol/L (ref 135–145)

## 2017-11-08 LAB — MAGNESIUM: MAGNESIUM: 2.4 mg/dL (ref 1.7–2.4)

## 2017-11-08 MED ORDER — FENTANYL CITRATE (PF) 100 MCG/2ML IJ SOLN
12.5000 ug | INTRAMUSCULAR | Status: DC | PRN
  Administered 2017-11-08 – 2017-11-10 (×24): 25 ug via INTRAVENOUS
  Filled 2017-11-08 (×25): qty 2

## 2017-11-08 MED ORDER — PRO-STAT SUGAR FREE PO LIQD
30.0000 mL | Freq: Every day | ORAL | Status: DC
  Administered 2017-11-09: 30 mL
  Filled 2017-11-08 (×2): qty 30

## 2017-11-08 MED ORDER — VITAL AF 1.2 CAL PO LIQD
1000.0000 mL | ORAL | Status: DC
  Administered 2017-11-08 – 2017-11-09 (×2): 1000 mL
  Filled 2017-11-08 (×4): qty 1000

## 2017-11-08 NOTE — Progress Notes (Signed)
CRRT machine alerted filter was clotting. At 2144 CRRT was stopped for a filter change. Blood was not returned to patient due to filter clotting. The machine required a total restart at this time, as well. New filter was placed and primed with heparinized saline. The patient was reconnected and CRRT was restarted at 2223 without incident.

## 2017-11-08 NOTE — Plan of Care (Signed)
  Problem: Clinical Measurements: Goal: Will remain free from infection Outcome: Progressing Goal: Diagnostic test results will improve Outcome: Progressing Goal: Respiratory complications will improve Outcome: Progressing   Problem: Nutrition: Goal: Adequate nutrition will be maintained Outcome: Progressing   Problem: Education: Goal: Knowledge of General Education information will improve Description Including pain rating scale, medication(s)/side effects and non-pharmacologic comfort measures Outcome: Not Progressing   Problem: Clinical Measurements: Goal: Ability to maintain clinical measurements within normal limits will improve Outcome: Not Progressing Goal: Cardiovascular complication will be avoided Outcome: Not Progressing   Problem: Activity: Goal: Risk for activity intolerance will decrease Outcome: Not Progressing   Problem: Coping: Goal: Level of anxiety will decrease Outcome: Not Progressing   Problem: Elimination: Goal: Will not experience complications related to bowel motility Outcome: Not Progressing Goal: Will not experience complications related to urinary retention Outcome: Not Progressing   Problem: Pain Managment: Goal: General experience of comfort will improve Outcome: Not Progressing   Problem: Safety: Goal: Ability to remain free from injury will improve Outcome: Not Progressing   Problem: Skin Integrity: Goal: Risk for impaired skin integrity will decrease Outcome: Not Progressing

## 2017-11-08 NOTE — Progress Notes (Signed)
[facility] ROUNDING NOTE   Subjective:   Continues on CRRT still having diffuse pain.  Very little urine output.  Appreciate palliative medicine involvement  Blood pressure 130/40 pulse of 88 temperature 98.1 O2 sats 98% 2 L nasal cannula  Sodium 138 potassium 4.3 chloride 104 CO2 22 BUN 13 creatinine 1.55 glucose 179 Albumin 2.6 magnesium 2.4 phosphorus 3.0 WBC 7.4 hemoglobin 7.4 platelets 117     Objective:  Vital signs in last 24 hours:  Temp:  [97.8 F (36.6 C)-98.4 F (36.9 C)] 98.1 F (36.7 C) (10/31 1200) Pulse Rate:  [87-92] 88 (10/31 1300) Resp:  [13-22] 19 (10/31 1300) BP: (109-154)/(26-47) 115/33 (10/31 1200) SpO2:  [98 %-100 %] 99 % (10/31 1300)  Weight change:  Filed Weights   11/05/17 0341 11/06/17 0400 11/07/17 0800  Weight: 118.2 kg 119.4 kg 116.9 kg    Intake/Output: I/O last 3 completed shifts: In: 615.5 [I.V.:228.8; NG/GT:286.7; IV Piggyback:100] Out: 7564 [Urine:40; Other:4000]   Intake/Output this shift:  Total I/O In: 230 [I.V.:60; Other:10; NG/GT:160] Out: 862 [Other:862]  CVS- RRR murmurs rubs or gallops RS- CTA diminished at bases ABD- BS present soft non-distended proactive bowel sounds  EXT-chubby appearing lower extremities   Basic Metabolic Panel: Recent Labs  Lab 11/04/17 0458 11/05/17 0351  11/06/17 0416 11/06/17 1827 11/07/17 0533 11/07/17 1600 11/08/17 0625  NA 135 138   < > 138 139 137 138 138  K 3.5 4.0   < > 4.3 4.2 4.2 4.1 4.3  CL 90* 98   < > 103 105 105 106 104  CO2 31 29   < > 24 21* 23 22 22   GLUCOSE 103* 94   < > 106* 119* 106* 118* 179*  BUN 62* 33*   < > 22 19 16 14 13   CREATININE 4.56* 2.60*   < > 1.93* 1.72* 1.70* 1.62* 1.55*  CALCIUM 6.8* 7.2*   < > 7.2* 7.0* 7.3* 7.2* 7.7*  MG 2.4 2.5*  --  2.5*  --  2.7*  --  2.4  PHOS 5.6* 4.1   < > 3.7 3.8 3.1 3.1 3.0   < > = values in this interval not displayed.    Liver Function Tests: Recent Labs  Lab 11/05/17 0351 11/05/17 1600  11/06/17 1827 11/07/17 1600 11/08/17 0625  ALBUMIN 2.7* 2.7* 2.6* 2.7* 2.6*   No results for input(s): LIPASE, AMYLASE in the last 168 hours. No results for input(s): AMMONIA in the last 168 hours.  CBC: Recent Labs  Lab 11/06/17 0416 11/06/17 1827 11/07/17 0533 11/08/17 0625  WBC 8.5  --  6.6 7.4  HGB 7.0* 7.4* 7.5* 7.4*  HCT 25.3* 26.4* 26.4* 26.3*  MCV 71.9*  --  75.2* 76.2*  PLT 143*  --  131* 117*    Cardiac Enzymes: No results for input(s): CKTOTAL, CKMB, CKMBINDEX, TROPONINI in the last 168 hours.  BNP: Invalid input(s): POCBNP  CBG: Recent Labs  Lab 11/07/17 2013 11/08/17 0014 11/08/17 0346 11/08/17 0754 11/08/17 1153  GLUCAP 134* 147* 153* 168* 177*    Microbiology: Results for orders placed or performed during the hospital encounter of 10/30/17  Gastrointestinal Panel by PCR , Stool     Status: None   Collection Time: 10/30/17  9:59 AM  Result Value Ref Range Status   Campylobacter species NOT DETECTED NOT DETECTED Final   Plesimonas shigelloides NOT DETECTED NOT DETECTED Final   Salmonella species NOT DETECTED NOT DETECTED Final   Yersinia enterocolitica NOT DETECTED NOT DETECTED  Final   Vibrio species NOT DETECTED NOT DETECTED Final   Vibrio cholerae NOT DETECTED NOT DETECTED Final   Enteroaggregative E coli (EAEC) NOT DETECTED NOT DETECTED Final   Enteropathogenic E coli (EPEC) NOT DETECTED NOT DETECTED Final   Enterotoxigenic E coli (ETEC) NOT DETECTED NOT DETECTED Final   Shiga like toxin producing E coli (STEC) NOT DETECTED NOT DETECTED Final   Shigella/Enteroinvasive E coli (EIEC) NOT DETECTED NOT DETECTED Final   Cryptosporidium NOT DETECTED NOT DETECTED Final   Cyclospora cayetanensis NOT DETECTED NOT DETECTED Final   Entamoeba histolytica NOT DETECTED NOT DETECTED Final   Giardia lamblia NOT DETECTED NOT DETECTED Final   Adenovirus F40/41 NOT DETECTED NOT DETECTED Final   Astrovirus NOT DETECTED NOT DETECTED Final   Norovirus GI/GII  NOT DETECTED NOT DETECTED Final   Rotavirus A NOT DETECTED NOT DETECTED Final   Sapovirus (I, II, IV, and V) NOT DETECTED NOT DETECTED Final    Comment: Performed at [facility], [address] [place], [place] [address]  C difficile quick scan w PCR reflex     Status: Abnormal   Collection Time: 10/30/17  9:59 AM  Result Value Ref Range Status   C Diff antigen POSITIVE (A) NEGATIVE Final   C Diff toxin NEGATIVE NEGATIVE Final   C Diff interpretation Results are indeterminate. See PCR results.  Final    Comment: Performed at [facility], [place] [address] [place], [place] [address]  C. Diff by PCR, Reflexed     Status: Abnormal   Collection Time: 10/30/17  9:59 AM  Result Value Ref Range Status   Toxigenic C. Difficile by PCR POSITIVE (A) NEGATIVE Final    Comment: Positive for toxigenic C. difficile with little to no toxin production. Only treat if clinical presentation suggests symptomatic illness. Performed at [facility], [place] [address], [place], [place] [address]   Urine Culture     Status: None   Collection Time: 11/03/17  1:07 AM  Result Value Ref Range Status   Specimen Description   Final    URINE, CATHETERIZED Performed at [facility] [address], [place], [place] [address]    Special Requests NONE  Final   Culture   Final    NO GROWTH Performed at [facility], [place] [address], [place], [place] [address]    Report Status 11/04/2017 FINAL  Final  MRSA PCR Screening     Status: None   Collection Time: 11/03/17 10:49 AM  Result Value Ref Range Status   MRSA by PCR NEGATIVE NEGATIVE Final    Comment:        The GeneXpert MRSA Assay (FDA approved for NASAL specimens only), is one component of a comprehensive MRSA colonization surveillance program. It is not intended to diagnose MRSA infection nor to guide or monitor treatment for MRSA infections. Performed at [facility], [place]  [address] [place], [place] [address]     Coagulation Studies: No results for input(s): LABPROT, INR in the last 72 hours.  Urinalysis: No results for input(s): COLORURINE, LABSPEC, PHURINE, GLUCOSEU, HGBUR, BILIRUBINUR, KETONESUR, PROTEINUR, UROBILINOGEN, NITRITE, LEUKOCYTESUR in the last 72 hours.  Invalid input(s): APPERANCEUR    Imaging: Dg Abd 1 View  Result Date: 11/07/2017 CLINICAL DATA:  84 year old female with feeding tube placement. EXAM: ABDOMEN - 1 VIEW COMPARISON:  Earlier abdominal radiograph dated 11/07/2017 FINDINGS: No significant change in the appearance or positioning of the enteric tube which appears somewhat  folded, likely in the mid to distal stomach. IMPRESSION: No significant interval change. Electronically Signed   By: Anner Crete M.D.   On: 11/07/2017 23:00   Dg Chest Port 1 View  Result Date: 11/08/2017 CLINICAL DATA:  Respiratory failure. EXAM: PORTABLE CHEST 1 VIEW COMPARISON:  11/07/2017 and older exams. FINDINGS: Mild enlargement of the cardiopericardial silhouette, stable. Prominent bronchovascular markings, with additional lung base opacity, the latter consistent with atelectasis, also without change from the previous day's exam. New nasal/orogastric tube passes below the included field of view, likely into the stomach. Right subclavian dual lumen central venous catheter is stable, tip in the right atrium. No pneumothorax. IMPRESSION: 1. No significant change in lung aeration from the previous day's study. There are prominent bronchovascular markings and lung base atelectasis, but no convincing pulmonary edema or pneumonia. 2. New nasal/orogastric tube. Tip not visualized, but likely extends into the stomach. Electronically Signed   By: Lajean Manes M.D.   On: 11/08/2017 07:13   Dg Chest Port 1 View  Result Date: 11/07/2017 CLINICAL DATA:  Acute respiratory failure with hypoxia. EXAM: PORTABLE CHEST 1 VIEW COMPARISON:  10/31/2017. FINDINGS:  Cardiomegaly. Improved lung volumes. Increased perihilar markings could represent early edema or viral pneumonitis. Other than improved lung markings, similar appearance to priors. Dialysis catheter has been inserted via RIGHT IJ approach. Tip slide in the RIGHT atrium. There is no pneumothorax. IMPRESSION: Satisfactory catheter placement. No significant change in the appearance of the lungs except for improved volume. Cardiomegaly. Electronically Signed   By: Staci Righter M.D.   On: 11/07/2017 11:30   Dg Abd Portable 1v  Result Date: 11/07/2017 CLINICAL DATA:  Nasogastric tube placement. EXAM: PORTABLE ABDOMEN - 1 VIEW COMPARISON:  11/03/2017 FINDINGS: Interval nasogastric tube folded back upon itself in the mid to distal stomach with its tip in the mid stomach. The included bowel gas pattern is normal. Prominent interstitial markings at the left lung base. Lower thoracic spine degenerative changes. IMPRESSION: Nasogastric tube folded back upon itself in the mid to distal stomach with its tip in the mid stomach. Electronically Signed   By: Claudie Revering M.D.   On: 11/07/2017 13:13     Medications:   .  prismasol BGK 4/2.5 500 mL/hr at 11/08/17 0959  .  prismasol BGK 4/2.5 300 mL/hr at 11/08/17 0308  . sodium chloride Stopped (11/07/17 0800)  . famotidine (PEPCID) IV Stopped (11/07/17 1839)  . feeding supplement (VITAL AF 1.2 CAL) 70 mL/hr at 11/08/17 1300  . heparin 999 mL/hr at 11/07/17 1000  . prismasol BGK 4/2.5 1,500 mL/hr at 11/08/17 0958   . sodium chloride   Intravenous Once  . Chlorhexidine Gluconate Cloth  6 each Topical Daily  . [START ON 11/09/2017] feeding supplement (PRO-STAT SUGAR FREE 64)  30 mL Per Tube Daily  . insulin aspart  0-5 Units Subcutaneous Q4H  . mouth rinse  15 mL Mouth Rinse BID  . sodium chloride flush  10-40 mL Intracatheter Q12H   acetaminophen, fentaNYL (SUBLIMAZE) injection, heparin, heparin, ipratropium-albuterol, metoprolol tartrate, sodium chloride  flush  Assessment/ Plan:   1. AKIin setting of volume depletion- presumably ischemic ATN, oliguric. Unfortunately her volume status was worsening as well as her metabolic acidosis so she was started on CVVHD on 11/03/17. Electrolytes have improved, however she remains oliguric/anuricdespite IV lasix.Have discontinued IV Lasix  1. CVVHD with goal UF of 50-172ml/hr as bp toleratescontinues to UF fluid.  Continue filtration weight continues to decrease with great fluid removal 2. Replacement  fluids and dialysate all prismasate 4K/2.5Ca and is receiving heparin.  Slight drop in platelet count we will continue to follow 2. Volume/HTN- will cont to UF with CVVHD and follow. Appears to be heading towards euvolemia  3. Colitis- with diarrhea onIVceftriaxone and flagyl 4. Metabolic acidosis due to #4,TML overcorrected with CVVHD. Will change replacement fluids to 4K/2.5Ca and follow. 5. Microcytic anemia- transfuse prn 6. DM type 2 7. HTN- off meds due to hypotension and volume depletion. 8. AMS-appears to be slightly improved but continued with pain 9. Palliative medicine initiate consultation family heading towards discontinuation of CRRT.  I believe this is appropriate   LOS: Morgan @TODAY @1 :18 PM

## 2017-11-08 NOTE — Progress Notes (Addendum)
PULMONARY / CRITICAL CARE MEDICINE   NAME:  Lauren Crosby, MRN:  [id number], DOB:  08/01/1931, LOS: 64 ADMISSION DATE:  10/30/2017, CONSULTATION DATE:  11/03/17 REFERRING MD:  [facility]/ renal  CHIEF COMPLAINT:  Acidosis/ renal failure/ delirium   BRIEF HISTORY:    84 yowf morbid obesity  Complicated byh DM/ CRI and hbp adm 10/30/17 with diarrhea and worsening renal insuff  With dx of prob c diff colitis/ met acidosis and worsening mental status am 10/26 so transferred to ICU for   HD catheter placement / initiation of cvvh and pccm service consulted.     SIGNIFICANT EVENTS:  started CVVH  Pm 10/26 10/29: Goals of care discussed, no CODE STATUS/partial code agreed-upon 10/30: Remains on CRRT.  About 2 L positive remarkably deconditioned, malnourished, and remains encephalopathic.  Starting tube feeds. 10/31: hemodynamically stable. Volume status better. Having a lot of pain. Husband talking about transitioning to comfort. Made full DNR  STUDIES:     CULTURES:  C diff 10/22 pos pcr/ pos antigen neg for toxin UC 10/26 >>> neg  ANTIBIOTICS:  Vanc po 10/22 > 10/23 Flagyl 10/22>10/28 Rocephin 10/22 >10/28  LINES/TUBES:   HD catheter per IR  R SCVein  10/26  CONSULTANTS:  Renal  10/24   SUBJECTIVE:  Hurting in back   CONSTITUTIONAL: Blood Pressure (Abnormal) 150/30 (BP Location: Right Arm)   Pulse 92   Temperature 98.2 F (36.8 C) (Axillary)   Respiration 20   Height 5' 2"  (1.575 m)   Weight 116.9 kg   Oxygen Saturation 99%   Body Mass Index 47.14 kg/m   On 2lpm NP  I/O last 3 completed shifts: In: 615.5 [I.V.:228.8; NG/GT:286.7; IV Piggyback:100] Out: 0300 [Urine:40; Other:4000]   PHYSICAL EXAM:  General: acute on chronically ill appearing white female. HENT: NCAT. MM are dry and caked. Pulm: scattered rhonchi, mild accessory use. Card: tachy RRR. Abd: obese. + bowel sounds. No OM. EXT: chronic LE edema. + Pulses. Warm and dry Neuro: awake, c/o pain primarily between  shoulders. GU: anuric.   RESOLVED PROBLEM LIST  Metabolic acidosis (both NAG and AG)-->resolved as of 10/29 after starting CRRT Possible sepsis PMC vs gastroenteritis (completed course ctx and flagyl) ASSESSMENT AND PLAN    AKI in setting of volume depletion and sepsis Scr improved.  Anuric essentially  Essentially even volume status since admit  Plan Cont CRRT per nephro F/u am chem Strict I&O   Intermittent Fluid and electrolyte imbalance  Plan Trend chemistry and replace as indicated   Acute metabolic encephalopathy -c/o pain  Plan Cont supportive care PRN analgesia   DM cbgs 140s - 160s  Plan ssi Will cont to watch. May need to adjust basal dosing   Anemia -received 1 unit PRBC 10/29; hgb stable ~7.5 Plan Trend CBC Transfuse for hgb < 7   Severe Protein Calorie malnutrition Plan Cont tubefeeds   Severe deconditioning ->this may be a limiting factor to her recovery  Plan Need PT/OT   SUMMARY OF TODAY'S PLAN:  Remains critically ill. Long d/w the husband and 2 of the 6 children. We will likely be stopping HD in the near future. He hopes to "keep her comfortable". I fear in the best case scenario her getting better will be rewarded to wake up just enough to hurt. They are all seeing this daily which has changed I think how they feel about the situation. At this point she will be full DNR. No pressors, no BIPAP or airway. Will ask CM to  assess hospice benefits. I think she will likely die here in the hospital.    Best Practice / Goals of Care / Disposition.   DVT PROPHYLAXIS: PAS SUP: Pepcid IV NUTRITION: none  for now MOBILITY:bedrest for now GOALS OF CARE: DNR  FAMILY DISCUSSIONS: goals of care discussed 10/29 DISPOSITION  Hopefully back to baseline / husband very attentive at bedside but hard of hearing and not sure he always gets the picture   LABS  Glucose Recent Labs  Lab 11/07/17 1207 11/07/17 1721 11/07/17 2013 11/08/17 0014 11/08/17 0346  11/08/17 0754  GLUCAP 124* 119* 134* 147* 153* 168*    BMET Recent Labs  Lab 11/07/17 0533 11/07/17 1600 11/08/17 0625  NA 137 138 138  K 4.2 4.1 4.3  CL 105 106 104  CO2 23 22 22   BUN 16 14 13   CREATININE 1.70* 1.62* 1.55*  GLUCOSE 106* 118* 179*    Liver Enzymes Recent Labs  Lab 11/06/17 1827 11/07/17 1600 11/08/17 0625  ALBUMIN 2.6* 2.7* 2.6*    Electrolytes Recent Labs  Lab 11/06/17 0416  11/07/17 0533 11/07/17 1600 11/08/17 0625  CALCIUM 7.2*   < > 7.3* 7.2* 7.7*  MG 2.5*  --  2.7*  --  2.4  PHOS 3.7   < > 3.1 3.1 3.0   < > = values in this interval not displayed.    CBC Recent Labs  Lab 11/06/17 0416 11/06/17 1827 11/07/17 0533 11/08/17 0625  WBC 8.5  --  6.6 7.4  HGB 7.0* 7.4* 7.5* 7.4*  HCT 25.3* 26.4* 26.4* 26.3*  PLT 143*  --  131* 117*    ABG No results for input(s): PHART, PCO2ART, PO2ART in the last 168 hours.  Coag's No results for input(s): APTT, INR in the last 168 hours.  Sepsis Markers No results for input(s): LATICACIDVEN, PROCALCITON, O2SATVEN in the last 168 hours.  Cardiac Enzymes No results for input(s): TROPONINI, PROBNP in the last 168 hours.   Erick Colace ACNP-BC [place] Pager # [phone] OR # [phone] if no answer  Attending Note:  84 year old female with PMH of CKD and OSA/OHV who presents with C diff and septic shock leading to renal failure.  Patient has been on CRRT for days with no improvement in renal function.  On exam, she is very confused and not following commands with bibasilar crackles.  I reviewed CXR myself, low volume and pulmonary edema.  Spoke with patient's husband.  Informed her that the likelihood for her surviving is negligible and recommended comfort care.  After discussion, decision was made to proceed with full DNR status.  D/C CRRT and transfer to the floor and once deteriorates to proceed with comfort care.  Transfer to floor and to [facility] service with PCCM off  11/1.  The patient is critically ill with multiple organ systems failure and requires high complexity decision making for assessment and support, frequent evaluation and titration of therapies, application of advanced monitoring technologies and extensive interpretation of multiple databases.   Critical Care Time devoted to patient care services described in this note is  45  Minutes. This time reflects time of care of this signee Dr Jennet Maduro. This critical care time does not reflect procedure time, or teaching time or supervisory time of PA/NP/Med student/Med Resident etc but could involve care discussion time.  Rush Farmer, M.D. [facility] Pulmonary/Critical Care Medicine. Pager: [phone]. After hours pager: [phone].

## 2017-11-08 NOTE — Progress Notes (Signed)
Initial Nutrition Assessment  DOCUMENTATION CODES:   Morbid obesity  INTERVENTION:  - Will adjust TF regimen: 30 mL Prostat once/day with Vital AF 1.2 @ 40 mL/hr to advance by 10 mL every 6 hours to reach goal rate of 70 mL/hr.  - At goal rate, this regimen will provide 2116 kcal, 141 grams of protein, and 1362 mL free water.  NUTRITION DIAGNOSIS:   Increased nutrient needs related to acute illness, other (see comment)(CRRT) as evidenced by estimated needs.  GOAL:   Patient will meet greater than or equal to 90% of their needs  MONITOR:   TF tolerance, Weight trends, Labs  REASON FOR ASSESSMENT:   Consult Enteral/tube feeding initiation and management  ASSESSMENT:   84 year-old female with hx of morbid obesity, DM, CRI. She was admitted on 10/30/17 with diarrhea and worsening renal insufficiency. She has been diagnosed with c.diff colitis, metabolic acidosis, and worsening mentation as of 10/26.  D/t worsening mentation, she was transferred to ICU and HD cath was placed with the initiation of CRRT on the evening of 10/26.  Patient had small bore NGT placed in L nare yesterday and TF protocol initiated. Patient is currently receiving Vital High Protein @ 40 mL/hr with 30 mL Prostat BID. This regimen is providing 1160 kcal, 114 grams of protein, and 802 mL free water. Last documented PO intake was 50% of breakfast on 10/25 (2 gm Na diet) and patient changed to NPO on 10/26. Will adjust TF regimen as outlined above.   Patient has been on CRRT since 10/26.   No family/visitors present at this time; they are in the meeting room discussing with Dr. Rowe Pavy, Palliative Care.   Medications reviewed; 20 mg IV Pepcid/day, sliding scale Novolog. Labs reviewed; CBGs: 147, 153, and 168 mg/dL today, creatinine: 1.55 mg/dL, Ca: 7.7 mg/dL, GFR: 30 mL/min.    NUTRITION - FOCUSED PHYSICAL EXAM:  Completed; no muscle and no fat wasting, noted severe edema to BLE.   Diet Order:   Diet Order     None      EDUCATION NEEDS:   No education needs have been identified at this time  Skin:  Skin Assessment: Reviewed RN Assessment  Last BM:  10/28  Height:   Ht Readings from Last 1 Encounters:  11/04/17 5\' 2"  (1.575 m)    Weight:   Wt Readings from Last 1 Encounters:  11/07/17 116.9 kg    Ideal Body Weight:  50 kg  BMI:  Body mass index is 47.14 kg/m.  Estimated Nutritional Needs:   Kcal:  2706-2376  Protein:  130-140 grams  Fluid:  >/= 2.1 L/day     Jarome Matin, MS, RD, LDN, [facility] Inpatient Clinical Dietitian Pager # [phone] After hours/weekend pager # [phone]

## 2017-11-08 NOTE — Progress Notes (Signed)
Daily Progress Note   Patient Name: Lauren Crosby       Date: 11/08/2017 DOB: 1931/07/23  Age: 84 y.o. MRN#: [id number] Attending Physician: Tanda Rockers, MD Primary Care Physician: [name], MD Admit Date: 10/30/2017  Reason for Consultation/Follow-up: Establishing goals of care  Subjective:  patient is very confused today, not following commands. See below  Length of Stay: 8  Current Medications: Scheduled Meds:  . sodium chloride   Intravenous Once  . Chlorhexidine Gluconate Cloth  6 each Topical Daily  . [START ON 11/09/2017] feeding supplement (PRO-STAT SUGAR FREE 64)  30 mL Per Tube Daily  . insulin aspart  0-5 Units Subcutaneous Q4H  . mouth rinse  15 mL Mouth Rinse BID  . sodium chloride flush  10-40 mL Intracatheter Q12H    Continuous Infusions: .  prismasol BGK 4/2.5 500 mL/hr at 11/08/17 0959  .  prismasol BGK 4/2.5 300 mL/hr at 11/08/17 0308  . sodium chloride Stopped (11/07/17 0800)  . famotidine (PEPCID) IV Stopped (11/07/17 1839)  . feeding supplement (VITAL AF 1.2 CAL) 1,000 mL (11/08/17 1206)  . heparin 999 mL/hr at 11/07/17 1000  . prismasol BGK 4/2.5 1,500 mL/hr at 11/08/17 0958    PRN Meds: acetaminophen, fentaNYL (SUBLIMAZE) injection, heparin, heparin, ipratropium-albuterol, metoprolol tartrate, sodium chloride flush  Physical Exam         Weak lady confused appearing Severely debilitated Diminished breath sounds S1 and S2 Abdomen is soft non tender Generalized edema  Vital Signs: BP (!) 115/33 (BP Location: Right Arm)   Pulse 89   Temp 98.2 F (36.8 C) (Axillary)   Resp (!) 21   Ht _0  (1.575 m)   Wt 116.9 kg   SpO2 99%   BMI 47.14 kg/m  SpO2: SpO2: 99 % O2 Device: O2 Device: Nasal Cannula O2 Flow Rate: O2 Flow Rate  (L/min): 2 L/min  Intake/output summary:   Intake/Output Summary (Last 24 hours) at 11/08/2017 1224 Last data filed at 11/08/2017 1200 Gross per 24 hour  Intake 666.67 ml  Output 2868 ml  Net -2201.33 ml   LBM: Last BM Date: 11/05/17 Baseline Weight: Weight: 115.7 kg Most recent weight: Weight: 116.9 kg       Palliative Assessment/Data: Palliative performance scale 20%     Patient Active Problem List   Diagnosis  Date Noted  . Acute respiratory failure with hypoxemia ([place])   . Acute pulmonary edema (HCC)   . Acute encephalopathy   . Palliative care by specialist   . Goals of care, counseling/discussion   . Hyperglycemia due to type 2 diabetes mellitus ([place]) 11/04/2017  . Encephalopathy acute 11/04/2017  . Shock circulatory ([place]) 11/03/2017  . Colitis, acute 10/31/2017  . ATN (acute tubular necrosis) ([place]) 10/31/2017  . Diarrhea 10/30/2017  . CKD (chronic kidney disease) stage 3, GFR 30-59 ml/min (HCC) 10/30/2017  . Pyelitis: Mild per CT 05/24/2017 05/25/2017  . Acidemia   . Abdominal pain 05/24/2017  . Vaginal candidiasis 05/24/2017  . Dermatitis associated with moisture 05/24/2017  . Ileus [facility]): Colonic 05/24/2017  . Acute lower UTI 05/23/2017  . Iron deficiency anemia 05/23/2017  . Pressure injury of buttock, stage 2 ([place]) 05/23/2017  . Acute on chronic anemia   . Hyperkalemia   . Hydronephrosis   . Acute kidney failure ([place]) 05/22/2017  . Metabolic acidosis, normal anion gap (NAG) 05/22/2017  . Hyperkalemia, diminished renal excretion 05/22/2017  . Rhinovirus infection   . AKI (acute kidney injury) ([place])   . HCAP (healthcare-associated pneumonia) 01/12/2017  . ARF (acute renal failure) ([place]) 01/12/2017  . Shortness of breath   . Retinopathy due to secondary diabetes mellitus ([place])   . Pneumonia   . Obesity   . 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 ([place])   . Nephrolithiasis   . Hypertension   . Hyperlipemia   . Hepatitis   . Gout   . Goiter     . GERD (gastroesophageal reflux disease)   . Diverticulitis   . Diabetes ([place])   . Colon polyp   . Cellulitis and abscess of trunk   . Arthritis   . Chronic anemia   . Adrenal adenoma   . Morbid obesity ([place]) 08/13/2013  . Postoperative anemia due to acute blood loss 08/13/2013  . S/P right TKA 08/11/2013  . Preop cardiovascular exam 08/14/2012  . Chest pain 08/14/2012  . Suture granuloma 02/09/2012  . DYSPNEA ON EXERTION 10/04/2009  . HYPERLIPIDEMIA 04/15/2007  . OBESITY 04/15/2007  . ANEMIA, CHRONIC 04/15/2007  . ANXIETY 04/15/2007  . DEPRESSION 04/15/2007  . HTN (hypertension) 04/15/2007  . GERD 04/15/2007  . DEGENERATIVE JOINT DISEASE 04/15/2007  . HELICOBACTER PYLORI INFECTION, HX OF 04/15/2007  . COLONIC POLYPS 10/04/2006  . DIVERTICULOSIS, COLON 10/04/2006  . REFLUX ESOPHAGITIS May 27, 202003  . GASTRITIS, ACUTE May 27, 202003    Palliative Care Assessment & Plan   Patient Profile:    Assessment:  84 year old lady who lives at home with her husband, has 6 children, she has been admitted with diarrhea, renal insufficiency, probable C. Difficile colitis, metabolic acidosis. She has underlying diabetes, morbid obesity and chronic renal insufficiency. Hospital course complicated by worsening metabolic acidosis, worsening mental status, worsening renal insufficiency to the point of requiring CRRT. Patient remains in stepdown [facility] at [facility].   palliative care following for ongoing goals of care discussions  Recommendations/Plan:  Goals of care discussions: family meeting with husband and daughter present in the waiting area outside of stepdown unit. Other family members also present. Discussed with PC CM colleague Salvadore Dom, NP.  Family meeting was held outside of the patient's room in the stepdown unit. I met with the patient's husband and daughter Lattie Haw as well as other children and several other family members. Partially, for part of the meeting, RN  Darrick Meigs was also present. We reviewed about the patient's current  condition. She has remained in stepdown unit for several days, has been on chronic renal replacement therapy, has underlying chronic kidney disease, obstructive sleep apnea, obesity hypoventilation syndrome. She initially came in for Clostridium difficile infection septic shock that led to renal failure.  Re discussed with family that the patient is up against a lot of serious and in incurable conditions, she has a high risk for not surviving this hospitalization. We discussed about what. Of comfort measures means. We discussed about the type of care provided after discontinuation of dialysis entails. Discussed about judicious use of opioids and benzodiazepines for comfort purposes. Discussed frankly and compassionately that the patient may not be stable enough to be transported to residential hospice after dialysis is discontinued.  Family does not wish to proceed with discontinuation of renal replacement therapy just yet. They would wish for the patient to continue to remain in stepdown unit. They realize the seriousness and in incurable nature of her illness. They state that some other family members are going to visit with her today.  Family wishes to to ask about decreasing dosages of pain medications. Patient is on fentanyl. Patient has a range for her fentanyl. Discussed compassionately about balancing awaken ess and alertness along with maintaining appropriate level of comfort. Family would like to see the patient can be awake alert enough to speak with them.  Husband is asking about Ace wrapping her lower extremities to assist with her pain and discomfort in her legs.  We discussed about end-of-life signs and symptoms as well. All of the patient's questions and concerns addressed to the best of my ability.  While the patient's husband and family understands the gravity of the situation, there goals not to proceed with comfort  care today. Palliative medicine team to continue to follow along, provide supportive care to the patient and family as a unit, monitor disease trajectory and hospitalization course and assisted with symptom management and appropriate disposition planning as deemed feasible.     Code Status:    Code Status Orders  (From admission, onward)         Start     Ordered   11/06/17 0957  Limited resuscitation (code)  Continuous    Question Answer Comment  In the event of cardiac or respiratory ARREST: Initiate Code Blue, Call Rapid Response Yes   In the event of cardiac or respiratory ARREST: Perform CPR No   In the event of cardiac or respiratory ARREST: Perform Intubation/Mechanical Ventilation No   In the event of cardiac or respiratory ARREST: Use NIPPV/BiPAp only if indicated Yes   In the event of cardiac or respiratory ARREST: Administer ACLS medications if indicated Yes   In the event of cardiac or respiratory ARREST: Perform Defibrillation or Cardioversion if indicated No      11/06/17 0956        Code Status History    Date Active Date Inactive Code Status Order ID Comments User Context   10/30/2017 1415 11/06/2017 0956 Full Code [id number]  Caren Griffins, MD ED   05/22/2017 2250 06/01/2017 1900 Full Code [id number]  Etta Quill, DO ED   01/13/2017 0250 01/15/2017 1622 Full Code [id number]  Jani Gravel, MD ED   08/11/2013 1955 08/13/2013 1559 Full Code [id number]  Babish, Lucille Passy, PA-C Inpatient       Prognosis:   Unable to determine   Prognosis remains guarded Discharge Planning:  to be determined.   Care plan was discussed with  Patient, husband, daughter Lattie Haw  and several other family members present at the bedside  Thank you for allowing the Palliative Medicine Team to assist in the care of this patient.   Time In:  10 Time Out: 10.35 Total Time 35 Prolonged Time Billed  no       Greater than 50%  of this time was spent counseling and coordinating care related  to the above assessment and plan.  Loistine Chance, MD [phone]  Please contact Palliative Medicine Team phone at [phone] for questions and concerns.

## 2017-11-09 ENCOUNTER — Inpatient Hospital Stay (HOSPITAL_COMMUNITY): Payer: Medicare HMO

## 2017-11-09 LAB — RENAL FUNCTION PANEL
ALBUMIN: 2.7 g/dL — AB (ref 3.5–5.0)
ALBUMIN: 2.8 g/dL — AB (ref 3.5–5.0)
ANION GAP: 7 (ref 5–15)
ANION GAP: 8 (ref 5–15)
BUN: 15 mg/dL (ref 8–23)
BUN: 17 mg/dL (ref 8–23)
CALCIUM: 7.5 mg/dL — AB (ref 8.9–10.3)
CALCIUM: 7.7 mg/dL — AB (ref 8.9–10.3)
CO2: 25 mmol/L (ref 22–32)
CO2: 24 mmol/L (ref 22–32)
Chloride: 104 mmol/L (ref 98–111)
Chloride: 105 mmol/L (ref 98–111)
Creatinine, Ser: 1.58 mg/dL — ABNORMAL HIGH (ref 0.44–1.00)
Creatinine, Ser: 1.48 mg/dL — ABNORMAL HIGH (ref 0.44–1.00)
GFR calc Af Amer: 34 mL/min — ABNORMAL LOW (ref >60)
GFR calc Af Amer: 36 mL/min — ABNORMAL LOW (ref >60)
GFR calc non Af Amer: 29 mL/min — ABNORMAL LOW (ref >60)
GFR calc non Af Amer: 31 mL/min — ABNORMAL LOW (ref >60)
GLUCOSE: 207 mg/dL — AB (ref 70–99)
GLUCOSE: 240 mg/dL — AB (ref 70–99)
PHOSPHORUS: 2.3 mg/dL — AB (ref 2.5–4.6)
PHOSPHORUS: 1.9 mg/dL — AB (ref 2.5–4.6)
Potassium: 3.9 mmol/L (ref 3.5–5.1)
Potassium: 3.9 mmol/L (ref 3.5–5.1)
SODIUM: 136 mmol/L (ref 135–145)
Sodium: 137 mmol/L (ref 135–145)

## 2017-11-09 LAB — GLUCOSE, CAPILLARY
GLUCOSE-CAPILLARY: 206 mg/dL — AB (ref 70–99)
GLUCOSE-CAPILLARY: 224 mg/dL — AB (ref 70–99)
Glucose-Capillary: 209 mg/dL — ABNORMAL HIGH (ref 70–99)
Glucose-Capillary: 238 mg/dL — ABNORMAL HIGH (ref 70–99)
Glucose-Capillary: 216 mg/dL — ABNORMAL HIGH (ref 70–99)
Glucose-Capillary: 210 mg/dL — ABNORMAL HIGH (ref 70–99)

## 2017-11-09 LAB — MAGNESIUM: Magnesium: 2.5 mg/dL — ABNORMAL HIGH (ref 1.7–2.4)

## 2017-11-09 MED ORDER — HALOPERIDOL LACTATE 5 MG/ML IJ SOLN
5.0000 mg | Freq: Once | INTRAMUSCULAR | Status: AC
  Administered 2017-11-09: 5 mg via INTRAVENOUS
  Filled 2017-11-09: qty 1

## 2017-11-09 MED ORDER — INSULIN ASPART 100 UNIT/ML ~~LOC~~ SOLN
0.0000 [IU] | SUBCUTANEOUS | Status: DC
  Administered 2017-11-09 (×2): 3 [IU] via SUBCUTANEOUS
  Administered 2017-11-10 – 2017-11-11 (×5): 2 [IU] via SUBCUTANEOUS
  Administered 2017-11-11: 3 [IU] via SUBCUTANEOUS
  Administered 2017-11-11 (×2): 2 [IU] via SUBCUTANEOUS
  Administered 2017-11-11: 3 [IU] via SUBCUTANEOUS
  Administered 2017-11-11 – 2017-11-12 (×2): 2 [IU] via SUBCUTANEOUS
  Administered 2017-11-12 (×4): 3 [IU] via SUBCUTANEOUS
  Administered 2017-11-12: 2 [IU] via SUBCUTANEOUS
  Administered 2017-11-13: 5 [IU] via SUBCUTANEOUS
  Administered 2017-11-13: 2 [IU] via SUBCUTANEOUS
  Administered 2017-11-13: 3 [IU] via SUBCUTANEOUS
  Administered 2017-11-13 – 2017-11-14 (×4): 2 [IU] via SUBCUTANEOUS
  Administered 2017-11-14: 5 [IU] via SUBCUTANEOUS
  Administered 2017-11-14: 1 [IU] via SUBCUTANEOUS
  Administered 2017-11-14: 5 [IU] via SUBCUTANEOUS
  Administered 2017-11-14: 2 [IU] via SUBCUTANEOUS
  Administered 2017-11-14 – 2017-11-15 (×2): 3 [IU] via SUBCUTANEOUS
  Administered 2017-11-15: 2 [IU] via SUBCUTANEOUS
  Administered 2017-11-15: 3 [IU] via SUBCUTANEOUS
  Administered 2017-11-15: 2 [IU] via SUBCUTANEOUS
  Administered 2017-11-15: 3 [IU] via SUBCUTANEOUS
  Administered 2017-11-15: 2 [IU] via SUBCUTANEOUS
  Administered 2017-11-16: 5 [IU] via SUBCUTANEOUS
  Administered 2017-11-16: 3 [IU] via SUBCUTANEOUS
  Administered 2017-11-16: 2 [IU] via SUBCUTANEOUS
  Administered 2017-11-16: 3 [IU] via SUBCUTANEOUS
  Administered 2017-11-16: 2 [IU] via SUBCUTANEOUS

## 2017-11-09 NOTE — Progress Notes (Signed)
PROGRESS NOTE    Lauren Crosby  BSJ:[id number] DOB: [birth year] DOA: 10/30/2017 PCP: [name], MD    Brief Narrative:  84 year old with past medical history relevant for type 2 diabetes on insulin, 3 CKD, hypertension, hyperlipidemia, chronic bilateral lower extremity weakness, class III obesity admitted on 10/30/2017 with C. difficile colitis and diarrhea and diffuse weakness with a prolonged hospital course complicated by septic shock and AKI requiring initiation of CRRT and ICU stay and encephalopathy.   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 ([place])   Hypertension   Hyperlipemia   ARF (acute renal failure) (HCC)   Acute kidney failure (HCC)   Metabolic acidosis, normal anion gap (NAG)   Abdominal pain   Diarrhea   CKD (chronic kidney disease) stage 3, GFR 30-59 ml/min (HCC)   Colitis, acute   ATN (acute tubular necrosis) (HCC)   Shock circulatory (HCC)   Hyperglycemia due to type 2 diabetes mellitus ([place])   Encephalopathy acute   Goals of care, counseling/discussion   Acute respiratory failure with hypoxemia ([place])   Acute pulmonary edema (HCC)   Acute encephalopathy   Palliative care by specialist   #) Metabolic encephalopathy: Patient continues to be diffusely encephalopathy pathic complaining of pain, intermittently agitated, screaming out.  Unfortunately there does not appear to be a reversible component to this.  With her multiple medical comorbidities she is not a good candidate for outpatient dialysis. -Palliative care following -We will aggressively treat pain with fentanyl  #) C. difficile colitis, gated by septic shock and AKI with initiation of CRRT: Patient required CRRT initiation on 11/03/2017 for septic shock.  She has been treated completely for C. difficile colitis and no longer has any further diarrhea.  Fortunately per above it is not clear that she will  recover from this and the family is transitioning to palliative initiation. -We will discontinue CRRT at recommendation of family, currently having 101 mL's removed per hour and tolerating well -Nephrology following appreciate recommendations  #) Type 2 diabetes on insulin: - Hold 70/3045 units every morning and 50 units nightly -Sliding scale insulin every 4 hours - Tube feedings per below  #) Hypertension/hyperlipidemia: -Hold metoprolol tartrate 25 mg twice daily -Hold simvastatin 80 mg nightly  #) Chronic lower extremity edema: -Hold furosemide, dialysis per above  #) Gout:  -Hold allopurinol 300 mg daily  Fluids: Being managed via CRRT Electrolyte: Being managed via CRRT Nutrition: Tube feedings at 70 mils an hour of vital 1.2 kcal and feeding supplement  Prophylaxis: Heparin and CRRT  Disposition: Pending family arriving for discussion of withdrawing care  DO NOT RESUSCITATE   Consultants:   PCCM  Palliative care  Nephrology  Procedures:   [facility] catheter placement on 11/03/2017  CRRT initiated on 11/03/2017  Antimicrobials:   P.o. vancomycin 10/30/2017 to 11/05/2017  IV metronidazole 10/30/2017 to 11/05/2017  IV ceftriaxone 10/30/2017 to 11/05/2017   Subjective: Patient cannot provide me with much history other than to tell me her name.  She reports that she does not hurt in any one particular spot.  On discussion with her husband she has been intermittently moaning with pain and was required pain medications for nonspecific pain.  He is quite anxious and sad about her dying.  He is requesting the family to be present before any discussion of discontinuing CRRT is brought up.  Objective: Vitals:   11/09/17 0600 11/09/17 0700 11/09/17 0800  11/09/17 0900  BP: 105/70 (!) 119/28 (!) 115/40 (!) 116/27  Pulse: 92 91 93 91  Resp: (!) 25 (!) 29 (!) 23 (!) 23  Temp:   98 F (36.7 C)   TempSrc:   Oral   SpO2: 97% 98% 98% 99%  Weight:      Height:         Intake/Output Summary (Last 24 hours) at 11/09/2017 0959 Last data filed at 11/09/2017 0900 Gross per 24 hour  Intake 1148.29 ml  Output 3233 ml  Net -2084.71 ml   Filed Weights   11/06/17 0400 11/07/17 0800 11/09/17 0539  Weight: 119.4 kg 116.9 kg 109.5 kg    Examination:  General exam: Intermittently moaning in pain, appears uncomfortable Respiratory system: No increased work of breathing, anterior lung sounds clear, no wheezes, rhonchi, rales. Cardiovascular system: Stent heart sounds, regular rate and rhythm, no murmurs. Gastrointestinal system: Soft, mildly distended, no rebound or guarding, plus bowel sounds Central nervous system: Alert only to herself, intermittently moaning, grossly moving all extremities Extremities:Bilateral chronic lower extremity edema. Skin: , TDC catheter site is clean dry and intact Psychiatry: Unable to assess due to medical condition    Data Reviewed: I have personally reviewed following labs and imaging studies  CBC: Recent Labs  Lab 11/06/17 0416 11/06/17 1827 11/07/17 0533 11/08/17 0625  WBC 8.5  --  6.6 7.4  HGB 7.0* 7.4* 7.5* 7.4*  HCT 25.3* 26.4* 26.4* 26.3*  MCV 71.9*  --  75.2* 76.2*  PLT 143*  --  131* 409*   Basic Metabolic Panel: Recent Labs  Lab 11/05/17 0351  11/06/17 0416  11/07/17 0533 11/07/17 1600 11/08/17 0625 11/08/17 1659 11/09/17 0400  NA 138   < > 138   < > 137 138 138 139 136  K 4.0   < > 4.3   < > 4.2 4.1 4.3 4.1 3.9  CL 98   < > 103   < > 105 106 104 107 104  CO2 29   < > 24   < > 23 22 22 23 25   GLUCOSE 94   < > 106*   < > 106* 118* 179* 212* 207*  BUN 33*   < > 22   < > 16 14 13 15 15   CREATININE 2.60*   < > 1.93*   < > 1.70* 1.62* 1.55* 1.53* 1.58*  CALCIUM 7.2*   < > 7.2*   < > 7.3* 7.2* 7.7* 7.6* 7.5*  MG 2.5*  --  2.5*  --  2.7*  --  2.4  --  2.5*  PHOS 4.1   < > 3.7   < > 3.1 3.1 3.0 2.5 2.3*   < > = values in this interval not displayed.   GFR: Estimated Creatinine Clearance: 30.9  mL/min (A) (by C-G formula based on SCr of 1.58 mg/dL (H)). Liver Function Tests: Recent Labs  Lab 11/06/17 1827 11/07/17 1600 11/08/17 0625 11/08/17 1659 11/09/17 0400  ALBUMIN 2.6* 2.7* 2.6* 2.6* 2.7*   No results for input(s): LIPASE, AMYLASE in the last 168 hours. No results for input(s): AMMONIA in the last 168 hours. Coagulation Profile: No results for input(s): INR, PROTIME in the last 168 hours. Cardiac Enzymes: No results for input(s): CKTOTAL, CKMB, CKMBINDEX, TROPONINI in the last 168 hours. BNP (last 3 results) No results for input(s): PROBNP in the last 8760 hours. HbA1C: No results for input(s): HGBA1C in the last 72 hours. CBG: Recent Labs  Lab 11/08/17 1539  11/08/17 2006 11/08/17 2344 11/09/17 0402 11/09/17 0733  GLUCAP 207* 194* 236* 209* 238*   Lipid Profile: No results for input(s): CHOL, HDL, LDLCALC, TRIG, CHOLHDL, LDLDIRECT in the last 72 hours. Thyroid Function Tests: No results for input(s): TSH, T4TOTAL, FREET4, T3FREE, THYROIDAB in the last 72 hours. Anemia Panel: No results for input(s): VITAMINB12, FOLATE, FERRITIN, TIBC, IRON, RETICCTPCT in the last 72 hours. Sepsis Labs: No results for input(s): PROCALCITON, LATICACIDVEN in the last 168 hours.  Recent Results (from the past 240 hour(s))  Urine Culture     Status: None   Collection Time: 11/03/17  1:07 AM  Result Value Ref Range Status   Specimen Description   Final    URINE, CATHETERIZED Performed at [facility] [address] [place], [place] [address]    Special Requests NONE  Final   Culture   Final    NO GROWTH Performed at [facility], [place] [address], [place], [place] [address]    Report Status 11/04/2017 FINAL  Final  MRSA PCR Screening     Status: None   Collection Time: 11/03/17 10:49 AM  Result Value Ref Range Status   MRSA by PCR NEGATIVE NEGATIVE Final    Comment:        The GeneXpert MRSA Assay (FDA approved for NASAL specimens only),  is one component of a comprehensive MRSA colonization surveillance program. It is not intended to diagnose MRSA infection nor to guide or monitor treatment for MRSA infections. Performed at [facility], [place] [address] [place], [place] [address]          Radiology Studies: Dg Abd 1 View  Result Date: 11/07/2017 CLINICAL DATA:  84 year old female with feeding tube placement. EXAM: ABDOMEN - 1 VIEW COMPARISON:  Earlier abdominal radiograph dated 11/07/2017 FINDINGS: No significant change in the appearance or positioning of the enteric tube which appears somewhat folded, likely in the mid to distal stomach. IMPRESSION: No significant interval change. Electronically Signed   By: Anner Crete M.D.   On: 11/07/2017 23:00   Dg Chest Port 1 View  Result Date: 11/08/2017 CLINICAL DATA:  Respiratory failure. EXAM: PORTABLE CHEST 1 VIEW COMPARISON:  11/07/2017 and older exams. FINDINGS: Mild enlargement of the cardiopericardial silhouette, stable. Prominent bronchovascular markings, with additional lung base opacity, the latter consistent with atelectasis, also without change from the previous day's exam. New nasal/orogastric tube passes below the included field of view, likely into the stomach. Right subclavian dual lumen central venous catheter is stable, tip in the right atrium. No pneumothorax. IMPRESSION: 1. No significant change in lung aeration from the previous day's study. There are prominent bronchovascular markings and lung base atelectasis, but no convincing pulmonary edema or pneumonia. 2. New nasal/orogastric tube. Tip not visualized, but likely extends into the stomach. Electronically Signed   By: Lajean Manes M.D.   On: 11/08/2017 07:13   Dg Chest Port 1 View  Result Date: 11/07/2017 CLINICAL DATA:  Acute respiratory failure with hypoxia. EXAM: PORTABLE CHEST 1 VIEW COMPARISON:  10/31/2017. FINDINGS: Cardiomegaly. Improved lung volumes. Increased perihilar  markings could represent early edema or viral pneumonitis. Other than improved lung markings, similar appearance to priors. Dialysis catheter has been inserted via RIGHT IJ approach. Tip slide in the RIGHT atrium. There is no pneumothorax. IMPRESSION: Satisfactory catheter placement. No significant change in the appearance of the lungs except for improved volume. Cardiomegaly. Electronically Signed   By: Staci Righter M.D.   On: 11/07/2017 11:30  Dg Abd Portable 1v  Result Date: 11/07/2017 CLINICAL DATA:  Nasogastric tube placement. EXAM: PORTABLE ABDOMEN - 1 VIEW COMPARISON:  11/03/2017 FINDINGS: Interval nasogastric tube folded back upon itself in the mid to distal stomach with its tip in the mid stomach. The included bowel gas pattern is normal. Prominent interstitial markings at the left lung base. Lower thoracic spine degenerative changes. IMPRESSION: Nasogastric tube folded back upon itself in the mid to distal stomach with its tip in the mid stomach. Electronically Signed   By: Claudie Revering M.D.   On: 11/07/2017 13:13        Scheduled Meds: . sodium chloride   Intravenous Once  . Chlorhexidine Gluconate Cloth  6 each Topical Daily  . feeding supplement (PRO-STAT SUGAR FREE 64)  30 mL Per Tube Daily  . insulin aspart  0-5 Units Subcutaneous Q4H  . mouth rinse  15 mL Mouth Rinse BID  . sodium chloride flush  10-40 mL Intracatheter Q12H   Continuous Infusions: .  prismasol BGK 4/2.5 500 mL/hr at 11/09/17 0835  .  prismasol BGK 4/2.5 300 mL/hr at 11/08/17 2116  . sodium chloride Stopped (11/09/17 0025)  . famotidine (PEPCID) IV Stopped (11/08/17 1849)  . feeding supplement (VITAL AF 1.2 CAL) 1,000 mL (11/09/17 0845)  . heparin 999 mL/hr at 11/08/17 2200  . prismasol BGK 4/2.5 1,500 mL/hr at 11/09/17 0827     LOS: 9 days    Time spent: [place], MD [facility]  If 7PM-7AM, please contact night-coverage [email or web address] Password [email or web address] 11/09/2017, 9:59 AM

## 2017-11-09 NOTE — Progress Notes (Signed)
[facility] ROUNDING NOTE   Subjective:   Continues on CRRT continues in pain long conversation with family willing to stop CRRT tomorrow family do not want to rule out dialysis again and are interested in pursuing intermittent hemodialysis should she continue to have renal failure despite stopping CRRT.  I explained to them that she would need transfer to [facility] in the situation.  Blood pressure 131/63 pulse 97 sats 96% nasal cannula 1 L  Sodium 137 potassium 3.9 chloride 105 CO2 24 BUN 17 creatinine 1.48 calcium 7.7 albumin 2.8 magnesium 2.5 hemoglobin 7.4 white blood count 7.4  Platelets 117   Objective:  Vital signs in last 24 hours:  Temp:  [98 F (36.7 C)-99.2 F (37.3 C)] 98.5 F (36.9 C) (11/01 1202) Pulse Rate:  [86-99] 97 (11/01 1700) Resp:  [22-31] 31 (11/01 1700) BP: (97-133)/(20-80) 131/63 (11/01 1700) SpO2:  [93 %-100 %] 96 % (11/01 1700) Weight:  [109.5 kg] 109.5 kg (11/01 0539)  Weight change: -7.4 kg Filed Weights   11/06/17 0400 11/07/17 0800 11/09/17 0539  Weight: 119.4 kg 116.9 kg 109.5 kg    Intake/Output: I/O last 3 completed shifts: In: 1458.3 [I.V.:254.3; Other:104; NG/GT:1050; IV Piggyback:50] Out: 9562 [Urine:67; Other:4548]   Intake/Output this shift:  Total I/O In: 880 [Other:30; NG/GT:850] Out: 1196 [Other:1196]  CVS- RRR murmurs rubs or gallops RS- CTA diminished at bases ABD- BS present soft non-distended proactive bowel sounds  EXT-chubby appearing lower extremities   Basic Metabolic Panel: Recent Labs  Lab 11/05/17 0351  11/06/17 0416  11/07/17 0533 11/07/17 1600 11/08/17 0625 11/08/17 1659 11/09/17 0400 11/09/17 1603  NA 138   < > 138   < > 137 138 138 139 136 137  K 4.0   < > 4.3   < > 4.2 4.1 4.3 4.1 3.9 3.9  CL 98   < > 103   < > 105 106 104 107 104 105  CO2 29   < > 24   < > 23 22 22 23 25 24   GLUCOSE 94   < > 106*   < > 106* 118* 179* 212* 207* 240*  BUN 33*   < > 22   < > 16 14 13 15 15 17    CREATININE 2.60*   < > 1.93*   < > 1.70* 1.62* 1.55* 1.53* 1.58* 1.48*  CALCIUM 7.2*   < > 7.2*   < > 7.3* 7.2* 7.7* 7.6* 7.5* 7.7*  MG 2.5*  --  2.5*  --  2.7*  --  2.4  --  2.5*  --   PHOS 4.1   < > 3.7   < > 3.1 3.1 3.0 2.5 2.3* 1.9*   < > = values in this interval not displayed.    Liver Function Tests: Recent Labs  Lab 11/07/17 1600 11/08/17 0625 11/08/17 1659 11/09/17 0400 11/09/17 1603  ALBUMIN 2.7* 2.6* 2.6* 2.7* 2.8*   No results for input(s): LIPASE, AMYLASE in the last 168 hours. No results for input(s): AMMONIA in the last 168 hours.  CBC: Recent Labs  Lab 11/06/17 0416 11/06/17 1827 11/07/17 0533 11/08/17 0625  WBC 8.5  --  6.6 7.4  HGB 7.0* 7.4* 7.5* 7.4*  HCT 25.3* 26.4* 26.4* 26.3*  MCV 71.9*  --  75.2* 76.2*  PLT 143*  --  131* 117*    Cardiac Enzymes: No results for input(s): CKTOTAL, CKMB, CKMBINDEX, TROPONINI in the last 168 hours.  BNP: Invalid input(s): POCBNP  CBG: Recent  Labs  Lab 11/08/17 2344 11/09/17 0402 11/09/17 0733 11/09/17 1132 11/09/17 1606  GLUCAP 236* 209* 238* 216* 210*    Microbiology: Results for orders placed or performed during the hospital encounter of 10/30/17  Gastrointestinal Panel by PCR , Stool     Status: None   Collection Time: 10/30/17  9:59 AM  Result Value Ref Range Status   Campylobacter species NOT DETECTED NOT DETECTED Final   Plesimonas shigelloides NOT DETECTED NOT DETECTED Final   Salmonella species NOT DETECTED NOT DETECTED Final   Yersinia enterocolitica NOT DETECTED NOT DETECTED Final   Vibrio species NOT DETECTED NOT DETECTED Final   Vibrio cholerae NOT DETECTED NOT DETECTED Final   Enteroaggregative E coli (EAEC) NOT DETECTED NOT DETECTED Final   Enteropathogenic E coli (EPEC) NOT DETECTED NOT DETECTED Final   Enterotoxigenic E coli (ETEC) NOT DETECTED NOT DETECTED Final   Shiga like toxin producing E coli (STEC) NOT DETECTED NOT DETECTED Final   Shigella/Enteroinvasive E coli (EIEC) NOT  DETECTED NOT DETECTED Final   Cryptosporidium NOT DETECTED NOT DETECTED Final   Cyclospora cayetanensis NOT DETECTED NOT DETECTED Final   Entamoeba histolytica NOT DETECTED NOT DETECTED Final   Giardia lamblia NOT DETECTED NOT DETECTED Final   Adenovirus F40/41 NOT DETECTED NOT DETECTED Final   Astrovirus NOT DETECTED NOT DETECTED Final   Norovirus GI/GII NOT DETECTED NOT DETECTED Final   Rotavirus A NOT DETECTED NOT DETECTED Final   Sapovirus (I, II, IV, and V) NOT DETECTED NOT DETECTED Final    Comment: Performed at [facility], [address] [place], [place] [address]  C difficile quick scan w PCR reflex     Status: Abnormal   Collection Time: 10/30/17  9:59 AM  Result Value Ref Range Status   C Diff antigen POSITIVE (A) NEGATIVE Final   C Diff toxin NEGATIVE NEGATIVE Final   C Diff interpretation Results are indeterminate. See PCR results.  Final    Comment: Performed at [facility], [place] [address] [place], [place] [address]  C. Diff by PCR, Reflexed     Status: Abnormal   Collection Time: 10/30/17  9:59 AM  Result Value Ref Range Status   Toxigenic C. Difficile by PCR POSITIVE (A) NEGATIVE Final    Comment: Positive for toxigenic C. difficile with little to no toxin production. Only treat if clinical presentation suggests symptomatic illness. Performed at [facility], [place] [address] [place], [place] [address]   Urine Culture     Status: None   Collection Time: 11/03/17  1:07 AM  Result Value Ref Range Status   Specimen Description   Final    URINE, CATHETERIZED Performed at [place] [address], [place], [place] [address]    Special Requests NONE  Final   Culture   Final    NO GROWTH Performed at [facility], [place] [address] [place], [place] [address]    Report Status 11/04/2017 FINAL  Final  MRSA PCR Screening     Status: None   Collection Time: 11/03/17 10:49 AM  Result Value Ref Range Status    MRSA by PCR NEGATIVE NEGATIVE Final    Comment:        The GeneXpert MRSA Assay (FDA approved for NASAL specimens only), is one component of a comprehensive MRSA colonization surveillance program. It is not intended to diagnose MRSA infection nor to guide or monitor treatment for MRSA infections. Performed at [facility], [address] [place], [place] [address]     Coagulation Studies: No results for input(s): LABPROT, INR in the last 72 hours.  Urinalysis: No results for input(s): COLORURINE, LABSPEC, PHURINE, GLUCOSEU, HGBUR, BILIRUBINUR, KETONESUR, PROTEINUR, UROBILINOGEN, NITRITE, LEUKOCYTESUR in the last 72 hours.  Invalid input(s): APPERANCEUR    Imaging: Dg Abd 1 View  Result Date: 11/07/2017 CLINICAL DATA:  84 year old female with feeding tube placement. EXAM: ABDOMEN - 1 VIEW COMPARISON:  Earlier abdominal radiograph dated 11/07/2017 FINDINGS: No significant change in the appearance or positioning of the enteric tube which appears somewhat folded, likely in the mid to distal stomach. IMPRESSION: No significant interval change. Electronically Signed   By: Anner Crete M.D.   On: 11/07/2017 23:00   Dg Chest Port 1 View  Result Date: 11/08/2017 CLINICAL DATA:  Respiratory failure. EXAM: PORTABLE CHEST 1 VIEW COMPARISON:  11/07/2017 and older exams. FINDINGS: Mild enlargement of the cardiopericardial silhouette, stable. Prominent bronchovascular markings, with additional lung base opacity, the latter consistent with atelectasis, also without change from the previous day's exam. New nasal/orogastric tube passes below the included field of view, likely into the stomach. Right subclavian dual lumen central venous catheter is stable, tip in the right atrium. No pneumothorax. IMPRESSION: 1. No significant change in lung aeration from the previous day's study. There are prominent bronchovascular markings and lung base atelectasis, but no convincing pulmonary  edema or pneumonia. 2. New nasal/orogastric tube. Tip not visualized, but likely extends into the stomach. Electronically Signed   By: Lajean Manes M.D.   On: 11/08/2017 07:13     Medications:   .  prismasol BGK 4/2.5 500 mL/hr at 11/09/17 0835  .  prismasol BGK 4/2.5 300 mL/hr at 11/08/17 2116  . sodium chloride Stopped (11/09/17 0025)  . famotidine (PEPCID) IV Stopped (11/08/17 1849)  . feeding supplement (VITAL AF 1.2 CAL) 1,000 mL (11/09/17 0845)  . heparin 999 mL/hr at 11/08/17 2200  . prismasol BGK 4/2.5 1,500 mL/hr at 11/09/17 1500   . sodium chloride   Intravenous Once  . Chlorhexidine Gluconate Cloth  6 each Topical Daily  . feeding supplement (PRO-STAT SUGAR FREE 64)  30 mL Per Tube Daily  . insulin aspart  0-9 Units Subcutaneous Q4H  . mouth rinse  15 mL Mouth Rinse BID  . sodium chloride flush  10-40 mL Intracatheter Q12H   acetaminophen, fentaNYL (SUBLIMAZE) injection, heparin, heparin, ipratropium-albuterol, metoprolol tartrate, sodium chloride flush  Assessment/ Plan:   1. AKIin setting of volume depletion- presumably ischemic ATN, oliguric. Unfortunately her volume status was worsening as well as her metabolic acidosis so she was started on CVVHD on 11/03/17. Electrolytes have improved, however she remains oliguric/anuricdespite IV lasix.Have discontinued IV Lasix  1. CVVHD   Blood pressure now decreasing we will stop ultrafiltration and keep even 2. Replacement fluids and dialysate all prismasate 4K/2.5Ca and is receiving heparin.  Platelet count is stable 2. Volume/HTN-keep even on dialysis 3. Colitis- with diarrhea onIVceftriaxone and flagyl 4. Metabolic acidosis due to #6,RCV overcorrected with CVVHD. Will change replacement fluids to 4K/2.5Ca and follow. 5. Microcytic anemia- transfuse prn 6. DM type 2 7. HTN- off meds due to hypotension and volume depletion. 8. AMS-appears to be slightly improved but continued with pain 9. Palliative medicine long  discussion with family.  Lattie Haw and husband.  We have outlined the plan that will involve discontinuation of dialysis support at this time and continue to follow-up with close attention to avoidance of nephrotoxins, contrast, maintenance of euvolemia and follow-up  of electrolyte and acid-base balance.  Transferred to [facility] was also discussed for continuation of intermittent hemodialysis, should the necessity arise   LOS: [place] @TODAY @5 :13 PM

## 2017-11-09 NOTE — Progress Notes (Signed)
Patient agitated and yelling out take them off, cursing at spouse. Unable to redirect patient. Notified Bodenheimer NP, new order received.

## 2017-11-09 NOTE — Progress Notes (Signed)
CRRT set was stopped at 1826 d/t rising pressures. Set changed and reinitiated at 1900.

## 2017-11-09 NOTE — Care Management Note (Signed)
Case Management Note  Patient Details  Name: Lauren Crosby MRN: [id number] Date of Birth: [birth year]  Subjective/Objective:      Continues on CRRT              Action/Plan: Following for cm needs  Expected Discharge Date:  (unknown)               Expected Discharge Plan:  Home/Self Care  In-House Referral:     Discharge planning Services  CM Consult  Post Acute Care Choice:    Choice offered to:     DME Arranged:    DME Agency:     HH Arranged:    HH Agency:     Status of Service:  In process, will continue to follow  If discussed at Long Length of Stay Meetings, dates discussed:    Additional Comments:  Leeroy Cha, RN 11/09/2017, 9:38 AM

## 2017-11-09 NOTE — Progress Notes (Signed)
Patient pulled panda tube out at 1845. RN was at bedside and did not witness an intact metal tip upon removal. Dr. Twana First made aware. STAT orders placed to follow up with KUB/CXR to learn the location of the metal tip. Family made aware of the situation. SZP submitted. Report given to oncoming RN. Awaiting x-ray results.

## 2017-11-09 NOTE — Progress Notes (Signed)
At [phone] CRRT was stopped and the blood was returned to the patient when this RN noted TMP and Pressure Drop levels significantly increasing. A new filter was primed with heparinized saline and CRRT was resumed at 0723 without incident.

## 2017-11-10 LAB — COMPREHENSIVE METABOLIC PANEL
ALT: 22 U/L (ref 0–44)
AST: 35 U/L (ref 15–41)
Albumin: 2.7 g/dL — ABNORMAL LOW (ref 3.5–5.0)
Alkaline Phosphatase: 101 U/L (ref 38–126)
Anion gap: 9 (ref 5–15)
BUN: 16 mg/dL (ref 8–23)
CO2: 25 mmol/L (ref 22–32)
Calcium: 7.7 mg/dL — ABNORMAL LOW (ref 8.9–10.3)
Creatinine, Ser: 1.36 mg/dL — ABNORMAL HIGH (ref 0.44–1.00)
Glucose, Bld: 188 mg/dL — ABNORMAL HIGH (ref 70–99)
Sodium: 136 mmol/L (ref 135–145)
Total Protein: 6.3 g/dL — ABNORMAL LOW (ref 6.5–8.1)

## 2017-11-10 LAB — RENAL FUNCTION PANEL
ALBUMIN: 2.7 g/dL — AB (ref 3.5–5.0)
ALBUMIN: 2.5 g/dL — AB (ref 3.5–5.0)
ANION GAP: 8 (ref 5–15)
ANION GAP: 8 (ref 5–15)
BUN: 16 mg/dL (ref 8–23)
BUN: 19 mg/dL (ref 8–23)
CALCIUM: 7.9 mg/dL — AB (ref 8.9–10.3)
CO2: 26 mmol/L (ref 22–32)
CO2: 25 mmol/L (ref 22–32)
CREATININE: 1.41 mg/dL — AB (ref 0.44–1.00)
Calcium: 8 mg/dL — ABNORMAL LOW (ref 8.9–10.3)
Chloride: 105 mmol/L (ref 98–111)
Chloride: 107 mmol/L (ref 98–111)
Creatinine, Ser: 1.86 mg/dL — ABNORMAL HIGH (ref 0.44–1.00)
GFR calc Af Amer: 38 mL/min — ABNORMAL LOW (ref >60)
GFR calc non Af Amer: 33 mL/min — ABNORMAL LOW (ref >60)
GFR calc non Af Amer: 24 mL/min — ABNORMAL LOW (ref >60)
GFR, EST AFRICAN AMERICAN: 28 mL/min — AB (ref >60)
GLUCOSE: 195 mg/dL — AB (ref 70–99)
GLUCOSE: 174 mg/dL — AB (ref 70–99)
PHOSPHORUS: 2.3 mg/dL — AB (ref 2.5–4.6)
PHOSPHORUS: 3 mg/dL (ref 2.5–4.6)
Potassium: 3.9 mmol/L (ref 3.5–5.1)
Potassium: 4.1 mmol/L (ref 3.5–5.1)
SODIUM: 139 mmol/L (ref 135–145)
SODIUM: 140 mmol/L (ref 135–145)

## 2017-11-10 LAB — GLUCOSE, CAPILLARY
GLUCOSE-CAPILLARY: 160 mg/dL — AB (ref 70–99)
GLUCOSE-CAPILLARY: 166 mg/dL — AB (ref 70–99)
Glucose-Capillary: 174 mg/dL — ABNORMAL HIGH (ref 70–99)
Glucose-Capillary: 184 mg/dL — ABNORMAL HIGH (ref 70–99)
Glucose-Capillary: 166 mg/dL — ABNORMAL HIGH (ref 70–99)

## 2017-11-10 LAB — CBC
HCT: 25.6 % — ABNORMAL LOW (ref 36.0–46.0)
Hemoglobin: 7.3 g/dL — ABNORMAL LOW (ref 12.0–15.0)
MCH: 21.4 pg — ABNORMAL LOW (ref 26.0–34.0)
MCHC: 28.5 g/dL — ABNORMAL LOW (ref 30.0–36.0)
MCV: 75.1 fL — ABNORMAL LOW (ref 80.0–100.0)
Platelets: 115 K/uL — ABNORMAL LOW (ref 150–400)
RBC: 3.41 MIL/uL — ABNORMAL LOW (ref 3.87–5.11)
RDW: 23.7 % — ABNORMAL HIGH (ref 11.5–15.5)
WBC: 8.1 10*3/uL (ref 4.0–10.5)
nRBC: 0.7 % — ABNORMAL HIGH (ref 0.0–0.2)

## 2017-11-10 LAB — COMPREHENSIVE METABOLIC PANEL WITH GFR
Chloride: 102 mmol/L (ref 98–111)
GFR calc Af Amer: 40 mL/min — ABNORMAL LOW (ref >60)
GFR calc non Af Amer: 35 mL/min — ABNORMAL LOW (ref >60)
Potassium: 3.9 mmol/L (ref 3.5–5.1)
Total Bilirubin: 0.7 mg/dL (ref 0.3–1.2)

## 2017-11-10 LAB — PHOSPHORUS: Phosphorus: 2.3 mg/dL — ABNORMAL LOW (ref 2.5–4.6)

## 2017-11-10 LAB — MAGNESIUM: Magnesium: 2.4 mg/dL (ref 1.7–2.4)

## 2017-11-10 MED ORDER — MIDODRINE HCL 5 MG PO TABS
5.0000 mg | ORAL_TABLET | Freq: Three times a day (TID) | ORAL | Status: DC
  Administered 2017-11-11 – 2017-11-16 (×18): 5 mg via ORAL
  Filled 2017-11-10 (×17): qty 1

## 2017-11-10 MED ORDER — HYDROMORPHONE HCL 1 MG/ML IJ SOLN
0.5000 mg | Freq: Once | INTRAMUSCULAR | Status: AC
  Administered 2017-11-10: 0.5 mg via INTRAVENOUS
  Filled 2017-11-10: qty 1

## 2017-11-10 MED ORDER — FENTANYL CITRATE (PF) 100 MCG/2ML IJ SOLN
50.0000 ug | INTRAMUSCULAR | Status: DC | PRN
  Administered 2017-11-10 – 2017-11-12 (×9): 50 ug via INTRAVENOUS
  Filled 2017-11-10 (×10): qty 2

## 2017-11-10 NOTE — Progress Notes (Signed)
CRRT set stopped at 0400 d/t rising pressures. Set Changed and reinitiated at at 0430.

## 2017-11-10 NOTE — Progress Notes (Signed)
BLAIZE NIPPER [id number] Admission Data: 11/10/2017 7:45 PM Attending Provider: Kayleen Memos, DO  [name], Lauren Main, MD Consults/ Treatment Team: Treatment Team:  Lauren Agent, MD  Lauren Crosby is a 84 y.o. female patient admitted from [place] long  alert  To self ,  DNR, VSS - Blood pressure (!) 126/41, pulse 94, temperature 98.7 F (37.1 C), temperature source Axillary, resp. rate (!) 26, height 5\' 2"  (1.575 m), weight 108.3 kg, SpO2 100 %., O2    1 L nasal cannular, no c/o shortness of breath, no c/o chest pain, no distress noted. Tele # [id number] placed and pt is currently running:normal sinus rhythm.   Allergies:   Allergies  Allergen Reactions  . Penicillins Other (See Comments)    Has patient had a PCN reaction causing immediate rash, facial/tongue/throat swelling, SOB or lightheadedness with hypotension: No Has patient had a PCN reaction causing severe rash involving mucus membranes or skin necrosis: No Has patient had a PCN reaction that required hospitalization: No Has patient had a PCN reaction occurring within the last 10 years: Unknown If all of the above answers are "NO", then may proceed with Cephalosporin use.     Past Medical History:  Diagnosis Date  . Adrenal adenoma   . Anemia   . Arthritis    OA AND PAIN BOTH KNEES BUT RIGHT KNEE PAIN WORSE; SOME LOWER BACK PAIN  . Cellulitis and abscess of trunk   . Colon polyp    adenomatous  . Diabetes ([place])    type 2  PT IS ON ORAL MEDICATION AND INSULIN  . Diverticulitis   . GERD (gastroesophageal reflux disease)    NO MEDS FOR GERD  . Goiter   . Gout   . Hepatitis    YELLOW JAUNDICE WHEN A CHILD  . Hernia   . Hyperlipemia   . Hypertension   . Nephrolithiasis   . 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 Obesity   . Open wound of abdominal wall, anterior, without mention of complication   . Pneumonia    IN THE PAST  . Retinopathy due to secondary diabetes mellitus ([place])   . Shortness of  breath    PT IS NOT ABLE TO BE ACTIVE BECAUSE OF KNEE PROBLEM; HAS ALWAYS HAD PROBLEM WITH BREATHING HEAVY - DOES GET SOB WITH EXERTION.  Marland Kitchen Thalassemia minor    Admission INP armband ID verified with patient/family, and in place. SR up x 2, fall risk assessment complete.  Will cont to monitor and assist as needed.  Lauren N Arun Herrod, RN 11/10/2017 7:45 PM

## 2017-11-10 NOTE — Progress Notes (Signed)
CRRT stopped due to line clotting unable to return blood.  Dr. Justin Mend updated and RN directed to stop CRRT at this time.

## 2017-11-10 NOTE — Progress Notes (Signed)
[facility] ROUNDING NOTE   Subjective:   Discontinue CRRT.  Filter clotting.  Patient more alert squeezing hand to command.  Wants to continue with dialysis  Blood pressure 99/41 pulse 93 temperature 97.7 removal 2816 cc weight decreased from 124.1 kg to 108.3 kg  Sodium 139 potassium 3.9 chloride 105 CO2 26 BUN 16 creatinine 1.4 glucose 195 phosphorus 2.3 magnesium 2.4 albumin 2.7 AST 35 ALT 22 bilirubin 0.7 WBC 8.1 hemoglobin 7.3 platelets 115  O2 sats 98% 1 L nasal cannula  Chest x-ray poor inspiratory effort no acute abnormality noted.  Objective:  Vital signs in last 24 hours:  Temp:  [97.7 F (36.5 C)-99.2 F (37.3 C)] 97.7 F (36.5 C) (11/02 0814) Pulse Rate:  [91-100] 93 (11/02 1130) Resp:  [14-35] 16 (11/02 1130) BP: (70-139)/(15-98) 99/41 (11/02 1130) SpO2:  [93 %-100 %] 99 % (11/02 1130) Weight:  [108.3 kg] 108.3 kg (11/02 0605)  Weight change: -1.2 kg Filed Weights   11/07/17 0800 11/09/17 0539 11/10/17 0605  Weight: 116.9 kg 109.5 kg 108.3 kg    Intake/Output: I/O last 3 completed shifts: In: 1911.1 [I.V.:167.1; Other:94; NG/GT:1550; IV Piggyback:100] Out: 7341 [Urine:37; Other:3593]   Intake/Output this shift:  Total I/O In: 40 [I.V.:40] Out: 179 [Other:179]  CVS- RRR murmurs rubs or gallops RS- CTA diminished at bases ABD- BS present soft non-distended proactive bowel sounds  EXT-chubby appearing lower extremities   Basic Metabolic Panel: Recent Labs  Lab 11/06/17 0416  11/07/17 0533  11/08/17 0625 11/08/17 1659 11/09/17 0400 11/09/17 1603 11/10/17 0540  NA 138   < > 137   < > 138 139 136 137 139  136  K 4.3   < > 4.2   < > 4.3 4.1 3.9 3.9 3.9  3.9  CL 103   < > 105   < > 104 107 104 105 105  102  CO2 24   < > 23   < > 22 23 25 24 26  25   GLUCOSE 106*   < > 106*   < > 179* 212* 207* 240* 195*  188*  BUN 22   < > 16   < > 13 15 15 17 16  16   CREATININE 1.93*   < > 1.70*   < > 1.55* 1.53* 1.58* 1.48* 1.41*  1.36*   CALCIUM 7.2*   < > 7.3*   < > 7.7* 7.6* 7.5* 7.7* 7.9*  7.7*  MG 2.5*  --  2.7*  --  2.4  --  2.5*  --  2.4  PHOS 3.7   < > 3.1   < > 3.0 2.5 2.3* 1.9* 2.3*  2.3*   < > = values in this interval not displayed.    Liver Function Tests: Recent Labs  Lab 11/08/17 0625 11/08/17 1659 11/09/17 0400 11/09/17 1603 11/10/17 0540  AST  --   --   --   --  35  ALT  --   --   --   --  22  ALKPHOS  --   --   --   --  101  BILITOT  --   --   --   --  0.7  PROT  --   --   --   --  6.3*  ALBUMIN 2.6* 2.6* 2.7* 2.8* 2.7*  2.7*   No results for input(s): LIPASE, AMYLASE in the last 168 hours. No results for input(s): AMMONIA in the last 168 hours.  CBC: Recent Labs  Lab 11/06/17 0416 11/06/17 1827 11/07/17 0533 11/08/17 0625 11/10/17 0540  WBC 8.5  --  6.6 7.4 8.1  HGB 7.0* 7.4* 7.5* 7.4* 7.3*  HCT 25.3* 26.4* 26.4* 26.3* 25.6*  MCV 71.9*  --  75.2* 76.2* 75.1*  PLT 143*  --  131* 117* 115*    Cardiac Enzymes: No results for input(s): CKTOTAL, CKMB, CKMBINDEX, TROPONINI in the last 168 hours.  BNP: Invalid input(s): POCBNP  CBG: Recent Labs  Lab 11/09/17 2018 11/09/17 2353 11/10/17 0445 11/10/17 0753 11/10/17 1217  GLUCAP 224* 206* 174* 184* 166*    Microbiology: Results for orders placed or performed during the hospital encounter of 10/30/17  Gastrointestinal Panel by PCR , Stool     Status: None   Collection Time: 10/30/17  9:59 AM  Result Value Ref Range Status   Campylobacter species NOT DETECTED NOT DETECTED Final   Plesimonas shigelloides NOT DETECTED NOT DETECTED Final   Salmonella species NOT DETECTED NOT DETECTED Final   Yersinia enterocolitica NOT DETECTED NOT DETECTED Final   Vibrio species NOT DETECTED NOT DETECTED Final   Vibrio cholerae NOT DETECTED NOT DETECTED Final   Enteroaggregative E coli (EAEC) NOT DETECTED NOT DETECTED Final   Enteropathogenic E coli (EPEC) NOT DETECTED NOT DETECTED Final   Enterotoxigenic E coli (ETEC) NOT DETECTED NOT  DETECTED Final   Shiga like toxin producing E coli (STEC) NOT DETECTED NOT DETECTED Final   Shigella/Enteroinvasive E coli (EIEC) NOT DETECTED NOT DETECTED Final   Cryptosporidium NOT DETECTED NOT DETECTED Final   Cyclospora cayetanensis NOT DETECTED NOT DETECTED Final   Entamoeba histolytica NOT DETECTED NOT DETECTED Final   Giardia lamblia NOT DETECTED NOT DETECTED Final   Adenovirus F40/41 NOT DETECTED NOT DETECTED Final   Astrovirus NOT DETECTED NOT DETECTED Final   Norovirus GI/GII NOT DETECTED NOT DETECTED Final   Rotavirus A NOT DETECTED NOT DETECTED Final   Sapovirus (I, II, IV, and V) NOT DETECTED NOT DETECTED Final    Comment: Performed at [facility], [address] [place], [place] [address]  C difficile quick scan w PCR reflex     Status: Abnormal   Collection Time: 10/30/17  9:59 AM  Result Value Ref Range Status   C Diff antigen POSITIVE (A) NEGATIVE Final   C Diff toxin NEGATIVE NEGATIVE Final   C Diff interpretation Results are indeterminate. See PCR results.  Final    Comment: Performed at [facility], [place] [address], [place], [place] [address]  C. Diff by PCR, Reflexed     Status: Abnormal   Collection Time: 10/30/17  9:59 AM  Result Value Ref Range Status   Toxigenic C. Difficile by PCR POSITIVE (A) NEGATIVE Final    Comment: Positive for toxigenic C. difficile with little to no toxin production. Only treat if clinical presentation suggests symptomatic illness. Performed at [facility], [place] [address], [place], [place] [address]   Urine Culture     Status: None   Collection Time: 11/03/17  1:07 AM  Result Value Ref Range Status   Specimen Description   Final    URINE, CATHETERIZED Performed at [place] [address] [place], [place] [address]    Special Requests NONE  Final   Culture   Final    NO GROWTH Performed at [facility], [place] [address], [place], [place] [address]    Report  Status 11/04/2017 FINAL  Final  MRSA PCR Screening  Status: None   Collection Time: 11/03/17 10:49 AM  Result Value Ref Range Status   MRSA by PCR NEGATIVE NEGATIVE Final    Comment:        The GeneXpert MRSA Assay (FDA approved for NASAL specimens only), is one component of a comprehensive MRSA colonization surveillance program. It is not intended to diagnose MRSA infection nor to guide or monitor treatment for MRSA infections. Performed at [facility], [place] [address], [place], [place] [address]     Coagulation Studies: No results for input(s): LABPROT, INR in the last 72 hours.  Urinalysis: No results for input(s): COLORURINE, LABSPEC, PHURINE, GLUCOSEU, HGBUR, BILIRUBINUR, KETONESUR, PROTEINUR, UROBILINOGEN, NITRITE, LEUKOCYTESUR in the last 72 hours.  Invalid input(s): APPERANCEUR    Imaging: Dg Abd 1 View  Result Date: 11/09/2017 CLINICAL DATA:  Patient pulled out nasogastric catheter EXAM: ABDOMEN - 1 VIEW COMPARISON:  11/07/2017 FINDINGS: Scattered large and small bowel gas is noted. The previously seen nasogastric catheter has been removed in the interval. Despite the given clinical history of metal tipped catheter this catheter on previous images represented a standard nasogastric catheter without metallic weighting. No foreign body is noted. IMPRESSION: No evidence of retained foreign body as the prior nasogastric catheter was not a weighted tipped catheter based on previous images. Electronically Signed   By: Inez Catalina M.D.   On: 11/09/2017 19:57   Dg Chest Port 1 View  Result Date: 11/09/2017 CLINICAL DATA:  Patient removed recent nasogastric catheter EXAM: PORTABLE CHEST 1 VIEW COMPARISON:  11/08/2017 FINDINGS: Cardiac shadow is enlarged. Right jugular central line is again seen and stable. Inspiratory effort is poor with crowding of the vascular markings. No focal confluent infiltrate is seen. No radiopaque foreign body is noted. The  previously seen nasogastric catheter has been removed and as previously described was not a weighted tipped catheter. IMPRESSION: Poor inspiratory effort.  No acute abnormality noted. Electronically Signed   By: Inez Catalina M.D.   On: 11/09/2017 19:58     Medications:   .  prismasol BGK 4/2.5 500 mL/hr at 11/09/17 1849  .  prismasol BGK 4/2.5 300 mL/hr at 11/10/17 0909  . sodium chloride 10 mL/hr at 11/10/17 1100  . famotidine (PEPCID) IV Stopped (11/09/17 1840)  . feeding supplement (VITAL AF 1.2 CAL) Stopped (11/09/17 2000)  . heparin 999 mL/hr at 11/10/17 0430  . prismasol BGK 4/2.5 1,500 mL/hr at 11/10/17 0908   . sodium chloride   Intravenous Once  . Chlorhexidine Gluconate Cloth  6 each Topical Daily  . feeding supplement (PRO-STAT SUGAR FREE 64)  30 mL Per Tube Daily  . insulin aspart  0-9 Units Subcutaneous Q4H  . mouth rinse  15 mL Mouth Rinse BID  . midodrine  5 mg Oral TID WC  . sodium chloride flush  10-40 mL Intracatheter Q12H   acetaminophen, fentaNYL (SUBLIMAZE) injection, heparin, heparin, ipratropium-albuterol, metoprolol tartrate, sodium chloride flush  Assessment/ Plan:   1. AKIin setting of volume depletion- presumably ischemic ATN, oliguric. Unfortunately her volume status was worsening as well as her metabolic acidosis so she was started on CVVHD on 11/03/17. Discontinued 11/10/2017.  Transfer patient to [facility] in anticipation of intermittent hemodialysis 2. Volume/HTN-keep even on dialysis we will start Midrin 5 mg 3 times  3. Colitis-C. difficile antibiotics have been discontinued course completed 4. Metabolic acidosis resolved 5. Microcytic anemia- transfuse prn 6. DM type 2 primary team 7. AMS-appears to be slightly improved but continued with pain.  Mental status appears to be improved 8. Palliative medicine prognosis is poor we will transfer to [facility] in anticipation of needing intermittent hemodialysis treatments.  To this will depend  on her blood pressure   LOS: [place] @TODAY @12 :29 PM

## 2017-11-10 NOTE — Progress Notes (Signed)
PMT no charge note  Chart reviewed Overnight events noted Discussed with bedside RNs Patient likely to be transferred to [facility], for continuation of dialysis efforts, likely HD to be started at [facility], as per patient's expressed wishes.   PMT will follow peripherally, and re engage when deemed appropriate by patient and family.   Loistine Chance MD [facility] health palliative medicine team [phone]

## 2017-11-10 NOTE — Evaluation (Signed)
SLP Cancellation Note  Patient Details Name: SOPHIRA RUMLER MRN: [id number] DOB: Dec 29, 1931   Cancelled treatment:       Reason Eval/Treat Not Completed: Other (comment)(pt transferring to [facility], carelink present)  Luanna Salk, [place] [facility] SLP Acute Rehab Services Pager [phone] Office [phone]  Macario Golds 11/10/2017, 4:48 PM

## 2017-11-10 NOTE — Progress Notes (Signed)
PROGRESS NOTE    Lauren Crosby  POE:[id number] DOB: May 26, 1931 DOA: 10/30/2017 PCP: [name], MD    Brief Narrative:  84 year old with past medical history relevant for type 2 diabetes on insulin, 3 CKD, hypertension, hyperlipidemia, chronic bilateral lower extremity weakness, class III obesity admitted on 10/30/2017 with C. difficile colitis and diarrhea and diffuse weakness with a prolonged hospital course complicated by septic shock and AKI requiring initiation of CRRT and ICU stay and encephalopathy.   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 ([place])   Hypertension   Hyperlipemia   ARF (acute renal failure) (HCC)   Acute kidney failure (HCC)   Metabolic acidosis, normal anion gap (NAG)   Abdominal pain   Diarrhea   CKD (chronic kidney disease) stage 3, GFR 30-59 ml/min (HCC)   Colitis, acute   ATN (acute tubular necrosis) (HCC)   Shock circulatory (HCC)   Hyperglycemia due to type 2 diabetes mellitus (HCC)   Encephalopathy acute   Goals of care, counseling/discussion   Acute respiratory failure with hypoxemia ([place])   Acute pulmonary edema (HCC)   Acute encephalopathy   Palliative care by specialist   #) Metabolic encephalopathy: Patient continues to be diffusely encephalopathic complaining of pain and agitated and screaming out.  She is now more awake and alert today. -Palliative care following -We will aggressively treat pain with fentanyl -Patient pulled out tube feedings however she is more awake and alert today and so we will have speech line which pathology evaluate the patient  #) C. difficile colitis, gated by septic shock and AKI with initiation of CRRT: Patient required CRRT initiation on 11/03/2017 for septic shock.  Family is currently deciding between CRRT and palliative care versus transitioning the patient to intermittent hemodialysis at [facility]. -Continue  CRRT, nephrology plans to hold this weekend and see if her urine output improves -Transfer to [name] on 11/12/2017 if family opts to start hemodialysis -Nephrology following appreciate recommendations -Completed course of antibiotics for C. difficile, oral vancomycin and IV metronidazole  #) Type 2 diabetes on insulin: - Hold 70/3045 units every morning and 50 units nightly -Sliding scale insulin every 4 hours  #) Hypertension/hyperlipidemia: -Hold metoprolol tartrate 25 mg twice daily -Hold simvastatin 80 mg nightly  #) Chronic lower extremity edema: -Hold furosemide, dialysis per above  #) Gout:  -Hold allopurinol 300 mg daily  Fluids: Being managed via CRRT Electrolyte: Being managed via CRRT Nutrition: Pulled out tube feeding, pending speech line with pathology evaluation  Prophylaxis: Heparin and CRRT  Disposition: Pending family arriving for discussion of withdrawing care versus transfer to [name] for hemodialysis initiation  DO NOT RESUSCITATE   Consultants:   PCCM  Palliative care  Nephrology  Procedures:   [facility] catheter placement on 11/03/2017  CRRT initiated on 11/03/2017  Antimicrobials:   P.o. vancomycin 10/30/2017 to 11/05/2017  IV metronidazole 10/30/2017 to 11/05/2017  IV ceftriaxone 10/30/2017 to 11/05/2017   Subjective: Patient is more awake and alert today.  She reports that she is in pain.  She can tell me that she is in the hospital and her name.  She knows that she has been quite sick.  She denies any nausea, vomiting, diarrhea, cough, congestion.  Last night she did pull out her NG tube.  Objective: Vitals:   11/10/17 0700 11/10/17 0800 11/10/17 0814 11/10/17 0900  BP: (!) 99/32 (!) 108/35  (!) 111/28  Pulse: 91 93  91  Resp: 14 15  (!) 29  Temp:   97.7 F (36.5 C)   TempSrc:   Oral   SpO2: 100% 99%  98%  Weight:      Height:        Intake/Output Summary (Last 24 hours) at 11/10/2017 0953 Last data filed at 11/10/2017  0900 Gross per 24 hour  Intake 962.79 ml  Output 2111 ml  Net -1148.21 ml   Filed Weights   11/07/17 0800 11/09/17 0539 11/10/17 0605  Weight: 116.9 kg 109.5 kg 108.3 kg    Examination:  General exam: No acute distress Respiratory system: No increased work of breathing, anterior lung sounds clear, no wheezes, rhonchi, rales. Cardiovascular system: Distant heart sounds, regular rate and rhythm, no murmurs. Gastrointestinal system: Soft, mildly distended, no rebound or guarding, plus bowel sounds Central nervous system: Alert to self, place, situation, grossly moving all extremities Extremities:Bilateral chronic lower extremity edema. Skin: , TDC catheter site is clean dry and intact Psychiatry: Unable to assess due to medical condition    Data Reviewed: I have personally reviewed following labs and imaging studies  CBC: Recent Labs  Lab 11/06/17 0416 11/06/17 1827 11/07/17 0533 11/08/17 0625 11/10/17 0540  WBC 8.5  --  6.6 7.4 8.1  HGB 7.0* 7.4* 7.5* 7.4* 7.3*  HCT 25.3* 26.4* 26.4* 26.3* 25.6*  MCV 71.9*  --  75.2* 76.2* 75.1*  PLT 143*  --  131* 117* 353*   Basic Metabolic Panel: Recent Labs  Lab 11/06/17 0416  11/07/17 0533  11/08/17 0625 11/08/17 1659 11/09/17 0400 11/09/17 1603 11/10/17 0540  NA 138   < > 137   < > 138 139 136 137 139  136  K 4.3   < > 4.2   < > 4.3 4.1 3.9 3.9 3.9  3.9  CL 103   < > 105   < > 104 107 104 105 105  102  CO2 24   < > 23   < > 22 23 25 24 26  25   GLUCOSE 106*   < > 106*   < > 179* 212* 207* 240* 195*  188*  BUN 22   < > 16   < > 13 15 15 17 16  16   CREATININE 1.93*   < > 1.70*   < > 1.55* 1.53* 1.58* 1.48* 1.41*  1.36*  CALCIUM 7.2*   < > 7.3*   < > 7.7* 7.6* 7.5* 7.7* 7.9*  7.7*  MG 2.5*  --  2.7*  --  2.4  --  2.5*  --  2.4  PHOS 3.7   < > 3.1   < > 3.0 2.5 2.3* 1.9* 2.3*   < > = values in this interval not displayed.   GFR: Estimated Creatinine Clearance: 34.4 mL/min (A) (by C-G formula based on SCr of 1.41  mg/dL (H)). Liver Function Tests: Recent Labs  Lab 11/08/17 0625 11/08/17 1659 11/09/17 0400 11/09/17 1603 11/10/17 0540  AST  --   --   --   --  35  ALT  --   --   --   --  22  ALKPHOS  --   --   --   --  101  BILITOT  --   --   --   --  0.7  PROT  --   --   --   --  6.3*  ALBUMIN 2.6* 2.6* 2.7* 2.8* 2.7*  2.7*  No results for input(s): LIPASE, AMYLASE in the last 168 hours. No results for input(s): AMMONIA in the last 168 hours. Coagulation Profile: No results for input(s): INR, PROTIME in the last 168 hours. Cardiac Enzymes: No results for input(s): CKTOTAL, CKMB, CKMBINDEX, TROPONINI in the last 168 hours. BNP (last 3 results) No results for input(s): PROBNP in the last 8760 hours. HbA1C: No results for input(s): HGBA1C in the last 72 hours. CBG: Recent Labs  Lab 11/09/17 1606 11/09/17 2018 11/09/17 2353 11/10/17 0445 11/10/17 0753  GLUCAP 210* 224* 206* 174* 184*   Lipid Profile: No results for input(s): CHOL, HDL, LDLCALC, TRIG, CHOLHDL, LDLDIRECT in the last 72 hours. Thyroid Function Tests: No results for input(s): TSH, T4TOTAL, FREET4, T3FREE, THYROIDAB in the last 72 hours. Anemia Panel: No results for input(s): VITAMINB12, FOLATE, FERRITIN, TIBC, IRON, RETICCTPCT in the last 72 hours. Sepsis Labs: No results for input(s): PROCALCITON, LATICACIDVEN in the last 168 hours.  Recent Results (from the past 240 hour(s))  Urine Culture     Status: None   Collection Time: 11/03/17  1:07 AM  Result Value Ref Range Status   Specimen Description   Final    URINE, CATHETERIZED Performed at [facility] [address], [place], [place] [address]    Special Requests NONE  Final   Culture   Final    NO GROWTH Performed at [facility], [place] [address], [place], [place] [address]    Report Status 11/04/2017 FINAL  Final  MRSA PCR Screening     Status: None   Collection Time: 11/03/17 10:49 AM  Result Value Ref Range Status   MRSA by  PCR NEGATIVE NEGATIVE Final    Comment:        The GeneXpert MRSA Assay (FDA approved for NASAL specimens only), is one component of a comprehensive MRSA colonization surveillance program. It is not intended to diagnose MRSA infection nor to guide or monitor treatment for MRSA infections. Performed at [facility], [place] [address] [place], [place] [address]          Radiology Studies: Dg Abd 1 View  Result Date: 11/09/2017 CLINICAL DATA:  Patient pulled out nasogastric catheter EXAM: ABDOMEN - 1 VIEW COMPARISON:  11/07/2017 FINDINGS: Scattered large and small bowel gas is noted. The previously seen nasogastric catheter has been removed in the interval. Despite the given clinical history of metal tipped catheter this catheter on previous images represented a standard nasogastric catheter without metallic weighting. No foreign body is noted. IMPRESSION: No evidence of retained foreign body as the prior nasogastric catheter was not a weighted tipped catheter based on previous images. Electronically Signed   By: Inez Catalina M.D.   On: 11/09/2017 19:57   Dg Chest Port 1 View  Result Date: 11/09/2017 CLINICAL DATA:  Patient removed recent nasogastric catheter EXAM: PORTABLE CHEST 1 VIEW COMPARISON:  11/08/2017 FINDINGS: Cardiac shadow is enlarged. Right jugular central line is again seen and stable. Inspiratory effort is poor with crowding of the vascular markings. No focal confluent infiltrate is seen. No radiopaque foreign body is noted. The previously seen nasogastric catheter has been removed and as previously described was not a weighted tipped catheter. IMPRESSION: Poor inspiratory effort.  No acute abnormality noted. Electronically Signed   By: Inez Catalina M.D.   On: 11/09/2017 19:58        Scheduled Meds: . sodium chloride   Intravenous Once  . Chlorhexidine Gluconate Cloth  6 each Topical Daily  .  feeding supplement (PRO-STAT SUGAR FREE 64)  30 mL Per Tube  Daily  . insulin aspart  0-9 Units Subcutaneous Q4H  . mouth rinse  15 mL Mouth Rinse BID  . sodium chloride flush  10-40 mL Intracatheter Q12H   Continuous Infusions: .  prismasol BGK 4/2.5 500 mL/hr at 11/09/17 1849  .  prismasol BGK 4/2.5 300 mL/hr at 11/10/17 0909  . sodium chloride 10 mL/hr at 11/10/17 0700  . famotidine (PEPCID) IV Stopped (11/09/17 1840)  . feeding supplement (VITAL AF 1.2 CAL) Stopped (11/09/17 2000)  . heparin 999 mL/hr at 11/10/17 0430  . prismasol BGK 4/2.5 1,500 mL/hr at 11/10/17 0908     LOS: 10 days    Time spent: [place], MD [facility]  If 7PM-7AM, please contact night-coverage [email or web address] Password [facility] 11/10/2017, 9:53 AM

## 2017-11-11 DIAGNOSIS — R0603 Acute respiratory distress: Secondary | ICD-10-CM

## 2017-11-11 LAB — RENAL FUNCTION PANEL
ALBUMIN: 2.4 g/dL — AB (ref 3.5–5.0)
ANION GAP: 5 (ref 5–15)
Albumin: 2.4 g/dL — ABNORMAL LOW (ref 3.5–5.0)
Anion gap: 5 (ref 5–15)
BUN: 19 mg/dL (ref 8–23)
BUN: 26 mg/dL — AB (ref 8–23)
CHLORIDE: 108 mmol/L (ref 98–111)
CHLORIDE: 106 mmol/L (ref 98–111)
CO2: 25 mmol/L (ref 22–32)
CO2: 26 mmol/L (ref 22–32)
CREATININE: 2.64 mg/dL — AB (ref 0.44–1.00)
Calcium: 8.3 mg/dL — ABNORMAL LOW (ref 8.9–10.3)
Calcium: 8.3 mg/dL — ABNORMAL LOW (ref 8.9–10.3)
Creatinine, Ser: 2.45 mg/dL — ABNORMAL HIGH (ref 0.44–1.00)
GFR calc Af Amer: 18 mL/min — ABNORMAL LOW (ref >60)
GFR calc non Af Amer: 16 mL/min — ABNORMAL LOW (ref >60)
GFR, EST AFRICAN AMERICAN: 20 mL/min — AB (ref >60)
GFR, EST NON AFRICAN AMERICAN: 17 mL/min — AB (ref >60)
Glucose, Bld: 167 mg/dL — ABNORMAL HIGH (ref 70–99)
Glucose, Bld: 265 mg/dL — ABNORMAL HIGH (ref 70–99)
PHOSPHORUS: 4.6 mg/dL (ref 2.5–4.6)
POTASSIUM: 4 mmol/L (ref 3.5–5.1)
Phosphorus: 3.5 mg/dL (ref 2.5–4.6)
Potassium: 3.9 mmol/L (ref 3.5–5.1)
Sodium: 138 mmol/L (ref 135–145)
Sodium: 137 mmol/L (ref 135–145)

## 2017-11-11 LAB — CBC
HCT: 22.7 % — ABNORMAL LOW (ref 36.0–46.0)
Hemoglobin: 6.6 g/dL — CL (ref 12.0–15.0)
MCH: 21.6 pg — AB (ref 26.0–34.0)
MCHC: 29.1 g/dL — ABNORMAL LOW (ref 30.0–36.0)
MCV: 74.2 fL — ABNORMAL LOW (ref 80.0–100.0)
Platelets: 128 10*3/uL — ABNORMAL LOW (ref 150–400)
RBC: 3.06 MIL/uL — AB (ref 3.87–5.11)
RDW: 23.6 % — ABNORMAL HIGH (ref 11.5–15.5)
WBC: 6.4 10*3/uL (ref 4.0–10.5)
nRBC: 0.9 % — ABNORMAL HIGH (ref 0.0–0.2)

## 2017-11-11 LAB — HEMOGLOBIN AND HEMATOCRIT, BLOOD
HCT: 28.3 % — ABNORMAL LOW (ref 36.0–46.0)
Hemoglobin: 8.6 g/dL — ABNORMAL LOW (ref 12.0–15.0)

## 2017-11-11 LAB — GLUCOSE, CAPILLARY
GLUCOSE-CAPILLARY: 153 mg/dL — AB (ref 70–99)
GLUCOSE-CAPILLARY: 162 mg/dL — AB (ref 70–99)
GLUCOSE-CAPILLARY: 231 mg/dL — AB (ref 70–99)
Glucose-Capillary: 167 mg/dL — ABNORMAL HIGH (ref 70–99)
Glucose-Capillary: 167 mg/dL — ABNORMAL HIGH (ref 70–99)
Glucose-Capillary: 238 mg/dL — ABNORMAL HIGH (ref 70–99)

## 2017-11-11 LAB — PREPARE RBC (CROSSMATCH)

## 2017-11-11 LAB — MAGNESIUM: Magnesium: 2.5 mg/dL — ABNORMAL HIGH (ref 1.7–2.4)

## 2017-11-11 LAB — ABO/RH: ABO/RH(D): A POS

## 2017-11-11 MED ORDER — FERROUS SULFATE 325 (65 FE) MG PO TABS
325.0000 mg | ORAL_TABLET | Freq: Two times a day (BID) | ORAL | Status: DC
  Administered 2017-11-11 – 2017-11-16 (×11): 325 mg via ORAL
  Filled 2017-11-11 (×10): qty 1

## 2017-11-11 MED ORDER — FUROSEMIDE 10 MG/ML IJ SOLN
40.0000 mg | Freq: Once | INTRAMUSCULAR | Status: AC
  Administered 2017-11-11: 40 mg via INTRAVENOUS
  Filled 2017-11-11: qty 4

## 2017-11-11 MED ORDER — NYSTATIN 100000 UNIT/ML MT SUSP
5.0000 mL | Freq: Four times a day (QID) | OROMUCOSAL | Status: DC
  Administered 2017-11-11 – 2017-11-16 (×22): 500000 [IU] via ORAL
  Filled 2017-11-11 (×21): qty 5

## 2017-11-11 MED ORDER — SODIUM CHLORIDE 0.9% IV SOLUTION
Freq: Once | INTRAVENOUS | Status: AC
  Administered 2017-11-11: 10:00:00 via INTRAVENOUS

## 2017-11-11 MED ORDER — PRO-STAT SUGAR FREE PO LIQD
30.0000 mL | Freq: Three times a day (TID) | ORAL | Status: DC
  Administered 2017-11-11 – 2017-11-12 (×4): 30 mL via ORAL
  Filled 2017-11-11 (×4): qty 30

## 2017-11-11 MED ORDER — HEPARIN SODIUM (PORCINE) 5000 UNIT/ML IJ SOLN
5000.0000 [IU] | Freq: Three times a day (TID) | INTRAMUSCULAR | Status: DC
  Administered 2017-11-11 – 2017-11-16 (×16): 5000 [IU] via SUBCUTANEOUS
  Filled 2017-11-11 (×16): qty 1

## 2017-11-11 MED ORDER — RANITIDINE HCL 150 MG/10ML PO SYRP
150.0000 mg | ORAL_SOLUTION | Freq: Two times a day (BID) | ORAL | Status: DC
  Administered 2017-11-11 – 2017-11-15 (×9): 150 mg via ORAL
  Filled 2017-11-11 (×10): qty 10

## 2017-11-11 NOTE — Evaluation (Signed)
Clinical/Bedside Swallow Evaluation Patient Details  Name: Lauren Crosby MRN: [id number] Date of Birth: 07-Feb-1931  Today's Date: 11/11/2017 Time: SLP Start Time (ACUTE ONLY): 0947 SLP Stop Time (ACUTE ONLY): 1005 SLP Time Calculation (min) (ACUTE ONLY): 18 min  Past Medical History:  Past Medical History:  Diagnosis Date  . Adrenal adenoma   . Anemia   . Arthritis    OA AND PAIN BOTH KNEES BUT RIGHT KNEE PAIN WORSE; SOME LOWER BACK PAIN  . Cellulitis and abscess of trunk   . Colon polyp    adenomatous  . Diabetes ([place])    type 2  PT IS ON ORAL MEDICATION AND INSULIN  . Diverticulitis   . GERD (gastroesophageal reflux disease)    NO MEDS FOR GERD  . Goiter   . Gout   . Hepatitis    YELLOW JAUNDICE WHEN A CHILD  . Hernia   . Hyperlipemia   . Hypertension   . Nephrolithiasis   . 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 Obesity   . Open wound of abdominal wall, anterior, without mention of complication   . Pneumonia    IN THE PAST  . Retinopathy due to secondary diabetes mellitus ([place])   . Shortness of breath    PT IS NOT ABLE TO BE ACTIVE BECAUSE OF KNEE PROBLEM; HAS ALWAYS HAD PROBLEM WITH BREATHING HEAVY - DOES GET SOB WITH EXERTION.  Marland Kitchen Thalassemia minor    Past Surgical History:  Past Surgical History:  Procedure Laterality Date  . APPENDECTOMY     1993 PT HAD APPENDECTOMY AND ABDOMINAL SURGERY FRO DIVERTICULITS  . CARDIAC CATHETERIZATION  2008   conclusion: smooth and normal coronary arteries, normal left ventricular systolic function  . CHOLECYSTECTOMY    . COLONOSCOPY  10/04/06  . CYSTOSCOPY WITH BIOPSY N/A 06/16/2016   Procedure: CYSTOSCOPY WITH  BLADDER BIOPSY AND FULGERATION 1CM;  Surgeon: Festus Aloe, MD;  Location: [facility];  Service: Urology;  Laterality: N/A;  . DIAGNOSTIC MAMMOGRAM  03/17/2011  . IR FLUORO GUIDE CV LINE RIGHT  11/03/2017  . IR US GUIDE VASC ACCESS RIGHT  11/03/2017  . ROTATOR CUFF REPAIR Left   . SIGMOID RESECTION  / RECTOPEXY    . TONSILLECTOMY    . TOTAL ABDOMINAL HYSTERECTOMY    . TOTAL KNEE ARTHROPLASTY Right 08/11/2013   Procedure: RIGHT TOTAL KNEE ARTHROPLASTY;  Surgeon: Mauri Pole, MD;  Location: [facility];  Service: Orthopedics;  Laterality: Right;  . VENTRAL HERNIA REPAIR  [id number]   HPI:  84 year old with past medical history relevant for type 2 diabetes on insulin, 3 CKD, hypertension, hyperlipidemia, chronic bilateral lower extremity weakness, class III obesity admitted on 10/30/2017 with C. difficile colitis and diarrhea and diffuse weakness with a prolonged hospital course complicated by septic shock and AKI requiring initiation of CRRT and ICU stay and encephalopathy.   Assessment / Plan / Recommendation Clinical Impression  Patient presents with a functional appearing pharyngeal swallow without overt indication of aspiration. Orally, combination of lethargy and AMS result in prolonged oral phase with solids, particularly with soft solids with periods of oral holding. Additionally, patient has full set of dentures which were not available during today's test. Spouse to bring in tomorrow. Recommend initiation of dysphagia 1 (puree) with thin liquid, meds whole in puree as a precaution. SLP will f/u for tolerance and potential to advance at bedside.  SLP Visit Diagnosis: Dysphagia, oral phase (R13.11)    Aspiration Risk  Moderate aspiration risk  Diet Recommendation Dysphagia 1 (Puree);Thin liquid   Liquid Administration via: Cup;Straw Medication Administration: Whole meds with puree Supervision: Patient able to self feed;Full supervision/cueing for compensatory strategies Compensations: Small sips/bites;Slow rate Postural Changes: Seated upright at 90 degrees    Other  Recommendations Oral Care Recommendations: Oral care BID   Follow up Recommendations None      Frequency and Duration min 1 x/week  1 week       Prognosis Prognosis for Safe Diet Advancement: Good      Swallow  Study   General HPI: 84 year old with past medical history relevant for type 2 diabetes on insulin, 3 CKD, hypertension, hyperlipidemia, chronic bilateral lower extremity weakness, class III obesity admitted on 10/30/2017 with C. difficile colitis and diarrhea and diffuse weakness with a prolonged hospital course complicated by septic shock and AKI requiring initiation of CRRT and ICU stay and encephalopathy. Type of Study: Bedside Swallow Evaluation Previous Swallow Assessment: none Diet Prior to this Study: NPO Temperature Spikes Noted: No Respiratory Status: Room air History of Recent Intubation: No Behavior/Cognition: Lethargic/Drowsy Oral Cavity Assessment: Dry Oral Care Completed by SLP: Yes Oral Cavity - Dentition: Dentures, not available Vision: Functional for self-feeding Self-Feeding Abilities: Able to feed self Patient Positioning: Upright in bed Baseline Vocal Quality: Low vocal intensity;Hoarse Volitional Cough: Strong Volitional Swallow: Able to elicit    Oral/Motor/Sensory Function Overall Oral Motor/Sensory Function: Generalized oral weakness   Ice Chips Ice chips: Within functional limits   Thin Liquid Thin Liquid: Within functional limits Presentation: Cup;Self Fed;Straw    Nectar Thick Nectar Thick Liquid: Not tested   Honey Thick Honey Thick Liquid: Not tested   Puree Puree: Within functional limits Presentation: Spoon   Solid     Solid: Impaired Oral Phase Impairments: Impaired mastication Oral Phase Functional Implications: Oral residue;Oral holding;Impaired mastication;Prolonged oral transit     Nasia Cannan MA, CCC-SLP   Twanda Stakes Meryl 11/11/2017,10:11 AM

## 2017-11-11 NOTE — Plan of Care (Signed)
Husband instructed on care plans at bedside.    Problem: Education: Goal: Knowledge of General Education information will improve Description Including pain rating scale, medication(s)/side effects and non-pharmacologic comfort measures Outcome: Progressing   Problem: Health Behavior/Discharge Planning: Goal: Ability to manage health-related needs will improve Outcome: Progressing   Problem: Clinical Measurements: Goal: Ability to maintain clinical measurements within normal limits will improve Outcome: Progressing Goal: Will remain free from infection Outcome: Progressing Goal: Diagnostic test results will improve Outcome: Progressing Goal: Respiratory complications will improve Outcome: Progressing Goal: Cardiovascular complication will be avoided Outcome: Progressing   Problem: Activity: Goal: Risk for activity intolerance will decrease Outcome: Progressing   Problem: Nutrition: Goal: Adequate nutrition will be maintained Outcome: Progressing   Problem: Coping: Goal: Level of anxiety will decrease Outcome: Progressing   Problem: Elimination: Goal: Will not experience complications related to bowel motility Outcome: Progressing Goal: Will not experience complications related to urinary retention Outcome: Progressing   Problem: Pain Managment: Goal: General experience of comfort will improve Outcome: Progressing   Problem: Safety: Goal: Ability to remain free from injury will improve Outcome: Progressing   Problem: Skin Integrity: Goal: Risk for impaired skin integrity will decrease Outcome: Progressing

## 2017-11-11 NOTE — Progress Notes (Signed)
CRITICAL VALUE ALERT  Critical Value:  Hemoglobin 6.6  Date & Time Notied:  11/11/2017 0446  Provider Notified: X. Blount, NP  Orders Received/Actions taken: awaiting response.

## 2017-11-11 NOTE — Progress Notes (Signed)
[facility] ROUNDING NOTE   Subjective:   Patient transferred to [facility] appears to be doing well this morning.  We discontinued CRRT 11/03/2017 to 11/10/2017.  Very minimal urine output noted.  Blood pressure 122/40 pulse of 84 temperature 98.6 O2 sats 98% 2 L nasal cannula.  Sodium 138 potassium 3.9 chloride 108 CO2 22 glucose 167 BUN 19 creatinine 2.45 calcium 8.3 phosphorus 3.5 magnesium 2.5 albumin 2.4 WBC 6.4 hemoglobin 6.6 platelets 126  Transfused 2 units packed red blood cells 11/11/2017 previous transfusion 1 unit packed red blood cells 11/06/2017.    Objective:  Vital signs in last 24 hours:  Temp:  [97.8 F (36.6 C)-98.7 F (37.1 C)] 98.6 F (37 C) (11/03 1032) Pulse Rate:  [84-99] 84 (11/03 1032) Resp:  [18-26] 22 (11/03 1032) BP: (96-133)/(29-52) 122/40 (11/03 1032) SpO2:  [93 %-100 %] 98 % (11/03 1032)  Weight change:  Filed Weights   11/07/17 0800 11/09/17 0539 11/10/17 0605  Weight: 116.9 kg 109.5 kg 108.3 kg    Intake/Output: I/O last 3 completed shifts: In: 189.3 [I.V.:189.3] Out: 1056 [Urine:20; Other:1036]   Intake/Output this shift:  Total I/O In: 84.8 [I.V.:34.3; IV Piggyback:50.5] Out: -   CVS- RRR murmurs rubs or gallops RS- CTA diminished at bases ABD- BS present soft non-distended proactive bowel sounds  EXT-chubby appearing lower extremities   Basic Metabolic Panel: Recent Labs  Lab 11/07/17 0533  11/08/17 0625  11/09/17 0400 11/09/17 1603 11/10/17 0540 11/10/17 1600 11/11/17 0346  NA 137   < > 138   < > 136 137 139  136 140 138  K 4.2   < > 4.3   < > 3.9 3.9 3.9  3.9 4.1 3.9  CL 105   < > 104   < > 104 105 105  102 107 108  CO2 23   < > 22   < > 25 24 26  25 25 25   GLUCOSE 106*   < > 179*   < > 207* 240* 195*  188* 174* 167*  BUN 16   < > 13   < > 15 17 16  16 19 19   CREATININE 1.70*   < > 1.55*   < > 1.58* 1.48* 1.41*  1.36* 1.86* 2.45*  CALCIUM 7.3*   < > 7.7*   < > 7.5* 7.7* 7.9*  7.7*  8.0* 8.3*  MG 2.7*  --  2.4  --  2.5*  --  2.4  --  2.5*  PHOS 3.1   < > 3.0   < > 2.3* 1.9* 2.3*  2.3* 3.0 3.5   < > = values in this interval not displayed.    Liver Function Tests: Recent Labs  Lab 11/09/17 0400 11/09/17 1603 11/10/17 0540 11/10/17 1600 11/11/17 0346  AST  --   --  35  --   --   ALT  --   --  22  --   --   ALKPHOS  --   --  101  --   --   BILITOT  --   --  0.7  --   --   PROT  --   --  6.3*  --   --   ALBUMIN 2.7* 2.8* 2.7*  2.7* 2.5* 2.4*   No results for input(s): LIPASE, AMYLASE in the last 168 hours. No results for input(s): AMMONIA in the last 168 hours.  CBC: Recent Labs  Lab 11/06/17 0416 11/06/17 1827 11/07/17 0533 11/08/17 9741  11/10/17 0540 11/11/17 0346  WBC 8.5  --  6.6 7.4 8.1 6.4  HGB 7.0* 7.4* 7.5* 7.4* 7.3* 6.6*  HCT 25.3* 26.4* 26.4* 26.3* 25.6* 22.7*  MCV 71.9*  --  75.2* 76.2* 75.1* 74.2*  PLT 143*  --  131* 117* 115* 128*    Cardiac Enzymes: No results for input(s): CKTOTAL, CKMB, CKMBINDEX, TROPONINI in the last 168 hours.  BNP: Invalid input(s): POCBNP  CBG: Recent Labs  Lab 11/10/17 1610 11/10/17 2019 11/10/17 2352 11/11/17 0432 11/11/17 0815  GLUCAP 160* 166* 167* 153* 162*    Microbiology: Results for orders placed or performed during the hospital encounter of 10/30/17  Gastrointestinal Panel by PCR , Stool     Status: None   Collection Time: 10/30/17  9:59 AM  Result Value Ref Range Status   Campylobacter species NOT DETECTED NOT DETECTED Final   Plesimonas shigelloides NOT DETECTED NOT DETECTED Final   Salmonella species NOT DETECTED NOT DETECTED Final   Yersinia enterocolitica NOT DETECTED NOT DETECTED Final   Vibrio species NOT DETECTED NOT DETECTED Final   Vibrio cholerae NOT DETECTED NOT DETECTED Final   Enteroaggregative E coli (EAEC) NOT DETECTED NOT DETECTED Final   Enteropathogenic E coli (EPEC) NOT DETECTED NOT DETECTED Final   Enterotoxigenic E coli (ETEC) NOT DETECTED NOT DETECTED Final    Shiga like toxin producing E coli (STEC) NOT DETECTED NOT DETECTED Final   Shigella/Enteroinvasive E coli (EIEC) NOT DETECTED NOT DETECTED Final   Cryptosporidium NOT DETECTED NOT DETECTED Final   Cyclospora cayetanensis NOT DETECTED NOT DETECTED Final   Entamoeba histolytica NOT DETECTED NOT DETECTED Final   Giardia lamblia NOT DETECTED NOT DETECTED Final   Adenovirus F40/41 NOT DETECTED NOT DETECTED Final   Astrovirus NOT DETECTED NOT DETECTED Final   Norovirus GI/GII NOT DETECTED NOT DETECTED Final   Rotavirus A NOT DETECTED NOT DETECTED Final   Sapovirus (I, II, IV, and V) NOT DETECTED NOT DETECTED Final    Comment: Performed at [facility], [address] [place], [place] [address]  C difficile quick scan w PCR reflex     Status: Abnormal   Collection Time: 10/30/17  9:59 AM  Result Value Ref Range Status   C Diff antigen POSITIVE (A) NEGATIVE Final   C Diff toxin NEGATIVE NEGATIVE Final   C Diff interpretation Results are indeterminate. See PCR results.  Final    Comment: Performed at [facility], [place] [address] [place], [place] [address]  C. Diff by PCR, Reflexed     Status: Abnormal   Collection Time: 10/30/17  9:59 AM  Result Value Ref Range Status   Toxigenic C. Difficile by PCR POSITIVE (A) NEGATIVE Final    Comment: Positive for toxigenic C. difficile with little to no toxin production. Only treat if clinical presentation suggests symptomatic illness. Performed at [facility], [place] [address], [place], [place] [address]   Urine Culture     Status: None   Collection Time: 11/03/17  1:07 AM  Result Value Ref Range Status   Specimen Description   Final    URINE, CATHETERIZED Performed at [facility] [address], [place], [place] [address]    Special Requests NONE  Final   Culture   Final    NO GROWTH Performed at [facility], [place] [address], [place],  [address]    Report Status 11/04/2017  FINAL  Final  MRSA PCR Screening     Status: None  Collection Time: 11/03/17 10:49 AM  Result Value Ref Range Status   MRSA by PCR NEGATIVE NEGATIVE Final    Comment:        The GeneXpert MRSA Assay (FDA approved for NASAL specimens only), is one component of a comprehensive MRSA colonization surveillance program. It is not intended to diagnose MRSA infection nor to guide or monitor treatment for MRSA infections. Performed at [facility], [place] [address], [place], [place] [address]     Coagulation Studies: No results for input(s): LABPROT, INR in the last 72 hours.  Urinalysis: No results for input(s): COLORURINE, LABSPEC, PHURINE, GLUCOSEU, HGBUR, BILIRUBINUR, KETONESUR, PROTEINUR, UROBILINOGEN, NITRITE, LEUKOCYTESUR in the last 72 hours.  Invalid input(s): APPERANCEUR    Imaging: Dg Abd 1 View  Result Date: 11/09/2017 CLINICAL DATA:  Patient pulled out nasogastric catheter EXAM: ABDOMEN - 1 VIEW COMPARISON:  11/07/2017 FINDINGS: Scattered large and small bowel gas is noted. The previously seen nasogastric catheter has been removed in the interval. Despite the given clinical history of metal tipped catheter this catheter on previous images represented a standard nasogastric catheter without metallic weighting. No foreign body is noted. IMPRESSION: No evidence of retained foreign body as the prior nasogastric catheter was not a weighted tipped catheter based on previous images. Electronically Signed   By: Inez Catalina M.D.   On: 11/09/2017 19:57   Dg Chest Port 1 View  Result Date: 11/09/2017 CLINICAL DATA:  Patient removed recent nasogastric catheter EXAM: PORTABLE CHEST 1 VIEW COMPARISON:  11/08/2017 FINDINGS: Cardiac shadow is enlarged. Right jugular central line is again seen and stable. Inspiratory effort is poor with crowding of the vascular markings. No focal confluent infiltrate is seen. No radiopaque foreign body is noted. The previously seen  nasogastric catheter has been removed and as previously described was not a weighted tipped catheter. IMPRESSION: Poor inspiratory effort.  No acute abnormality noted. Electronically Signed   By: Inez Catalina M.D.   On: 11/09/2017 19:58     Medications:   . sodium chloride Stopped (11/10/17 1638)  . feeding supplement (VITAL AF 1.2 CAL) Stopped (11/09/17 2000)   . Chlorhexidine Gluconate Cloth  6 each Topical Daily  . feeding supplement (PRO-STAT SUGAR FREE 64)  30 mL Oral TID WC  . ferrous sulfate  325 mg Oral BID WC  . furosemide  40 mg Intravenous Once  . heparin injection (subcutaneous)  5,000 Units Subcutaneous Q8H  . insulin aspart  0-9 Units Subcutaneous Q4H  . mouth rinse  15 mL Mouth Rinse BID  . midodrine  5 mg Oral TID WC  . nystatin  5 mL Oral QID  . ranitidine  150 mg Oral BID  . sodium chloride flush  10-40 mL Intracatheter Q12H   acetaminophen, fentaNYL (SUBLIMAZE) injection, ipratropium-albuterol  Assessment/ Plan:   1. AKIin setting of volume depletion- presumably ischemic ATN, oliguric. Unfortunately her volume status was worsening as well as her metabolic acidosis so she was started on CVVHD on 11/03/17. Discontinued 11/10/2017.  Patient has been transferred to [facility] in anticipation of intermittent dialysis.  It does not appear to be any recovery of renal function at this time he urine output remains poor.  She is a marginal candidate for outpatient intermittent hemodialysis and the family understand this. 2. Volume/HTN-keep even on dialysis we will start Midrin 5 mg 3 times blood pressure appears to have improved 3. Colitis-C. difficile antibiotics have been discontinued course completed 4. Metabolic acidosis resolved 5. Microcytic anemia-ordered 2 units  packed red blood cells 11/11/2017 6. DM type 2 primary team 7. AMS-appears to be slightly improved but continued with pain.  Mental status appears to be improved   LOS: [place] @TODAY @11 :59 AM

## 2017-11-11 NOTE — Progress Notes (Addendum)
PROGRESS NOTE                                                                                                                                                                                                             Patient Demographics:    Lauren Crosby, is a 84 y.o. female, DOB - November 26, 1931, CBU:[id number]  Admit date - 10/30/2017   Admitting Physician Costin Karlyne Greenspan, MD  Outpatient Primary MD for the patient is [name], MD  LOS - 11  Chief Complaint  Patient presents with  . Diarrhea       Brief Narrative  84 year old with past medical history relevant for type 2 diabetes on insulin, 3 CKD, hypertension, hyperlipidemia, chronic bilateral lower extremity weakness, class III obesity admitted on 10/30/2017 with C. difficile colitis and diarrhea and diffuse weakness with a prolonged hospital course complicated by septic shock and AKI requiring initiation of CRRT and ICU stay and encephalopathy.   Subjective:    Lauren Crosby today has, No headache, No chest pain, No abdominal pain - No Nausea, No new weakness tingling or numbness, No Cough - SOB. +ve generalized weakness.   Assessment  & Plan :     1.  Septic shock, hypotension, acute renal failure caused by C. difficile colitis.  She was briefly in the ICU at [facility] and underwent few sessions of CRRT via right IJ dialysis catheter.  Nephrology following.  Shock physiology has resolved, C. difficile diarrhea is much improved.  She has finished her antibiotic course.  2.  ARF due to #1 above.  Nephrology on board, will transfuse 2 units of packed RBC for better perfusion 11/11/2017 and monitor renal function and urine output.  3.  Metabolic and toxic encephalopathy.  Due to #1 and 2 above.  Improving.  Supportive care, remains at risk for delirium, minimize narcotics and benzodiazepines.  Palliative care on board.  Currently DNR but husband wants to pursue present line of care.  4.  History of  gout.  On allopurinol.  5.  Essential hypertension.  Blood pressure currently low, Lopressor has been discontinued currently on midodrine.  6.  This lipidemia.  Continue home dose statin.  7.  Dysphagia.  Speech therapy on board currently on dysphagia 1 diet.    8.  Diffuse weakness, deconditioning.  Bedbound status was 20 at home with minimal ambulation with walker at times.  PT initiated.    9.  Iron deficiency microcytic anemia along with acute anemia of acute  sickness.  No signs of active bleeding, most of the anemia is due to multiple blood draws on top of microcytic iron deficiency anemia, transfused 2 units of packed RBC on 11/11/2017, anemia panel noted placed on oral iron and monitor.  Placed on Zantac instead of PPI due to recent C. difficile.    10. DM type II.  Currently on sliding scale, oral intake is poor we will continue to monitor.  Lab Results  Component Value Date   HGBA1C 6.7 (H) 05/24/2017   CBG (last 3)  Recent Labs    11/10/17 2352 11/11/17 0432 11/11/17 0815  GLUCAP 167* 153* 162*     Family Communication  :  Husband Bedside  Code Status :  DNR  Disposition Plan  :  SNF  Consults  :  Renal, PCCM, Pall Care  Procedures  :     [facility] catheter placement on 11/03/2017  CRRT initiated on 11/03/2017  2 Units PRBCs 11/11/17  DVT Prophylaxis  :  Heparin added  Lab Results  Component Value Date   PLT 128 (L) 11/11/2017    Diet :  Diet Order            DIET - DYS 1 Room service appropriate? Yes; Fluid consistency: Thin  Diet effective now               Inpatient Medications Scheduled Meds: . Chlorhexidine Gluconate Cloth  6 each Topical Daily  . feeding supplement (PRO-STAT SUGAR FREE 64)  30 mL Oral TID WC  . furosemide  40 mg Intravenous Once  . insulin aspart  0-9 Units Subcutaneous Q4H  . mouth rinse  15 mL Mouth Rinse BID  . midodrine  5 mg Oral TID WC  . nystatin  5 mL Oral QID  . sodium chloride flush  10-40 mL Intracatheter Q12H     Continuous Infusions: . sodium chloride 10 mL/hr at 11/10/17 1400  . famotidine (PEPCID) IV 20 mg (11/10/17 1845)  . feeding supplement (VITAL AF 1.2 CAL) Stopped (11/09/17 2000)   PRN Meds:.acetaminophen, fentaNYL (SUBLIMAZE) injection, ipratropium-albuterol  Antibiotics  :   Anti-infectives (From admission, onward)   Start     Dose/Rate Route Frequency Ordered Stop   11/02/17 1200  cefTRIAXone (ROCEPHIN) 2 g in sodium chloride 0.9 % 100 mL IVPB     2 g 200 mL/hr over 30 Minutes Intravenous Every 24 hours 11/02/17 0856 11/05/17 1245   10/31/17 1200  cefTRIAXone (ROCEPHIN) 1 g in sodium chloride 0.9 % 100 mL IVPB  Status:  Discontinued     1 g 200 mL/hr over 30 Minutes Intravenous Every 24 hours 10/31/17 1005 10/31/17 1056   10/31/17 1200  cefTRIAXone (ROCEPHIN) 2 g in sodium chloride 0.9 % 100 mL IVPB  Status:  Discontinued     2 g 200 mL/hr over 30 Minutes Intravenous Every 24 hours 10/31/17 1056 11/02/17 0856   10/31/17 1000  vancomycin (VANCOCIN) 50 mg/mL oral solution 125 mg  Status:  Discontinued     125 mg Oral 4 times daily 10/31/17 0951 10/31/17 0952   10/30/17 1800  vancomycin (VANCOCIN) 50 mg/mL oral solution 125 mg  Status:  Discontinued     125 mg Oral 4 times daily 10/30/17 1731 10/31/17 0959   10/30/17 1430  ciprofloxacin (CIPRO) IVPB 400 mg  Status:  Discontinued     400 mg 200 mL/hr over 60 Minutes Intravenous Every 24 hours 10/30/17 1415 10/31/17 1048   10/30/17 1430  metroNIDAZOLE (FLAGYL) IVPB 500 mg  500 mg 100 mL/hr over 60 Minutes Intravenous Every 8 hours 10/30/17 1415 11/05/17 2334          Objective:   Vitals:   11/10/17 2350 11/11/17 0450 11/11/17 1006 11/11/17 1032  BP: (!) 132/49 (!) 133/50 (!) 126/43 (!) 122/40  Pulse: 99 95 88 84  Resp: (!) 25 (!) 26 20 (!) 22  Temp: 98.1 F (36.7 C) 98.4 F (36.9 C) 98.1 F (36.7 C) 98.6 F (37 C)  TempSrc: Oral Oral Oral Oral  SpO2: 97% 93% 97% 98%  Weight:      Height:        Wt Readings  from Last 3 Encounters:  11/10/17 108.3 kg  06/01/17 106.9 kg  03/01/17 112.5 kg     Intake/Output Summary (Last 24 hours) at 11/11/2017 1049 Last data filed at 11/10/2017 1400 Gross per 24 hour  Intake 69.97 ml  Output -  Net 69.97 ml     Physical Exam  Awake Alert, Oriented X 3, No new F.N deficits, Normal affect [place].AT,PERRAL Supple Neck,No JVD, No cervical lymphadenopathy appriciated.  Symmetrical Chest wall movement, Good air movement bilaterally, CTAB RRR,No Gallops,Rubs or new Murmurs, No Parasternal Heave +ve B.Sounds, Abd Soft, No tenderness, No organomegaly appriciated, No rebound - guarding or rigidity. No Cyanosis, Clubbing or edema, No new Rash or bruise      Data Review:    CBC Recent Labs  Lab 11/06/17 0416 11/06/17 1827 11/07/17 0533 11/08/17 0625 11/10/17 0540 11/11/17 0346  WBC 8.5  --  6.6 7.4 8.1 6.4  HGB 7.0* 7.4* 7.5* 7.4* 7.3* 6.6*  HCT 25.3* 26.4* 26.4* 26.3* 25.6* 22.7*  PLT 143*  --  131* 117* 115* 128*  MCV 71.9*  --  75.2* 76.2* 75.1* 74.2*  MCH 19.9*  --  21.4* 21.4* 21.4* 21.6*  MCHC 27.7*  --  28.4* 28.1* 28.5* 29.1*  RDW 19.7*  --  22.5* 23.2* 23.7* 23.6*    Chemistries  Recent Labs  Lab 11/07/17 0533  11/08/17 0625  11/09/17 0400 11/09/17 1603 11/10/17 0540 11/10/17 1600 11/11/17 0346  NA 137   < > 138   < > 136 137 139  136 140 138  K 4.2   < > 4.3   < > 3.9 3.9 3.9  3.9 4.1 3.9  CL 105   < > 104   < > 104 105 105  102 107 108  CO2 23   < > 22   < > _0 GLUCOSE 106*   < > 179*   < > 207* 240* 195*  188* 174* 167*  BUN 16   < > 13   < > _1 CREATININE 1.70*   < > 1.55*   < > 1.58* 1.48* 1.41*  1.36* 1.86* 2.45*  CALCIUM 7.3*   < > 7.7*   < > 7.5* 7.7* 7.9*  7.7* 8.0* 8.3*  MG 2.7*  --  2.4  --  2.5*  --  2.4  --  2.5*  AST  --   --   --   --   --   --  35  --   --   ALT  --   --   --   --   --   --  22  --   --   ALKPHOS  --   --   --   --   --   --  101  --   --   BILITOT  --    --   --   --   --   --  0.7  --   --    < > = values in this interval not displayed.   ------------------------------------------------------------------------------------------------------------------ No results for input(s): CHOL, HDL, LDLCALC, TRIG, CHOLHDL, LDLDIRECT in the last 72 hours.  Lab Results  Component Value Date   HGBA1C 6.7 (H) 05/24/2017   ------------------------------------------------------------------------------------------------------------------ No results for input(s): TSH, T4TOTAL, T3FREE, THYROIDAB in the last 72 hours.  Invalid input(s): FREET3 ------------------------------------------------------------------------------------------------------------------ No results for input(s): VITAMINB12, FOLATE, FERRITIN, TIBC, IRON, RETICCTPCT in the last 72 hours.  Coagulation profile No results for input(s): INR, PROTIME in the last 168 hours.  No results for input(s): DDIMER in the last 72 hours.  Cardiac Enzymes No results for input(s): CKMB, TROPONINI, MYOGLOBIN in the last 168 hours.  Invalid input(s): CK ------------------------------------------------------------------------------------------------------------------    Component Value Date/Time   BNP 83.7 01/12/2017 2018   BNP 18.5 08/14/2012 0930    Micro Results Recent Results (from the past 240 hour(s))  Urine Culture     Status: None   Collection Time: 11/03/17  1:07 AM  Result Value Ref Range Status   Specimen Description   Final    URINE, CATHETERIZED Performed at [facility] [address] [place], [place] [address]    Special Requests NONE  Final   Culture   Final    NO GROWTH Performed at [facility], [place] [address] [place], [place] [address]    Report Status 11/04/2017 FINAL  Final  MRSA PCR Screening     Status: None   Collection Time: 11/03/17 10:49 AM  Result Value Ref Range Status   MRSA by PCR NEGATIVE NEGATIVE Final    Comment:        The  GeneXpert MRSA Assay (FDA approved for NASAL specimens only), is one component of a comprehensive MRSA colonization surveillance program. It is not intended to diagnose MRSA infection nor to guide or monitor treatment for MRSA infections. Performed at [facility], [place] [address] [place], [place] [address]     Radiology Reports Ct Abdomen Pelvis Wo Contrast  Result Date: 10/30/2017 CLINICAL DATA:  Abdominal distension, diarrhea for 5 days, blood in diarrhea, unable to control bladder or bowel, history of adrenal adenoma, diabetes mellitus, hypertension, nephrolithiasis, renal failure EXAM: CT ABDOMEN AND PELVIS WITHOUT CONTRAST TECHNIQUE: Multidetector CT imaging of the abdomen and pelvis was performed following the standard protocol without IV contrast. Sagittal and coronal MPR images reconstructed from axial data set. Patient drank dilute oral contrast for exam COMPARISON:  05/24/2017 FINDINGS: Lower chest: Bibasilar atelectasis and peribronchial thickening Hepatobiliary: Gallbladder surgically absent. No focal hepatic abnormalities Pancreas: Pancreatic atrophy without mass Spleen: Normal appearance Adrenals/Urinary Tract: Low-attenuation LEFT adrenal mass 3.1 x 2.6 cm consistent with adenoma. RIGHT adrenal gland unremarkable. BILATERAL renal cortical atrophy. Exophytic intermediate attenuation mass lateral aspect of inferior RIGHT kidney, 3.7 x 3.6 cm image 56. Minimal prominence of LEFT renal pelvis and LEFT ureter in the pelvis without obstructing calculus. RIGHT ureter and bladder normal appearance. Stomach/Bowel: Appendix surgically absent by history. Scattered colonic diverticulosis. Wall thickening of sigmoid colon with mild pericolic infiltrative changes. Additional wall thickening at the rectosigmoid colon. Findings could represent acute diverticulitis or colitis. No abscess or extraluminal gas. Stomach and small bowel loops unremarkable. Vascular/Lymphatic:  Atherosclerotic calcifications aorta and iliac arteries. Coronary arterial calcifications. Aorta normal caliber. No adenopathy.  Reproductive: Uterus surgically absent.  Normal appearing ovaries. Other: No free air or free fluid. Tiny LEFT parasagittal ventral hernia inferior to the umbilicus. Musculoskeletal: Demineralized with degenerative disc and facet disease changes lumbar spine. IMPRESSION: Diffuse colonic diverticulosis. Wall thickening of the rectosigmoid colon with mild pericolic infiltrative changes, question diverticulitis versus colitis. No evidence of perforation or abscess. LEFT adrenal adenoma 3.1 x 2.6 cm. Grossly unchanged appearance of an exophytic mass 3.7 x 3.6 cm at the lateral RIGHT kidney, question complicated cyst; lesion is unchanged since 05/24/2017 but minimally larger than seen on 02/10/2015 when it measured 3.5 x 3.2 cm, recommended attention on follow-up imaging to establish long-term stability. Electronically Signed   By: Lavonia Dana M.D.   On: 10/30/2017 13:03   Dg Abd 1 View  Result Date: 11/09/2017 CLINICAL DATA:  Patient pulled out nasogastric catheter EXAM: ABDOMEN - 1 VIEW COMPARISON:  11/07/2017 FINDINGS: Scattered large and small bowel gas is noted. The previously seen nasogastric catheter has been removed in the interval. Despite the given clinical history of metal tipped catheter this catheter on previous images represented a standard nasogastric catheter without metallic weighting. No foreign body is noted. IMPRESSION: No evidence of retained foreign body as the prior nasogastric catheter was not a weighted tipped catheter based on previous images. Electronically Signed   By: Inez Catalina M.D.   On: 11/09/2017 19:57   Dg Abd 1 View  Result Date: 11/07/2017 CLINICAL DATA:  84 year old female with feeding tube placement. EXAM: ABDOMEN - 1 VIEW COMPARISON:  Earlier abdominal radiograph dated 11/07/2017 FINDINGS: No significant change in the appearance or positioning of  the enteric tube which appears somewhat folded, likely in the mid to distal stomach. IMPRESSION: No significant interval change. Electronically Signed   By: Anner Crete M.D.   On: 11/07/2017 23:00   Dg Abd 1 View  Result Date: 11/03/2017 CLINICAL DATA:  Abdominal distension. EXAM: ABDOMEN - 1 VIEW COMPARISON:  CT, 10/30/2017 FINDINGS: Normal bowel gas pattern with no evidence of obstruction. Soft tissues are not well-defined due to body habitus. No convincing renal or ureteral stones. Status post cholecystectomy. No acute skeletal abnormality. IMPRESSION: 1. No acute findings.  No evidence of bowel obstruction. Electronically Signed   By: Lajean Manes M.D.   On: 11/03/2017 11:17   Ir Fluoro Guide Cv Line Right  Result Date: 11/03/2017 INDICATION: End-stage renal disease. In need intravenous access for the initiation of hemodialysis. EXAM: NON-TUNNELED CENTRAL VENOUS HEMODIALYSIS CATHETER PLACEMENT WITH ULTRASOUND AND FLUOROSCOPIC GUIDANCE COMPARISON:  Chest radiograph - 10/31/2017 MEDICATIONS: None FLUOROSCOPY TIME:  24 seconds (14 mGy) COMPLICATIONS: None immediate. PROCEDURE: Informed written consent was obtained from patient's family after a discussion of the risks, benefits, and alternatives to treatment. Questions regarding the procedure were encouraged and answered. The right neck and chest were prepped with chlorhexidine in a sterile fashion, and a sterile drape was applied covering the operative field. Maximum barrier sterile technique with sterile gowns and gloves were used for the procedure. A timeout was performed prior to the initiation of the procedure. After the overlying soft tissues were anesthetized, a small venotomy incision was created and a micropuncture kit was utilized to access the internal jugular vein. Real-time ultrasound guidance was utilized for vascular access including the acquisition of a permanent ultrasound image documenting patency of the accessed vessel. The  microwire was utilized to measure appropriate catheter length. A stiff glidewire was advanced to the level of the IVC. Under fluoroscopic guidance, the venotomy was serially dilated,  ultimately allowing placement of a 20 cm temporary Trialysis catheter with tip ultimately terminating within the superior aspect of the right atrium. Final catheter positioning was confirmed and documented with a spot radiographic image. The catheter aspirates and flushes normally. The catheter was flushed with appropriate volume heparin dwells. The catheter exit site was secured with a 0-Prolene retention suture. A dressing was placed. The patient tolerated the procedure well without immediate post procedural complication. IMPRESSION: Successful placement of a right internal jugular approach 20 cm temporary dialysis catheter with tip terminating with in the superior aspect of the right atrium. The catheter is ready for immediate use. PLAN: This catheter may be converted to a tunneled dialysis catheter at a later date as indicated. Electronically Signed   By: Sandi Mariscal M.D.   On: 11/03/2017 18:01   Ir US Guide Vasc Access Right  Result Date: 11/03/2017 INDICATION: End-stage renal disease. In need intravenous access for the initiation of hemodialysis. EXAM: NON-TUNNELED CENTRAL VENOUS HEMODIALYSIS CATHETER PLACEMENT WITH ULTRASOUND AND FLUOROSCOPIC GUIDANCE COMPARISON:  Chest radiograph - 10/31/2017 MEDICATIONS: None FLUOROSCOPY TIME:  24 seconds (14 mGy) COMPLICATIONS: None immediate. PROCEDURE: Informed written consent was obtained from patient's family after a discussion of the risks, benefits, and alternatives to treatment. Questions regarding the procedure were encouraged and answered. The right neck and chest were prepped with chlorhexidine in a sterile fashion, and a sterile drape was applied covering the operative field. Maximum barrier sterile technique with sterile gowns and gloves were used for the procedure. A timeout  was performed prior to the initiation of the procedure. After the overlying soft tissues were anesthetized, a small venotomy incision was created and a micropuncture kit was utilized to access the internal jugular vein. Real-time ultrasound guidance was utilized for vascular access including the acquisition of a permanent ultrasound image documenting patency of the accessed vessel. The microwire was utilized to measure appropriate catheter length. A stiff glidewire was advanced to the level of the IVC. Under fluoroscopic guidance, the venotomy was serially dilated, ultimately allowing placement of a 20 cm temporary Trialysis catheter with tip ultimately terminating within the superior aspect of the right atrium. Final catheter positioning was confirmed and documented with a spot radiographic image. The catheter aspirates and flushes normally. The catheter was flushed with appropriate volume heparin dwells. The catheter exit site was secured with a 0-Prolene retention suture. A dressing was placed. The patient tolerated the procedure well without immediate post procedural complication. IMPRESSION: Successful placement of a right internal jugular approach 20 cm temporary dialysis catheter with tip terminating with in the superior aspect of the right atrium. The catheter is ready for immediate use. PLAN: This catheter may be converted to a tunneled dialysis catheter at a later date as indicated. Electronically Signed   By: Sandi Mariscal M.D.   On: 11/03/2017 18:01   Dg Chest Port 1 View  Result Date: 11/09/2017 CLINICAL DATA:  Patient removed recent nasogastric catheter EXAM: PORTABLE CHEST 1 VIEW COMPARISON:  11/08/2017 FINDINGS: Cardiac shadow is enlarged. Right jugular central line is again seen and stable. Inspiratory effort is poor with crowding of the vascular markings. No focal confluent infiltrate is seen. No radiopaque foreign body is noted. The previously seen nasogastric catheter has been removed and as  previously described was not a weighted tipped catheter. IMPRESSION: Poor inspiratory effort.  No acute abnormality noted. Electronically Signed   By: Inez Catalina M.D.   On: 11/09/2017 19:58   Dg Chest Port 1 View  Result Date:  11/08/2017 CLINICAL DATA:  Respiratory failure. EXAM: PORTABLE CHEST 1 VIEW COMPARISON:  11/07/2017 and older exams. FINDINGS: Mild enlargement of the cardiopericardial silhouette, stable. Prominent bronchovascular markings, with additional lung base opacity, the latter consistent with atelectasis, also without change from the previous day's exam. New nasal/orogastric tube passes below the included field of view, likely into the stomach. Right subclavian dual lumen central venous catheter is stable, tip in the right atrium. No pneumothorax. IMPRESSION: 1. No significant change in lung aeration from the previous day's study. There are prominent bronchovascular markings and lung base atelectasis, but no convincing pulmonary edema or pneumonia. 2. New nasal/orogastric tube. Tip not visualized, but likely extends into the stomach. Electronically Signed   By: Lajean Manes M.D.   On: 11/08/2017 07:13   Dg Chest Port 1 View  Result Date: 11/07/2017 CLINICAL DATA:  Acute respiratory failure with hypoxia. EXAM: PORTABLE CHEST 1 VIEW COMPARISON:  10/31/2017. FINDINGS: Cardiomegaly. Improved lung volumes. Increased perihilar markings could represent early edema or viral pneumonitis. Other than improved lung markings, similar appearance to priors. Dialysis catheter has been inserted via RIGHT IJ approach. Tip slide in the RIGHT atrium. There is no pneumothorax. IMPRESSION: Satisfactory catheter placement. No significant change in the appearance of the lungs except for improved volume. Cardiomegaly. Electronically Signed   By: Staci Righter M.D.   On: 11/07/2017 11:30   Dg Chest Port 1 View  Result Date: 10/31/2017 CLINICAL DATA:  84 year old female with shortness of breath. EXAM:  PORTABLE CHEST 1 VIEW COMPARISON:  Portable chest 10/30/2017 and earlier. CT Abdomen and Pelvis 10/30/2017. FINDINGS: There did not appear to be overt pulmonary edema at the lung bases yesterday by CT. Lower lobe bronchiectasis and peribronchial thickening was noted. Continued low lung volumes stable appearance of the pulmonary interstitium today from May this year. Stable mild cardiomegaly. Visualized tracheal air column is within normal limits. No pneumothorax, pleural effusion or confluent opacity. Large body habitus. No acute osseous abnormality identified. IMPRESSION: Persistent low lung volumes. No focal pulmonary abnormality. Consider viral or atypical respiratory infection given the appearance of the lung bases on CT Abdomen and Pelvis yesterday. Electronically Signed   By: Genevie Ann M.D.   On: 10/31/2017 13:18   Dg Chest Port 1 View  Result Date: 10/30/2017 CLINICAL DATA:  84 year old female with constipation and watery diarrhea. Initial encounter. EXAM: PORTABLE CHEST 1 VIEW COMPARISON:  05/27/2017. FINDINGS: Cardiomegaly. Asymmetric mild pulmonary edema superimposed upon chronic changes. Calcified aorta. IMPRESSION: 1. Asymmetric mild pulmonary edema. 2. Cardiomegaly. 3.  Aortic Atherosclerosis (ICD10-I70.0). Electronically Signed   By: Genia Del M.D.   On: 10/30/2017 15:06   Dg Abd Portable 1v  Result Date: 11/07/2017 CLINICAL DATA:  Nasogastric tube placement. EXAM: PORTABLE ABDOMEN - 1 VIEW COMPARISON:  11/03/2017 FINDINGS: Interval nasogastric tube folded back upon itself in the mid to distal stomach with its tip in the mid stomach. The included bowel gas pattern is normal. Prominent interstitial markings at the left lung base. Lower thoracic spine degenerative changes. IMPRESSION: Nasogastric tube folded back upon itself in the mid to distal stomach with its tip in the mid stomach. Electronically Signed   By: Claudie Revering M.D.   On: 11/07/2017 13:13    Time Spent in minutes   30   Lala Lund M.D on 11/11/2017 at 10:49 AM  To page go to [email or web address] - password [facility]

## 2017-11-11 NOTE — Progress Notes (Signed)
Chaplain made initial contact with husband and patient.  Husband is very pleasant, hard of hearing.  Patient was sleeping, roused with effort upon greeting. Husband reports patient is Catholic, but he is "non-denominational".  When chaplain offered support, such a prayer, husband was enthusiastic, saying prayer is always a good thing, but wife shook her head no.  Chaplain asked wife what she would like; patient mumbled something unintelligible.  When asked if she would like a short prayer, husband again said yes, but wife shook head.  Chaplain offered words of support and said she would step out and return, offering husband and wife to speak. Wife returned to resting.   A short while later, chaplain returned. Engaged with husband in supportive conversation. Husband was reading Bible, mentioned he is also ordained in the [employer]. Admitted to some tension with wife's Catholicism and his tradition. Offered words of comfort to patient and husband, and more family members as they arrived.   Please contact for further support as needed.   Luana Shu [phone]     11/11/17 1000  Clinical Encounter Type  Visited With Patient and family together  Visit Type Initial  Referral From Patient;Palliative care team  Consult/Referral To Chaplain  Spiritual Encounters  Spiritual Needs Ritual;Emotional  Stress Factors  Patient Stress Factors Exhausted;Health changes  Family Stress Factors Family relationships;Loss of control

## 2017-11-12 LAB — RENAL FUNCTION PANEL
ALBUMIN: 2.4 g/dL — AB (ref 3.5–5.0)
Anion gap: 3 — ABNORMAL LOW (ref 5–15)
BUN: 30 mg/dL — AB (ref 8–23)
CO2: 26 mmol/L (ref 22–32)
CREATININE: 2.69 mg/dL — AB (ref 0.44–1.00)
Calcium: 8.2 mg/dL — ABNORMAL LOW (ref 8.9–10.3)
Chloride: 107 mmol/L (ref 98–111)
GFR, EST AFRICAN AMERICAN: 18 mL/min — AB (ref >60)
GFR, EST NON AFRICAN AMERICAN: 15 mL/min — AB (ref >60)
Glucose, Bld: 212 mg/dL — ABNORMAL HIGH (ref 70–99)
PHOSPHORUS: 5 mg/dL — AB (ref 2.5–4.6)
Potassium: 3.5 mmol/L (ref 3.5–5.1)
Sodium: 136 mmol/L (ref 135–145)

## 2017-11-12 LAB — CBC
HCT: 27.7 % — ABNORMAL LOW (ref 36.0–46.0)
Hemoglobin: 8.2 g/dL — ABNORMAL LOW (ref 12.0–15.0)
MCH: 22.8 pg — ABNORMAL LOW (ref 26.0–34.0)
MCHC: 29.6 g/dL — ABNORMAL LOW (ref 30.0–36.0)
MCV: 77.2 fL — ABNORMAL LOW (ref 80.0–100.0)
PLATELETS: 143 10*3/uL — AB (ref 150–400)
RBC: 3.59 MIL/uL — AB (ref 3.87–5.11)
RDW: 22.9 % — ABNORMAL HIGH (ref 11.5–15.5)
WBC: 6.2 10*3/uL (ref 4.0–10.5)
nRBC: 0.5 % — ABNORMAL HIGH (ref 0.0–0.2)

## 2017-11-12 LAB — BPAM RBC
BLOOD PRODUCT EXPIRATION DATE: 201911282359
Blood Product Expiration Date: 201911282359
ISSUE DATE / TIME: 201911031611
ISSUE DATE / TIME: 201911031010
UNIT TYPE AND RH: 6200
Unit Type and Rh: 6200

## 2017-11-12 LAB — TYPE AND SCREEN
ABO/RH(D): A POS
Antibody Screen: NEGATIVE
UNIT DIVISION: 0
Unit division: 0

## 2017-11-12 LAB — GLUCOSE, CAPILLARY
GLUCOSE-CAPILLARY: 196 mg/dL — AB (ref 70–99)
GLUCOSE-CAPILLARY: 205 mg/dL — AB (ref 70–99)
GLUCOSE-CAPILLARY: 210 mg/dL — AB (ref 70–99)
GLUCOSE-CAPILLARY: 247 mg/dL — AB (ref 70–99)
Glucose-Capillary: 212 mg/dL — ABNORMAL HIGH (ref 70–99)
Glucose-Capillary: 224 mg/dL — ABNORMAL HIGH (ref 70–99)

## 2017-11-12 LAB — MAGNESIUM: MAGNESIUM: 2.6 mg/dL — AB (ref 1.7–2.4)

## 2017-11-12 MED ORDER — SODIUM CHLORIDE 0.9 % IV SOLN
510.0000 mg | Freq: Once | INTRAVENOUS | Status: AC
  Administered 2017-11-12: 510 mg via INTRAVENOUS
  Filled 2017-11-12: qty 17

## 2017-11-12 MED ORDER — POTASSIUM CHLORIDE CRYS ER 20 MEQ PO TBCR
40.0000 meq | EXTENDED_RELEASE_TABLET | Freq: Once | ORAL | Status: AC
  Administered 2017-11-12: 40 meq via ORAL
  Filled 2017-11-12: qty 2

## 2017-11-12 MED ORDER — PRO-STAT SUGAR FREE PO LIQD
30.0000 mL | Freq: Two times a day (BID) | ORAL | Status: DC
  Administered 2017-11-13 – 2017-11-16 (×6): 30 mL via ORAL
  Filled 2017-11-12 (×9): qty 30

## 2017-11-12 MED ORDER — DARBEPOETIN ALFA 100 MCG/0.5ML IJ SOSY
100.0000 ug | PREFILLED_SYRINGE | Freq: Once | INTRAMUSCULAR | Status: AC
  Administered 2017-11-12: 100 ug via SUBCUTANEOUS
  Filled 2017-11-12: qty 0.5

## 2017-11-12 MED ORDER — LACTATED RINGERS IV SOLN
INTRAVENOUS | Status: AC
  Administered 2017-11-12: 13:00:00 via INTRAVENOUS

## 2017-11-12 NOTE — Progress Notes (Signed)
PROGRESS NOTE                                                                                                                                                                                                             Patient Demographics:    Lauren Crosby, is a 84 y.o. female, DOB - 1931-04-25, DJS:[id number]  Admit date - 10/30/2017   Admitting Physician Costin Karlyne Greenspan, MD  Outpatient Primary MD for the patient is [name], MD  LOS - 12  Chief Complaint  Patient presents with  . Diarrhea       Brief Narrative  84 year old with past medical history relevant for type 2 diabetes on insulin, 3 CKD, hypertension, hyperlipidemia, chronic bilateral lower extremity weakness, class III obesity admitted on 10/30/2017 with C. difficile colitis and diarrhea and diffuse weakness with a prolonged hospital course complicated by septic shock and AKI requiring initiation of CRRT and ICU stay and encephalopathy.   Subjective:   Patient in bed, appears comfortable, denies any headache, no fever, no chest pain or pressure, no shortness of breath , no abdominal pain. No focal weakness.   Assessment  & Plan :     1.  Septic shock, hypotension, acute renal failure caused by C. difficile colitis.  She was briefly in the ICU at [facility] and underwent few sessions of CRRT via right IJ dialysis catheter.  Nephrology following.  Shock physiology has resolved, C. difficile diarrhea is much improved.  She has finished her antibiotic course.  2.  ARF due to #1 above.  Nephrology on board, had to undergo dialysis sessions at [facility] long hospital this admission, status post 2 units of transfusion with some Lasix on 11/11/2017, creatinine seems to have plateaued around 2.6, currently appears to be intravascularly dehydrated, will give a run of LR total of 1.5 L on 11/12/2017 and monitor urine output closely along with renal function.  3.  Metabolic and toxic encephalopathy.  Due to #1  and 2 above.  Improving.  Supportive care, remains at risk for delirium, minimize narcotics and benzodiazepines.  Palliative care on board.  Currently DNR but husband wants to pursue present line of care.  4.  History of gout.  On allopurinol.  5.  Essential hypertension.  Blood pressure currently low, Lopressor has been discontinued currently on midodrine.  6. Dyslipidemia.  Continue home dose statin.  7.  Dysphagia.  Speech therapy on board currently on dysphagia 1 diet.    8.  Diffuse weakness,  deconditioning.  Bedbound status was 20 at home with minimal ambulation with walker at times.  PT initiated.    9.  Iron deficiency microcytic anemia along with acute anemia of acute sickness.  No signs of active bleeding, most of the anemia is due to multiple blood draws on top of microcytic iron deficiency anemia, transfused 2 units of packed RBC on 11/11/2017, anemia panel noted placed on IV Iron + long term oral iron and monitor.  Placed on Zantac instead of PPI due to recent C. difficile.    10. DM type II.  Currently on sliding scale, oral intake is poor we will continue to monitor.  Lab Results  Component Value Date   HGBA1C 6.7 (H) 05/24/2017   CBG (last 3)  Recent Labs    11/12/17 0044 11/12/17 0501 11/12/17 0520  GLUCAP 247* 212* 196*     Family Communication  :  Husband Bedside  Code Status :  DNR  Disposition Plan  :  SNF  Consults  :  Renal, PCCM, Pall Care  Procedures  :     [facility] catheter placement on 11/03/2017  CRRT initiated on 11/03/2017  2 Units PRBCs 11/11/17  DVT Prophylaxis  :  Heparin added  Lab Results  Component Value Date   PLT 143 (L) 11/12/2017    Diet :  Diet Order            DIET - DYS 1 Room service appropriate? Yes; Fluid consistency: Thin  Diet effective now               Inpatient Medications Scheduled Meds: . Chlorhexidine Gluconate Cloth  6 each Topical Daily  . feeding supplement (PRO-STAT SUGAR FREE 64)  30 mL Oral TID WC    . ferrous sulfate  325 mg Oral BID WC  . heparin injection (subcutaneous)  5,000 Units Subcutaneous Q8H  . insulin aspart  0-9 Units Subcutaneous Q4H  . mouth rinse  15 mL Mouth Rinse BID  . midodrine  5 mg Oral TID WC  . nystatin  5 mL Oral QID  . ranitidine  150 mg Oral BID  . sodium chloride flush  10-40 mL Intracatheter Q12H   Continuous Infusions: . sodium chloride 20 mL/hr at 11/12/17 0245  . feeding supplement (VITAL AF 1.2 CAL) Stopped (11/09/17 2000)  . lactated ringers     PRN Meds:.acetaminophen, fentaNYL (SUBLIMAZE) injection, ipratropium-albuterol  Antibiotics  :   Anti-infectives (From admission, onward)   Start     Dose/Rate Route Frequency Ordered Stop   11/02/17 1200  cefTRIAXone (ROCEPHIN) 2 g in sodium chloride 0.9 % 100 mL IVPB     2 g 200 mL/hr over 30 Minutes Intravenous Every 24 hours 11/02/17 0856 11/05/17 1245   10/31/17 1200  cefTRIAXone (ROCEPHIN) 1 g in sodium chloride 0.9 % 100 mL IVPB  Status:  Discontinued     1 g 200 mL/hr over 30 Minutes Intravenous Every 24 hours 10/31/17 1005 10/31/17 1056   10/31/17 1200  cefTRIAXone (ROCEPHIN) 2 g in sodium chloride 0.9 % 100 mL IVPB  Status:  Discontinued     2 g 200 mL/hr over 30 Minutes Intravenous Every 24 hours 10/31/17 1056 11/02/17 0856   10/31/17 1000  vancomycin (VANCOCIN) 50 mg/mL oral solution 125 mg  Status:  Discontinued     125 mg Oral 4 times daily 10/31/17 0951 10/31/17 0952   10/30/17 1800  vancomycin (VANCOCIN) 50 mg/mL oral solution 125 mg  Status:  Discontinued  125 mg Oral 4 times daily 10/30/17 1731 10/31/17 0959   10/30/17 1430  ciprofloxacin (CIPRO) IVPB 400 mg  Status:  Discontinued     400 mg 200 mL/hr over 60 Minutes Intravenous Every 24 hours 10/30/17 1415 10/31/17 1048   10/30/17 1430  metroNIDAZOLE (FLAGYL) IVPB 500 mg     500 mg 100 mL/hr over 60 Minutes Intravenous Every 8 hours 10/30/17 1415 11/05/17 2334          Objective:   Vitals:   11/11/17 1850 11/11/17 2015  11/12/17 0042 11/12/17 0450  BP: (!) 139/52 120/61 (!) 139/52 (!) 130/43  Pulse: 75 75 84 74  Resp: 20 (!) 27 (!) 24 (!) 21  Temp: 99 F (37.2 C) 99.3 F (37.4 C) 98.6 F (37 C) 98.8 F (37.1 C)  TempSrc: Oral Oral Oral Oral  SpO2: 93% 95% 96% 97%  Weight:      Height:        Wt Readings from Last 3 Encounters:  11/10/17 108.3 kg  06/01/17 106.9 kg  03/01/17 112.5 kg     Intake/Output Summary (Last 24 hours) at 11/12/2017 1056 Last data filed at 11/12/2017 0650 Gross per 24 hour  Intake 1097.74 ml  Output 275 ml  Net 822.74 ml     Physical Exam  Awake Alert, Oriented X 3, No new F.N deficits, Normal affect [place].AT,PERRAL, appears somewhat dehydrated Supple Neck,No JVD, No cervical lymphadenopathy appriciated.  Symmetrical Chest wall movement, Good air movement bilaterally, CTAB RRR,No Gallops, Rubs or new Murmurs, No Parasternal Heave +ve B.Sounds, Abd Soft, No tenderness, No organomegaly appriciated, No rebound - guarding or rigidity. No Cyanosis, Clubbing,     Data Review:    CBC Recent Labs  Lab 11/07/17 0533 11/08/17 0625 11/10/17 0540 11/11/17 0346 11/11/17 2056 11/12/17 0344  WBC 6.6 7.4 8.1 6.4  --  6.2  HGB 7.5* 7.4* 7.3* 6.6* 8.6* 8.2*  HCT 26.4* 26.3* 25.6* 22.7* 28.3* 27.7*  PLT 131* 117* 115* 128*  --  143*  MCV 75.2* 76.2* 75.1* 74.2*  --  77.2*  MCH 21.4* 21.4* 21.4* 21.6*  --  22.8*  MCHC 28.4* 28.1* 28.5* 29.1*  --  29.6*  RDW 22.5* 23.2* 23.7* 23.6*  --  22.9*    Chemistries  Recent Labs  Lab 11/08/17 0625  11/09/17 0400  11/10/17 0540 11/10/17 1600 11/11/17 0346 11/11/17 2056 11/12/17 0344  NA 138   < > 136   < > 139  136 140 138 137 136  K 4.3   < > 3.9   < > 3.9  3.9 4.1 3.9 4.0 3.5  CL 104   < > 104   < > 105  102 107 108 106 107  CO2 22   < > 25   < > _0 GLUCOSE 179*   < > 207*   < > 195*  188* 174* 167* 265* 212*  BUN 13   < > 15   < > _1 26* 30*  CREATININE 1.55*   < > 1.58*   < >  1.41*  1.36* 1.86* 2.45* 2.64* 2.69*  CALCIUM 7.7*   < > 7.5*   < > 7.9*  7.7* 8.0* 8.3* 8.3* 8.2*  MG 2.4  --  2.5*  --  2.4  --  2.5*  --  2.6*  AST  --   --   --   --  35  --   --   --   --  ALT  --   --   --   --  22  --   --   --   --   ALKPHOS  --   --   --   --  101  --   --   --   --   BILITOT  --   --   --   --  0.7  --   --   --   --    < > = values in this interval not displayed.   ------------------------------------------------------------------------------------------------------------------ No results for input(s): CHOL, HDL, LDLCALC, TRIG, CHOLHDL, LDLDIRECT in the last 72 hours.  Lab Results  Component Value Date   HGBA1C 6.7 (H) 05/24/2017   ------------------------------------------------------------------------------------------------------------------ No results for input(s): TSH, T4TOTAL, T3FREE, THYROIDAB in the last 72 hours.  Invalid input(s): FREET3 ------------------------------------------------------------------------------------------------------------------ No results for input(s): VITAMINB12, FOLATE, FERRITIN, TIBC, IRON, RETICCTPCT in the last 72 hours.  Coagulation profile No results for input(s): INR, PROTIME in the last 168 hours.  No results for input(s): DDIMER in the last 72 hours.  Cardiac Enzymes No results for input(s): CKMB, TROPONINI, MYOGLOBIN in the last 168 hours.  Invalid input(s): CK ------------------------------------------------------------------------------------------------------------------    Component Value Date/Time   BNP 83.7 01/12/2017 2018   BNP 18.5 08/14/2012 0930    Micro Results Recent Results (from the past 240 hour(s))  Urine Culture     Status: None   Collection Time: 11/03/17  1:07 AM  Result Value Ref Range Status   Specimen Description   Final    URINE, CATHETERIZED Performed at [facility] [address], [place], [place] [address]    Special Requests NONE  Final   Culture    Final    NO GROWTH Performed at [facility], [place] [address] [place], [place] [address]    Report Status 11/04/2017 FINAL  Final  MRSA PCR Screening     Status: None   Collection Time: 11/03/17 10:49 AM  Result Value Ref Range Status   MRSA by PCR NEGATIVE NEGATIVE Final    Comment:        The GeneXpert MRSA Assay (FDA approved for NASAL specimens only), is one component of a comprehensive MRSA colonization surveillance program. It is not intended to diagnose MRSA infection nor to guide or monitor treatment for MRSA infections. Performed at [facility], [place] [address] [place], [place] [address]     Radiology Reports Ct Abdomen Pelvis Wo Contrast  Result Date: 10/30/2017 CLINICAL DATA:  Abdominal distension, diarrhea for 5 days, blood in diarrhea, unable to control bladder or bowel, history of adrenal adenoma, diabetes mellitus, hypertension, nephrolithiasis, renal failure EXAM: CT ABDOMEN AND PELVIS WITHOUT CONTRAST TECHNIQUE: Multidetector CT imaging of the abdomen and pelvis was performed following the standard protocol without IV contrast. Sagittal and coronal MPR images reconstructed from axial data set. Patient drank dilute oral contrast for exam COMPARISON:  05/24/2017 FINDINGS: Lower chest: Bibasilar atelectasis and peribronchial thickening Hepatobiliary: Gallbladder surgically absent. No focal hepatic abnormalities Pancreas: Pancreatic atrophy without mass Spleen: Normal appearance Adrenals/Urinary Tract: Low-attenuation LEFT adrenal mass 3.1 x 2.6 cm consistent with adenoma. RIGHT adrenal gland unremarkable. BILATERAL renal cortical atrophy. Exophytic intermediate attenuation mass lateral aspect of inferior RIGHT kidney, 3.7 x 3.6 cm image 56. Minimal prominence of LEFT renal pelvis and LEFT ureter in the pelvis without obstructing calculus. RIGHT ureter and bladder normal appearance. Stomach/Bowel: Appendix surgically absent by history. Scattered  colonic diverticulosis. Wall thickening of sigmoid colon  with mild pericolic infiltrative changes. Additional wall thickening at the rectosigmoid colon. Findings could represent acute diverticulitis or colitis. No abscess or extraluminal gas. Stomach and small bowel loops unremarkable. Vascular/Lymphatic: Atherosclerotic calcifications aorta and iliac arteries. Coronary arterial calcifications. Aorta normal caliber. No adenopathy. Reproductive: Uterus surgically absent.  Normal appearing ovaries. Other: No free air or free fluid. Tiny LEFT parasagittal ventral hernia inferior to the umbilicus. Musculoskeletal: Demineralized with degenerative disc and facet disease changes lumbar spine. IMPRESSION: Diffuse colonic diverticulosis. Wall thickening of the rectosigmoid colon with mild pericolic infiltrative changes, question diverticulitis versus colitis. No evidence of perforation or abscess. LEFT adrenal adenoma 3.1 x 2.6 cm. Grossly unchanged appearance of an exophytic mass 3.7 x 3.6 cm at the lateral RIGHT kidney, question complicated cyst; lesion is unchanged since 05/24/2017 but minimally larger than seen on 02/10/2015 when it measured 3.5 x 3.2 cm, recommended attention on follow-up imaging to establish long-term stability. Electronically Signed   By: Lavonia Dana M.D.   On: 10/30/2017 13:03   Dg Abd 1 View  Result Date: 11/09/2017 CLINICAL DATA:  Patient pulled out nasogastric catheter EXAM: ABDOMEN - 1 VIEW COMPARISON:  11/07/2017 FINDINGS: Scattered large and small bowel gas is noted. The previously seen nasogastric catheter has been removed in the interval. Despite the given clinical history of metal tipped catheter this catheter on previous images represented a standard nasogastric catheter without metallic weighting. No foreign body is noted. IMPRESSION: No evidence of retained foreign body as the prior nasogastric catheter was not a weighted tipped catheter based on previous images. Electronically Signed    By: Inez Catalina M.D.   On: 11/09/2017 19:57   Dg Abd 1 View  Result Date: 11/07/2017 CLINICAL DATA:  84 year old female with feeding tube placement. EXAM: ABDOMEN - 1 VIEW COMPARISON:  Earlier abdominal radiograph dated 11/07/2017 FINDINGS: No significant change in the appearance or positioning of the enteric tube which appears somewhat folded, likely in the mid to distal stomach. IMPRESSION: No significant interval change. Electronically Signed   By: Anner Crete M.D.   On: 11/07/2017 23:00   Dg Abd 1 View  Result Date: 11/03/2017 CLINICAL DATA:  Abdominal distension. EXAM: ABDOMEN - 1 VIEW COMPARISON:  CT, 10/30/2017 FINDINGS: Normal bowel gas pattern with no evidence of obstruction. Soft tissues are not well-defined due to body habitus. No convincing renal or ureteral stones. Status post cholecystectomy. No acute skeletal abnormality. IMPRESSION: 1. No acute findings.  No evidence of bowel obstruction. Electronically Signed   By: Lajean Manes M.D.   On: 11/03/2017 11:17   Ir Fluoro Guide Cv Line Right  Result Date: 11/03/2017 INDICATION: End-stage renal disease. In need intravenous access for the initiation of hemodialysis. EXAM: NON-TUNNELED CENTRAL VENOUS HEMODIALYSIS CATHETER PLACEMENT WITH ULTRASOUND AND FLUOROSCOPIC GUIDANCE COMPARISON:  Chest radiograph - 10/31/2017 MEDICATIONS: None FLUOROSCOPY TIME:  24 seconds (14 mGy) COMPLICATIONS: None immediate. PROCEDURE: Informed written consent was obtained from patient's family after a discussion of the risks, benefits, and alternatives to treatment. Questions regarding the procedure were encouraged and answered. The right neck and chest were prepped with chlorhexidine in a sterile fashion, and a sterile drape was applied covering the operative field. Maximum barrier sterile technique with sterile gowns and gloves were used for the procedure. A timeout was performed prior to the initiation of the procedure. After the overlying soft tissues  were anesthetized, a small venotomy incision was created and a micropuncture kit was utilized to access the internal jugular vein. Real-time ultrasound guidance was utilized  for vascular access including the acquisition of a permanent ultrasound image documenting patency of the accessed vessel. The microwire was utilized to measure appropriate catheter length. A stiff glidewire was advanced to the level of the IVC. Under fluoroscopic guidance, the venotomy was serially dilated, ultimately allowing placement of a 20 cm temporary Trialysis catheter with tip ultimately terminating within the superior aspect of the right atrium. Final catheter positioning was confirmed and documented with a spot radiographic image. The catheter aspirates and flushes normally. The catheter was flushed with appropriate volume heparin dwells. The catheter exit site was secured with a 0-Prolene retention suture. A dressing was placed. The patient tolerated the procedure well without immediate post procedural complication. IMPRESSION: Successful placement of a right internal jugular approach 20 cm temporary dialysis catheter with tip terminating with in the superior aspect of the right atrium. The catheter is ready for immediate use. PLAN: This catheter may be converted to a tunneled dialysis catheter at a later date as indicated. Electronically Signed   By: Sandi Mariscal M.D.   On: 11/03/2017 18:01   Ir US Guide Vasc Access Right  Result Date: 11/03/2017 INDICATION: End-stage renal disease. In need intravenous access for the initiation of hemodialysis. EXAM: NON-TUNNELED CENTRAL VENOUS HEMODIALYSIS CATHETER PLACEMENT WITH ULTRASOUND AND FLUOROSCOPIC GUIDANCE COMPARISON:  Chest radiograph - 10/31/2017 MEDICATIONS: None FLUOROSCOPY TIME:  24 seconds (14 mGy) COMPLICATIONS: None immediate. PROCEDURE: Informed written consent was obtained from patient's family after a discussion of the risks, benefits, and alternatives to treatment.  Questions regarding the procedure were encouraged and answered. The right neck and chest were prepped with chlorhexidine in a sterile fashion, and a sterile drape was applied covering the operative field. Maximum barrier sterile technique with sterile gowns and gloves were used for the procedure. A timeout was performed prior to the initiation of the procedure. After the overlying soft tissues were anesthetized, a small venotomy incision was created and a micropuncture kit was utilized to access the internal jugular vein. Real-time ultrasound guidance was utilized for vascular access including the acquisition of a permanent ultrasound image documenting patency of the accessed vessel. The microwire was utilized to measure appropriate catheter length. A stiff glidewire was advanced to the level of the IVC. Under fluoroscopic guidance, the venotomy was serially dilated, ultimately allowing placement of a 20 cm temporary Trialysis catheter with tip ultimately terminating within the superior aspect of the right atrium. Final catheter positioning was confirmed and documented with a spot radiographic image. The catheter aspirates and flushes normally. The catheter was flushed with appropriate volume heparin dwells. The catheter exit site was secured with a 0-Prolene retention suture. A dressing was placed. The patient tolerated the procedure well without immediate post procedural complication. IMPRESSION: Successful placement of a right internal jugular approach 20 cm temporary dialysis catheter with tip terminating with in the superior aspect of the right atrium. The catheter is ready for immediate use. PLAN: This catheter may be converted to a tunneled dialysis catheter at a later date as indicated. Electronically Signed   By: Sandi Mariscal M.D.   On: 11/03/2017 18:01   Dg Chest Port 1 View  Result Date: 11/09/2017 CLINICAL DATA:  Patient removed recent nasogastric catheter EXAM: PORTABLE CHEST 1 VIEW COMPARISON:   11/08/2017 FINDINGS: Cardiac shadow is enlarged. Right jugular central line is again seen and stable. Inspiratory effort is poor with crowding of the vascular markings. No focal confluent infiltrate is seen. No radiopaque foreign body is noted. The previously seen nasogastric catheter has  been removed and as previously described was not a weighted tipped catheter. IMPRESSION: Poor inspiratory effort.  No acute abnormality noted. Electronically Signed   By: Inez Catalina M.D.   On: 11/09/2017 19:58   Dg Chest Port 1 View  Result Date: 11/08/2017 CLINICAL DATA:  Respiratory failure. EXAM: PORTABLE CHEST 1 VIEW COMPARISON:  11/07/2017 and older exams. FINDINGS: Mild enlargement of the cardiopericardial silhouette, stable. Prominent bronchovascular markings, with additional lung base opacity, the latter consistent with atelectasis, also without change from the previous day's exam. New nasal/orogastric tube passes below the included field of view, likely into the stomach. Right subclavian dual lumen central venous catheter is stable, tip in the right atrium. No pneumothorax. IMPRESSION: 1. No significant change in lung aeration from the previous day's study. There are prominent bronchovascular markings and lung base atelectasis, but no convincing pulmonary edema or pneumonia. 2. New nasal/orogastric tube. Tip not visualized, but likely extends into the stomach. Electronically Signed   By: Lajean Manes M.D.   On: 11/08/2017 07:13   Dg Chest Port 1 View  Result Date: 11/07/2017 CLINICAL DATA:  Acute respiratory failure with hypoxia. EXAM: PORTABLE CHEST 1 VIEW COMPARISON:  10/31/2017. FINDINGS: Cardiomegaly. Improved lung volumes. Increased perihilar markings could represent early edema or viral pneumonitis. Other than improved lung markings, similar appearance to priors. Dialysis catheter has been inserted via RIGHT IJ approach. Tip slide in the RIGHT atrium. There is no pneumothorax. IMPRESSION: Satisfactory  catheter placement. No significant change in the appearance of the lungs except for improved volume. Cardiomegaly. Electronically Signed   By: Staci Righter M.D.   On: 11/07/2017 11:30   Dg Chest Port 1 View  Result Date: 10/31/2017 CLINICAL DATA:  84 year old female with shortness of breath. EXAM: PORTABLE CHEST 1 VIEW COMPARISON:  Portable chest 10/30/2017 and earlier. CT Abdomen and Pelvis 10/30/2017. FINDINGS: There did not appear to be overt pulmonary edema at the lung bases yesterday by CT. Lower lobe bronchiectasis and peribronchial thickening was noted. Continued low lung volumes stable appearance of the pulmonary interstitium today from May this year. Stable mild cardiomegaly. Visualized tracheal air column is within normal limits. No pneumothorax, pleural effusion or confluent opacity. Large body habitus. No acute osseous abnormality identified. IMPRESSION: Persistent low lung volumes. No focal pulmonary abnormality. Consider viral or atypical respiratory infection given the appearance of the lung bases on CT Abdomen and Pelvis yesterday. Electronically Signed   By: Genevie Ann M.D.   On: 10/31/2017 13:18   Dg Chest Port 1 View  Result Date: 10/30/2017 CLINICAL DATA:  84 year old female with constipation and watery diarrhea. Initial encounter. EXAM: PORTABLE CHEST 1 VIEW COMPARISON:  05/27/2017. FINDINGS: Cardiomegaly. Asymmetric mild pulmonary edema superimposed upon chronic changes. Calcified aorta. IMPRESSION: 1. Asymmetric mild pulmonary edema. 2. Cardiomegaly. 3.  Aortic Atherosclerosis (ICD10-I70.0). Electronically Signed   By: Genia Del M.D.   On: 10/30/2017 15:06   Dg Abd Portable 1v  Result Date: 11/07/2017 CLINICAL DATA:  Nasogastric tube placement. EXAM: PORTABLE ABDOMEN - 1 VIEW COMPARISON:  11/03/2017 FINDINGS: Interval nasogastric tube folded back upon itself in the mid to distal stomach with its tip in the mid stomach. The included bowel gas pattern is normal. Prominent  interstitial markings at the left lung base. Lower thoracic spine degenerative changes. IMPRESSION: Nasogastric tube folded back upon itself in the mid to distal stomach with its tip in the mid stomach. Electronically Signed   By: Claudie Revering M.D.   On: 11/07/2017 13:13    Time  Spent in minutes  30   Lala Lund M.D on 11/12/2017 at 10:56 AM  To page go to [email or web address] - password [email or web address]

## 2017-11-12 NOTE — Progress Notes (Addendum)
Inpatient Diabetes Program Recommendations  AACE/ADA: New Consensus Statement on Inpatient Glycemic Control (2015)  Target Ranges:  Prepandial:   less than 140 mg/dL      Peak postprandial:   less than 180 mg/dL (1-2 hours)      Critically ill patients:  140 - 180 mg/dL   Results for Lauren Crosby, Lauren Crosby (MRN [id number]) as of 11/12/2017 13:10  Ref. Range 11/12/2017 00:44 11/12/2017 05:01 11/12/2017 05:20 11/12/2017 12:24  Glucose-Capillary Latest Ref Range: 70 - 99 mg/dL 247 (H) 212 (H) 196 (H) 210 (H)     Home DM Meds: 70/30 Insulin- 45 units AM/ 50 units PM  Current Orders: Novolog Sensitive Correction Scale/ SSI (0-9 units) Q4 hours     Poor PO intake noted per MD notes--Getting Dysphagic diet.  CBGs >200 mg/dl.    MD- If within goals of care for patient, please consider the following:  1. Increase Novolog SSI to Moderate scale (0-15 units) Q4 hours  2. Start low dose basal insulin- Recommend Lantus 10 units QHS (0.1 units/kg dosing based on weight of 108 kg--Takes 70/30 Insulin at home but 70/30 Insulin not ideal for patients with poor PO intake    --Will follow patient during hospitalization--  Wyn Quaker RN, MSN, CDE Diabetes Coordinator Inpatient Glycemic Control Team Team Pager: [phone] (8a-5p)

## 2017-11-12 NOTE — Progress Notes (Signed)
[facility] NEPHROLOGY PROGRESS NOTE  Assessment/ Plan: Pt is a 84 y.o. yo female with history of type 2 diabetes, CKD stage III, hypertension, hyperlipidemia, obesity who was admitted on 10/22 with C. difficile colitis and diarrhea.  Patient developed septic shock and AKI requiring initiation of CRRT from 10/26-11/02/2017 at [facility] ICU. She was transferred to [facility] on 11/11/2017.   #Acute kidney injury in the setting of septic shock causing ATN: Patient required CRRT for metabolic acidosis and volume management.  She is off of CRRT since 11/2.  Urine output is only recorded as 275 cc and serum creatinine level stable at 2.6 today.  Potassium and CO2 level acceptable.  Patient with decreased oral intake, starting IV fluid.  Monitor urine output and BMP.  Avoid nephrotoxins.  For now continue to keep dialysis catheter. -CT scan of abdomen pelvis showed bilateral renal cortical atrophy, right kidney with exophytic mass which is unchanged since 05/2017 and minimally larger than in 02/2015.  Recommend outpatient follow-up.  #Hypotension: Blood pressure acceptable.  Currently on midodrine 5 3 times daily.  Volume status looks acceptable.  #Anemia: Received 2 unit of blood transfusion on 11/3. Receiving IV iron today. I will order aranesp 100 mcg.   #C. difficile colitis: Clinically improving.  #Metabolic acidosis improved.  #Encephalopathy: Patient was sleeping this afternoon but able to arouse with the name.  Husband at bedside.  Subjective: Seen and examined at bedside.  Patient was sleeping but able to wake up with the name.  Denied nausea vomiting or chest pain.  Patient husband at bedside. Objective Vital signs in last 24 hours: Vitals:   11/11/17 1850 11/11/17 2015 11/12/17 0042 11/12/17 0450  BP: (!) 139/52 120/61 (!) 139/52 (!) 130/43  Pulse: 75 75 84 74  Resp: 20 (!) 27 (!) 24 (!) 21  Temp: 99 F (37.2 C) 99.3 F (37.4 C) 98.6 F (37 C) 98.8 F (37.1 C)  TempSrc: Oral Oral  Oral Oral  SpO2: 93% 95% 96% 97%  Weight:      Height:       Weight change:   Intake/Output Summary (Last 24 hours) at 11/12/2017 1301 Last data filed at 11/12/2017 0650 Gross per 24 hour  Intake 949.91 ml  Output 275 ml  Net 674.91 ml       Labs: Basic Metabolic Panel: Recent Labs  Lab 11/11/17 0346 11/11/17 2056 11/12/17 0344  NA 138 137 136  K 3.9 4.0 3.5  CL 108 106 107  CO2 25 26 26   GLUCOSE 167* 265* 212*  BUN 19 26* 30*  CREATININE 2.45* 2.64* 2.69*  CALCIUM 8.3* 8.3* 8.2*  PHOS 3.5 4.6 5.0*   Liver Function Tests: Recent Labs  Lab 11/10/17 0540  11/11/17 0346 11/11/17 2056 11/12/17 0344  AST 35  --   --   --   --   ALT 22  --   --   --   --   ALKPHOS 101  --   --   --   --   BILITOT 0.7  --   --   --   --   PROT 6.3*  --   --   --   --   ALBUMIN 2.7*  2.7*   < > 2.4* 2.4* 2.4*   < > = values in this interval not displayed.   No results for input(s): LIPASE, AMYLASE in the last 168 hours. No results for input(s): AMMONIA in the last 168 hours. CBC: Recent Labs  Lab 11/07/17  0511 11/08/17 0625 11/10/17 0540 11/11/17 0346 11/11/17 2056 11/12/17 0344  WBC 6.6 7.4 8.1 6.4  --  6.2  HGB 7.5* 7.4* 7.3* 6.6* 8.6* 8.2*  HCT 26.4* 26.3* 25.6* 22.7* 28.3* 27.7*  MCV 75.2* 76.2* 75.1* 74.2*  --  77.2*  PLT 131* 117* 115* 128*  --  143*   Cardiac Enzymes: No results for input(s): CKTOTAL, CKMB, CKMBINDEX, TROPONINI in the last 168 hours. CBG: Recent Labs  Lab 11/11/17 2026 11/12/17 0044 11/12/17 0501 11/12/17 0520 11/12/17 1224  GLUCAP 231* 247* 212* 196* 210*    Iron Studies: No results for input(s): IRON, TIBC, TRANSFERRIN, FERRITIN in the last 72 hours. Studies/Results: No results found.  Medications: Infusions: . sodium chloride 20 mL/hr at 11/12/17 0245  . feeding supplement (VITAL AF 1.2 CAL) Stopped (11/09/17 2000)  . ferumoxytol 510 mg (11/12/17 1258)  . lactated ringers 125 mL/hr at 11/12/17 1240    Scheduled  Medications: . Chlorhexidine Gluconate Cloth  6 each Topical Daily  . feeding supplement (PRO-STAT SUGAR FREE 64)  30 mL Oral TID WC  . ferrous sulfate  325 mg Oral BID WC  . heparin injection (subcutaneous)  5,000 Units Subcutaneous Q8H  . insulin aspart  0-9 Units Subcutaneous Q4H  . mouth rinse  15 mL Mouth Rinse BID  . midodrine  5 mg Oral TID WC  . nystatin  5 mL Oral QID  . ranitidine  150 mg Oral BID  . sodium chloride flush  10-40 mL Intracatheter Q12H    have reviewed scheduled and prn medications.  Physical Exam: General:NAD, comfortable Heart:RRR, s1s2 nl Lungs:clear b/l, no cracjle Abdomen:soft, Non-tender, non-distended Extremities:No edema Dialysis Access: temporary catheter site looks clean.  Jacier Gladu Prasad Delfin Squillace 11/12/2017,1:01 PM  LOS: 12 days

## 2017-11-12 NOTE — Evaluation (Signed)
Physical Therapy Evaluation Patient Details Name: Lauren Crosby MRN: [id number] DOB: 24-Sep-1931 Today's Date: 11/12/2017   History of Present Illness  Patient is an 84 y/o female presenting to the ED on 10/30/17 with primary complaints of diarrhea. CT scan of the abdomen and pelvis revealed wall thickening of the rectosigmoid colon with mild pericolonic infiltrative changes question diverticulitis versus colitis. Transfer to ICU on 11/03/17 for HD/CRRT. Transfer from [facility] to [facility] on 11/10/17. PMH significant for diabetes mellitus, chronic kidney disease stage III hypertension, hyperlipidemia, morbid obesity, chronic lower extremity swelling    Clinical Impression  Lauren Crosby admitted with the above listed diagnosis. Patient lethargic today, but willing to participate at bed level. Husband reporting limited mobility prior to admission. Patient today requiring Max/Total A +2 for bed level mobility with required verbal and tactile cueing for initiation and self assistance. Will recommend SNF at discharge to continue to progress safe functional mobility. PT to follow acutely.     Follow Up Recommendations SNF;Supervision/Assistance - 24 hour    Equipment Recommendations  None recommended by PT    Recommendations for Other Services OT consult     Precautions / Restrictions Precautions Precautions: Fall Restrictions Weight Bearing Restrictions: No      Mobility  Bed Mobility Overal bed mobility: Needs Assistance Bed Mobility: Rolling Rolling: Max assist;Total assist;+2 for physical assistance         General bed mobility comments: Max A/Total A +2 for bed level mobility; does seem to initiate, however needs considerable physical assist  Transfers                 General transfer comment: deferred  Ambulation/Gait                Stairs            Wheelchair Mobility    Modified Rankin (Stroke Patients Only)       Balance                                              Pertinent Vitals/Pain Pain Assessment: No/denies pain    Home Living Family/patient expects to be discharged to:: Private residence Living Arrangements: Spouse/significant other;Children Available Help at Discharge: Family;Available 24 hours/day Type of Home: House Home Access: Level entry     Home Layout: One level Home Equipment: Walker - 2 wheels;Wheelchair - Insurance claims handler - 4 wheels      Prior Function Level of Independence: Needs assistance   Gait / Transfers Assistance Needed: walks ~50 feet with RW PTA  ADL's / Homemaking Assistance Needed: spouse assists with all ADL's        Hand Dominance   Dominant Hand: Right    Extremity/Trunk Assessment   Upper Extremity Assessment Upper Extremity Assessment: Defer to OT evaluation    Lower Extremity Assessment Lower Extremity Assessment: Generalized weakness       Communication   Communication: Expressive difficulties  Cognition Arousal/Alertness: Lethargic Behavior During Therapy: Flat affect Overall Cognitive Status: Difficult to assess                                        General Comments General comments (skin integrity, edema, etc.): lethargic, but does awaken to name; once began rolling much more alert    Exercises  Assessment/Plan    PT Assessment Patient needs continued PT services  PT Problem List Decreased strength;Decreased activity tolerance;Decreased balance;Decreased mobility;Decreased knowledge of use of DME;Decreased safety awareness       PT Treatment Interventions DME instruction;Gait training;Functional mobility training;Therapeutic activities;Therapeutic exercise;Balance training;Neuromuscular re-education;Patient/family education    PT Goals (Current goals can be found in the Care Plan section)  Acute Rehab PT Goals Patient Stated Goal: none stated PT Goal Formulation: With patient/family Time For Goal  Achievement: 11/26/17 Potential to Achieve Goals: Fair    Frequency Min 2X/week   Barriers to discharge        Co-evaluation               AM-PAC PT "6 Clicks" Daily Activity  Outcome Measure Difficulty turning over in bed (including adjusting bedclothes, sheets and blankets)?: Unable Difficulty moving from lying on back to sitting on the side of the bed? : Unable Difficulty sitting down on and standing up from a chair with arms (e.g., wheelchair, bedside commode, etc,.)?: Unable Help needed moving to and from a bed to chair (including a wheelchair)?: Total Help needed walking in hospital room?: Total Help needed climbing 3-5 steps with a railing? : Total 6 Click Score: 6    End of Session Equipment Utilized During Treatment: Oxygen Activity Tolerance: Patient limited by fatigue;Patient limited by lethargy Patient left: in bed;with call bell/phone within reach;with nursing/sitter in room;with family/visitor present Nurse Communication: Mobility status PT Visit Diagnosis: Unsteadiness on feet (R26.81);Other abnormalities of gait and mobility (R26.89);Muscle weakness (generalized) (M62.81)    Time: 7473-4037 PT Time Calculation (min) (ACUTE ONLY): 29 min   Charges:   PT Evaluation $PT Eval Moderate Complexity: 1 Mod PT Treatments $Therapeutic Activity: 8-22 mins       Lanney Gins, PT, DPT Supplemental Physical Therapist 11/12/17 11:25 AM Pager: [phone] Office: [phone]

## 2017-11-12 NOTE — Progress Notes (Signed)
Nutrition Follow-up  DOCUMENTATION CODES:   Morbid obesity  INTERVENTION:   - d/c orders for tube feeding given diet advancement and lack of enteral access  - Magic cup TID with meals, each supplement provides 290 kcal and 9 grams of protein  - Pro-stat 30 ml BID, each supplement provides 100 kcal and 15 grams of protein  NUTRITION DIAGNOSIS:   Inadequate oral intake related to lethargy/confusion, dysphagia as evidenced by meal completion < 50%, other (per RN report).  New diagnosis  GOAL:   Patient will meet greater than or equal to 90% of their needs  Progressing  MONITOR:   PO intake, Supplement acceptance, Diet advancement, Weight trends, I & O's, Skin  REASON FOR ASSESSMENT:   Consult Enteral/tube feeding initiation and management  ASSESSMENT:   84 year-old female with hx of morbid obesity, DM, CRI. She was admitted on 10/30/17 with diarrhea and worsening renal insufficiency. She has been diagnosed with c.diff colitis, metabolic acidosis, and worsening mentation as of 10/26.  D/t worsening mentation, she was transferred to ICU and HD cath was placed with the initiation of CRRT on the evening of 10/26.  11/1 - pt pulled NG tube 11/2 - CRRT d/c 11/3 - transferred to [facility] in anticipation of iHD, diet advanced to Dysphagia 1, thin liquids  Noted therapies recommending SNF. Spoke with RN who reports pt's husband is concerned about pt getting too much iron.  Spoke with pt's husband at bedside. Pt with eyes closed at time of visit. Pt's husband states that he is feeing pt from her meal tray. Noted ~50% of lunch meal tray completed. Per pt's husband, pt still working on [employer]. Pt's husband expressed concern with pt getting too much iron stating, "this is what killed her mother."  Meal Completion: 50% since diet advanced on 11/3  Medications reviewed and include: Pro-stat 30 ml TID, ferrous sulfate 325 mg BID, sliding scale Novolog, Lactated ringers @ 125 ml/hr  Labs  reviewed: BUN 30 (H), creatinine 2.69 (H), phosphorus 5.0 (H), magnesium 2.6 (H) CBG's: 210, 196, 212, 247, 231, 238 x 24 hours  UOP: 275 ml x 24 hours I/O's: -2.2 L since admit  Diet Order:   Diet Order            DIET - DYS 1 Room service appropriate? Yes; Fluid consistency: Thin  Diet effective now              EDUCATION NEEDS:   No education needs have been identified at this time  Skin:  Skin Assessment: Reviewed RN Assessment (MASD to groin, buttocks)  Last BM:  11/3  Height:   Ht Readings from Last 1 Encounters:  11/09/17 5\' 2"  (1.575 m)    Weight:   Wt Readings from Last 1 Encounters:  11/10/17 108.3 kg    Ideal Body Weight:  50 kg  BMI:  Body mass index is 43.67 kg/m.  Estimated Nutritional Needs:   Kcal:  2000-2200  Protein:  105-120 grams  Fluid:  >/= 2.0 L    Gaynell Face, MS, RD, LDN Inpatient Clinical Dietitian Pager: [phone] Weekend/After Hours: [phone]

## 2017-11-12 NOTE — Progress Notes (Signed)
[facility] Progress Note    Assessment/ Plan:   84yo female with PMH of insulin dependent DM, CKDIII, HTN, HLD, obesity presented with diarrhea, weakness and AKI thought to be due to volume depletion meeting criteria for septic shock d/t C diff colitis. Started CRRT 10/26-11/2 due to oliguria and worsening volume status. Transferred to [facility] 11/2 for likely initiation of intermittent HD after discussion with patient and family.  1. AKI on CKD - s/p CRRT via RIJ 10/26-11/2. Cr 3.96>>7.92>>2.69. WEX93. BUN 30.  Mental status improved with CRRT and continues to have little UOP, 0.1 ml/kg/hr and net+1L last 24 hours. Will continue to engage family in discussion about initiating HD given DNR status. 2. HTN/Volume status - intermittently hypertensive overnight with lower diastolic BP. Lower BP seen with HD, continue Midrin with dialysis. 3. Cdiff Colitis - antibiotic course completed. Stools improving. 4. Microcytic anemia - evidence of blood loss in stool, now s/p 2u pRBC 11/3. Hb appropriately increased and stable at 8.2. 5. DM2 - per primary 6. AMS - thought due to lack of perfusion from septic shock and metabolic abnormalities. Improving per husband although not quite back to baseline. Easily arousable, oriented to person, year.   Subjective:   Patient endorses some abdominal pain today. Husband states patient not quite to MS baseline, but improving. Patient and husband have not discussed possibility of initiating HD since transfer. Drinking without difficulty, tolerating pureed diet.   Objective:   BP (!) 130/43 (BP Location: Right Arm)   Pulse 74   Temp 98.8 F (37.1 C) (Oral)   Resp (!) 21   Ht 5\' 2"  (1.575 m)   Wt 108.3 kg   SpO2 97%   BMI 43.67 kg/m   Intake/Output Summary (Last 24 hours) at 11/12/2017 0836 Last data filed at 11/12/2017 0650 Gross per 24 hour  Intake 1294.69 ml  Output 275 ml  Net 1019.69 ml   Weight change:   Physical Exam: ZJI:[id number] obese,  chronically ill appearing female, lying in bed asleep, easily arousable. CVS: RRR, no murmur Resp: distant breath sounds, appropriately saturated on 2L Abd: soft, TTP diffusely, +BS Ext: 1+ pitting edema to knees bilaterally Neuro: Easily arousable, oriented to person, year. States [place] for place, doesn't know month.  Imaging: No results found.  Labs: BMET Recent Labs  Lab 11/09/17 0400 11/09/17 1603 11/10/17 0540 11/10/17 1600 11/11/17 0346 11/11/17 2056 11/12/17 0344  NA 136 137 139  136 140 138 137 136  K 3.9 3.9 3.9  3.9 4.1 3.9 4.0 3.5  CL 104 105 105  102 107 108 106 107  CO2 25 24 26  25 25 25 26 26   GLUCOSE 207* 240* 195*  188* 174* 167* 265* 212*  BUN 15 17 16  16 19 19  26* 30*  CREATININE 1.58* 1.48* 1.41*  1.36* 1.86* 2.45* 2.64* 2.69*  CALCIUM 7.5* 7.7* 7.9*  7.7* 8.0* 8.3* 8.3* 8.2*  PHOS 2.3* 1.9* 2.3*  2.3* 3.0 3.5 4.6 5.0*   CBC Recent Labs  Lab 11/08/17 0625 11/10/17 0540 11/11/17 0346 11/11/17 2056 11/12/17 0344  WBC 7.4 8.1 6.4  --  6.2  HGB 7.4* 7.3* 6.6* 8.6* 8.2*  HCT 26.3* 25.6* 22.7* 28.3* 27.7*  MCV 76.2* 75.1* 74.2*  --  77.2*  PLT 117* 115* 128*  --  143*    Medications:    . Chlorhexidine Gluconate Cloth  6 each Topical Daily  . feeding supplement (PRO-STAT SUGAR FREE 64)  30 mL Oral TID WC  .  ferrous sulfate  325 mg Oral BID WC  . heparin injection (subcutaneous)  5,000 Units Subcutaneous Q8H  . insulin aspart  0-9 Units Subcutaneous Q4H  . mouth rinse  15 mL Mouth Rinse BID  . midodrine  5 mg Oral TID WC  . nystatin  5 mL Oral QID  . ranitidine  150 mg Oral BID  . sodium chloride flush  10-40 mL Intracatheter Q12H      Rory Percy, DO PGY2 11/12/2017, 8:36 AM

## 2017-11-12 NOTE — Progress Notes (Signed)
PMT progress note  Patient is resting in bed, she is some what alert, opens eyes, tracks me in the room, attempts to communicate with husband who is at the bedside.   BP (!) 130/43 (BP Location: Right Arm)   Pulse 74   Temp 98.8 F (37.1 C) (Oral)   Resp (!) 21   Ht 5\' 2"  (1.575 m)   Wt 108.3 kg   SpO2 97%   BMI 43.67 kg/m   Labs and imaging noted Chart reviewed.   Awake somewhat alert Diminished breath sounds Has some edema Abdomen  Non tender S1-S2  Scant amount of dark concentrated urine noted in Foley bag  Palliative performance scale 20-30 percent.   84 year old lady who lives at home with her husband, has 6 children, patient was initially admitted to [facility] with diarrhea, renal insufficiency, probable C. Difficile colitis and metabolic acidosis. Patient has underlying diabetes, morbid obesity and chronic renal insufficiency.  Patient's hospital course at [facility] was complicated by worsening metabolic acidosis, worsening mental status, worsening renal insufficiency to the point of the patient being started on CRRT. Pulmonary critical care medicine colleagues were following, palliative care has been following along for goals of care discussions.  Patient was transferred to [facility] for further attempts at hemodialysis as per patient and family's wishes.  At present, the patient has CRRT paused, very minimal urine output noted. She also underwent 2 units packed red blood cell transfusions since transfer to: Hospital.  Palliative medicine team following up after her transfer.  Patient is resting in bed. She appears weak. She is somewhat alert. Husband is present at the bedside.  Goals of care: Patient and family wish to continue with hemodialysis. Gently initiated discussions with husband about what long-term dialysis entails. At the present time, patient's husband does not wish to discuss further. He wishes to continue to focus on the  patient's recovery.  Palliative medicine team will hence forth follow peripherally and engage when deemed appropriate. Goals at this time, appear to be continuation of efforts at renal recovery including dialysis. It remains to be seen how the patient will tolerate dialysis and how her outpatient dialysis plan will looked like because she still remains very debilitated.  Offered active listening and supportive care to husband.   25 minutes spent.   Loistine Chance MD [facility] health palliative medicine team [phone]

## 2017-11-12 NOTE — Progress Notes (Signed)
  Speech Language Pathology Treatment: Dysphagia  Patient Details Name: TAM SAVOIA MRN: [id number] DOB: 25-Aug-1931 Today's Date: 11/12/2017 Time: 8250-0370 SLP Time Calculation (min) (ACUTE ONLY): 21 min  Assessment / Plan / Recommendation Clinical Impression  Pt extremely lethargic requiring max multimodal cues during PO intake; delayed cough after second intake of thin via straw; puree appeared [facility] as well as, smaller sips of thin; family/staff noted pt had been lethargic entire day with limited intake; pt refused to place dentures in oral cavity for mastication of solids; d/t pt mentation/level of lethargy, it is recommended she continue Dysphagia 1 (puree)/thin liquids via small sips with straw; ST will continue to f/u for progression of diet as pt mentation improves during acute stay.  HPI HPI: 84 year old with past medical history relevant for type 2 diabetes on insulin, 3 CKD, hypertension, hyperlipidemia, chronic bilateral lower extremity weakness, class III obesity admitted on 10/30/2017 with C. difficile colitis and diarrhea and diffuse weakness with a prolonged hospital course complicated by septic shock and AKI requiring initiation of CRRT and ICU stay and encephalopathy.      SLP Plan  Continue with current plan of care       Recommendations  Diet recommendations: Dysphagia 1 (puree);Thin liquid Liquids provided via: Cup;Straw Medication Administration: Whole meds with puree Supervision: Full supervision/cueing for compensatory strategies Compensations: Small sips/bites;Slow rate Postural Changes and/or Swallow Maneuvers: Seated upright 90 degrees;Upright 30-60 min after meal                Oral Care Recommendations: Oral care BID Follow up Recommendations: None SLP Visit Diagnosis: Dysphagia, oral phase (R13.11) Plan: Continue with current plan of care                       Elvina Sidle, M.S., CCC-SLP 11/12/2017, 4:20 PM

## 2017-11-13 ENCOUNTER — Inpatient Hospital Stay (HOSPITAL_COMMUNITY): Payer: Medicare HMO

## 2017-11-13 LAB — MAGNESIUM: Magnesium: 2.4 mg/dL (ref 1.7–2.4)

## 2017-11-13 LAB — RENAL FUNCTION PANEL
ALBUMIN: 2.4 g/dL — AB (ref 3.5–5.0)
ANION GAP: 8 (ref 5–15)
BUN: 44 mg/dL — AB (ref 8–23)
CO2: 24 mmol/L (ref 22–32)
Calcium: 8.3 mg/dL — ABNORMAL LOW (ref 8.9–10.3)
Chloride: 104 mmol/L (ref 98–111)
Creatinine, Ser: 2.63 mg/dL — ABNORMAL HIGH (ref 0.44–1.00)
GFR calc Af Amer: 18 mL/min — ABNORMAL LOW (ref >60)
GFR calc non Af Amer: 16 mL/min — ABNORMAL LOW (ref >60)
GLUCOSE: 197 mg/dL — AB (ref 70–99)
POTASSIUM: 4.8 mmol/L (ref 3.5–5.1)
Phosphorus: 4.9 mg/dL — ABNORMAL HIGH (ref 2.5–4.6)
Sodium: 136 mmol/L (ref 135–145)

## 2017-11-13 LAB — CBC
HCT: 28.7 % — ABNORMAL LOW (ref 36.0–46.0)
Hemoglobin: 8.3 g/dL — ABNORMAL LOW (ref 12.0–15.0)
MCH: 22.4 pg — AB (ref 26.0–34.0)
MCHC: 28.9 g/dL — ABNORMAL LOW (ref 30.0–36.0)
MCV: 77.4 fL — AB (ref 80.0–100.0)
NRBC: 0.3 % — AB (ref 0.0–0.2)
PLATELETS: 136 10*3/uL — AB (ref 150–400)
RBC: 3.71 MIL/uL — AB (ref 3.87–5.11)
RDW: 23.7 % — ABNORMAL HIGH (ref 11.5–15.5)
WBC: 7.4 10*3/uL (ref 4.0–10.5)

## 2017-11-13 LAB — GLUCOSE, CAPILLARY
GLUCOSE-CAPILLARY: 180 mg/dL — AB (ref 70–99)
GLUCOSE-CAPILLARY: 241 mg/dL — AB (ref 70–99)
Glucose-Capillary: 183 mg/dL — ABNORMAL HIGH (ref 70–99)
Glucose-Capillary: 271 mg/dL — ABNORMAL HIGH (ref 70–99)
Glucose-Capillary: 184 mg/dL — ABNORMAL HIGH (ref 70–99)
Glucose-Capillary: 188 mg/dL — ABNORMAL HIGH (ref 70–99)

## 2017-11-13 LAB — BRAIN NATRIURETIC PEPTIDE: B Natriuretic Peptide: 810.9 pg/mL — ABNORMAL HIGH (ref 0.0–100.0)

## 2017-11-13 MED ORDER — ALBUMIN HUMAN 25 % IV SOLN
25.0000 g | Freq: Four times a day (QID) | INTRAVENOUS | Status: AC
  Administered 2017-11-13 – 2017-11-14 (×3): 25 g via INTRAVENOUS
  Filled 2017-11-13 (×4): qty 50

## 2017-11-13 MED ORDER — FUROSEMIDE 10 MG/ML IJ SOLN
40.0000 mg | Freq: Once | INTRAMUSCULAR | Status: AC
  Administered 2017-11-13: 40 mg via INTRAVENOUS
  Filled 2017-11-13: qty 4

## 2017-11-13 NOTE — Progress Notes (Signed)
PROGRESS NOTE                                                                                                                                                                                                             Patient Demographics:    Lauren Crosby, is a 84 y.o. female, DOB - 07-15-1931, BWL:[id number]  Admit date - 10/30/2017   Admitting Physician Costin Karlyne Greenspan, MD  Outpatient Primary MD for the patient is [name], MD  LOS - 13  Chief Complaint  Patient presents with  . Diarrhea       Brief Narrative  84 year old with past medical history relevant for type 2 diabetes on insulin, 3 CKD, hypertension, hyperlipidemia, chronic bilateral lower extremity weakness, class III obesity admitted on 10/30/2017 with C. difficile colitis and diarrhea and diffuse weakness with a prolonged hospital course complicated by septic shock and AKI requiring initiation of CRRT and ICU stay and encephalopathy.   Subjective:   Patient in bed, appears comfortable, denies any headache, no fever, no chest pain or pressure, no shortness of breath , no abdominal pain. No focal weakness.    Assessment  & Plan :     1.  Septic shock, hypotension, acute renal failure caused by C. difficile colitis.  She was briefly in the ICU at [facility] and underwent few sessions of CRRT via right IJ dialysis catheter.  Nephrology following.  Shock physiology and C diff colitis have clinically has resolved. She has finished her antibiotic course.  2.  ARF due to #1 above currently oliguric.  Nephrology on board, had to undergo dialysis sessions at [facility] long hospital this admission, status post 2 units of transfusion with some Lasix on 11/11/2017, creatinine seems to have plateaued around 2.6, still appears clinically dehydrated to me despite giving her fluids on 11/12/2017, discussed with nephrologist Dr. Carolin Sicks who requested 40 mg of IV Lasix on 11/13/2017 and he will monitor the urine  output, continue Foley catheter for close intake and output monitoring.  Staff also counseled to monitor urine output closely.  So far 300 cc of urine output in the last 200 hours..  3.  Metabolic and toxic encephalopathy.  Due to #1 and 2 above.  Improving.  Supportive care, remains at risk for delirium, minimize narcotics and benzodiazepines.  Palliative care on board.  Currently DNR but husband wants to pursue present line of care.  4.  History of gout.  On allopurinol.  5.  Essential hypertension.  Blood  pressure currently low, Lopressor has been discontinued currently on midodrine.  6. Dyslipidemia.  Continue home dose statin.  7.  Dysphagia.  Speech therapy on board currently on dysphagia 1 diet.    8.  Diffuse weakness, deconditioning.  Bedbound status was 20 at home with minimal ambulation with walker at times.  PT initiated.    9.  Iron deficiency microcytic anemia along with acute anemia of acute sickness.  No signs of active bleeding, most of the anemia is due to multiple blood draws on top of microcytic iron deficiency anemia, transfused 2 units of packed RBC on 11/11/2017, anemia panel noted placed on PO + IV Iron dosed by pharmacy.  Placed on Zantac instead of PPI due to recent C. difficile.    10. DM type II.  Currently on sliding scale, oral intake is poor we will continue to monitor.  Lab Results  Component Value Date   HGBA1C 6.7 (H) 05/24/2017   CBG (last 3)  Recent Labs    11/13/17 0035 11/13/17 0444 11/13/17 0750  GLUCAP 241* 180* 183*     Family Communication  :  Husband Bedside  Code Status :  DNR  Disposition Plan  :  SNF  Consults  :  Renal, PCCM, Pall Care  Procedures  :     [facility] catheter placement on 11/03/2017  CRRT initiated on 11/03/2017  2 Units PRBCs 11/11/17  DVT Prophylaxis  :  Heparin added  Lab Results  Component Value Date   PLT 136 (L) 11/13/2017    Diet :  Diet Order            DIET - DYS 1 Room service appropriate? Yes;  Fluid consistency: Thin  Diet effective now               Inpatient Medications Scheduled Meds: . Chlorhexidine Gluconate Cloth  6 each Topical Daily  . feeding supplement (PRO-STAT SUGAR FREE 64)  30 mL Oral BID BM  . ferrous sulfate  325 mg Oral BID WC  . furosemide  40 mg Intravenous Once  . heparin injection (subcutaneous)  5,000 Units Subcutaneous Q8H  . insulin aspart  0-9 Units Subcutaneous Q4H  . mouth rinse  15 mL Mouth Rinse BID  . midodrine  5 mg Oral TID WC  . nystatin  5 mL Oral QID  . ranitidine  150 mg Oral BID  . sodium chloride flush  10-40 mL Intracatheter Q12H   Continuous Infusions: . sodium chloride 20 mL/hr at 11/12/17 0245   PRN Meds:.acetaminophen, fentaNYL (SUBLIMAZE) injection, ipratropium-albuterol  Antibiotics  :   Anti-infectives (From admission, onward)   Start     Dose/Rate Route Frequency Ordered Stop   11/02/17 1200  cefTRIAXone (ROCEPHIN) 2 g in sodium chloride 0.9 % 100 mL IVPB     2 g 200 mL/hr over 30 Minutes Intravenous Every 24 hours 11/02/17 0856 11/05/17 1245   10/31/17 1200  cefTRIAXone (ROCEPHIN) 1 g in sodium chloride 0.9 % 100 mL IVPB  Status:  Discontinued     1 g 200 mL/hr over 30 Minutes Intravenous Every 24 hours 10/31/17 1005 10/31/17 1056   10/31/17 1200  cefTRIAXone (ROCEPHIN) 2 g in sodium chloride 0.9 % 100 mL IVPB  Status:  Discontinued     2 g 200 mL/hr over 30 Minutes Intravenous Every 24 hours 10/31/17 1056 11/02/17 0856   10/31/17 1000  vancomycin (VANCOCIN) 50 mg/mL oral solution 125 mg  Status:  Discontinued     125 mg  Oral 4 times daily 10/31/17 0951 10/31/17 0952   10/30/17 1800  vancomycin (VANCOCIN) 50 mg/mL oral solution 125 mg  Status:  Discontinued     125 mg Oral 4 times daily 10/30/17 1731 10/31/17 0959   10/30/17 1430  ciprofloxacin (CIPRO) IVPB 400 mg  Status:  Discontinued     400 mg 200 mL/hr over 60 Minutes Intravenous Every 24 hours 10/30/17 1415 10/31/17 1048   10/30/17 1430  metroNIDAZOLE  (FLAGYL) IVPB 500 mg     500 mg 100 mL/hr over 60 Minutes Intravenous Every 8 hours 10/30/17 1415 11/05/17 2334          Objective:   Vitals:   11/12/17 1820 11/12/17 2018 11/13/17 0036 11/13/17 0805  BP: (!) 146/50     Pulse: 69     Resp: (!) 24     Temp: 98.7 F (37.1 C) 98.7 F (37.1 C) 99.1 F (37.3 C) 98.1 F (36.7 C)  TempSrc: Oral Oral Oral Oral  SpO2: 95%     Weight:      Height:        Wt Readings from Last 3 Encounters:  11/10/17 108.3 kg  06/01/17 106.9 kg  03/01/17 112.5 kg     Intake/Output Summary (Last 24 hours) at 11/13/2017 1007 Last data filed at 11/13/2017 0936 Gross per 24 hour  Intake 612.24 ml  Output 425 ml  Net 187.24 ml     Physical Exam  Awake  No new F.N deficits,   [place].AT,PERRAL Supple Neck,No JVD, No cervical lymphadenopathy appriciated.  Symmetrical Chest wall movement, Good air movement bilaterally, CTAB RRR,No Gallops, Rubs or new Murmurs, No Parasternal Heave +ve B.Sounds, Abd Soft, No tenderness, No organomegaly appriciated, No rebound - guarding or rigidity. No Cyanosis, Clubbing or edema, No new Rash or bruise     Data Review:    CBC Recent Labs  Lab 11/08/17 0625 11/10/17 0540 11/11/17 0346 11/11/17 2056 11/12/17 0344 11/13/17 0320  WBC 7.4 8.1 6.4  --  6.2 7.4  HGB 7.4* 7.3* 6.6* 8.6* 8.2* 8.3*  HCT 26.3* 25.6* 22.7* 28.3* 27.7* 28.7*  PLT 117* 115* 128*  --  143* 136*  MCV 76.2* 75.1* 74.2*  --  77.2* 77.4*  MCH 21.4* 21.4* 21.6*  --  22.8* 22.4*  MCHC 28.1* 28.5* 29.1*  --  29.6* 28.9*  RDW 23.2* 23.7* 23.6*  --  22.9* 23.7*    Chemistries  Recent Labs  Lab 11/09/17 0400  11/10/17 0540 11/10/17 1600 11/11/17 0346 11/11/17 2056 11/12/17 0344 11/13/17 0320 11/13/17 0340  NA 136   < > 139  136 140 138 137 136  --  136  K 3.9   < > 3.9  3.9 4.1 3.9 4.0 3.5  --  4.8  CL 104   < > 105  102 107 108 106 107  --  104  CO2 25   < > 26  25 25 25 26 26   --  24  GLUCOSE 207*   < > 195*  188* 174*  167* 265* 212*  --  197*  BUN 15   < > 16  16 19 19  26* 30*  --  44*  CREATININE 1.58*   < > 1.41*  1.36* 1.86* 2.45* 2.64* 2.69*  --  2.63*  CALCIUM 7.5*   < > 7.9*  7.7* 8.0* 8.3* 8.3* 8.2*  --  8.3*  MG 2.5*  --  2.4  --  2.5*  --  2.6* 2.4  --  AST  --   --  35  --   --   --   --   --   --   ALT  --   --  22  --   --   --   --   --   --   ALKPHOS  --   --  101  --   --   --   --   --   --   BILITOT  --   --  0.7  --   --   --   --   --   --    < > = values in this interval not displayed.   ------------------------------------------------------------------------------------------------------------------ No results for input(s): CHOL, HDL, LDLCALC, TRIG, CHOLHDL, LDLDIRECT in the last 72 hours.  Lab Results  Component Value Date   HGBA1C 6.7 (H) 05/24/2017   ------------------------------------------------------------------------------------------------------------------ No results for input(s): TSH, T4TOTAL, T3FREE, THYROIDAB in the last 72 hours.  Invalid input(s): FREET3 ------------------------------------------------------------------------------------------------------------------ No results for input(s): VITAMINB12, FOLATE, FERRITIN, TIBC, IRON, RETICCTPCT in the last 72 hours.  Coagulation profile No results for input(s): INR, PROTIME in the last 168 hours.  No results for input(s): DDIMER in the last 72 hours.  Cardiac Enzymes No results for input(s): CKMB, TROPONINI, MYOGLOBIN in the last 168 hours.  Invalid input(s): CK ------------------------------------------------------------------------------------------------------------------    Component Value Date/Time   BNP 810.9 (H) 11/13/2017 0320   BNP 18.5 08/14/2012 0930    Micro Results Recent Results (from the past 240 hour(s))  MRSA PCR Screening     Status: None   Collection Time: 11/03/17 10:49 AM  Result Value Ref Range Status   MRSA by PCR NEGATIVE NEGATIVE Final    Comment:        The GeneXpert  MRSA Assay (FDA approved for NASAL specimens only), is one component of a comprehensive MRSA colonization surveillance program. It is not intended to diagnose MRSA infection nor to guide or monitor treatment for MRSA infections. Performed at [facility], [place] [address], [place], [place] [address]     Radiology Reports Ct Abdomen Pelvis Wo Contrast  Result Date: 10/30/2017 CLINICAL DATA:  Abdominal distension, diarrhea for 5 days, blood in diarrhea, unable to control bladder or bowel, history of adrenal adenoma, diabetes mellitus, hypertension, nephrolithiasis, renal failure EXAM: CT ABDOMEN AND PELVIS WITHOUT CONTRAST TECHNIQUE: Multidetector CT imaging of the abdomen and pelvis was performed following the standard protocol without IV contrast. Sagittal and coronal MPR images reconstructed from axial data set. Patient drank dilute oral contrast for exam COMPARISON:  05/24/2017 FINDINGS: Lower chest: Bibasilar atelectasis and peribronchial thickening Hepatobiliary: Gallbladder surgically absent. No focal hepatic abnormalities Pancreas: Pancreatic atrophy without mass Spleen: Normal appearance Adrenals/Urinary Tract: Low-attenuation LEFT adrenal mass 3.1 x 2.6 cm consistent with adenoma. RIGHT adrenal gland unremarkable. BILATERAL renal cortical atrophy. Exophytic intermediate attenuation mass lateral aspect of inferior RIGHT kidney, 3.7 x 3.6 cm image 56. Minimal prominence of LEFT renal pelvis and LEFT ureter in the pelvis without obstructing calculus. RIGHT ureter and bladder normal appearance. Stomach/Bowel: Appendix surgically absent by history. Scattered colonic diverticulosis. Wall thickening of sigmoid colon with mild pericolic infiltrative changes. Additional wall thickening at the rectosigmoid colon. Findings could represent acute diverticulitis or colitis. No abscess or extraluminal gas. Stomach and small bowel loops unremarkable. Vascular/Lymphatic: Atherosclerotic  calcifications aorta and iliac arteries. Coronary arterial calcifications. Aorta normal caliber. No adenopathy. Reproductive: Uterus surgically absent.  Normal appearing ovaries. Other: No free air or free fluid. Tiny  LEFT parasagittal ventral hernia inferior to the umbilicus. Musculoskeletal: Demineralized with degenerative disc and facet disease changes lumbar spine. IMPRESSION: Diffuse colonic diverticulosis. Wall thickening of the rectosigmoid colon with mild pericolic infiltrative changes, question diverticulitis versus colitis. No evidence of perforation or abscess. LEFT adrenal adenoma 3.1 x 2.6 cm. Grossly unchanged appearance of an exophytic mass 3.7 x 3.6 cm at the lateral RIGHT kidney, question complicated cyst; lesion is unchanged since 05/24/2017 but minimally larger than seen on 02/10/2015 when it measured 3.5 x 3.2 cm, recommended attention on follow-up imaging to establish long-term stability. Electronically Signed   By: Lavonia Dana M.D.   On: 10/30/2017 13:03   Dg Abd 1 View  Result Date: 11/09/2017 CLINICAL DATA:  Patient pulled out nasogastric catheter EXAM: ABDOMEN - 1 VIEW COMPARISON:  11/07/2017 FINDINGS: Scattered large and small bowel gas is noted. The previously seen nasogastric catheter has been removed in the interval. Despite the given clinical history of metal tipped catheter this catheter on previous images represented a standard nasogastric catheter without metallic weighting. No foreign body is noted. IMPRESSION: No evidence of retained foreign body as the prior nasogastric catheter was not a weighted tipped catheter based on previous images. Electronically Signed   By: Inez Catalina M.D.   On: 11/09/2017 19:57   Dg Abd 1 View  Result Date: 11/07/2017 CLINICAL DATA:  84 year old female with feeding tube placement. EXAM: ABDOMEN - 1 VIEW COMPARISON:  Earlier abdominal radiograph dated 11/07/2017 FINDINGS: No significant change in the appearance or positioning of the enteric  tube which appears somewhat folded, likely in the mid to distal stomach. IMPRESSION: No significant interval change. Electronically Signed   By: Anner Crete M.D.   On: 11/07/2017 23:00   Dg Abd 1 View  Result Date: 11/03/2017 CLINICAL DATA:  Abdominal distension. EXAM: ABDOMEN - 1 VIEW COMPARISON:  CT, 10/30/2017 FINDINGS: Normal bowel gas pattern with no evidence of obstruction. Soft tissues are not well-defined due to body habitus. No convincing renal or ureteral stones. Status post cholecystectomy. No acute skeletal abnormality. IMPRESSION: 1. No acute findings.  No evidence of bowel obstruction. Electronically Signed   By: Lajean Manes M.D.   On: 11/03/2017 11:17   Ir Fluoro Guide Cv Line Right  Result Date: 11/03/2017 INDICATION: End-stage renal disease. In need intravenous access for the initiation of hemodialysis. EXAM: NON-TUNNELED CENTRAL VENOUS HEMODIALYSIS CATHETER PLACEMENT WITH ULTRASOUND AND FLUOROSCOPIC GUIDANCE COMPARISON:  Chest radiograph - 10/31/2017 MEDICATIONS: None FLUOROSCOPY TIME:  24 seconds (14 mGy) COMPLICATIONS: None immediate. PROCEDURE: Informed written consent was obtained from patient's family after a discussion of the risks, benefits, and alternatives to treatment. Questions regarding the procedure were encouraged and answered. The right neck and chest were prepped with chlorhexidine in a sterile fashion, and a sterile drape was applied covering the operative field. Maximum barrier sterile technique with sterile gowns and gloves were used for the procedure. A timeout was performed prior to the initiation of the procedure. After the overlying soft tissues were anesthetized, a small venotomy incision was created and a micropuncture kit was utilized to access the internal jugular vein. Real-time ultrasound guidance was utilized for vascular access including the acquisition of a permanent ultrasound image documenting patency of the accessed vessel. The microwire was  utilized to measure appropriate catheter length. A stiff glidewire was advanced to the level of the IVC. Under fluoroscopic guidance, the venotomy was serially dilated, ultimately allowing placement of a 20 cm temporary Trialysis catheter with tip ultimately terminating within the  superior aspect of the right atrium. Final catheter positioning was confirmed and documented with a spot radiographic image. The catheter aspirates and flushes normally. The catheter was flushed with appropriate volume heparin dwells. The catheter exit site was secured with a 0-Prolene retention suture. A dressing was placed. The patient tolerated the procedure well without immediate post procedural complication. IMPRESSION: Successful placement of a right internal jugular approach 20 cm temporary dialysis catheter with tip terminating with in the superior aspect of the right atrium. The catheter is ready for immediate use. PLAN: This catheter may be converted to a tunneled dialysis catheter at a later date as indicated. Electronically Signed   By: Sandi Mariscal M.D.   On: 11/03/2017 18:01   Ir US Guide Vasc Access Right  Result Date: 11/03/2017 INDICATION: End-stage renal disease. In need intravenous access for the initiation of hemodialysis. EXAM: NON-TUNNELED CENTRAL VENOUS HEMODIALYSIS CATHETER PLACEMENT WITH ULTRASOUND AND FLUOROSCOPIC GUIDANCE COMPARISON:  Chest radiograph - 10/31/2017 MEDICATIONS: None FLUOROSCOPY TIME:  24 seconds (14 mGy) COMPLICATIONS: None immediate. PROCEDURE: Informed written consent was obtained from patient's family after a discussion of the risks, benefits, and alternatives to treatment. Questions regarding the procedure were encouraged and answered. The right neck and chest were prepped with chlorhexidine in a sterile fashion, and a sterile drape was applied covering the operative field. Maximum barrier sterile technique with sterile gowns and gloves were used for the procedure. A timeout was performed  prior to the initiation of the procedure. After the overlying soft tissues were anesthetized, a small venotomy incision was created and a micropuncture kit was utilized to access the internal jugular vein. Real-time ultrasound guidance was utilized for vascular access including the acquisition of a permanent ultrasound image documenting patency of the accessed vessel. The microwire was utilized to measure appropriate catheter length. A stiff glidewire was advanced to the level of the IVC. Under fluoroscopic guidance, the venotomy was serially dilated, ultimately allowing placement of a 20 cm temporary Trialysis catheter with tip ultimately terminating within the superior aspect of the right atrium. Final catheter positioning was confirmed and documented with a spot radiographic image. The catheter aspirates and flushes normally. The catheter was flushed with appropriate volume heparin dwells. The catheter exit site was secured with a 0-Prolene retention suture. A dressing was placed. The patient tolerated the procedure well without immediate post procedural complication. IMPRESSION: Successful placement of a right internal jugular approach 20 cm temporary dialysis catheter with tip terminating with in the superior aspect of the right atrium. The catheter is ready for immediate use. PLAN: This catheter may be converted to a tunneled dialysis catheter at a later date as indicated. Electronically Signed   By: Sandi Mariscal M.D.   On: 11/03/2017 18:01   Dg Chest Port 1 View  Result Date: 11/13/2017 CLINICAL DATA:  Shortness of breath EXAM: PORTABLE CHEST 1 VIEW COMPARISON:  11/09/2017 FINDINGS: Cardiomegaly and slightly increased pulmonary vascular congestion noted. Mild bibasilar opacities/atelectasis noted in this low volume film. A RIGHT central venous catheter with tips overlying the LOWER SVC/SUPERIOR cavoatrial junction noted. No pneumothorax. IMPRESSION: Cardiomegaly with pulmonary vascular congestion and mild  bibasilar opacities/atelectasis. Electronically Signed   By: Margarette Canada M.D.   On: 11/13/2017 07:49   Dg Chest Port 1 View  Result Date: 11/09/2017 CLINICAL DATA:  Patient removed recent nasogastric catheter EXAM: PORTABLE CHEST 1 VIEW COMPARISON:  11/08/2017 FINDINGS: Cardiac shadow is enlarged. Right jugular central line is again seen and stable. Inspiratory effort is poor with crowding  of the vascular markings. No focal confluent infiltrate is seen. No radiopaque foreign body is noted. The previously seen nasogastric catheter has been removed and as previously described was not a weighted tipped catheter. IMPRESSION: Poor inspiratory effort.  No acute abnormality noted. Electronically Signed   By: Inez Catalina M.D.   On: 11/09/2017 19:58   Dg Chest Port 1 View  Result Date: 11/08/2017 CLINICAL DATA:  Respiratory failure. EXAM: PORTABLE CHEST 1 VIEW COMPARISON:  11/07/2017 and older exams. FINDINGS: Mild enlargement of the cardiopericardial silhouette, stable. Prominent bronchovascular markings, with additional lung base opacity, the latter consistent with atelectasis, also without change from the previous day's exam. New nasal/orogastric tube passes below the included field of view, likely into the stomach. Right subclavian dual lumen central venous catheter is stable, tip in the right atrium. No pneumothorax. IMPRESSION: 1. No significant change in lung aeration from the previous day's study. There are prominent bronchovascular markings and lung base atelectasis, but no convincing pulmonary edema or pneumonia. 2. New nasal/orogastric tube. Tip not visualized, but likely extends into the stomach. Electronically Signed   By: Lajean Manes M.D.   On: 11/08/2017 07:13   Dg Chest Port 1 View  Result Date: 11/07/2017 CLINICAL DATA:  Acute respiratory failure with hypoxia. EXAM: PORTABLE CHEST 1 VIEW COMPARISON:  10/31/2017. FINDINGS: Cardiomegaly. Improved lung volumes. Increased perihilar markings  could represent early edema or viral pneumonitis. Other than improved lung markings, similar appearance to priors. Dialysis catheter has been inserted via RIGHT IJ approach. Tip slide in the RIGHT atrium. There is no pneumothorax. IMPRESSION: Satisfactory catheter placement. No significant change in the appearance of the lungs except for improved volume. Cardiomegaly. Electronically Signed   By: Staci Righter M.D.   On: 11/07/2017 11:30   Dg Chest Port 1 View  Result Date: 10/31/2017 CLINICAL DATA:  84 year old female with shortness of breath. EXAM: PORTABLE CHEST 1 VIEW COMPARISON:  Portable chest 10/30/2017 and earlier. CT Abdomen and Pelvis 10/30/2017. FINDINGS: There did not appear to be overt pulmonary edema at the lung bases yesterday by CT. Lower lobe bronchiectasis and peribronchial thickening was noted. Continued low lung volumes stable appearance of the pulmonary interstitium today from May this year. Stable mild cardiomegaly. Visualized tracheal air column is within normal limits. No pneumothorax, pleural effusion or confluent opacity. Large body habitus. No acute osseous abnormality identified. IMPRESSION: Persistent low lung volumes. No focal pulmonary abnormality. Consider viral or atypical respiratory infection given the appearance of the lung bases on CT Abdomen and Pelvis yesterday. Electronically Signed   By: Genevie Ann M.D.   On: 10/31/2017 13:18   Dg Chest Port 1 View  Result Date: 10/30/2017 CLINICAL DATA:  84 year old female with constipation and watery diarrhea. Initial encounter. EXAM: PORTABLE CHEST 1 VIEW COMPARISON:  05/27/2017. FINDINGS: Cardiomegaly. Asymmetric mild pulmonary edema superimposed upon chronic changes. Calcified aorta. IMPRESSION: 1. Asymmetric mild pulmonary edema. 2. Cardiomegaly. 3.  Aortic Atherosclerosis (ICD10-I70.0). Electronically Signed   By: Genia Del M.D.   On: 10/30/2017 15:06   Dg Abd Portable 1v  Result Date: 11/07/2017 CLINICAL DATA:   Nasogastric tube placement. EXAM: PORTABLE ABDOMEN - 1 VIEW COMPARISON:  11/03/2017 FINDINGS: Interval nasogastric tube folded back upon itself in the mid to distal stomach with its tip in the mid stomach. The included bowel gas pattern is normal. Prominent interstitial markings at the left lung base. Lower thoracic spine degenerative changes. IMPRESSION: Nasogastric tube folded back upon itself in the mid to distal stomach with its tip  in the mid stomach. Electronically Signed   By: Claudie Revering M.D.   On: 11/07/2017 13:13    Time Spent in minutes  30   Lala Lund M.D on 11/13/2017 at 10:07 AM  To page go to [email or web address] - password [email or web address]

## 2017-11-13 NOTE — Progress Notes (Signed)
[facility] PROGRESS NOTE  Assessment/ Plan: Pt is a 84 y.o. yo female with history of type 2 diabetes, CKD stage III, hypertension, hyperlipidemia, obesity who was admitted on 10/22 with C. difficile colitis and diarrhea.  Patient developed septic shock and AKI requiring initiation of CRRT from 10/26-11/02/2017 at [facility]. She was transferred to [facility] on 11/11/2017.   #Acute kidney injury in the setting of septic shock causing ATN: Patient required CRRT for metabolic acidosis and volume management.  She is off of CRRT since 11/2. The serum creatinine level is stable however patient is oliguric today despite of fluid challenge yesterday. BUN has gone up to 44. She has pulmonary congestion in CXR and urine output around 350 cc collected this am with the help of nurse technician. Recommend a dose of IV lasix today and monitor urine output and BMP.  Potassium and CO2 level acceptable.  Avoid nephrotoxins.  For now continue to keep dialysis catheter. No plan for dialysis today. Patient is marginal candidate for long term dialysis.  Palliative care service already evaluated the patient.  The family and patient wished to continue with dialysis if needed. -CT scan of abdomen pelvis showed bilateral renal cortical atrophy, right kidney with exophytic mass which is unchanged since 05/2017 and minimally larger than in 02/2015.  Recommend outpatient follow-up.  #Hypotension: Blood pressure acceptable.  Currently on midodrine 5 tid.    #Anemia: Received 2 unit of blood transfusion on 11/3. Receiving IV iron today. Received aranesp 100 mcg on 11/4.  #C. difficile colitis: Clinically improving.  #Metabolic acidosis improved.  #Encephalopathy: Patient was more alert today but still sluggish.   Subjective: Seen and examined at bedside.  Patient was sitting on chair.  She was alert awake but sluggish.  Denies chest pain or shortness of breath.  Objective Vital signs in last 24 hours: Vitals:    11/12/17 1820 11/12/17 2018 11/13/17 0036 11/13/17 0805  BP: (!) 146/50     Pulse: 69     Resp: (!) 24     Temp: 98.7 F (37.1 C) 98.7 F (37.1 C) 99.1 F (37.3 C) 98.1 F (36.7 C)  TempSrc: Oral Oral Oral Oral  SpO2: 95%     Weight:      Height:       Weight change:   Intake/Output Summary (Last 24 hours) at 11/13/2017 1157 Last data filed at 11/13/2017 0936 Gross per 24 hour  Intake 372.24 ml  Output 425 ml  Net -52.76 ml       Labs: Basic Metabolic Panel: Recent Labs  Lab 11/11/17 2056 11/12/17 0344 11/13/17 0340  NA 137 136 136  K 4.0 3.5 4.8  CL 106 107 104  CO2 26 26 24   GLUCOSE 265* 212* 197*  BUN 26* 30* 44*  CREATININE 2.64* 2.69* 2.63*  CALCIUM 8.3* 8.2* 8.3*  PHOS 4.6 5.0* 4.9*   Liver Function Tests: Recent Labs  Lab 11/10/17 0540  11/11/17 2056 11/12/17 0344 11/13/17 0340  AST 35  --   --   --   --   ALT 22  --   --   --   --   ALKPHOS 101  --   --   --   --   BILITOT 0.7  --   --   --   --   PROT 6.3*  --   --   --   --   ALBUMIN 2.7*  2.7*   < > 2.4* 2.4* 2.4*   < > =  values in this interval not displayed.   No results for input(s): LIPASE, AMYLASE in the last 168 hours. No results for input(s): AMMONIA in the last 168 hours. CBC: Recent Labs  Lab 11/08/17 0625 11/10/17 0540 11/11/17 0346 11/11/17 2056 11/12/17 0344 11/13/17 0320  WBC 7.4 8.1 6.4  --  6.2 7.4  HGB 7.4* 7.3* 6.6* 8.6* 8.2* 8.3*  HCT 26.3* 25.6* 22.7* 28.3* 27.7* 28.7*  MCV 76.2* 75.1* 74.2*  --  77.2* 77.4*  PLT 117* 115* 128*  --  143* 136*   Cardiac Enzymes: No results for input(s): CKTOTAL, CKMB, CKMBINDEX, TROPONINI in the last 168 hours. CBG: Recent Labs  Lab 11/12/17 1655 11/12/17 2017 11/13/17 0035 11/13/17 0444 11/13/17 0750  GLUCAP 224* 205* 241* 180* 183*    Iron Studies: No results for input(s): IRON, TIBC, TRANSFERRIN, FERRITIN in the last 72 hours. Studies/Results: Dg Chest Port 1 View  Result Date: 11/13/2017 CLINICAL DATA:   Shortness of breath EXAM: PORTABLE CHEST 1 VIEW COMPARISON:  11/09/2017 FINDINGS: Cardiomegaly and slightly increased pulmonary vascular congestion noted. Mild bibasilar opacities/atelectasis noted in this low volume film. A RIGHT central venous catheter with tips overlying the LOWER SVC/SUPERIOR cavoatrial junction noted. No pneumothorax. IMPRESSION: Cardiomegaly with pulmonary vascular congestion and mild bibasilar opacities/atelectasis. Electronically Signed   By: Margarette Canada M.D.   On: 11/13/2017 07:49    Medications: Infusions: . sodium chloride 20 mL/hr at 11/12/17 0245  . albumin human      Scheduled Medications: . Chlorhexidine Gluconate Cloth  6 each Topical Daily  . feeding supplement (PRO-STAT SUGAR FREE 64)  30 mL Oral BID BM  . ferrous sulfate  325 mg Oral BID WC  . heparin injection (subcutaneous)  5,000 Units Subcutaneous Q8H  . insulin aspart  0-9 Units Subcutaneous Q4H  . mouth rinse  15 mL Mouth Rinse BID  . midodrine  5 mg Oral TID WC  . nystatin  5 mL Oral QID  . ranitidine  150 mg Oral BID  . sodium chloride flush  10-40 mL Intracatheter Q12H    have reviewed scheduled and prn medications.  Physical Exam: General: Elderly female, sitting in bed Heart:RRR, s1s2 nl Lungs: Coarse breath sound bilateral, no wheezing Abdomen:soft, Non-tender, non-distended Extremities: Trace lower extremity edema. Dialysis Access: temporary catheter site looks clean.  Keysha Damewood Prasad Elishah Ashmore 11/13/2017,11:57 AM  LOS: 13 days

## 2017-11-13 NOTE — Progress Notes (Addendum)
[facility] Progress Note    Assessment/ Plan:   84yo female with PMH of insulin dependent DM, CKDIII, HTN, HLD, obesity presented with diarrhea, weakness and AKI thought to be due to volume depletion meeting criteria for septic shock d/t C diff colitis. Started CRRT 10/26-11/2 due to oliguria and worsening volume status. Transferred to [facility] 11/2 for likely initiation of intermittent HD after discussion with patient and family.  1. AKI on CKD 2/2 septic shock - s/p CRRT via RIJ 10/26-11/2. Cr stable at 2.63. OFB51. BUN 44.  Mental status improved with CRRT. Minimal UOP charted last 24 hours, 60ml total. Was on IVF yesterday with net +522mL last 24 hours, although unclear if accurate totals. Given stable Cr, can hold off of HD today. 2. HTN/Volume status - intermittently hypertensive overnight with lower diastolic BP. Lower BP seen with HD, continue Midrin with dialysis. No crackles appreciated on lung exam however CXR this am indicative of vascular congestion. 3. Cdiff Colitis - antibiotic course completed. Stools improving. 4. Microcytic anemia - Hb stable 8.3 s/p 2u pRBC 11/3. Stools improving.  5. DM2 - per primary 6. AMS - thought due to lack of perfusion from septic shock and metabolic abnormalities. Improving per husband although not quite back to baseline. Easily arousable, oriented to person, year.   Subjective:   Patient states she feels better than yesterday. Husband endorses some UOP but is unsure how much.   Objective:   BP (!) 146/50   Pulse 69   Temp 99.1 F (37.3 C) (Oral)   Resp (!) 24   Ht 5\' 2"  (1.575 m)   Wt 108.3 kg   SpO2 95%   BMI 43.67 kg/m   Intake/Output Summary (Last 24 hours) at 11/13/2017 0258 Last data filed at 11/12/2017 1825 Gross per 24 hour  Intake 612.24 ml  Output 75 ml  Net 537.24 ml   Weight change:   Physical Exam:  Gen: morbidly obese, chronically ill appearing female, lying in bed asleep, easily arousable. CVS: RRR Resp:  distant breath sounds, no rales appreciated, appropriately saturated on 2L Abd: soft, non tender to palpation Ext: 1+ pitting edema to knees bilaterally Neuro: Easily arousable, oriented to person, place. Doesn't know year or month.  Imaging: Dg Chest Port 1 View  Result Date: 11/13/2017 CLINICAL DATA:  Shortness of breath EXAM: PORTABLE CHEST 1 VIEW COMPARISON:  11/09/2017 FINDINGS: Cardiomegaly and slightly increased pulmonary vascular congestion noted. Mild bibasilar opacities/atelectasis noted in this low volume film. A RIGHT central venous catheter with tips overlying the LOWER SVC/SUPERIOR cavoatrial junction noted. No pneumothorax. IMPRESSION: Cardiomegaly with pulmonary vascular congestion and mild bibasilar opacities/atelectasis. Electronically Signed   By: Margarette Canada M.D.   On: 11/13/2017 07:49    Labs: BMET Recent Labs  Lab 11/09/17 1603 11/10/17 0540 11/10/17 1600 11/11/17 0346 11/11/17 2056 11/12/17 0344 11/13/17 0340  NA 137 139  136 140 138 137 136 136  K 3.9 3.9  3.9 4.1 3.9 4.0 3.5 4.8  CL 105 105  102 107 108 106 107 104  CO2 24 26  25 25 25 26 26 24   GLUCOSE 240* 195*  188* 174* 167* 265* 212* 197*  BUN 17 16  16 19 19  26* 30* 44*  CREATININE 1.48* 1.41*  1.36* 1.86* 2.45* 2.64* 2.69* 2.63*  CALCIUM 7.7* 7.9*  7.7* 8.0* 8.3* 8.3* 8.2* 8.3*  PHOS 1.9* 2.3*  2.3* 3.0 3.5 4.6 5.0* 4.9*   CBC Recent Labs  Lab 11/10/17 0540 11/11/17 0346 11/11/17  2056 11/12/17 0344 11/13/17 0320  WBC 8.1 6.4  --  6.2 7.4  HGB 7.3* 6.6* 8.6* 8.2* 8.3*  HCT 25.6* 22.7* 28.3* 27.7* 28.7*  MCV 75.1* 74.2*  --  77.2* 77.4*  PLT 115* 128*  --  143* 136*    Medications:    . Chlorhexidine Gluconate Cloth  6 each Topical Daily  . feeding supplement (PRO-STAT SUGAR FREE 64)  30 mL Oral BID BM  . ferrous sulfate  325 mg Oral BID WC  . heparin injection (subcutaneous)  5,000 Units Subcutaneous Q8H  . insulin aspart  0-9 Units Subcutaneous Q4H  . mouth rinse  15 mL  Mouth Rinse BID  . midodrine  5 mg Oral TID WC  . nystatin  5 mL Oral QID  . ranitidine  150 mg Oral BID  . sodium chloride flush  10-40 mL Intracatheter Q12H      Rory Percy, DO PGY2 11/13/2017, 8:22 AM

## 2017-11-13 NOTE — Evaluation (Signed)
Occupational Therapy Evaluation Patient Details Name: Lauren Crosby MRN: [id number] DOB: 11-07-1931 Today's Date: 11/13/2017    History of Present Illness Patient is an 84 y/o female presenting to the ED on 10/30/17 with primary complaints of diarrhea. CT scan of the abdomen and pelvis revealed wall thickening of the rectosigmoid colon with mild pericolonic infiltrative changes question diverticulitis versus colitis. Transfer to ICU on 11/03/17 for HD/CRRT. Transfer from [facility] to [facility] on 11/10/17. PMH significant for diabetes mellitus, chronic kidney disease stage III hypertension, hyperlipidemia, morbid obesity, chronic lower extremity swelling   Clinical Impression   This 84 y/o female presents with the above. Spouse present start of session and assisting with providing PLOF; reports PTA pt was completing functional mobility with RW, spouse was providing some assist for ADLs, mostly LB ADLs. Pt presenting with significant weakness, deconditioning, and intermittently lethargic during this session. Assisted RN/NT with transfer back to bed from recliner during session. Pt requiring heavy MaxA+2 for sit<>stand at [facility]; currently requiring max-totalA for UB/LB ADLs in sitting/bed level. Pt able to perform self-feeding and simple grooming ADLs with minA given cues/encouragement. Pt will benefit from continued OT services and recommend follow up therapy services in SNF setting after discharge to maximize her safety and independence with ADLs and mobility. Will continue to follow acutely.     Follow Up Recommendations  SNF;Supervision/Assistance - 24 hour    Equipment Recommendations  Other (comment)(to be furthe assessed in next venue)           Precautions / Restrictions Precautions Precautions: Fall Restrictions Weight Bearing Restrictions: No      Mobility Bed Mobility Overal bed mobility: Needs Assistance Bed Mobility: Rolling;Sit to Supine Rolling: Max assist;+2 for physical  assistance     Sit to supine: Max assist;Total assist;+2 for physical assistance;+2 for safety/equipment   General bed mobility comments: Max A+2 for bed level mobility; does seem to initiate, however needs considerable physical assist  Transfers Overall transfer level: Needs assistance   Transfers: Sit to/from Stand Sit to Stand: Max assist;Total assist;+2 physical assistance;+2 safety/equipment         General transfer comment: pt requiring +2Max-totalA for sit<>stand this session with hand over hand assist/multimodal cues for UE placement. Pt unable to achieve full standing posture but able to clear buttocks enough to place chair pads of Stedy; requires strong encouragement as pt declining transfer completion initially and pt also fatigues very easily. use of Stedy for transfer back to EOB    Balance Overall balance assessment: Needs assistance Sitting-balance support: Feet supported Sitting balance-Leahy Scale: Poor Sitting balance - Comments: min-modA sitting balance   Standing balance support: Bilateral upper extremity supported Standing balance-Leahy Scale: Zero Standing balance comment: maxA+2                           ADL either performed or assessed with clinical judgement   ADL Overall ADL's : Needs assistance/impaired Eating/Feeding: Minimal assistance;Set up;Sitting Eating/Feeding Details (indicate cue type and reason): given cues/encouragement pt able to complete self-feeding, though fatigues easily                         Toileting- Clothing Manipulation and Hygiene: Total assistance;+2 for physical assistance;+2 for safety/equipment;Sit to/from stand;Bed level Toileting - Clothing Manipulation Details (indicate cue type and reason): noted to be incontinent of BM while sitting in recliner, due to fatigue with attempts to stand and perform peri-care, returned to bed level  to perform       General ADL Comments: pt eating lunch with assist of  RN upon arrival, noted to be incontinent of BM; pt requiring +2 MaxAssist for sit<>stand at [facility] with additional person assisting with peri-care, due to fatigue required transition to bed level for completion; overall requiring max-totalA for ADLs at this time                         Pertinent Vitals/Pain Pain Assessment: Faces Faces Pain Scale: Hurts whole lot Pain Location: generalized, with transfers and peri-care Pain Descriptors / Indicators: Aching;Discomfort;Grimacing;Crying Pain Intervention(s): Monitored during session;Repositioned     Hand Dominance Right   Extremity/Trunk Assessment Upper Extremity Assessment Upper Extremity Assessment: Generalized weakness   Lower Extremity Assessment Lower Extremity Assessment: Defer to PT evaluation       Communication Communication Communication: Expressive difficulties   Cognition Arousal/Alertness: Lethargic Behavior During Therapy: Flat affect Overall Cognitive Status: Impaired/Different from baseline Area of Impairment: Following commands;Awareness                       Following Commands: Follows one step commands with increased time;Follows one step commands inconsistently   Awareness: Intellectual   General Comments: pt with limited verbalizations this session, though does express wants/needs when asking for certain food items start of session; pt incontinent of BM and unaware   General Comments       Exercises     Shoulder Instructions      Home Living Family/patient expects to be discharged to:: Private residence Living Arrangements: Spouse/significant other;Children Available Help at Discharge: Family;Available 24 hours/day Type of Home: House Home Access: Level entry     Home Layout: One level     Bathroom Shower/Tub: Occupational psychologist: Standard     Home Equipment: Environmental consultant - 2 wheels;Wheelchair - Insurance claims handler - 4 wheels          Prior  Functioning/Environment Level of Independence: Needs assistance  Gait / Transfers Assistance Needed: walks ~50 feet with RW PTA ADL's / Homemaking Assistance Needed: spouse assists with all ADL's, reports mostly LB ADLs            OT Problem List: Decreased strength;Decreased range of motion;Decreased activity tolerance;Impaired balance (sitting and/or standing);Decreased safety awareness;Obesity;Pain;Decreased knowledge of use of DME or AE      OT Treatment/Interventions: Self-care/ADL training;Therapeutic exercise;DME and/or AE instruction;Therapeutic activities;Patient/family education;Balance training    OT Goals(Current goals can be found in the care plan section) Acute Rehab OT Goals Patient Stated Goal: none stated OT Goal Formulation: With patient Time For Goal Achievement: 11/27/17 Potential to Achieve Goals: Fair  OT Frequency: Min 2X/week   Barriers to D/C:            Co-evaluation              AM-PAC PT "6 Clicks" Daily Activity     Outcome Measure Help from another person eating meals?: A Lot Help from another person taking care of personal grooming?: A Lot Help from another person toileting, which includes using toliet, bedpan, or urinal?: Total Help from another person bathing (including washing, rinsing, drying)?: Total Help from another person to put on and taking off regular upper body clothing?: Total Help from another person to put on and taking off regular lower body clothing?: Total 6 Click Score: 8   End of Session Equipment Utilized During Treatment: Oxygen;Gait belt Nurse Communication: Mobility status;Need for  lift equipment  Activity Tolerance: Patient limited by fatigue;Patient limited by lethargy Patient left: in bed;with call bell/phone within reach;with bed alarm set;with family/visitor present;with nursing/sitter in room  OT Visit Diagnosis: Muscle weakness (generalized) (M62.81);Adult, failure to thrive (R62.7)                Time:  8592-7639 OT Time Calculation (min): 54 min Charges:  OT General Charges $OT Visit: 1 Visit OT Evaluation $OT Eval Moderate Complexity: 1 Mod OT Treatments $Self Care/Home Management : 38-52 mins  Lou Cal, OT Supplemental Rehabilitation Services Pager [phone] Office [phone]  Raymondo Band 11/13/2017, 3:53 PM

## 2017-11-13 NOTE — Progress Notes (Signed)
Inpatient Diabetes Program Recommendations  AACE/ADA: New Consensus Statement on Inpatient Glycemic Control (2015)  Target Ranges:  Prepandial:   less than 140 mg/dL      Peak postprandial:   less than 180 mg/dL (1-2 hours)      Critically ill patients:  140 - 180 mg/dL   Lab Results  Component Value Date   GLUCAP 183 (H) 11/13/2017   HGBA1C 6.7 (H) 05/24/2017    Review of Glycemic Control Results for BERNITA, BECKSTROM (MRN [id number]) as of 11/13/2017 10:54  Ref. Range 11/13/2017 00:35 11/13/2017 04:44 11/13/2017 07:50  Glucose-Capillary Latest Ref Range: 70 - 99 mg/dL 241 (H) 180 (H) 183 (H)   Home DM Meds: 70/30 Insulin- 45 units AM/ 50 units PM  Current Orders: Novolog Sensitive Correction Scale/ SSI (0-9 units) Q4 hours Poor PO intake noted per MD notes--Getting Dysphagic diet.  CBGs >200 mg/dl.   MD- If within goals of care for patient, please consider the following:  Start low dose basal insulin- Recommend Lantus 10 units QHS (0.1 units/kg dosing based on weight of 108 kg--Takes 70/30 Insulin at home but 70/30 Insulin not ideal for patients with poor PO intake).  Thanks, Bronson Curb, MSN, RNC-OB Diabetes Coordinator [phone] (8a-5p)

## 2017-11-14 LAB — RENAL FUNCTION PANEL
ALBUMIN: 3.1 g/dL — AB (ref 3.5–5.0)
Anion gap: 9 (ref 5–15)
BUN: 53 mg/dL — AB (ref 8–23)
CHLORIDE: 106 mmol/L (ref 98–111)
CO2: 23 mmol/L (ref 22–32)
Calcium: 9 mg/dL (ref 8.9–10.3)
Creatinine, Ser: 2.16 mg/dL — ABNORMAL HIGH (ref 0.44–1.00)
GFR calc Af Amer: 23 mL/min — ABNORMAL LOW (ref >60)
GFR calc non Af Amer: 20 mL/min — ABNORMAL LOW (ref >60)
GLUCOSE: 211 mg/dL — AB (ref 70–99)
POTASSIUM: 4.6 mmol/L (ref 3.5–5.1)
Phosphorus: 4 mg/dL (ref 2.5–4.6)
Sodium: 138 mmol/L (ref 135–145)

## 2017-11-14 LAB — CBC
HCT: 29 % — ABNORMAL LOW (ref 36.0–46.0)
Hemoglobin: 8.3 g/dL — ABNORMAL LOW (ref 12.0–15.0)
MCH: 21.8 pg — ABNORMAL LOW (ref 26.0–34.0)
MCHC: 28.6 g/dL — AB (ref 30.0–36.0)
MCV: 76.3 fL — ABNORMAL LOW (ref 80.0–100.0)
Platelets: 127 10*3/uL — ABNORMAL LOW (ref 150–400)
RBC: 3.8 MIL/uL — ABNORMAL LOW (ref 3.87–5.11)
RDW: 23.9 % — AB (ref 11.5–15.5)
WBC: 4.5 10*3/uL (ref 4.0–10.5)
nRBC: 0 % (ref 0.0–0.2)

## 2017-11-14 LAB — GLUCOSE, CAPILLARY
GLUCOSE-CAPILLARY: 151 mg/dL — AB (ref 70–99)
GLUCOSE-CAPILLARY: 170 mg/dL — AB (ref 70–99)
GLUCOSE-CAPILLARY: 250 mg/dL — AB (ref 70–99)
GLUCOSE-CAPILLARY: 257 mg/dL — AB (ref 70–99)
Glucose-Capillary: 123 mg/dL — ABNORMAL HIGH (ref 70–99)
Glucose-Capillary: 275 mg/dL — ABNORMAL HIGH (ref 70–99)

## 2017-11-14 LAB — MAGNESIUM: MAGNESIUM: 2.2 mg/dL (ref 1.7–2.4)

## 2017-11-14 MED ORDER — FUROSEMIDE 40 MG PO TABS
40.0000 mg | ORAL_TABLET | Freq: Every day | ORAL | Status: DC
  Administered 2017-11-14 – 2017-11-16 (×3): 40 mg via ORAL
  Filled 2017-11-14 (×3): qty 1

## 2017-11-14 NOTE — Progress Notes (Signed)
PROGRESS NOTE                                                                                                                                                                                                             Patient Demographics:    Lauren Crosby, is a 84 y.o. female, DOB - 02-18-1931, SFS:[id number]  Admit date - 10/30/2017   Admitting Physician Costin Karlyne Greenspan, MD  Outpatient Primary MD for the patient is [name], MD  LOS - 14  Chief Complaint  Patient presents with  . Diarrhea       Brief Narrative  - 84 year old with past medical history relevant for type 2 diabetes on insulin, 3 CKD, hypertension, hyperlipidemia, chronic bilateral lower extremity weakness, class III obesity admitted on 10/30/2017 with C. difficile colitis and diarrhea and diffuse weakness with a prolonged hospital course complicated by septic shock and AKI requiring initiation of CRRT and ICU stay and encephalopathy.   Subjective:   Patient in bed, appears comfortable, denies any headache, no fever, no chest pain or pressure, no shortness of breath , no abdominal pain. No focal weakness.    Assessment  & Plan :     Septic shock,  -This with hypotension, acute renal failure, secondary to infectious process and volume depletion, from C. difficile colitis  -Required brief stay at ICU as long as she required pressors . -Physiology of sepsis has resolved, she has finished her antibiotic course   ARF due to #1 -Secondary to above, she did require CRRT in ICU, she has been off CRRT since 11/10/2017, improving urine output, 1.2 L in the last 24 hours, she was resumed on the basis, Lasix dose by renal. -Renal function recovering, is 2.1 today, discussed with nephrology, no further requirement for dialysis, they have put in order to just continue temporary dialysis catheter.  Metabolic and toxic encephalopathy.   -Due to above,Improving.  Supportive care, remains at risk for delirium,  minimize narcotics and benzodiazepines.  Palliative care on board.  Currently DNR but husband wants to pursue present line of care.  History of gout.  On allopurinol.  Essential hypertension.  Blood pressure currently low, Lopressor has been discontinued currently on midodrine.  Dyslipidemia.  Continue home dose statin.  Dysphagia.  Speech therapy on board currently on dysphagia 1 diet.    Diffuse weakness, deconditioning.  Bedbound status was 20 at home with minimal ambulation with walker at times.  PT initiated.    Iron deficiency microcytic anemia along  with acute anemia of acute sickness.  No signs of active bleeding, most of the anemia is due to multiple blood draws on top of microcytic iron deficiency anemia, transfused 2 units of packed RBC on 11/11/2017, anemia panel noted placed on PO + IV Iron dosed by pharmacy.  Placed on Zantac instead of PPI due to recent C. difficile.    DM type II.  Currently on sliding scale, oral intake is poor we will continue to monitor.  Lab Results  Component Value Date   HGBA1C 6.7 (H) 05/24/2017   CBG (last 3)  Recent Labs    11/14/17 0425 11/14/17 0817 11/14/17 1247  GLUCAP 151* 123* 250*     Family Communication  :  Husband Bedside  Code Status :  DNR  Disposition Plan  :  SNF  Consults  :  Renal, PCCM, Pall Care  Procedures  :     [facility] catheter placement on 11/03/2017  CRRT initiated on 11/03/2017  2 Units PRBCs 11/11/17  DVT Prophylaxis  :  Heparin   Lab Results  Component Value Date   PLT 127 (L) 11/14/2017    Diet :  Diet Order            DIET DYS 2 Room service appropriate? Yes; Fluid consistency: Thin  Diet effective now               Inpatient Medications Scheduled Meds: . Chlorhexidine Gluconate Cloth  6 each Topical Daily  . feeding supplement (PRO-STAT SUGAR FREE 64)  30 mL Oral BID BM  . ferrous sulfate  325 mg Oral BID WC  . furosemide  40 mg Oral Daily  . heparin injection (subcutaneous)  5,000  Units Subcutaneous Q8H  . insulin aspart  0-9 Units Subcutaneous Q4H  . mouth rinse  15 mL Mouth Rinse BID  . midodrine  5 mg Oral TID WC  . nystatin  5 mL Oral QID  . ranitidine  150 mg Oral BID  . sodium chloride flush  10-40 mL Intracatheter Q12H   Continuous Infusions: . sodium chloride 20 mL/hr at 11/12/17 0245   PRN Meds:.acetaminophen, fentaNYL (SUBLIMAZE) injection, ipratropium-albuterol  Antibiotics  :   Anti-infectives (From admission, onward)   Start     Dose/Rate Route Frequency Ordered Stop   11/02/17 1200  cefTRIAXone (ROCEPHIN) 2 g in sodium chloride 0.9 % 100 mL IVPB     2 g 200 mL/hr over 30 Minutes Intravenous Every 24 hours 11/02/17 0856 11/05/17 1245   10/31/17 1200  cefTRIAXone (ROCEPHIN) 1 g in sodium chloride 0.9 % 100 mL IVPB  Status:  Discontinued     1 g 200 mL/hr over 30 Minutes Intravenous Every 24 hours 10/31/17 1005 10/31/17 1056   10/31/17 1200  cefTRIAXone (ROCEPHIN) 2 g in sodium chloride 0.9 % 100 mL IVPB  Status:  Discontinued     2 g 200 mL/hr over 30 Minutes Intravenous Every 24 hours 10/31/17 1056 11/02/17 0856   10/31/17 1000  vancomycin (VANCOCIN) 50 mg/mL oral solution 125 mg  Status:  Discontinued     125 mg Oral 4 times daily 10/31/17 0951 10/31/17 0952   10/30/17 1800  vancomycin (VANCOCIN) 50 mg/mL oral solution 125 mg  Status:  Discontinued     125 mg Oral 4 times daily 10/30/17 1731 10/31/17 0959   10/30/17 1430  ciprofloxacin (CIPRO) IVPB 400 mg  Status:  Discontinued     400 mg 200 mL/hr over 60 Minutes Intravenous Every 24 hours 10/30/17  1415 10/31/17 1048   10/30/17 1430  metroNIDAZOLE (FLAGYL) IVPB 500 mg     500 mg 100 mL/hr over 60 Minutes Intravenous Every 8 hours 10/30/17 1415 11/05/17 2334          Objective:   Vitals:   11/14/17 0100 11/14/17 0500 11/14/17 0900 11/14/17 1300  BP: (!) 124/39 (!) 134/24 (!) 136/51 (!) 158/62  Pulse: 71 68 71 88  Resp: (!) 28 (!) 27 (!) 30 (!) 32  Temp:      TempSrc:      SpO2:  95% 94% 93% (!) 89%  Weight:      Height:        Wt Readings from Last 3 Encounters:  11/10/17 108.3 kg  06/01/17 106.9 kg  03/01/17 112.5 kg     Intake/Output Summary (Last 24 hours) at 11/14/2017 1418 Last data filed at 11/14/2017 0930 Gross per 24 hour  Intake 367.26 ml  Output 1225 ml  Net -857.74 ml     Physical Exam  Awake , conversant, follows simple commands,  Good air entry bilaterally, no wheezing or rhonchi RRR,No Gallops,Rubs or new Murmurs, No Parasternal Heave +ve B.Sounds, Abd Soft, No tenderness, No rebound - guarding or rigidity. No Cyanosis, Clubbing or edema, No new Rash or bruise        Data Review:    CBC Recent Labs  Lab 11/10/17 0540 11/11/17 0346 11/11/17 2056 11/12/17 0344 11/13/17 0320 11/14/17 1051  WBC 8.1 6.4  --  6.2 7.4 4.5  HGB 7.3* 6.6* 8.6* 8.2* 8.3* 8.3*  HCT 25.6* 22.7* 28.3* 27.7* 28.7* 29.0*  PLT 115* 128*  --  143* 136* 127*  MCV 75.1* 74.2*  --  77.2* 77.4* 76.3*  MCH 21.4* 21.6*  --  22.8* 22.4* 21.8*  MCHC 28.5* 29.1*  --  29.6* 28.9* 28.6*  RDW 23.7* 23.6*  --  22.9* 23.7* 23.9*    Chemistries  Recent Labs  Lab 11/10/17 0540  11/11/17 0346 11/11/17 2056 11/12/17 0344 11/13/17 0320 11/13/17 0340 11/14/17 1051  NA 139  136   < > 138 137 136  --  136 138  K 3.9  3.9   < > 3.9 4.0 3.5  --  4.8 4.6  CL 105  102   < > 108 106 107  --  104 106  CO2 26  25   < > 25 26 26   --  24 23  GLUCOSE 195*  188*   < > 167* 265* 212*  --  197* 211*  BUN 16  16   < > 19 26* 30*  --  44* 53*  CREATININE 1.41*  1.36*   < > 2.45* 2.64* 2.69*  --  2.63* 2.16*  CALCIUM 7.9*  7.7*   < > 8.3* 8.3* 8.2*  --  8.3* 9.0  MG 2.4  --  2.5*  --  2.6* 2.4  --  2.2  AST 35  --   --   --   --   --   --   --   ALT 22  --   --   --   --   --   --   --   ALKPHOS 101  --   --   --   --   --   --   --   BILITOT 0.7  --   --   --   --   --   --   --    < > =  values in this interval not displayed.    ------------------------------------------------------------------------------------------------------------------ No results for input(s): CHOL, HDL, LDLCALC, TRIG, CHOLHDL, LDLDIRECT in the last 72 hours.  Lab Results  Component Value Date   HGBA1C 6.7 (H) 05/24/2017   ------------------------------------------------------------------------------------------------------------------ No results for input(s): TSH, T4TOTAL, T3FREE, THYROIDAB in the last 72 hours.  Invalid input(s): FREET3 ------------------------------------------------------------------------------------------------------------------ No results for input(s): VITAMINB12, FOLATE, FERRITIN, TIBC, IRON, RETICCTPCT in the last 72 hours.  Coagulation profile No results for input(s): INR, PROTIME in the last 168 hours.  No results for input(s): DDIMER in the last 72 hours.  Cardiac Enzymes No results for input(s): CKMB, TROPONINI, MYOGLOBIN in the last 168 hours.  Invalid input(s): CK ------------------------------------------------------------------------------------------------------------------    Component Value Date/Time   BNP 810.9 (H) 11/13/2017 0320   BNP 18.5 08/14/2012 0930    Micro Results No results found for this or any previous visit (from the past 240 hour(s)).  Radiology Reports Ct Abdomen Pelvis Wo Contrast  Result Date: 10/30/2017 CLINICAL DATA:  Abdominal distension, diarrhea for 5 days, blood in diarrhea, unable to control bladder or bowel, history of adrenal adenoma, diabetes mellitus, hypertension, nephrolithiasis, renal failure EXAM: CT ABDOMEN AND PELVIS WITHOUT CONTRAST TECHNIQUE: Multidetector CT imaging of the abdomen and pelvis was performed following the standard protocol without IV contrast. Sagittal and coronal MPR images reconstructed from axial data set. Patient drank dilute oral contrast for exam COMPARISON:  05/24/2017 FINDINGS: Lower chest: Bibasilar atelectasis and peribronchial  thickening Hepatobiliary: Gallbladder surgically absent. No focal hepatic abnormalities Pancreas: Pancreatic atrophy without mass Spleen: Normal appearance Adrenals/Urinary Tract: Low-attenuation LEFT adrenal mass 3.1 x 2.6 cm consistent with adenoma. RIGHT adrenal gland unremarkable. BILATERAL renal cortical atrophy. Exophytic intermediate attenuation mass lateral aspect of inferior RIGHT kidney, 3.7 x 3.6 cm image 56. Minimal prominence of LEFT renal pelvis and LEFT ureter in the pelvis without obstructing calculus. RIGHT ureter and bladder normal appearance. Stomach/Bowel: Appendix surgically absent by history. Scattered colonic diverticulosis. Wall thickening of sigmoid colon with mild pericolic infiltrative changes. Additional wall thickening at the rectosigmoid colon. Findings could represent acute diverticulitis or colitis. No abscess or extraluminal gas. Stomach and small bowel loops unremarkable. Vascular/Lymphatic: Atherosclerotic calcifications aorta and iliac arteries. Coronary arterial calcifications. Aorta normal caliber. No adenopathy. Reproductive: Uterus surgically absent.  Normal appearing ovaries. Other: No free air or free fluid. Tiny LEFT parasagittal ventral hernia inferior to the umbilicus. Musculoskeletal: Demineralized with degenerative disc and facet disease changes lumbar spine. IMPRESSION: Diffuse colonic diverticulosis. Wall thickening of the rectosigmoid colon with mild pericolic infiltrative changes, question diverticulitis versus colitis. No evidence of perforation or abscess. LEFT adrenal adenoma 3.1 x 2.6 cm. Grossly unchanged appearance of an exophytic mass 3.7 x 3.6 cm at the lateral RIGHT kidney, question complicated cyst; lesion is unchanged since 05/24/2017 but minimally larger than seen on 02/10/2015 when it measured 3.5 x 3.2 cm, recommended attention on follow-up imaging to establish long-term stability. Electronically Signed   By: Lavonia Dana M.D.   On: 10/30/2017 13:03    Dg Abd 1 View  Result Date: 11/09/2017 CLINICAL DATA:  Patient pulled out nasogastric catheter EXAM: ABDOMEN - 1 VIEW COMPARISON:  11/07/2017 FINDINGS: Scattered large and small bowel gas is noted. The previously seen nasogastric catheter has been removed in the interval. Despite the given clinical history of metal tipped catheter this catheter on previous images represented a standard nasogastric catheter without metallic weighting. No foreign body is noted. IMPRESSION: No evidence of retained foreign body as the prior nasogastric catheter was not  a weighted tipped catheter based on previous images. Electronically Signed   By: Inez Catalina M.D.   On: 11/09/2017 19:57   Dg Abd 1 View  Result Date: 11/07/2017 CLINICAL DATA:  84 year old female with feeding tube placement. EXAM: ABDOMEN - 1 VIEW COMPARISON:  Earlier abdominal radiograph dated 11/07/2017 FINDINGS: No significant change in the appearance or positioning of the enteric tube which appears somewhat folded, likely in the mid to distal stomach. IMPRESSION: No significant interval change. Electronically Signed   By: Anner Crete M.D.   On: 11/07/2017 23:00   Dg Abd 1 View  Result Date: 11/03/2017 CLINICAL DATA:  Abdominal distension. EXAM: ABDOMEN - 1 VIEW COMPARISON:  CT, 10/30/2017 FINDINGS: Normal bowel gas pattern with no evidence of obstruction. Soft tissues are not well-defined due to body habitus. No convincing renal or ureteral stones. Status post cholecystectomy. No acute skeletal abnormality. IMPRESSION: 1. No acute findings.  No evidence of bowel obstruction. Electronically Signed   By: Lajean Manes M.D.   On: 11/03/2017 11:17   Ir Fluoro Guide Cv Line Right  Result Date: 11/03/2017 INDICATION: End-stage renal disease. In need intravenous access for the initiation of hemodialysis. EXAM: NON-TUNNELED CENTRAL VENOUS HEMODIALYSIS CATHETER PLACEMENT WITH ULTRASOUND AND FLUOROSCOPIC GUIDANCE COMPARISON:  Chest radiograph -  10/31/2017 MEDICATIONS: None FLUOROSCOPY TIME:  24 seconds (14 mGy) COMPLICATIONS: None immediate. PROCEDURE: Informed written consent was obtained from patient's family after a discussion of the risks, benefits, and alternatives to treatment. Questions regarding the procedure were encouraged and answered. The right neck and chest were prepped with chlorhexidine in a sterile fashion, and a sterile drape was applied covering the operative field. Maximum barrier sterile technique with sterile gowns and gloves were used for the procedure. A timeout was performed prior to the initiation of the procedure. After the overlying soft tissues were anesthetized, a small venotomy incision was created and a micropuncture kit was utilized to access the internal jugular vein. Real-time ultrasound guidance was utilized for vascular access including the acquisition of a permanent ultrasound image documenting patency of the accessed vessel. The microwire was utilized to measure appropriate catheter length. A stiff glidewire was advanced to the level of the IVC. Under fluoroscopic guidance, the venotomy was serially dilated, ultimately allowing placement of a 20 cm temporary Trialysis catheter with tip ultimately terminating within the superior aspect of the right atrium. Final catheter positioning was confirmed and documented with a spot radiographic image. The catheter aspirates and flushes normally. The catheter was flushed with appropriate volume heparin dwells. The catheter exit site was secured with a 0-Prolene retention suture. A dressing was placed. The patient tolerated the procedure well without immediate post procedural complication. IMPRESSION: Successful placement of a right internal jugular approach 20 cm temporary dialysis catheter with tip terminating with in the superior aspect of the right atrium. The catheter is ready for immediate use. PLAN: This catheter may be converted to a tunneled dialysis catheter at a later  date as indicated. Electronically Signed   By: Sandi Mariscal M.D.   On: 11/03/2017 18:01   Ir US Guide Vasc Access Right  Result Date: 11/03/2017 INDICATION: End-stage renal disease. In need intravenous access for the initiation of hemodialysis. EXAM: NON-TUNNELED CENTRAL VENOUS HEMODIALYSIS CATHETER PLACEMENT WITH ULTRASOUND AND FLUOROSCOPIC GUIDANCE COMPARISON:  Chest radiograph - 10/31/2017 MEDICATIONS: None FLUOROSCOPY TIME:  24 seconds (14 mGy) COMPLICATIONS: None immediate. PROCEDURE: Informed written consent was obtained from patient's family after a discussion of the risks, benefits, and alternatives to treatment. Questions  regarding the procedure were encouraged and answered. The right neck and chest were prepped with chlorhexidine in a sterile fashion, and a sterile drape was applied covering the operative field. Maximum barrier sterile technique with sterile gowns and gloves were used for the procedure. A timeout was performed prior to the initiation of the procedure. After the overlying soft tissues were anesthetized, a small venotomy incision was created and a micropuncture kit was utilized to access the internal jugular vein. Real-time ultrasound guidance was utilized for vascular access including the acquisition of a permanent ultrasound image documenting patency of the accessed vessel. The microwire was utilized to measure appropriate catheter length. A stiff glidewire was advanced to the level of the IVC. Under fluoroscopic guidance, the venotomy was serially dilated, ultimately allowing placement of a 20 cm temporary Trialysis catheter with tip ultimately terminating within the superior aspect of the right atrium. Final catheter positioning was confirmed and documented with a spot radiographic image. The catheter aspirates and flushes normally. The catheter was flushed with appropriate volume heparin dwells. The catheter exit site was secured with a 0-Prolene retention suture. A dressing was  placed. The patient tolerated the procedure well without immediate post procedural complication. IMPRESSION: Successful placement of a right internal jugular approach 20 cm temporary dialysis catheter with tip terminating with in the superior aspect of the right atrium. The catheter is ready for immediate use. PLAN: This catheter may be converted to a tunneled dialysis catheter at a later date as indicated. Electronically Signed   By: Sandi Mariscal M.D.   On: 11/03/2017 18:01   Dg Chest Port 1 View  Result Date: 11/13/2017 CLINICAL DATA:  Shortness of breath EXAM: PORTABLE CHEST 1 VIEW COMPARISON:  11/09/2017 FINDINGS: Cardiomegaly and slightly increased pulmonary vascular congestion noted. Mild bibasilar opacities/atelectasis noted in this low volume film. A RIGHT central venous catheter with tips overlying the LOWER SVC/SUPERIOR cavoatrial junction noted. No pneumothorax. IMPRESSION: Cardiomegaly with pulmonary vascular congestion and mild bibasilar opacities/atelectasis. Electronically Signed   By: Margarette Canada M.D.   On: 11/13/2017 07:49   Dg Chest Port 1 View  Result Date: 11/09/2017 CLINICAL DATA:  Patient removed recent nasogastric catheter EXAM: PORTABLE CHEST 1 VIEW COMPARISON:  11/08/2017 FINDINGS: Cardiac shadow is enlarged. Right jugular central line is again seen and stable. Inspiratory effort is poor with crowding of the vascular markings. No focal confluent infiltrate is seen. No radiopaque foreign body is noted. The previously seen nasogastric catheter has been removed and as previously described was not a weighted tipped catheter. IMPRESSION: Poor inspiratory effort.  No acute abnormality noted. Electronically Signed   By: Inez Catalina M.D.   On: 11/09/2017 19:58   Dg Chest Port 1 View  Result Date: 11/08/2017 CLINICAL DATA:  Respiratory failure. EXAM: PORTABLE CHEST 1 VIEW COMPARISON:  11/07/2017 and older exams. FINDINGS: Mild enlargement of the cardiopericardial silhouette, stable.  Prominent bronchovascular markings, with additional lung base opacity, the latter consistent with atelectasis, also without change from the previous day's exam. New nasal/orogastric tube passes below the included field of view, likely into the stomach. Right subclavian dual lumen central venous catheter is stable, tip in the right atrium. No pneumothorax. IMPRESSION: 1. No significant change in lung aeration from the previous day's study. There are prominent bronchovascular markings and lung base atelectasis, but no convincing pulmonary edema or pneumonia. 2. New nasal/orogastric tube. Tip not visualized, but likely extends into the stomach. Electronically Signed   By: Lajean Manes M.D.   On: 11/08/2017 07:13  Dg Chest Port 1 View  Result Date: 11/07/2017 CLINICAL DATA:  Acute respiratory failure with hypoxia. EXAM: PORTABLE CHEST 1 VIEW COMPARISON:  10/31/2017. FINDINGS: Cardiomegaly. Improved lung volumes. Increased perihilar markings could represent early edema or viral pneumonitis. Other than improved lung markings, similar appearance to priors. Dialysis catheter has been inserted via RIGHT IJ approach. Tip slide in the RIGHT atrium. There is no pneumothorax. IMPRESSION: Satisfactory catheter placement. No significant change in the appearance of the lungs except for improved volume. Cardiomegaly. Electronically Signed   By: Staci Righter M.D.   On: 11/07/2017 11:30   Dg Chest Port 1 View  Result Date: 10/31/2017 CLINICAL DATA:  84 year old female with shortness of breath. EXAM: PORTABLE CHEST 1 VIEW COMPARISON:  Portable chest 10/30/2017 and earlier. CT Abdomen and Pelvis 10/30/2017. FINDINGS: There did not appear to be overt pulmonary edema at the lung bases yesterday by CT. Lower lobe bronchiectasis and peribronchial thickening was noted. Continued low lung volumes stable appearance of the pulmonary interstitium today from May this year. Stable mild cardiomegaly. Visualized tracheal air column is  within normal limits. No pneumothorax, pleural effusion or confluent opacity. Large body habitus. No acute osseous abnormality identified. IMPRESSION: Persistent low lung volumes. No focal pulmonary abnormality. Consider viral or atypical respiratory infection given the appearance of the lung bases on CT Abdomen and Pelvis yesterday. Electronically Signed   By: Genevie Ann M.D.   On: 10/31/2017 13:18   Dg Chest Port 1 View  Result Date: 10/30/2017 CLINICAL DATA:  84 year old female with constipation and watery diarrhea. Initial encounter. EXAM: PORTABLE CHEST 1 VIEW COMPARISON:  05/27/2017. FINDINGS: Cardiomegaly. Asymmetric mild pulmonary edema superimposed upon chronic changes. Calcified aorta. IMPRESSION: 1. Asymmetric mild pulmonary edema. 2. Cardiomegaly. 3.  Aortic Atherosclerosis (ICD10-I70.0). Electronically Signed   By: Genia Del M.D.   On: 10/30/2017 15:06   Dg Abd Portable 1v  Result Date: 11/07/2017 CLINICAL DATA:  Nasogastric tube placement. EXAM: PORTABLE ABDOMEN - 1 VIEW COMPARISON:  11/03/2017 FINDINGS: Interval nasogastric tube folded back upon itself in the mid to distal stomach with its tip in the mid stomach. The included bowel gas pattern is normal. Prominent interstitial markings at the left lung base. Lower thoracic spine degenerative changes. IMPRESSION: Nasogastric tube folded back upon itself in the mid to distal stomach with its tip in the mid stomach. Electronically Signed   By: Claudie Revering M.D.   On: 11/07/2017 13:13      Phillips Climes M.D on 11/14/2017 at 2:18 PM  To page go to [email or web address] - password [email or web address]

## 2017-11-14 NOTE — Progress Notes (Signed)
Right IJ HD cath removed and pressure dressing applied.  Patient has multiple complaints.  Refused to get OOB.  States has aching in "catheter area".  Area assessed.  Patient re-positioned and asleep within 15 minutes.  Refusing Nepro.

## 2017-11-14 NOTE — Progress Notes (Signed)
CSW spoke with patient's spouse. He stated that he does want patient to go to SNF because he cannot lift her. He requested something close to his home. CSW to follow up.  Lauren Locus Clarnce Homan LCSW [phone]

## 2017-11-14 NOTE — Progress Notes (Signed)
  Speech Language Pathology Treatment: Dysphagia  Patient Details Name: RAMONICA GRIGG MRN: [id number] DOB: 1931/10/01 Today's Date: 11/14/2017 Time: 0102-7253 SLP Time Calculation (min) (ACUTE ONLY): 20 min  Assessment / Plan / Recommendation Clinical Impression  Patient was sitting up in bed feeding herself breakfast with husband at bedside. She required assistance with opening packages and minimal verbal cueing to take small sips. She tolerated pureed, thin liquid with no s/s of aspiration, only one immediate cough after sequencial swallow of juice from cup. Pt tolerated crackers with liquid wash to aid in clearance. Upgraded patients diet to Dys 2, thin liquids. Continue full assistance to provide set up and cues for small bites and sips. Speech therapy will continue to monitor for diet tolerance, aspiration precaution training and diet upgrade.    HPI HPI: 84 year old with past medical history relevant for type 2 diabetes on insulin, 3 CKD, hypertension, hyperlipidemia, chronic bilateral lower extremity weakness, class III obesity admitted on 10/30/2017 with C. difficile colitis and diarrhea and diffuse weakness with a prolonged hospital course complicated by septic shock and AKI requiring initiation of CRRT and ICU stay and encephalopathy.      SLP Plan  Continue with current plan of care       Recommendations  Diet recommendations: Dysphagia 2 (fine chop);Thin liquid Liquids provided via: Cup;Straw Medication Administration: Whole meds with puree Supervision: Full supervision/cueing for compensatory strategies Compensations: Small sips/bites;Slow rate Postural Changes and/or Swallow Maneuvers: Seated upright 90 degrees;Upright 30-60 min after meal                Oral Care Recommendations: Oral care BID Follow up Recommendations: None SLP Visit Diagnosis: Dysphagia, oral phase (R13.11) Plan: Continue with current plan of care       [place], MA, CCC-SLP 11/14/2017 10:16 AM

## 2017-11-14 NOTE — Progress Notes (Signed)
[facility] NEPHROLOGY PROGRESS NOTE  Assessment/ Plan: Pt is a 84 y.o. yo female with history of type 2 diabetes, CKD stage III, hypertension, hyperlipidemia, obesity who was admitted on 10/22 with C. difficile colitis and diarrhea.  Patient developed septic shock and AKI requiring initiation of CRRT from 10/26-11/02/2017 at [facility] ICU. She was transferred to [facility] on 11/11/2017.   #Acute kidney injury in the setting of septic shock causing ATN: Patient required CRRT for metabolic acidosis and volume management.  She is off of CRRT since 11/2.  - treated with a dose of IV Lasix and albumin with improvement in serum creatinine level to 2.1 today.  Urine output of 1.2 L in 24 hours.  I do not think patient needs further dialysis therefore discontinue temporary central catheter.  I will start oral Lasix daily to manage lower extremity edema.  If patient being discharged to nursing home, I recommend repeat BMP in a week to monitor electrolytes and kidney function.  -She is a marginal candidate for dialysis.  Already evaluated by palliative care service.  I have discussed with the patient and her husband today.  -CT scan of abdomen pelvis showed bilateral renal cortical atrophy, right kidney with exophytic mass which is unchanged since 05/2017 and minimally larger than in 02/2015.  Recommend outpatient follow-up.  #Hypotension: Blood pressure acceptable.  Currently on midodrine 5 tid.    #Anemia: Received 2 unit of blood transfusion on 11/3. Treated with IV iron. Received aranesp 100 mcg on 11/4.  Monitor CBC.  #C. difficile colitis: Clinically improving.  #Metabolic acidosis improved.  #Encephalopathy: Patient was more alert today.   I will sign off at this time.  Please call back with question.  Discussed with the primary team.  Subjective: Seen and examined at bedside.  She is more alert awake.  Good urine output with improvement in serum creatinine level.  Denies chest pain, shortness of  breath, nausea vomiting.  Objective Vital signs in last 24 hours: Vitals:   11/13/17 2028 11/13/17 2100 11/14/17 0100 11/14/17 0500  BP:  (!) 140/41 (!) 124/39 (!) 134/24  Pulse:  72 71 68  Resp:  15 (!) 28 (!) 27  Temp: 98.2 F (36.8 C)     TempSrc: Oral     SpO2:  96% 95% 94%  Weight:      Height:       Weight change:   Intake/Output Summary (Last 24 hours) at 11/14/2017 1219 Last data filed at 11/14/2017 0930 Gross per 24 hour  Intake 367.26 ml  Output 1225 ml  Net -857.74 ml       Labs: Basic Metabolic Panel: Recent Labs  Lab 11/12/17 0344 11/13/17 0340 11/14/17 1051  NA 136 136 138  K 3.5 4.8 4.6  CL 107 104 106  CO2 26 24 23   GLUCOSE 212* 197* 211*  BUN 30* 44* 53*  CREATININE 2.69* 2.63* 2.16*  CALCIUM 8.2* 8.3* 9.0  PHOS 5.0* 4.9* 4.0   Liver Function Tests: Recent Labs  Lab 11/10/17 0540  11/12/17 0344 11/13/17 0340 11/14/17 1051  AST 35  --   --   --   --   ALT 22  --   --   --   --   ALKPHOS 101  --   --   --   --   BILITOT 0.7  --   --   --   --   PROT 6.3*  --   --   --   --  ALBUMIN 2.7*  2.7*   < > 2.4* 2.4* 3.1*   < > = values in this interval not displayed.   No results for input(s): LIPASE, AMYLASE in the last 168 hours. No results for input(s): AMMONIA in the last 168 hours. CBC: Recent Labs  Lab 11/10/17 0540 11/11/17 0346  11/12/17 0344 11/13/17 0320 11/14/17 1051  WBC 8.1 6.4  --  6.2 7.4 4.5  HGB 7.3* 6.6*   < > 8.2* 8.3* 8.3*  HCT 25.6* 22.7*   < > 27.7* 28.7* 29.0*  MCV 75.1* 74.2*  --  77.2* 77.4* 76.3*  PLT 115* 128*  --  143* 136* 127*   < > = values in this interval not displayed.   Cardiac Enzymes: No results for input(s): CKTOTAL, CKMB, CKMBINDEX, TROPONINI in the last 168 hours. CBG: Recent Labs  Lab 11/13/17 1721 11/13/17 2027 11/14/17 0012 11/14/17 0425 11/14/17 0817  GLUCAP 184* 188* 170* 151* 123*    Iron Studies: No results for input(s): IRON, TIBC, TRANSFERRIN, FERRITIN in the last 72  hours. Studies/Results: Dg Chest Port 1 View  Result Date: 11/13/2017 CLINICAL DATA:  Shortness of breath EXAM: PORTABLE CHEST 1 VIEW COMPARISON:  11/09/2017 FINDINGS: Cardiomegaly and slightly increased pulmonary vascular congestion noted. Mild bibasilar opacities/atelectasis noted in this low volume film. A RIGHT central venous catheter with tips overlying the LOWER SVC/SUPERIOR cavoatrial junction noted. No pneumothorax. IMPRESSION: Cardiomegaly with pulmonary vascular congestion and mild bibasilar opacities/atelectasis. Electronically Signed   By: Margarette Canada M.D.   On: 11/13/2017 07:49    Medications: Infusions: . sodium chloride 20 mL/hr at 11/12/17 0245    Scheduled Medications: . Chlorhexidine Gluconate Cloth  6 each Topical Daily  . feeding supplement (PRO-STAT SUGAR FREE 64)  30 mL Oral BID BM  . ferrous sulfate  325 mg Oral BID WC  . furosemide  40 mg Oral Daily  . heparin injection (subcutaneous)  5,000 Units Subcutaneous Q8H  . insulin aspart  0-9 Units Subcutaneous Q4H  . mouth rinse  15 mL Mouth Rinse BID  . midodrine  5 mg Oral TID WC  . nystatin  5 mL Oral QID  . ranitidine  150 mg Oral BID  . sodium chloride flush  10-40 mL Intracatheter Q12H    have reviewed scheduled and prn medications.  Physical Exam: General: Elderly female, lying on bed comfortable Heart:RRR, s1s2 nl Lungs: Clear anteriorly with decreased basal sound Abdomen:soft, Non-tender, non-distended Extremities: Lower extremity trace edema and chronic skin changes. Dialysis Access: temporary catheter site looks clean.  Plan to remove today.   Prasad  11/14/2017,12:19 PM  LOS: 14 days

## 2017-11-14 NOTE — NC FL2 (Signed)
[place] LEVEL OF CARE SCREENING TOOL     IDENTIFICATION  Patient Name: Lauren Crosby Birthdate: 12/22/1931 Sex: female Admission Date (Current Location): 10/30/2017  [facility] and [place] Number:  Herbalist and Address:  [facility] [place], [place] [address] [place], [place] [address]      Provider Number: [id number]  Attending Physician Name and Address:  Albertine Patricia, MD  Relative Name and Phone Number:  Elenore Rota, spouse, [phone]    Current Level of Care: Hospital Recommended Level of Care: [place] Prior Approval Number:    Date Approved/Denied:   PASRR Number: [id number]  Discharge Plan: SNF    Current Diagnoses: Patient Active Problem List   Diagnosis Date Noted  . Acute respiratory failure with hypoxemia ([place])   . Acute pulmonary edema (HCC)   . Acute encephalopathy   . Palliative care by specialist   . Goals of care, counseling/discussion   . Hyperglycemia due to type 2 diabetes mellitus ([place]) 11/04/2017  . Encephalopathy acute 11/04/2017  . Shock circulatory ([place]) 11/03/2017  . Colitis, acute 10/31/2017  . ATN (acute tubular necrosis) ([place]) 10/31/2017  . Diarrhea 10/30/2017  . CKD (chronic kidney disease) stage 3, GFR 30-59 ml/min (HCC) 10/30/2017  . Pyelitis: Mild per CT 05/24/2017 05/25/2017  . Acidemia   . Abdominal pain 05/24/2017  . Vaginal candidiasis 05/24/2017  . Dermatitis associated with moisture 05/24/2017  . Ileus [facility]): Colonic 05/24/2017  . Acute lower UTI 05/23/2017  . Iron deficiency anemia 05/23/2017  . Pressure injury of buttock, stage 2 ([place]) 05/23/2017  . Acute on chronic anemia   . Hyperkalemia   . Hydronephrosis   . Acute kidney failure ([place]) 05/22/2017  . Metabolic acidosis, normal anion gap (NAG) 05/22/2017  . Hyperkalemia, diminished renal excretion 05/22/2017  . Rhinovirus infection   . AKI (acute kidney injury) ([place])   . HCAP (healthcare-associated  pneumonia) 01/12/2017  . ARF (acute renal failure) ([place]) 01/12/2017  . Shortness of breath   . Retinopathy due to secondary diabetes mellitus ([place])   . Pneumonia   . Obesity   . 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 Nephrolithiasis   . Hypertension   . Hyperlipemia   . Hepatitis   . Gout   . Goiter   . GERD (gastroesophageal reflux disease)   . Diverticulitis   . Diabetes ([place])   . Colon polyp   . Cellulitis and abscess of trunk   . Arthritis   . Chronic anemia   . Adrenal adenoma   . Morbid obesity ([place]) 08/13/2013  . Postoperative anemia due to acute blood loss 08/13/2013  . S/P right TKA 08/11/2013  . Preop cardiovascular exam 08/14/2012  . Chest pain 08/14/2012  . Suture granuloma 02/09/2012  . DYSPNEA ON EXERTION 10/04/2009  . HYPERLIPIDEMIA 04/15/2007  . OBESITY 04/15/2007  . ANEMIA, CHRONIC 04/15/2007  . ANXIETY 04/15/2007  . DEPRESSION 04/15/2007  . HTN (hypertension) 04/15/2007  . GERD 04/15/2007  . DEGENERATIVE JOINT DISEASE 04/15/2007  . HELICOBACTER PYLORI INFECTION, HX OF 04/15/2007  . COLONIC POLYPS 10/04/2006  . DIVERTICULOSIS, COLON 10/04/2006  . REFLUX ESOPHAGITIS 06-Apr-202003  . GASTRITIS, ACUTE 06-Apr-202003    Orientation RESPIRATION BLADDER Height & Weight     Self, Time, Situation, Place  O2(Nasal cannula 2L) Incontinent, Indwelling catheter Weight: 108.3 kg Height:  5\' 2"  (157.5 cm)  BEHAVIORAL SYMPTOMS/MOOD NEUROLOGICAL BOWEL NUTRITION STATUS      Incontinent Diet(Please see DC Summary)  AMBULATORY STATUS COMMUNICATION OF NEEDS Skin   Extensive Assist Verbally Normal                       Personal Care Assistance Level of Assistance  Bathing, Feeding, Dressing Bathing Assistance: Maximum assistance Feeding assistance: Limited assistance Dressing Assistance: Limited assistance     Functional Limitations Info  Sight, Hearing, Speech Sight Info: Adequate Hearing Info: Impaired Speech Info: Adequate    SPECIAL  CARE FACTORS FREQUENCY  PT (By licensed PT), OT (By licensed OT)     PT Frequency: 5x/week OT Frequency: 3x/week            Contractures Contractures Info: Not present    Additional Factors Info  Code Status, Allergies, Isolation Precautions, Insulin Sliding Scale Code Status Info: DNR Allergies Info: Penicillins   Insulin Sliding Scale Info: Every 4 hours Isolation Precautions Info: C Dif precautions     Current Medications (11/14/2017):  This is the current hospital active medication list Current Facility-Administered Medications  Medication Dose Route Frequency Provider Last Rate Last Dose  . 0.9 %  sodium chloride infusion   Intravenous Continuous Charlynne Cousins, MD 20 mL/hr at 11/12/17 0245    . acetaminophen (TYLENOL) tablet 650 mg  650 mg Oral Q6H PRN Charlynne Cousins, MD      . Chlorhexidine Gluconate Cloth 2 % PADS 6 each  6 each Topical Daily Tanda Rockers, MD   6 each at 11/13/17 [phone]  . feeding supplement (PRO-STAT SUGAR FREE 64) liquid 30 mL  30 mL Oral BID BM Thurnell Lose, MD   30 mL at 11/13/17 1514  . fentaNYL (SUBLIMAZE) injection 50 mcg  50 mcg Intravenous Q1H PRN Arby Barrette A, NP   50 mcg at 11/12/17 0247  . ferrous sulfate tablet 325 mg  325 mg Oral BID WC Thurnell Lose, MD   325 mg at 11/14/17 0900  . furosemide (LASIX) tablet 40 mg  40 mg Oral Daily Rosita Fire, MD   40 mg at 11/14/17 1447  . heparin injection 5,000 Units  5,000 Units Subcutaneous Q8H Thurnell Lose, MD   5,000 Units at 11/14/17 1443  . insulin aspart (novoLOG) injection 0-9 Units  0-9 Units Subcutaneous Q4H Purohit, Shrey C, MD   3 Units at 11/14/17 1245  . ipratropium-albuterol (DUONEB) 0.5-2.5 (3) MG/3ML nebulizer solution 3 mL  3 mL Nebulization Q4H PRN Charlynne Cousins, MD      . MEDLINE mouth rinse  15 mL Mouth Rinse BID Erick Colace, NP   15 mL at 11/14/17 1100  . midodrine (PROAMATINE) tablet 5 mg  5 mg Oral TID WC Purohit, Shrey C,  MD   5 mg at 11/14/17 1215  . nystatin (MYCOSTATIN) 100000 UNIT/ML suspension 500,000 Units  5 mL Oral QID Thurnell Lose, MD   500,000 Units at 11/14/17 1450  . ranitidine (ZANTAC) 150 MG/10ML syrup 150 mg  150 mg Oral BID Thurnell Lose, MD   150 mg at 11/14/17 1100  . sodium chloride flush (NS) 0.9 % injection 10-40 mL  10-40 mL Intracatheter Q12H Tanda Rockers, MD   10 mL at 11/13/17 1038     Discharge Medications: Please see discharge summary for a list of discharge medications.  Relevant Imaging Results:  Relevant Lab Results:   Additional Information SSN: [place], [place]

## 2017-11-14 NOTE — Progress Notes (Signed)
Foley bag emptied with volume of 925 ml charted.

## 2017-11-14 NOTE — Clinical Social Work Note (Signed)
Clinical Social Work Assessment  Patient Details  Name: Lauren Crosby MRN: [id number] Date of Birth: September 10, 1931  Date of referral:  11/14/17               Reason for consult:  Facility Placement                Permission sought to share information with:  Facility Sport and exercise psychologist, Family Supports Permission granted to share information::  Yes, Verbal Permission Granted  Name::     English as a second language teacher::  SNFs  Relationship::  Spouse  Contact Information:  [phone]  Housing/Transportation Living arrangements for the past 2 months:  [place] of Information:  Patient Patient Interpreter Needed:  None Criminal Activity/Legal Involvement Pertinent to Current Situation/Hospitalization:  No - Comment as needed Significant Relationships:  Spouse Lives with:  Spouse Do you feel safe going back to the place where you live?  Yes Need for family participation in patient care:  No (Coment)  Care giving concerns:  CSW received consult for possible SNF placement at time of discharge. CSW spoke with patient regarding PT recommendation of SNF placement at time of discharge. Patient reported that she has tried rehab before and it does not do anything. She states her husband helps her with everything. Patient's spouse at bedside but was asleep and did not wake up. CSW to continue to follow and assist with discharge planning needs.   Social Worker assessment / plan:  CSW spoke with patient concerning possibility of rehab at [facility] before returning home.  Employment status:  Retired Nurse, adult PT Recommendations:  [place] / Referral to community resources:  [place]  Patient/Family's Response to care:  Patient states that she does not want to go to rehab and that she can do everything herself. CSW questioned if she was able to transfer herself this morning. Patient hesitated and said that she couldn't but  that was because she was sick. CSW asked if we could speak with her husband about it once he woke up and patient agreed. CSW left SNF list in the room just in case.   Patient/Family's Understanding of and Emotional Response to Diagnosis, Current Treatment, and Prognosis:  Patient/family is realistic regarding therapy needs and expressed being hopeful for return home. Patient expressed understanding of CSW role and discharge process as well as medical condition. No questions/concerns about plan or treatment.    Emotional Assessment Appearance:  Appears stated age Attitude/Demeanor/Rapport:  Other(Appropriate) Affect (typically observed):  Appropriate, Accepting Orientation:  Oriented to Self, Oriented to Place, Oriented to  Time, Oriented to Situation Alcohol / Substance use:  Not Applicable Psych involvement (Current and /or in the community):  No (Comment)  Discharge Needs  Concerns to be addressed:  Care Coordination Readmission within the last 30 days:  No Current discharge risk:  Dependent with Mobility Barriers to Discharge:  Continued Medical Work up   [employer], LCSW 11/14/2017, 11:45 AM

## 2017-11-15 LAB — RENAL FUNCTION PANEL
ALBUMIN: 2.8 g/dL — AB (ref 3.5–5.0)
Anion gap: 6 (ref 5–15)
BUN: 56 mg/dL — AB (ref 8–23)
CO2: 26 mmol/L (ref 22–32)
Calcium: 8.9 mg/dL (ref 8.9–10.3)
Chloride: 106 mmol/L (ref 98–111)
Creatinine, Ser: 2 mg/dL — ABNORMAL HIGH (ref 0.44–1.00)
GFR calc Af Amer: 25 mL/min — ABNORMAL LOW (ref >60)
GFR calc non Af Amer: 22 mL/min — ABNORMAL LOW (ref >60)
GLUCOSE: 199 mg/dL — AB (ref 70–99)
PHOSPHORUS: 3.8 mg/dL (ref 2.5–4.6)
POTASSIUM: 4.9 mmol/L (ref 3.5–5.1)
Sodium: 138 mmol/L (ref 135–145)

## 2017-11-15 LAB — CBC
HCT: 27.5 % — ABNORMAL LOW (ref 36.0–46.0)
Hemoglobin: 8 g/dL — ABNORMAL LOW (ref 12.0–15.0)
MCH: 22.2 pg — ABNORMAL LOW (ref 26.0–34.0)
MCHC: 29.1 g/dL — AB (ref 30.0–36.0)
MCV: 76.2 fL — ABNORMAL LOW (ref 80.0–100.0)
Platelets: 126 10*3/uL — ABNORMAL LOW (ref 150–400)
RBC: 3.61 MIL/uL — ABNORMAL LOW (ref 3.87–5.11)
RDW: 23.7 % — ABNORMAL HIGH (ref 11.5–15.5)
WBC: 4.4 10*3/uL (ref 4.0–10.5)
nRBC: 0 % (ref 0.0–0.2)

## 2017-11-15 LAB — GLUCOSE, CAPILLARY
GLUCOSE-CAPILLARY: 190 mg/dL — AB (ref 70–99)
GLUCOSE-CAPILLARY: 247 mg/dL — AB (ref 70–99)
Glucose-Capillary: 167 mg/dL — ABNORMAL HIGH (ref 70–99)
Glucose-Capillary: 194 mg/dL — ABNORMAL HIGH (ref 70–99)
Glucose-Capillary: 211 mg/dL — ABNORMAL HIGH (ref 70–99)
Glucose-Capillary: 225 mg/dL — ABNORMAL HIGH (ref 70–99)

## 2017-11-15 LAB — MAGNESIUM: MAGNESIUM: 2.1 mg/dL (ref 1.7–2.4)

## 2017-11-15 MED ORDER — RANITIDINE HCL 150 MG/10ML PO SYRP
75.0000 mg | ORAL_SOLUTION | Freq: Two times a day (BID) | ORAL | Status: DC
  Administered 2017-11-15 – 2017-11-16 (×3): 75 mg via ORAL
  Filled 2017-11-15 (×4): qty 10

## 2017-11-15 MED ORDER — RANITIDINE HCL 150 MG/10ML PO SYRP: 150.0000 mg | ORAL_SOLUTION | Freq: Every day | ORAL | Status: DC

## 2017-11-15 MED ORDER — SODIUM POLYSTYRENE SULFONATE 15 GM/60ML PO SUSP
45.0000 g | Freq: Once | ORAL | Status: AC
  Administered 2017-11-15: 45 g via ORAL
  Filled 2017-11-15: qty 180

## 2017-11-15 NOTE — Progress Notes (Signed)
Physical Therapy Treatment Patient Details Name: Lauren Crosby MRN: [id number] DOB: September 08, 1931 Today's Date: 11/15/2017    History of Present Illness Patient is an 84 y/o female presenting to the ED on 10/30/17 with primary complaints of diarrhea. CT scan of the abdomen and pelvis revealed wall thickening of the rectosigmoid colon with mild pericolonic infiltrative changes question diverticulitis versus colitis. Transfer to ICU on 11/03/17 for HD/CRRT. Transfer from [facility] on 11/10/17. PMH significant for diabetes mellitus, chronic kidney disease stage III hypertension, hyperlipidemia, morbid obesity, chronic lower extremity swelling    PT Comments    Patient seen for progression of mobility. Patient up in recliner upon PT arrival. Use of St[place] for sit to stand at recliner and transfer. Requires Max A +2 for transfers and bed level mobility, with consistent cueing provided for safety and sequencing. Good initiation of forward weight shift prior to sit to stand transfer, however requires considerable physical assist to complete successfully. Will continue to recommend SNF.      Follow Up Recommendations  SNF;Supervision/Assistance - 24 hour     Equipment Recommendations  None recommended by PT    Recommendations for Other Services OT consult     Precautions / Restrictions Precautions Precautions: Fall Restrictions Weight Bearing Restrictions: No    Mobility  Bed Mobility Overal bed mobility: Needs Assistance Bed Mobility: Sit to Supine       Sit to supine: Max assist;Total assist;+2 for physical assistance;+2 for safety/equipment   General bed mobility comments: Max/Total A+2 for bed level mobility  Transfers Overall transfer level: Needs assistance   Transfers: Sit to/from Stand Sit to Stand: Max assist;Total assist;+2 physical assistance;+2 safety/equipment         General transfer comment: requires cueing and motivation throughout; easily becomes frustrated  during transfer  Ambulation/Gait                 Stairs             Wheelchair Mobility    Modified Rankin (Stroke Patients Only)       Balance Overall balance assessment: Needs assistance Sitting-balance support: Feet supported Sitting balance-Leahy Scale: Poor Sitting balance - Comments: min-modA sitting balance   Standing balance support: Bilateral upper extremity supported Standing balance-Leahy Scale: Zero Standing balance comment: maxA+2                            Cognition Arousal/Alertness: Lethargic Behavior During Therapy: Flat affect Overall Cognitive Status: Impaired/Different from baseline Area of Impairment: Following commands;Safety/judgement;Problem solving                       Following Commands: Follows one step commands with increased time;Follows one step commands inconsistently Safety/Judgement: Decreased awareness of safety;Decreased awareness of deficits   Problem Solving: Slow processing;Decreased initiation;Difficulty sequencing;Requires verbal cues;Requires tactile cues        Exercises      General Comments        Pertinent Vitals/Pain Pain Assessment: No/denies pain    Home Living                      Prior Function            PT Goals (current goals can now be found in the care plan section) Acute Rehab PT Goals Patient Stated Goal: none stated PT Goal Formulation: With patient/family Time For Goal Achievement: 11/26/17 Potential to Achieve Goals: Fair Progress  towards PT goals: Progressing toward goals    Frequency    Min 2X/week      PT Plan Current plan remains appropriate    Co-evaluation              AM-PAC PT "6 Clicks" Daily Activity  Outcome Measure  Difficulty turning over in bed (including adjusting bedclothes, sheets and blankets)?: Unable Difficulty moving from lying on back to sitting on the side of the bed? : Unable Difficulty sitting down on and  standing up from a chair with arms (e.g., wheelchair, bedside commode, etc,.)?: Unable Help needed moving to and from a bed to chair (including a wheelchair)?: Total Help needed walking in hospital room?: Total Help needed climbing 3-5 steps with a railing? : Total 6 Click Score: 6    End of Session Equipment Utilized During Treatment: Oxygen Activity Tolerance: Patient limited by fatigue;Patient limited by lethargy Patient left: in bed;with call bell/phone within reach;with bed alarm set;with nursing/sitter in room Nurse Communication: Mobility status PT Visit Diagnosis: Unsteadiness on feet (R26.81);Other abnormalities of gait and mobility (R26.89);Muscle weakness (generalized) (M62.81)     Time: 2119-4174 PT Time Calculation (min) (ACUTE ONLY): 21 min  Charges:  $Therapeutic Activity: 8-22 mins                     Lanney Gins, PT, DPT Supplemental Physical Therapist 11/15/17 4:19 PM Pager: [phone] Office: [phone]

## 2017-11-15 NOTE — Progress Notes (Signed)
Daily Progress Note   Patient Name: Lauren Crosby       Date: 11/15/2017 DOB: [birth year]  Age: 84 y.o. MRN#: [id number] Attending Physician: Albertine Patricia, MD Primary Care Physician: [name], MD Admit Date: 10/30/2017  Reason for Consultation/Follow-up: Psychosocial/spiritual support  Subjective: Patient is resting in bed with staff present at bedside. No family at bedside. She no longer requires dialysis and states she is feeling better. She endorses a good appetite. She denies complaint currently. She continues to require max assist, and is weak. She is happy to be going to SNF for rehab. Per EMR, possible SNF placement tomorrow.   Length of Stay: 15  Current Medications: Scheduled Meds:  . Chlorhexidine Gluconate Cloth  6 each Topical Daily  . feeding supplement (PRO-STAT SUGAR FREE 64)  30 mL Oral BID BM  . ferrous sulfate  325 mg Oral BID WC  . furosemide  40 mg Oral Daily  . heparin injection (subcutaneous)  5,000 Units Subcutaneous Q8H  . insulin aspart  0-9 Units Subcutaneous Q4H  . mouth rinse  15 mL Mouth Rinse BID  . midodrine  5 mg Oral TID WC  . nystatin  5 mL Oral QID  . ranitidine  150 mg Oral BID  . sodium chloride flush  10-40 mL Intracatheter Q12H    Continuous Infusions: . sodium chloride 20 mL/hr at 11/12/17 0245    PRN Meds: acetaminophen, ipratropium-albuterol  Physical Exam  Constitutional: No distress.  Pulmonary/Chest: Effort normal.  Neurological: She is alert.  Skin: Skin is warm and dry.            Vital Signs: BP (!) 137/51   Pulse 84   Temp 98 F (36.7 C)   Resp (!) 23   Ht 5\' 2"  (1.575 m)   Wt 108.3 kg   SpO2 90%   BMI 43.67 kg/m  SpO2: SpO2: 90 % O2 Device: O2 Device: Nasal Cannula O2 Flow Rate: O2 Flow Rate  (L/min): 2.5 L/min  Intake/output summary:   Intake/Output Summary (Last 24 hours) at 11/15/2017 1138 Last data filed at 11/15/2017 0512 Gross per 24 hour  Intake 600 ml  Output 1000 ml  Net -400 ml   LBM: Last BM Date: 11/15/17 Baseline Weight: Weight: 115.7 kg Most recent weight: Weight: 108.3 kg  Palliative Assessment/Data: 40%      Patient Active Problem List   Diagnosis Date Noted  . Acute respiratory failure with hypoxemia ([place])   . Acute pulmonary edema ([facility])   . Acute encephalopathy   . Palliative care by specialist   . Goals of care, counseling/discussion   . Hyperglycemia due to type 2 diabetes mellitus ([place]) 11/04/2017  . Encephalopathy acute 11/04/2017  . Shock circulatory ([place]) 11/03/2017  . Colitis, acute 10/31/2017  . ATN (acute tubular necrosis) ([place]) 10/31/2017  . Diarrhea 10/30/2017  . CKD (chronic kidney disease) stage 3, GFR 30-59 ml/min (H[facility]) 10/30/2017  . Pyelitis: Mild per CT 05/24/2017 05/25/2017  . Acidemia   . Abdominal pain 05/24/2017  . Vaginal candidiasis 05/24/2017  . Dermatitis associated with moisture 05/24/2017  . Ileus [facility]): Colonic 05/24/2017  . Acute lower UTI 05/23/2017  . Iron deficiency anemia 05/23/2017  . Pressure injury of buttock, stage 2 ([place]) 05/23/2017  . Acute on chronic anemia   . Hyperkalemia   . Hydronephrosis   . Acute kidney failure ([place]) 05/22/2017  . Metabolic acidosis, normal anion gap (NAG) 05/22/2017  . Hyperkalemia, diminished renal excretion 05/22/2017  . Rhinovirus infection   . AKI (acute kidney injury) ([place])   . HCAP (healthcare-associated pneumonia) 01/12/2017  . ARF (acute renal failure) ([place]) 01/12/2017  . Shortness of breath   . Retinopathy due to secondary diabetes mellitus ([place])   . Pneumonia   . Obesity   . 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 Nephrolithiasis   . Hypertension   . Hyperlipemia   . Hepatitis   . Gout   . Goiter   . GERD (gastroesophageal reflux  disease)   . Diverticulitis   . Diabetes ([place])   . Colon polyp   . Cellulitis and abscess of trunk   . Arthritis   . Chronic anemia   . Adrenal adenoma   . Morbid obesity ([place]) 08/13/2013  . Postoperative anemia due to acute blood loss 08/13/2013  . S/P right TKA 08/11/2013  . Preop cardiovascular exam 08/14/2012  . Chest pain 08/14/2012  . Suture granuloma 02/09/2012  . DYSPNEA ON EXERTION 10/04/2009  . HYPERLIPIDEMIA 04/15/2007  . OBESITY 04/15/2007  . ANEMIA, CHRONIC 04/15/2007  . ANXIETY 04/15/2007  . DEPRESSION 04/15/2007  . HTN (hypertension) 04/15/2007  . GERD 04/15/2007  . DEGENERATIVE JOINT DISEASE 04/15/2007  . HELICOBACTER PYLORI INFECTION, HX OF 04/15/2007  . COLONIC POLYPS 10/04/2006  . DIVERTICULOSIS, COLON 10/04/2006  . REFLUX ESOPHAGITIS 05-31-2001  . GASTRITIS, ACUTE 05-31-2001    Palliative Care Assessment & Plan    Recommendations/Plan:  Plans for D/C to SNF for rehab. [name] states she is amenable to palliative outpatient.    Code Status:    Code Status Orders  (From admission, onward)         Start     Ordered   11/08/17 0942  Do not attempt resuscitation (DNR)  Continuous     11/08/17 0942        Code Status History    Date Active Date Inactive Code Status Order ID Comments User Context   11/06/2017 0956 11/08/2017 0942 Partial Code [id number]  Rush Farmer, MD Inpatient   10/30/2017 1415 11/06/2017 0956 Full Code [id number]  Caren Griffins, MD ED   05/22/2017 2250 06/01/2017 1900 Full Code [id number]  Etta Quill, DO ED   01/13/2017 0250 01/15/2017 1622 Full Code [id number]  Jani Gravel, MD ED   08/11/2013 1955 08/13/2013  1559 Full Code [id number]  Guinevere Scarlet Lucille Passy, PA-C Inpatient       Prognosis:  Poor long term.   Discharge Planning:  [place] for rehab with Palliative care service follow-up    Thank you for allowing the Palliative Medicine Team to assist in the care of this patient.   Total  Time 15 min Prolonged Time Billed  no      Greater than 50%  of this time was spent counseling and coordinating care related to the above assessment and plan.  Asencion Gowda, NP  Please contact Palliative Medicine Team phone at [phone] for questions and concerns.

## 2017-11-15 NOTE — Progress Notes (Addendum)
Patient's spouse has selected Health and safety inspector (private room). They will begin insurance authorization in anticipation of discharging tomorrow. PT is on the schedule to see the patient today.   Percell Locus Lenord Fralix LCSW [phone]

## 2017-11-15 NOTE — Progress Notes (Signed)
Inpatient Diabetes Program Recommendations  AACE/ADA: New Consensus Statement on Inpatient Glycemic Control (2015)  Target Ranges:  Prepandial:   less than 140 mg/dL      Peak postprandial:   less than 180 mg/dL (1-2 hours)      Critically ill patients:  140 - 180 mg/dL   Lab Results  Component Value Date   GLUCAP 167 (H) 11/15/2017   HGBA1C 6.7 (H) 05/24/2017    Review of Glycemic Control Results for BRITAIN, ANAGNOS (MRN [id number]) as of 11/15/2017 11:58  Ref. Range 11/14/2017 20:15 11/15/2017 00:22 11/15/2017 04:36 11/15/2017 08:18  Glucose-Capillary Latest Ref Range: 70 - 99 mg/dL 275 (H) 211 (H) 194 (H) 167 (H)   Home DM Meds:70/30 Insulin- 45 units AM/ 50 units PM  Current Orders:Novolog Sensitive Correction Scale/ SSI (0-9 units)Q4 hours Poor PO intake noted per MD notes--Getting Dysphagic diet.  CBGs >200 mg/dl.   MD- If within goals of care for patient, please considerthe following:  Start low dose basal insulin- Recommend Lantus 10 units QHS (0.1 units/kg dosing based on weight of 108 kg--Takes 70/30 Insulin at home but 70/30 Insulin not ideal for patients with poor PO intake, may not be safest option for DC to SNF).  Thanks, Bronson Curb, MSN, RNC-OB Diabetes Coordinator [phone] (8a-5p)

## 2017-11-15 NOTE — Progress Notes (Signed)
PROGRESS NOTE                                                                                                                                                                                                             Patient Demographics:    Lauren Crosby, is a 84 y.o. female, DOB - 08/24/1931, JJH:[id number]  Admit date - 10/30/2017   Admitting Physician Costin Karlyne Greenspan, MD  Outpatient Primary MD for the patient is [name], MD  LOS - 15  Chief Complaint  Patient presents with  . Diarrhea       Brief Narrative  - 84 year old with past medical history relevant for type 2 diabetes on insulin, 3 CKD, hypertension, hyperlipidemia, chronic bilateral lower extremity weakness, class III obesity admitted on 10/30/2017 with C. difficile colitis and diarrhea and diffuse weakness with a prolonged hospital course complicated by septic shock and AKI requiring initiation of CRRT and ICU stay and encephalopathy.   Subjective:   Patient in bed, appears comfortable, denies any headache, no fever, no chest pain or pressure, no shortness of breath , no abdominal pain. No focal weakness.    Assessment  & Plan :     Septic shock,  -This with hypotension, acute renal failure, secondary to infectious process and volume depletion, from C. difficile colitis  -Required brief stay at ICU as long as she required pressors . -Physiology of sepsis has resolved, she has finished her antibiotic course   ARF due to #1 -Secondary to above, she did require CRRT in ICU, she has been off CRRT since 11/10/2017, improving urine output, 1.2 L in the last 24 hours, she was resumed on the basis, Lasix dose by renal. -Renal function recovering, is 2.0 today, discussed with nephrology, no further requirement for dialysis, her temporary hemodialysis catheter discontinued yesterday, will DC Foley catheter today as discussed with renal.  Metabolic and toxic encephalopathy.   -Due to above,Improving.   Supportive care, remains at risk for delirium, minimize narcotics and benzodiazepines.  Palliative care on board.  Currently DNR but husband wants to pursue present line of care.  History of gout.  On allopurinol.  Essential hypertension.  Blood pressure currently low, Lopressor has been discontinued currently on midodrine.  Dyslipidemia.  Continue home dose statin.  Dysphagia.  Speech therapy on board currently on dysphagia 1 diet.    Diffuse weakness, deconditioning.  Bedbound status was 20 at home with minimal ambulation with walker at times.  PT initiated.    Iron  deficiency microcytic anemia along with acute anemia of acute sickness.  No signs of active bleeding, most of the anemia is due to multiple blood draws on top of microcytic iron deficiency anemia, transfused 2 units of packed RBC on 11/11/2017, anemia panel noted placed on PO + IV Iron dosed by pharmacy.  Placed on Zantac instead of PPI due to recent C. difficile.    DM type II.  Currently on sliding scale, oral intake is poor we will continue to monitor.  Lab Results  Component Value Date   HGBA1C 6.7 (H) 05/24/2017   CBG (last 3)  Recent Labs    11/15/17 0436 11/15/17 0818 11/15/17 1331  GLUCAP 194* 167* 190*     Family Communication  :  Husband Bedside  Code Status :  DNR  Disposition Plan  :  SNF  Consults  :  Renal, PCCM, Pall Care  Procedures  :     [facility] catheter placement on 11/03/2017, continued 11/14/2017  CRRT initiated on 11/03/2017  2 Units PRBCs 11/11/17  DVT Prophylaxis  :  Heparin   Lab Results  Component Value Date   PLT 126 (L) 11/15/2017    Diet :  Diet Order            DIET DYS 2 Room service appropriate? Yes; Fluid consistency: Thin  Diet effective now               Inpatient Medications Scheduled Meds: . Chlorhexidine Gluconate Cloth  6 each Topical Daily  . feeding supplement (PRO-STAT SUGAR FREE 64)  30 mL Oral BID BM  . ferrous sulfate  325 mg Oral BID WC  .  furosemide  40 mg Oral Daily  . heparin injection (subcutaneous)  5,000 Units Subcutaneous Q8H  . insulin aspart  0-9 Units Subcutaneous Q4H  . mouth rinse  15 mL Mouth Rinse BID  . midodrine  5 mg Oral TID WC  . nystatin  5 mL Oral QID  . ranitidine  75 mg Oral BID  . sodium chloride flush  10-40 mL Intracatheter Q12H   Continuous Infusions: . sodium chloride 20 mL/hr at 11/12/17 0245   PRN Meds:.acetaminophen, ipratropium-albuterol  Antibiotics  :   Anti-infectives (From admission, onward)   Start     Dose/Rate Route Frequency Ordered Stop   11/02/17 1200  cefTRIAXone (ROCEPHIN) 2 g in sodium chloride 0.9 % 100 mL IVPB     2 g 200 mL/hr over 30 Minutes Intravenous Every 24 hours 11/02/17 0856 11/05/17 1245   10/31/17 1200  cefTRIAXone (ROCEPHIN) 1 g in sodium chloride 0.9 % 100 mL IVPB  Status:  Discontinued     1 g 200 mL/hr over 30 Minutes Intravenous Every 24 hours 10/31/17 1005 10/31/17 1056   10/31/17 1200  cefTRIAXone (ROCEPHIN) 2 g in sodium chloride 0.9 % 100 mL IVPB  Status:  Discontinued     2 g 200 mL/hr over 30 Minutes Intravenous Every 24 hours 10/31/17 1056 11/02/17 0856   10/31/17 1000  vancomycin (VANCOCIN) 50 mg/mL oral solution 125 mg  Status:  Discontinued     125 mg Oral 4 times daily 10/31/17 0951 10/31/17 0952   10/30/17 1800  vancomycin (VANCOCIN) 50 mg/mL oral solution 125 mg  Status:  Discontinued     125 mg Oral 4 times daily 10/30/17 1731 10/31/17 0959   10/30/17 1430  ciprofloxacin (CIPRO) IVPB 400 mg  Status:  Discontinued     400 mg 200 mL/hr over 60 Minutes Intravenous Every  24 hours 10/30/17 1415 10/31/17 1048   10/30/17 1430  metroNIDAZOLE (FLAGYL) IVPB 500 mg     500 mg 100 mL/hr over 60 Minutes Intravenous Every 8 hours 10/30/17 1415 11/05/17 2334          Objective:   Vitals:   11/14/17 1930 11/14/17 2016 11/14/17 2331 11/15/17 0331  BP: (!) 147/53  (!) 141/57 (!) 137/51  Pulse: 80   84  Resp: (!) 32  (!) 33 (!) 23  Temp:  98 F  (36.7 C)    TempSrc:      SpO2: 97%   90%  Weight:      Height:        Wt Readings from Last 3 Encounters:  11/10/17 108.3 kg  06/01/17 106.9 kg  03/01/17 112.5 kg     Intake/Output Summary (Last 24 hours) at 11/15/2017 1619 Last data filed at 11/15/2017 1332 Gross per 24 hour  Intake -  Output 1700 ml  Net -1700 ml     Physical Exam  Awake Alert, more  conversant and appropriate today Symmetrical Chest wall movement, Good air movement bilaterally, CTAB RRR,No Gallops,Rubs or new Murmurs, No Parasternal Heave +ve B.Sounds, Abd Soft, No tenderness, No rebound - guarding or rigidity. No Cyanosis, Clubbing or edema, No new Rash or bruise         Data Review:    CBC Recent Labs  Lab 11/11/17 0346 11/11/17 2056 11/12/17 0344 11/13/17 0320 11/14/17 1051 11/15/17 0426  WBC 6.4  --  6.2 7.4 4.5 4.4  HGB 6.6* 8.6* 8.2* 8.3* 8.3* 8.0*  HCT 22.7* 28.3* 27.7* 28.7* 29.0* 27.5*  PLT 128*  --  143* 136* 127* 126*  MCV 74.2*  --  77.2* 77.4* 76.3* 76.2*  MCH 21.6*  --  22.8* 22.4* 21.8* 22.2*  MCHC 29.1*  --  29.6* 28.9* 28.6* 29.1*  RDW 23.6*  --  22.9* 23.7* 23.9* 23.7*    Chemistries  Recent Labs  Lab 11/10/17 0540  11/11/17 0346 11/11/17 2056 11/12/17 0344 11/13/17 0320 11/13/17 0340 11/14/17 1051 11/15/17 0426  NA 139  136   < > 138 137 136  --  136 138 138  K 3.9  3.9   < > 3.9 4.0 3.5  --  4.8 4.6 4.9  CL 105  102   < > 108 106 107  --  104 106 106  CO2 26  25   < > 25 26 26   --  24 23 26   GLUCOSE 195*  188*   < > 167* 265* 212*  --  197* 211* 199*  BUN 16  16   < > 19 26* 30*  --  44* 53* 56*  CREATININE 1.41*  1.36*   < > 2.45* 2.64* 2.69*  --  2.63* 2.16* 2.00*  CALCIUM 7.9*  7.7*   < > 8.3* 8.3* 8.2*  --  8.3* 9.0 8.9  MG 2.4  --  2.5*  --  2.6* 2.4  --  2.2 2.1  AST 35  --   --   --   --   --   --   --   --   ALT 22  --   --   --   --   --   --   --   --   ALKPHOS 101  --   --   --   --   --   --   --   --   BILITOT  0.7  --   --   --    --   --   --   --   --    < > = values in this interval not displayed.   ------------------------------------------------------------------------------------------------------------------ No results for input(s): CHOL, HDL, LDLCALC, TRIG, CHOLHDL, LDLDIRECT in the last 72 hours.  Lab Results  Component Value Date   HGBA1C 6.7 (H) 05/24/2017   ------------------------------------------------------------------------------------------------------------------ No results for input(s): TSH, T4TOTAL, T3FREE, THYROIDAB in the last 72 hours.  Invalid input(s): FREET3 ------------------------------------------------------------------------------------------------------------------ No results for input(s): VITAMINB12, FOLATE, FERRITIN, TIBC, IRON, RETICCTPCT in the last 72 hours.  Coagulation profile No results for input(s): INR, PROTIME in the last 168 hours.  No results for input(s): DDIMER in the last 72 hours.  Cardiac Enzymes No results for input(s): CKMB, TROPONINI, MYOGLOBIN in the last 168 hours.  Invalid input(s): CK ------------------------------------------------------------------------------------------------------------------    Component Value Date/Time   BNP 810.9 (H) 11/13/2017 0320   BNP 18.5 08/14/2012 0930    Micro Results No results found for this or any previous visit (from the past 240 hour(s)).  Radiology Reports Ct Abdomen Pelvis Wo Contrast  Result Date: 10/30/2017 CLINICAL DATA:  Abdominal distension, diarrhea for 5 days, blood in diarrhea, unable to control bladder or bowel, history of adrenal adenoma, diabetes mellitus, hypertension, nephrolithiasis, renal failure EXAM: CT ABDOMEN AND PELVIS WITHOUT CONTRAST TECHNIQUE: Multidetector CT imaging of the abdomen and pelvis was performed following the standard protocol without IV contrast. Sagittal and coronal MPR images reconstructed from axial data set. Patient drank dilute oral contrast for exam COMPARISON:   05/24/2017 FINDINGS: Lower chest: Bibasilar atelectasis and peribronchial thickening Hepatobiliary: Gallbladder surgically absent. No focal hepatic abnormalities Pancreas: Pancreatic atrophy without mass Spleen: Normal appearance Adrenals/Urinary Tract: Low-attenuation LEFT adrenal mass 3.1 x 2.6 cm consistent with adenoma. RIGHT adrenal gland unremarkable. BILATERAL renal cortical atrophy. Exophytic intermediate attenuation mass lateral aspect of inferior RIGHT kidney, 3.7 x 3.6 cm image 56. Minimal prominence of LEFT renal pelvis and LEFT ureter in the pelvis without obstructing calculus. RIGHT ureter and bladder normal appearance. Stomach/Bowel: Appendix surgically absent by history. Scattered colonic diverticulosis. Wall thickening of sigmoid colon with mild pericolic infiltrative changes. Additional wall thickening at the rectosigmoid colon. Findings could represent acute diverticulitis or colitis. No abscess or extraluminal gas. Stomach and small bowel loops unremarkable. Vascular/Lymphatic: Atherosclerotic calcifications aorta and iliac arteries. Coronary arterial calcifications. Aorta normal caliber. No adenopathy. Reproductive: Uterus surgically absent.  Normal appearing ovaries. Other: No free air or free fluid. Tiny LEFT parasagittal ventral hernia inferior to the umbilicus. Musculoskeletal: Demineralized with degenerative disc and facet disease changes lumbar spine. IMPRESSION: Diffuse colonic diverticulosis. Wall thickening of the rectosigmoid colon with mild pericolic infiltrative changes, question diverticulitis versus colitis. No evidence of perforation or abscess. LEFT adrenal adenoma 3.1 x 2.6 cm. Grossly unchanged appearance of an exophytic mass 3.7 x 3.6 cm at the lateral RIGHT kidney, question complicated cyst; lesion is unchanged since 05/24/2017 but minimally larger than seen on 02/10/2015 when it measured 3.5 x 3.2 cm, recommended attention on follow-up imaging to establish long-term  stability. Electronically Signed   By: Lavonia Dana M.D.   On: 10/30/2017 13:03   Dg Abd 1 View  Result Date: 11/09/2017 CLINICAL DATA:  Patient pulled out nasogastric catheter EXAM: ABDOMEN - 1 VIEW COMPARISON:  11/07/2017 FINDINGS: Scattered large and small bowel gas is noted. The previously seen nasogastric catheter has been removed in the interval. Despite the given clinical history of metal tipped catheter this catheter  on previous images represented a standard nasogastric catheter without metallic weighting. No foreign body is noted. IMPRESSION: No evidence of retained foreign body as the prior nasogastric catheter was not a weighted tipped catheter based on previous images. Electronically Signed   By: Inez Catalina M.D.   On: 11/09/2017 19:57   Dg Abd 1 View  Result Date: 11/07/2017 CLINICAL DATA:  84 year old female with feeding tube placement. EXAM: ABDOMEN - 1 VIEW COMPARISON:  Earlier abdominal radiograph dated 11/07/2017 FINDINGS: No significant change in the appearance or positioning of the enteric tube which appears somewhat folded, likely in the mid to distal stomach. IMPRESSION: No significant interval change. Electronically Signed   By: Anner Crete M.D.   On: 11/07/2017 23:00   Dg Abd 1 View  Result Date: 11/03/2017 CLINICAL DATA:  Abdominal distension. EXAM: ABDOMEN - 1 VIEW COMPARISON:  CT, 10/30/2017 FINDINGS: Normal bowel gas pattern with no evidence of obstruction. Soft tissues are not well-defined due to body habitus. No convincing renal or ureteral stones. Status post cholecystectomy. No acute skeletal abnormality. IMPRESSION: 1. No acute findings.  No evidence of bowel obstruction. Electronically Signed   By: Lajean Manes M.D.   On: 11/03/2017 11:17   Ir Fluoro Guide Cv Line Right  Result Date: 11/03/2017 INDICATION: End-stage renal disease. In need intravenous access for the initiation of hemodialysis. EXAM: NON-TUNNELED CENTRAL VENOUS HEMODIALYSIS CATHETER PLACEMENT  WITH ULTRASOUND AND FLUOROSCOPIC GUIDANCE COMPARISON:  Chest radiograph - 10/31/2017 MEDICATIONS: None FLUOROSCOPY TIME:  24 seconds (14 mGy) COMPLICATIONS: None immediate. PROCEDURE: Informed written consent was obtained from patient's family after a discussion of the risks, benefits, and alternatives to treatment. Questions regarding the procedure were encouraged and answered. The right neck and chest were prepped with chlorhexidine in a sterile fashion, and a sterile drape was applied covering the operative field. Maximum barrier sterile technique with sterile gowns and gloves were used for the procedure. A timeout was performed prior to the initiation of the procedure. After the overlying soft tissues were anesthetized, a small venotomy incision was created and a micropuncture kit was utilized to access the internal jugular vein. Real-time ultrasound guidance was utilized for vascular access including the acquisition of a permanent ultrasound image documenting patency of the accessed vessel. The microwire was utilized to measure appropriate catheter length. A stiff glidewire was advanced to the level of the IVC. Under fluoroscopic guidance, the venotomy was serially dilated, ultimately allowing placement of a 20 cm temporary Trialysis catheter with tip ultimately terminating within the superior aspect of the right atrium. Final catheter positioning was confirmed and documented with a spot radiographic image. The catheter aspirates and flushes normally. The catheter was flushed with appropriate volume heparin dwells. The catheter exit site was secured with a 0-Prolene retention suture. A dressing was placed. The patient tolerated the procedure well without immediate post procedural complication. IMPRESSION: Successful placement of a right internal jugular approach 20 cm temporary dialysis catheter with tip terminating with in the superior aspect of the right atrium. The catheter is ready for immediate use. PLAN:  This catheter may be converted to a tunneled dialysis catheter at a later date as indicated. Electronically Signed   By: Sandi Mariscal M.D.   On: 11/03/2017 18:01   Ir US Guide Vasc Access Right  Result Date: 11/03/2017 INDICATION: End-stage renal disease. In need intravenous access for the initiation of hemodialysis. EXAM: NON-TUNNELED CENTRAL VENOUS HEMODIALYSIS CATHETER PLACEMENT WITH ULTRASOUND AND FLUOROSCOPIC GUIDANCE COMPARISON:  Chest radiograph - 10/31/2017 MEDICATIONS: None FLUOROSCOPY  TIME:  24 seconds (14 mGy) COMPLICATIONS: None immediate. PROCEDURE: Informed written consent was obtained from patient's family after a discussion of the risks, benefits, and alternatives to treatment. Questions regarding the procedure were encouraged and answered. The right neck and chest were prepped with chlorhexidine in a sterile fashion, and a sterile drape was applied covering the operative field. Maximum barrier sterile technique with sterile gowns and gloves were used for the procedure. A timeout was performed prior to the initiation of the procedure. After the overlying soft tissues were anesthetized, a small venotomy incision was created and a micropuncture kit was utilized to access the internal jugular vein. Real-time ultrasound guidance was utilized for vascular access including the acquisition of a permanent ultrasound image documenting patency of the accessed vessel. The microwire was utilized to measure appropriate catheter length. A stiff glidewire was advanced to the level of the IVC. Under fluoroscopic guidance, the venotomy was serially dilated, ultimately allowing placement of a 20 cm temporary Trialysis catheter with tip ultimately terminating within the superior aspect of the right atrium. Final catheter positioning was confirmed and documented with a spot radiographic image. The catheter aspirates and flushes normally. The catheter was flushed with appropriate volume heparin dwells. The catheter  exit site was secured with a 0-Prolene retention suture. A dressing was placed. The patient tolerated the procedure well without immediate post procedural complication. IMPRESSION: Successful placement of a right internal jugular approach 20 cm temporary dialysis catheter with tip terminating with in the superior aspect of the right atrium. The catheter is ready for immediate use. PLAN: This catheter may be converted to a tunneled dialysis catheter at a later date as indicated. Electronically Signed   By: Sandi Mariscal M.D.   On: 11/03/2017 18:01   Dg Chest Port 1 View  Result Date: 11/13/2017 CLINICAL DATA:  Shortness of breath EXAM: PORTABLE CHEST 1 VIEW COMPARISON:  11/09/2017 FINDINGS: Cardiomegaly and slightly increased pulmonary vascular congestion noted. Mild bibasilar opacities/atelectasis noted in this low volume film. A RIGHT central venous catheter with tips overlying the LOWER SVC/SUPERIOR cavoatrial junction noted. No pneumothorax. IMPRESSION: Cardiomegaly with pulmonary vascular congestion and mild bibasilar opacities/atelectasis. Electronically Signed   By: Margarette Canada M.D.   On: 11/13/2017 07:49   Dg Chest Port 1 View  Result Date: 11/09/2017 CLINICAL DATA:  Patient removed recent nasogastric catheter EXAM: PORTABLE CHEST 1 VIEW COMPARISON:  11/08/2017 FINDINGS: Cardiac shadow is enlarged. Right jugular central line is again seen and stable. Inspiratory effort is poor with crowding of the vascular markings. No focal confluent infiltrate is seen. No radiopaque foreign body is noted. The previously seen nasogastric catheter has been removed and as previously described was not a weighted tipped catheter. IMPRESSION: Poor inspiratory effort.  No acute abnormality noted. Electronically Signed   By: Inez Catalina M.D.   On: 11/09/2017 19:58   Dg Chest Port 1 View  Result Date: 11/08/2017 CLINICAL DATA:  Respiratory failure. EXAM: PORTABLE CHEST 1 VIEW COMPARISON:  11/07/2017 and older exams.  FINDINGS: Mild enlargement of the cardiopericardial silhouette, stable. Prominent bronchovascular markings, with additional lung base opacity, the latter consistent with atelectasis, also without change from the previous day's exam. New nasal/orogastric tube passes below the included field of view, likely into the stomach. Right subclavian dual lumen central venous catheter is stable, tip in the right atrium. No pneumothorax. IMPRESSION: 1. No significant change in lung aeration from the previous day's study. There are prominent bronchovascular markings and lung base atelectasis, but no convincing pulmonary  edema or pneumonia. 2. New nasal/orogastric tube. Tip not visualized, but likely extends into the stomach. Electronically Signed   By: Lajean Manes M.D.   On: 11/08/2017 07:13   Dg Chest Port 1 View  Result Date: 11/07/2017 CLINICAL DATA:  Acute respiratory failure with hypoxia. EXAM: PORTABLE CHEST 1 VIEW COMPARISON:  10/31/2017. FINDINGS: Cardiomegaly. Improved lung volumes. Increased perihilar markings could represent early edema or viral pneumonitis. Other than improved lung markings, similar appearance to priors. Dialysis catheter has been inserted via RIGHT IJ approach. Tip slide in the RIGHT atrium. There is no pneumothorax. IMPRESSION: Satisfactory catheter placement. No significant change in the appearance of the lungs except for improved volume. Cardiomegaly. Electronically Signed   By: Staci Righter M.D.   On: 11/07/2017 11:30   Dg Chest Port 1 View  Result Date: 10/31/2017 CLINICAL DATA:  84 year old female with shortness of breath. EXAM: PORTABLE CHEST 1 VIEW COMPARISON:  Portable chest 10/30/2017 and earlier. CT Abdomen and Pelvis 10/30/2017. FINDINGS: There did not appear to be overt pulmonary edema at the lung bases yesterday by CT. Lower lobe bronchiectasis and peribronchial thickening was noted. Continued low lung volumes stable appearance of the pulmonary interstitium today from May  this year. Stable mild cardiomegaly. Visualized tracheal air column is within normal limits. No pneumothorax, pleural effusion or confluent opacity. Large body habitus. No acute osseous abnormality identified. IMPRESSION: Persistent low lung volumes. No focal pulmonary abnormality. Consider viral or atypical respiratory infection given the appearance of the lung bases on CT Abdomen and Pelvis yesterday. Electronically Signed   By: Genevie Ann M.D.   On: 10/31/2017 13:18   Dg Chest Port 1 View  Result Date: 10/30/2017 CLINICAL DATA:  84 year old female with constipation and watery diarrhea. Initial encounter. EXAM: PORTABLE CHEST 1 VIEW COMPARISON:  05/27/2017. FINDINGS: Cardiomegaly. Asymmetric mild pulmonary edema superimposed upon chronic changes. Calcified aorta. IMPRESSION: 1. Asymmetric mild pulmonary edema. 2. Cardiomegaly. 3.  Aortic Atherosclerosis (ICD10-I70.0). Electronically Signed   By: Genia Del M.D.   On: 10/30/2017 15:06   Dg Abd Portable 1v  Result Date: 11/07/2017 CLINICAL DATA:  Nasogastric tube placement. EXAM: PORTABLE ABDOMEN - 1 VIEW COMPARISON:  11/03/2017 FINDINGS: Interval nasogastric tube folded back upon itself in the mid to distal stomach with its tip in the mid stomach. The included bowel gas pattern is normal. Prominent interstitial markings at the left lung base. Lower thoracic spine degenerative changes. IMPRESSION: Nasogastric tube folded back upon itself in the mid to distal stomach with its tip in the mid stomach. Electronically Signed   By: Claudie Revering M.D.   On: 11/07/2017 13:13      Phillips Climes M.D on 11/15/2017 at 4:19 PM  To page go to [email or web address] - password [email or web address]

## 2017-11-16 DIAGNOSIS — E11319 Type 2 diabetes mellitus with unspecified diabetic retinopathy without macular edema: Secondary | ICD-10-CM | POA: Diagnosis present

## 2017-11-16 DIAGNOSIS — Z515 Encounter for palliative care: Secondary | ICD-10-CM | POA: Diagnosis not present

## 2017-11-16 DIAGNOSIS — I5033 Acute on chronic diastolic (congestive) heart failure: Secondary | ICD-10-CM | POA: Diagnosis not present

## 2017-11-16 DIAGNOSIS — R404 Transient alteration of awareness: Secondary | ICD-10-CM | POA: Diagnosis not present

## 2017-11-16 DIAGNOSIS — N183 Chronic kidney disease, stage 3 (moderate): Secondary | ICD-10-CM | POA: Diagnosis not present

## 2017-11-16 DIAGNOSIS — E1122 Type 2 diabetes mellitus with diabetic chronic kidney disease: Secondary | ICD-10-CM | POA: Diagnosis not present

## 2017-11-16 DIAGNOSIS — J9602 Acute respiratory failure with hypercapnia: Secondary | ICD-10-CM | POA: Diagnosis not present

## 2017-11-16 DIAGNOSIS — N184 Chronic kidney disease, stage 4 (severe): Secondary | ICD-10-CM | POA: Diagnosis not present

## 2017-11-16 DIAGNOSIS — E785 Hyperlipidemia, unspecified: Secondary | ICD-10-CM | POA: Diagnosis present

## 2017-11-16 DIAGNOSIS — E669 Obesity, unspecified: Secondary | ICD-10-CM | POA: Diagnosis present

## 2017-11-16 DIAGNOSIS — R600 Localized edema: Secondary | ICD-10-CM | POA: Diagnosis not present

## 2017-11-16 DIAGNOSIS — G9340 Encephalopathy, unspecified: Secondary | ICD-10-CM | POA: Diagnosis not present

## 2017-11-16 DIAGNOSIS — Z6841 Body Mass Index (BMI) 40.0 and over, adult: Secondary | ICD-10-CM | POA: Diagnosis not present

## 2017-11-16 DIAGNOSIS — D563 Thalassemia minor: Secondary | ICD-10-CM | POA: Diagnosis present

## 2017-11-16 DIAGNOSIS — E1165 Type 2 diabetes mellitus with hyperglycemia: Secondary | ICD-10-CM | POA: Diagnosis not present

## 2017-11-16 DIAGNOSIS — M2550 Pain in unspecified joint: Secondary | ICD-10-CM | POA: Diagnosis not present

## 2017-11-16 DIAGNOSIS — I5032 Chronic diastolic (congestive) heart failure: Secondary | ICD-10-CM | POA: Diagnosis not present

## 2017-11-16 DIAGNOSIS — A419 Sepsis, unspecified organism: Secondary | ICD-10-CM | POA: Diagnosis not present

## 2017-11-16 DIAGNOSIS — Z7401 Bed confinement status: Secondary | ICD-10-CM | POA: Diagnosis not present

## 2017-11-16 DIAGNOSIS — I130 Hypertensive heart and chronic kidney disease with heart failure and stage 1 through stage 4 chronic kidney disease, or unspecified chronic kidney disease: Secondary | ICD-10-CM | POA: Diagnosis not present

## 2017-11-16 DIAGNOSIS — I5030 Unspecified diastolic (congestive) heart failure: Secondary | ICD-10-CM | POA: Diagnosis not present

## 2017-11-16 DIAGNOSIS — T502X5A Adverse effect of carbonic-anhydrase inhibitors, benzothiadiazides and other diuretics, initial encounter: Secondary | ICD-10-CM | POA: Diagnosis present

## 2017-11-16 DIAGNOSIS — E876 Hypokalemia: Secondary | ICD-10-CM | POA: Diagnosis not present

## 2017-11-16 DIAGNOSIS — N170 Acute kidney failure with tubular necrosis: Secondary | ICD-10-CM | POA: Diagnosis not present

## 2017-11-16 DIAGNOSIS — L89159 Pressure ulcer of sacral region, unspecified stage: Secondary | ICD-10-CM | POA: Diagnosis present

## 2017-11-16 DIAGNOSIS — R0603 Acute respiratory distress: Secondary | ICD-10-CM | POA: Diagnosis not present

## 2017-11-16 DIAGNOSIS — E1169 Type 2 diabetes mellitus with other specified complication: Secondary | ICD-10-CM | POA: Diagnosis not present

## 2017-11-16 DIAGNOSIS — D649 Anemia, unspecified: Secondary | ICD-10-CM | POA: Diagnosis not present

## 2017-11-16 DIAGNOSIS — F329 Major depressive disorder, single episode, unspecified: Secondary | ICD-10-CM | POA: Diagnosis present

## 2017-11-16 DIAGNOSIS — I10 Essential (primary) hypertension: Secondary | ICD-10-CM | POA: Diagnosis not present

## 2017-11-16 DIAGNOSIS — I69828 Other speech and language deficits following other cerebrovascular disease: Secondary | ICD-10-CM | POA: Diagnosis not present

## 2017-11-16 DIAGNOSIS — D631 Anemia in chronic kidney disease: Secondary | ICD-10-CM | POA: Diagnosis present

## 2017-11-16 DIAGNOSIS — E870 Hyperosmolality and hypernatremia: Secondary | ICD-10-CM | POA: Diagnosis not present

## 2017-11-16 DIAGNOSIS — R6521 Severe sepsis with septic shock: Secondary | ICD-10-CM | POA: Diagnosis not present

## 2017-11-16 DIAGNOSIS — R579 Shock, unspecified: Secondary | ICD-10-CM | POA: Diagnosis not present

## 2017-11-16 DIAGNOSIS — R2689 Other abnormalities of gait and mobility: Secondary | ICD-10-CM | POA: Diagnosis not present

## 2017-11-16 DIAGNOSIS — Z7189 Other specified counseling: Secondary | ICD-10-CM | POA: Diagnosis not present

## 2017-11-16 DIAGNOSIS — I129 Hypertensive chronic kidney disease with stage 1 through stage 4 chronic kidney disease, or unspecified chronic kidney disease: Secondary | ICD-10-CM | POA: Diagnosis not present

## 2017-11-16 DIAGNOSIS — R0902 Hypoxemia: Secondary | ICD-10-CM | POA: Diagnosis not present

## 2017-11-16 DIAGNOSIS — Z794 Long term (current) use of insulin: Secondary | ICD-10-CM | POA: Diagnosis not present

## 2017-11-16 DIAGNOSIS — R569 Unspecified convulsions: Secondary | ICD-10-CM | POA: Diagnosis not present

## 2017-11-16 DIAGNOSIS — K219 Gastro-esophageal reflux disease without esophagitis: Secondary | ICD-10-CM | POA: Diagnosis not present

## 2017-11-16 DIAGNOSIS — A0472 Enterocolitis due to Clostridium difficile, not specified as recurrent: Secondary | ICD-10-CM | POA: Diagnosis not present

## 2017-11-16 DIAGNOSIS — R1312 Dysphagia, oropharyngeal phase: Secondary | ICD-10-CM | POA: Diagnosis not present

## 2017-11-16 DIAGNOSIS — N132 Hydronephrosis with renal and ureteral calculous obstruction: Secondary | ICD-10-CM | POA: Diagnosis not present

## 2017-11-16 DIAGNOSIS — R4182 Altered mental status, unspecified: Secondary | ICD-10-CM | POA: Diagnosis not present

## 2017-11-16 DIAGNOSIS — M6281 Muscle weakness (generalized): Secondary | ICD-10-CM | POA: Diagnosis not present

## 2017-11-16 DIAGNOSIS — N179 Acute kidney failure, unspecified: Secondary | ICD-10-CM | POA: Diagnosis not present

## 2017-11-16 DIAGNOSIS — D509 Iron deficiency anemia, unspecified: Secondary | ICD-10-CM | POA: Diagnosis present

## 2017-11-16 DIAGNOSIS — R410 Disorientation, unspecified: Secondary | ICD-10-CM | POA: Diagnosis not present

## 2017-11-16 DIAGNOSIS — Z66 Do not resuscitate: Secondary | ICD-10-CM | POA: Diagnosis present

## 2017-11-16 DIAGNOSIS — Z8619 Personal history of other infectious and parasitic diseases: Secondary | ICD-10-CM | POA: Diagnosis not present

## 2017-11-16 DIAGNOSIS — G9341 Metabolic encephalopathy: Secondary | ICD-10-CM | POA: Diagnosis not present

## 2017-11-16 DIAGNOSIS — I959 Hypotension, unspecified: Secondary | ICD-10-CM | POA: Diagnosis not present

## 2017-11-16 DIAGNOSIS — G4089 Other seizures: Secondary | ICD-10-CM | POA: Diagnosis not present

## 2017-11-16 LAB — RENAL FUNCTION PANEL
ALBUMIN: 2.8 g/dL — AB (ref 3.5–5.0)
Anion gap: 7 (ref 5–15)
BUN: 49 mg/dL — AB (ref 8–23)
CALCIUM: 8.7 mg/dL — AB (ref 8.9–10.3)
CO2: 28 mmol/L (ref 22–32)
CREATININE: 1.64 mg/dL — AB (ref 0.44–1.00)
Chloride: 105 mmol/L (ref 98–111)
GFR calc Af Amer: 32 mL/min — ABNORMAL LOW (ref >60)
GFR, EST NON AFRICAN AMERICAN: 28 mL/min — AB (ref >60)
Glucose, Bld: 201 mg/dL — ABNORMAL HIGH (ref 70–99)
PHOSPHORUS: 4 mg/dL (ref 2.5–4.6)
Potassium: 4.5 mmol/L (ref 3.5–5.1)
Sodium: 140 mmol/L (ref 135–145)

## 2017-11-16 LAB — GLUCOSE, CAPILLARY
GLUCOSE-CAPILLARY: 241 mg/dL — AB (ref 70–99)
Glucose-Capillary: 174 mg/dL — ABNORMAL HIGH (ref 70–99)
Glucose-Capillary: 187 mg/dL — ABNORMAL HIGH (ref 70–99)
Glucose-Capillary: 232 mg/dL — ABNORMAL HIGH (ref 70–99)
Glucose-Capillary: 252 mg/dL — ABNORMAL HIGH (ref 70–99)

## 2017-11-16 LAB — MAGNESIUM: MAGNESIUM: 1.8 mg/dL (ref 1.7–2.4)

## 2017-11-16 MED ORDER — METOPROLOL TARTRATE 25 MG PO TABS: 12.5000 mg | ORAL_TABLET | Freq: Two times a day (BID) | ORAL | 0 refills | Status: DC

## 2017-11-16 MED ORDER — INSULIN ASPART 100 UNIT/ML ~~LOC~~ SOLN: 0.0000 [IU] | SUBCUTANEOUS | 11 refills | Status: DC

## 2017-11-16 MED ORDER — PRO-STAT SUGAR FREE PO LIQD: 30.0000 mL | Freq: Two times a day (BID) | ORAL | 0 refills | Status: AC

## 2017-11-16 MED ORDER — FERROUS SULFATE 325 (65 FE) MG PO TABS: 325.0000 mg | ORAL_TABLET | Freq: Two times a day (BID) | ORAL | 3 refills | Status: DC

## 2017-11-16 MED ORDER — LORAZEPAM 1 MG PO TABS
1.0000 mg | ORAL_TABLET | Freq: Once | ORAL | Status: AC
  Administered 2017-11-16: 1 mg via ORAL
  Filled 2017-11-16: qty 1

## 2017-11-16 MED ORDER — INSULIN GLARGINE 100 UNIT/ML ~~LOC~~ SOLN: 10.0000 [IU] | Freq: Every day | SUBCUTANEOUS | 3 refills | Status: DC

## 2017-11-16 MED ORDER — IPRATROPIUM-ALBUTEROL 0.5-2.5 (3) MG/3ML IN SOLN: 3.0000 mL | RESPIRATORY_TRACT | Status: AC | PRN

## 2017-11-16 MED ORDER — FUROSEMIDE 20 MG PO TABS: 40.0000 mg | ORAL_TABLET | Freq: Every day | ORAL | 5 refills | Status: DC

## 2017-11-16 MED ORDER — RANITIDINE HCL 150 MG/10ML PO SYRP: 75.0000 mg | ORAL_SOLUTION | Freq: Two times a day (BID) | ORAL | 0 refills | Status: DC

## 2017-11-16 NOTE — Discharge Instructions (Signed)
Follow with Primary MD [name], MD SNF physician  Get CBC, CMP, checked  by Primary MD next visit.    Activity: As tolerated with Full fall precautions use walker/cane & assistance as needed   Disposition SNF   Diet: Dysphagia 2, heart healthy carb modified, with feeding assistance and full aspiration precaution  For Heart failure patients - Check your Weight same time everyday, if you gain over 2 pounds, or you develop in leg swelling, experience more shortness of breath or chest pain, call your Primary MD immediately. Follow Cardiac Low Salt Diet and 1.5 lit/day fluid restriction.   On your next visit with your primary care physician please Get Medicines reviewed and adjusted.   Please request your Prim.MD to go over all Hospital Tests and Procedure/Radiological results at the follow up, please get all Hospital records sent to your Prim MD by signing hospital release before you go home.   If you experience worsening of your admission symptoms, develop shortness of breath, life threatening emergency, suicidal or homicidal thoughts you must seek medical attention immediately by calling 911 or calling your MD immediately  if symptoms less severe.  You Must read complete instructions/literature along with all the possible adverse reactions/side effects for all the Medicines you take and that have been prescribed to you. Take any new Medicines after you have completely understood and accpet all the possible adverse reactions/side effects.   Do not drive, operating heavy machinery, perform activities at heights, swimming or participation in water activities or provide baby sitting services if your were admitted for syncope or siezures until you have seen by Primary MD or a Neurologist and advised to do so again.  Do not drive when taking Pain medications.    Do not take more than prescribed Pain, Sleep and Anxiety Medications  Special Instructions: If you have smoked or chewed  Tobacco  in the last 2 yrs please stop smoking, stop any regular Alcohol  and or any Recreational drug use.  Wear Seat belts while driving.   Please note  You were cared for by a hospitalist during your hospital stay. If you have any questions about your discharge medications or the care you received while you were in the hospital after you are discharged, you can call the unit and asked to speak with the hospitalist on call if the hospitalist that took care of you is not available. Once you are discharged, your primary care physician will handle any further medical issues. Please note that NO REFILLS for any discharge medications will be authorized once you are discharged, as it is imperative that you return to your primary care physician (or establish a relationship with a primary care physician if you do not have one) for your aftercare needs so that they can reassess your need for medications and monitor your lab values.

## 2017-11-16 NOTE — Progress Notes (Signed)
Pt discharged to [facility], Nurse Ngozi contacted and report given.

## 2017-11-16 NOTE — Progress Notes (Signed)
Inpatient Diabetes Program Recommendations  AACE/ADA: New Consensus Statement on Inpatient Glycemic Control (2015)  Target Ranges:  Prepandial:   less than 140 mg/dL      Peak postprandial:   less than 180 mg/dL (1-2 hours)      Critically ill patients:  140 - 180 mg/dL   Lab Results  Component Value Date   GLUCAP 187 (H) 11/16/2017   HGBA1C 6.7 (H) 05/24/2017    Review of Glycemic Control Results for SAREN, CORKERN (MRN [id number]) as of 11/16/2017 11:48  Ref. Range 11/16/2017 00:00 11/16/2017 04:19 11/16/2017 08:21  Glucose-Capillary Latest Ref Range: 70 - 99 mg/dL 232 (H) 174 (H) 187 (H)   Home DM Meds:70/30 Insulin- 45 units AM/ 50 units PM  Current Orders:Novolog Sensitive Correction Scale/ SSI (0-9 units)Q4 hours Poor PO intake noted per MD notes--Getting Dysphagic diet.  Inpatient Diabetes Program Recommendations:    Start low dose basal insulin- Recommend Lantus 10 units QHS (0.1 units/kg dosing based on weight of 108 kg--Takes 70/30 Insulin at home but 70/30 Insulin not ideal for patients with poor PO intake, may not be safest option for DC to SNF).  Thanks, Bronson Curb, MSN, RNC-OB Diabetes Coordinator [phone] (8a-5p)

## 2017-11-16 NOTE — Progress Notes (Signed)
[facility] has Biochemist, clinical for patient to discharge there today. Patient's spouse notified and going to sign her admission paperwork.   Percell Locus Jakyria Bleau LCSW [phone]

## 2017-11-16 NOTE — Discharge Summary (Signed)
Lauren Crosby, is a 84 y.o. female  DOB Jul 17, 1931  MRN [id number].  Admission date:  10/30/2017  Admitting Physician  Caren Griffins, MD  Discharge Date:  11/16/2017   Primary MD  [name], MD  Recommendations for primary care physician for things to follow:  -Check CBC, BMP in 1 week to ensure continued provement of renal function -Patient on 2 L nasal cannula wean as tolerated -Dysphagia 2 diet, to be followed by SLP, advance as tolerated  CODE STATUS: DNR   Admission Diagnosis  Tachypnea [R06.82] Diarrhea, unspecified type [R19.7]   Discharge Diagnosis  Tachypnea [R06.82] Diarrhea, unspecified type [R19.7]    Active Problems:   HTN (hypertension)   Morbid obesity ([place])   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 ([place])   Hypertension   Hyperlipemia   ARF (acute renal failure) (HCC)   Acute kidney failure (HCC)   Metabolic acidosis, normal anion gap (NAG)   Abdominal pain   Diarrhea   CKD (chronic kidney disease) stage 3, GFR 30-59 ml/min (HCC)   Colitis, acute   ATN (acute tubular necrosis) (HCC)   Shock circulatory (HCC)   Hyperglycemia due to type 2 diabetes mellitus ([place])   Encephalopathy acute   Goals of care, counseling/discussion   Acute respiratory failure with hypoxemia ([place])   Acute pulmonary edema ([place])   Acute encephalopathy   Palliative care by specialist      Past Medical History:  Diagnosis Date  . Adrenal adenoma   . Anemia   . Arthritis    OA AND PAIN BOTH KNEES BUT RIGHT KNEE PAIN WORSE; SOME LOWER BACK PAIN  . Cellulitis and abscess of trunk   . Colon polyp    adenomatous  . Diabetes ([place])    type 2  PT IS ON ORAL MEDICATION AND INSULIN  . Diverticulitis   . GERD (gastroesophageal reflux disease)    NO MEDS FOR GERD  . Goiter   . Gout   . Hepatitis    YELLOW JAUNDICE WHEN A CHILD  . Hernia   . Hyperlipemia   . Hypertension   .  Nephrolithiasis   . 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 Obesity   . Open wound of abdominal wall, anterior, without mention of complication   . Pneumonia    IN THE PAST  . Retinopathy due to secondary diabetes mellitus ([place])   . Shortness of breath    PT IS NOT ABLE TO BE ACTIVE BECAUSE OF KNEE PROBLEM; HAS ALWAYS HAD PROBLEM WITH BREATHING HEAVY - DOES GET SOB WITH EXERTION.  Marland Kitchen Thalassemia minor     Past Surgical History:  Procedure Laterality Date  . APPENDECTOMY     1993 PT HAD APPENDECTOMY AND ABDOMINAL SURGERY FRO DIVERTICULITS  . CARDIAC CATHETERIZATION  2008   conclusion: smooth and normal coronary arteries, normal left ventricular systolic function  . CHOLECYSTECTOMY    . COLONOSCOPY  10/04/06  . CYSTOSCOPY WITH BIOPSY N/A 06/16/2016   Procedure: CYSTOSCOPY WITH  BLADDER BIOPSY  AND FULGERATION 1CM;  Surgeon: Festus Aloe, MD;  Location: [facility];  Service: Urology;  Laterality: N/A;  . DIAGNOSTIC MAMMOGRAM  03/17/2011  . IR FLUORO GUIDE CV LINE RIGHT  11/03/2017  . IR US GUIDE VASC ACCESS RIGHT  11/03/2017  . ROTATOR CUFF REPAIR Left   . SIGMOID RESECTION / RECTOPEXY    . TONSILLECTOMY    . TOTAL ABDOMINAL HYSTERECTOMY    . TOTAL KNEE ARTHROPLASTY Right 08/11/2013   Procedure: RIGHT TOTAL KNEE ARTHROPLASTY;  Surgeon: Mauri Pole, MD;  Location: [facility];  Service: Orthopedics;  Laterality: Right;  . VENTRAL HERNIA REPAIR  2007       History of present illness and  Hospital Course:     Kindly see H&P for history of present illness and admission details, please review complete Labs, Consult reports and Test reports for all details in brief  HPI  from the history and physical done on the day of admission 10/30/2017  HPI: Lauren Crosby is a 84 y.o. female with medical history significant of diabetes mellitus, chronic kidney disease stage III hypertension, hyperlipidemia, morbid obesity, chronic lower extremity swelling presents to the hospital with  chief complaint of diarrhea and weakness.  Patient's husband tells me that about 8 or 9 days ago she became extremely constipated.  He went to the pharmacy and bought some Dulcolax, and he was told to give 1 pill on day 1, and if that does not work then 2 pills and if that does not work even 3 pills in the day after.  He did just that, however afterwards patient has been having significant profuse watery diarrhea that is been going on for the past 5 straight days despite receiving no further laxatives.  She also been complaining of crampy abdominal pain.  They have also seen traces of blood in her diarrhea.  She denies any fever chills, denies any nausea or vomiting.  States that her appetite has been okay and she is able to eat and drink.  She has had progressive weakness, now barely able to ambulate.  She denies any fever or chills.  She denies any sick contacts.  She denies any antibiotics in the last couple of months.  ED Course: In the emergency room her vital signs are stable, she is satting well on room air and she is afebrile.  Blood work reveals a creatinine of 4, mild hyponatremia with a sodium of 131, bicarb 18, anemia with a hemoglobin of 8.2.  CT scan of the abdomen and pelvis revealed wall thickening of the rectosigmoid colon with mild pericolonic infiltrative changes question diverticulitis versus colitis  Hospital Course    84 year old with past medical history relevant for type 2 diabetes on insulin, 3 CKD, hypertension, hyperlipidemia, chronic bilateral lower extremity weakness, class III obesity admitted on 10/30/2017 with C. difficile colitis and diarrhea and diffuse weakness with a prolonged hospital course complicated by septic shock and AKI requiring initiation of CRRT and ICU stay and encephalopathy.  Septic shock,  -This with hypotension, acute renal failure, secondary to infectious process and volume depletion, from C. difficile colitis  -Required brief stay at ICU as long as she  required pressors . -Physiology of sepsis has resolved, she has finished her antibiotic course   ARF due to #1 -Secondary to above, she did require CRRT in ICU, she has been off CRRT since 11/10/2017, continues to have improving renal function, with good urine output, creatinine is 1.6 today, peri-hemodialysis catheter discontinued 11/13/2017, creatinine is  1.6 a day of discharge, with stable potassium level, no further requirement for dialysis, she will be discharged on Lasix 40 mg oral daily, avoid nephrotoxic medications and repeat BMP in 1 week, Foley catheter was discontinued yesterday, with good urine output .  Metabolic and toxic encephalopathy.   -Due to above,Improving.  Supportive care, remains at risk for delirium, minimize narcotics and benzodiazepines.  Palliative care on board.  Currently DNR but husband wants to pursue present line of care.  History of gout.    All the pain is been stopped on discharge, to be resumed if functions continues to improve  Essential hypertension.    Initially blood pressure on the lower side, where she did require Midodrin, actually her blood pressure currently elevated, so Midrin has been stopped on discharge and she was resumed on a lower dose metoprolol  Dyslipidemia.  Continue home dose statin.  Dysphagia.  Speech therapy on board currently on dysphagia 2 diet, continue with SLP follow-up at facility and advance as tolerated   Diffuse weakness, deconditioning.  PT consulted, discharged to SN[facility] deficiency microcytic anemia along with acute anemia of acute sickness.  No signs of active bleeding, most of the anemia is due to multiple blood draws on top of microcytic iron deficiency anemia, transfused 2 units of packed RBC on 11/11/2017, anemia panel noted placed on PO + IV Iron dosed by pharmacy.  Placed on famotidine instead of PPI due to recent C. Difficile.,  She will be discharged on iron supplements as well  DM type II.  Currently on sliding  scale, and started on Lantus insulin insulin 70/30  Discharge Condition:  Stable   Follow UP  Contact information for after-discharge care    Destination    [facility] Preferred SNF .   Service:  Skilled Nursing Contact information: [address] [place] [phone]                Discharge Instructions  and  Discharge Medications     Discharge Instructions    Discharge instructions   Complete by:  As directed    Follow with Primary MD [name], MD SNF physician  Get CBC, CMP, checked  by Primary MD next visit.    Activity: As tolerated with Full fall precautions use walker/cane & assistance as needed   Disposition SNF   Diet: Dysphagia 2, heart healthy carb modified, with feeding assistance and full aspiration precaution  For Heart failure patients - Check your Weight same time everyday, if you gain over 2 pounds, or you develop in leg swelling, experience more shortness of breath or chest pain, call your Primary MD immediately. Follow Cardiac Low Salt Diet and 1.5 lit/day fluid restriction.   On your next visit with your primary care physician please Get Medicines reviewed and adjusted.   Please request your Prim.MD to go over all Hospital Tests and Procedure/Radiological results at the follow up, please get all Hospital records sent to your Prim MD by signing hospital release before you go home.   If you experience worsening of your admission symptoms, develop shortness of breath, life threatening emergency, suicidal or homicidal thoughts you must seek medical attention immediately by calling 911 or calling your MD immediately  if symptoms less severe.  You Must read complete instructions/literature along with all the possible adverse reactions/side effects for all the Medicines you take and that have been prescribed to you. Take any new Medicines after you have completely understood and  accpet all the  possible adverse reactions/side effects.   Do not drive, operating heavy machinery, perform activities at heights, swimming or participation in water activities or provide baby sitting services if your were admitted for syncope or siezures until you have seen by Primary MD or a Neurologist and advised to do so again.  Do not drive when taking Pain medications.    Do not take more than prescribed Pain, Sleep and Anxiety Medications  Special Instructions: If you have smoked or chewed Tobacco  in the last 2 yrs please stop smoking, stop any regular Alcohol  and or any Recreational drug use.  Wear Seat belts while driving.   Please note  You were cared for by a hospitalist during your hospital stay. If you have any questions about your discharge medications or the care you received while you were in the hospital after you are discharged, you can call the unit and asked to speak with the hospitalist on call if the hospitalist that took care of you is not available. Once you are discharged, your primary care physician will handle any further medical issues. Please note that NO REFILLS for any discharge medications will be authorized once you are discharged, as it is imperative that you return to your primary care physician (or establish a relationship with a primary care physician if you do not have one) for your aftercare needs so that they can reassess your need for medications and monitor your lab values.   Increase activity slowly   Complete by:  As directed      Allergies as of 11/16/2017      Reactions   Penicillins Other (See Comments)   Has patient had a PCN reaction causing immediate rash, facial/tongue/throat swelling, SOB or lightheadedness with hypotension: No Has patient had a PCN reaction causing severe rash involving mucus membranes or skin necrosis: No Has patient had a PCN reaction that required hospitalization: No Has patient had a PCN reaction occurring within the last 10  years: Unknown If all of the above answers are "NO", then may proceed with Cephalosporin use.      Medication List    STOP taking these medications   allopurinol 300 MG tablet Commonly known as:  ZYLOPRIM   esomeprazole 40 MG capsule Commonly known as:  NEXIUM   insulin NPH-regular Human (70-30) 100 UNIT/ML injection     TAKE these medications   feeding supplement (PRO-STAT SUGAR FREE 64) Liqd Take 30 mLs by mouth 2 (two) times daily between meals.   ferrous sulfate 325 (65 FE) MG tablet Take 1 tablet (325 mg total) by mouth 2 (two) times daily with a meal.   furosemide 20 MG tablet Commonly known as:  LASIX Take 2 tablets (40 mg total) by mouth daily. What changed:  how much to take   guaiFENesin-dextromethorphan 100-10 MG/5ML syrup Commonly known as:  ROBITUSSIN DM Take 5 mLs by mouth every 4 (four) hours as needed for cough.   insulin aspart 100 UNIT/ML injection Commonly known as:  novoLOG Inject 0-9 Units into the skin every 4 (four) hours.   insulin glargine 100 UNIT/ML injection Commonly known as:  LANTUS Inject 0.1 mLs (10 Units total) into the skin at bedtime.   ipratropium-albuterol 0.5-2.5 (3) MG/3ML Soln Commonly known as:  DUONEB Take 3 mLs by nebulization every 4 (four) hours as needed.   metoprolol tartrate 25 MG tablet Commonly known as:  LOPRESSOR Take 0.5 tablets (12.5 mg total) by mouth 2 (two) times daily. What  changed:  how much to take   multivitamin with minerals Tabs tablet Take 1 tablet by mouth daily.   PRESERVISION AREDS 2+MULTI VIT PO Take 1 capsule by mouth 2 (two) times daily.   ranitidine 150 MG/10ML syrup Commonly known as:  ZANTAC Take 5 mLs (75 mg total) by mouth 2 (two) times daily.   simvastatin 80 MG tablet Commonly known as:  ZOCOR Take 80 mg by mouth at bedtime.         Diet and Activity recommendation: See Discharge Instructions above   Consults obtained -    Renal, PCCM, Palliative Care Major  procedures and Radiology Reports - PLEASE review detailed and final reports for all details, in brief -    [facility] catheter placement on 11/03/2017, discontinued 11/14/2017  CRRT initiated on 11/03/2017, last on 11/10/2017  2 Units PRBCs 11/11/17   Ct Abdomen Pelvis Wo Contrast  Result Date: 10/30/2017 CLINICAL DATA:  Abdominal distension, diarrhea for 5 days, blood in diarrhea, unable to control bladder or bowel, history of adrenal adenoma, diabetes mellitus, hypertension, nephrolithiasis, renal failure EXAM: CT ABDOMEN AND PELVIS WITHOUT CONTRAST TECHNIQUE: Multidetector CT imaging of the abdomen and pelvis was performed following the standard protocol without IV contrast. Sagittal and coronal MPR images reconstructed from axial data set. Patient drank dilute oral contrast for exam COMPARISON:  05/24/2017 FINDINGS: Lower chest: Bibasilar atelectasis and peribronchial thickening Hepatobiliary: Gallbladder surgically absent. No focal hepatic abnormalities Pancreas: Pancreatic atrophy without mass Spleen: Normal appearance Adrenals/Urinary Tract: Low-attenuation LEFT adrenal mass 3.1 x 2.6 cm consistent with adenoma. RIGHT adrenal gland unremarkable. BILATERAL renal cortical atrophy. Exophytic intermediate attenuation mass lateral aspect of inferior RIGHT kidney, 3.7 x 3.6 cm image 56. Minimal prominence of LEFT renal pelvis and LEFT ureter in the pelvis without obstructing calculus. RIGHT ureter and bladder normal appearance. Stomach/Bowel: Appendix surgically absent by history. Scattered colonic diverticulosis. Wall thickening of sigmoid colon with mild pericolic infiltrative changes. Additional wall thickening at the rectosigmoid colon. Findings could represent acute diverticulitis or colitis. No abscess or extraluminal gas. Stomach and small bowel loops unremarkable. Vascular/Lymphatic: Atherosclerotic calcifications aorta and iliac arteries. Coronary arterial calcifications. Aorta normal caliber. No  adenopathy. Reproductive: Uterus surgically absent.  Normal appearing ovaries. Other: No free air or free fluid. Tiny LEFT parasagittal ventral hernia inferior to the umbilicus. Musculoskeletal: Demineralized with degenerative disc and facet disease changes lumbar spine. IMPRESSION: Diffuse colonic diverticulosis. Wall thickening of the rectosigmoid colon with mild pericolic infiltrative changes, question diverticulitis versus colitis. No evidence of perforation or abscess. LEFT adrenal adenoma 3.1 x 2.6 cm. Grossly unchanged appearance of an exophytic mass 3.7 x 3.6 cm at the lateral RIGHT kidney, question complicated cyst; lesion is unchanged since 05/24/2017 but minimally larger than seen on 02/10/2015 when it measured 3.5 x 3.2 cm, recommended attention on follow-up imaging to establish long-term stability. Electronically Signed   By: Lavonia Dana M.D.   On: 10/30/2017 13:03   Dg Abd 1 View  Result Date: 11/09/2017 CLINICAL DATA:  Patient pulled out nasogastric catheter EXAM: ABDOMEN - 1 VIEW COMPARISON:  11/07/2017 FINDINGS: Scattered large and small bowel gas is noted. The previously seen nasogastric catheter has been removed in the interval. Despite the given clinical history of metal tipped catheter this catheter on previous images represented a standard nasogastric catheter without metallic weighting. No foreign body is noted. IMPRESSION: No evidence of retained foreign body as the prior nasogastric catheter was not a weighted tipped catheter based on previous images. Electronically Signed   By:  Inez Catalina M.D.   On: 11/09/2017 19:57   Dg Abd 1 View  Result Date: 11/07/2017 CLINICAL DATA:  84 year old female with feeding tube placement. EXAM: ABDOMEN - 1 VIEW COMPARISON:  Earlier abdominal radiograph dated 11/07/2017 FINDINGS: No significant change in the appearance or positioning of the enteric tube which appears somewhat folded, likely in the mid to distal stomach. IMPRESSION: No significant  interval change. Electronically Signed   By: Anner Crete M.D.   On: 11/07/2017 23:00   Dg Abd 1 View  Result Date: 11/03/2017 CLINICAL DATA:  Abdominal distension. EXAM: ABDOMEN - 1 VIEW COMPARISON:  CT, 10/30/2017 FINDINGS: Normal bowel gas pattern with no evidence of obstruction. Soft tissues are not well-defined due to body habitus. No convincing renal or ureteral stones. Status post cholecystectomy. No acute skeletal abnormality. IMPRESSION: 1. No acute findings.  No evidence of bowel obstruction. Electronically Signed   By: Lajean Manes M.D.   On: 11/03/2017 11:17   Ir Fluoro Guide Cv Line Right  Result Date: 11/03/2017 INDICATION: End-stage renal disease. In need intravenous access for the initiation of hemodialysis. EXAM: NON-TUNNELED CENTRAL VENOUS HEMODIALYSIS CATHETER PLACEMENT WITH ULTRASOUND AND FLUOROSCOPIC GUIDANCE COMPARISON:  Chest radiograph - 10/31/2017 MEDICATIONS: None FLUOROSCOPY TIME:  24 seconds (14 mGy) COMPLICATIONS: None immediate. PROCEDURE: Informed written consent was obtained from patient's family after a discussion of the risks, benefits, and alternatives to treatment. Questions regarding the procedure were encouraged and answered. The right neck and chest were prepped with chlorhexidine in a sterile fashion, and a sterile drape was applied covering the operative field. Maximum barrier sterile technique with sterile gowns and gloves were used for the procedure. A timeout was performed prior to the initiation of the procedure. After the overlying soft tissues were anesthetized, a small venotomy incision was created and a micropuncture kit was utilized to access the internal jugular vein. Real-time ultrasound guidance was utilized for vascular access including the acquisition of a permanent ultrasound image documenting patency of the accessed vessel. The microwire was utilized to measure appropriate catheter length. A stiff glidewire was advanced to the level of the IVC.  Under fluoroscopic guidance, the venotomy was serially dilated, ultimately allowing placement of a 20 cm temporary Trialysis catheter with tip ultimately terminating within the superior aspect of the right atrium. Final catheter positioning was confirmed and documented with a spot radiographic image. The catheter aspirates and flushes normally. The catheter was flushed with appropriate volume heparin dwells. The catheter exit site was secured with a 0-Prolene retention suture. A dressing was placed. The patient tolerated the procedure well without immediate post procedural complication. IMPRESSION: Successful placement of a right internal jugular approach 20 cm temporary dialysis catheter with tip terminating with in the superior aspect of the right atrium. The catheter is ready for immediate use. PLAN: This catheter may be converted to a tunneled dialysis catheter at a later date as indicated. Electronically Signed   By: Sandi Mariscal M.D.   On: 11/03/2017 18:01   Ir US Guide Vasc Access Right  Result Date: 11/03/2017 INDICATION: End-stage renal disease. In need intravenous access for the initiation of hemodialysis. EXAM: NON-TUNNELED CENTRAL VENOUS HEMODIALYSIS CATHETER PLACEMENT WITH ULTRASOUND AND FLUOROSCOPIC GUIDANCE COMPARISON:  Chest radiograph - 10/31/2017 MEDICATIONS: None FLUOROSCOPY TIME:  24 seconds (14 mGy) COMPLICATIONS: None immediate. PROCEDURE: Informed written consent was obtained from patient's family after a discussion of the risks, benefits, and alternatives to treatment. Questions regarding the procedure were encouraged and answered. The right neck and chest were  prepped with chlorhexidine in a sterile fashion, and a sterile drape was applied covering the operative field. Maximum barrier sterile technique with sterile gowns and gloves were used for the procedure. A timeout was performed prior to the initiation of the procedure. After the overlying soft tissues were anesthetized, a small  venotomy incision was created and a micropuncture kit was utilized to access the internal jugular vein. Real-time ultrasound guidance was utilized for vascular access including the acquisition of a permanent ultrasound image documenting patency of the accessed vessel. The microwire was utilized to measure appropriate catheter length. A stiff glidewire was advanced to the level of the IVC. Under fluoroscopic guidance, the venotomy was serially dilated, ultimately allowing placement of a 20 cm temporary Trialysis catheter with tip ultimately terminating within the superior aspect of the right atrium. Final catheter positioning was confirmed and documented with a spot radiographic image. The catheter aspirates and flushes normally. The catheter was flushed with appropriate volume heparin dwells. The catheter exit site was secured with a 0-Prolene retention suture. A dressing was placed. The patient tolerated the procedure well without immediate post procedural complication. IMPRESSION: Successful placement of a right internal jugular approach 20 cm temporary dialysis catheter with tip terminating with in the superior aspect of the right atrium. The catheter is ready for immediate use. PLAN: This catheter may be converted to a tunneled dialysis catheter at a later date as indicated. Electronically Signed   By: Sandi Mariscal M.D.   On: 11/03/2017 18:01   Dg Chest Port 1 View  Result Date: 11/13/2017 CLINICAL DATA:  Shortness of breath EXAM: PORTABLE CHEST 1 VIEW COMPARISON:  11/09/2017 FINDINGS: Cardiomegaly and slightly increased pulmonary vascular congestion noted. Mild bibasilar opacities/atelectasis noted in this low volume film. A RIGHT central venous catheter with tips overlying the LOWER SVC/SUPERIOR cavoatrial junction noted. No pneumothorax. IMPRESSION: Cardiomegaly with pulmonary vascular congestion and mild bibasilar opacities/atelectasis. Electronically Signed   By: Margarette Canada M.D.   On: 11/13/2017 07:49     Dg Chest Port 1 View  Result Date: 11/09/2017 CLINICAL DATA:  Patient removed recent nasogastric catheter EXAM: PORTABLE CHEST 1 VIEW COMPARISON:  11/08/2017 FINDINGS: Cardiac shadow is enlarged. Right jugular central line is again seen and stable. Inspiratory effort is poor with crowding of the vascular markings. No focal confluent infiltrate is seen. No radiopaque foreign body is noted. The previously seen nasogastric catheter has been removed and as previously described was not a weighted tipped catheter. IMPRESSION: Poor inspiratory effort.  No acute abnormality noted. Electronically Signed   By: Inez Catalina M.D.   On: 11/09/2017 19:58   Dg Chest Port 1 View  Result Date: 11/08/2017 CLINICAL DATA:  Respiratory failure. EXAM: PORTABLE CHEST 1 VIEW COMPARISON:  11/07/2017 and older exams. FINDINGS: Mild enlargement of the cardiopericardial silhouette, stable. Prominent bronchovascular markings, with additional lung base opacity, the latter consistent with atelectasis, also without change from the previous day's exam. New nasal/orogastric tube passes below the included field of view, likely into the stomach. Right subclavian dual lumen central venous catheter is stable, tip in the right atrium. No pneumothorax. IMPRESSION: 1. No significant change in lung aeration from the previous day's study. There are prominent bronchovascular markings and lung base atelectasis, but no convincing pulmonary edema or pneumonia. 2. New nasal/orogastric tube. Tip not visualized, but likely extends into the stomach. Electronically Signed   By: Lajean Manes M.D.   On: 11/08/2017 07:13   Dg Chest Port 1 View  Result Date: 11/07/2017 CLINICAL  DATA:  Acute respiratory failure with hypoxia. EXAM: PORTABLE CHEST 1 VIEW COMPARISON:  10/31/2017. FINDINGS: Cardiomegaly. Improved lung volumes. Increased perihilar markings could represent early edema or viral pneumonitis. Other than improved lung markings, similar appearance  to priors. Dialysis catheter has been inserted via RIGHT IJ approach. Tip slide in the RIGHT atrium. There is no pneumothorax. IMPRESSION: Satisfactory catheter placement. No significant change in the appearance of the lungs except for improved volume. Cardiomegaly. Electronically Signed   By: Staci Righter M.D.   On: 11/07/2017 11:30   Dg Chest Port 1 View  Result Date: 10/31/2017 CLINICAL DATA:  84 year old female with shortness of breath. EXAM: PORTABLE CHEST 1 VIEW COMPARISON:  Portable chest 10/30/2017 and earlier. CT Abdomen and Pelvis 10/30/2017. FINDINGS: There did not appear to be overt pulmonary edema at the lung bases yesterday by CT. Lower lobe bronchiectasis and peribronchial thickening was noted. Continued low lung volumes stable appearance of the pulmonary interstitium today from May this year. Stable mild cardiomegaly. Visualized tracheal air column is within normal limits. No pneumothorax, pleural effusion or confluent opacity. Large body habitus. No acute osseous abnormality identified. IMPRESSION: Persistent low lung volumes. No focal pulmonary abnormality. Consider viral or atypical respiratory infection given the appearance of the lung bases on CT Abdomen and Pelvis yesterday. Electronically Signed   By: Genevie Ann M.D.   On: 10/31/2017 13:18   Dg Chest Port 1 View  Result Date: 10/30/2017 CLINICAL DATA:  84 year old female with constipation and watery diarrhea. Initial encounter. EXAM: PORTABLE CHEST 1 VIEW COMPARISON:  05/27/2017. FINDINGS: Cardiomegaly. Asymmetric mild pulmonary edema superimposed upon chronic changes. Calcified aorta. IMPRESSION: 1. Asymmetric mild pulmonary edema. 2. Cardiomegaly. 3.  Aortic Atherosclerosis (ICD10-I70.0). Electronically Signed   By: Genia Del M.D.   On: 10/30/2017 15:06   Dg Abd Portable 1v  Result Date: 11/07/2017 CLINICAL DATA:  Nasogastric tube placement. EXAM: PORTABLE ABDOMEN - 1 VIEW COMPARISON:  11/03/2017 FINDINGS: Interval  nasogastric tube folded back upon itself in the mid to distal stomach with its tip in the mid stomach. The included bowel gas pattern is normal. Prominent interstitial markings at the left lung base. Lower thoracic spine degenerative changes. IMPRESSION: Nasogastric tube folded back upon itself in the mid to distal stomach with its tip in the mid stomach. Electronically Signed   By: Claudie Revering M.D.   On: 11/07/2017 13:13    Micro Results    No results found for this or any previous visit (from the past 240 hour(s)).     Today   Subjective:   Lauren Crosby today has no headache,no chest abdominal pain feels much better today.   Objective:   Blood pressure (!) 144/54, pulse 75, temperature 97.8 F (36.6 C), temperature source Oral, resp. rate 19, height _0  (1.575 m), weight 109.8 kg, SpO2 94 %.   Intake/Output Summary (Last 24 hours) at 11/16/2017 1314 Last data filed at 11/16/2017 0900 Gross per 24 hour  Intake 236 ml  Output 2300 ml  Net -2064 ml    Exam  Awake Alert, more  conversant and appropriate today Symmetrical Chest wall movement, Good air movement bilaterally, CTAB RRR,No Gallops,Rubs or new Murmurs, No Parasternal Heave +ve B.Sounds, Abd Soft, No tenderness, No rebound - guarding or rigidity. No Cyanosis, Clubbing or edema, No new Rash or bruise    Data Review   CBC w Diff:  Lab Results  Component Value Date   WBC 4.4 11/15/2017   HGB 8.0 (L) 11/15/2017   HGB  9.1 (L) 10/26/2009   HCT 27.5 (L) 11/15/2017   HCT 28.0 (L) 10/26/2009   PLT 126 (L) 11/15/2017   PLT 222 10/26/2009   LYMPHOPCT 11 10/30/2017   LYMPHOPCT 30.1 10/26/2009   MONOPCT 10 10/30/2017   MONOPCT 8.2 10/26/2009   EOSPCT 0 10/30/2017   EOSPCT 1.2 10/26/2009   BASOPCT 0 10/30/2017   BASOPCT 0.4 10/26/2009    CMP:  Lab Results  Component Value Date   NA 140 11/16/2017   K 4.5 11/16/2017   CL 105 11/16/2017   CO2 28 11/16/2017   BUN 49 (H) 11/16/2017   CREATININE 1.64 (H)  11/16/2017   CREATININE 0.85 08/14/2012   PROT 6.3 (L) 11/10/2017   ALBUMIN 2.8 (L) 11/16/2017   BILITOT 0.7 11/10/2017   ALKPHOS 101 11/10/2017   AST 35 11/10/2017   ALT 22 11/10/2017  .   Total Time in preparing paper work, data evaluation and todays exam - 68 minutes  Phillips Climes M.D on 11/16/2017 at 1:14 PM  [place]  [phone]

## 2017-11-16 NOTE — Clinical Social Work Placement (Signed)
   CLINICAL SOCIAL WORK PLACEMENT  NOTE  Date:  11/16/2017  Patient Details  Name: Lauren Crosby MRN: [id number] Date of Birth: 01/30/1931  Clinical Social Work is seeking post-discharge placement for this patient at the [place] level of care (*CSW will initial, date and re-position this form in  chart as items are completed):  Yes   Patient/family provided with [place] Work Department's list of facilities offering this level of care within the geographic area requested by the patient (or if unable, by the patient's family).  Yes   Patient/family informed of their freedom to choose among providers that offer the needed level of care, that participate in Medicare, Medicaid or managed care program needed by the patient, have an available bed and are willing to accept the patient.  Yes   Patient/family informed of [facility]'s ownership interest in [facility] and [facility], as well as of the fact that they are under no obligation to receive care at these facilities.  PASRR submitted to EDS on       PASRR number received on       Existing PASRR number confirmed on 11/14/17     FL2 transmitted to all facilities in geographic area requested by pt/family on 11/14/17     FL2 transmitted to all facilities within larger geographic area on       Patient informed that his/her managed care company has contracts with or will negotiate with certain facilities, including the following:        Yes   Patient/family informed of bed offers received.  Patient chooses bed at [facility]     Physician recommends and patient chooses bed at      Patient to be transferred to [facility] on 11/16/17.  Patient to be transferred to facility by [facility]     Patient family notified on 11/16/17 of transfer.  Name of family member notified:  Spouse     PHYSICIAN Please prepare priority discharge summary, including  medications     Additional Comment:    _______________________________________________ Benard Halsted, LCSW 11/16/2017, 12:40 PM

## 2017-11-16 NOTE — Progress Notes (Signed)
Patient will DC to: [facility] Anticipated DC date: 11/16/17 Family notified: Spouse Transport by: Corey Harold   Per MD patient ready for DC to [facility]. RN, patient, patient's family, and facility notified of DC. Discharge Summary and FL2 sent to facility. RN to call report prior to discharge [phone]). DC packet on chart. Ambulance transport requested for patient.   CSW will sign off for now as social work intervention is no longer needed. Please consult [place] again if new needs arise.  Cedric Fishman, LCSW Clinical Social Worker [phone]

## 2017-11-16 NOTE — Progress Notes (Signed)
Discharge instructions, prescriptions, and care notes given to transport.  Pt left the floor via stretcher with [facility] in stable condition.

## 2017-11-19 ENCOUNTER — Non-Acute Institutional Stay (SKILLED_NURSING_FACILITY): Payer: Commercial Managed Care - HMO | Admitting: Internal Medicine

## 2017-11-19 ENCOUNTER — Encounter: Payer: Self-pay | Admitting: Internal Medicine

## 2017-11-19 DIAGNOSIS — N183 Chronic kidney disease, stage 3 unspecified: Secondary | ICD-10-CM

## 2017-11-19 DIAGNOSIS — Z794 Long term (current) use of insulin: Secondary | ICD-10-CM

## 2017-11-19 DIAGNOSIS — R579 Shock, unspecified: Secondary | ICD-10-CM | POA: Diagnosis not present

## 2017-11-19 DIAGNOSIS — E785 Hyperlipidemia, unspecified: Secondary | ICD-10-CM

## 2017-11-19 DIAGNOSIS — R6521 Severe sepsis with septic shock: Secondary | ICD-10-CM | POA: Diagnosis not present

## 2017-11-19 DIAGNOSIS — A419 Sepsis, unspecified organism: Secondary | ICD-10-CM | POA: Diagnosis not present

## 2017-11-19 DIAGNOSIS — A0472 Enterocolitis due to Clostridium difficile, not specified as recurrent: Secondary | ICD-10-CM

## 2017-11-19 DIAGNOSIS — N170 Acute kidney failure with tubular necrosis: Secondary | ICD-10-CM

## 2017-11-19 DIAGNOSIS — E1169 Type 2 diabetes mellitus with other specified complication: Secondary | ICD-10-CM

## 2017-11-19 DIAGNOSIS — G9340 Encephalopathy, unspecified: Secondary | ICD-10-CM

## 2017-11-19 DIAGNOSIS — I10 Essential (primary) hypertension: Secondary | ICD-10-CM | POA: Diagnosis not present

## 2017-11-19 DIAGNOSIS — E1122 Type 2 diabetes mellitus with diabetic chronic kidney disease: Secondary | ICD-10-CM | POA: Diagnosis not present

## 2017-11-19 NOTE — Progress Notes (Signed)
:  Location:  [facility] Room Number: 425Z Place of Service:  SNF (31)  Anne D. Sheppard Coil, MD  Patient Care Team: [name], MD as PCP - General (Endocrinology) Stanford Breed Denice Bors, MD as PCP - Cardiology (Cardiology)  Extended Emergency Contact Information Primary Emergency Contact: Slovacek,Donald Address: [address], [place] [address] Johnnette Litter of [place] Phone: [phone] Mobile Phone: [phone] Relation: Spouse Secondary Emergency Contact: Fredderick Phenix Mobile Phone: [phone] Relation: Daughter     Allergies: Penicillins  Chief Complaint  Patient presents with  . New Admit To SNF    Admit to [facility]    HPI: Patient is 84 y.o. female with diabetes mellitus, chronic kidney disease stage III, hypertension, hyperlipidemia, morbid obesity, chronic lower extremities swelling who presented to the emergency department with complaint of diarrhea and weakness.  Patient has been said that patient has been extremely constipated 8 to 9 days ago and nectar multiple doses of Dulcolax patient starting having profuse watery diarrhea that have been going on for the past 5 days straight despite receiving no further laxatives.  Admitted traces of blood in the diarrhea.  She denied any fever, chills, nausea, or vomiting.  Patient appetite was okay and she was able to eat and drink.  She has had progressive weakness and was barely able to ambulate.  In the emergency department her vital signs are stable blood work showed a creatinine of 4 sodium 131 and anemia with a hemoglobin 8.2.  CT scan abdomen and pelvis revealed wall thickening of the rectosigmoid colon with mild pericolonic chronic infiltrative changes question diverticulitis versus colitis.  Ultimately patient cannot became septic, hypertension, acute renal failure, all secondary to C. difficile colitis requiring a brief stay in the ICU as well as pressors, which are  resolved she finished her antibiotic course.  Patient is acute renal failure required.  Hemodialysis from 11-2 11/ 5.  Patient was encephalopathic majority of the time but did begin to improve before discharge.  Patient is admitted to skilled nursing facility for OT/PT.  While at skilled nursing facility patient will be followed for diabetes mellitus 2 treated with insulin, hypertension treated with metoprolol and Lasix and hyperlipidemia treated with Pravachol.  Past Medical History:  Diagnosis Date  . Adrenal adenoma   . Anemia   . Arthritis    OA AND PAIN BOTH KNEES BUT RIGHT KNEE PAIN WORSE; SOME LOWER BACK PAIN  . Cellulitis and abscess of trunk   . Colon polyp    adenomatous  . Diabetes ([place])    type 2  PT IS ON ORAL MEDICATION AND INSULIN  . Diverticulitis   . GERD (gastroesophageal reflux disease)    NO MEDS FOR GERD  . Goiter   . Gout   . Hepatitis    YELLOW JAUNDICE WHEN A CHILD  . Hernia   . Hyperlipemia   . Hypertension   . Nephrolithiasis   . 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 Obesity   . Open wound of abdominal wall, anterior, without mention of complication   . Pneumonia    IN THE PAST  . Retinopathy due to secondary diabetes mellitus ([place])   . Shortness of breath    PT IS NOT ABLE TO BE ACTIVE BECAUSE OF KNEE PROBLEM; HAS ALWAYS HAD PROBLEM WITH BREATHING HEAVY - DOES GET SOB WITH EXERTION.  Marland Kitchen Thalassemia minor  Past Surgical History:  Procedure Laterality Date  . APPENDECTOMY     1993 PT HAD APPENDECTOMY AND ABDOMINAL SURGERY FRO DIVERTICULITS  . CARDIAC CATHETERIZATION  2008   conclusion: smooth and normal coronary arteries, normal left ventricular systolic function  . CHOLECYSTECTOMY    . COLONOSCOPY  10/04/06  . CYSTOSCOPY WITH BIOPSY N/A 06/16/2016   Procedure: CYSTOSCOPY WITH  BLADDER BIOPSY AND FULGERATION 1CM;  Surgeon: Festus Aloe, MD;  Location: [facility];  Service: Urology;  Laterality: N/A;  . DIAGNOSTIC MAMMOGRAM   03/17/2011  . IR FLUORO GUIDE CV LINE RIGHT  11/03/2017  . IR US GUIDE VASC ACCESS RIGHT  11/03/2017  . ROTATOR CUFF REPAIR Left   . SIGMOID RESECTION / RECTOPEXY    . TONSILLECTOMY    . TOTAL ABDOMINAL HYSTERECTOMY    . TOTAL KNEE ARTHROPLASTY Right 08/11/2013   Procedure: RIGHT TOTAL KNEE ARTHROPLASTY;  Surgeon: Mauri Pole, MD;  Location: [facility];  Service: Orthopedics;  Laterality: Right;  . VENTRAL HERNIA REPAIR  2007    Allergies as of 11/19/2017      Reactions   Penicillins Other (See Comments)   Has patient had a PCN reaction causing immediate rash, facial/tongue/throat swelling, SOB or lightheadedness with hypotension: No Has patient had a PCN reaction causing severe rash involving mucus membranes or skin necrosis: No Has patient had a PCN reaction that required hospitalization: No Has patient had a PCN reaction occurring within the last 10 years: Unknown If all of the above answers are "NO", then may proceed with Cephalosporin use.      Medication List        Accurate as of 11/19/17 11:11 AM. Always use your most recent med list.          feeding supplement (PRO-STAT SUGAR FREE 64) Liqd Take 30 mLs by mouth 2 (two) times daily between meals.   ferrous sulfate 325 (65 FE) MG tablet Take 1 tablet (325 mg total) by mouth 2 (two) times daily with a meal.   furosemide 20 MG tablet Commonly known as:  LASIX Take 2 tablets (40 mg total) by mouth daily.   guaiFENesin-dextromethorphan 100-10 MG/5ML syrup Commonly known as:  ROBITUSSIN DM Take 5 mLs by mouth every 4 (four) hours as needed for cough.   insulin aspart 100 UNIT/ML injection Commonly known as:  novoLOG Inject 0-9 Units into the skin every 4 (four) hours.   insulin glargine 100 UNIT/ML injection Commonly known as:  LANTUS Inject 0.1 mLs (10 Units total) into the skin at bedtime.   ipratropium-albuterol 0.5-2.5 (3) MG/3ML Soln Commonly known as:  DUONEB Take 3 mLs by nebulization every 4 (four) hours as  needed.   metoprolol tartrate 25 MG tablet Commonly known as:  LOPRESSOR Take 0.5 tablets (12.5 mg total) by mouth 2 (two) times daily.   multivitamin with minerals Tabs tablet Take 1 tablet by mouth daily.   PRESERVISION AREDS 2+MULTI VIT PO Take 1 capsule by mouth 2 (two) times daily.   ranitidine 150 MG/10ML syrup Commonly known as:  ZANTAC Take 5 mLs (75 mg total) by mouth 2 (two) times daily.   simvastatin 80 MG tablet Commonly known as:  ZOCOR Take 80 mg by mouth at bedtime.       No orders of the defined types were placed in this encounter.   Immunization History  Administered Date(s) Administered  . Influenza Split 10/29/2016  . Pneumococcal Polysaccharide-23 09/09/2009    Social History   Tobacco Use  . Smoking  status: Never Smoker  . Smokeless tobacco: Never Used  Substance Use Topics  . Alcohol use: No    Alcohol/week: 0.0 standard drinks    Family history is   Family History  Problem Relation Age of Onset  . Diabetes Father   . Hypertension Father   . Kidney disease Father   . Liver disease Mother   . Hypertension Unknown   . Rheum arthritis Sister   . Diverticulitis Sister   . Hypertension Sister   . Colon cancer Neg Hx   . Colon polyps Neg Hx   . Esophageal cancer Neg Hx   . Pancreatic cancer Neg Hx   . Stomach cancer Neg Hx       Review of Systems  DATA OBTAINED: from patient, nurse GENERAL:  no fevers, fatigue, appetite changes SKIN: No itching, or rash EYES: No eye pain, redness, discharge EARS: No earache, tinnitus, change in hearing NOSE: No congestion, drainage or bleeding  MOUTH/THROAT: No mouth or tooth pain, No sore throat RESPIRATORY: No cough, wheezing, SOB CARDIAC: No chest pain, palpitations, lower extremity edema  GI: No abdominal pain, No N/V/D or constipation, No heartburn or reflux  GU: No dysuria, frequency or urgency, or incontinence  MUSCULOSKELETAL: No unrelieved bone/joint pain NEUROLOGIC: No headache,  dizziness or focal weakness PSYCHIATRIC: No c/o anxiety or sadness   Vitals:   11/19/17 1103  BP: (!) 148/61  Pulse: 67  Resp: 19  Temp: 99.9 F (37.7 C)    SpO2 Readings from Last 1 Encounters:  11/16/17 94%   Body mass index is 44.26 kg/m.     Physical Exam  GENERAL APPEARANCE: Alert, conversant,  No acute distress.  SKIN: No diaphoresis rash HEAD: Normocephalic, atraumatic  EYES: Conjunctiva/lids clear. Pupils round, reactive. EOMs intact.  EARS: External exam WNL, canals clear. Hearing grossly normal.  NOSE: No deformity or discharge.  MOUTH/THROAT: Lips w/o lesions  RESPIRATORY: Breathing is even, unlabored. Lung sounds are clear   CARDIOVASCULAR: Heart RRR no murmurs, rubs or gallops. No peripheral edema.   GASTROINTESTINAL: Abdomen is soft, non-tender, not distended w/ normal bowel sounds. GENITOURINARY: Bladder non tender, not distended  MUSCULOSKELETAL: No abnormal joints or musculature NEUROLOGIC:  Cranial nerves 2-12 grossly intact. Moves all extremities  PSYCHIATRIC: Mood and affect intermittently pleasantly confused, no behavioral issues  Patient Active Problem List   Diagnosis Date Noted  . Acute respiratory failure with hypoxemia ([place])   . Acute pulmonary edema (HCC)   . Acute encephalopathy   . Palliative care by specialist   . Goals of care, counseling/discussion   . Hyperglycemia due to type 2 diabetes mellitus ([place]) 11/04/2017  . Encephalopathy acute 11/04/2017  . Shock circulatory ([place]) 11/03/2017  . Colitis, acute 10/31/2017  . ATN (acute tubular necrosis) ([place]) 10/31/2017  . Diarrhea 10/30/2017  . CKD (chronic kidney disease) stage 3, GFR 30-59 ml/min (HCC) 10/30/2017  . Pyelitis: Mild per CT 05/24/2017 05/25/2017  . Acidemia   . Abdominal pain 05/24/2017  . Vaginal candidiasis 05/24/2017  . Dermatitis associated with moisture 05/24/2017  . Ileus [facility]): Colonic 05/24/2017  . Acute lower UTI 05/23/2017  . Iron deficiency anemia 05/23/2017    . Pressure injury of buttock, stage 2 ([place]) 05/23/2017  . Acute on chronic anemia   . Hyperkalemia   . Hydronephrosis   . Acute kidney failure ([place]) 05/22/2017  . Metabolic acidosis, normal anion gap (NAG) 05/22/2017  . Hyperkalemia, diminished renal excretion 05/22/2017  . Rhinovirus infection   . AKI (acute kidney injury) (  [place])   . HCAP (healthcare-associated pneumonia) 01/12/2017  . ARF (acute renal failure) ([place]) 01/12/2017  . Shortness of breath   . Retinopathy due to secondary diabetes mellitus ([place])   . Pneumonia   . Obesity   . 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 Nephrolithiasis   . Hypertension   . Hyperlipemia   . Hepatitis   . Gout   . Goiter   . GERD (gastroesophageal reflux disease)   . Diverticulitis   . Diabetes ([place])   . Colon polyp   . Cellulitis and abscess of trunk   . Arthritis   . Chronic anemia   . Adrenal adenoma   . Morbid obesity ([place]) 08/13/2013  . Postoperative anemia due to acute blood loss 08/13/2013  . S/P right TKA 08/11/2013  . Preop cardiovascular exam 08/14/2012  . Chest pain 08/14/2012  . Suture granuloma 02/09/2012  . DYSPNEA ON EXERTION 10/04/2009  . HYPERLIPIDEMIA 04/15/2007  . OBESITY 04/15/2007  . ANEMIA, CHRONIC 04/15/2007  . ANXIETY 04/15/2007  . DEPRESSION 04/15/2007  . HTN (hypertension) 04/15/2007  . GERD 04/15/2007  . DEGENERATIVE JOINT DISEASE 04/15/2007  . HELICOBACTER PYLORI INFECTION, HX OF 04/15/2007  . COLONIC POLYPS 10/04/2006  . DIVERTICULOSIS, COLON 10/04/2006  . REFLUX ESOPHAGITIS 09-Sep-202003  . GASTRITIS, ACUTE 09-Sep-202003      Labs reviewed: Basic Metabolic Panel:    Component Value Date/Time   NA 140 11/16/2017 0328   K 4.5 11/16/2017 0328   CL 105 11/16/2017 0328   CO2 28 11/16/2017 0328   GLUCOSE 201 (H) 11/16/2017 0328   BUN 49 (H) 11/16/2017 0328   CREATININE 1.64 (H) 11/16/2017 0328   CREATININE 0.85 08/14/2012 0930   CALCIUM 8.7 (L) 11/16/2017 0328   PROT 6.3 (L)  11/10/2017 0540   ALBUMIN 2.8 (L) 11/16/2017 0328   AST 35 11/10/2017 0540   ALT 22 11/10/2017 0540   ALKPHOS 101 11/10/2017 0540   BILITOT 0.7 11/10/2017 0540   GFRNONAA 28 (L) 11/16/2017 0328   GFRNONAA 66 08/14/2012 0930   GFRAA 32 (L) 11/16/2017 0328   GFRAA 76 08/14/2012 0930    Recent Labs    11/14/17 1051 11/15/17 0426 11/16/17 0328  NA 138 138 140  K 4.6 4.9 4.5  CL 106 106 105  CO2 23 26 28   GLUCOSE 211* 199* 201*  BUN 53* 56* 49*  CREATININE 2.16* 2.00* 1.64*  CALCIUM 9.0 8.9 8.7*  MG 2.2 2.1 1.8  PHOS 4.0 3.8 4.0   Liver Function Tests: Recent Labs    10/30/17 0815 10/31/17 0631  11/10/17 0540  11/14/17 1051 11/15/17 0426 11/16/17 0328  AST 28 20  --  35  --   --   --   --   ALT 24 21  --  22  --   --   --   --   ALKPHOS 151* 132*  --  101  --   --   --   --   BILITOT 0.5 0.7  --  0.7  --   --   --   --   PROT 7.4 6.2*  --  6.3*  --   --   --   --   ALBUMIN 2.9* 2.5*   < > 2.7*  2.7*   < > 3.1* 2.8* 2.8*   < > = values in this interval not displayed.   Recent Labs    05/22/17 1704 05/23/17 0746 10/30/17 0815  LIPASE 76* 45 23  AMYLASE 184*  --   --    No results for input(s): AMMONIA in the last 8760 hours. CBC: Recent Labs    01/12/17 2018  10/30/17 0815  11/13/17 0320 11/14/17 1051 11/15/17 0426  WBC 7.2   < > 10.5   < > 7.4 4.5 4.4  NEUTROABS 4.0  --  8.3*  --   --   --   --   HGB 9.9*   < > 8.2*   < > 8.3* 8.3* 8.0*  HCT 31.1*   < > 27.2*   < > 28.7* 29.0* 27.5*  MCV 65.6*   < > 67.3*   < > 77.4* 76.3* 76.2*  PLT 171   < > 264   < > 136* 127* 126*   < > = values in this interval not displayed.   Lipid No results for input(s): CHOL, HDL, LDLCALC, TRIG in the last 8760 hours.  Cardiac Enzymes: Recent Labs    11/01/17 0904  CKTOTAL 247*   BNP: Recent Labs    01/12/17 2018 11/13/17 0320  BNP 83.7 810.9*   No results found for: [facility] Lab Results  Component Value Date   HGBA1C 6.7 (H) 05/24/2017   Lab Results    Component Value Date   TSH 2.271 06/25/2010   Lab Results  Component Value Date   [id number] 673 06/26/2010   Lab Results  Component Value Date   FOLATE >20.0 06/26/2010   Lab Results  Component Value Date   IRON 20 (L) 05/22/2017   TIBC 276 05/22/2017   FERRITIN 171 06/26/2010    Imaging and Procedures obtained prior to SNF admission: Ct Abdomen Pelvis Wo Contrast  Result Date: 10/30/2017 CLINICAL DATA:  Abdominal distension, diarrhea for 5 days, blood in diarrhea, unable to control bladder or bowel, history of adrenal adenoma, diabetes mellitus, hypertension, nephrolithiasis, renal failure EXAM: CT ABDOMEN AND PELVIS WITHOUT CONTRAST TECHNIQUE: Multidetector CT imaging of the abdomen and pelvis was performed following the standard protocol without IV contrast. Sagittal and coronal MPR images reconstructed from axial data set. Patient drank dilute oral contrast for exam COMPARISON:  05/24/2017 FINDINGS: Lower chest: Bibasilar atelectasis and peribronchial thickening Hepatobiliary: Gallbladder surgically absent. No focal hepatic abnormalities Pancreas: Pancreatic atrophy without mass Spleen: Normal appearance Adrenals/Urinary Tract: Low-attenuation LEFT adrenal mass 3.1 x 2.6 cm consistent with adenoma. RIGHT adrenal gland unremarkable. BILATERAL renal cortical atrophy. Exophytic intermediate attenuation mass lateral aspect of inferior RIGHT kidney, 3.7 x 3.6 cm image 56. Minimal prominence of LEFT renal pelvis and LEFT ureter in the pelvis without obstructing calculus. RIGHT ureter and bladder normal appearance. Stomach/Bowel: Appendix surgically absent by history. Scattered colonic diverticulosis. Wall thickening of sigmoid colon with mild pericolic infiltrative changes. Additional wall thickening at the rectosigmoid colon. Findings could represent acute diverticulitis or colitis. No abscess or extraluminal gas. Stomach and small bowel loops unremarkable. Vascular/Lymphatic: Atherosclerotic  calcifications aorta and iliac arteries. Coronary arterial calcifications. Aorta normal caliber. No adenopathy. Reproductive: Uterus surgically absent.  Normal appearing ovaries. Other: No free air or free fluid. Tiny LEFT parasagittal ventral hernia inferior to the umbilicus. Musculoskeletal: Demineralized with degenerative disc and facet disease changes lumbar spine. IMPRESSION: Diffuse colonic diverticulosis. Wall thickening of the rectosigmoid colon with mild pericolic infiltrative changes, question diverticulitis versus colitis. No evidence of perforation or abscess. LEFT adrenal adenoma 3.1 x 2.6 cm. Grossly unchanged appearance of an exophytic mass 3.7 x 3.6 cm at the lateral RIGHT kidney, question complicated cyst; lesion is unchanged since 05/24/2017 but minimally  larger than seen on 02/10/2015 when it measured 3.5 x 3.2 cm, recommended attention on follow-up imaging to establish long-term stability. Electronically Signed   By: Lavonia Dana M.D.   On: 10/30/2017 13:03   Dg Chest Port 1 View  Result Date: 10/31/2017 CLINICAL DATA:  84 year old female with shortness of breath. EXAM: PORTABLE CHEST 1 VIEW COMPARISON:  Portable chest 10/30/2017 and earlier. CT Abdomen and Pelvis 10/30/2017. FINDINGS: There did not appear to be overt pulmonary edema at the lung bases yesterday by CT. Lower lobe bronchiectasis and peribronchial thickening was noted. Continued low lung volumes stable appearance of the pulmonary interstitium today from May this year. Stable mild cardiomegaly. Visualized tracheal air column is within normal limits. No pneumothorax, pleural effusion or confluent opacity. Large body habitus. No acute osseous abnormality identified. IMPRESSION: Persistent low lung volumes. No focal pulmonary abnormality. Consider viral or atypical respiratory infection given the appearance of the lung bases on CT Abdomen and Pelvis yesterday. Electronically Signed   By: Genevie Ann M.D.   On: 10/31/2017 13:18   Dg  Chest Port 1 View  Result Date: 10/30/2017 CLINICAL DATA:  84 year old female with constipation and watery diarrhea. Initial encounter. EXAM: PORTABLE CHEST 1 VIEW COMPARISON:  05/27/2017. FINDINGS: Cardiomegaly. Asymmetric mild pulmonary edema superimposed upon chronic changes. Calcified aorta. IMPRESSION: 1. Asymmetric mild pulmonary edema. 2. Cardiomegaly. 3.  Aortic Atherosclerosis (ICD10-I70.0). Electronically Signed   By: Genia Del M.D.   On: 10/30/2017 15:06     Not all labs, radiology exams or other studies done during hospitalization come through on my EPIC note; however they are reviewed by me.    Assessment and Plan  Septic shock/C. difficile diarrhea/acute renal failure- with hypotension, briefly staying in the ICU requiring pressors with the renal failure requiring dialysis for 3 days; completely resolved after a "full course of antibiotics for C. Difficile SNF- admitted for OT/PT supportive care  Metabolic and toxic encephalopathy-improved with supportive care SNF- not back to baseline, continue supportive care  Diabetes mellitus type 2 SNF- continue Lantus insulin 10 units at bedtime and sensitive sliding scale insulin with with meals once  Hypertension SNF- controlled; continue Lasix 40 mg daily and metoprolol 12.5 twice daily  Hyperlipidemia SNF-not stated as uncontrolled; continue Zocor 80 mg daily    Time spent greater than 45 minutes;> 50% of time with patient was spent reviewing records, labs, tests and studies, counseling and developing plan of care  Webb Silversmith D. Sheppard Coil, MD

## 2017-11-22 ENCOUNTER — Non-Acute Institutional Stay (SKILLED_NURSING_FACILITY): Payer: Commercial Managed Care - HMO | Admitting: Internal Medicine

## 2017-11-22 ENCOUNTER — Encounter: Payer: Self-pay | Admitting: Internal Medicine

## 2017-11-22 DIAGNOSIS — Z7189 Other specified counseling: Secondary | ICD-10-CM | POA: Diagnosis not present

## 2017-11-22 DIAGNOSIS — Z8619 Personal history of other infectious and parasitic diseases: Secondary | ICD-10-CM | POA: Diagnosis not present

## 2017-11-22 NOTE — Progress Notes (Signed)
:   Location:  [facility] Room Number: 962X Place of Service:  SNF (31)   D. Sheppard Coil, MD  Patient Care Team: [name], MD as PCP - General (Endocrinology) Stanford Breed Denice Bors, MD as PCP - Cardiology (Cardiology)  Extended Emergency Contact Information Primary Emergency Contact: Berthold,Donald Address: [address] [place], [place] [address] Johnnette Litter of [place] Phone: [phone] Mobile Phone: [phone] Relation: Spouse Secondary Emergency Contact: Fredderick Phenix Mobile Phone: [phone] Relation: Daughter     Allergies: Penicillins  Chief Complaint  Patient presents with  . Acute Visit    Family Meeting    HPI: Patient is 84 y.o. female who is being seen today for family meeting.  Patient's husband is supposed to be here but he is a no-show.  Past Medical History:  Diagnosis Date  . Adrenal adenoma   . Anemia   . Arthritis    OA AND PAIN BOTH KNEES BUT RIGHT KNEE PAIN WORSE; SOME LOWER BACK PAIN  . Cellulitis and abscess of trunk   . Colon polyp    adenomatous  . Diabetes ([place])    type 2  PT IS ON ORAL MEDICATION AND INSULIN  . Diverticulitis   . GERD (gastroesophageal reflux disease)    NO MEDS FOR GERD  . Goiter   . Gout   . Hepatitis    YELLOW JAUNDICE WHEN A CHILD  . Hernia   . Hyperlipemia   . Hypertension   . Nephrolithiasis   . 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 Obesity   . Open wound of abdominal wall, anterior, without mention of complication   . Pneumonia    IN THE PAST  . Retinopathy due to secondary diabetes mellitus ([place])   . Shortness of breath    PT IS NOT ABLE TO BE ACTIVE BECAUSE OF KNEE PROBLEM; HAS ALWAYS HAD PROBLEM WITH BREATHING HEAVY - DOES GET SOB WITH EXERTION.  Marland Kitchen Thalassemia minor     Past Surgical History:  Procedure Laterality Date  . APPENDECTOMY     1993 PT HAD APPENDECTOMY AND ABDOMINAL SURGERY FRO DIVERTICULITS  . CARDIAC  CATHETERIZATION  2008   conclusion: smooth and normal coronary arteries, normal left ventricular systolic function  . CHOLECYSTECTOMY    . COLONOSCOPY  10/04/06  . CYSTOSCOPY WITH BIOPSY N/A 06/16/2016   Procedure: CYSTOSCOPY WITH  BLADDER BIOPSY AND FULGERATION 1CM;  Surgeon: Festus Aloe, MD;  Location: [facility];  Service: Urology;  Laterality: N/A;  . DIAGNOSTIC MAMMOGRAM  03/17/2011  . IR FLUORO GUIDE CV LINE RIGHT  11/03/2017  . IR US GUIDE VASC ACCESS RIGHT  11/03/2017  . ROTATOR CUFF REPAIR Left   . SIGMOID RESECTION / RECTOPEXY    . TONSILLECTOMY    . TOTAL ABDOMINAL HYSTERECTOMY    . TOTAL KNEE ARTHROPLASTY Right 08/11/2013   Procedure: RIGHT TOTAL KNEE ARTHROPLASTY;  Surgeon: Mauri Pole, MD;  Location: [facility];  Service: Orthopedics;  Laterality: Right;  . VENTRAL HERNIA REPAIR  2007    Allergies as of 11/22/2017      Reactions   Penicillins Other (See Comments)   Has patient had a PCN reaction causing immediate rash, facial/tongue/throat swelling, SOB or lightheadedness with hypotension: No Has patient had a PCN reaction causing severe rash involving mucus membranes or skin necrosis: No Has patient had a PCN reaction that required hospitalization: No Has patient had a PCN  reaction occurring within the last 10 years: Unknown If all of the above answers are "NO", then may proceed with Cephalosporin use.      Medication List        Accurate as of 11/22/17  3:47 PM. Always use your most recent med list.          feeding supplement (PRO-STAT SUGAR FREE 64) Liqd Take 30 mLs by mouth 2 (two) times daily between meals.   ferrous sulfate 325 (65 FE) MG tablet Take 1 tablet (325 mg total) by mouth 2 (two) times daily with a meal.   furosemide 20 MG tablet Commonly known as:  LASIX Take 2 tablets (40 mg total) by mouth daily.   guaiFENesin-dextromethorphan 100-10 MG/5ML syrup Commonly known as:  ROBITUSSIN DM Take 5 mLs by mouth every 4 (four) hours as needed for  cough.   insulin aspart 100 UNIT/ML injection Commonly known as:  novoLOG Inject 0-9 Units into the skin every 4 (four) hours.   insulin glargine 100 UNIT/ML injection Commonly known as:  LANTUS Inject 0.1 mLs (10 Units total) into the skin at bedtime.   ipratropium-albuterol 0.5-2.5 (3) MG/3ML Soln Commonly known as:  DUONEB Take 3 mLs by nebulization every 4 (four) hours as needed.   metoprolol tartrate 25 MG tablet Commonly known as:  LOPRESSOR Take 0.5 tablets (12.5 mg total) by mouth 2 (two) times daily.   multivitamin with minerals Tabs tablet Take 1 tablet by mouth daily.   PRESERVISION AREDS 2+MULTI VIT PO Take 1 capsule by mouth 2 (two) times daily.   QUEtiapine 25 MG tablet Commonly known as:  SEROQUEL Take 25 mg by mouth 2 (two) times daily.   ranitidine 150 MG/10ML syrup Commonly known as:  ZANTAC Take 5 mLs (75 mg total) by mouth 2 (two) times daily.   simvastatin 80 MG tablet Commonly known as:  ZOCOR Take 80 mg by mouth at bedtime.   sucralfate 1 g tablet Commonly known as:  CARAFATE Take 1 g by mouth 4 (four) times daily.       No orders of the defined types were placed in this encounter.   Immunization History  Administered Date(s) Administered  . Influenza Split 10/29/2016  . Pneumococcal Polysaccharide-23 09/09/2009    Social History   Tobacco Use  . Smoking status: Never Smoker  . Smokeless tobacco: Never Used  Substance Use Topics  . Alcohol use: No    Alcohol/week: 0.0 standard drinks    Family history is   Family History  Problem Relation Age of Onset  . Diabetes Father   . Hypertension Father   . Kidney disease Father   . Liver disease Mother   . Hypertension Unknown   . Rheum arthritis Sister   . Diverticulitis Sister   . Hypertension Sister   . Colon cancer Neg Hx   . Colon polyps Neg Hx   . Esophageal cancer Neg Hx   . Pancreatic cancer Neg Hx   . Stomach cancer Neg Hx       Review of Systems  DATA  OBTAINED: from patient, nurse GENERAL:  no fevers, fatigue, appetite changes SKIN: No itching, or rash EYES: No eye pain, redness, discharge EARS: No earache, tinnitus, change in hearing NOSE: No congestion, drainage or bleeding  MOUTH/THROAT: No mouth or tooth pain, No sore throat RESPIRATORY: No cough, wheezing, SOB CARDIAC: No chest pain, palpitations, lower extremity edema  GI: No abdominal pain, No N/V/D or constipation, No heartburn or reflux  GU: No  dysuria, frequency or urgency, or incontinence  MUSCULOSKELETAL: No unrelieved bone/joint pain NEUROLOGIC: No headache, dizziness or focal weakness PSYCHIATRIC: No c/o anxiety or sadness   Vitals:   11/22/17 1518  BP: 130/72  Pulse: 70  Resp: 18  Temp: 98 F (36.7 C)  SpO2: 96%    SpO2 Readings from Last 1 Encounters:  11/22/17 96%   Body mass index is 44.26 kg/m.     Physical Exam  GENERAL APPEARANCE: Alert, conversant,  No acute distress.  SKIN: No diaphoresis rash HEAD: Normocephalic, atraumatic  EYES: Conjunctiva/lids clear. Pupils round, reactive. EOMs intact.  EARS: External exam WNL, canals clear. Hearing grossly normal.  NOSE: No deformity or discharge.  MOUTH/THROAT: Lips w/o lesions  RESPIRATORY: Breathing is even, unlabored. Lung sounds are clear   CARDIOVASCULAR: Heart RRR no murmurs, rubs or gallops. No peripheral edema.   GASTROINTESTINAL: Abdomen is soft, non-tender, not distended w/ normal bowel sounds. GENITOURINARY: Bladder non tender, not distended  MUSCULOSKELETAL: No abnormal joints or musculature NEUROLOGIC:  Cranial nerves 2-12 grossly intact. Moves all extremities  PSYCHIATRIC: Mood and affect appropriate to situation much clearer today, no behavioral issues  Patient Active Problem List   Diagnosis Date Noted  . Acute respiratory failure with hypoxemia ([place])   . Acute pulmonary edema (HCC)   . Acute encephalopathy   . Palliative care by specialist   . Goals of care,  counseling/discussion   . Hyperglycemia due to type 2 diabetes mellitus ([place]) 11/04/2017  . Encephalopathy acute 11/04/2017  . Shock circulatory ([place]) 11/03/2017  . Colitis, acute 10/31/2017  . ATN (acute tubular necrosis) ([place]) 10/31/2017  . Diarrhea 10/30/2017  . CKD (chronic kidney disease) stage 3, GFR 30-59 ml/min (HCC) 10/30/2017  . Pyelitis: Mild per CT 05/24/2017 05/25/2017  . Acidemia   . Abdominal pain 05/24/2017  . Vaginal candidiasis 05/24/2017  . Dermatitis associated with moisture 05/24/2017  . Ileus [facility]): Colonic 05/24/2017  . Acute lower UTI 05/23/2017  . Iron deficiency anemia 05/23/2017  . Pressure injury of buttock, stage 2 ([place]) 05/23/2017  . Acute on chronic anemia   . Hyperkalemia   . Hydronephrosis   . Acute kidney failure ([place]) 05/22/2017  . Metabolic acidosis, normal anion gap (NAG) 05/22/2017  . Hyperkalemia, diminished renal excretion 05/22/2017  . Rhinovirus infection   . AKI (acute kidney injury) ([place])   . HCAP (healthcare-associated pneumonia) 01/12/2017  . ARF (acute renal failure) ([place]) 01/12/2017  . Shortness of breath   . Retinopathy due to secondary diabetes mellitus ([place])   . Pneumonia   . Obesity   . 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 Nephrolithiasis   . Hypertension   . Hyperlipemia   . Hepatitis   . Gout   . Goiter   . GERD (gastroesophageal reflux disease)   . Diverticulitis   . Diabetes ([place])   . Colon polyp   . Cellulitis and abscess of trunk   . Arthritis   . Chronic anemia   . Adrenal adenoma   . Morbid obesity ([place]) 08/13/2013  . Postoperative anemia due to acute blood loss 08/13/2013  . S/P right TKA 08/11/2013  . Preop cardiovascular exam 08/14/2012  . Chest pain 08/14/2012  . Suture granuloma 02/09/2012  . DYSPNEA ON EXERTION 10/04/2009  . HYPERLIPIDEMIA 04/15/2007  . OBESITY 04/15/2007  . ANEMIA, CHRONIC 04/15/2007  . ANXIETY 04/15/2007  . DEPRESSION 04/15/2007  . HTN (hypertension)  04/15/2007  . GERD 04/15/2007  . DEGENERATIVE JOINT DISEASE 04/15/2007  .  HELICOBACTER PYLORI INFECTION, HX OF 04/15/2007  . COLONIC POLYPS 10/04/2006  . DIVERTICULOSIS, COLON 10/04/2006  . REFLUX ESOPHAGITIS 08-30-2001  . GASTRITIS, ACUTE 08-30-2001      Labs reviewed: Basic Metabolic Panel:    Component Value Date/Time   NA 140 11/16/2017 0328   K 4.5 11/16/2017 0328   CL 105 11/16/2017 0328   CO2 28 11/16/2017 0328   GLUCOSE 201 (H) 11/16/2017 0328   BUN 49 (H) 11/16/2017 0328   CREATININE 1.64 (H) 11/16/2017 0328   CREATININE 0.85 08/14/2012 0930   CALCIUM 8.7 (L) 11/16/2017 0328   PROT 6.3 (L) 11/10/2017 0540   ALBUMIN 2.8 (L) 11/16/2017 0328   AST 35 11/10/2017 0540   ALT 22 11/10/2017 0540   ALKPHOS 101 11/10/2017 0540   BILITOT 0.7 11/10/2017 0540   GFRNONAA 28 (L) 11/16/2017 0328   GFRNONAA 66 08/14/2012 0930   GFRAA 32 (L) 11/16/2017 0328   GFRAA 76 08/14/2012 0930    Recent Labs    11/14/17 1051 11/15/17 0426 11/16/17 0328  NA 138 138 140  K 4.6 4.9 4.5  CL 106 106 105  CO2 23 26 28   GLUCOSE 211* 199* 201*  BUN 53* 56* 49*  CREATININE 2.16* 2.00* 1.64*  CALCIUM 9.0 8.9 8.7*  MG 2.2 2.1 1.8  PHOS 4.0 3.8 4.0   Liver Function Tests: Recent Labs    10/30/17 0815 10/31/17 0631  11/10/17 0540  11/14/17 1051 11/15/17 0426 11/16/17 0328  AST 28 20  --  35  --   --   --   --   ALT 24 21  --  22  --   --   --   --   ALKPHOS 151* 132*  --  101  --   --   --   --   BILITOT 0.5 0.7  --  0.7  --   --   --   --   PROT 7.4 6.2*  --  6.3*  --   --   --   --   ALBUMIN 2.9* 2.5*   < > 2.7*  2.7*   < > 3.1* 2.8* 2.8*   < > = values in this interval not displayed.   Recent Labs    05/22/17 1704 05/23/17 0746 10/30/17 0815  LIPASE 76* 45 23  AMYLASE 184*  --   --    No results for input(s): AMMONIA in the last 8760 hours. CBC: Recent Labs    01/12/17 2018  10/30/17 0815  11/13/17 0320 11/14/17 1051 11/15/17 0426  WBC 7.2   < > 10.5   < >  7.4 4.5 4.4  NEUTROABS 4.0  --  8.3*  --   --   --   --   HGB 9.9*   < > 8.2*   < > 8.3* 8.3* 8.0*  HCT 31.1*   < > 27.2*   < > 28.7* 29.0* 27.5*  MCV 65.6*   < > 67.3*   < > 77.4* 76.3* 76.2*  PLT 171   < > 264   < > 136* 127* 126*   < > = values in this interval not displayed.   Lipid No results for input(s): CHOL, HDL, LDLCALC, TRIG in the last 8760 hours.  Cardiac Enzymes: Recent Labs    11/01/17 0904  CKTOTAL 247*   BNP: Recent Labs    01/12/17 2018 11/13/17 0320  BNP 83.7 810.9*   No results found for: [facility] Lab Results  Component  Value Date   HGBA1C 6.7 (H) 05/24/2017   Lab Results  Component Value Date   TSH 2.271 06/25/2010   Lab Results  Component Value Date   AOZHYQMV78 469 06/26/2010   Lab Results  Component Value Date   FOLATE >20.0 06/26/2010   Lab Results  Component Value Date   IRON 20 (L) 05/22/2017   TIBC 276 05/22/2017   FERRITIN 171 06/26/2010    Imaging and Procedures obtained prior to SNF admission: Ct Abdomen Pelvis Wo Contrast  Result Date: 10/30/2017 CLINICAL DATA:  Abdominal distension, diarrhea for 5 days, blood in diarrhea, unable to control bladder or bowel, history of adrenal adenoma, diabetes mellitus, hypertension, nephrolithiasis, renal failure EXAM: CT ABDOMEN AND PELVIS WITHOUT CONTRAST TECHNIQUE: Multidetector CT imaging of the abdomen and pelvis was performed following the standard protocol without IV contrast. Sagittal and coronal MPR images reconstructed from axial data set. Patient drank dilute oral contrast for exam COMPARISON:  05/24/2017 FINDINGS: Lower chest: Bibasilar atelectasis and peribronchial thickening Hepatobiliary: Gallbladder surgically absent. No focal hepatic abnormalities Pancreas: Pancreatic atrophy without mass Spleen: Normal appearance Adrenals/Urinary Tract: Low-attenuation LEFT adrenal mass 3.1 x 2.6 cm consistent with adenoma. RIGHT adrenal gland unremarkable. BILATERAL renal cortical atrophy.  Exophytic intermediate attenuation mass lateral aspect of inferior RIGHT kidney, 3.7 x 3.6 cm image 56. Minimal prominence of LEFT renal pelvis and LEFT ureter in the pelvis without obstructing calculus. RIGHT ureter and bladder normal appearance. Stomach/Bowel: Appendix surgically absent by history. Scattered colonic diverticulosis. Wall thickening of sigmoid colon with mild pericolic infiltrative changes. Additional wall thickening at the rectosigmoid colon. Findings could represent acute diverticulitis or colitis. No abscess or extraluminal gas. Stomach and small bowel loops unremarkable. Vascular/Lymphatic: Atherosclerotic calcifications aorta and iliac arteries. Coronary arterial calcifications. Aorta normal caliber. No adenopathy. Reproductive: Uterus surgically absent.  Normal appearing ovaries. Other: No free air or free fluid. Tiny LEFT parasagittal ventral hernia inferior to the umbilicus. Musculoskeletal: Demineralized with degenerative disc and facet disease changes lumbar spine. IMPRESSION: Diffuse colonic diverticulosis. Wall thickening of the rectosigmoid colon with mild pericolic infiltrative changes, question diverticulitis versus colitis. No evidence of perforation or abscess. LEFT adrenal adenoma 3.1 x 2.6 cm. Grossly unchanged appearance of an exophytic mass 3.7 x 3.6 cm at the lateral RIGHT kidney, question complicated cyst; lesion is unchanged since 05/24/2017 but minimally larger than seen on 02/10/2015 when it measured 3.5 x 3.2 cm, recommended attention on follow-up imaging to establish long-term stability. Electronically Signed   By: Lavonia Dana M.D.   On: 10/30/2017 13:03   Dg Chest Port 1 View  Result Date: 10/31/2017 CLINICAL DATA:  84 year old female with shortness of breath. EXAM: PORTABLE CHEST 1 VIEW COMPARISON:  Portable chest 10/30/2017 and earlier. CT Abdomen and Pelvis 10/30/2017. FINDINGS: There did not appear to be overt pulmonary edema at the lung bases yesterday by CT.  Lower lobe bronchiectasis and peribronchial thickening was noted. Continued low lung volumes stable appearance of the pulmonary interstitium today from May this year. Stable mild cardiomegaly. Visualized tracheal air column is within normal limits. No pneumothorax, pleural effusion or confluent opacity. Large body habitus. No acute osseous abnormality identified. IMPRESSION: Persistent low lung volumes. No focal pulmonary abnormality. Consider viral or atypical respiratory infection given the appearance of the lung bases on CT Abdomen and Pelvis yesterday. Electronically Signed   By: Genevie Ann M.D.   On: 10/31/2017 13:18   Dg Chest Port 1 View  Result Date: 10/30/2017 CLINICAL DATA:  84 year old female with constipation and watery  diarrhea. Initial encounter. EXAM: PORTABLE CHEST 1 VIEW COMPARISON:  05/27/2017. FINDINGS: Cardiomegaly. Asymmetric mild pulmonary edema superimposed upon chronic changes. Calcified aorta. IMPRESSION: 1. Asymmetric mild pulmonary edema. 2. Cardiomegaly. 3.  Aortic Atherosclerosis (ICD10-I70.0). Electronically Signed   By: Genia Del M.D.   On: 10/30/2017 15:06     Not all labs, radiology exams or other studies done during hospitalization come through on my EPIC note; however they are reviewed by me.    Assessment and Plan  Status post sepsis/encounter for family conference with patient present- the family was not present but we had the conference in any case; patient had only one question for me and that is what happened to her in the hospital which I explained her at length and why she was feeling so weak and that she was starting to do much better; social worker spoke with her but issues of importance and patient seemed to understand; we told her to have her husband follow-up with [place]   Spent greater than 25 minutes Noah Delaine. Sheppard Coil, MD

## 2017-11-23 ENCOUNTER — Encounter: Payer: Self-pay | Admitting: Internal Medicine

## 2017-11-23 DIAGNOSIS — A419 Sepsis, unspecified organism: Secondary | ICD-10-CM | POA: Insufficient documentation

## 2017-11-23 DIAGNOSIS — A0472 Enterocolitis due to Clostridium difficile, not specified as recurrent: Secondary | ICD-10-CM | POA: Insufficient documentation

## 2017-11-25 ENCOUNTER — Encounter: Payer: Self-pay | Admitting: Internal Medicine

## 2017-11-26 ENCOUNTER — Emergency Department (HOSPITAL_COMMUNITY): Payer: Medicare HMO

## 2017-11-26 ENCOUNTER — Inpatient Hospital Stay (HOSPITAL_COMMUNITY)
Admission: EM | Admit: 2017-11-26 | Discharge: 2017-12-06 | DRG: 871 | Disposition: A | Discharge status: Home Health | Payer: Medicare HMO | Attending: Internal Medicine | Admitting: Internal Medicine

## 2017-11-26 DIAGNOSIS — G9341 Metabolic encephalopathy: Secondary | ICD-10-CM | POA: Diagnosis present

## 2017-11-26 DIAGNOSIS — E669 Obesity, unspecified: Secondary | ICD-10-CM | POA: Diagnosis present

## 2017-11-26 DIAGNOSIS — Z841 Family history of disorders of kidney and ureter: Secondary | ICD-10-CM

## 2017-11-26 DIAGNOSIS — N184 Chronic kidney disease, stage 4 (severe): Secondary | ICD-10-CM | POA: Diagnosis present

## 2017-11-26 DIAGNOSIS — E876 Hypokalemia: Secondary | ICD-10-CM | POA: Diagnosis not present

## 2017-11-26 DIAGNOSIS — N170 Acute kidney failure with tubular necrosis: Secondary | ICD-10-CM | POA: Diagnosis not present

## 2017-11-26 DIAGNOSIS — Z8249 Family history of ischemic heart disease and other diseases of the circulatory system: Secondary | ICD-10-CM

## 2017-11-26 DIAGNOSIS — R0602 Shortness of breath: Secondary | ICD-10-CM

## 2017-11-26 DIAGNOSIS — K219 Gastro-esophageal reflux disease without esophagitis: Secondary | ICD-10-CM | POA: Diagnosis not present

## 2017-11-26 DIAGNOSIS — D509 Iron deficiency anemia, unspecified: Secondary | ICD-10-CM | POA: Diagnosis present

## 2017-11-26 DIAGNOSIS — Z66 Do not resuscitate: Secondary | ICD-10-CM | POA: Diagnosis present

## 2017-11-26 DIAGNOSIS — E1129 Type 2 diabetes mellitus with other diabetic kidney complication: Secondary | ICD-10-CM | POA: Diagnosis present

## 2017-11-26 DIAGNOSIS — I130 Hypertensive heart and chronic kidney disease with heart failure and stage 1 through stage 4 chronic kidney disease, or unspecified chronic kidney disease: Secondary | ICD-10-CM | POA: Diagnosis present

## 2017-11-26 DIAGNOSIS — Z8261 Family history of arthritis: Secondary | ICD-10-CM

## 2017-11-26 DIAGNOSIS — Z515 Encounter for palliative care: Secondary | ICD-10-CM | POA: Diagnosis not present

## 2017-11-26 DIAGNOSIS — F329 Major depressive disorder, single episode, unspecified: Secondary | ICD-10-CM | POA: Diagnosis present

## 2017-11-26 DIAGNOSIS — J9602 Acute respiratory failure with hypercapnia: Secondary | ICD-10-CM | POA: Diagnosis not present

## 2017-11-26 DIAGNOSIS — E1122 Type 2 diabetes mellitus with diabetic chronic kidney disease: Secondary | ICD-10-CM | POA: Diagnosis present

## 2017-11-26 DIAGNOSIS — N132 Hydronephrosis with renal and ureteral calculous obstruction: Secondary | ICD-10-CM | POA: Diagnosis present

## 2017-11-26 DIAGNOSIS — R0902 Hypoxemia: Secondary | ICD-10-CM

## 2017-11-26 DIAGNOSIS — R2689 Other abnormalities of gait and mobility: Secondary | ICD-10-CM | POA: Diagnosis not present

## 2017-11-26 DIAGNOSIS — Z9981 Dependence on supplemental oxygen: Secondary | ICD-10-CM

## 2017-11-26 DIAGNOSIS — D631 Anemia in chronic kidney disease: Secondary | ICD-10-CM | POA: Diagnosis present

## 2017-11-26 DIAGNOSIS — G4089 Other seizures: Secondary | ICD-10-CM | POA: Diagnosis not present

## 2017-11-26 DIAGNOSIS — T502X5A Adverse effect of carbonic-anhydrase inhibitors, benzothiadiazides and other diuretics, initial encounter: Secondary | ICD-10-CM | POA: Diagnosis present

## 2017-11-26 DIAGNOSIS — I10 Essential (primary) hypertension: Secondary | ICD-10-CM | POA: Diagnosis present

## 2017-11-26 DIAGNOSIS — K5649 Other impaction of intestine: Secondary | ICD-10-CM

## 2017-11-26 DIAGNOSIS — D563 Thalassemia minor: Secondary | ICD-10-CM | POA: Diagnosis present

## 2017-11-26 DIAGNOSIS — Z7189 Other specified counseling: Secondary | ICD-10-CM | POA: Diagnosis not present

## 2017-11-26 DIAGNOSIS — E1165 Type 2 diabetes mellitus with hyperglycemia: Secondary | ICD-10-CM | POA: Diagnosis present

## 2017-11-26 DIAGNOSIS — I5033 Acute on chronic diastolic (congestive) heart failure: Secondary | ICD-10-CM | POA: Diagnosis present

## 2017-11-26 DIAGNOSIS — A419 Sepsis, unspecified organism: Principal | ICD-10-CM | POA: Diagnosis present

## 2017-11-26 DIAGNOSIS — A0472 Enterocolitis due to Clostridium difficile, not specified as recurrent: Secondary | ICD-10-CM | POA: Diagnosis present

## 2017-11-26 DIAGNOSIS — L8990 Pressure ulcer of unspecified site, unspecified stage: Secondary | ICD-10-CM | POA: Diagnosis not present

## 2017-11-26 DIAGNOSIS — R4182 Altered mental status, unspecified: Secondary | ICD-10-CM | POA: Diagnosis not present

## 2017-11-26 DIAGNOSIS — R109 Unspecified abdominal pain: Secondary | ICD-10-CM | POA: Diagnosis not present

## 2017-11-26 DIAGNOSIS — R279 Unspecified lack of coordination: Secondary | ICD-10-CM | POA: Diagnosis not present

## 2017-11-26 DIAGNOSIS — D649 Anemia, unspecified: Secondary | ICD-10-CM | POA: Diagnosis not present

## 2017-11-26 DIAGNOSIS — N1339 Other hydronephrosis: Secondary | ICD-10-CM | POA: Diagnosis not present

## 2017-11-26 DIAGNOSIS — I5030 Unspecified diastolic (congestive) heart failure: Secondary | ICD-10-CM | POA: Diagnosis not present

## 2017-11-26 DIAGNOSIS — E11319 Type 2 diabetes mellitus with unspecified diabetic retinopathy without macular edema: Secondary | ICD-10-CM | POA: Diagnosis present

## 2017-11-26 DIAGNOSIS — Z743 Need for continuous supervision: Secondary | ICD-10-CM | POA: Diagnosis not present

## 2017-11-26 DIAGNOSIS — Z794 Long term (current) use of insulin: Secondary | ICD-10-CM | POA: Diagnosis not present

## 2017-11-26 DIAGNOSIS — I959 Hypotension, unspecified: Secondary | ICD-10-CM | POA: Diagnosis not present

## 2017-11-26 DIAGNOSIS — D3500 Benign neoplasm of unspecified adrenal gland: Secondary | ICD-10-CM | POA: Diagnosis present

## 2017-11-26 DIAGNOSIS — Z880 Allergy status to penicillin: Secondary | ICD-10-CM

## 2017-11-26 DIAGNOSIS — R0989 Other specified symptoms and signs involving the circulatory and respiratory systems: Secondary | ICD-10-CM | POA: Diagnosis not present

## 2017-11-26 DIAGNOSIS — R600 Localized edema: Secondary | ICD-10-CM | POA: Diagnosis not present

## 2017-11-26 DIAGNOSIS — Z6841 Body Mass Index (BMI) 40.0 and over, adult: Secondary | ICD-10-CM | POA: Diagnosis not present

## 2017-11-26 DIAGNOSIS — F32A Depression, unspecified: Secondary | ICD-10-CM | POA: Diagnosis present

## 2017-11-26 DIAGNOSIS — E785 Hyperlipidemia, unspecified: Secondary | ICD-10-CM | POA: Diagnosis present

## 2017-11-26 DIAGNOSIS — L89159 Pressure ulcer of sacral region, unspecified stage: Secondary | ICD-10-CM | POA: Diagnosis present

## 2017-11-26 DIAGNOSIS — J9601 Acute respiratory failure with hypoxia: Secondary | ICD-10-CM | POA: Diagnosis not present

## 2017-11-26 DIAGNOSIS — R404 Transient alteration of awareness: Secondary | ICD-10-CM | POA: Diagnosis not present

## 2017-11-26 DIAGNOSIS — J9611 Chronic respiratory failure with hypoxia: Secondary | ICD-10-CM | POA: Diagnosis not present

## 2017-11-26 DIAGNOSIS — R0600 Dyspnea, unspecified: Secondary | ICD-10-CM

## 2017-11-26 DIAGNOSIS — E870 Hyperosmolality and hypernatremia: Secondary | ICD-10-CM | POA: Diagnosis not present

## 2017-11-26 DIAGNOSIS — Z833 Family history of diabetes mellitus: Secondary | ICD-10-CM

## 2017-11-26 DIAGNOSIS — R569 Unspecified convulsions: Secondary | ICD-10-CM | POA: Diagnosis not present

## 2017-11-26 DIAGNOSIS — J811 Chronic pulmonary edema: Secondary | ICD-10-CM | POA: Diagnosis not present

## 2017-11-26 DIAGNOSIS — J189 Pneumonia, unspecified organism: Secondary | ICD-10-CM | POA: Diagnosis not present

## 2017-11-26 DIAGNOSIS — R0603 Acute respiratory distress: Secondary | ICD-10-CM | POA: Diagnosis not present

## 2017-11-26 DIAGNOSIS — R6521 Severe sepsis with septic shock: Secondary | ICD-10-CM | POA: Diagnosis not present

## 2017-11-26 LAB — COMPREHENSIVE METABOLIC PANEL
ALK PHOS: 137 U/L — AB (ref 38–126)
ALT: 21 U/L (ref 0–44)
AST: 29 U/L (ref 15–41)
Albumin: 3.6 g/dL (ref 3.5–5.0)
Anion gap: 12 (ref 5–15)
BUN: 27 mg/dL — AB (ref 8–23)
CALCIUM: 9.3 mg/dL (ref 8.9–10.3)
CO2: 30 mmol/L (ref 22–32)
CREATININE: 1.43 mg/dL — AB (ref 0.44–1.00)
Chloride: 96 mmol/L — ABNORMAL LOW (ref 98–111)
GFR, EST AFRICAN AMERICAN: 38 mL/min — AB (ref >60)
GFR, EST NON AFRICAN AMERICAN: 33 mL/min — AB (ref >60)
Glucose, Bld: 300 mg/dL — ABNORMAL HIGH (ref 70–99)
Potassium: 4.2 mmol/L (ref 3.5–5.1)
Sodium: 138 mmol/L (ref 135–145)
Total Bilirubin: 1 mg/dL (ref 0.3–1.2)
Total Protein: 8.1 g/dL (ref 6.5–8.1)

## 2017-11-26 LAB — URINALYSIS, ROUTINE W REFLEX MICROSCOPIC
Bacteria, UA: NONE SEEN
Bilirubin Urine: NEGATIVE
Glucose, UA: 150 mg/dL — AB
Hgb urine dipstick: NEGATIVE
KETONES UR: 5 mg/dL — AB
Leukocytes, UA: NEGATIVE
NITRITE: NEGATIVE
Protein, ur: >=300 mg/dL — AB
Specific Gravity, Urine: 1.012 (ref 1.005–1.030)
pH: 7 (ref 5.0–8.0)

## 2017-11-26 LAB — CBC WITH DIFFERENTIAL/PLATELET
Abs Immature Granulocytes: 0.05 10*3/uL (ref 0.00–0.07)
BASOS ABS: 0 10*3/uL (ref 0.0–0.1)
Basophils Relative: 0 %
EOS ABS: 0 10*3/uL (ref 0.0–0.5)
EOS PCT: 0 %
HEMATOCRIT: 33.4 % — AB (ref 36.0–46.0)
Hemoglobin: 10.2 g/dL — ABNORMAL LOW (ref 12.0–15.0)
Immature Granulocytes: 1 %
LYMPHS ABS: 0.6 10*3/uL — AB (ref 0.7–4.0)
Lymphocytes Relative: 9 %
MCH: 22.3 pg — AB (ref 26.0–34.0)
MCHC: 30.5 g/dL (ref 30.0–36.0)
MCV: 73.1 fL — AB (ref 80.0–100.0)
MONO ABS: 0.2 10*3/uL (ref 0.1–1.0)
Monocytes Relative: 2 %
NRBC: 0.3 % — AB (ref 0.0–0.2)
Neutro Abs: 6.3 10*3/uL (ref 1.7–7.7)
Neutrophils Relative %: 88 %
Platelets: 160 10*3/uL (ref 150–400)
RBC: 4.57 MIL/uL (ref 3.87–5.11)
RDW: 20 % — AB (ref 11.5–15.5)
WBC: 7.2 10*3/uL (ref 4.0–10.5)

## 2017-11-26 LAB — I-STAT VENOUS BLOOD GAS, ED
Acid-Base Excess: 12 mmol/L — ABNORMAL HIGH (ref 0.0–2.0)
BICARBONATE: 36.8 mmol/L — AB (ref 20.0–28.0)
O2 Saturation: 88 %
PH VEN: 7.512 — AB (ref 7.250–7.430)
PO2 VEN: 50 mmHg — AB (ref 32.0–45.0)
TCO2: 38 mmol/L — ABNORMAL HIGH (ref 22–32)
pCO2, Ven: 45.9 mmHg (ref 44.0–60.0)

## 2017-11-26 LAB — I-STAT CG4 LACTIC ACID, ED: LACTIC ACID, VENOUS: 1.65 mmol/L (ref 0.5–1.9)

## 2017-11-26 MED ORDER — SODIUM CHLORIDE 0.9 % IV BOLUS (SEPSIS)
600.0000 mL | Freq: Once | INTRAVENOUS | Status: AC
  Administered 2017-11-27: 600 mL via INTRAVENOUS

## 2017-11-26 MED ORDER — ACETAMINOPHEN 650 MG RE SUPP
975.0000 mg | Freq: Once | RECTAL | Status: AC
  Administered 2017-11-26: 975 mg via RECTAL
  Filled 2017-11-26: qty 1

## 2017-11-26 MED ORDER — SODIUM CHLORIDE 0.9 % IV BOLUS (SEPSIS)
1000.0000 mL | Freq: Once | INTRAVENOUS | Status: AC
  Administered 2017-11-26: 1000 mL via INTRAVENOUS

## 2017-11-26 MED ORDER — SODIUM CHLORIDE 0.9 % IV SOLN
2.0000 g | INTRAVENOUS | Status: DC
  Administered 2017-11-26: 2 g via INTRAVENOUS
  Filled 2017-11-26: qty 20

## 2017-11-26 NOTE — ED Triage Notes (Signed)
Pt to ED via EMS from [facility]. LKW 1400. When oncoming nurse took report at 1500 she noticed her to not be responding as usual or is opening her eyes. Facility did not call EMS until 2000 because they "did not have time to call 911 before that." Pt warm to touch. Pt moaning, which per EMS is baseline for her.

## 2017-11-26 NOTE — ED Notes (Signed)
Attempted 2nd set of blood cultures without success.

## 2017-11-26 NOTE — ED Provider Notes (Signed)
[facility] EMERGENCY DEPARTMENT Provider Note   CSN: [id number] Arrival date & time: 11/26/17  2056     History   Chief Complaint Chief Complaint  Patient presents with  . Altered Mental Status    HPI Lauren Crosby is a 84 y.o. female.  HPI   Patient is here for evaluation of altered mental status, noticed today at her nursing care facility.  She was sent here for rehab after a recent hospitalization during which time she had acute kidney injury requiring dialysis, for period time.  According to her husband who gives the history, her kidneys got back to normal, after the dialysis treatment.  For the last several days the patient has been "groaning,", and see me need to be in pain.  Today she was less responsive than usual so she was sent here for evaluation.  Patient is unable to give any history.  Patient was made no CODE BLUE, during her recent hospitalization.  L5 caveat-altered mental status.  Past Medical History:  Diagnosis Date  . Adrenal adenoma   . Anemia   . Arthritis    OA AND PAIN BOTH KNEES BUT RIGHT KNEE PAIN WORSE; SOME LOWER BACK PAIN  . Cellulitis and abscess of trunk   . Colon polyp    adenomatous  . Diabetes ([place])    type 2  PT IS ON ORAL MEDICATION AND INSULIN  . Diverticulitis   . GERD (gastroesophageal reflux disease)    NO MEDS FOR GERD  . Goiter   . Gout   . Hepatitis    YELLOW JAUNDICE WHEN A CHILD  . Hernia   . Hyperlipemia   . Hypertension   . Nephrolithiasis   . 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 Obesity   . Open wound of abdominal wall, anterior, without mention of complication   . Pneumonia    IN THE PAST  . Retinopathy due to secondary diabetes mellitus ([place])   . Shortness of breath    PT IS NOT ABLE TO BE ACTIVE BECAUSE OF KNEE PROBLEM; HAS ALWAYS HAD PROBLEM WITH BREATHING HEAVY - DOES GET SOB WITH EXERTION.  Marland Kitchen Thalassemia minor     Patient Active Problem List   Diagnosis Date  Noted  . Sepsis ([place]) 11/23/2017  . C. difficile diarrhea 11/23/2017  . Acute respiratory failure with hypoxemia ([place])   . Acute pulmonary edema (HCC)   . Acute encephalopathy   . Palliative care by specialist   . Goals of care, counseling/discussion   . Hyperglycemia due to type 2 diabetes mellitus ([place]) 11/04/2017  . Encephalopathy acute 11/04/2017  . Shock circulatory ([place]) 11/03/2017  . Colitis, acute 10/31/2017  . ATN (acute tubular necrosis) ([place]) 10/31/2017  . Diarrhea 10/30/2017  . CKD (chronic kidney disease) stage 3, GFR 30-59 ml/min (HCC) 10/30/2017  . Pyelitis: Mild per CT 05/24/2017 05/25/2017  . Acidemia   . Abdominal pain 05/24/2017  . Vaginal candidiasis 05/24/2017  . Dermatitis associated with moisture 05/24/2017  . Ileus [facility]): Colonic 05/24/2017  . Acute lower UTI 05/23/2017  . Iron deficiency anemia 05/23/2017  . Pressure injury of buttock, stage 2 ([place]) 05/23/2017  . Acute on chronic anemia   . Hyperkalemia   . Hydronephrosis   . Acute kidney failure ([place]) 05/22/2017  . Metabolic acidosis, normal anion gap (NAG) 05/22/2017  . Hyperkalemia, diminished renal excretion 05/22/2017  . Rhinovirus infection   . AKI (acute kidney injury) ([place])   . HCAP (healthcare-associated pneumonia)  01/12/2017  . ARF (acute renal failure) ([place]) 01/12/2017  . Shortness of breath   . Retinopathy due to secondary diabetes mellitus ([place])   . Pneumonia   . Obesity   . 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 Nephrolithiasis   . Hypertension   . Hyperlipemia   . Hepatitis   . Gout   . Goiter   . GERD (gastroesophageal reflux disease)   . Diverticulitis   . Diabetes ([place])   . Colon polyp   . Cellulitis and abscess of trunk   . Arthritis   . Chronic anemia   . Adrenal adenoma   . Morbid obesity ([place]) 08/13/2013  . Postoperative anemia due to acute blood loss 08/13/2013  . S/P right TKA 08/11/2013  . Preop cardiovascular exam 08/14/2012  . Chest pain  08/14/2012  . Suture granuloma 02/09/2012  . DYSPNEA ON EXERTION 10/04/2009  . Hyperlipidemia associated with type 2 diabetes mellitus ([place]) 04/15/2007  . OBESITY 04/15/2007  . ANEMIA, CHRONIC 04/15/2007  . ANXIETY 04/15/2007  . DEPRESSION 04/15/2007  . HTN (hypertension) 04/15/2007  . GERD 04/15/2007  . DEGENERATIVE JOINT DISEASE 04/15/2007  . HELICOBACTER PYLORI INFECTION, HX OF 04/15/2007  . COLONIC POLYPS 10/04/2006  . DIVERTICULOSIS, COLON 10/04/2006  . REFLUX ESOPHAGITIS 10-28-202003  . GASTRITIS, ACUTE 10-28-202003    Past Surgical History:  Procedure Laterality Date  . APPENDECTOMY     1993 PT HAD APPENDECTOMY AND ABDOMINAL SURGERY FRO DIVERTICULITS  . CARDIAC CATHETERIZATION  2008   conclusion: smooth and normal coronary arteries, normal left ventricular systolic function  . CHOLECYSTECTOMY    . COLONOSCOPY  10/04/06  . CYSTOSCOPY WITH BIOPSY N/A 06/16/2016   Procedure: CYSTOSCOPY WITH  BLADDER BIOPSY AND FULGERATION 1CM;  Surgeon: Festus Aloe, MD;  Location: [facility];  Service: Urology;  Laterality: N/A;  . DIAGNOSTIC MAMMOGRAM  03/17/2011  . IR FLUORO GUIDE CV LINE RIGHT  11/03/2017  . IR US GUIDE VASC ACCESS RIGHT  11/03/2017  . ROTATOR CUFF REPAIR Left   . SIGMOID RESECTION / RECTOPEXY    . TONSILLECTOMY    . TOTAL ABDOMINAL HYSTERECTOMY    . TOTAL KNEE ARTHROPLASTY Right 08/11/2013   Procedure: RIGHT TOTAL KNEE ARTHROPLASTY;  Surgeon: Mauri Pole, MD;  Location: [facility];  Service: Orthopedics;  Laterality: Right;  . VENTRAL HERNIA REPAIR  2007     OB History    Gravida  7   Para  6   Term  6   Preterm      AB  1   Living  6     SAB  1   TAB      Ectopic      Multiple      Live Births  6            Home Medications    Prior to Admission medications   Medication Sig Start Date End Date Taking? Authorizing Provider  Amino Acids-Protein Hydrolys (FEEDING SUPPLEMENT, PRO-STAT SUGAR FREE 64,) LIQD Take 30 mLs by mouth 2 (two) times daily  between meals. 11/16/17  Yes Elgergawy, Silver Huguenin, MD  ferrous sulfate 325 (65 FE) MG tablet Take 1 tablet (325 mg total) by mouth 2 (two) times daily with a meal. 11/16/17  Yes Elgergawy, Silver Huguenin, MD  furosemide (LASIX) 40 MG tablet Take 40 mg by mouth daily.   Yes [provider]  guaiFENesin-dextromethorphan (ROBITUSSIN DM) 100-10 MG/5ML syrup Take 5 mLs by mouth every 4 (four) hours as needed  for cough. 01/14/17  Yes Rosita Fire, MD  insulin aspart (NOVOLOG) 100 UNIT/ML injection Inject 0-9 Units into the skin every 4 (four) hours. Patient taking differently: Inject 1-9 Units into the skin See admin instructions. Inject 1-9 units subcutaneously four times daily - before meals and at bedtime - per sliding scale: CBG 121-150 1 unit, 151-200 2 units, 201-250 3 units, 251-300 5 units, 301-350 7 units, 351-400 9 units, >400 9 units and call MD 11/16/17  Yes Elgergawy, Silver Huguenin, MD  insulin glargine (LANTUS) 100 UNIT/ML injection Inject 0.1 mLs (10 Units total) into the skin at bedtime. 11/16/17  Yes Elgergawy, Silver Huguenin, MD  ipratropium-albuterol (DUONEB) 0.5-2.5 (3) MG/3ML SOLN Take 3 mLs by nebulization every 4 (four) hours as needed. Patient taking differently: Take 3 mLs by nebulization every 4 (four) hours as needed (shortness of breath/wheezing).  11/16/17  Yes Elgergawy, Silver Huguenin, MD  metoprolol tartrate (LOPRESSOR) 25 MG tablet Take 0.5 tablets (12.5 mg total) by mouth 2 (two) times daily. 11/16/17  Yes Elgergawy, Silver Huguenin, MD  Multiple Vitamin (MULTIVITAMIN WITH MINERALS) TABS tablet Take 1 tablet by mouth daily.   Yes [provider]  Multiple Vitamins-Minerals (PRESERVISION AREDS 2) CAPS Take 1 capsule by mouth daily.   Yes [provider]  OXYGEN Inhale 2 L into the lungs continuous.   Yes [provider]  QUEtiapine (SEROQUEL) 25 MG tablet Take 25 mg by mouth 2 (two) times daily.   Yes [provider]  ranitidine (ZANTAC) 150 MG/10ML syrup Take  5 mLs (75 mg total) by mouth 2 (two) times daily. 11/16/17  Yes Elgergawy, Silver Huguenin, MD  simvastatin (ZOCOR) 80 MG tablet Take 80 mg by mouth at bedtime.    Yes [provider]  sucralfate (CARAFATE) 1 g tablet Take 1 g by mouth 4 (four) times daily.   Yes [provider]  furosemide (LASIX) 20 MG tablet Take 2 tablets (40 mg total) by mouth daily. Patient not taking: Reported on 11/26/2017 11/16/17   Elgergawy, Silver Huguenin, MD    Family History Family History  Problem Relation Age of Onset  . Diabetes Father   . Hypertension Father   . Kidney disease Father   . Liver disease Mother   . Hypertension Unknown   . Rheum arthritis Sister   . Diverticulitis Sister   . Hypertension Sister   . Colon cancer Neg Hx   . Colon polyps Neg Hx   . Esophageal cancer Neg Hx   . Pancreatic cancer Neg Hx   . Stomach cancer Neg Hx     Social History Social History   Tobacco Use  . Smoking status: Never Smoker  . Smokeless tobacco: Never Used  Substance Use Topics  . Alcohol use: No    Alcohol/week: 0.0 standard drinks  . Drug use: No     Allergies   Penicillins   Review of Systems Review of Systems  Unable to perform ROS: Mental status change     Physical Exam Updated Vital Signs BP (!) 182/75   Pulse (!) 102   Temp (!) 102.9 F (39.4 C) (Rectal)   Resp (!) 40   SpO2 95%   Physical Exam  Constitutional: She appears well-developed. She appears distressed (Uncomfortable, groaning occasionally.).  Morbidly obese, elderly female.  HENT:  Head: Normocephalic and atraumatic.  Right Ear: External ear normal.  Left Ear: External ear normal.  Mouth/Throat: Oropharynx is clear and moist.  Eyes: Pupils are equal, round, and reactive to  light. Conjunctivae and EOM are normal.  Neck: Normal range of motion and phonation normal. Neck supple.  Cardiovascular: Normal rate and regular rhythm.  Pulmonary/Chest: No stridor. She is in respiratory distress (Tachypneic). She  has no wheezes. She exhibits no tenderness.  Shallow respirations  Abdominal: Soft. She exhibits no distension. There is no tenderness. There is no guarding.  Musculoskeletal: Normal range of motion. She exhibits edema (Bilateral lower extremity).  Neurological: She exhibits normal muscle tone.  Obtunded  Skin: Skin is warm and dry. No pallor.  Psychiatric:  Lethargic  Nursing note and vitals reviewed.    ED Treatments / Results  Labs (all labs ordered are listed, but only abnormal results are displayed) Labs Reviewed  COMPREHENSIVE METABOLIC PANEL - Abnormal; Notable for the following components:      Result Value   Chloride 96 (*)    Glucose, Bld 300 (*)    BUN 27 (*)    Creatinine, Ser 1.43 (*)    Alkaline Phosphatase 137 (*)    GFR calc non Af Amer 33 (*)    GFR calc Af Amer 38 (*)    All other components within normal limits  CBC WITH DIFFERENTIAL/PLATELET - Abnormal; Notable for the following components:   Hemoglobin 10.2 (*)    HCT 33.4 (*)    MCV 73.1 (*)    MCH 22.3 (*)    RDW 20.0 (*)    nRBC 0.3 (*)    Lymphs Abs 0.6 (*)    All other components within normal limits  URINALYSIS, ROUTINE W REFLEX MICROSCOPIC - Abnormal; Notable for the following components:   Glucose, UA 150 (*)    Ketones, ur 5 (*)    Protein, ur >=300 (*)    All other components within normal limits  I-STAT VENOUS BLOOD GAS, ED - Abnormal; Notable for the following components:   pH, Ven 7.512 (*)    pO2, Ven 50.0 (*)    Bicarbonate 36.8 (*)    TCO2 38 (*)    Acid-Base Excess 12.0 (*)    All other components within normal limits  CULTURE, BLOOD (ROUTINE X 2)  CULTURE, BLOOD (ROUTINE X 2)  URINE CULTURE  BLOOD GAS, VENOUS  I-STAT CG4 LACTIC ACID, ED  I-STAT CG4 LACTIC ACID, ED    EKG None  Radiology Dg Chest Port 1 View  Result Date: 11/26/2017 CLINICAL DATA:  Altered mental status EXAM: PORTABLE CHEST 1 VIEW COMPARISON:  11/13/2017 FINDINGS: AP portable semi upright view of the  chest. The patient is rotated to the right. Stable cardiomegaly with interstitial edema. No pulmonary confluence, effusion or pneumothorax. No acute osseous abnormality. IMPRESSION: Cardiomegaly with interstitial edema.  No significant change. Electronically Signed   By: Ashley Royalty M.D.   On: 11/26/2017 22:48    Procedures .Critical Care Performed by: Daleen Bo, MD Authorized by: Daleen Bo, MD   Critical care provider statement:    Critical care time (minutes):  35   Critical care start time:  11/26/2017 9:20 PM   Critical care end time:  11/27/2017 12:15 AM   Critical care time was exclusive of:  Separately billable procedures and treating other patients   Critical care was necessary to treat or prevent imminent or life-threatening deterioration of the following conditions:  Sepsis   Critical care was time spent personally by me on the following activities:  Blood draw for specimens, development of treatment plan with patient or surrogate, discussions with consultants, evaluation of patient's response to treatment, examination of  patient, obtaining history from patient or surrogate, ordering and performing treatments and interventions, ordering and review of laboratory studies, pulse oximetry, re-evaluation of patient's condition, review of old charts and ordering and review of radiographic studies   (including critical care time)  Medications Ordered in ED Medications  sodium chloride 0.9 % bolus 1,000 mL (1,000 mLs Intravenous New Bag/Given 11/26/17 2211)    And  sodium chloride 0.9 % bolus 600 mL (has no administration in time range)  cefTRIAXone (ROCEPHIN) 2 g in sodium chloride 0.9 % 100 mL IVPB (0 g Intravenous Stopped 11/26/17 2252)  acetaminophen (TYLENOL) suppository 975 mg (975 mg Rectal Given 11/26/17 2207)     Initial Impression / Assessment and Plan / ED Course  I have reviewed the triage vital signs and the nursing notes.  Pertinent labs & imaging results that  were available during my care of the patient were reviewed by me and considered in my medical decision making (see chart for details).  Clinical Course as of Nov 27 13  Mon Nov 26, 2017  2359 Normal  I-Stat CG4 Lactic Acid, ED  (not at  [facility]) [EW]  2359 Normal except chloride low, glucose high, BUN high, creatinine high, alkaline phosphatase high, GFR low  Comprehensive metabolic panel(!) [EW]  0960 Normal except pH elevated, PO2 high, bicarb high, total CO2 high, acid-base high  I-Stat venous blood gas, ED(!) [EW]  Tue Nov 27, 2017  0000 Normal except glucose high, ketones high, protein high, RBC high  Urinalysis, Routine w reflex microscopic (not at [facility])(!) [EW]  0000 No infiltrate or CHF, images reviewed by me  DG Chest [facility] 1 View [EW]    Clinical Course User Index [EW] Daleen Bo, MD     Patient Vitals for the past 24 hrs:  BP Temp Temp src Pulse Resp SpO2  11/26/17 2215 - (!) 102.9 F (39.4 C) Rectal - - -  11/26/17 2215 (!) 182/75 - - (!) 102 (!) 40 95 %  11/26/17 2130 (!) 190/75 - - 98 (!) 40 93 %  11/26/17 2115 (!) 186/87 - - (!) 102 - 99 %  11/26/17 2101 - - - - - 100 %    12:03 AM Reevaluation with update and discussion. After initial assessment and treatment, an updated evaluation reveals clinical exam essentially unchanged.  She continues to be tachypneic.  Continues to be unresponsive.Daleen Bo   Medical Decision Making: Febrile illness without clear etiology.  No overt signs of infections.  Sepsis present, without signs for severe sepsis.  Possible viral process, versus occult bacterial infection.  Meningitis considered however patient's current mental status, and discomfort makes ED lumbar puncture not possible.  Empiric antibiotics begun with Rocephin, suspect a urinary tract infection however the urinalysis is normal.  No evidence for pneumonia.  No evidence for skin or soft tissue infection.  She has no CODE BLUE.  She does not have end of life  directives for specific therapies beyond resuscitation.  Husband would like the patient managed with utilization, and continued antibiotic treatments and IV fluids at this time.  CRITICAL CARE-yes Performed by: Daleen Bo   Nursing Notes Reviewed/ Care Coordinated Applicable Imaging Reviewed Interpretation of Laboratory Data incorporated into ED treatment   12:14 AM-Consult complete with hospitalist. Patient case explained and discussed.  He agrees to admit patient for further evaluation and treatment. Call ended at 12:40 AM   Plan: Admit  Final Clinical Impressions(s) / ED Diagnoses   Final diagnoses:  Sepsis without acute  organ dysfunction, due to unspecified organism [facility])    ED Discharge Orders    None       Daleen Bo, MD 11/27/17 1119

## 2017-11-27 ENCOUNTER — Inpatient Hospital Stay (HOSPITAL_COMMUNITY): Payer: Medicare HMO

## 2017-11-27 ENCOUNTER — Encounter (HOSPITAL_COMMUNITY): Payer: Self-pay | Admitting: Radiology

## 2017-11-27 DIAGNOSIS — F329 Major depressive disorder, single episode, unspecified: Secondary | ICD-10-CM | POA: Diagnosis present

## 2017-11-27 DIAGNOSIS — A0472 Enterocolitis due to Clostridium difficile, not specified as recurrent: Secondary | ICD-10-CM | POA: Diagnosis not present

## 2017-11-27 DIAGNOSIS — I10 Essential (primary) hypertension: Secondary | ICD-10-CM | POA: Diagnosis not present

## 2017-11-27 DIAGNOSIS — E785 Hyperlipidemia, unspecified: Secondary | ICD-10-CM | POA: Diagnosis present

## 2017-11-27 DIAGNOSIS — N184 Chronic kidney disease, stage 4 (severe): Secondary | ICD-10-CM | POA: Diagnosis present

## 2017-11-27 DIAGNOSIS — D631 Anemia in chronic kidney disease: Secondary | ICD-10-CM | POA: Diagnosis present

## 2017-11-27 DIAGNOSIS — D649 Anemia, unspecified: Secondary | ICD-10-CM | POA: Diagnosis not present

## 2017-11-27 DIAGNOSIS — E1165 Type 2 diabetes mellitus with hyperglycemia: Secondary | ICD-10-CM | POA: Diagnosis present

## 2017-11-27 DIAGNOSIS — T502X5A Adverse effect of carbonic-anhydrase inhibitors, benzothiadiazides and other diuretics, initial encounter: Secondary | ICD-10-CM | POA: Diagnosis present

## 2017-11-27 DIAGNOSIS — R6521 Severe sepsis with septic shock: Secondary | ICD-10-CM | POA: Diagnosis not present

## 2017-11-27 DIAGNOSIS — G9341 Metabolic encephalopathy: Secondary | ICD-10-CM | POA: Diagnosis present

## 2017-11-27 DIAGNOSIS — D509 Iron deficiency anemia, unspecified: Secondary | ICD-10-CM | POA: Diagnosis present

## 2017-11-27 DIAGNOSIS — G4089 Other seizures: Secondary | ICD-10-CM | POA: Diagnosis not present

## 2017-11-27 DIAGNOSIS — L89159 Pressure ulcer of sacral region, unspecified stage: Secondary | ICD-10-CM | POA: Diagnosis present

## 2017-11-27 DIAGNOSIS — E1122 Type 2 diabetes mellitus with diabetic chronic kidney disease: Secondary | ICD-10-CM | POA: Diagnosis present

## 2017-11-27 DIAGNOSIS — I130 Hypertensive heart and chronic kidney disease with heart failure and stage 1 through stage 4 chronic kidney disease, or unspecified chronic kidney disease: Secondary | ICD-10-CM | POA: Diagnosis present

## 2017-11-27 DIAGNOSIS — N132 Hydronephrosis with renal and ureteral calculous obstruction: Secondary | ICD-10-CM | POA: Diagnosis present

## 2017-11-27 DIAGNOSIS — E870 Hyperosmolality and hypernatremia: Secondary | ICD-10-CM | POA: Diagnosis not present

## 2017-11-27 DIAGNOSIS — A419 Sepsis, unspecified organism: Secondary | ICD-10-CM | POA: Diagnosis present

## 2017-11-27 DIAGNOSIS — J9602 Acute respiratory failure with hypercapnia: Secondary | ICD-10-CM | POA: Diagnosis not present

## 2017-11-27 DIAGNOSIS — E1129 Type 2 diabetes mellitus with other diabetic kidney complication: Secondary | ICD-10-CM | POA: Diagnosis present

## 2017-11-27 DIAGNOSIS — Z6841 Body Mass Index (BMI) 40.0 and over, adult: Secondary | ICD-10-CM | POA: Diagnosis not present

## 2017-11-27 DIAGNOSIS — E11319 Type 2 diabetes mellitus with unspecified diabetic retinopathy without macular edema: Secondary | ICD-10-CM | POA: Diagnosis present

## 2017-11-27 DIAGNOSIS — K219 Gastro-esophageal reflux disease without esophagitis: Secondary | ICD-10-CM

## 2017-11-27 DIAGNOSIS — Z7189 Other specified counseling: Secondary | ICD-10-CM | POA: Diagnosis not present

## 2017-11-27 DIAGNOSIS — Z66 Do not resuscitate: Secondary | ICD-10-CM | POA: Diagnosis present

## 2017-11-27 DIAGNOSIS — E669 Obesity, unspecified: Secondary | ICD-10-CM | POA: Diagnosis present

## 2017-11-27 DIAGNOSIS — D563 Thalassemia minor: Secondary | ICD-10-CM | POA: Diagnosis present

## 2017-11-27 DIAGNOSIS — R569 Unspecified convulsions: Secondary | ICD-10-CM

## 2017-11-27 DIAGNOSIS — Z515 Encounter for palliative care: Secondary | ICD-10-CM | POA: Diagnosis not present

## 2017-11-27 DIAGNOSIS — N170 Acute kidney failure with tubular necrosis: Secondary | ICD-10-CM | POA: Diagnosis not present

## 2017-11-27 DIAGNOSIS — I5033 Acute on chronic diastolic (congestive) heart failure: Secondary | ICD-10-CM | POA: Diagnosis present

## 2017-11-27 DIAGNOSIS — I5030 Unspecified diastolic (congestive) heart failure: Secondary | ICD-10-CM | POA: Diagnosis not present

## 2017-11-27 DIAGNOSIS — R0902 Hypoxemia: Secondary | ICD-10-CM | POA: Diagnosis not present

## 2017-11-27 DIAGNOSIS — E876 Hypokalemia: Secondary | ICD-10-CM | POA: Diagnosis not present

## 2017-11-27 DIAGNOSIS — Z794 Long term (current) use of insulin: Secondary | ICD-10-CM

## 2017-11-27 LAB — CBC
HCT: 33 % — ABNORMAL LOW (ref 36.0–46.0)
Hemoglobin: 9.5 g/dL — ABNORMAL LOW (ref 12.0–15.0)
MCH: 21.3 pg — AB (ref 26.0–34.0)
MCHC: 28.8 g/dL — AB (ref 30.0–36.0)
MCV: 74 fL — ABNORMAL LOW (ref 80.0–100.0)
Platelets: 160 10*3/uL (ref 150–400)
RBC: 4.46 MIL/uL (ref 3.87–5.11)
RDW: 20.1 % — ABNORMAL HIGH (ref 11.5–15.5)
WBC: 8.3 10*3/uL (ref 4.0–10.5)
nRBC: 0.2 % (ref 0.0–0.2)

## 2017-11-27 LAB — CBG MONITORING, ED
Glucose-Capillary: 304 mg/dL — ABNORMAL HIGH (ref 70–99)
Glucose-Capillary: 363 mg/dL — ABNORMAL HIGH (ref 70–99)

## 2017-11-27 LAB — BLOOD GAS, ARTERIAL
ACID-BASE DEFICIT: 0.3 mmol/L (ref 0.0–2.0)
BICARBONATE: 24.9 mmol/L (ref 20.0–28.0)
Drawn by: 441371
FIO2: 100
O2 SAT: 99.4 %
Patient temperature: 103
pCO2 arterial: 55.3 mmHg — ABNORMAL HIGH (ref 32.0–48.0)
pH, Arterial: 7.291 — ABNORMAL LOW (ref 7.350–7.450)
pO2, Arterial: 340 mmHg — ABNORMAL HIGH (ref 83.0–108.0)

## 2017-11-27 LAB — LACTIC ACID, PLASMA: Lactic Acid, Venous: 1.7 mmol/L (ref 0.5–1.9)

## 2017-11-27 LAB — PROTIME-INR
INR: 1.2
Prothrombin Time: 15.1 seconds (ref 11.4–15.2)

## 2017-11-27 LAB — BASIC METABOLIC PANEL
Anion gap: 15 (ref 5–15)
BUN: 26 mg/dL — AB (ref 8–23)
CALCIUM: 8.8 mg/dL — AB (ref 8.9–10.3)
CO2: 26 mmol/L (ref 22–32)
CREATININE: 1.55 mg/dL — AB (ref 0.44–1.00)
Chloride: 98 mmol/L (ref 98–111)
GFR calc Af Amer: 34 mL/min — ABNORMAL LOW (ref >60)
GFR, EST NON AFRICAN AMERICAN: 30 mL/min — AB (ref >60)
GLUCOSE: 340 mg/dL — AB (ref 70–99)
Potassium: 4 mmol/L (ref 3.5–5.1)
SODIUM: 139 mmol/L (ref 135–145)

## 2017-11-27 LAB — PROCALCITONIN: Procalcitonin: 0.17 ng/mL

## 2017-11-27 LAB — I-STAT CG4 LACTIC ACID, ED: Lactic Acid, Venous: 2.09 mmol/L (ref 0.5–1.9)

## 2017-11-27 LAB — BRAIN NATRIURETIC PEPTIDE: B Natriuretic Peptide: 859.9 pg/mL — ABNORMAL HIGH (ref 0.0–100.0)

## 2017-11-27 LAB — GLUCOSE, CAPILLARY
Glucose-Capillary: 345 mg/dL — ABNORMAL HIGH (ref 70–99)
Glucose-Capillary: 280 mg/dL — ABNORMAL HIGH (ref 70–99)
Glucose-Capillary: 229 mg/dL — ABNORMAL HIGH (ref 70–99)

## 2017-11-27 LAB — APTT: aPTT: 25 seconds (ref 24–36)

## 2017-11-27 LAB — AMMONIA: Ammonia: 22 umol/L (ref 9–35)

## 2017-11-27 MED ORDER — VANCOMYCIN HCL 10 G IV SOLR
2000.0000 mg | Freq: Once | INTRAVENOUS | Status: AC
  Administered 2017-11-27: 2000 mg via INTRAVENOUS
  Filled 2017-11-27: qty 2000

## 2017-11-27 MED ORDER — HEPARIN SODIUM (PORCINE) 5000 UNIT/ML IJ SOLN
5000.0000 [IU] | Freq: Three times a day (TID) | INTRAMUSCULAR | Status: DC
  Administered 2017-11-27 – 2017-12-06 (×27): 5000 [IU] via SUBCUTANEOUS
  Filled 2017-11-27 (×27): qty 1

## 2017-11-27 MED ORDER — SODIUM CHLORIDE 0.9 % IV BOLUS
1000.0000 mL | Freq: Once | INTRAVENOUS | Status: AC
  Administered 2017-11-27: 1000 mL via INTRAVENOUS

## 2017-11-27 MED ORDER — LORAZEPAM 2 MG/ML IJ SOLN
INTRAMUSCULAR | Status: AC
  Administered 2017-11-27: 2 mg
  Filled 2017-11-27: qty 1

## 2017-11-27 MED ORDER — INSULIN ASPART 100 UNIT/ML ~~LOC~~ SOLN
0.0000 [IU] | Freq: Every day | SUBCUTANEOUS | Status: DC
  Administered 2017-11-27 – 2017-12-02 (×4): 2 [IU] via SUBCUTANEOUS
  Administered 2017-12-03: 3 [IU] via SUBCUTANEOUS
  Administered 2017-12-04: 2 [IU] via SUBCUTANEOUS

## 2017-11-27 MED ORDER — ACETAMINOPHEN 650 MG RE SUPP
650.0000 mg | Freq: Four times a day (QID) | RECTAL | Status: DC | PRN
  Administered 2017-11-27 – 2017-11-29 (×4): 650 mg via RECTAL
  Filled 2017-11-27 (×4): qty 1

## 2017-11-27 MED ORDER — ONDANSETRON HCL 4 MG PO TABS: 4.0000 mg | ORAL_TABLET | Freq: Four times a day (QID) | ORAL | Status: DC | PRN

## 2017-11-27 MED ORDER — LEVALBUTEROL HCL 1.25 MG/0.5ML IN NEBU
1.2500 mg | INHALATION_SOLUTION | Freq: Four times a day (QID) | RESPIRATORY_TRACT | Status: DC
  Administered 2017-11-27 – 2017-11-29 (×10): 1.25 mg via RESPIRATORY_TRACT
  Filled 2017-11-27 (×11): qty 0.5

## 2017-11-27 MED ORDER — METOPROLOL TARTRATE 5 MG/5ML IV SOLN: 5.0000 mg | Freq: Three times a day (TID) | INTRAVENOUS | Status: DC

## 2017-11-27 MED ORDER — INSULIN GLARGINE 100 UNIT/ML ~~LOC~~ SOLN
7.0000 [IU] | Freq: Every day | SUBCUTANEOUS | Status: DC
  Administered 2017-11-27 – 2017-12-05 (×9): 7 [IU] via SUBCUTANEOUS
  Filled 2017-11-27 (×10): qty 0.07

## 2017-11-27 MED ORDER — SODIUM CHLORIDE 0.9 % IV SOLN
INTRAVENOUS | Status: DC
  Administered 2017-11-27: 03:00:00 via INTRAVENOUS

## 2017-11-27 MED ORDER — SODIUM CHLORIDE 0.9 % IV SOLN
2.0000 g | INTRAVENOUS | Status: DC
  Administered 2017-11-27: 2 g via INTRAVENOUS
  Filled 2017-11-27 (×2): qty 2

## 2017-11-27 MED ORDER — MORPHINE SULFATE (PF) 2 MG/ML IV SOLN
2.0000 mg | Freq: Once | INTRAVENOUS | Status: AC
  Administered 2017-11-27: 2 mg via INTRAVENOUS
  Filled 2017-11-27: qty 1

## 2017-11-27 MED ORDER — IPRATROPIUM BROMIDE 0.02 % IN SOLN
0.5000 mg | Freq: Four times a day (QID) | RESPIRATORY_TRACT | Status: DC
  Administered 2017-11-27 – 2017-11-29 (×10): 0.5 mg via RESPIRATORY_TRACT
  Filled 2017-11-27 (×10): qty 2.5

## 2017-11-27 MED ORDER — SODIUM CHLORIDE 0.9 % IV SOLN
1.0000 g | Freq: Three times a day (TID) | INTRAVENOUS | Status: DC
  Administered 2017-11-27 (×2): 1 g via INTRAVENOUS
  Filled 2017-11-27 (×4): qty 1

## 2017-11-27 MED ORDER — ACETAMINOPHEN 650 MG RE SUPP
975.0000 mg | Freq: Once | RECTAL | Status: AC
  Administered 2017-11-27: 975 mg via RECTAL
  Filled 2017-11-27: qty 2

## 2017-11-27 MED ORDER — LEVETIRACETAM IN NACL 1000 MG/100ML IV SOLN
1000.0000 mg | Freq: Two times a day (BID) | INTRAVENOUS | Status: DC
  Administered 2017-11-27 – 2017-12-03 (×12): 1000 mg via INTRAVENOUS
  Filled 2017-11-27 (×14): qty 100

## 2017-11-27 MED ORDER — METRONIDAZOLE IN NACL 5-0.79 MG/ML-% IV SOLN
500.0000 mg | Freq: Three times a day (TID) | INTRAVENOUS | Status: DC
  Administered 2017-11-27 – 2017-11-30 (×12): 500 mg via INTRAVENOUS
  Filled 2017-11-27 (×14): qty 100

## 2017-11-27 MED ORDER — INSULIN ASPART 100 UNIT/ML ~~LOC~~ SOLN
0.0000 [IU] | Freq: Three times a day (TID) | SUBCUTANEOUS | Status: DC
  Administered 2017-11-27: 9 [IU] via SUBCUTANEOUS
  Administered 2017-11-27: 5 [IU] via SUBCUTANEOUS
  Administered 2017-11-28 (×3): 2 [IU] via SUBCUTANEOUS
  Administered 2017-11-29 (×3): 1 [IU] via SUBCUTANEOUS
  Administered 2017-11-30 – 2017-12-01 (×3): 2 [IU] via SUBCUTANEOUS
  Administered 2017-12-01: 3 [IU] via SUBCUTANEOUS
  Administered 2017-12-02: 2 [IU] via SUBCUTANEOUS
  Administered 2017-12-02: 5 [IU] via SUBCUTANEOUS
  Administered 2017-12-02 – 2017-12-03 (×2): 3 [IU] via SUBCUTANEOUS
  Administered 2017-12-03: 2 [IU] via SUBCUTANEOUS
  Administered 2017-12-03: 3 [IU] via SUBCUTANEOUS
  Administered 2017-12-04: 5 [IU] via SUBCUTANEOUS
  Administered 2017-12-04 (×2): 2 [IU] via SUBCUTANEOUS
  Administered 2017-12-05: 3 [IU] via SUBCUTANEOUS
  Administered 2017-12-05 (×2): 2 [IU] via SUBCUTANEOUS
  Administered 2017-12-06: 3 [IU] via SUBCUTANEOUS

## 2017-11-27 MED ORDER — INSULIN ASPART 100 UNIT/ML ~~LOC~~ SOLN
SUBCUTANEOUS | Status: AC
  Filled 2017-11-27: qty 1

## 2017-11-27 MED ORDER — LORAZEPAM 2 MG/ML IJ SOLN: 1.0000 mg | Freq: Once | INTRAMUSCULAR | Status: DC | PRN

## 2017-11-27 MED ORDER — VANCOMYCIN HCL 10 G IV SOLR
1250.0000 mg | INTRAVENOUS | Status: DC
  Administered 2017-11-28: 1250 mg via INTRAVENOUS
  Filled 2017-11-27 (×2): qty 1250

## 2017-11-27 MED ORDER — HYDRALAZINE HCL 20 MG/ML IJ SOLN
5.0000 mg | INTRAMUSCULAR | Status: DC | PRN
  Administered 2017-11-27 (×3): 5 mg via INTRAVENOUS
  Filled 2017-11-27 (×3): qty 1

## 2017-11-27 MED ORDER — ONDANSETRON HCL 4 MG/2ML IJ SOLN: 4.0000 mg | Freq: Four times a day (QID) | INTRAMUSCULAR | Status: DC | PRN

## 2017-11-27 MED ORDER — FAMOTIDINE IN NACL 20-0.9 MG/50ML-% IV SOLN
20.0000 mg | Freq: Two times a day (BID) | INTRAVENOUS | Status: DC
  Administered 2017-11-27 – 2017-12-03 (×14): 20 mg via INTRAVENOUS
  Filled 2017-11-27 (×16): qty 50

## 2017-11-27 MED ORDER — SODIUM CHLORIDE 0.9 % IV BOLUS
500.0000 mL | Freq: Once | INTRAVENOUS | Status: AC
  Administered 2017-11-28: 500 mL via INTRAVENOUS

## 2017-11-27 MED ORDER — ACETAMINOPHEN 325 MG PO TABS
650.0000 mg | ORAL_TABLET | Freq: Four times a day (QID) | ORAL | Status: DC | PRN
  Administered 2017-12-01 – 2017-12-02 (×2): 650 mg via ORAL
  Filled 2017-11-27 (×4): qty 2

## 2017-11-27 NOTE — Consult Note (Addendum)
Neurology Consultation  Reason for Consult: New onset seizure Referring Physician: Maye Hides  CC: Seizure  History is obtained from: Rapid response  HPI: Lauren Crosby is a 84 y.o. female with history of gout, goiter, hepatitis, hernia, hyperlipidemia, hypertension, nephrolithiasis, diabetes, colon polyps, thalassemia minor, shortness of breath and obesity.  She is in the hospital secondary to sepsis.  Currently she is being treated for sepsis with most recent BNP 859, creatinine 1.55 and BUN 26.  Today at 1300 hrs, RN arrived in the room and found the patient had labored breathing and was in distress.  Rapid response was called.  Upon entering the room they noted that the patient was looking to her right and she had a right sided partial seizure which then generalized to a tonic-clonic seizure.  She was given 2 mg of Ativan and 1 g of Keppra.  Post seizure patient went apneic and pulseless for a short period of time - duration is not known exactly -- then had spontaneous return of rhythm and circulation .  Neurology was consulted.  Upon entering the room for consult patient was on BiPAP and showing no seizure activity.  However upon looking at her eyes, they remain deviated to the right.  She was also having labored breathing.  At that time stat EEG was ordered and additional 2 mg of IV Ativan was ordered.  Currently husband has made her a DNR.  ROS:  Unable to obtain due to altered mental status.   Past Medical History:  Diagnosis Date  . Adrenal adenoma   . Anemia   . Arthritis    OA AND PAIN BOTH KNEES BUT RIGHT KNEE PAIN WORSE; SOME LOWER BACK PAIN  . Cellulitis and abscess of trunk   . Colon polyp    adenomatous  . Diabetes ([place])    type 2  PT IS ON ORAL MEDICATION AND INSULIN  . Diverticulitis   . GERD (gastroesophageal reflux disease)    NO MEDS FOR GERD  . Goiter   . Gout   . Hepatitis    YELLOW JAUNDICE WHEN A CHILD  . Hernia   . Hyperlipemia   . Hypertension   .  Nephrolithiasis   . 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 Obesity   . Open wound of abdominal wall, anterior, without mention of complication   . Pneumonia    IN THE PAST  . Retinopathy due to secondary diabetes mellitus ([place])   . Shortness of breath    PT IS NOT ABLE TO BE ACTIVE BECAUSE OF KNEE PROBLEM; HAS ALWAYS HAD PROBLEM WITH BREATHING HEAVY - DOES GET SOB WITH EXERTION.  Marland Kitchen Thalassemia minor      Family History  Problem Relation Age of Onset  . Diabetes Father   . Hypertension Father   . Kidney disease Father   . Liver disease Mother   . Hypertension Unknown   . Rheum arthritis Sister   . Diverticulitis Sister   . Hypertension Sister   . Colon cancer Neg Hx   . Colon polyps Neg Hx   . Esophageal cancer Neg Hx   . Pancreatic cancer Neg Hx   . Stomach cancer Neg Hx    Social History:   reports that she has never smoked. She has never used smokeless tobacco. She reports that she does not drink alcohol or use drugs.  Medications  Current Facility-Administered Medications:  .  acetaminophen (TYLENOL) tablet 650 mg, 650 mg, Oral, Q6H PRN **OR** acetaminophen (  TYLENOL) suppository 650 mg, 650 mg, Rectal, Q6H PRN, Ivor Costa, MD, 650 mg at 11/27/17 0736 .  aztreonam (AZACTAM) 1 g in sodium chloride 0.9 % 100 mL IVPB, 1 g, Intravenous, Q8H, Bryk, Veronda P, RPH, Stopped at 11/27/17 1150 .  famotidine (PEPCID) IVPB 20 mg premix, 20 mg, Intravenous, Q12H, Ivor Costa, MD, Stopped at 11/27/17 1035 .  heparin injection 5,000 Units, 5,000 Units, Subcutaneous, Q8H, Ivor Costa, MD, 5,000 Units at 11/27/17 [phone] .  hydrALAZINE (APRESOLINE) injection 5 mg, 5 mg, Intravenous, Q2H PRN, Ivor Costa, MD, 5 mg at 11/27/17 1148 .  insulin aspart (novoLOG) injection 0-5 Units, 0-5 Units, Subcutaneous, QHS, Niu, Xilin, MD .  insulin aspart (novoLOG) injection 0-9 Units, 0-9 Units, Subcutaneous, TID WC, Ivor Costa, MD, 9 Units at 11/27/17 0857 .  insulin glargine (LANTUS)  injection 7 Units, 7 Units, Subcutaneous, QHS, Niu, Xilin, MD .  ipratropium (ATROVENT) nebulizer solution 0.5 mg, 0.5 mg, Nebulization, Q6H, Ivor Costa, MD, 0.5 mg at 11/27/17 0821 .  levalbuterol (XOPENEX) nebulizer solution 1.25 mg, 1.25 mg, Nebulization, Q6H, Ivor Costa, MD, 1.25 mg at 11/27/17 0821 .  levETIRAcetam (KEPPRA) IVPB 1000 mg/100 mL premix, 1,000 mg, Intravenous, Q12H, Cristal Ford, DO, Last Rate: 400 mL/hr at 11/27/17 1308, 1,000 mg at 11/27/17 1308 .  metroNIDAZOLE (FLAGYL) IVPB 500 mg, 500 mg, Intravenous, Q8H, Ivor Costa, MD, Stopped at 11/27/17 1103 .  ondansetron (ZOFRAN) tablet 4 mg, 4 mg, Oral, Q6H PRN **OR** ondansetron (ZOFRAN) injection 4 mg, 4 mg, Intravenous, Q6H PRN, Ivor Costa, MD .  Derrill Memo ON 11/28/2017] vancomycin (VANCOCIN) 1,250 mg in sodium chloride 0.9 % 250 mL IVPB, 1,250 mg, Intravenous, Q24H, Bryk, Veronda P, RPH   Exam: Current vital signs: BP (!) 158/62 (BP Location: Right Arm)   Pulse (!) 116   Temp (!) 102.6 F (39.2 C) (Axillary)   Resp (!) 42   SpO2 100%  Vital signs in last 24 hours: Temp:  [100.5 F (38.1 C)-103.3 F (39.6 C)] 102.6 F (39.2 C) (11/19 1319) Pulse Rate:  [39-120] 116 (11/19 1319) Resp:  [24-42] 42 (11/19 1319) BP: (122-202)/(41-104) 158/62 (11/19 1319) SpO2:  [90 %-100 %] 100 % (11/19 1319) FiO2 (%):  [40 %] 40 % (11/19 1319)  Physical Exam  Constitutional: Obese Psych: Currently on BiPAP and altered mental status Eyes: No scleral injection HENT: On BiPAP with labored breathing Head: Normocephalic.  Cardiovascular: Tachycardic on monitor Respiratory: Effort normal, non-labored breathing GI: Soft.  No distension. There is no tenderness.  Skin: WDI  Neuro: Mental Status: Patient currently is on BiPAP only withdraws to pain, eyes are deviated to the right but no nystagmus or other seizure activity is noted. Cranial Nerves: II: No blink to threat. Pupils are equal, round, and reactive to light.   III,IV, VI:  Eyes deviated to the right but without nystagmus. After given Ativan eyes went to midline; however, soon after this eyes again would intermittently become deviated to the right V: Unable to assess VII: Facial movement is symmetric.  VIII: No response to verbal stimuli.  Motor/Sensory: Withdraws all extremities from noxious stimuli with good strength. No asymmetry noted. No jerking, twitching, posturing or other motor activity suggestive of ongoing seizure seen during neurological exam.  Deep Tendon Reflexes: Trace deep tendon reflexes were elicitable Plantars: Bilateral toes are upgoing Cerebellar: Unable to assess   Labs I have reviewed labs in epic and the results pertinent to this consultation are:   CBC    Component Value Date/Time  WBC 8.3 11/27/2017 0327   RBC 4.46 11/27/2017 0327   HGB 9.5 (L) 11/27/2017 0327   HGB 9.1 (L) 10/26/2009 1512   HCT 33.0 (L) 11/27/2017 0327   HCT 28.0 (L) 10/26/2009 1512   PLT 160 11/27/2017 0327   PLT 222 10/26/2009 1512   MCV 74.0 (L) 11/27/2017 0327   MCV 66.3 (L) 10/26/2009 1512   MCH 21.3 (L) 11/27/2017 0327   MCHC 28.8 (L) 11/27/2017 0327   RDW 20.1 (H) 11/27/2017 0327   RDW 17.9 (H) 10/26/2009 1512   LYMPHSABS 0.6 (L) 11/26/2017 2152   LYMPHSABS 1.5 10/26/2009 1512   MONOABS 0.2 11/26/2017 2152   MONOABS 0.4 10/26/2009 1512   EOSABS 0.0 11/26/2017 2152   EOSABS 0.1 10/26/2009 1512   BASOSABS 0.0 11/26/2017 2152   BASOSABS 0.0 10/26/2009 1512    CMP     Component Value Date/Time   NA 139 11/27/2017 0327   K 4.0 11/27/2017 0327   CL 98 11/27/2017 0327   CO2 26 11/27/2017 0327   GLUCOSE 340 (H) 11/27/2017 0327   BUN 26 (H) 11/27/2017 0327   CREATININE 1.55 (H) 11/27/2017 0327   CREATININE 0.85 08/14/2012 0930   CALCIUM 8.8 (L) 11/27/2017 0327   PROT 8.1 11/26/2017 2152   ALBUMIN 3.6 11/26/2017 2152   AST 29 11/26/2017 2152   ALT 21 11/26/2017 2152   ALKPHOS 137 (H) 11/26/2017 2152   BILITOT 1.0 11/26/2017 2152    GFRNONAA 30 (L) 11/27/2017 0327   GFRNONAA 66 08/14/2012 0930   GFRAA 34 (L) 11/27/2017 0327   GFRAA 76 08/14/2012 0930    Lipid Panel     Component Value Date/Time   CHOL 120 06/26/2010 0530   TRIG 101 06/26/2010 0530   HDL 48 06/26/2010 0530   CHOLHDL 2.5 06/26/2010 0530   VLDL 20 06/26/2010 0530   LDLCALC 52 06/26/2010 0530     Imaging I have reviewed the images obtained:  CT-scan of the brain-head CT was obtained today and showed no acute intracranial abnormalities.  EEG is currently underway  Etta Quill PA-C [facility] [phone] 11/27/2017, 1:56 PM    Assessment:  84 year old female admitted due to septic shock and C. difficile. Today at approximately 1300 patient experienced a new onset seizure with period of PEA arrest.   1. Currently she may be in subclinical status epilepticus after receiving 4 mg of Ativan and 1000 mg IV Keppra load.  2. Currently on BiPAP  3. DNR per husband, who stated several times that he does not wish for intubation, even for burst suppression to be performed should status epilepticus be confirmed with EEG  Recommendations: -We will obtain EEG and make further recommendations and adjustments with antiepileptic medications at that time. -Continue to treat sepsis  Addendum: EEG completed with official report noting background slowing but no epileptiform activity.   I have examined the patient and have formulated the assessment and recommendations above. I have discussed the plan of care with the patient's family.  Electronically signed: Dr. Kerney Elbe

## 2017-11-27 NOTE — Progress Notes (Signed)
STAT EEG completed; results pending. 

## 2017-11-27 NOTE — ED Notes (Signed)
Patient transported to CT 

## 2017-11-27 NOTE — Progress Notes (Addendum)
Admitted after midnight by Dr. Ivor Costa. See full H&P for details.  84 year old female with a history of hypertension, hyperlipidemia, diabetes, recent admission from 10/22 to 11/16/2017 due to septic shock due to C. difficile colitis and required dialysis due to renal impairment.  Patient presented to the emergency department with increased confusion and groaning, was found to have sepsis due to fever, tachycardia and tachypnea, with unknown source.  Right and UA unremarkable for infection.  CT abdomen pelvis showed punctate stone in the distal left ureter with mild proximal hydronephrosis and hydroureter.  Given the patient has a penicillin allergy, she was placed on vancomycin, Flagyl and aztreonam.  During hospital course, patient became more tachypneic with labored breathing.  Rapid response was called and was noted to have right gaze with focal seizure.  Ativan as well as Keppra 1 g ordered.  Neurology consulted and appreciated.  Have placed patient on BiPAP.  ABG pending. Will also order respiratory viral panel.   Had long discussion with patient's husband at bedside, regarding CODE STATUS and goals of care.  At this time he would like to continue treatment however if patient deteriorates again, would move towards comfort care measures.  She currently is a DNR which she agrees with.  He does not wish to have his wife suffer.  Total patient care time: 31 minutes  Treylin Burtch D.O. [facility] Pager [phone]  If 7PM-7AM, please contact night-coverage [email or web address] Password [email or web address] 11/27/2017, 1:55 PM

## 2017-11-27 NOTE — Care Management Note (Signed)
Case Management Note  Patient Details  Name: Lauren Crosby MRN: [id number] Date of Birth: 09/22/1931  Subjective/Objective:      Presented with AMS, hx of  diabetes mellitus, chronic kidney disease stage III hypertension, hyperlipidemia, morbid obesity, chronic lower extremity swelling. Recently hospitalized from 10/22-11/8 due to septic shock. From  [facility].   Davene Costain (Spouse) Fredderick Phenix (Daughter)      [phone]         PCP: [name]  Action/Plan: NCM monitoring for TOC needs...  Expected Discharge Date:                  Expected Discharge Plan:  Skilled Nursing Facility  In-House Referral:  Clinical Social Work  Discharge planning Services  CM Consult  Post Acute Care Choice:    Choice offered to:     DME Arranged:  N/A DME Agency:  NA  HH Arranged:    [place] Agency:  NA  Status of Service:  In process, will continue to follow  If discussed at Long Length of Stay Meetings, dates discussed:    Additional Comments:  Sharin Mons, RN 11/27/2017, 2:22 PM

## 2017-11-27 NOTE — Progress Notes (Signed)
Pt BP currently 86/39, MAP 54. Pulse is 70 and regular. Pt resting with Bipap and does not appear in distress. Does not respond to commands. Paged Schorr, NP. Will continue to monitor.

## 2017-11-27 NOTE — ED Notes (Signed)
hospitalist Provider at bedside. 

## 2017-11-27 NOTE — ED Notes (Signed)
CBG was 363. Notified [place], Therapist, sports.

## 2017-11-27 NOTE — Progress Notes (Signed)
Upon patient arriving to the unit, patient breathing labored and in distress, Rapid Response called and MD informed of patient respiratory status. Rapid at beside along with nursing staff getting patient settled, Patient begin looking to her right with a gaze appearing to be focal seizure. Then it  progressed to tonic colonic and patient stop breathing and went pulseless.  MD updated new information and came to bedside. Patient currently on on Bipap

## 2017-11-27 NOTE — Progress Notes (Signed)
Pharmacy Antibiotic Note  Lauren Crosby is a 84 y.o. female admitted on 11/26/2017 with sepsis.    Pt doesn't have true PCN allergy because she got 1 dose of ceftriaxone on admission. Since she was recently dc from here, Dr. Ree Kida would like to it broad for now. Therefore, we will use cefepime instead.   Plan: Dc aztreonam Cefepime 2g IV q24   Temp (24hrs), Avg:102.2 F (39 C), Min:100.5 F (38.1 C), Max:103.3 F (39.6 C)  Recent Labs  Lab 11/26/17 2152 11/26/17 2201 11/27/17 0104 11/27/17 0327  WBC 7.2  --   --  8.3  CREATININE 1.43*  --   --  1.55*  LATICACIDVEN  --  1.65 2.09* 1.7    Estimated Creatinine Clearance: 31.6 mL/min (A) (by C-G formula based on SCr of 1.55 mg/dL (H)).    Allergies  Allergen Reactions  . Penicillins Other (See Comments)    Unknown reaction Has patient had a PCN reaction causing immediate rash, facial/tongue/throat swelling, SOB or lightheadedness with hypotension: No Has patient had a PCN reaction causing severe rash involving mucus membranes or skin necrosis: No Has patient had a PCN reaction that required hospitalization: No Has patient had a PCN reaction occurring within the last 10 years: Unknown If all of the above answers are "NO", then may proceed with Cephalosporin use. Have received ceftriaxone     Onnie Boer, PharmD, BCIDP, AAHIVP, CPP Infectious Disease Pharmacist 11/27/2017 3:05 PM

## 2017-11-27 NOTE — Procedures (Signed)
ELECTROENCEPHALOGRAM REPORT   Patient: Lauren Crosby       Room #: 9U76L EEG No. ID: [id number] Age: 84 y.o.        Sex: female Referring Physician: Ree Kida Report Date:  11/27/2017        Interpreting Physician: Alexis Goodell  History: TRISTIAN BOUSKA is an 84 y.o. female with new onset seizure  Medications:  Cefepime, Pepcid, Insulin, Keppra, Flagyl, Vancomycin, Ativan  Conditions of Recording:  This is a 21 channel routine scalp EEG performed with bipolar and monopolar montages arranged in accordance to the international 10/20 system of electrode placement. One channel was dedicated to EKG recording.  The patient is in the lethargic state.  Description:  Artifact is prominent during the recording often obscuring the background rhythm. When able to be visualized the background is slow and poorly organized.   It consists of a very low voltage, polymorphic delta rhythm that is diffusely distributed and continuous. No epileptiform activity is noted.   Hyperventilation and intermittent photic stimulation were not performed.   IMPRESSION: This is an abnormal EEG secondary to general background slowing.  This finding may be seen with a diffuse disturbance that is etiologically nonspecific, but may include a metabolic encephalopathy, among other possibilities.  No epileptiform activity was noted.     Alexis Goodell, MD Neurology [phone] 11/27/2017, 7:05 PM

## 2017-11-27 NOTE — ED Notes (Signed)
Ice packs applied to pt's under arms and groin. Cool towel placed on forehead.

## 2017-11-27 NOTE — ED Notes (Signed)
Alert to verbal. Pt was able to squeeze my hand upon command.

## 2017-11-27 NOTE — Progress Notes (Signed)
Pharmacy Antibiotic Note  Lauren Crosby is a 84 y.o. female admitted on 11/26/2017 with sepsis.  Pharmacy has been consulted for vancomycin and aztreonam dosing.  Plan: Vancomycin 2000mg  x1 then 1250mg  IV every 24 hours.  Goal trough 15-20 mcg/mL.  Aztreonam 1g IV eery 8 hours.   Temp (24hrs), Avg:102.9 F (39.4 C), Min:102.9 F (39.4 C), Max:102.9 F (39.4 C)  Recent Labs  Lab 11/26/17 2152 11/26/17 2201 11/27/17 0104  WBC 7.2  --   --   CREATININE 1.43*  --   --   LATICACIDVEN  --  1.65 2.09*    Estimated Creatinine Clearance: 34.2 mL/min (A) (by C-G formula based on SCr of 1.43 mg/dL (H)).    Allergies  Allergen Reactions  . Penicillins Other (See Comments)    Unknown reaction Has patient had a PCN reaction causing immediate rash, facial/tongue/throat swelling, SOB or lightheadedness with hypotension: No Has patient had a PCN reaction causing severe rash involving mucus membranes or skin necrosis: No Has patient had a PCN reaction that required hospitalization: No Has patient had a PCN reaction occurring within the last 10 years: Unknown If all of the above answers are "NO", then may proceed with Cephalosporin use.     Thank you for allowing pharmacy to be a part of this patient's care.  Wynona Neat, PharmD, BCPS  11/27/2017 1:56 AM

## 2017-11-27 NOTE — Significant Event (Signed)
Rapid Response Event Note  Overview: Time Called: 1234 Arrival Time: 1236 Event Type: Respiratory  Initial Focused Assessment: Patient recently arrived from ED, in respiratory distress Using accessory muscles 158/62  HR 116  RR 42  O2 sats 100%  On [place] Temp 102 axillary Patient suddenly began seizing:  Started with right gaze deviation then focal seizure of eye and jaw then right arm then progressed to grand mal lasting about a minute. Patient became very pale then stopped breathing a short period of time and became bradycardic with poor pulse.  NRB mask placed 15L flow.  Patient began breathing again and regain strong pulse.  Interventions: Dr Ree Kida at bedside to assess patient and clarified code status 2mg  Ativan given IV Placed on Bipap  18/8   100%   1 g Keppra given IV Patient then appeared postictal  ABG done FiO2 weaned to 40% Patient continued with RR 40s O2 sats 100% 2mg  Morphine given Waunita Schooner PA (neurohospitalist) at bedside, pt continues with right gaze preference, no clinical signs of seizure, withdraws to pain. Stat EEG done Patient starting to cough spontaneously.  Plan of Care (if not transferred): Per Dr Ree Kida plan will be transition to comfort if patient continues to decline  Event Summary: Name of Physician Notified: Mikhail at 1240    at    Outcome: Stayed in room and stabalized, Code status clarified  Event End Time: [place]  Raliegh Ip

## 2017-11-27 NOTE — H&P (Addendum)
History and Physical    [name] CZY:[id number] DOB: Dec 18, 1931 DOA: 11/26/2017  Referring MD/NP/PA:   PCP: [name], MD   Patient coming from:  The patient is coming from rehab.  At baseline, pt is dependent for most of ADL.        Chief Complaint: Confusion and fever  HPI: Lauren Crosby is a 84 y.o. female with medical history significant of hypertension, hyperlipidemia, diabetes mellitus, GERD, gout, iron deficiency anemia, diverticulitis, kidney stone, thalassemia minor,  Patient was recently hospitalized from 10/22-11/8 due to septic shock which was possibly due to C. difficile colitis. Pt also had worsening renal function and required CRRT in ICU. Her kidney function has improved.  Her creatinine was 1.6 on 11/8. Pt was discharged on Lasix 40 mg daily to nursing home for rehab as stable condition.  Per her husband, pt becomes confused today at about 5:15 PM. She is less responsive than usual. Patient has been "groaning.  Patient seems to have pain somewhere. Pt also has fever, elevated blood sugar up to 401 and elevated blood pressure, with SBP 177.  Patient does not have active cough, nausea, vomiting, diarrhea.  She slightly moves her extremities, but does not seem to have unilateral weakness.  ED Course: pt was found to have WBC 7.2, lactic acid of 1.65, 2.09, slightly worsening renal function, negative urinalysis, temperature 102.9, tachycardia, tachypnea, oxygen sat 93 to 95% on room air.  Chest x-ray showed cardiomegaly and interstitial edema.  CT head is negative for acute intracranial abnormalities.  CT-abdomen/pelvis showed: 1. Atelectasis or consolidation in the lung bases, greater on theleft. 2. Left adrenal gland adenoma. 3. Bilateral renal parenchymal atrophy. Punctate stones suggested in the distal left ureter. Mild proximal hydronephrosis and hydroureter. Gas in the bladder may be due to instrumentation or infection. 4. Prominent stool in the rectum  with rectal wall thickening suggesting possible stercoral colitis. 5. Small amount of free fluid in the abdomen and pelvis, likely ascites. 6. Probable hemorrhagic cysts in the right kidney.  Review of Systems: Could not be reviewed due to altered mental status.  Allergy:  Allergies  Allergen Reactions  . Penicillins Other (See Comments)    Unknown reaction Has patient had a PCN reaction causing immediate rash, facial/tongue/throat swelling, SOB or lightheadedness with hypotension: No Has patient had a PCN reaction causing severe rash involving mucus membranes or skin necrosis: No Has patient had a PCN reaction that required hospitalization: No Has patient had a PCN reaction occurring within the last 10 years: Unknown If all of the above answers are "NO", then may proceed with Cephalosporin use.    Past Medical History:  Diagnosis Date  . Adrenal adenoma   . Anemia   . Arthritis    OA AND PAIN BOTH KNEES BUT RIGHT KNEE PAIN WORSE; SOME LOWER BACK PAIN  . Cellulitis and abscess of trunk   . Colon polyp    adenomatous  . Diabetes ([place])    type 2  PT IS ON ORAL MEDICATION AND INSULIN  . Diverticulitis   . GERD (gastroesophageal reflux disease)    NO MEDS FOR GERD  . Goiter   . Gout   . Hepatitis    YELLOW JAUNDICE WHEN A CHILD  . Hernia   . Hyperlipemia   . Hypertension   . Nephrolithiasis   . 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 Obesity   . Open wound of abdominal wall, anterior, without mention of complication   .  Pneumonia    IN THE PAST  . Retinopathy due to secondary diabetes mellitus ([place])   . Shortness of breath    PT IS NOT ABLE TO BE ACTIVE BECAUSE OF KNEE PROBLEM; HAS ALWAYS HAD PROBLEM WITH BREATHING HEAVY - DOES GET SOB WITH EXERTION.  Marland Kitchen Thalassemia minor     Past Surgical History:  Procedure Laterality Date  . APPENDECTOMY     1993 PT HAD APPENDECTOMY AND ABDOMINAL SURGERY FRO DIVERTICULITS  . CARDIAC CATHETERIZATION  2008    conclusion: smooth and normal coronary arteries, normal left ventricular systolic function  . CHOLECYSTECTOMY    . COLONOSCOPY  10/04/06  . CYSTOSCOPY WITH BIOPSY N/A 06/16/2016   Procedure: CYSTOSCOPY WITH  BLADDER BIOPSY AND FULGERATION 1CM;  Surgeon: Festus Aloe, MD;  Location: [facility];  Service: Urology;  Laterality: N/A;  . DIAGNOSTIC MAMMOGRAM  03/17/2011  . IR FLUORO GUIDE CV LINE RIGHT  11/03/2017  . IR US GUIDE VASC ACCESS RIGHT  11/03/2017  . ROTATOR CUFF REPAIR Left   . SIGMOID RESECTION / RECTOPEXY    . TONSILLECTOMY    . TOTAL ABDOMINAL HYSTERECTOMY    . TOTAL KNEE ARTHROPLASTY Right 08/11/2013   Procedure: RIGHT TOTAL KNEE ARTHROPLASTY;  Surgeon: Mauri Pole, MD;  Location: [facility];  Service: Orthopedics;  Laterality: Right;  . VENTRAL HERNIA REPAIR  2007    Social History:  reports that she has never smoked. She has never used smokeless tobacco. She reports that she does not drink alcohol or use drugs.  Family History:  Family History  Problem Relation Age of Onset  . Diabetes Father   . Hypertension Father   . Kidney disease Father   . Liver disease Mother   . Hypertension Unknown   . Rheum arthritis Sister   . Diverticulitis Sister   . Hypertension Sister   . Colon cancer Neg Hx   . Colon polyps Neg Hx   . Esophageal cancer Neg Hx   . Pancreatic cancer Neg Hx   . Stomach cancer Neg Hx      Prior to Admission medications   Medication Sig Start Date End Date Taking? Authorizing Provider  Amino Acids-Protein Hydrolys (FEEDING SUPPLEMENT, PRO-STAT SUGAR FREE 64,) LIQD Take 30 mLs by mouth 2 (two) times daily between meals. 11/16/17  Yes Elgergawy, Silver Huguenin, MD  ferrous sulfate 325 (65 FE) MG tablet Take 1 tablet (325 mg total) by mouth 2 (two) times daily with a meal. 11/16/17  Yes Elgergawy, Silver Huguenin, MD  furosemide (LASIX) 40 MG tablet Take 40 mg by mouth daily.   Yes [provider]  guaiFENesin-dextromethorphan (ROBITUSSIN DM) 100-10 MG/5ML syrup Take  5 mLs by mouth every 4 (four) hours as needed for cough. 01/14/17  Yes Rosita Fire, MD  insulin aspart (NOVOLOG) 100 UNIT/ML injection Inject 0-9 Units into the skin every 4 (four) hours. Patient taking differently: Inject 1-9 Units into the skin See admin instructions. Inject 1-9 units subcutaneously four times daily - before meals and at bedtime - per sliding scale: CBG 121-150 1 unit, 151-200 2 units, 201-250 3 units, 251-300 5 units, 301-350 7 units, 351-400 9 units, >400 9 units and call MD 11/16/17  Yes Elgergawy, Silver Huguenin, MD  insulin glargine (LANTUS) 100 UNIT/ML injection Inject 0.1 mLs (10 Units total) into the skin at bedtime. 11/16/17  Yes Elgergawy, Silver Huguenin, MD  ipratropium-albuterol (DUONEB) 0.5-2.5 (3) MG/3ML SOLN Take 3 mLs by nebulization every 4 (four) hours as needed. Patient taking differently:  Take 3 mLs by nebulization every 4 (four) hours as needed (shortness of breath/wheezing).  11/16/17  Yes Elgergawy, Silver Huguenin, MD  metoprolol tartrate (LOPRESSOR) 25 MG tablet Take 0.5 tablets (12.5 mg total) by mouth 2 (two) times daily. 11/16/17  Yes Elgergawy, Silver Huguenin, MD  Multiple Vitamin (MULTIVITAMIN WITH MINERALS) TABS tablet Take 1 tablet by mouth daily.   Yes [provider]  Multiple Vitamins-Minerals (PRESERVISION AREDS 2) CAPS Take 1 capsule by mouth daily.   Yes [provider]  OXYGEN Inhale 2 L into the lungs continuous.   Yes [provider]  QUEtiapine (SEROQUEL) 25 MG tablet Take 25 mg by mouth 2 (two) times daily.   Yes [provider]  ranitidine (ZANTAC) 150 MG/10ML syrup Take 5 mLs (75 mg total) by mouth 2 (two) times daily. 11/16/17  Yes Elgergawy, Silver Huguenin, MD  simvastatin (ZOCOR) 80 MG tablet Take 80 mg by mouth at bedtime.    Yes [provider]  sucralfate (CARAFATE) 1 g tablet Take 1 g by mouth 4 (four) times daily.   Yes [provider]  furosemide (LASIX) 20 MG tablet Take 2 tablets (40 mg total) by mouth  daily. Patient not taking: Reported on 11/26/2017 11/16/17   Elgergawy, Silver Huguenin, MD    Physical Exam: Vitals:   11/27/17 0230 11/27/17 0245 11/27/17 0300 11/27/17 0358  BP: (!) 142/65 (!) 154/97 (!) 122/52 (!) 163/87  Pulse: (!) 108 (!) 111 (!) 106 (!) 116  Resp:  (!) 30 (!) 34 (!) 42  Temp:      TempSrc:      SpO2: 98% 93% 92% 94%   General: Not in acute distress HEENT:       Eyes: PERRL, EOMI, no scleral icterus.       ENT: No discharge from the ears and nose, no pharynx injection, no tonsillar enlargement.        Neck: No JVD, no bruit, no mass felt. Heme: No neck lymph node enlargement. Cardiac: S1/S2, RRR, No murmurs, No gallops or rubs. Respiratory: No rales, wheezing, rhonchi or rubs. GI: Soft, mildly distended, nontender, no organomegaly, BS present. GU: No hematuria Ext: has venous insufficiency change in legs.  1+ pitting leg edema bilaterally. 2+DP/PT pulse bilaterally. Musculoskeletal: No joint deformities, No joint redness or warmth, no limitation of ROM in spin. Skin: No rashes.  Neuro: Altered mental status, not oriented X3, not cooperating for physical examination.  Slightly moves extremities on painful stimuli.  Psych: Patient is not psychotic, no suicidal or hemocidal ideation.  Labs on Admission: I have personally reviewed following labs and imaging studies  CBC: Recent Labs  Lab 11/26/17 2152  WBC 7.2  NEUTROABS 6.3  HGB 10.2*  HCT 33.4*  MCV 73.1*  PLT 229   Basic Metabolic Panel: Recent Labs  Lab 11/26/17 2152  NA 138  K 4.2  CL 96*  CO2 30  GLUCOSE 300*  BUN 27*  CREATININE 1.43*  CALCIUM 9.3   GFR: Estimated Creatinine Clearance: 34.2 mL/min (A) (by C-G formula based on SCr of 1.43 mg/dL (H)). Liver Function Tests: Recent Labs  Lab 11/26/17 2152  AST 29  ALT 21  ALKPHOS 137*  BILITOT 1.0  PROT 8.1  ALBUMIN 3.6   No results for input(s): LIPASE, AMYLASE in the last 168 hours. No results for input(s): AMMONIA in the last 168  hours. Coagulation Profile: No results for input(s): INR, PROTIME in the last 168 hours. Cardiac Enzymes: No results for input(s): CKTOTAL, CKMB,  CKMBINDEX, TROPONINI in the last 168 hours. BNP (last 3 results) No results for input(s): PROBNP in the last 8760 hours. HbA1C: No results for input(s): HGBA1C in the last 72 hours. CBG: Recent Labs  Lab 11/27/17 0214  GLUCAP 304*   Lipid Profile: No results for input(s): CHOL, HDL, LDLCALC, TRIG, CHOLHDL, LDLDIRECT in the last 72 hours. Thyroid Function Tests: No results for input(s): TSH, T4TOTAL, FREET4, T3FREE, THYROIDAB in the last 72 hours. Anemia Panel: No results for input(s): VITAMINB12, FOLATE, FERRITIN, TIBC, IRON, RETICCTPCT in the last 72 hours. Urine analysis:    Component Value Date/Time   COLORURINE YELLOW 11/26/2017 2152   APPEARANCEUR CLEAR 11/26/2017 2152   LABSPEC 1.012 11/26/2017 2152   PHURINE 7.0 11/26/2017 2152   GLUCOSEU 150 (A) 11/26/2017 2152   HGBUR NEGATIVE 11/26/2017 2152   BILIRUBINUR NEGATIVE 11/26/2017 2152   BILIRUBINUR neg 09/28/2014 1020   KETONESUR 5 (A) 11/26/2017 2152   PROTEINUR >=300 (A) 11/26/2017 2152   UROBILINOGEN negative 09/28/2014 1020   UROBILINOGEN 0.2 07/31/2013 1042   NITRITE NEGATIVE 11/26/2017 2152   LEUKOCYTESUR NEGATIVE 11/26/2017 2152   Sepsis Labs: @LABRCNTIP (procalcitonin:4,lacticidven:4) )No results found for this or any previous visit (from the past 240 hour(s)).   Radiological Exams on Admission: Ct Abdomen Pelvis Wo Contrast  Result Date: 11/27/2017 CLINICAL DATA:  Abdominal distention, altered mental status, sepsis. History of Clostridium difficile colitis, open wound of the abdominal wall, nephrolithiasis, hernia, gastroesophageal reflux disease, diverticulitis, colon polyp, cellulitis and abscess, adrenal adenoma, ventral hernia repair, hysterectomy, sigmoid resection, cholecystectomy, appendectomy. EXAM: CT ABDOMEN AND PELVIS WITHOUT CONTRAST TECHNIQUE:  Multidetector CT imaging of the abdomen and pelvis was performed following the standard protocol without IV contrast. COMPARISON:  10/30/2017 FINDINGS: Lower chest: Evaluation is limited due to motion artifact. Atelectasis or consolidation in the lung bases, greater on the left. Coronary artery calcifications. Calcification in the mitral valve annulus. Hepatobiliary: No focal liver abnormality is seen. Status post cholecystectomy. No biliary dilatation. Pancreas: Fatty infiltration of the pancreas. Spleen: Normal in size without focal abnormality. Adrenals/Urinary Tract: 3.1 cm diameter left adrenal gland nodule. No change since previous study. Fat attenuation consistent with benign adenoma. Bilateral renal parenchymal atrophy. Mild left hydronephrosis and hydroureter with tiny punctate stones suggested in the distal left ureter. Bladder wall is not thickened. Gas in the bladder may come from instrumentation or infection. Exophytic lesion arising from the right kidney without change since prior study. Hyperdensity likely indicating a hemorrhagic cyst. Stomach/Bowel: Stomach, small bowel, and colon are not abnormally distended. Scattered stool throughout the colon. Scattered colonic diverticula. No evidence of diverticulitis. Prominent stool in the rectum with mild rectal wall thickening may indicate stercoral colitis. Infiltration in the presacral fat is unchanged since prior study, possibly inflammatory or reactive. Vascular/Lymphatic: Aortic atherosclerosis. No enlarged abdominal or pelvic lymph nodes. Reproductive: Status post hysterectomy. No adnexal masses. Other: Small amount of free fluid around the liver and spleen. Likely ascites. Scarring in the anterior abdominal wall consistent with postoperative change. Edema in the subcutaneous fat. No free air in the abdomen. Musculoskeletal: Degenerative changes in the spine. No destructive bone lesions. IMPRESSION: 1. Atelectasis or consolidation in the lung bases,  greater on the left. 2. Left adrenal gland adenoma. 3. Bilateral renal parenchymal atrophy. Punctate stones suggested in the distal left ureter. Mild proximal hydronephrosis and hydroureter. Gas in the bladder may be due to instrumentation or infection. 4. Prominent stool in the rectum with rectal wall thickening suggesting possible stercoral colitis. 5. Small amount of free  fluid in the abdomen and pelvis, likely ascites. 6. Probable hemorrhagic cysts in the right kidney. Aortic Atherosclerosis (ICD10-I70.0). Electronically Signed   By: Lucienne Capers M.D.   On: 11/27/2017 03:10   Ct Head Wo Contrast  Result Date: 11/27/2017 CLINICAL DATA:  Altered mental status. History of hypertension, hepatitis, diabetes, edema. EXAM: CT HEAD WITHOUT CONTRAST TECHNIQUE: Contiguous axial images were obtained from the base of the skull through the vertex without intravenous contrast. COMPARISON:  MRI brain 11/29/2016. CT head 11/24/2016. FINDINGS: Brain: Diffuse cerebral atrophy. Ventricular dilatation consistent with central atrophy. Low-attenuation changes throughout the deep white matter consistent with small vessel ischemia. No mass-effect or midline shift. No abnormal extra-axial fluid collections. Gray-white matter junctions are distinct. Basal cisterns are not effaced. No acute intracranial hemorrhage. Vascular: Moderate intracranial arterial vascular calcifications are present. Skull: Calvarium appears intact. Sinuses/Orbits: Paranasal sinuses and mastoid air cells are clear. Other: None. IMPRESSION: No acute intracranial abnormalities. Chronic atrophy and small vessel ischemic changes. Electronically Signed   By: Lucienne Capers M.D.   On: 11/27/2017 03:02   Dg Chest Port 1 View  Result Date: 11/26/2017 CLINICAL DATA:  Altered mental status EXAM: PORTABLE CHEST 1 VIEW COMPARISON:  11/13/2017 FINDINGS: AP portable semi upright view of the chest. The patient is rotated to the right. Stable cardiomegaly with  interstitial edema. No pulmonary confluence, effusion or pneumothorax. No acute osseous abnormality. IMPRESSION: Cardiomegaly with interstitial edema.  No significant change. Electronically Signed   By: Ashley Royalty M.D.   On: 11/26/2017 22:48     EKG: Independently reviewed.  Sinus rhythm, QTC 475, low voltage, poor R wave progression, nonspecific T wave change.   Assessment/Plan Principal Problem:   Sepsis ([place]) Active Problems:   Depression   HTN (hypertension)   GERD   Chronic anemia   Iron deficiency anemia   CKD (chronic kidney disease), stage IV ([facility])   Acute metabolic encephalopathy   C. difficile diarrhea   Type II diabetes mellitus with renal manifestations ([place])   Sepsis [facility]): Patient admitted critical for sepsis with fever, tachycardia and tachypnea.  Lactic acid is elevated 2.09.  Currently hemodynamically stable.  Source of infection is not clear. Chest x-ray did not show infiltration.  Patient has recent history of C. difficile colitis, but no active nausea, vomiting or diarrhea notated.  CT abdomen/pelvis possible punctate stones in the distal left ureter, mild proximal hydronephrosis and hydroureter and gas in the bladder, indicating possible urinary tract infection, but patient urinalysis negative.  Other differential diagnosis is meningitis.  -will admit to SDU as inpt -start vancomycin, aztreonam and Flagyl empirically -Follow-up of blood culture and urine culture -will get Procalcitonin and trend lactic acid levels per sepsis protocol. -IVF: 2.6 L of NS bolus in ED, followed by 100 cc/h-->bolus as needed (pt already has 1+ leg edema)  -may need to discuss with urology about the CT-findings of punctate stones in the distal left ureter, mild proximal hydronephrosis and hydroureter and gas in the bladder.  Acute metabolic encephalopathy: likely due to sepsis. CT-head negative. -Frequent neuro check - check ammonia - treated sepsis as above -Hold all home oral  medications until mental status improves  Depression: -Hold all home oral medications until mental status improves  HTN (hypertension): -IV hydralazine PRN  GERD -IV pepcid  Iron deficiency anemia: -hold iron supplement  CKD (chronic kidney disease), stage IV ([place]): recent cre 1.6 on 11/8-->1.43 today. BUN 27.  -f/u by BMP  C. difficile diarrhea: no diarrhea currently -observe  Type II diabetes mellitus with renal manifestations ([place]): Last A1c 6.7 on 05/24/17, well controled. Patient is taking Lantus and NovoLog at home -will decrease Lantus dose from 10 to 7 units daily -SSI  Anemia: hemoglobin stable, 10.2 Follow-up with CBC    Inpatient status:  # Patient requires inpatient status due to high intensity of service, high risk for further deterioration and high frequency of surveillance required.  I certify that at the point of admission it is my clinical judgment that the patient will require inpatient hospital care spanning beyond 2 midnights from the point of admission.  . This patient has multiple chronic comorbidities including hypertension, hyperlipidemia, diabetes mellitus, GERD, gout, iron deficiency anemia, diverticulitis, kidney stone, thalassemia minor . Now patient has presenting symptoms include AMS, fever . The worrisome physical exam findings include AMS, abdominal distention, leg edema . The initial radiographic and laboratory data are worrisome because of elevated lactic acid, sepsis. CT scan showed punctate stones in the distal left ureter, proximal hydronephrosis and hydroureter, and gas in the bladder. . Current medical needs: please see my assessment and plan . Predictability of an adverse outcome (risk): Patient has multiple comorbidities, presents with altered mental status, clinically septic.  Source of infection is not clear.  Patient is a high risk for deteriorating.     DVT ppx: SQ Heparin   Code Status:  Per his husband who is POA, pt is  DNR. Family Communication: Yes, patient's husband at bed side Disposition Plan:  Anticipate discharge back to previous rehab facility Consults called:  none Admission status:   SDU/inpation       Date of Service 11/27/2017    Ivor Costa [facility] Pager [phone]  If 7PM-7AM, please contact night-coverage [email or web address] Password [email or web address] 11/27/2017, 4:02 AM

## 2017-11-28 ENCOUNTER — Inpatient Hospital Stay (HOSPITAL_COMMUNITY): Payer: Medicare HMO

## 2017-11-28 DIAGNOSIS — I10 Essential (primary) hypertension: Secondary | ICD-10-CM

## 2017-11-28 DIAGNOSIS — J9602 Acute respiratory failure with hypercapnia: Secondary | ICD-10-CM

## 2017-11-28 LAB — RESPIRATORY PANEL BY PCR
ADENOVIRUS-RVPPCR: NOT DETECTED
BORDETELLA PERTUSSIS-RVPCR: NOT DETECTED
CHLAMYDOPHILA PNEUMONIAE-RVPPCR: NOT DETECTED
CORONAVIRUS HKU1-RVPPCR: NOT DETECTED
CORONAVIRUS NL63-RVPPCR: NOT DETECTED
Coronavirus 229E: NOT DETECTED
Coronavirus OC43: NOT DETECTED
Influenza A: NOT DETECTED
Influenza B: NOT DETECTED
MYCOPLASMA PNEUMONIAE-RVPPCR: NOT DETECTED
Metapneumovirus: NOT DETECTED
Parainfluenza Virus 1: NOT DETECTED
Parainfluenza Virus 2: NOT DETECTED
Parainfluenza Virus 3: NOT DETECTED
Parainfluenza Virus 4: NOT DETECTED
Respiratory Syncytial Virus: NOT DETECTED
Rhinovirus / Enterovirus: NOT DETECTED

## 2017-11-28 LAB — URINE CULTURE: CULTURE: NO GROWTH

## 2017-11-28 LAB — RETICULOCYTES
Immature Retic Fract: 31.4 % — ABNORMAL HIGH (ref 2.3–15.9)
RBC.: 3.65 MIL/uL — AB (ref 3.87–5.11)
RETIC COUNT ABSOLUTE: 65.3 10*3/uL (ref 19.0–186.0)
Retic Ct Pct: 1.8 % (ref 0.4–3.1)

## 2017-11-28 LAB — BLOOD GAS, ARTERIAL
Acid-Base Excess: 2 mmol/L (ref 0.0–2.0)
BICARBONATE: 27.3 mmol/L (ref 20.0–28.0)
Drawn by: 398991
O2 Content: 3 L/min
O2 Saturation: 98.3 %
PATIENT TEMPERATURE: 100.5
PCO2 ART: 55.1 mmHg — AB (ref 32.0–48.0)
PO2 ART: 120 mmHg — AB (ref 83.0–108.0)
pH, Arterial: 7.322 — ABNORMAL LOW (ref 7.350–7.450)

## 2017-11-28 LAB — CBC
HCT: 25.9 % — ABNORMAL LOW (ref 36.0–46.0)
Hemoglobin: 7.6 g/dL — ABNORMAL LOW (ref 12.0–15.0)
MCH: 21.8 pg — ABNORMAL LOW (ref 26.0–34.0)
MCHC: 29.3 g/dL — ABNORMAL LOW (ref 30.0–36.0)
MCV: 74.4 fL — AB (ref 80.0–100.0)
NRBC: 0.5 % — AB (ref 0.0–0.2)
Platelets: 107 10*3/uL — ABNORMAL LOW (ref 150–400)
RBC: 3.48 MIL/uL — AB (ref 3.87–5.11)
RDW: 20.6 % — AB (ref 11.5–15.5)
WBC: 4.4 10*3/uL (ref 4.0–10.5)

## 2017-11-28 LAB — IRON AND TIBC
Iron: 79 ug/dL (ref 28–170)
SATURATION RATIOS: 37 % — AB (ref 10.4–31.8)
TIBC: 213 ug/dL — ABNORMAL LOW (ref 250–450)
UIBC: 134 ug/dL

## 2017-11-28 LAB — FERRITIN: FERRITIN: 895 ng/mL — AB (ref 11–307)

## 2017-11-28 LAB — BASIC METABOLIC PANEL
ANION GAP: 10 (ref 5–15)
BUN: 35 mg/dL — ABNORMAL HIGH (ref 8–23)
CALCIUM: 7.6 mg/dL — AB (ref 8.9–10.3)
CO2: 25 mmol/L (ref 22–32)
Chloride: 108 mmol/L (ref 98–111)
Creatinine, Ser: 1.73 mg/dL — ABNORMAL HIGH (ref 0.44–1.00)
GFR calc non Af Amer: 26 mL/min — ABNORMAL LOW (ref >60)
GFR, EST AFRICAN AMERICAN: 30 mL/min — AB (ref >60)
GLUCOSE: 205 mg/dL — AB (ref 70–99)
POTASSIUM: 3 mmol/L — AB (ref 3.5–5.1)
Sodium: 143 mmol/L (ref 135–145)

## 2017-11-28 LAB — VITAMIN B12: Vitamin B-12: 523 pg/mL (ref 180–914)

## 2017-11-28 LAB — FOLATE: Folate: 34.8 ng/mL (ref >5.9)

## 2017-11-28 LAB — GLUCOSE, CAPILLARY
GLUCOSE-CAPILLARY: 129 mg/dL — AB (ref 70–99)
Glucose-Capillary: 160 mg/dL — ABNORMAL HIGH (ref 70–99)
Glucose-Capillary: 168 mg/dL — ABNORMAL HIGH (ref 70–99)
Glucose-Capillary: 169 mg/dL — ABNORMAL HIGH (ref 70–99)

## 2017-11-28 MED ORDER — SODIUM CHLORIDE 0.9 % IV BOLUS
500.0000 mL | Freq: Once | INTRAVENOUS | Status: AC
  Administered 2017-11-28: 500 mL via INTRAVENOUS

## 2017-11-28 MED ORDER — POTASSIUM CHLORIDE 10 MEQ/100ML IV SOLN
10.0000 meq | INTRAVENOUS | Status: AC
  Administered 2017-11-28 (×6): 10 meq via INTRAVENOUS
  Filled 2017-11-28 (×4): qty 100

## 2017-11-28 MED ORDER — VANCOMYCIN HCL 10 G IV SOLR: 1500.0000 mg | INTRAVENOUS | Status: DC

## 2017-11-28 MED ORDER — CHLORHEXIDINE GLUCONATE 0.12 % MT SOLN
15.0000 mL | Freq: Two times a day (BID) | OROMUCOSAL | Status: DC
  Administered 2017-11-28 – 2017-12-06 (×16): 15 mL via OROMUCOSAL
  Filled 2017-11-28 (×15): qty 15

## 2017-11-28 MED ORDER — SODIUM CHLORIDE 0.9 % IV SOLN
INTRAVENOUS | Status: DC | PRN
  Administered 2017-11-28: 1000 mL via INTRAVENOUS

## 2017-11-28 MED ORDER — ORAL CARE MOUTH RINSE
15.0000 mL | Freq: Two times a day (BID) | OROMUCOSAL | Status: DC
  Administered 2017-11-28 – 2017-12-05 (×10): 15 mL via OROMUCOSAL

## 2017-11-28 MED ORDER — SODIUM CHLORIDE 0.9 % IV SOLN
1.0000 g | INTRAVENOUS | Status: DC
  Administered 2017-11-28 – 2017-11-29 (×2): 1 g via INTRAVENOUS
  Filled 2017-11-28 (×3): qty 1

## 2017-11-28 NOTE — Progress Notes (Signed)
PROGRESS NOTE                                                                                                                                                                                                             Patient Demographics:    Marilin Kofman, is a 84 y.o. female, DOB - 04/15/1931, QHU:[id number]  Admit date - 11/26/2017   Admitting Physician Ivor Costa, MD  Outpatient Primary MD for the patient is [name], MD  LOS - 1   Chief Complaint  Patient presents with  . Altered Mental Status       Brief Narrative     84 y.o. female with medical history significant of hypertension, hyperlipidemia, diabetes mellitus, GERD, gout, iron deficiency anemia, diverticulitis, kidney stone, thalassemia minor, with recent hospitalization from 10/22-11/8 due to septic shock which was possibly due to C. difficile colitis. Pt also had worsening renal function and required CRRT in ICU. Her kidney function has improved.  Her creatinine was 1.6 on 11/8. Pt was discharged on Lasix 40 mg daily to nursing home for rehab as stable condition. -Patient presents with confusion, fever, she did have episode of seizures as well, and decompensated respiratory failure where she is currently on BiPAP.   Subjective:    Errica Dutil today is nonverbal, cannot provide any complaints, husband at bedside, denies any significant events.   Assessment  & Plan :    Principal Problem:   Sepsis ([place]) Active Problems:   Depression   HTN (hypertension)   GERD   Chronic anemia   Iron deficiency anemia   CKD (chronic kidney disease), stage IV (HCC)   Acute metabolic encephalopathy   C. difficile diarrhea   Type II diabetes mellitus with renal manifestations ([place])  SIRS -Patient presents with fever, tachycardia and tachypnea, and elevated lactic acid, no source of infection currently could be identified . -  CT abdomen/pelvis possible punctate stones in the distal  left ureter, mild proximal hydronephrosis and hydroureter and gas in the bladder, indicating possible urinary tract infection, but patient urinalysis negative.  -Continue with broad-spectrum antibiotic coverage currently vancomycin, cefepime and Flagyl pending further work-up of her sepsis.  Acute hypercapnic respiratory failure -Patient with increased work of breathing, required BiPAP 11/27/2017, ABG showing PCO2 of 55, saturating well, will try off BiPAP.  Seizures -neurology on board, continue with  Keppra  Acute metabolic encephalopathy:  -CT head negative, patient not back to baseline, ammonia within normal level .  Depression: -Hold all home oral medications until mental status improves  HTN (hypertension): -IV hydralazine PRN  GERD -IV pepcid  Iron deficiency anemia: -hold iron supplement  CKD (chronic kidney disease), stage IV ([place]): recent cre 1.6 on 11/8-->1.43 today. BUN 27.  -f/u by BMP  C. difficile diarrhea: no diarrhea currently -observe  Type II diabetes mellitus with renal manifestations ([place]): Last A1c 6.7 on 05/24/17, well controled. Patient is taking Lantus and NovoLog at home -will decrease Lantus dose from 10 to 7 units daily -SSI  Anemia , unspecified -Obtain anemia panel, known history of thalassemia minor  Hypokalemia -Potassium of 3 today, will replete, recheck in a.m.   Code Status : DNR  Family Communication  : Discussed with husband at bedside  Disposition Plan  : pending further work up  Consults  :  PCCM  Procedures  : neurology  DVT Prophylaxis  :  [place] lovenox  Lab Results  Component Value Date   PLT 107 (L) 11/28/2017    Antibiotics  :    Anti-infectives (From admission, onward)   Start     Dose/Rate Route Frequency Ordered Stop   11/28/17 0000  vancomycin (VANCOCIN) 1,250 mg in sodium chloride 0.9 % 250 mL IVPB     1,250 mg 166.7 mL/hr over 90 Minutes Intravenous Every 24 hours 11/27/17 0159     11/27/17 1800   ceFEPIme (MAXIPIME) 2 g in sodium chloride 0.9 % 100 mL IVPB     2 g 200 mL/hr over 30 Minutes Intravenous Every 24 hours 11/27/17 1502     11/27/17 0200  metroNIDAZOLE (FLAGYL) IVPB 500 mg     500 mg 100 mL/hr over 60 Minutes Intravenous Every 8 hours 11/27/17 0126     11/27/17 0200  aztreonam (AZACTAM) 1 g in sodium chloride 0.9 % 100 mL IVPB  Status:  Discontinued     1 g 200 mL/hr over 30 Minutes Intravenous Every 8 hours 11/27/17 0159 11/27/17 1502   11/27/17 0200  vancomycin (VANCOCIN) 2,000 mg in sodium chloride 0.9 % 500 mL IVPB     2,000 mg 250 mL/hr over 120 Minutes Intravenous  Once 11/27/17 0159 11/27/17 0606   11/26/17 2200  cefTRIAXone (ROCEPHIN) 2 g in sodium chloride 0.9 % 100 mL IVPB  Status:  Discontinued     2 g 200 mL/hr over 30 Minutes Intravenous Every 24 hours 11/26/17 2140 11/27/17 0125        Objective:   Vitals:   11/28/17 0608 11/28/17 0800 11/28/17 0804 11/28/17 0805  BP:    (!) 128/49  Pulse: 70   78  Resp:    18  Temp:  (!) 100.5 F (38.1 C)    TempSrc:  Axillary    SpO2: 100%  100% 100%    Wt Readings from Last 3 Encounters:  11/22/17 109.8 kg  11/19/17 109.8 kg  11/16/17 109.8 kg     Intake/Output Summary (Last 24 hours) at 11/28/2017 1101 Last data filed at 11/28/2017 1052 Gross per 24 hour  Intake 2627.74 ml  Output 1553 ml  Net 1074.74 ml     Physical Exam  Patient is somnolent, and BiPAP, in no apparent distress, moaning to sternal rub, otherwise somnolent .Marland Kitchen  Symmetrical Chest wall movement, diminished air entry at the bases, with rhonchi, no wheezing  RRR,No Gallops,Rubs or new Murmurs, No Parasternal Heave +ve B.Sounds, Abd Soft,  No tenderness, , No rebound - guarding or rigidity. No Cyanosis, Clubbing, chronic lower extremity skin changes, with mild edema.    Data Review:    CBC Recent Labs  Lab 11/26/17 2152 11/27/17 0327 11/28/17 0327  WBC 7.2 8.3 4.4  HGB 10.2* 9.5* 7.6*  HCT 33.4* 33.0* 25.9*  PLT 160 160  107*  MCV 73.1* 74.0* 74.4*  MCH 22.3* 21.3* 21.8*  MCHC 30.5 28.8* 29.3*  RDW 20.0* 20.1* 20.6*  LYMPHSABS 0.6*  --   --   MONOABS 0.2  --   --   EOSABS 0.0  --   --   BASOSABS 0.0  --   --     Chemistries  Recent Labs  Lab 11/26/17 2152 11/27/17 0327 11/28/17 0327  NA 138 139 143  K 4.2 4.0 3.0*  CL 96* 98 108  CO2 30 26 25   GLUCOSE 300* 340* 205*  BUN 27* 26* 35*  CREATININE 1.43* 1.55* 1.73*  CALCIUM 9.3 8.8* 7.6*  AST 29  --   --   ALT 21  --   --   ALKPHOS 137*  --   --   BILITOT 1.0  --   --    ------------------------------------------------------------------------------------------------------------------ No results for input(s): CHOL, HDL, LDLCALC, TRIG, CHOLHDL, LDLDIRECT in the last 72 hours.  Lab Results  Component Value Date   HGBA1C 6.7 (H) 05/24/2017   ------------------------------------------------------------------------------------------------------------------ No results for input(s): TSH, T4TOTAL, T3FREE, THYROIDAB in the last 72 hours.  Invalid input(s): FREET3 ------------------------------------------------------------------------------------------------------------------ No results for input(s): VITAMINB12, FOLATE, FERRITIN, TIBC, IRON, RETICCTPCT in the last 72 hours.  Coagulation profile Recent Labs  Lab 11/27/17 0327  INR 1.20    No results for input(s): DDIMER in the last 72 hours.  Cardiac Enzymes No results for input(s): CKMB, TROPONINI, MYOGLOBIN in the last 168 hours.  Invalid input(s): CK ------------------------------------------------------------------------------------------------------------------    Component Value Date/Time   BNP 859.9 (H) 11/27/2017 0327   BNP 18.5 08/14/2012 0930    Inpatient Medications  Scheduled Meds: . heparin  5,000 Units Subcutaneous Q8H  . insulin aspart  0-5 Units Subcutaneous QHS  . insulin aspart  0-9 Units Subcutaneous TID WC  . insulin glargine  7 Units Subcutaneous QHS  .  ipratropium  0.5 mg Nebulization Q6H  . levalbuterol  1.25 mg Nebulization Q6H   Continuous Infusions: . sodium chloride 1,000 mL (11/28/17 0745)  . ceFEPime (MAXIPIME) IV Stopped (11/27/17 1926)  . famotidine (PEPCID) IV Stopped (11/27/17 2354)  . levETIRAcetam Stopped (11/28/17 0339)  . metronidazole 500 mg (11/28/17 1032)  . potassium chloride 10 mEq (11/28/17 1030)  . vancomycin Stopped (11/28/17 0140)   PRN Meds:.sodium chloride, acetaminophen **OR** acetaminophen, hydrALAZINE, LORazepam, ondansetron **OR** ondansetron (ZOFRAN) IV  Micro Results Recent Results (from the past 240 hour(s))  Blood Culture (routine x 2)     Status: None (Preliminary result)   Collection Time: 11/26/17  9:52 PM  Result Value Ref Range Status   Specimen Description BLOOD RIGHT HAND  Final   Special Requests   Final    BOTTLES DRAWN AEROBIC AND ANAEROBIC Blood Culture adequate volume   Culture   Final    NO GROWTH < 24 HOURS Performed at [facility], [place] [address], [place], [place] [address]    Report Status PENDING  Incomplete  Urine culture     Status: None   Collection Time: 11/26/17 10:37 PM  Result Value Ref Range Status   Specimen Description URINE, CATHETERIZED  Final  Special Requests NONE  Final   Culture   Final    NO GROWTH Performed at [facility], [place] [address] [place], [place] [address]    Report Status 11/28/2017 FINAL  Final    Radiology Reports Ct Abdomen Pelvis Wo Contrast  Result Date: 11/27/2017 CLINICAL DATA:  Abdominal distention, altered mental status, sepsis. History of Clostridium difficile colitis, open wound of the abdominal wall, nephrolithiasis, hernia, gastroesophageal reflux disease, diverticulitis, colon polyp, cellulitis and abscess, adrenal adenoma, ventral hernia repair, hysterectomy, sigmoid resection, cholecystectomy, appendectomy. EXAM: CT ABDOMEN AND PELVIS WITHOUT CONTRAST TECHNIQUE: Multidetector CT imaging of the abdomen and pelvis  was performed following the standard protocol without IV contrast. COMPARISON:  10/30/2017 FINDINGS: Lower chest: Evaluation is limited due to motion artifact. Atelectasis or consolidation in the lung bases, greater on the left. Coronary artery calcifications. Calcification in the mitral valve annulus. Hepatobiliary: No focal liver abnormality is seen. Status post cholecystectomy. No biliary dilatation. Pancreas: Fatty infiltration of the pancreas. Spleen: Normal in size without focal abnormality. Adrenals/Urinary Tract: 3.1 cm diameter left adrenal gland nodule. No change since previous study. Fat attenuation consistent with benign adenoma. Bilateral renal parenchymal atrophy. Mild left hydronephrosis and hydroureter with tiny punctate stones suggested in the distal left ureter. Bladder wall is not thickened. Gas in the bladder may come from instrumentation or infection. Exophytic lesion arising from the right kidney without change since prior study. Hyperdensity likely indicating a hemorrhagic cyst. Stomach/Bowel: Stomach, small bowel, and colon are not abnormally distended. Scattered stool throughout the colon. Scattered colonic diverticula. No evidence of diverticulitis. Prominent stool in the rectum with mild rectal wall thickening may indicate stercoral colitis. Infiltration in the presacral fat is unchanged since prior study, possibly inflammatory or reactive. Vascular/Lymphatic: Aortic atherosclerosis. No enlarged abdominal or pelvic lymph nodes. Reproductive: Status post hysterectomy. No adnexal masses. Other: Small amount of free fluid around the liver and spleen. Likely ascites. Scarring in the anterior abdominal wall consistent with postoperative change. Edema in the subcutaneous fat. No free air in the abdomen. Musculoskeletal: Degenerative changes in the spine. No destructive bone lesions. IMPRESSION: 1. Atelectasis or consolidation in the lung bases, greater on the left. 2. Left adrenal gland adenoma.  3. Bilateral renal parenchymal atrophy. Punctate stones suggested in the distal left ureter. Mild proximal hydronephrosis and hydroureter. Gas in the bladder may be due to instrumentation or infection. 4. Prominent stool in the rectum with rectal wall thickening suggesting possible stercoral colitis. 5. Small amount of free fluid in the abdomen and pelvis, likely ascites. 6. Probable hemorrhagic cysts in the right kidney. Aortic Atherosclerosis (ICD10-I70.0). Electronically Signed   By: Lucienne Capers M.D.   On: 11/27/2017 03:10   Ct Abdomen Pelvis Wo Contrast  Result Date: 10/30/2017 CLINICAL DATA:  Abdominal distension, diarrhea for 5 days, blood in diarrhea, unable to control bladder or bowel, history of adrenal adenoma, diabetes mellitus, hypertension, nephrolithiasis, renal failure EXAM: CT ABDOMEN AND PELVIS WITHOUT CONTRAST TECHNIQUE: Multidetector CT imaging of the abdomen and pelvis was performed following the standard protocol without IV contrast. Sagittal and coronal MPR images reconstructed from axial data set. Patient drank dilute oral contrast for exam COMPARISON:  05/24/2017 FINDINGS: Lower chest: Bibasilar atelectasis and peribronchial thickening Hepatobiliary: Gallbladder surgically absent. No focal hepatic abnormalities Pancreas: Pancreatic atrophy without mass Spleen: Normal appearance Adrenals/Urinary Tract: Low-attenuation LEFT adrenal mass 3.1 x 2.6 cm consistent with adenoma. RIGHT adrenal gland unremarkable. BILATERAL renal cortical atrophy. Exophytic intermediate attenuation mass lateral aspect of inferior RIGHT kidney,  3.7 x 3.6 cm image 56. Minimal prominence of LEFT renal pelvis and LEFT ureter in the pelvis without obstructing calculus. RIGHT ureter and bladder normal appearance. Stomach/Bowel: Appendix surgically absent by history. Scattered colonic diverticulosis. Wall thickening of sigmoid colon with mild pericolic infiltrative changes. Additional wall thickening at the  rectosigmoid colon. Findings could represent acute diverticulitis or colitis. No abscess or extraluminal gas. Stomach and small bowel loops unremarkable. Vascular/Lymphatic: Atherosclerotic calcifications aorta and iliac arteries. Coronary arterial calcifications. Aorta normal caliber. No adenopathy. Reproductive: Uterus surgically absent.  Normal appearing ovaries. Other: No free air or free fluid. Tiny LEFT parasagittal ventral hernia inferior to the umbilicus. Musculoskeletal: Demineralized with degenerative disc and facet disease changes lumbar spine. IMPRESSION: Diffuse colonic diverticulosis. Wall thickening of the rectosigmoid colon with mild pericolic infiltrative changes, question diverticulitis versus colitis. No evidence of perforation or abscess. LEFT adrenal adenoma 3.1 x 2.6 cm. Grossly unchanged appearance of an exophytic mass 3.7 x 3.6 cm at the lateral RIGHT kidney, question complicated cyst; lesion is unchanged since 05/24/2017 but minimally larger than seen on 02/10/2015 when it measured 3.5 x 3.2 cm, recommended attention on follow-up imaging to establish long-term stability. Electronically Signed   By: Lavonia Dana M.D.   On: 10/30/2017 13:03   Dg Abd 1 View  Result Date: 11/09/2017 CLINICAL DATA:  Patient pulled out nasogastric catheter EXAM: ABDOMEN - 1 VIEW COMPARISON:  11/07/2017 FINDINGS: Scattered large and small bowel gas is noted. The previously seen nasogastric catheter has been removed in the interval. Despite the given clinical history of metal tipped catheter this catheter on previous images represented a standard nasogastric catheter without metallic weighting. No foreign body is noted. IMPRESSION: No evidence of retained foreign body as the prior nasogastric catheter was not a weighted tipped catheter based on previous images. Electronically Signed   By: Inez Catalina M.D.   On: 11/09/2017 19:57   Dg Abd 1 View  Result Date: 11/07/2017 CLINICAL DATA:  84 year old female with  feeding tube placement. EXAM: ABDOMEN - 1 VIEW COMPARISON:  Earlier abdominal radiograph dated 11/07/2017 FINDINGS: No significant change in the appearance or positioning of the enteric tube which appears somewhat folded, likely in the mid to distal stomach. IMPRESSION: No significant interval change. Electronically Signed   By: Anner Crete M.D.   On: 11/07/2017 23:00   Dg Abd 1 View  Result Date: 11/03/2017 CLINICAL DATA:  Abdominal distension. EXAM: ABDOMEN - 1 VIEW COMPARISON:  CT, 10/30/2017 FINDINGS: Normal bowel gas pattern with no evidence of obstruction. Soft tissues are not well-defined due to body habitus. No convincing renal or ureteral stones. Status post cholecystectomy. No acute skeletal abnormality. IMPRESSION: 1. No acute findings.  No evidence of bowel obstruction. Electronically Signed   By: Lajean Manes M.D.   On: 11/03/2017 11:17   Ct Head Wo Contrast  Result Date: 11/27/2017 CLINICAL DATA:  Altered mental status. History of hypertension, hepatitis, diabetes, edema. EXAM: CT HEAD WITHOUT CONTRAST TECHNIQUE: Contiguous axial images were obtained from the base of the skull through the vertex without intravenous contrast. COMPARISON:  MRI brain 11/29/2016. CT head 11/24/2016. FINDINGS: Brain: Diffuse cerebral atrophy. Ventricular dilatation consistent with central atrophy. Low-attenuation changes throughout the deep white matter consistent with small vessel ischemia. No mass-effect or midline shift. No abnormal extra-axial fluid collections. Gray-white matter junctions are distinct. Basal cisterns are not effaced. No acute intracranial hemorrhage. Vascular: Moderate intracranial arterial vascular calcifications are present. Skull: Calvarium appears intact. Sinuses/Orbits: Paranasal sinuses and mastoid air cells are  clear. Other: None. IMPRESSION: No acute intracranial abnormalities. Chronic atrophy and small vessel ischemic changes. Electronically Signed   By: Lucienne Capers M.D.    On: 11/27/2017 03:02   Ir Fluoro Guide Cv Line Right  Result Date: 11/03/2017 INDICATION: End-stage renal disease. In need intravenous access for the initiation of hemodialysis. EXAM: NON-TUNNELED CENTRAL VENOUS HEMODIALYSIS CATHETER PLACEMENT WITH ULTRASOUND AND FLUOROSCOPIC GUIDANCE COMPARISON:  Chest radiograph - 10/31/2017 MEDICATIONS: None FLUOROSCOPY TIME:  24 seconds (14 mGy) COMPLICATIONS: None immediate. PROCEDURE: Informed written consent was obtained from patient's family after a discussion of the risks, benefits, and alternatives to treatment. Questions regarding the procedure were encouraged and answered. The right neck and chest were prepped with chlorhexidine in a sterile fashion, and a sterile drape was applied covering the operative field. Maximum barrier sterile technique with sterile gowns and gloves were used for the procedure. A timeout was performed prior to the initiation of the procedure. After the overlying soft tissues were anesthetized, a small venotomy incision was created and a micropuncture kit was utilized to access the internal jugular vein. Real-time ultrasound guidance was utilized for vascular access including the acquisition of a permanent ultrasound image documenting patency of the accessed vessel. The microwire was utilized to measure appropriate catheter length. A stiff glidewire was advanced to the level of the IVC. Under fluoroscopic guidance, the venotomy was serially dilated, ultimately allowing placement of a 20 cm temporary Trialysis catheter with tip ultimately terminating within the superior aspect of the right atrium. Final catheter positioning was confirmed and documented with a spot radiographic image. The catheter aspirates and flushes normally. The catheter was flushed with appropriate volume heparin dwells. The catheter exit site was secured with a 0-Prolene retention suture. A dressing was placed. The patient tolerated the procedure well without immediate  post procedural complication. IMPRESSION: Successful placement of a right internal jugular approach 20 cm temporary dialysis catheter with tip terminating with in the superior aspect of the right atrium. The catheter is ready for immediate use. PLAN: This catheter may be converted to a tunneled dialysis catheter at a later date as indicated. Electronically Signed   By: Sandi Mariscal M.D.   On: 11/03/2017 18:01   Ir US Guide Vasc Access Right  Result Date: 11/03/2017 INDICATION: End-stage renal disease. In need intravenous access for the initiation of hemodialysis. EXAM: NON-TUNNELED CENTRAL VENOUS HEMODIALYSIS CATHETER PLACEMENT WITH ULTRASOUND AND FLUOROSCOPIC GUIDANCE COMPARISON:  Chest radiograph - 10/31/2017 MEDICATIONS: None FLUOROSCOPY TIME:  24 seconds (14 mGy) COMPLICATIONS: None immediate. PROCEDURE: Informed written consent was obtained from patient's family after a discussion of the risks, benefits, and alternatives to treatment. Questions regarding the procedure were encouraged and answered. The right neck and chest were prepped with chlorhexidine in a sterile fashion, and a sterile drape was applied covering the operative field. Maximum barrier sterile technique with sterile gowns and gloves were used for the procedure. A timeout was performed prior to the initiation of the procedure. After the overlying soft tissues were anesthetized, a small venotomy incision was created and a micropuncture kit was utilized to access the internal jugular vein. Real-time ultrasound guidance was utilized for vascular access including the acquisition of a permanent ultrasound image documenting patency of the accessed vessel. The microwire was utilized to measure appropriate catheter length. A stiff glidewire was advanced to the level of the IVC. Under fluoroscopic guidance, the venotomy was serially dilated, ultimately allowing placement of a 20 cm temporary Trialysis catheter with tip ultimately terminating within  the superior  aspect of the right atrium. Final catheter positioning was confirmed and documented with a spot radiographic image. The catheter aspirates and flushes normally. The catheter was flushed with appropriate volume heparin dwells. The catheter exit site was secured with a 0-Prolene retention suture. A dressing was placed. The patient tolerated the procedure well without immediate post procedural complication. IMPRESSION: Successful placement of a right internal jugular approach 20 cm temporary dialysis catheter with tip terminating with in the superior aspect of the right atrium. The catheter is ready for immediate use. PLAN: This catheter may be converted to a tunneled dialysis catheter at a later date as indicated. Electronically Signed   By: Sandi Mariscal M.D.   On: 11/03/2017 18:01   Dg Chest Port 1 View  Result Date: 11/26/2017 CLINICAL DATA:  Altered mental status EXAM: PORTABLE CHEST 1 VIEW COMPARISON:  11/13/2017 FINDINGS: AP portable semi upright view of the chest. The patient is rotated to the right. Stable cardiomegaly with interstitial edema. No pulmonary confluence, effusion or pneumothorax. No acute osseous abnormality. IMPRESSION: Cardiomegaly with interstitial edema.  No significant change. Electronically Signed   By: Ashley Royalty M.D.   On: 11/26/2017 22:48   Dg Chest Port 1 View  Result Date: 11/13/2017 CLINICAL DATA:  Shortness of breath EXAM: PORTABLE CHEST 1 VIEW COMPARISON:  11/09/2017 FINDINGS: Cardiomegaly and slightly increased pulmonary vascular congestion noted. Mild bibasilar opacities/atelectasis noted in this low volume film. A RIGHT central venous catheter with tips overlying the LOWER SVC/SUPERIOR cavoatrial junction noted. No pneumothorax. IMPRESSION: Cardiomegaly with pulmonary vascular congestion and mild bibasilar opacities/atelectasis. Electronically Signed   By: Margarette Canada M.D.   On: 11/13/2017 07:49   Dg Chest Port 1 View  Result Date: 11/09/2017 CLINICAL  DATA:  Patient removed recent nasogastric catheter EXAM: PORTABLE CHEST 1 VIEW COMPARISON:  11/08/2017 FINDINGS: Cardiac shadow is enlarged. Right jugular central line is again seen and stable. Inspiratory effort is poor with crowding of the vascular markings. No focal confluent infiltrate is seen. No radiopaque foreign body is noted. The previously seen nasogastric catheter has been removed and as previously described was not a weighted tipped catheter. IMPRESSION: Poor inspiratory effort.  No acute abnormality noted. Electronically Signed   By: Inez Catalina M.D.   On: 11/09/2017 19:58   Dg Chest Port 1 View  Result Date: 11/08/2017 CLINICAL DATA:  Respiratory failure. EXAM: PORTABLE CHEST 1 VIEW COMPARISON:  11/07/2017 and older exams. FINDINGS: Mild enlargement of the cardiopericardial silhouette, stable. Prominent bronchovascular markings, with additional lung base opacity, the latter consistent with atelectasis, also without change from the previous day's exam. New nasal/orogastric tube passes below the included field of view, likely into the stomach. Right subclavian dual lumen central venous catheter is stable, tip in the right atrium. No pneumothorax. IMPRESSION: 1. No significant change in lung aeration from the previous day's study. There are prominent bronchovascular markings and lung base atelectasis, but no convincing pulmonary edema or pneumonia. 2. New nasal/orogastric tube. Tip not visualized, but likely extends into the stomach. Electronically Signed   By: Lajean Manes M.D.   On: 11/08/2017 07:13   Dg Chest Port 1 View  Result Date: 11/07/2017 CLINICAL DATA:  Acute respiratory failure with hypoxia. EXAM: PORTABLE CHEST 1 VIEW COMPARISON:  10/31/2017. FINDINGS: Cardiomegaly. Improved lung volumes. Increased perihilar markings could represent early edema or viral pneumonitis. Other than improved lung markings, similar appearance to priors. Dialysis catheter has been inserted via RIGHT IJ  approach. Tip slide in the RIGHT atrium. There is  no pneumothorax. IMPRESSION: Satisfactory catheter placement. No significant change in the appearance of the lungs except for improved volume. Cardiomegaly. Electronically Signed   By: Staci Righter M.D.   On: 11/07/2017 11:30   Dg Chest Port 1 View  Result Date: 10/31/2017 CLINICAL DATA:  84 year old female with shortness of breath. EXAM: PORTABLE CHEST 1 VIEW COMPARISON:  Portable chest 10/30/2017 and earlier. CT Abdomen and Pelvis 10/30/2017. FINDINGS: There did not appear to be overt pulmonary edema at the lung bases yesterday by CT. Lower lobe bronchiectasis and peribronchial thickening was noted. Continued low lung volumes stable appearance of the pulmonary interstitium today from May this year. Stable mild cardiomegaly. Visualized tracheal air column is within normal limits. No pneumothorax, pleural effusion or confluent opacity. Large body habitus. No acute osseous abnormality identified. IMPRESSION: Persistent low lung volumes. No focal pulmonary abnormality. Consider viral or atypical respiratory infection given the appearance of the lung bases on CT Abdomen and Pelvis yesterday. Electronically Signed   By: Genevie Ann M.D.   On: 10/31/2017 13:18   Dg Chest Port 1 View  Result Date: 10/30/2017 CLINICAL DATA:  84 year old female with constipation and watery diarrhea. Initial encounter. EXAM: PORTABLE CHEST 1 VIEW COMPARISON:  05/27/2017. FINDINGS: Cardiomegaly. Asymmetric mild pulmonary edema superimposed upon chronic changes. Calcified aorta. IMPRESSION: 1. Asymmetric mild pulmonary edema. 2. Cardiomegaly. 3.  Aortic Atherosclerosis (ICD10-I70.0). Electronically Signed   By: Genia Del M.D.   On: 10/30/2017 15:06   Dg Abd Portable 1v  Result Date: 11/07/2017 CLINICAL DATA:  Nasogastric tube placement. EXAM: PORTABLE ABDOMEN - 1 VIEW COMPARISON:  11/03/2017 FINDINGS: Interval nasogastric tube folded back upon itself in the mid to distal stomach  with its tip in the mid stomach. The included bowel gas pattern is normal. Prominent interstitial markings at the left lung base. Lower thoracic spine degenerative changes. IMPRESSION: Nasogastric tube folded back upon itself in the mid to distal stomach with its tip in the mid stomach. Electronically Signed   By: Claudie Revering M.D.   On: 11/07/2017 13:13     Phillips Climes M.D on 11/28/2017 at 11:01 AM  Between 7am to 7pm - Pager - [phone]  After 7pm go to [email or web address] - password [email or web address]  [facility] -  Office  [phone]

## 2017-11-28 NOTE — Progress Notes (Signed)
Pharmacy Antibiotic Note  Lauren Crosby is a 84 y.o. female admitted on 11/26/2017 with sepsis.  Pharmacy has been consulted for vancomycin and cefepime dosing, broad spectrum antibiotics Tmax = 103.3, afebrile,  PCT nml, lactate nml. Scr trending up, CrCl 28 ml/min , wt 109 kg  - I will adjust vanc/cefepime dose for wosening renal function.   -Recent hospitalized 10/22-11/8 d/t septic shock possibly 2/2 C diff colitis , had worsening rena function, required CRRT in ICU.   Vancomycin 11/19>> Aztreonam 11/19>11/19 Cefepime 11/19>> Metronidazole 11/19>>  11/19 resp PCR: sent 11/18 BCx x2 (LUE) ngtd  11/18 UCx: negative 11/18 BCx x2 (R hand): ngtd x2   Plan: Decrease Vancomycin dose to 1500 mg IV q48 hour Decrease Cefepime to 1 g IV q24h  Monitor clinical status, renal function and culture results daily.  Vancomycin trough at steady state.      Temp (24hrs), Avg:99.1 F (37.3 C), Min:98.1 F (36.7 C), Max:100.5 F (38.1 C)  Recent Labs  Lab 11/26/17 2152 11/26/17 2201 11/27/17 0104 11/27/17 0327 11/28/17 0327  WBC 7.2  --   --  8.3 4.4  CREATININE 1.43*  --   --  1.55* 1.73*  LATICACIDVEN  --  1.65 2.09* 1.7  --     Estimated Creatinine Clearance: 28.3 mL/min (A) (by C-G formula based on SCr of 1.73 mg/dL (H)).    Allergies  Allergen Reactions  . Penicillins Other (See Comments)    Unknown reaction Has patient had a PCN reaction causing immediate rash, facial/tongue/throat swelling, SOB or lightheadedness with hypotension: No Has patient had a PCN reaction causing severe rash involving mucus membranes or skin necrosis: No Has patient had a PCN reaction that required hospitalization: No Has patient had a PCN reaction occurring within the last 10 years: Unknown If all of the above answers are "NO", then may proceed with Cephalosporin use. Have received ceftriaxone     Thank you for allowing pharmacy to be a part of this patient's care.  Nicole Cella,  RPh Clinical Pharmacist Please check AMION for all [facility] phone numbers After 10:00 PM, call [place] [phone] 11/28/2017 2:25 PM

## 2017-11-29 ENCOUNTER — Inpatient Hospital Stay (HOSPITAL_COMMUNITY): Payer: Medicare HMO

## 2017-11-29 ENCOUNTER — Encounter (HOSPITAL_COMMUNITY): Payer: Self-pay | Admitting: Primary Care

## 2017-11-29 DIAGNOSIS — Z515 Encounter for palliative care: Secondary | ICD-10-CM

## 2017-11-29 DIAGNOSIS — A419 Sepsis, unspecified organism: Principal | ICD-10-CM

## 2017-11-29 DIAGNOSIS — R6521 Severe sepsis with septic shock: Secondary | ICD-10-CM

## 2017-11-29 DIAGNOSIS — G9341 Metabolic encephalopathy: Secondary | ICD-10-CM

## 2017-11-29 DIAGNOSIS — Z7189 Other specified counseling: Secondary | ICD-10-CM

## 2017-11-29 DIAGNOSIS — N170 Acute kidney failure with tubular necrosis: Secondary | ICD-10-CM

## 2017-11-29 LAB — BASIC METABOLIC PANEL
Anion gap: 7 (ref 5–15)
BUN: 37 mg/dL — ABNORMAL HIGH (ref 8–23)
CALCIUM: 7.7 mg/dL — AB (ref 8.9–10.3)
CO2: 26 mmol/L (ref 22–32)
CREATININE: 1.56 mg/dL — AB (ref 0.44–1.00)
Chloride: 111 mmol/L (ref 98–111)
GFR calc Af Amer: 34 mL/min — ABNORMAL LOW (ref >60)
GFR calc non Af Amer: 29 mL/min — ABNORMAL LOW (ref >60)
GLUCOSE: 137 mg/dL — AB (ref 70–99)
Potassium: 3.7 mmol/L (ref 3.5–5.1)
Sodium: 144 mmol/L (ref 135–145)

## 2017-11-29 LAB — BLOOD GAS, ARTERIAL
ACID-BASE EXCESS: 1.8 mmol/L (ref 0.0–2.0)
BICARBONATE: 26.5 mmol/L (ref 20.0–28.0)
DELIVERY SYSTEMS: POSITIVE
Drawn by: 40661
EXPIRATORY PAP: 8
FIO2: 40
Inspiratory PAP: 18
LHR: 12 {breaths}/min
O2 Saturation: 99.5 %
PH ART: 7.367 (ref 7.350–7.450)
Patient temperature: 98.6
pCO2 arterial: 47.4 mmHg (ref 32.0–48.0)
pO2, Arterial: 196 mmHg — ABNORMAL HIGH (ref 83.0–108.0)

## 2017-11-29 LAB — GLUCOSE, CAPILLARY
GLUCOSE-CAPILLARY: 140 mg/dL — AB (ref 70–99)
Glucose-Capillary: 141 mg/dL — ABNORMAL HIGH (ref 70–99)
Glucose-Capillary: 128 mg/dL — ABNORMAL HIGH (ref 70–99)

## 2017-11-29 LAB — CBC
HEMATOCRIT: 27 % — AB (ref 36.0–46.0)
Hemoglobin: 8 g/dL — ABNORMAL LOW (ref 12.0–15.0)
MCH: 22.2 pg — ABNORMAL LOW (ref 26.0–34.0)
MCHC: 29.6 g/dL — AB (ref 30.0–36.0)
MCV: 74.8 fL — ABNORMAL LOW (ref 80.0–100.0)
Platelets: 104 10*3/uL — ABNORMAL LOW (ref 150–400)
RBC: 3.61 MIL/uL — ABNORMAL LOW (ref 3.87–5.11)
RDW: 20.7 % — AB (ref 11.5–15.5)
WBC: 4.5 10*3/uL (ref 4.0–10.5)
nRBC: 0 % (ref 0.0–0.2)

## 2017-11-29 MED ORDER — IPRATROPIUM BROMIDE 0.02 % IN SOLN
0.5000 mg | Freq: Two times a day (BID) | RESPIRATORY_TRACT | Status: DC
  Administered 2017-11-29 – 2017-12-02 (×6): 0.5 mg via RESPIRATORY_TRACT
  Filled 2017-11-29 (×6): qty 2.5

## 2017-11-29 MED ORDER — LEVALBUTEROL HCL 1.25 MG/0.5ML IN NEBU
1.2500 mg | INHALATION_SOLUTION | Freq: Two times a day (BID) | RESPIRATORY_TRACT | Status: DC
  Administered 2017-11-29 – 2017-12-02 (×6): 1.25 mg via RESPIRATORY_TRACT
  Filled 2017-11-29 (×6): qty 0.5

## 2017-11-29 MED ORDER — FUROSEMIDE 10 MG/ML IJ SOLN
60.0000 mg | Freq: Two times a day (BID) | INTRAMUSCULAR | Status: DC
  Administered 2017-11-29 – 2017-12-03 (×9): 60 mg via INTRAVENOUS
  Filled 2017-11-29 (×9): qty 6

## 2017-11-29 NOTE — Progress Notes (Signed)
Palliative: Mrs. Lauren Crosby, Lauren Crosby, is resting quietly in bed.  She wakes easily when I call her name and touch her arm.  She will open her eyes but not make or keep eye contact.  Present today at bedside is husband of 83 years, Lauren Crosby.  I introduced myself and palliative medicine.  Lauren Crosby states that his wife is "getting better", and they do not need palliative services.  We talked more about the benefits of palliative medicine as an extra support, NOT hospice.  We talked about her current health concerns, Lauren Crosby is quick to note that his wife has improved over the last few hours.  He shares that she is now speaking to him, she could not do that this morning.  We talked about time, 24 to 48 hours for outcomes.  Lauren Crosby asks about physical therapy, we talked about Lauren Crosby's ability to participate in PT today, her respiratory frailty.  Lauren Crosby also asks about rehab.  He states that the prefer to not return to [place] form for rehab.  I share that I will pass this information to social work, they will follow-up.  Conference with social work related to disposition.  Conference with hospitalist related to goals of care, patient condition.  15 minutes Quinn Axe, NP Palliative medicine team [phone]

## 2017-11-29 NOTE — Progress Notes (Addendum)
Subjective: On BIPAP. Per husband has noted no further seizures, but she is not back to baseline. Currently on Keppra 1 gram BID. Per notes she has been uncomfortable and moving around.   Exam: Patient on BIPAP resting comfortably. Wakes easily to voice. When staff was changing her bed she was moaning in discomfort with stomach pain. Eyes conjugate, EOMI--Able to look left and right, follows me laterally when moving, pupils reactive 2-1 mm. Face symmetrical. Localizes to pain with sternal rub, withdraws from pain in UE right>left, with draws from noxious stimuli in bilateral legs. Toes up going.    EEG: IMPRESSION: This is an abnormal EEG secondary to general background slowing.  This finding may be seen with a diffuse disturbance that is etiologically nonspecific, but may include a metabolic encephalopathy, among other possibilities.  No epileptiform activity was noted.   Assessment/Recommendations:  84 year old female admitted due to septic shock and C. difficile. Today at approximately 1300 patient experienced a new onset seizure with period of PEA arrest.   - No significant sedation from [place]. Continue Keppra at current dose - MRI brain - Will continue to follow as patient is still not back to baseline. DDx includes prolonged postictal state, occult infection with associated delirium and intracerebral lesion   Etta Quill PA-C [facility] [phone] 11/29/2017, 9:06 AM  Electronically signed: Dr. Kerney Elbe

## 2017-11-29 NOTE — Progress Notes (Signed)
Received call from MRI department regarding taking patient down for transport. Notified them that patient is on continuous bipap and is not medically ready to go to MRI at this time. Previous attempt to wean from Bipap unsuccessful. If patient able to tolerate coming off bipap and is stable, I let her know that we will call MRI back.

## 2017-11-29 NOTE — Progress Notes (Signed)
Dr. Waldron Labs at bedside. Performed assessment. Discussed plan of care reviewed with patient's husband. Etta Quill, PA, at bedside. Assessed patient, updates given. Plan of care reviewed.

## 2017-11-29 NOTE — Progress Notes (Signed)
Patient received furosemide 60 mg IVP this am. Very minimal urine output. Bladder scanned with greater than 977 mL noted. Unsure if this is all urine d/t possible abdominal ascites. Paged and notified Dr Waldron Labs of bladder scan results. New order received for foley catheter placement.

## 2017-11-29 NOTE — Progress Notes (Signed)
Pt moaning and pulling at her telemetry leads. Asked pt if she was in pain and she nodded her head. Asked her if it was her back side that was hurting and she shook her head. Asked pt if it was her belly that was hurting and she nodded her head again. Pt stares off and does not respond at times, but then will focus on RN and nod appropriately. She still seems unable to follow commands. Administered rectal tylenol and will continue to monitor.

## 2017-11-29 NOTE — Progress Notes (Signed)
Pt taken off bipap and placed on 6L HFNC at this time. RT noted significant redness to bridge of nose and nostrils have dried secretions causing them to stick togetherand RT had a difficult time to insert [place]. VS within normal limits. RN made aware of nose.

## 2017-11-29 NOTE — Progress Notes (Signed)
Urine output 1100 mL after foley inserted d/t acute urinary retention.

## 2017-11-29 NOTE — Progress Notes (Signed)
PROGRESS NOTE                                                                                                                                                                                                             Patient Demographics:    Lauren Crosby, is a 84 y.o. female, DOB - [birth year], CMK:[id number]  Admit date - 11/26/2017   Admitting Physician Ivor Costa, MD  Outpatient Primary MD for the patient is [name], MD  LOS - 2   Chief Complaint  Patient presents with  . Altered Mental Status       Brief Narrative    84 y.o. female with medical history significant of hypertension, hyperlipidemia, diabetes mellitus, GERD, gout, iron deficiency anemia, diverticulitis, kidney stone, thalassemia minor, with recent hospitalization from 10/22-11/8 due to septic shock which was possibly due to C. difficile colitis. Pt also had worsening renal function and required CRRT in ICU. Her kidney function has improved.  Her creatinine was 1.6 on 11/8. Pt was discharged on Lasix 40 mg daily to nursing home for rehab as stable condition. -Patient presents with confusion, fever, she did have episode of seizures as well, and decompensated respiratory failure where she is currently on BiPAP.   Subjective:    Lauren Crosby with no significant events overnigh per staff,, she remains confused, nonverbal,    Assessment  & Plan :    Principal Problem:   Sepsis ([place]) Active Problems:   Depression   HTN (hypertension)   GERD   Chronic anemia   Iron deficiency anemia   CKD (chronic kidney disease), stage IV (HCC)   Acute metabolic encephalopathy   C. difficile diarrhea   Type II diabetes mellitus with renal manifestations ([place])  SIRS -Patient presents with fever, tachycardia and tachypnea, and elevated lactic acid, no source of infection currently could be identified . -  CT abdomen/pelvis possible punctate stones in the distal left ureter, mild  proximal hydronephrosis and hydroureter and gas in the bladder, indicating possible urinary tract infection, but patient urinalysis negative.  -Procalcitonin is within normal limits, she is nontoxic-appearing, but remains encephalopathic, I will discontinue vancomycin for now, continue with cefepime and Flagyl .  Acute hypercapnic respiratory failure -Patient with increased work of breathing, required BiPAP 11/27/2017, ABG showing PCO2 of 55,  -Chest x-ray showing some evidence of volume overload,  ABG this morning showing improved PCO2, will attempt weaning trial on BiPAP, started on Lasix 60 mg IV twice daily  Seizures -neurology on board, continue with Keppra  Acute metabolic encephalopathy:  -CT head negative, patient not back to baseline, ammonia within normal level .  MRI head pending, neurology input appreciated  Depression: -Hold all home oral medications until mental status improves  HTN (hypertension): -IV hydralazine PRN  GERD -IV pepcid  CKD (chronic kidney disease), stage IV ([place]): recent cre 1.6 on 11/8-->1.43 today. BUN 27.  -f/u by BMP  C. difficile diarrhea: no diarrhea currently -observe  Type II diabetes mellitus with renal manifestations ([place]): Last A1c 6.7 on 05/24/17, well controled. Patient is taking Lantus and NovoLog at home -will decrease Lantus dose from 10 to 7 units daily -SSI  Anemia of chronic kidney disease -Monitor hemoglobin closely, known history of thalassemia minor  Hypokalemia -Monitor closely   Code Status : DNR  Family Communication  : Discussed with husband at bedside  Disposition Plan  : pending further work up  Consults  :  PCCM  Procedures  : neurology  DVT Prophylaxis  :  [place] lovenox  Lab Results  Component Value Date   PLT 104 (L) 11/29/2017    Antibiotics  :    Anti-infectives (From admission, onward)   Start     Dose/Rate Route Frequency Ordered Stop   11/29/17 2200  vancomycin (VANCOCIN) 1,500 mg in  sodium chloride 0.9 % 500 mL IVPB     1,500 mg 250 mL/hr over 120 Minutes Intravenous Every 48 hours 11/28/17 1418     11/28/17 1800  ceFEPIme (MAXIPIME) 1 g in sodium chloride 0.9 % 100 mL IVPB     1 g 200 mL/hr over 30 Minutes Intravenous Every 24 hours 11/28/17 1418     11/28/17 0000  vancomycin (VANCOCIN) 1,250 mg in sodium chloride 0.9 % 250 mL IVPB  Status:  Discontinued     1,250 mg 166.7 mL/hr over 90 Minutes Intravenous Every 24 hours 11/27/17 0159 11/28/17 1418   11/27/17 1800  ceFEPIme (MAXIPIME) 2 g in sodium chloride 0.9 % 100 mL IVPB  Status:  Discontinued     2 g 200 mL/hr over 30 Minutes Intravenous Every 24 hours 11/27/17 1502 11/28/17 1418   11/27/17 0200  metroNIDAZOLE (FLAGYL) IVPB 500 mg     500 mg 100 mL/hr over 60 Minutes Intravenous Every 8 hours 11/27/17 0126     11/27/17 0200  aztreonam (AZACTAM) 1 g in sodium chloride 0.9 % 100 mL IVPB  Status:  Discontinued     1 g 200 mL/hr over 30 Minutes Intravenous Every 8 hours 11/27/17 0159 11/27/17 1502   11/27/17 0200  vancomycin (VANCOCIN) 2,000 mg in sodium chloride 0.9 % 500 mL IVPB     2,000 mg 250 mL/hr over 120 Minutes Intravenous  Once 11/27/17 0159 11/27/17 0606   11/26/17 2200  cefTRIAXone (ROCEPHIN) 2 g in sodium chloride 0.9 % 100 mL IVPB  Status:  Discontinued     2 g 200 mL/hr over 30 Minutes Intravenous Every 24 hours 11/26/17 2140 11/27/17 0125        Objective:   Vitals:   11/29/17 1010 11/29/17 1106 11/29/17 1210 11/29/17 1218  BP: (!) 159/106 (!) 159/106 120/64   Pulse: 86 89 64   Resp: 17 19    Temp:    98.6 F (37 C)  TempSrc:      SpO2: 91% 100% (!) 75%  Wt Readings from Last 3 Encounters:  11/22/17 109.8 kg  11/19/17 109.8 kg  11/16/17 109.8 kg     Intake/Output Summary (Last 24 hours) at 11/29/2017 1234 Last data filed at 11/29/2017 1007 Gross per 24 hour  Intake 950 ml  Output 390 ml  Net 560 ml     Physical Exam  He remains somnolent on BiPAP, responding to  painful stimuli, open eyes intermittently, but does not follow commands or answer questions appropriately  Symmetrical Chest wall movement, Good air movement bilaterally, no wheezing RRR,No Gallops,Rubs or new Murmurs, No Parasternal Heave +ve B.Sounds, Abd Soft, No tenderness, No rebound - guarding or rigidity. No Cyanosis, Clubbing, has chronic lower extremity skin changes, has trace edema    Data Review:    CBC Recent Labs  Lab 11/26/17 2152 11/27/17 0327 11/28/17 0327 11/29/17 0256  WBC 7.2 8.3 4.4 4.5  HGB 10.2* 9.5* 7.6* 8.0*  HCT 33.4* 33.0* 25.9* 27.0*  PLT 160 160 107* 104*  MCV 73.1* 74.0* 74.4* 74.8*  MCH 22.3* 21.3* 21.8* 22.2*  MCHC 30.5 28.8* 29.3* 29.6*  RDW 20.0* 20.1* 20.6* 20.7*  LYMPHSABS 0.6*  --   --   --   MONOABS 0.2  --   --   --   EOSABS 0.0  --   --   --   BASOSABS 0.0  --   --   --     Chemistries  Recent Labs  Lab 11/26/17 2152 11/27/17 0327 11/28/17 0327 11/29/17 0256  NA 138 139 143 144  K 4.2 4.0 3.0* 3.7  CL 96* 98 108 111  CO2 _0 GLUCOSE 300* 340* 205* 137*  BUN 27* 26* 35* 37*  CREATININE 1.43* 1.55* 1.73* 1.56*  CALCIUM 9.3 8.8* 7.6* 7.7*  AST 29  --   --   --   ALT 21  --   --   --   ALKPHOS 137*  --   --   --   BILITOT 1.0  --   --   --    ------------------------------------------------------------------------------------------------------------------ No results for input(s): CHOL, HDL, LDLCALC, TRIG, CHOLHDL, LDLDIRECT in the last 72 hours.  Lab Results  Component Value Date   HGBA1C 6.7 (H) 05/24/2017   ------------------------------------------------------------------------------------------------------------------ No results for input(s): TSH, T4TOTAL, T3FREE, THYROIDAB in the last 72 hours.  Invalid input(s): FREET3 ------------------------------------------------------------------------------------------------------------------ Recent Labs    11/28/17 1202  VITAMINB12 523  FOLATE 34.8  FERRITIN  895*  TIBC 213*  IRON 79  RETICCTPCT 1.8    Coagulation profile Recent Labs  Lab 11/27/17 0327  INR 1.20    No results for input(s): DDIMER in the last 72 hours.  Cardiac Enzymes No results for input(s): CKMB, TROPONINI, MYOGLOBIN in the last 168 hours.  Invalid input(s): CK ------------------------------------------------------------------------------------------------------------------    Component Value Date/Time   BNP 859.9 (H) 11/27/2017 0327   BNP 18.5 08/14/2012 0930    Inpatient Medications  Scheduled Meds: . chlorhexidine  15 mL Mouth Rinse BID  . furosemide  60 mg Intravenous Q12H  . heparin  5,000 Units Subcutaneous Q8H  . insulin aspart  0-5 Units Subcutaneous QHS  . insulin aspart  0-9 Units Subcutaneous TID WC  . insulin glargine  7 Units Subcutaneous QHS  . ipratropium  0.5 mg Nebulization BID  . levalbuterol  1.25 mg Nebulization BID  . mouth rinse  15 mL Mouth Rinse q12n4p   Continuous Infusions: . sodium chloride 1,000 mL (11/28/17 0745)  . ceFEPime (MAXIPIME) IV  1 g (11/28/17 1719)  . famotidine (PEPCID) IV 20 mg (11/29/17 0905)  . levETIRAcetam 1,000 mg (11/29/17 0238)  . metronidazole 500 mg (11/29/17 1007)  . vancomycin     PRN Meds:.sodium chloride, acetaminophen **OR** acetaminophen, hydrALAZINE, LORazepam, ondansetron **OR** ondansetron (ZOFRAN) IV  Micro Results Recent Results (from the past 240 hour(s))  Blood Culture (routine x 2)     Status: None (Preliminary result)   Collection Time: 11/26/17  9:52 PM  Result Value Ref Range Status   Specimen Description BLOOD RIGHT HAND  Final   Special Requests   Final    BOTTLES DRAWN AEROBIC AND ANAEROBIC Blood Culture adequate volume   Culture   Final    NO GROWTH 2 DAYS Performed at [facility], [place] [address] [place], [place] [address]    Report Status PENDING  Incomplete  Urine culture     Status: None   Collection Time: 11/26/17 10:37 PM  Result Value Ref Range Status    Specimen Description URINE, CATHETERIZED  Final   Special Requests NONE  Final   Culture   Final    NO GROWTH Performed at [facility], [address] [place], [place] [address]    Report Status 11/28/2017 FINAL  Final  Blood Culture (routine x 2)     Status: None (Preliminary result)   Collection Time: 11/26/17 11:39 PM  Result Value Ref Range Status   Specimen Description BLOOD LEFT FOREARM  Final   Special Requests   Final    BOTTLES DRAWN AEROBIC AND ANAEROBIC Blood Culture adequate volume   Culture   Final    NO GROWTH 1 DAY Performed at [facility], [place] [address], [place], [place] [address]    Report Status PENDING  Incomplete  Respiratory Panel by PCR     Status: None   Collection Time: 11/27/17  1:38 PM  Result Value Ref Range Status   Adenovirus NOT DETECTED NOT DETECTED Final   Coronavirus 229E NOT DETECTED NOT DETECTED Final   Coronavirus HKU1 NOT DETECTED NOT DETECTED Final   Coronavirus NL63 NOT DETECTED NOT DETECTED Final   Coronavirus OC43 NOT DETECTED NOT DETECTED Final   Metapneumovirus NOT DETECTED NOT DETECTED Final   Rhinovirus / Enterovirus NOT DETECTED NOT DETECTED Final   Influenza A NOT DETECTED NOT DETECTED Final   Influenza B NOT DETECTED NOT DETECTED Final   Parainfluenza Virus 1 NOT DETECTED NOT DETECTED Final   Parainfluenza Virus 2 NOT DETECTED NOT DETECTED Final   Parainfluenza Virus 3 NOT DETECTED NOT DETECTED Final   Parainfluenza Virus 4 NOT DETECTED NOT DETECTED Final   Respiratory Syncytial Virus NOT DETECTED NOT DETECTED Final   Bordetella pertussis NOT DETECTED NOT DETECTED Final   Chlamydophila pneumoniae NOT DETECTED NOT DETECTED Final   Mycoplasma pneumoniae NOT DETECTED NOT DETECTED Final    Comment: Performed at [facility], [place] [address], [place], [place] [address]    Radiology Reports Ct Abdomen Pelvis Wo Contrast  Result Date: 11/27/2017 CLINICAL DATA:  Abdominal distention, altered mental status,  sepsis. History of Clostridium difficile colitis, open wound of the abdominal wall, nephrolithiasis, hernia, gastroesophageal reflux disease, diverticulitis, colon polyp, cellulitis and abscess, adrenal adenoma, ventral hernia repair, hysterectomy, sigmoid resection, cholecystectomy, appendectomy. EXAM: CT ABDOMEN AND PELVIS WITHOUT CONTRAST TECHNIQUE: Multidetector CT imaging of the abdomen and pelvis was performed following the standard protocol without IV contrast. COMPARISON:  10/30/2017 FINDINGS: Lower chest: Evaluation is limited due to motion artifact.  Atelectasis or consolidation in the lung bases, greater on the left. Coronary artery calcifications. Calcification in the mitral valve annulus. Hepatobiliary: No focal liver abnormality is seen. Status post cholecystectomy. No biliary dilatation. Pancreas: Fatty infiltration of the pancreas. Spleen: Normal in size without focal abnormality. Adrenals/Urinary Tract: 3.1 cm diameter left adrenal gland nodule. No change since previous study. Fat attenuation consistent with benign adenoma. Bilateral renal parenchymal atrophy. Mild left hydronephrosis and hydroureter with tiny punctate stones suggested in the distal left ureter. Bladder wall is not thickened. Gas in the bladder may come from instrumentation or infection. Exophytic lesion arising from the right kidney without change since prior study. Hyperdensity likely indicating a hemorrhagic cyst. Stomach/Bowel: Stomach, small bowel, and colon are not abnormally distended. Scattered stool throughout the colon. Scattered colonic diverticula. No evidence of diverticulitis. Prominent stool in the rectum with mild rectal wall thickening may indicate stercoral colitis. Infiltration in the presacral fat is unchanged since prior study, possibly inflammatory or reactive. Vascular/Lymphatic: Aortic atherosclerosis. No enlarged abdominal or pelvic lymph nodes. Reproductive: Status post hysterectomy. No adnexal masses.  Other: Small amount of free fluid around the liver and spleen. Likely ascites. Scarring in the anterior abdominal wall consistent with postoperative change. Edema in the subcutaneous fat. No free air in the abdomen. Musculoskeletal: Degenerative changes in the spine. No destructive bone lesions. IMPRESSION: 1. Atelectasis or consolidation in the lung bases, greater on the left. 2. Left adrenal gland adenoma. 3. Bilateral renal parenchymal atrophy. Punctate stones suggested in the distal left ureter. Mild proximal hydronephrosis and hydroureter. Gas in the bladder may be due to instrumentation or infection. 4. Prominent stool in the rectum with rectal wall thickening suggesting possible stercoral colitis. 5. Small amount of free fluid in the abdomen and pelvis, likely ascites. 6. Probable hemorrhagic cysts in the right kidney. Aortic Atherosclerosis (ICD10-I70.0). Electronically Signed   By: Lucienne Capers M.D.   On: 11/27/2017 03:10   Ct Abdomen Pelvis Wo Contrast  Result Date: 10/30/2017 CLINICAL DATA:  Abdominal distension, diarrhea for 5 days, blood in diarrhea, unable to control bladder or bowel, history of adrenal adenoma, diabetes mellitus, hypertension, nephrolithiasis, renal failure EXAM: CT ABDOMEN AND PELVIS WITHOUT CONTRAST TECHNIQUE: Multidetector CT imaging of the abdomen and pelvis was performed following the standard protocol without IV contrast. Sagittal and coronal MPR images reconstructed from axial data set. Patient drank dilute oral contrast for exam COMPARISON:  05/24/2017 FINDINGS: Lower chest: Bibasilar atelectasis and peribronchial thickening Hepatobiliary: Gallbladder surgically absent. No focal hepatic abnormalities Pancreas: Pancreatic atrophy without mass Spleen: Normal appearance Adrenals/Urinary Tract: Low-attenuation LEFT adrenal mass 3.1 x 2.6 cm consistent with adenoma. RIGHT adrenal gland unremarkable. BILATERAL renal cortical atrophy. Exophytic intermediate attenuation mass  lateral aspect of inferior RIGHT kidney, 3.7 x 3.6 cm image 56. Minimal prominence of LEFT renal pelvis and LEFT ureter in the pelvis without obstructing calculus. RIGHT ureter and bladder normal appearance. Stomach/Bowel: Appendix surgically absent by history. Scattered colonic diverticulosis. Wall thickening of sigmoid colon with mild pericolic infiltrative changes. Additional wall thickening at the rectosigmoid colon. Findings could represent acute diverticulitis or colitis. No abscess or extraluminal gas. Stomach and small bowel loops unremarkable. Vascular/Lymphatic: Atherosclerotic calcifications aorta and iliac arteries. Coronary arterial calcifications. Aorta normal caliber. No adenopathy. Reproductive: Uterus surgically absent.  Normal appearing ovaries. Other: No free air or free fluid. Tiny LEFT parasagittal ventral hernia inferior to the umbilicus. Musculoskeletal: Demineralized with degenerative disc and facet disease changes lumbar spine. IMPRESSION: Diffuse colonic diverticulosis. Wall thickening of the rectosigmoid colon  with mild pericolic infiltrative changes, question diverticulitis versus colitis. No evidence of perforation or abscess. LEFT adrenal adenoma 3.1 x 2.6 cm. Grossly unchanged appearance of an exophytic mass 3.7 x 3.6 cm at the lateral RIGHT kidney, question complicated cyst; lesion is unchanged since 05/24/2017 but minimally larger than seen on 02/10/2015 when it measured 3.5 x 3.2 cm, recommended attention on follow-up imaging to establish long-term stability. Electronically Signed   By: Lavonia Dana M.D.   On: 10/30/2017 13:03   Dg Abd 1 View  Result Date: 11/09/2017 CLINICAL DATA:  Patient pulled out nasogastric catheter EXAM: ABDOMEN - 1 VIEW COMPARISON:  11/07/2017 FINDINGS: Scattered large and small bowel gas is noted. The previously seen nasogastric catheter has been removed in the interval. Despite the given clinical history of metal tipped catheter this catheter on  previous images represented a standard nasogastric catheter without metallic weighting. No foreign body is noted. IMPRESSION: No evidence of retained foreign body as the prior nasogastric catheter was not a weighted tipped catheter based on previous images. Electronically Signed   By: Inez Catalina M.D.   On: 11/09/2017 19:57   Dg Abd 1 View  Result Date: 11/07/2017 CLINICAL DATA:  84 year old female with feeding tube placement. EXAM: ABDOMEN - 1 VIEW COMPARISON:  Earlier abdominal radiograph dated 11/07/2017 FINDINGS: No significant change in the appearance or positioning of the enteric tube which appears somewhat folded, likely in the mid to distal stomach. IMPRESSION: No significant interval change. Electronically Signed   By: Anner Crete M.D.   On: 11/07/2017 23:00   Dg Abd 1 View  Result Date: 11/03/2017 CLINICAL DATA:  Abdominal distension. EXAM: ABDOMEN - 1 VIEW COMPARISON:  CT, 10/30/2017 FINDINGS: Normal bowel gas pattern with no evidence of obstruction. Soft tissues are not well-defined due to body habitus. No convincing renal or ureteral stones. Status post cholecystectomy. No acute skeletal abnormality. IMPRESSION: 1. No acute findings.  No evidence of bowel obstruction. Electronically Signed   By: Lajean Manes M.D.   On: 11/03/2017 11:17   Ct Head Wo Contrast  Result Date: 11/27/2017 CLINICAL DATA:  Altered mental status. History of hypertension, hepatitis, diabetes, edema. EXAM: CT HEAD WITHOUT CONTRAST TECHNIQUE: Contiguous axial images were obtained from the base of the skull through the vertex without intravenous contrast. COMPARISON:  MRI brain 11/29/2016. CT head 11/24/2016. FINDINGS: Brain: Diffuse cerebral atrophy. Ventricular dilatation consistent with central atrophy. Low-attenuation changes throughout the deep white matter consistent with small vessel ischemia. No mass-effect or midline shift. No abnormal extra-axial fluid collections. Gray-white matter junctions are  distinct. Basal cisterns are not effaced. No acute intracranial hemorrhage. Vascular: Moderate intracranial arterial vascular calcifications are present. Skull: Calvarium appears intact. Sinuses/Orbits: Paranasal sinuses and mastoid air cells are clear. Other: None. IMPRESSION: No acute intracranial abnormalities. Chronic atrophy and small vessel ischemic changes. Electronically Signed   By: Lucienne Capers M.D.   On: 11/27/2017 03:02   Ir Fluoro Guide Cv Line Right  Result Date: 11/03/2017 INDICATION: End-stage renal disease. In need intravenous access for the initiation of hemodialysis. EXAM: NON-TUNNELED CENTRAL VENOUS HEMODIALYSIS CATHETER PLACEMENT WITH ULTRASOUND AND FLUOROSCOPIC GUIDANCE COMPARISON:  Chest radiograph - 10/31/2017 MEDICATIONS: None FLUOROSCOPY TIME:  24 seconds (14 mGy) COMPLICATIONS: None immediate. PROCEDURE: Informed written consent was obtained from patient's family after a discussion of the risks, benefits, and alternatives to treatment. Questions regarding the procedure were encouraged and answered. The right neck and chest were prepped with chlorhexidine in a sterile fashion, and a sterile drape was applied  covering the operative field. Maximum barrier sterile technique with sterile gowns and gloves were used for the procedure. A timeout was performed prior to the initiation of the procedure. After the overlying soft tissues were anesthetized, a small venotomy incision was created and a micropuncture kit was utilized to access the internal jugular vein. Real-time ultrasound guidance was utilized for vascular access including the acquisition of a permanent ultrasound image documenting patency of the accessed vessel. The microwire was utilized to measure appropriate catheter length. A stiff glidewire was advanced to the level of the IVC. Under fluoroscopic guidance, the venotomy was serially dilated, ultimately allowing placement of a 20 cm temporary Trialysis catheter with tip  ultimately terminating within the superior aspect of the right atrium. Final catheter positioning was confirmed and documented with a spot radiographic image. The catheter aspirates and flushes normally. The catheter was flushed with appropriate volume heparin dwells. The catheter exit site was secured with a 0-Prolene retention suture. A dressing was placed. The patient tolerated the procedure well without immediate post procedural complication. IMPRESSION: Successful placement of a right internal jugular approach 20 cm temporary dialysis catheter with tip terminating with in the superior aspect of the right atrium. The catheter is ready for immediate use. PLAN: This catheter may be converted to a tunneled dialysis catheter at a later date as indicated. Electronically Signed   By: Sandi Mariscal M.D.   On: 11/03/2017 18:01   Ir US Guide Vasc Access Right  Result Date: 11/03/2017 INDICATION: End-stage renal disease. In need intravenous access for the initiation of hemodialysis. EXAM: NON-TUNNELED CENTRAL VENOUS HEMODIALYSIS CATHETER PLACEMENT WITH ULTRASOUND AND FLUOROSCOPIC GUIDANCE COMPARISON:  Chest radiograph - 10/31/2017 MEDICATIONS: None FLUOROSCOPY TIME:  24 seconds (14 mGy) COMPLICATIONS: None immediate. PROCEDURE: Informed written consent was obtained from patient's family after a discussion of the risks, benefits, and alternatives to treatment. Questions regarding the procedure were encouraged and answered. The right neck and chest were prepped with chlorhexidine in a sterile fashion, and a sterile drape was applied covering the operative field. Maximum barrier sterile technique with sterile gowns and gloves were used for the procedure. A timeout was performed prior to the initiation of the procedure. After the overlying soft tissues were anesthetized, a small venotomy incision was created and a micropuncture kit was utilized to access the internal jugular vein. Real-time ultrasound guidance was utilized  for vascular access including the acquisition of a permanent ultrasound image documenting patency of the accessed vessel. The microwire was utilized to measure appropriate catheter length. A stiff glidewire was advanced to the level of the IVC. Under fluoroscopic guidance, the venotomy was serially dilated, ultimately allowing placement of a 20 cm temporary Trialysis catheter with tip ultimately terminating within the superior aspect of the right atrium. Final catheter positioning was confirmed and documented with a spot radiographic image. The catheter aspirates and flushes normally. The catheter was flushed with appropriate volume heparin dwells. The catheter exit site was secured with a 0-Prolene retention suture. A dressing was placed. The patient tolerated the procedure well without immediate post procedural complication. IMPRESSION: Successful placement of a right internal jugular approach 20 cm temporary dialysis catheter with tip terminating with in the superior aspect of the right atrium. The catheter is ready for immediate use. PLAN: This catheter may be converted to a tunneled dialysis catheter at a later date as indicated. Electronically Signed   By: Sandi Mariscal M.D.   On: 11/03/2017 18:01   Dg Chest Port 1 View  Result Date:  11/28/2017 CLINICAL DATA:  Hypoxia. EXAM: PORTABLE CHEST 1 VIEW COMPARISON:  Radiograph November 26, 2017. FINDINGS: Stable cardiomegaly is noted with central pulmonary vascular congestion. No pneumothorax or pleural effusion is noted. Possible mild bilateral pulmonary edema may be present. Bony thorax is unremarkable. IMPRESSION: Stable cardiomegaly with central pulmonary vascular congestion and possible mild bilateral pulmonary edema. Electronically Signed   By: Marijo Conception, M.D.   On: 11/28/2017 12:55   Dg Chest Port 1 View  Result Date: 11/26/2017 CLINICAL DATA:  Altered mental status EXAM: PORTABLE CHEST 1 VIEW COMPARISON:  11/13/2017 FINDINGS: AP portable semi  upright view of the chest. The patient is rotated to the right. Stable cardiomegaly with interstitial edema. No pulmonary confluence, effusion or pneumothorax. No acute osseous abnormality. IMPRESSION: Cardiomegaly with interstitial edema.  No significant change. Electronically Signed   By: Ashley Royalty M.D.   On: 11/26/2017 22:48   Dg Chest Port 1 View  Result Date: 11/13/2017 CLINICAL DATA:  Shortness of breath EXAM: PORTABLE CHEST 1 VIEW COMPARISON:  11/09/2017 FINDINGS: Cardiomegaly and slightly increased pulmonary vascular congestion noted. Mild bibasilar opacities/atelectasis noted in this low volume film. A RIGHT central venous catheter with tips overlying the LOWER SVC/SUPERIOR cavoatrial junction noted. No pneumothorax. IMPRESSION: Cardiomegaly with pulmonary vascular congestion and mild bibasilar opacities/atelectasis. Electronically Signed   By: Margarette Canada M.D.   On: 11/13/2017 07:49   Dg Chest Port 1 View  Result Date: 11/09/2017 CLINICAL DATA:  Patient removed recent nasogastric catheter EXAM: PORTABLE CHEST 1 VIEW COMPARISON:  11/08/2017 FINDINGS: Cardiac shadow is enlarged. Right jugular central line is again seen and stable. Inspiratory effort is poor with crowding of the vascular markings. No focal confluent infiltrate is seen. No radiopaque foreign body is noted. The previously seen nasogastric catheter has been removed and as previously described was not a weighted tipped catheter. IMPRESSION: Poor inspiratory effort.  No acute abnormality noted. Electronically Signed   By: Inez Catalina M.D.   On: 11/09/2017 19:58   Dg Chest Port 1 View  Result Date: 11/08/2017 CLINICAL DATA:  Respiratory failure. EXAM: PORTABLE CHEST 1 VIEW COMPARISON:  11/07/2017 and older exams. FINDINGS: Mild enlargement of the cardiopericardial silhouette, stable. Prominent bronchovascular markings, with additional lung base opacity, the latter consistent with atelectasis, also without change from the previous  day's exam. New nasal/orogastric tube passes below the included field of view, likely into the stomach. Right subclavian dual lumen central venous catheter is stable, tip in the right atrium. No pneumothorax. IMPRESSION: 1. No significant change in lung aeration from the previous day's study. There are prominent bronchovascular markings and lung base atelectasis, but no convincing pulmonary edema or pneumonia. 2. New nasal/orogastric tube. Tip not visualized, but likely extends into the stomach. Electronically Signed   By: Lajean Manes M.D.   On: 11/08/2017 07:13   Dg Chest Port 1 View  Result Date: 11/07/2017 CLINICAL DATA:  Acute respiratory failure with hypoxia. EXAM: PORTABLE CHEST 1 VIEW COMPARISON:  10/31/2017. FINDINGS: Cardiomegaly. Improved lung volumes. Increased perihilar markings could represent early edema or viral pneumonitis. Other than improved lung markings, similar appearance to priors. Dialysis catheter has been inserted via RIGHT IJ approach. Tip slide in the RIGHT atrium. There is no pneumothorax. IMPRESSION: Satisfactory catheter placement. No significant change in the appearance of the lungs except for improved volume. Cardiomegaly. Electronically Signed   By: Staci Righter M.D.   On: 11/07/2017 11:30   Dg Chest Port 1 View  Result Date: 10/31/2017 CLINICAL DATA:  84 year old female with shortness of breath. EXAM: PORTABLE CHEST 1 VIEW COMPARISON:  Portable chest 10/30/2017 and earlier. CT Abdomen and Pelvis 10/30/2017. FINDINGS: There did not appear to be overt pulmonary edema at the lung bases yesterday by CT. Lower lobe bronchiectasis and peribronchial thickening was noted. Continued low lung volumes stable appearance of the pulmonary interstitium today from May this year. Stable mild cardiomegaly. Visualized tracheal air column is within normal limits. No pneumothorax, pleural effusion or confluent opacity. Large body habitus. No acute osseous abnormality identified. IMPRESSION:  Persistent low lung volumes. No focal pulmonary abnormality. Consider viral or atypical respiratory infection given the appearance of the lung bases on CT Abdomen and Pelvis yesterday. Electronically Signed   By: Genevie Ann M.D.   On: 10/31/2017 13:18   Dg Chest Port 1 View  Result Date: 10/30/2017 CLINICAL DATA:  84 year old female with constipation and watery diarrhea. Initial encounter. EXAM: PORTABLE CHEST 1 VIEW COMPARISON:  05/27/2017. FINDINGS: Cardiomegaly. Asymmetric mild pulmonary edema superimposed upon chronic changes. Calcified aorta. IMPRESSION: 1. Asymmetric mild pulmonary edema. 2. Cardiomegaly. 3.  Aortic Atherosclerosis (ICD10-I70.0). Electronically Signed   By: Genia Del M.D.   On: 10/30/2017 15:06   Dg Abd Portable 1v  Result Date: 11/07/2017 CLINICAL DATA:  Nasogastric tube placement. EXAM: PORTABLE ABDOMEN - 1 VIEW COMPARISON:  11/03/2017 FINDINGS: Interval nasogastric tube folded back upon itself in the mid to distal stomach with its tip in the mid stomach. The included bowel gas pattern is normal. Prominent interstitial markings at the left lung base. Lower thoracic spine degenerative changes. IMPRESSION: Nasogastric tube folded back upon itself in the mid to distal stomach with its tip in the mid stomach. Electronically Signed   By: Claudie Revering M.D.   On: 11/07/2017 13:13     Phillips Climes M.D on 11/29/2017 at 12:34 PM  Between 7am to 7pm - Pager - [phone]  After 7pm go to [email or web address] - password [facility]  [facility] -  Office  [phone]                                          PROGRESS NOTE                                                                                                                                                                                                             Patient Demographics:    Lauren Crosby, is a 84 y.o. female, DOB -  Mar 24, 1931, NAT:[id number]  Admit date - 11/26/2017   Admitting Physician Ivor Costa,  MD  Outpatient Primary MD for the patient is [name], MD  LOS - 2   Chief Complaint  Patient presents with  . Altered Mental Status       Brief Narrative     84 y.o. female with medical history significant of hypertension, hyperlipidemia, diabetes mellitus, GERD, gout, iron deficiency anemia, diverticulitis, kidney stone, thalassemia minor, with recent hospitalization from 10/22-11/8 due to septic shock which was possibly due to C. difficile colitis. Pt also had worsening renal function and required CRRT in ICU. Her kidney function has improved.  Her creatinine was 1.6 on 11/8. Pt was discharged on Lasix 40 mg daily to nursing home for rehab as stable condition. -Patient presents with confusion, fever, she did have episode of seizures as well, and decompensated respiratory failure where she is currently on BiPAP.   Subjective:    Lauren Crosby today is nonverbal, cannot provide any complaints, husband at bedside, denies any significant events.   Assessment  & Plan :    Principal Problem:   Sepsis ([place]) Active Problems:   Depression   HTN (hypertension)   GERD   Chronic anemia   Iron deficiency anemia   CKD (chronic kidney disease), stage IV (HCC)   Acute metabolic encephalopathy   C. difficile diarrhea   Type II diabetes mellitus with renal manifestations ([place])  SIRS -Patient presents with fever, tachycardia and tachypnea, and elevated lactic acid, no source of infection currently could be identified . -  CT abdomen/pelvis possible punctate stones in the distal left ureter, mild proximal hydronephrosis and hydroureter and gas in the bladder, indicating possible urinary tract infection, but patient urinalysis negative.  -Continue with broad-spectrum antibiotic coverage currently vancomycin, cefepime and Flagyl pending further work-up of her sepsis.  Acute hypercapnic respiratory failure -Patient with increased work of breathing, required BiPAP 11/27/2017, ABG showing  PCO2 of 55, saturating well, will try off BiPAP.  Seizures -neurology on board, continue with Keppra  Acute metabolic encephalopathy:  -CT head negative, patient not back to baseline, ammonia within normal level .  Depression: -Hold all home oral medications until mental status improves  HTN (hypertension): -IV hydralazine PRN  GERD -IV pepcid  Iron deficiency anemia: -hold iron supplement  CKD (chronic kidney disease), stage IV ([place]): recent cre 1.6 on 11/8-->1.43 today. BUN 27.  -f/u by BMP  C. difficile diarrhea: no diarrhea currently -observe  Type II diabetes mellitus with renal manifestations ([place]): Last A1c 6.7 on 05/24/17, well controled. Patient is taking Lantus and NovoLog at home -will decrease Lantus dose from 10 to 7 units daily -SSI  Anemia , unspecified -Obtain anemia panel, known history of thalassemia minor  Hypokalemia -Potassium of 3 today, will replete, recheck in a.m.   Code Status : DNR  Family Communication  : Discussed with husband at bedside  Disposition Plan  : pending further work up  Consults  :  PCCM  Procedures  : neurology  DVT Prophylaxis  :  [place] lovenox  Lab Results  Component Value Date   PLT 104 (L) 11/29/2017    Antibiotics  :    Anti-infectives (From admission, onward)   Start     Dose/Rate Route Frequency Ordered Stop   11/29/17 2200  vancomycin (VANCOCIN) 1,500 mg in sodium chloride 0.9 % 500 mL IVPB     1,500 mg 250 mL/hr over 120 Minutes Intravenous Every 48 hours 11/28/17 1418  11/28/17 1800  ceFEPIme (MAXIPIME) 1 g in sodium chloride 0.9 % 100 mL IVPB     1 g 200 mL/hr over 30 Minutes Intravenous Every 24 hours 11/28/17 1418     11/28/17 0000  vancomycin (VANCOCIN) 1,250 mg in sodium chloride 0.9 % 250 mL IVPB  Status:  Discontinued     1,250 mg 166.7 mL/hr over 90 Minutes Intravenous Every 24 hours 11/27/17 0159 11/28/17 1418   11/27/17 1800  ceFEPIme (MAXIPIME) 2 g in sodium chloride 0.9 % 100 mL  IVPB  Status:  Discontinued     2 g 200 mL/hr over 30 Minutes Intravenous Every 24 hours 11/27/17 1502 11/28/17 1418   11/27/17 0200  metroNIDAZOLE (FLAGYL) IVPB 500 mg     500 mg 100 mL/hr over 60 Minutes Intravenous Every 8 hours 11/27/17 0126     11/27/17 0200  aztreonam (AZACTAM) 1 g in sodium chloride 0.9 % 100 mL IVPB  Status:  Discontinued     1 g 200 mL/hr over 30 Minutes Intravenous Every 8 hours 11/27/17 0159 11/27/17 1502   11/27/17 0200  vancomycin (VANCOCIN) 2,000 mg in sodium chloride 0.9 % 500 mL IVPB     2,000 mg 250 mL/hr over 120 Minutes Intravenous  Once 11/27/17 0159 11/27/17 0606   11/26/17 2200  cefTRIAXone (ROCEPHIN) 2 g in sodium chloride 0.9 % 100 mL IVPB  Status:  Discontinued     2 g 200 mL/hr over 30 Minutes Intravenous Every 24 hours 11/26/17 2140 11/27/17 0125        Objective:   Vitals:   11/29/17 1010 11/29/17 1106 11/29/17 1210 11/29/17 1218  BP: (!) 159/106 (!) 159/106 120/64   Pulse: 86 89 64   Resp: 17 19    Temp:    98.6 F (37 C)  TempSrc:      SpO2: 91% 100% 100%     Wt Readings from Last 3 Encounters:  11/22/17 109.8 kg  11/19/17 109.8 kg  11/16/17 109.8 kg     Intake/Output Summary (Last 24 hours) at 11/29/2017 1234 Last data filed at 11/29/2017 1007 Gross per 24 hour  Intake 950 ml  Output 390 ml  Net 560 ml     Physical Exam  Patient is somnolent, and BiPAP, in no apparent distress, moaning to sternal rub, otherwise somnolent .Marland Kitchen  Symmetrical Chest wall movement, diminished air entry at the bases, with rhonchi, no wheezing  RRR,No Gallops,Rubs or new Murmurs, No Parasternal Heave +ve B.Sounds, Abd Soft, No tenderness, , No rebound - guarding or rigidity. No Cyanosis, Clubbing, chronic lower extremity skin changes, with mild edema.    Data Review:    CBC Recent Labs  Lab 11/26/17 2152 11/27/17 0327 11/28/17 0327 11/29/17 0256  WBC 7.2 8.3 4.4 4.5  HGB 10.2* 9.5* 7.6* 8.0*  HCT 33.4* 33.0* 25.9* 27.0*  PLT  160 160 107* 104*  MCV 73.1* 74.0* 74.4* 74.8*  MCH 22.3* 21.3* 21.8* 22.2*  MCHC 30.5 28.8* 29.3* 29.6*  RDW 20.0* 20.1* 20.6* 20.7*  LYMPHSABS 0.6*  --   --   --   MONOABS 0.2  --   --   --   EOSABS 0.0  --   --   --   BASOSABS 0.0  --   --   --     Chemistries  Recent Labs  Lab 11/26/17 2152 11/27/17 0327 11/28/17 0327 11/29/17 0256  NA 138 139 143 144  K 4.2 4.0 3.0* 3.7  CL 96* 98 108 111  CO2 _0 GLUCOSE 300* 340* 205* 137*  BUN 27* 26* 35* 37*  CREATININE 1.43* 1.55* 1.73* 1.56*  CALCIUM 9.3 8.8* 7.6* 7.7*  AST 29  --   --   --   ALT 21  --   --   --   ALKPHOS 137*  --   --   --   BILITOT 1.0  --   --   --    ------------------------------------------------------------------------------------------------------------------ No results for input(s): CHOL, HDL, LDLCALC, TRIG, CHOLHDL, LDLDIRECT in the last 72 hours.  Lab Results  Component Value Date   HGBA1C 6.7 (H) 05/24/2017   ------------------------------------------------------------------------------------------------------------------ No results for input(s): TSH, T4TOTAL, T3FREE, THYROIDAB in the last 72 hours.  Invalid input(s): FREET3 ------------------------------------------------------------------------------------------------------------------ Recent Labs    11/28/17 1202  VITAMINB12 523  FOLATE 34.8  FERRITIN 895*  TIBC 213*  IRON 79  RETICCTPCT 1.8    Coagulation profile Recent Labs  Lab 11/27/17 0327  INR 1.20    No results for input(s): DDIMER in the last 72 hours.  Cardiac Enzymes No results for input(s): CKMB, TROPONINI, MYOGLOBIN in the last 168 hours.  Invalid input(s): CK ------------------------------------------------------------------------------------------------------------------    Component Value Date/Time   BNP 859.9 (H) 11/27/2017 0327   BNP 18.5 08/14/2012 0930    Inpatient Medications  Scheduled Meds: . chlorhexidine  15 mL Mouth Rinse BID  .  furosemide  60 mg Intravenous Q12H  . heparin  5,000 Units Subcutaneous Q8H  . insulin aspart  0-5 Units Subcutaneous QHS  . insulin aspart  0-9 Units Subcutaneous TID WC  . insulin glargine  7 Units Subcutaneous QHS  . ipratropium  0.5 mg Nebulization BID  . levalbuterol  1.25 mg Nebulization BID  . mouth rinse  15 mL Mouth Rinse q12n4p   Continuous Infusions: . sodium chloride 1,000 mL (11/28/17 0745)  . ceFEPime (MAXIPIME) IV 1 g (11/28/17 1719)  . famotidine (PEPCID) IV 20 mg (11/29/17 0905)  . levETIRAcetam 1,000 mg (11/29/17 0238)  . metronidazole 500 mg (11/29/17 1007)  . vancomycin     PRN Meds:.sodium chloride, acetaminophen **OR** acetaminophen, hydrALAZINE, LORazepam, ondansetron **OR** ondansetron (ZOFRAN) IV  Micro Results Recent Results (from the past 240 hour(s))  Blood Culture (routine x 2)     Status: None (Preliminary result)   Collection Time: 11/26/17  9:52 PM  Result Value Ref Range Status   Specimen Description BLOOD RIGHT HAND  Final   Special Requests   Final    BOTTLES DRAWN AEROBIC AND ANAEROBIC Blood Culture adequate volume   Culture   Final    NO GROWTH 2 DAYS Performed at [facility], [place] [address], [place], [place] [address]    Report Status PENDING  Incomplete  Urine culture     Status: None   Collection Time: 11/26/17 10:37 PM  Result Value Ref Range Status   Specimen Description URINE, CATHETERIZED  Final   Special Requests NONE  Final   Culture   Final    NO GROWTH Performed at [facility], [address] [place], [place] [address]    Report Status 11/28/2017 FINAL  Final  Blood Culture (routine x 2)     Status: None (Preliminary result)   Collection Time: 11/26/17 11:39 PM  Result Value Ref Range Status   Specimen Description BLOOD LEFT FOREARM  Final   Special Requests   Final    BOTTLES DRAWN AEROBIC AND ANAEROBIC Blood Culture adequate volume   Culture  Final    NO GROWTH 1 DAY Performed at [facility], [place] [address] [place], [place] [address]    Report Status PENDING  Incomplete  Respiratory Panel by PCR     Status: None   Collection Time: 11/27/17  1:38 PM  Result Value Ref Range Status   Adenovirus NOT DETECTED NOT DETECTED Final   Coronavirus 229E NOT DETECTED NOT DETECTED Final   Coronavirus HKU1 NOT DETECTED NOT DETECTED Final   Coronavirus NL63 NOT DETECTED NOT DETECTED Final   Coronavirus OC43 NOT DETECTED NOT DETECTED Final   Metapneumovirus NOT DETECTED NOT DETECTED Final   Rhinovirus / Enterovirus NOT DETECTED NOT DETECTED Final   Influenza A NOT DETECTED NOT DETECTED Final   Influenza B NOT DETECTED NOT DETECTED Final   Parainfluenza Virus 1 NOT DETECTED NOT DETECTED Final   Parainfluenza Virus 2 NOT DETECTED NOT DETECTED Final   Parainfluenza Virus 3 NOT DETECTED NOT DETECTED Final   Parainfluenza Virus 4 NOT DETECTED NOT DETECTED Final   Respiratory Syncytial Virus NOT DETECTED NOT DETECTED Final   Bordetella pertussis NOT DETECTED NOT DETECTED Final   Chlamydophila pneumoniae NOT DETECTED NOT DETECTED Final   Mycoplasma pneumoniae NOT DETECTED NOT DETECTED Final    Comment: Performed at [facility], [place] [address], [place], [place] [address]    Radiology Reports Ct Abdomen Pelvis Wo Contrast  Result Date: 11/27/2017 CLINICAL DATA:  Abdominal distention, altered mental status, sepsis. History of Clostridium difficile colitis, open wound of the abdominal wall, nephrolithiasis, hernia, gastroesophageal reflux disease, diverticulitis, colon polyp, cellulitis and abscess, adrenal adenoma, ventral hernia repair, hysterectomy, sigmoid resection, cholecystectomy, appendectomy. EXAM: CT ABDOMEN AND PELVIS WITHOUT CONTRAST TECHNIQUE: Multidetector CT imaging of the abdomen and pelvis was performed following the standard protocol without IV contrast. COMPARISON:  10/30/2017 FINDINGS: Lower chest: Evaluation is limited due to motion artifact. Atelectasis or consolidation in  the lung bases, greater on the left. Coronary artery calcifications. Calcification in the mitral valve annulus. Hepatobiliary: No focal liver abnormality is seen. Status post cholecystectomy. No biliary dilatation. Pancreas: Fatty infiltration of the pancreas. Spleen: Normal in size without focal abnormality. Adrenals/Urinary Tract: 3.1 cm diameter left adrenal gland nodule. No change since previous study. Fat attenuation consistent with benign adenoma. Bilateral renal parenchymal atrophy. Mild left hydronephrosis and hydroureter with tiny punctate stones suggested in the distal left ureter. Bladder wall is not thickened. Gas in the bladder may come from instrumentation or infection. Exophytic lesion arising from the right kidney without change since prior study. Hyperdensity likely indicating a hemorrhagic cyst. Stomach/Bowel: Stomach, small bowel, and colon are not abnormally distended. Scattered stool throughout the colon. Scattered colonic diverticula. No evidence of diverticulitis. Prominent stool in the rectum with mild rectal wall thickening may indicate stercoral colitis. Infiltration in the presacral fat is unchanged since prior study, possibly inflammatory or reactive. Vascular/Lymphatic: Aortic atherosclerosis. No enlarged abdominal or pelvic lymph nodes. Reproductive: Status post hysterectomy. No adnexal masses. Other: Small amount of free fluid around the liver and spleen. Likely ascites. Scarring in the anterior abdominal wall consistent with postoperative change. Edema in the subcutaneous fat. No free air in the abdomen. Musculoskeletal: Degenerative changes in the spine. No destructive bone lesions. IMPRESSION: 1. Atelectasis or consolidation in the lung bases, greater on the left. 2. Left adrenal gland adenoma. 3. Bilateral renal parenchymal atrophy. Punctate stones suggested in the distal left ureter. Mild proximal hydronephrosis and hydroureter. Gas in the bladder may be due to instrumentation or  infection.  4. Prominent stool in the rectum with rectal wall thickening suggesting possible stercoral colitis. 5. Small amount of free fluid in the abdomen and pelvis, likely ascites. 6. Probable hemorrhagic cysts in the right kidney. Aortic Atherosclerosis (ICD10-I70.0). Electronically Signed   By: Lucienne Capers M.D.   On: 11/27/2017 03:10   Ct Abdomen Pelvis Wo Contrast  Result Date: 10/30/2017 CLINICAL DATA:  Abdominal distension, diarrhea for 5 days, blood in diarrhea, unable to control bladder or bowel, history of adrenal adenoma, diabetes mellitus, hypertension, nephrolithiasis, renal failure EXAM: CT ABDOMEN AND PELVIS WITHOUT CONTRAST TECHNIQUE: Multidetector CT imaging of the abdomen and pelvis was performed following the standard protocol without IV contrast. Sagittal and coronal MPR images reconstructed from axial data set. Patient drank dilute oral contrast for exam COMPARISON:  05/24/2017 FINDINGS: Lower chest: Bibasilar atelectasis and peribronchial thickening Hepatobiliary: Gallbladder surgically absent. No focal hepatic abnormalities Pancreas: Pancreatic atrophy without mass Spleen: Normal appearance Adrenals/Urinary Tract: Low-attenuation LEFT adrenal mass 3.1 x 2.6 cm consistent with adenoma. RIGHT adrenal gland unremarkable. BILATERAL renal cortical atrophy. Exophytic intermediate attenuation mass lateral aspect of inferior RIGHT kidney, 3.7 x 3.6 cm image 56. Minimal prominence of LEFT renal pelvis and LEFT ureter in the pelvis without obstructing calculus. RIGHT ureter and bladder normal appearance. Stomach/Bowel: Appendix surgically absent by history. Scattered colonic diverticulosis. Wall thickening of sigmoid colon with mild pericolic infiltrative changes. Additional wall thickening at the rectosigmoid colon. Findings could represent acute diverticulitis or colitis. No abscess or extraluminal gas. Stomach and small bowel loops unremarkable. Vascular/Lymphatic: Atherosclerotic  calcifications aorta and iliac arteries. Coronary arterial calcifications. Aorta normal caliber. No adenopathy. Reproductive: Uterus surgically absent.  Normal appearing ovaries. Other: No free air or free fluid. Tiny LEFT parasagittal ventral hernia inferior to the umbilicus. Musculoskeletal: Demineralized with degenerative disc and facet disease changes lumbar spine. IMPRESSION: Diffuse colonic diverticulosis. Wall thickening of the rectosigmoid colon with mild pericolic infiltrative changes, question diverticulitis versus colitis. No evidence of perforation or abscess. LEFT adrenal adenoma 3.1 x 2.6 cm. Grossly unchanged appearance of an exophytic mass 3.7 x 3.6 cm at the lateral RIGHT kidney, question complicated cyst; lesion is unchanged since 05/24/2017 but minimally larger than seen on 02/10/2015 when it measured 3.5 x 3.2 cm, recommended attention on follow-up imaging to establish long-term stability. Electronically Signed   By: Lavonia Dana M.D.   On: 10/30/2017 13:03   Dg Abd 1 View  Result Date: 11/09/2017 CLINICAL DATA:  Patient pulled out nasogastric catheter EXAM: ABDOMEN - 1 VIEW COMPARISON:  11/07/2017 FINDINGS: Scattered large and small bowel gas is noted. The previously seen nasogastric catheter has been removed in the interval. Despite the given clinical history of metal tipped catheter this catheter on previous images represented a standard nasogastric catheter without metallic weighting. No foreign body is noted. IMPRESSION: No evidence of retained foreign body as the prior nasogastric catheter was not a weighted tipped catheter based on previous images. Electronically Signed   By: Inez Catalina M.D.   On: 11/09/2017 19:57   Dg Abd 1 View  Result Date: 11/07/2017 CLINICAL DATA:  84 year old female with feeding tube placement. EXAM: ABDOMEN - 1 VIEW COMPARISON:  Earlier abdominal radiograph dated 11/07/2017 FINDINGS: No significant change in the appearance or positioning of the enteric  tube which appears somewhat folded, likely in the mid to distal stomach. IMPRESSION: No significant interval change. Electronically Signed   By: Anner Crete M.D.   On: 11/07/2017 23:00   Dg Abd 1 View  Result Date: 11/03/2017  CLINICAL DATA:  Abdominal distension. EXAM: ABDOMEN - 1 VIEW COMPARISON:  CT, 10/30/2017 FINDINGS: Normal bowel gas pattern with no evidence of obstruction. Soft tissues are not well-defined due to body habitus. No convincing renal or ureteral stones. Status post cholecystectomy. No acute skeletal abnormality. IMPRESSION: 1. No acute findings.  No evidence of bowel obstruction. Electronically Signed   By: Lajean Manes M.D.   On: 11/03/2017 11:17   Ct Head Wo Contrast  Result Date: 11/27/2017 CLINICAL DATA:  Altered mental status. History of hypertension, hepatitis, diabetes, edema. EXAM: CT HEAD WITHOUT CONTRAST TECHNIQUE: Contiguous axial images were obtained from the base of the skull through the vertex without intravenous contrast. COMPARISON:  MRI brain 11/29/2016. CT head 11/24/2016. FINDINGS: Brain: Diffuse cerebral atrophy. Ventricular dilatation consistent with central atrophy. Low-attenuation changes throughout the deep white matter consistent with small vessel ischemia. No mass-effect or midline shift. No abnormal extra-axial fluid collections. Gray-white matter junctions are distinct. Basal cisterns are not effaced. No acute intracranial hemorrhage. Vascular: Moderate intracranial arterial vascular calcifications are present. Skull: Calvarium appears intact. Sinuses/Orbits: Paranasal sinuses and mastoid air cells are clear. Other: None. IMPRESSION: No acute intracranial abnormalities. Chronic atrophy and small vessel ischemic changes. Electronically Signed   By: Lucienne Capers M.D.   On: 11/27/2017 03:02   Ir Fluoro Guide Cv Line Right  Result Date: 11/03/2017 INDICATION: End-stage renal disease. In need intravenous access for the initiation of hemodialysis.  EXAM: NON-TUNNELED CENTRAL VENOUS HEMODIALYSIS CATHETER PLACEMENT WITH ULTRASOUND AND FLUOROSCOPIC GUIDANCE COMPARISON:  Chest radiograph - 10/31/2017 MEDICATIONS: None FLUOROSCOPY TIME:  24 seconds (14 mGy) COMPLICATIONS: None immediate. PROCEDURE: Informed written consent was obtained from patient's family after a discussion of the risks, benefits, and alternatives to treatment. Questions regarding the procedure were encouraged and answered. The right neck and chest were prepped with chlorhexidine in a sterile fashion, and a sterile drape was applied covering the operative field. Maximum barrier sterile technique with sterile gowns and gloves were used for the procedure. A timeout was performed prior to the initiation of the procedure. After the overlying soft tissues were anesthetized, a small venotomy incision was created and a micropuncture kit was utilized to access the internal jugular vein. Real-time ultrasound guidance was utilized for vascular access including the acquisition of a permanent ultrasound image documenting patency of the accessed vessel. The microwire was utilized to measure appropriate catheter length. A stiff glidewire was advanced to the level of the IVC. Under fluoroscopic guidance, the venotomy was serially dilated, ultimately allowing placement of a 20 cm temporary Trialysis catheter with tip ultimately terminating within the superior aspect of the right atrium. Final catheter positioning was confirmed and documented with a spot radiographic image. The catheter aspirates and flushes normally. The catheter was flushed with appropriate volume heparin dwells. The catheter exit site was secured with a 0-Prolene retention suture. A dressing was placed. The patient tolerated the procedure well without immediate post procedural complication. IMPRESSION: Successful placement of a right internal jugular approach 20 cm temporary dialysis catheter with tip terminating with in the superior aspect of  the right atrium. The catheter is ready for immediate use. PLAN: This catheter may be converted to a tunneled dialysis catheter at a later date as indicated. Electronically Signed   By: Sandi Mariscal M.D.   On: 11/03/2017 18:01   Ir US Guide Vasc Access Right  Result Date: 11/03/2017 INDICATION: End-stage renal disease. In need intravenous access for the initiation of hemodialysis. EXAM: NON-TUNNELED CENTRAL VENOUS HEMODIALYSIS CATHETER PLACEMENT WITH  ULTRASOUND AND FLUOROSCOPIC GUIDANCE COMPARISON:  Chest radiograph - 10/31/2017 MEDICATIONS: None FLUOROSCOPY TIME:  24 seconds (14 mGy) COMPLICATIONS: None immediate. PROCEDURE: Informed written consent was obtained from patient's family after a discussion of the risks, benefits, and alternatives to treatment. Questions regarding the procedure were encouraged and answered. The right neck and chest were prepped with chlorhexidine in a sterile fashion, and a sterile drape was applied covering the operative field. Maximum barrier sterile technique with sterile gowns and gloves were used for the procedure. A timeout was performed prior to the initiation of the procedure. After the overlying soft tissues were anesthetized, a small venotomy incision was created and a micropuncture kit was utilized to access the internal jugular vein. Real-time ultrasound guidance was utilized for vascular access including the acquisition of a permanent ultrasound image documenting patency of the accessed vessel. The microwire was utilized to measure appropriate catheter length. A stiff glidewire was advanced to the level of the IVC. Under fluoroscopic guidance, the venotomy was serially dilated, ultimately allowing placement of a 20 cm temporary Trialysis catheter with tip ultimately terminating within the superior aspect of the right atrium. Final catheter positioning was confirmed and documented with a spot radiographic image. The catheter aspirates and flushes normally. The catheter  was flushed with appropriate volume heparin dwells. The catheter exit site was secured with a 0-Prolene retention suture. A dressing was placed. The patient tolerated the procedure well without immediate post procedural complication. IMPRESSION: Successful placement of a right internal jugular approach 20 cm temporary dialysis catheter with tip terminating with in the superior aspect of the right atrium. The catheter is ready for immediate use. PLAN: This catheter may be converted to a tunneled dialysis catheter at a later date as indicated. Electronically Signed   By: Sandi Mariscal M.D.   On: 11/03/2017 18:01   Dg Chest Port 1 View  Result Date: 11/28/2017 CLINICAL DATA:  Hypoxia. EXAM: PORTABLE CHEST 1 VIEW COMPARISON:  Radiograph November 26, 2017. FINDINGS: Stable cardiomegaly is noted with central pulmonary vascular congestion. No pneumothorax or pleural effusion is noted. Possible mild bilateral pulmonary edema may be present. Bony thorax is unremarkable. IMPRESSION: Stable cardiomegaly with central pulmonary vascular congestion and possible mild bilateral pulmonary edema. Electronically Signed   By: Marijo Conception, M.D.   On: 11/28/2017 12:55   Dg Chest Port 1 View  Result Date: 11/26/2017 CLINICAL DATA:  Altered mental status EXAM: PORTABLE CHEST 1 VIEW COMPARISON:  11/13/2017 FINDINGS: AP portable semi upright view of the chest. The patient is rotated to the right. Stable cardiomegaly with interstitial edema. No pulmonary confluence, effusion or pneumothorax. No acute osseous abnormality. IMPRESSION: Cardiomegaly with interstitial edema.  No significant change. Electronically Signed   By: Ashley Royalty M.D.   On: 11/26/2017 22:48   Dg Chest Port 1 View  Result Date: 11/13/2017 CLINICAL DATA:  Shortness of breath EXAM: PORTABLE CHEST 1 VIEW COMPARISON:  11/09/2017 FINDINGS: Cardiomegaly and slightly increased pulmonary vascular congestion noted. Mild bibasilar opacities/atelectasis noted in this  low volume film. A RIGHT central venous catheter with tips overlying the LOWER SVC/SUPERIOR cavoatrial junction noted. No pneumothorax. IMPRESSION: Cardiomegaly with pulmonary vascular congestion and mild bibasilar opacities/atelectasis. Electronically Signed   By: Margarette Canada M.D.   On: 11/13/2017 07:49   Dg Chest Port 1 View  Result Date: 11/09/2017 CLINICAL DATA:  Patient removed recent nasogastric catheter EXAM: PORTABLE CHEST 1 VIEW COMPARISON:  11/08/2017 FINDINGS: Cardiac shadow is enlarged. Right jugular central line is again seen and  stable. Inspiratory effort is poor with crowding of the vascular markings. No focal confluent infiltrate is seen. No radiopaque foreign body is noted. The previously seen nasogastric catheter has been removed and as previously described was not a weighted tipped catheter. IMPRESSION: Poor inspiratory effort.  No acute abnormality noted. Electronically Signed   By: Inez Catalina M.D.   On: 11/09/2017 19:58   Dg Chest Port 1 View  Result Date: 11/08/2017 CLINICAL DATA:  Respiratory failure. EXAM: PORTABLE CHEST 1 VIEW COMPARISON:  11/07/2017 and older exams. FINDINGS: Mild enlargement of the cardiopericardial silhouette, stable. Prominent bronchovascular markings, with additional lung base opacity, the latter consistent with atelectasis, also without change from the previous day's exam. New nasal/orogastric tube passes below the included field of view, likely into the stomach. Right subclavian dual lumen central venous catheter is stable, tip in the right atrium. No pneumothorax. IMPRESSION: 1. No significant change in lung aeration from the previous day's study. There are prominent bronchovascular markings and lung base atelectasis, but no convincing pulmonary edema or pneumonia. 2. New nasal/orogastric tube. Tip not visualized, but likely extends into the stomach. Electronically Signed   By: Lajean Manes M.D.   On: 11/08/2017 07:13   Dg Chest Port 1 View  Result  Date: 11/07/2017 CLINICAL DATA:  Acute respiratory failure with hypoxia. EXAM: PORTABLE CHEST 1 VIEW COMPARISON:  10/31/2017. FINDINGS: Cardiomegaly. Improved lung volumes. Increased perihilar markings could represent early edema or viral pneumonitis. Other than improved lung markings, similar appearance to priors. Dialysis catheter has been inserted via RIGHT IJ approach. Tip slide in the RIGHT atrium. There is no pneumothorax. IMPRESSION: Satisfactory catheter placement. No significant change in the appearance of the lungs except for improved volume. Cardiomegaly. Electronically Signed   By: Staci Righter M.D.   On: 11/07/2017 11:30   Dg Chest Port 1 View  Result Date: 10/31/2017 CLINICAL DATA:  84 year old female with shortness of breath. EXAM: PORTABLE CHEST 1 VIEW COMPARISON:  Portable chest 10/30/2017 and earlier. CT Abdomen and Pelvis 10/30/2017. FINDINGS: There did not appear to be overt pulmonary edema at the lung bases yesterday by CT. Lower lobe bronchiectasis and peribronchial thickening was noted. Continued low lung volumes stable appearance of the pulmonary interstitium today from May this year. Stable mild cardiomegaly. Visualized tracheal air column is within normal limits. No pneumothorax, pleural effusion or confluent opacity. Large body habitus. No acute osseous abnormality identified. IMPRESSION: Persistent low lung volumes. No focal pulmonary abnormality. Consider viral or atypical respiratory infection given the appearance of the lung bases on CT Abdomen and Pelvis yesterday. Electronically Signed   By: Genevie Ann M.D.   On: 10/31/2017 13:18   Dg Chest Port 1 View  Result Date: 10/30/2017 CLINICAL DATA:  84 year old female with constipation and watery diarrhea. Initial encounter. EXAM: PORTABLE CHEST 1 VIEW COMPARISON:  05/27/2017. FINDINGS: Cardiomegaly. Asymmetric mild pulmonary edema superimposed upon chronic changes. Calcified aorta. IMPRESSION: 1. Asymmetric mild pulmonary edema.  2. Cardiomegaly. 3.  Aortic Atherosclerosis (ICD10-I70.0). Electronically Signed   By: Genia Del M.D.   On: 10/30/2017 15:06   Dg Abd Portable 1v  Result Date: 11/07/2017 CLINICAL DATA:  Nasogastric tube placement. EXAM: PORTABLE ABDOMEN - 1 VIEW COMPARISON:  11/03/2017 FINDINGS: Interval nasogastric tube folded back upon itself in the mid to distal stomach with its tip in the mid stomach. The included bowel gas pattern is normal. Prominent interstitial markings at the left lung base. Lower thoracic spine degenerative changes. IMPRESSION: Nasogastric tube folded back upon itself in the  mid to distal stomach with its tip in the mid stomach. Electronically Signed   By: Claudie Revering M.D.   On: 11/07/2017 13:13     Phillips Climes M.D on 11/29/2017 at 12:34 PM  Between 7am to 7pm - Pager - [phone]  After 7pm go to [email or web address] - password [facility]  [facility] -  Office  [phone]

## 2017-11-30 ENCOUNTER — Inpatient Hospital Stay (HOSPITAL_COMMUNITY): Payer: Medicare HMO

## 2017-11-30 DIAGNOSIS — L8990 Pressure ulcer of unspecified site, unspecified stage: Secondary | ICD-10-CM

## 2017-11-30 DIAGNOSIS — I5030 Unspecified diastolic (congestive) heart failure: Secondary | ICD-10-CM

## 2017-11-30 LAB — GLUCOSE, CAPILLARY
GLUCOSE-CAPILLARY: 155 mg/dL — AB (ref 70–99)
GLUCOSE-CAPILLARY: 179 mg/dL — AB (ref 70–99)
GLUCOSE-CAPILLARY: 221 mg/dL — AB (ref 70–99)
Glucose-Capillary: 124 mg/dL — ABNORMAL HIGH (ref 70–99)
Glucose-Capillary: 125 mg/dL — ABNORMAL HIGH (ref 70–99)

## 2017-11-30 LAB — BASIC METABOLIC PANEL
ANION GAP: 8 (ref 5–15)
BUN: 32 mg/dL — AB (ref 8–23)
CALCIUM: 8.3 mg/dL — AB (ref 8.9–10.3)
CO2: 27 mmol/L (ref 22–32)
CREATININE: 1.53 mg/dL — AB (ref 0.44–1.00)
Chloride: 114 mmol/L — ABNORMAL HIGH (ref 98–111)
GFR calc Af Amer: 35 mL/min — ABNORMAL LOW (ref >60)
GFR, EST NON AFRICAN AMERICAN: 30 mL/min — AB (ref >60)
GLUCOSE: 121 mg/dL — AB (ref 70–99)
Potassium: 3.4 mmol/L — ABNORMAL LOW (ref 3.5–5.1)
Sodium: 149 mmol/L — ABNORMAL HIGH (ref 135–145)

## 2017-11-30 LAB — CBC
HCT: 27 % — ABNORMAL LOW (ref 36.0–46.0)
Hemoglobin: 8.1 g/dL — ABNORMAL LOW (ref 12.0–15.0)
MCH: 22.1 pg — AB (ref 26.0–34.0)
MCHC: 30 g/dL (ref 30.0–36.0)
MCV: 73.8 fL — ABNORMAL LOW (ref 80.0–100.0)
NRBC: 0 % (ref 0.0–0.2)
PLATELETS: 100 10*3/uL — AB (ref 150–400)
RBC: 3.66 MIL/uL — ABNORMAL LOW (ref 3.87–5.11)
RDW: 20.2 % — AB (ref 11.5–15.5)
WBC: 3.4 10*3/uL — ABNORMAL LOW (ref 4.0–10.5)

## 2017-11-30 LAB — AMMONIA: Ammonia: 42 umol/L — ABNORMAL HIGH (ref 9–35)

## 2017-11-30 LAB — ECHOCARDIOGRAM COMPLETE: WEIGHTICAEL: 3700.2 [oz_av]

## 2017-11-30 MED ORDER — DEXTROSE 5 % IV SOLN
INTRAVENOUS | Status: DC
  Administered 2017-11-30: 09:00:00 via INTRAVENOUS

## 2017-11-30 MED ORDER — SODIUM CHLORIDE 0.9 % IV SOLN
2.0000 g | INTRAVENOUS | Status: DC
  Administered 2017-11-30: 2 g via INTRAVENOUS
  Filled 2017-11-30: qty 20

## 2017-11-30 NOTE — Progress Notes (Signed)
  Echocardiogram 2D Echocardiogram has been performed.  Lauren Crosby 11/30/2017, 12:13 PM

## 2017-11-30 NOTE — Progress Notes (Signed)
PROGRESS NOTE                                                                                                                                                                                                             Patient Demographics:    Lauren Crosby, is a 84 y.o. female, DOB - 1931-01-16, JSH:[id number]  Admit date - 11/26/2017   Admitting Physician Ivor Costa, MD  Outpatient Primary MD for the patient is [name], MD  LOS - 3   Chief Complaint  Patient presents with  . Altered Mental Status       Brief Narrative    84 y.o. female with medical history significant of hypertension, hyperlipidemia, diabetes mellitus, GERD, gout, iron deficiency anemia, diverticulitis, kidney stone, thalassemia minor, with recent hospitalization from 10/22-11/8 due to septic shock which was possibly due to C. difficile colitis. required CRRT in ICU.  Function recovered with no further need of dialysis. -Patient presents with confusion, fever, she did have episode of seizures as well, and decompensated respiratory failure where she required BiPAP.   Subjective:    Blanchie Serve with no significant events overnight, she remained off BiPAP overnight, she had urinary retention yesterday where she had Foley catheter inserted.  Assessment  & Plan :    Principal Problem:   Sepsis ([place]) Active Problems:   Depression   HTN (hypertension)   GERD   Chronic anemia   Iron deficiency anemia   CKD (chronic kidney disease), stage IV (HCC)   Acute metabolic encephalopathy   C. difficile diarrhea   Type II diabetes mellitus with renal manifestations (HCC)   Pressure injury of skin  SIRS -Patient presents with fever, tachycardia and tachypnea, and elevated lactic acid, no source of infection currently could be identified . -  CT abdomen/pelvis possible punctate stones in the distal left ureter, mild proximal hydronephrosis and hydroureter and gas in the  bladder, indicating possible urinary tract infection, but patient urinalysis negative.  -Procalcitonin is within normal limits, she is nontoxic-appearing, throwing antibiotics, initially on vancomycin, cefepime and Flagyl, mycin stopped yesterday, will change to Rocephin today, will DC antibiotics in 1 to 2 days if septic work-up remains negative   Acute hypercapnic respiratory failure -Patient with increased work of breathing, required BiPAP 11/27/2017, ABG showing PCO2 of 55,  -Imaging significant for volume overload, follow on  2D echo, she is improving on IV Lasix 60 mg twice daily , currently off BiPAP for last 24 hours, tolerating nasal cannula, at 6 L/min .  Seizures -seen By neurology, on IV Keppra, transition to p.o. if she passes swallow evaluation   Acute metabolic encephalopathy:  -CT head negative,  ammonia within normal level .  Initially significantly altered, psychiatry significantly improved more awake and communicative today, he had post ictal, MRI pending for further evaluation. -Neurology signed off.  Hypernatremia -Due to diuresis she is -2.3 L, and with current dose Lasix as she needs diuresis secondary to respiratory failure, will start on D5W 40 cc/h  Depression: -Hold all home oral medications until mental status improves  HTN (hypertension): -IV hydralazine PRN  GERD -IV pepcid  CKD (chronic kidney disease), stage IV ([place]): recent cre 1.6 on 11/8-->1.43 today. BUN 27.  -f/u by BMP  C. difficile diarrhea: no diarrhea currently -observe  Type II diabetes mellitus with renal manifestations ([place]): Last A1c 6.7 on 05/24/17, well controled. Patient is taking Lantus and NovoLog at home -will decrease Lantus dose from 10 to 7 units daily -SSI  Anemia of chronic kidney disease -Monitor hemoglobin closely, known history of thalassemia minor  Hypokalemia -Monitor closely   Code Status : DNR  Family Communication  : Discussed with husband at  bedside  Disposition Plan  : pending further work up  Consults  :  PCCM  Procedures  : neurology  DVT Prophylaxis  :  Brooks lovenox  Lab Results  Component Value Date   PLT 100 (L) 11/30/2017    Antibiotics  :    Anti-infectives (From admission, onward)   Start     Dose/Rate Route Frequency Ordered Stop   11/29/17 2200  vancomycin (VANCOCIN) 1,500 mg in sodium chloride 0.9 % 500 mL IVPB  Status:  Discontinued     1,500 mg 250 mL/hr over 120 Minutes Intravenous Every 48 hours 11/28/17 1418 11/29/17 1242   11/28/17 1800  ceFEPIme (MAXIPIME) 1 g in sodium chloride 0.9 % 100 mL IVPB     1 g 200 mL/hr over 30 Minutes Intravenous Every 24 hours 11/28/17 1418     11/28/17 0000  vancomycin (VANCOCIN) 1,250 mg in sodium chloride 0.9 % 250 mL IVPB  Status:  Discontinued     1,250 mg 166.7 mL/hr over 90 Minutes Intravenous Every 24 hours 11/27/17 0159 11/28/17 1418   11/27/17 1800  ceFEPIme (MAXIPIME) 2 g in sodium chloride 0.9 % 100 mL IVPB  Status:  Discontinued     2 g 200 mL/hr over 30 Minutes Intravenous Every 24 hours 11/27/17 1502 11/28/17 1418   11/27/17 0200  metroNIDAZOLE (FLAGYL) IVPB 500 mg     500 mg 100 mL/hr over 60 Minutes Intravenous Every 8 hours 11/27/17 0126     11/27/17 0200  aztreonam (AZACTAM) 1 g in sodium chloride 0.9 % 100 mL IVPB  Status:  Discontinued     1 g 200 mL/hr over 30 Minutes Intravenous Every 8 hours 11/27/17 0159 11/27/17 1502   11/27/17 0200  vancomycin (VANCOCIN) 2,000 mg in sodium chloride 0.9 % 500 mL IVPB     2,000 mg 250 mL/hr over 120 Minutes Intravenous  Once 11/27/17 0159 11/27/17 0606   11/26/17 2200  cefTRIAXone (ROCEPHIN) 2 g in sodium chloride 0.9 % 100 mL IVPB  Status:  Discontinued     2 g 200 mL/hr over 30 Minutes Intravenous Every 24 hours 11/26/17 2140 11/27/17 0125  Objective:   Vitals:   11/30/17 0456 11/30/17 0716 11/30/17 0750 11/30/17 0819  BP:      Pulse:      Resp:      Temp: 97.8 F (36.6 C)   98.6 F (37  C)  TempSrc:    Axillary  SpO2:  100%    Weight:   104.9 kg     Wt Readings from Last 3 Encounters:  11/30/17 104.9 kg  11/22/17 109.8 kg  11/19/17 109.8 kg     Intake/Output Summary (Last 24 hours) at 11/30/2017 1145 Last data filed at 11/30/2017 1122 Gross per 24 hour  Intake 917.76 ml  Output 4550 ml  Net -3632.24 ml     Physical Exam  Is more awake today, answering questions, but she remains confused  Symmetrical Chest wall movement, air entry at the bases, minimal crackles, no wheezing RRR,No Gallops,Rubs or new Murmurs, No Parasternal Heave +ve B.Sounds, Abd Soft, No tenderness, No rebound - guarding or rigidity. No Cyanosis,  has chronic lower extremity skin changes, has trace edema    Data Review:    CBC Recent Labs  Lab 11/26/17 2152 11/27/17 0327 11/28/17 0327 11/29/17 0256 11/30/17 0401  WBC 7.2 8.3 4.4 4.5 3.4*  HGB 10.2* 9.5* 7.6* 8.0* 8.1*  HCT 33.4* 33.0* 25.9* 27.0* 27.0*  PLT 160 160 107* 104* 100*  MCV 73.1* 74.0* 74.4* 74.8* 73.8*  MCH 22.3* 21.3* 21.8* 22.2* 22.1*  MCHC 30.5 28.8* 29.3* 29.6* 30.0  RDW 20.0* 20.1* 20.6* 20.7* 20.2*  LYMPHSABS 0.6*  --   --   --   --   MONOABS 0.2  --   --   --   --   EOSABS 0.0  --   --   --   --   BASOSABS 0.0  --   --   --   --     Chemistries  Recent Labs  Lab 11/26/17 2152 11/27/17 0327 11/28/17 0327 11/29/17 0256 11/30/17 0401  NA 138 139 143 144 149*  K 4.2 4.0 3.0* 3.7 3.4*  CL 96* 98 108 111 114*  CO2 _0 GLUCOSE 300* 340* 205* 137* 121*  BUN 27* 26* 35* 37* 32*  CREATININE 1.43* 1.55* 1.73* 1.56* 1.53*  CALCIUM 9.3 8.8* 7.6* 7.7* 8.3*  AST 29  --   --   --   --   ALT 21  --   --   --   --   ALKPHOS 137*  --   --   --   --   BILITOT 1.0  --   --   --   --    ------------------------------------------------------------------------------------------------------------------ No results for input(s): CHOL, HDL, LDLCALC, TRIG, CHOLHDL, LDLDIRECT in the last 72  hours.  Lab Results  Component Value Date   HGBA1C 6.7 (H) 05/24/2017   ------------------------------------------------------------------------------------------------------------------ No results for input(s): TSH, T4TOTAL, T3FREE, THYROIDAB in the last 72 hours.  Invalid input(s): FREET3 ------------------------------------------------------------------------------------------------------------------ Recent Labs    11/28/17 1202  VITAMINB12 523  FOLATE 34.8  FERRITIN 895*  TIBC 213*  IRON 79  RETICCTPCT 1.8    Coagulation profile Recent Labs  Lab 11/27/17 0327  INR 1.20    No results for input(s): DDIMER in the last 72 hours.  Cardiac Enzymes No results for input(s): CKMB, TROPONINI, MYOGLOBIN in the last 168 hours.  Invalid input(s): CK ------------------------------------------------------------------------------------------------------------------    Component Value Date/Time   BNP 859.9 (H) 11/27/2017 0327   BNP 18.5 08/14/2012 0930  Inpatient Medications  Scheduled Meds: . chlorhexidine  15 mL Mouth Rinse BID  . furosemide  60 mg Intravenous Q12H  . heparin  5,000 Units Subcutaneous Q8H  . insulin aspart  0-5 Units Subcutaneous QHS  . insulin aspart  0-9 Units Subcutaneous TID WC  . insulin glargine  7 Units Subcutaneous QHS  . ipratropium  0.5 mg Nebulization BID  . levalbuterol  1.25 mg Nebulization BID  . mouth rinse  15 mL Mouth Rinse q12n4p   Continuous Infusions: . sodium chloride Stopped (11/28/17 1618)  . ceFEPime (MAXIPIME) IV Stopped (11/29/17 1739)  . dextrose 25 mL/hr at 11/30/17 0839  . famotidine (PEPCID) IV 20 mg (11/30/17 0835)  . levETIRAcetam Stopped (11/30/17 0319)  . metronidazole 500 mg (11/30/17 0958)   PRN Meds:.sodium chloride, acetaminophen **OR** acetaminophen, hydrALAZINE, LORazepam, ondansetron **OR** ondansetron (ZOFRAN) IV  Micro Results Recent Results (from the past 240 hour(s))  Blood Culture (routine x 2)      Status: None (Preliminary result)   Collection Time: 11/26/17  9:52 PM  Result Value Ref Range Status   Specimen Description BLOOD RIGHT HAND  Final   Special Requests   Final    BOTTLES DRAWN AEROBIC AND ANAEROBIC Blood Culture adequate volume   Culture   Final    NO GROWTH 3 DAYS Performed at [facility], [place] [address] [place], [place] [address]    Report Status PENDING  Incomplete  Urine culture     Status: None   Collection Time: 11/26/17 10:37 PM  Result Value Ref Range Status   Specimen Description URINE, CATHETERIZED  Final   Special Requests NONE  Final   Culture   Final    NO GROWTH Performed at [facility], [address] [place], [place] [address]    Report Status 11/28/2017 FINAL  Final  Blood Culture (routine x 2)     Status: None (Preliminary result)   Collection Time: 11/26/17 11:39 PM  Result Value Ref Range Status   Specimen Description BLOOD LEFT FOREARM  Final   Special Requests   Final    BOTTLES DRAWN AEROBIC AND ANAEROBIC Blood Culture adequate volume   Culture   Final    NO GROWTH 2 DAYS Performed at [facility], [place] [address], [place],  [address]    Report Status PENDING  Incomplete  Respiratory Panel by PCR     Status: None   Collection Time: 11/27/17  1:38 PM  Result Value Ref Range Status   Adenovirus NOT DETECTED NOT DETECTED Final   Coronavirus 229E NOT DETECTED NOT DETECTED Final   Coronavirus HKU1 NOT DETECTED NOT DETECTED Final   Coronavirus NL63 NOT DETECTED NOT DETECTED Final   Coronavirus OC43 NOT DETECTED NOT DETECTED Final   Metapneumovirus NOT DETECTED NOT DETECTED Final   Rhinovirus / Enterovirus NOT DETECTED NOT DETECTED Final   Influenza A NOT DETECTED NOT DETECTED Final   Influenza B NOT DETECTED NOT DETECTED Final   Parainfluenza Virus 1 NOT DETECTED NOT DETECTED Final   Parainfluenza Virus 2 NOT DETECTED NOT DETECTED Final   Parainfluenza Virus 3 NOT DETECTED NOT DETECTED Final   Parainfluenza  Virus 4 NOT DETECTED NOT DETECTED Final   Respiratory Syncytial Virus NOT DETECTED NOT DETECTED Final   Bordetella pertussis NOT DETECTED NOT DETECTED Final   Chlamydophila pneumoniae NOT DETECTED NOT DETECTED Final   Mycoplasma pneumoniae NOT DETECTED NOT DETECTED Final    Comment: Performed at [facility], [address] [place], [place] [address]    Radiology Reports Ct Abdomen Pelvis Wo Contrast  Result Date: 11/27/2017 CLINICAL DATA:  Abdominal distention, altered mental status, sepsis. History of Clostridium difficile colitis, open wound of the abdominal wall, nephrolithiasis, hernia, gastroesophageal reflux disease, diverticulitis, colon polyp, cellulitis and abscess, adrenal adenoma, ventral hernia repair, hysterectomy, sigmoid resection, cholecystectomy, appendectomy. EXAM: CT ABDOMEN AND PELVIS WITHOUT CONTRAST TECHNIQUE: Multidetector CT imaging of the abdomen and pelvis was performed following the standard protocol without IV contrast. COMPARISON:  10/30/2017 FINDINGS: Lower chest: Evaluation is limited due to motion artifact. Atelectasis or consolidation in the lung bases, greater on the left. Coronary artery calcifications. Calcification in the mitral valve annulus. Hepatobiliary: No focal liver abnormality is seen. Status post cholecystectomy. No biliary dilatation. Pancreas: Fatty infiltration of the pancreas. Spleen: Normal in size without focal abnormality. Adrenals/Urinary Tract: 3.1 cm diameter left adrenal gland nodule. No change since previous study. Fat attenuation consistent with benign adenoma. Bilateral renal parenchymal atrophy. Mild left hydronephrosis and hydroureter with tiny punctate stones suggested in the distal left ureter. Bladder wall is not thickened. Gas in the bladder may come from instrumentation or infection. Exophytic lesion arising from the right kidney without change since prior study. Hyperdensity likely indicating a hemorrhagic cyst. Stomach/Bowel:  Stomach, small bowel, and colon are not abnormally distended. Scattered stool throughout the colon. Scattered colonic diverticula. No evidence of diverticulitis. Prominent stool in the rectum with mild rectal wall thickening may indicate stercoral colitis. Infiltration in the presacral fat is unchanged since prior study, possibly inflammatory or reactive. Vascular/Lymphatic: Aortic atherosclerosis. No enlarged abdominal or pelvic lymph nodes. Reproductive: Status post hysterectomy. No adnexal masses. Other: Small amount of free fluid around the liver and spleen. Likely ascites. Scarring in the anterior abdominal wall consistent with postoperative change. Edema in the subcutaneous fat. No free air in the abdomen. Musculoskeletal: Degenerative changes in the spine. No destructive bone lesions. IMPRESSION: 1. Atelectasis or consolidation in the lung bases, greater on the left. 2. Left adrenal gland adenoma. 3. Bilateral renal parenchymal atrophy. Punctate stones suggested in the distal left ureter. Mild proximal hydronephrosis and hydroureter. Gas in the bladder may be due to instrumentation or infection. 4. Prominent stool in the rectum with rectal wall thickening suggesting possible stercoral colitis. 5. Small amount of free fluid in the abdomen and pelvis, likely ascites. 6. Probable hemorrhagic cysts in the right kidney. Aortic Atherosclerosis (ICD10-I70.0). Electronically Signed   By: Lucienne Capers M.D.   On: 11/27/2017 03:10   Dg Abd 1 View  Result Date: 11/09/2017 CLINICAL DATA:  Patient pulled out nasogastric catheter EXAM: ABDOMEN - 1 VIEW COMPARISON:  11/07/2017 FINDINGS: Scattered large and small bowel gas is noted. The previously seen nasogastric catheter has been removed in the interval. Despite the given clinical history of metal tipped catheter this catheter on previous images represented a standard nasogastric catheter without metallic weighting. No foreign body is noted. IMPRESSION: No evidence  of retained foreign body as the prior nasogastric catheter was not a weighted tipped catheter based on previous images. Electronically Signed   By: Inez Catalina M.D.   On: 11/09/2017 19:57   Dg Abd 1 View  Result Date: 11/07/2017 CLINICAL DATA:  84 year old female with feeding tube placement. EXAM: ABDOMEN - 1 VIEW COMPARISON:  Earlier abdominal radiograph dated 11/07/2017 FINDINGS: No significant change in the appearance or positioning of the enteric tube which appears somewhat folded, likely in the mid to distal stomach. IMPRESSION: No significant interval change. Electronically Signed  By: Anner Crete M.D.   On: 11/07/2017 23:00   Dg Abd 1 View  Result Date: 11/03/2017 CLINICAL DATA:  Abdominal distension. EXAM: ABDOMEN - 1 VIEW COMPARISON:  CT, 10/30/2017 FINDINGS: Normal bowel gas pattern with no evidence of obstruction. Soft tissues are not well-defined due to body habitus. No convincing renal or ureteral stones. Status post cholecystectomy. No acute skeletal abnormality. IMPRESSION: 1. No acute findings.  No evidence of bowel obstruction. Electronically Signed   By: Lajean Manes M.D.   On: 11/03/2017 11:17   Ct Head Wo Contrast  Result Date: 11/27/2017 CLINICAL DATA:  Altered mental status. History of hypertension, hepatitis, diabetes, edema. EXAM: CT HEAD WITHOUT CONTRAST TECHNIQUE: Contiguous axial images were obtained from the base of the skull through the vertex without intravenous contrast. COMPARISON:  MRI brain 11/29/2016. CT head 11/24/2016. FINDINGS: Brain: Diffuse cerebral atrophy. Ventricular dilatation consistent with central atrophy. Low-attenuation changes throughout the deep white matter consistent with small vessel ischemia. No mass-effect or midline shift. No abnormal extra-axial fluid collections. Gray-white matter junctions are distinct. Basal cisterns are not effaced. No acute intracranial hemorrhage. Vascular: Moderate intracranial arterial vascular calcifications  are present. Skull: Calvarium appears intact. Sinuses/Orbits: Paranasal sinuses and mastoid air cells are clear. Other: None. IMPRESSION: No acute intracranial abnormalities. Chronic atrophy and small vessel ischemic changes. Electronically Signed   By: Lucienne Capers M.D.   On: 11/27/2017 03:02   Ir Fluoro Guide Cv Line Right  Result Date: 11/03/2017 INDICATION: End-stage renal disease. In need intravenous access for the initiation of hemodialysis. EXAM: NON-TUNNELED CENTRAL VENOUS HEMODIALYSIS CATHETER PLACEMENT WITH ULTRASOUND AND FLUOROSCOPIC GUIDANCE COMPARISON:  Chest radiograph - 10/31/2017 MEDICATIONS: None FLUOROSCOPY TIME:  24 seconds (14 mGy) COMPLICATIONS: None immediate. PROCEDURE: Informed written consent was obtained from patient's family after a discussion of the risks, benefits, and alternatives to treatment. Questions regarding the procedure were encouraged and answered. The right neck and chest were prepped with chlorhexidine in a sterile fashion, and a sterile drape was applied covering the operative field. Maximum barrier sterile technique with sterile gowns and gloves were used for the procedure. A timeout was performed prior to the initiation of the procedure. After the overlying soft tissues were anesthetized, a small venotomy incision was created and a micropuncture kit was utilized to access the internal jugular vein. Real-time ultrasound guidance was utilized for vascular access including the acquisition of a permanent ultrasound image documenting patency of the accessed vessel. The microwire was utilized to measure appropriate catheter length. A stiff glidewire was advanced to the level of the IVC. Under fluoroscopic guidance, the venotomy was serially dilated, ultimately allowing placement of a 20 cm temporary Trialysis catheter with tip ultimately terminating within the superior aspect of the right atrium. Final catheter positioning was confirmed and documented with a spot  radiographic image. The catheter aspirates and flushes normally. The catheter was flushed with appropriate volume heparin dwells. The catheter exit site was secured with a 0-Prolene retention suture. A dressing was placed. The patient tolerated the procedure well without immediate post procedural complication. IMPRESSION: Successful placement of a right internal jugular approach 20 cm temporary dialysis catheter with tip terminating with in the superior aspect of the right atrium. The catheter is ready for immediate use. PLAN: This catheter may be converted to a tunneled dialysis catheter at a later date as indicated. Electronically Signed   By: Sandi Mariscal M.D.   On: 11/03/2017 18:01   Ir US Guide Vasc Access Right  Result Date: 11/03/2017 INDICATION:  End-stage renal disease. In need intravenous access for the initiation of hemodialysis. EXAM: NON-TUNNELED CENTRAL VENOUS HEMODIALYSIS CATHETER PLACEMENT WITH ULTRASOUND AND FLUOROSCOPIC GUIDANCE COMPARISON:  Chest radiograph - 10/31/2017 MEDICATIONS: None FLUOROSCOPY TIME:  24 seconds (14 mGy) COMPLICATIONS: None immediate. PROCEDURE: Informed written consent was obtained from patient's family after a discussion of the risks, benefits, and alternatives to treatment. Questions regarding the procedure were encouraged and answered. The right neck and chest were prepped with chlorhexidine in a sterile fashion, and a sterile drape was applied covering the operative field. Maximum barrier sterile technique with sterile gowns and gloves were used for the procedure. A timeout was performed prior to the initiation of the procedure. After the overlying soft tissues were anesthetized, a small venotomy incision was created and a micropuncture kit was utilized to access the internal jugular vein. Real-time ultrasound guidance was utilized for vascular access including the acquisition of a permanent ultrasound image documenting patency of the accessed vessel. The microwire  was utilized to measure appropriate catheter length. A stiff glidewire was advanced to the level of the IVC. Under fluoroscopic guidance, the venotomy was serially dilated, ultimately allowing placement of a 20 cm temporary Trialysis catheter with tip ultimately terminating within the superior aspect of the right atrium. Final catheter positioning was confirmed and documented with a spot radiographic image. The catheter aspirates and flushes normally. The catheter was flushed with appropriate volume heparin dwells. The catheter exit site was secured with a 0-Prolene retention suture. A dressing was placed. The patient tolerated the procedure well without immediate post procedural complication. IMPRESSION: Successful placement of a right internal jugular approach 20 cm temporary dialysis catheter with tip terminating with in the superior aspect of the right atrium. The catheter is ready for immediate use. PLAN: This catheter may be converted to a tunneled dialysis catheter at a later date as indicated. Electronically Signed   By: Sandi Mariscal M.D.   On: 11/03/2017 18:01   Dg Chest Port 1 View  Result Date: 11/30/2017 CLINICAL DATA:  Shortness of breath, hypertension EXAM: PORTABLE CHEST 1 VIEW COMPARISON:  11/28/2017 FINDINGS: Cardiomegaly with vascular congestion and probable mild pulmonary edema. Findings are similar prior study. Suspect small layering effusions. No acute bony abnormality. IMPRESSION: Cardiomegaly with mild pulmonary edema and small layering effusions. No real change since prior study. Electronically Signed   By: Rolm Baptise M.D.   On: 11/30/2017 08:54   Dg Chest Port 1 View  Result Date: 11/28/2017 CLINICAL DATA:  Hypoxia. EXAM: PORTABLE CHEST 1 VIEW COMPARISON:  Radiograph November 26, 2017. FINDINGS: Stable cardiomegaly is noted with central pulmonary vascular congestion. No pneumothorax or pleural effusion is noted. Possible mild bilateral pulmonary edema may be present. Bony thorax  is unremarkable. IMPRESSION: Stable cardiomegaly with central pulmonary vascular congestion and possible mild bilateral pulmonary edema. Electronically Signed   By: Marijo Conception, M.D.   On: 11/28/2017 12:55   Dg Chest Port 1 View  Result Date: 11/26/2017 CLINICAL DATA:  Altered mental status EXAM: PORTABLE CHEST 1 VIEW COMPARISON:  11/13/2017 FINDINGS: AP portable semi upright view of the chest. The patient is rotated to the right. Stable cardiomegaly with interstitial edema. No pulmonary confluence, effusion or pneumothorax. No acute osseous abnormality. IMPRESSION: Cardiomegaly with interstitial edema.  No significant change. Electronically Signed   By: Ashley Royalty M.D.   On: 11/26/2017 22:48   Dg Chest Port 1 View  Result Date: 11/13/2017 CLINICAL DATA:  Shortness of breath EXAM: PORTABLE CHEST 1 VIEW COMPARISON:  11/09/2017 FINDINGS: Cardiomegaly and slightly increased pulmonary vascular congestion noted. Mild bibasilar opacities/atelectasis noted in this low volume film. A RIGHT central venous catheter with tips overlying the LOWER SVC/SUPERIOR cavoatrial junction noted. No pneumothorax. IMPRESSION: Cardiomegaly with pulmonary vascular congestion and mild bibasilar opacities/atelectasis. Electronically Signed   By: Margarette Canada M.D.   On: 11/13/2017 07:49   Dg Chest Port 1 View  Result Date: 11/09/2017 CLINICAL DATA:  Patient removed recent nasogastric catheter EXAM: PORTABLE CHEST 1 VIEW COMPARISON:  11/08/2017 FINDINGS: Cardiac shadow is enlarged. Right jugular central line is again seen and stable. Inspiratory effort is poor with crowding of the vascular markings. No focal confluent infiltrate is seen. No radiopaque foreign body is noted. The previously seen nasogastric catheter has been removed and as previously described was not a weighted tipped catheter. IMPRESSION: Poor inspiratory effort.  No acute abnormality noted. Electronically Signed   By: Inez Catalina M.D.   On: 11/09/2017 19:58    Dg Chest Port 1 View  Result Date: 11/08/2017 CLINICAL DATA:  Respiratory failure. EXAM: PORTABLE CHEST 1 VIEW COMPARISON:  11/07/2017 and older exams. FINDINGS: Mild enlargement of the cardiopericardial silhouette, stable. Prominent bronchovascular markings, with additional lung base opacity, the latter consistent with atelectasis, also without change from the previous day's exam. New nasal/orogastric tube passes below the included field of view, likely into the stomach. Right subclavian dual lumen central venous catheter is stable, tip in the right atrium. No pneumothorax. IMPRESSION: 1. No significant change in lung aeration from the previous day's study. There are prominent bronchovascular markings and lung base atelectasis, but no convincing pulmonary edema or pneumonia. 2. New nasal/orogastric tube. Tip not visualized, but likely extends into the stomach. Electronically Signed   By: Lajean Manes M.D.   On: 11/08/2017 07:13   Dg Chest Port 1 View  Result Date: 11/07/2017 CLINICAL DATA:  Acute respiratory failure with hypoxia. EXAM: PORTABLE CHEST 1 VIEW COMPARISON:  10/31/2017. FINDINGS: Cardiomegaly. Improved lung volumes. Increased perihilar markings could represent early edema or viral pneumonitis. Other than improved lung markings, similar appearance to priors. Dialysis catheter has been inserted via RIGHT IJ approach. Tip slide in the RIGHT atrium. There is no pneumothorax. IMPRESSION: Satisfactory catheter placement. No significant change in the appearance of the lungs except for improved volume. Cardiomegaly. Electronically Signed   By: Staci Righter M.D.   On: 11/07/2017 11:30   Dg Chest Port 1 View  Result Date: 10/31/2017 CLINICAL DATA:  84 year old female with shortness of breath. EXAM: PORTABLE CHEST 1 VIEW COMPARISON:  Portable chest 10/30/2017 and earlier. CT Abdomen and Pelvis 10/30/2017. FINDINGS: There did not appear to be overt pulmonary edema at the lung bases yesterday by  CT. Lower lobe bronchiectasis and peribronchial thickening was noted. Continued low lung volumes stable appearance of the pulmonary interstitium today from May this year. Stable mild cardiomegaly. Visualized tracheal air column is within normal limits. No pneumothorax, pleural effusion or confluent opacity. Large body habitus. No acute osseous abnormality identified. IMPRESSION: Persistent low lung volumes. No focal pulmonary abnormality. Consider viral or atypical respiratory infection given the appearance of the lung bases on CT Abdomen and Pelvis yesterday. Electronically Signed   By: Genevie Ann M.D.   On: 10/31/2017 13:18   Dg Abd Portable 1v  Result Date: 11/07/2017 CLINICAL DATA:  Nasogastric tube placement. EXAM: PORTABLE ABDOMEN - 1 VIEW COMPARISON:  11/03/2017 FINDINGS: Interval nasogastric tube folded back upon itself in the mid to distal stomach with its tip in the mid stomach.  The included bowel gas pattern is normal. Prominent interstitial markings at the left lung base. Lower thoracic spine degenerative changes. IMPRESSION: Nasogastric tube folded back upon itself in the mid to distal stomach with its tip in the mid stomach. Electronically Signed   By: Claudie Revering M.D.   On: 11/07/2017 13:13     Phillips Climes M.D on 11/30/2017 at 11:45 AM  Between 7am to 7pm - Pager - [phone]  After 7pm go to [email or web address] - password [facility]  [facility] -  Office  [phone]                                          PROGRESS NOTE                                                                                                                                                                                                             Patient Demographics:    Lauren Crosby, is a 84 y.o. female, DOB - [birth year], TDH:[id number]  Admit date - 11/26/2017   Admitting Physician Ivor Costa, MD  Outpatient Primary MD for the patient is [name], MD  LOS - 3   Chief Complaint   Patient presents with  . Altered Mental Status       Brief Narrative     84 y.o. female with medical history significant of hypertension, hyperlipidemia, diabetes mellitus, GERD, gout, iron deficiency anemia, diverticulitis, kidney stone, thalassemia minor, with recent hospitalization from 10/22-11/8 due to septic shock which was possibly due to C. difficile colitis. Pt also had worsening renal function and required CRRT in ICU. Her kidney function has improved.  Her creatinine was 1.6 on 11/8. Pt was discharged on Lasix 40 mg daily to nursing home for rehab as stable condition. -Patient presents with confusion, fever, she did have episode of seizures as well, and decompensated respiratory failure where she is currently on BiPAP.   Subjective:    Lashay Osborne today is nonverbal, cannot provide any complaints, husband at bedside, denies any significant events.   Assessment  & Plan :    Principal Problem:   Sepsis ([place]) Active Problems:   Depression   HTN (hypertension)   GERD   Chronic anemia   Iron deficiency anemia   CKD (chronic kidney disease), stage IV (HCC)   Acute metabolic encephalopathy   C. difficile diarrhea   Type II diabetes mellitus with renal manifestations (HCC)   Pressure  injury of skin  SIRS -Patient presents with fever, tachycardia and tachypnea, and elevated lactic acid, no source of infection currently could be identified . -  CT abdomen/pelvis possible punctate stones in the distal left ureter, mild proximal hydronephrosis and hydroureter and gas in the bladder, indicating possible urinary tract infection, but patient urinalysis negative.  -Continue with broad-spectrum antibiotic coverage currently vancomycin, cefepime and Flagyl pending further work-up of her sepsis.  Acute hypercapnic respiratory failure -Patient with increased work of breathing, required BiPAP 11/27/2017, ABG showing PCO2 of 55, saturating well, will try off  BiPAP.  Seizures -neurology on board, continue with Keppra  Acute metabolic encephalopathy:  -CT head negative, patient not back to baseline, ammonia within normal level .  Depression: -Hold all home oral medications until mental status improves  HTN (hypertension): -IV hydralazine PRN  GERD -IV pepcid  Iron deficiency anemia: -hold iron supplement  CKD (chronic kidney disease), stage IV ([place]): recent cre 1.6 on 11/8-->1.43 today. BUN 27.  -f/u by BMP  C. difficile diarrhea: no diarrhea currently -observe  Type II diabetes mellitus with renal manifestations ([place]): Last A1c 6.7 on 05/24/17, well controled. Patient is taking Lantus and NovoLog at home -will decrease Lantus dose from 10 to 7 units daily -SSI  Anemia , unspecified -Obtain anemia panel, known history of thalassemia minor  Hypokalemia -Potassium of 3 today, will replete, recheck in a.m.   Code Status : DNR  Family Communication  : Discussed with husband at bedside  Disposition Plan  : pending further work up  Consults  :  PCCM  Procedures  : neurology  DVT Prophylaxis  :  [place] lovenox  Lab Results  Component Value Date   PLT 100 (L) 11/30/2017    Antibiotics  :    Anti-infectives (From admission, onward)   Start     Dose/Rate Route Frequency Ordered Stop   11/29/17 2200  vancomycin (VANCOCIN) 1,500 mg in sodium chloride 0.9 % 500 mL IVPB  Status:  Discontinued     1,500 mg 250 mL/hr over 120 Minutes Intravenous Every 48 hours 11/28/17 1418 11/29/17 1242   11/28/17 1800  ceFEPIme (MAXIPIME) 1 g in sodium chloride 0.9 % 100 mL IVPB     1 g 200 mL/hr over 30 Minutes Intravenous Every 24 hours 11/28/17 1418     11/28/17 0000  vancomycin (VANCOCIN) 1,250 mg in sodium chloride 0.9 % 250 mL IVPB  Status:  Discontinued     1,250 mg 166.7 mL/hr over 90 Minutes Intravenous Every 24 hours 11/27/17 0159 11/28/17 1418   11/27/17 1800  ceFEPIme (MAXIPIME) 2 g in sodium chloride 0.9 % 100 mL IVPB   Status:  Discontinued     2 g 200 mL/hr over 30 Minutes Intravenous Every 24 hours 11/27/17 1502 11/28/17 1418   11/27/17 0200  metroNIDAZOLE (FLAGYL) IVPB 500 mg     500 mg 100 mL/hr over 60 Minutes Intravenous Every 8 hours 11/27/17 0126     11/27/17 0200  aztreonam (AZACTAM) 1 g in sodium chloride 0.9 % 100 mL IVPB  Status:  Discontinued     1 g 200 mL/hr over 30 Minutes Intravenous Every 8 hours 11/27/17 0159 11/27/17 1502   11/27/17 0200  vancomycin (VANCOCIN) 2,000 mg in sodium chloride 0.9 % 500 mL IVPB     2,000 mg 250 mL/hr over 120 Minutes Intravenous  Once 11/27/17 0159 11/27/17 0606   11/26/17 2200  cefTRIAXone (ROCEPHIN) 2 g in sodium chloride 0.9 % 100 mL IVPB  Status:  Discontinued     2 g 200 mL/hr over 30 Minutes Intravenous Every 24 hours 11/26/17 2140 11/27/17 0125        Objective:   Vitals:   11/30/17 0456 11/30/17 0716 11/30/17 0750 11/30/17 0819  BP:      Pulse:      Resp:      Temp: 97.8 F (36.6 C)   98.6 F (37 C)  TempSrc:    Axillary  SpO2:  100%    Weight:   104.9 kg     Wt Readings from Last 3 Encounters:  11/30/17 104.9 kg  11/22/17 109.8 kg  11/19/17 109.8 kg     Intake/Output Summary (Last 24 hours) at 11/30/2017 1145 Last data filed at 11/30/2017 1122 Gross per 24 hour  Intake 917.76 ml  Output 4550 ml  Net -3632.24 ml     Physical Exam  Patient is somnolent, and BiPAP, in no apparent distress, moaning to sternal rub, otherwise somnolent .Marland Kitchen  Symmetrical Chest wall movement, diminished air entry at the bases, with rhonchi, no wheezing  RRR,No Gallops,Rubs or new Murmurs, No Parasternal Heave +ve B.Sounds, Abd Soft, No tenderness, , No rebound - guarding or rigidity. No Cyanosis, Clubbing, chronic lower extremity skin changes, with mild edema.    Data Review:    CBC Recent Labs  Lab 11/26/17 2152 11/27/17 0327 11/28/17 0327 11/29/17 0256 11/30/17 0401  WBC 7.2 8.3 4.4 4.5 3.4*  HGB 10.2* 9.5* 7.6* 8.0* 8.1*  HCT  33.4* 33.0* 25.9* 27.0* 27.0*  PLT 160 160 107* 104* 100*  MCV 73.1* 74.0* 74.4* 74.8* 73.8*  MCH 22.3* 21.3* 21.8* 22.2* 22.1*  MCHC 30.5 28.8* 29.3* 29.6* 30.0  RDW 20.0* 20.1* 20.6* 20.7* 20.2*  LYMPHSABS 0.6*  --   --   --   --   MONOABS 0.2  --   --   --   --   EOSABS 0.0  --   --   --   --   BASOSABS 0.0  --   --   --   --     Chemistries  Recent Labs  Lab 11/26/17 2152 11/27/17 0327 11/28/17 0327 11/29/17 0256 11/30/17 0401  NA 138 139 143 144 149*  K 4.2 4.0 3.0* 3.7 3.4*  CL 96* 98 108 111 114*  CO2 _0 GLUCOSE 300* 340* 205* 137* 121*  BUN 27* 26* 35* 37* 32*  CREATININE 1.43* 1.55* 1.73* 1.56* 1.53*  CALCIUM 9.3 8.8* 7.6* 7.7* 8.3*  AST 29  --   --   --   --   ALT 21  --   --   --   --   ALKPHOS 137*  --   --   --   --   BILITOT 1.0  --   --   --   --    ------------------------------------------------------------------------------------------------------------------ No results for input(s): CHOL, HDL, LDLCALC, TRIG, CHOLHDL, LDLDIRECT in the last 72 hours.  Lab Results  Component Value Date   HGBA1C 6.7 (H) 05/24/2017   ------------------------------------------------------------------------------------------------------------------ No results for input(s): TSH, T4TOTAL, T3FREE, THYROIDAB in the last 72 hours.  Invalid input(s): FREET3 ------------------------------------------------------------------------------------------------------------------ Recent Labs    11/28/17 1202  VITAMINB12 523  FOLATE 34.8  FERRITIN 895*  TIBC 213*  IRON 79  RETICCTPCT 1.8    Coagulation profile Recent Labs  Lab 11/27/17 0327  INR 1.20    No results for input(s): DDIMER in the last 72 hours.  Cardiac Enzymes  No results for input(s): CKMB, TROPONINI, MYOGLOBIN in the last 168 hours.  Invalid input(s): CK ------------------------------------------------------------------------------------------------------------------    Component Value  Date/Time   BNP 859.9 (H) 11/27/2017 0327   BNP 18.5 08/14/2012 0930    Inpatient Medications  Scheduled Meds: . chlorhexidine  15 mL Mouth Rinse BID  . furosemide  60 mg Intravenous Q12H  . heparin  5,000 Units Subcutaneous Q8H  . insulin aspart  0-5 Units Subcutaneous QHS  . insulin aspart  0-9 Units Subcutaneous TID WC  . insulin glargine  7 Units Subcutaneous QHS  . ipratropium  0.5 mg Nebulization BID  . levalbuterol  1.25 mg Nebulization BID  . mouth rinse  15 mL Mouth Rinse q12n4p   Continuous Infusions: . sodium chloride Stopped (11/28/17 1618)  . ceFEPime (MAXIPIME) IV Stopped (11/29/17 1739)  . dextrose 25 mL/hr at 11/30/17 0839  . famotidine (PEPCID) IV 20 mg (11/30/17 0835)  . levETIRAcetam Stopped (11/30/17 0319)  . metronidazole 500 mg (11/30/17 0958)   PRN Meds:.sodium chloride, acetaminophen **OR** acetaminophen, hydrALAZINE, LORazepam, ondansetron **OR** ondansetron (ZOFRAN) IV  Micro Results Recent Results (from the past 240 hour(s))  Blood Culture (routine x 2)     Status: None (Preliminary result)   Collection Time: 11/26/17  9:52 PM  Result Value Ref Range Status   Specimen Description BLOOD RIGHT HAND  Final   Special Requests   Final    BOTTLES DRAWN AEROBIC AND ANAEROBIC Blood Culture adequate volume   Culture   Final    NO GROWTH 3 DAYS Performed at [facility], [place] [address] [place], [place] [address]    Report Status PENDING  Incomplete  Urine culture     Status: None   Collection Time: 11/26/17 10:37 PM  Result Value Ref Range Status   Specimen Description URINE, CATHETERIZED  Final   Special Requests NONE  Final   Culture   Final    NO GROWTH Performed at [facility], [address] [place], [place] [address]    Report Status 11/28/2017 FINAL  Final  Blood Culture (routine x 2)     Status: None (Preliminary result)   Collection Time: 11/26/17 11:39 PM  Result Value Ref Range Status   Specimen Description BLOOD LEFT  FOREARM  Final   Special Requests   Final    BOTTLES DRAWN AEROBIC AND ANAEROBIC Blood Culture adequate volume   Culture   Final    NO GROWTH 2 DAYS Performed at [facility], [place] [address] [place], [place] [address]    Report Status PENDING  Incomplete  Respiratory Panel by PCR     Status: None   Collection Time: 11/27/17  1:38 PM  Result Value Ref Range Status   Adenovirus NOT DETECTED NOT DETECTED Final   Coronavirus 229E NOT DETECTED NOT DETECTED Final   Coronavirus HKU1 NOT DETECTED NOT DETECTED Final   Coronavirus NL63 NOT DETECTED NOT DETECTED Final   Coronavirus OC43 NOT DETECTED NOT DETECTED Final   Metapneumovirus NOT DETECTED NOT DETECTED Final   Rhinovirus / Enterovirus NOT DETECTED NOT DETECTED Final   Influenza A NOT DETECTED NOT DETECTED Final   Influenza B NOT DETECTED NOT DETECTED Final   Parainfluenza Virus 1 NOT DETECTED NOT DETECTED Final   Parainfluenza Virus 2 NOT DETECTED NOT DETECTED Final   Parainfluenza Virus 3 NOT DETECTED NOT DETECTED Final   Parainfluenza Virus 4 NOT DETECTED NOT DETECTED Final   Respiratory Syncytial Virus NOT DETECTED NOT DETECTED  Final   Bordetella pertussis NOT DETECTED NOT DETECTED Final   Chlamydophila pneumoniae NOT DETECTED NOT DETECTED Final   Mycoplasma pneumoniae NOT DETECTED NOT DETECTED Final    Comment: Performed at [facility], [place] [address] [place], [place] [address]    Radiology Reports Ct Abdomen Pelvis Wo Contrast  Result Date: 11/27/2017 CLINICAL DATA:  Abdominal distention, altered mental status, sepsis. History of Clostridium difficile colitis, open wound of the abdominal wall, nephrolithiasis, hernia, gastroesophageal reflux disease, diverticulitis, colon polyp, cellulitis and abscess, adrenal adenoma, ventral hernia repair, hysterectomy, sigmoid resection, cholecystectomy, appendectomy. EXAM: CT ABDOMEN AND PELVIS WITHOUT CONTRAST TECHNIQUE: Multidetector CT imaging of the abdomen and pelvis was  performed following the standard protocol without IV contrast. COMPARISON:  10/30/2017 FINDINGS: Lower chest: Evaluation is limited due to motion artifact. Atelectasis or consolidation in the lung bases, greater on the left. Coronary artery calcifications. Calcification in the mitral valve annulus. Hepatobiliary: No focal liver abnormality is seen. Status post cholecystectomy. No biliary dilatation. Pancreas: Fatty infiltration of the pancreas. Spleen: Normal in size without focal abnormality. Adrenals/Urinary Tract: 3.1 cm diameter left adrenal gland nodule. No change since previous study. Fat attenuation consistent with benign adenoma. Bilateral renal parenchymal atrophy. Mild left hydronephrosis and hydroureter with tiny punctate stones suggested in the distal left ureter. Bladder wall is not thickened. Gas in the bladder may come from instrumentation or infection. Exophytic lesion arising from the right kidney without change since prior study. Hyperdensity likely indicating a hemorrhagic cyst. Stomach/Bowel: Stomach, small bowel, and colon are not abnormally distended. Scattered stool throughout the colon. Scattered colonic diverticula. No evidence of diverticulitis. Prominent stool in the rectum with mild rectal wall thickening may indicate stercoral colitis. Infiltration in the presacral fat is unchanged since prior study, possibly inflammatory or reactive. Vascular/Lymphatic: Aortic atherosclerosis. No enlarged abdominal or pelvic lymph nodes. Reproductive: Status post hysterectomy. No adnexal masses. Other: Small amount of free fluid around the liver and spleen. Likely ascites. Scarring in the anterior abdominal wall consistent with postoperative change. Edema in the subcutaneous fat. No free air in the abdomen. Musculoskeletal: Degenerative changes in the spine. No destructive bone lesions. IMPRESSION: 1. Atelectasis or consolidation in the lung bases, greater on the left. 2. Left adrenal gland adenoma. 3.  Bilateral renal parenchymal atrophy. Punctate stones suggested in the distal left ureter. Mild proximal hydronephrosis and hydroureter. Gas in the bladder may be due to instrumentation or infection. 4. Prominent stool in the rectum with rectal wall thickening suggesting possible stercoral colitis. 5. Small amount of free fluid in the abdomen and pelvis, likely ascites. 6. Probable hemorrhagic cysts in the right kidney. Aortic Atherosclerosis (ICD10-I70.0). Electronically Signed   By: Lucienne Capers M.D.   On: 11/27/2017 03:10   Dg Abd 1 View  Result Date: 11/09/2017 CLINICAL DATA:  Patient pulled out nasogastric catheter EXAM: ABDOMEN - 1 VIEW COMPARISON:  11/07/2017 FINDINGS: Scattered large and small bowel gas is noted. The previously seen nasogastric catheter has been removed in the interval. Despite the given clinical history of metal tipped catheter this catheter on previous images represented a standard nasogastric catheter without metallic weighting. No foreign body is noted. IMPRESSION: No evidence of retained foreign body as the prior nasogastric catheter was not a weighted tipped catheter based on previous images. Electronically Signed   By: Inez Catalina M.D.   On: 11/09/2017 19:57   Dg Abd 1 View  Result Date: 11/07/2017 CLINICAL DATA:  84 year old female with feeding tube placement. EXAM: ABDOMEN - 1  VIEW COMPARISON:  Earlier abdominal radiograph dated 11/07/2017 FINDINGS: No significant change in the appearance or positioning of the enteric tube which appears somewhat folded, likely in the mid to distal stomach. IMPRESSION: No significant interval change. Electronically Signed   By: Anner Crete M.D.   On: 11/07/2017 23:00   Dg Abd 1 View  Result Date: 11/03/2017 CLINICAL DATA:  Abdominal distension. EXAM: ABDOMEN - 1 VIEW COMPARISON:  CT, 10/30/2017 FINDINGS: Normal bowel gas pattern with no evidence of obstruction. Soft tissues are not well-defined due to body habitus. No  convincing renal or ureteral stones. Status post cholecystectomy. No acute skeletal abnormality. IMPRESSION: 1. No acute findings.  No evidence of bowel obstruction. Electronically Signed   By: Lajean Manes M.D.   On: 11/03/2017 11:17   Ct Head Wo Contrast  Result Date: 11/27/2017 CLINICAL DATA:  Altered mental status. History of hypertension, hepatitis, diabetes, edema. EXAM: CT HEAD WITHOUT CONTRAST TECHNIQUE: Contiguous axial images were obtained from the base of the skull through the vertex without intravenous contrast. COMPARISON:  MRI brain 11/29/2016. CT head 11/24/2016. FINDINGS: Brain: Diffuse cerebral atrophy. Ventricular dilatation consistent with central atrophy. Low-attenuation changes throughout the deep white matter consistent with small vessel ischemia. No mass-effect or midline shift. No abnormal extra-axial fluid collections. Gray-white matter junctions are distinct. Basal cisterns are not effaced. No acute intracranial hemorrhage. Vascular: Moderate intracranial arterial vascular calcifications are present. Skull: Calvarium appears intact. Sinuses/Orbits: Paranasal sinuses and mastoid air cells are clear. Other: None. IMPRESSION: No acute intracranial abnormalities. Chronic atrophy and small vessel ischemic changes. Electronically Signed   By: Lucienne Capers M.D.   On: 11/27/2017 03:02   Ir Fluoro Guide Cv Line Right  Result Date: 11/03/2017 INDICATION: End-stage renal disease. In need intravenous access for the initiation of hemodialysis. EXAM: NON-TUNNELED CENTRAL VENOUS HEMODIALYSIS CATHETER PLACEMENT WITH ULTRASOUND AND FLUOROSCOPIC GUIDANCE COMPARISON:  Chest radiograph - 10/31/2017 MEDICATIONS: None FLUOROSCOPY TIME:  24 seconds (14 mGy) COMPLICATIONS: None immediate. PROCEDURE: Informed written consent was obtained from patient's family after a discussion of the risks, benefits, and alternatives to treatment. Questions regarding the procedure were encouraged and answered. The  right neck and chest were prepped with chlorhexidine in a sterile fashion, and a sterile drape was applied covering the operative field. Maximum barrier sterile technique with sterile gowns and gloves were used for the procedure. A timeout was performed prior to the initiation of the procedure. After the overlying soft tissues were anesthetized, a small venotomy incision was created and a micropuncture kit was utilized to access the internal jugular vein. Real-time ultrasound guidance was utilized for vascular access including the acquisition of a permanent ultrasound image documenting patency of the accessed vessel. The microwire was utilized to measure appropriate catheter length. A stiff glidewire was advanced to the level of the IVC. Under fluoroscopic guidance, the venotomy was serially dilated, ultimately allowing placement of a 20 cm temporary Trialysis catheter with tip ultimately terminating within the superior aspect of the right atrium. Final catheter positioning was confirmed and documented with a spot radiographic image. The catheter aspirates and flushes normally. The catheter was flushed with appropriate volume heparin dwells. The catheter exit site was secured with a 0-Prolene retention suture. A dressing was placed. The patient tolerated the procedure well without immediate post procedural complication. IMPRESSION: Successful placement of a right internal jugular approach 20 cm temporary dialysis catheter with tip terminating with in the superior aspect of the right atrium. The catheter is ready for immediate use. PLAN: This catheter  may be converted to a tunneled dialysis catheter at a later date as indicated. Electronically Signed   By: Sandi Mariscal M.D.   On: 11/03/2017 18:01   Ir US Guide Vasc Access Right  Result Date: 11/03/2017 INDICATION: End-stage renal disease. In need intravenous access for the initiation of hemodialysis. EXAM: NON-TUNNELED CENTRAL VENOUS HEMODIALYSIS CATHETER  PLACEMENT WITH ULTRASOUND AND FLUOROSCOPIC GUIDANCE COMPARISON:  Chest radiograph - 10/31/2017 MEDICATIONS: None FLUOROSCOPY TIME:  24 seconds (14 mGy) COMPLICATIONS: None immediate. PROCEDURE: Informed written consent was obtained from patient's family after a discussion of the risks, benefits, and alternatives to treatment. Questions regarding the procedure were encouraged and answered. The right neck and chest were prepped with chlorhexidine in a sterile fashion, and a sterile drape was applied covering the operative field. Maximum barrier sterile technique with sterile gowns and gloves were used for the procedure. A timeout was performed prior to the initiation of the procedure. After the overlying soft tissues were anesthetized, a small venotomy incision was created and a micropuncture kit was utilized to access the internal jugular vein. Real-time ultrasound guidance was utilized for vascular access including the acquisition of a permanent ultrasound image documenting patency of the accessed vessel. The microwire was utilized to measure appropriate catheter length. A stiff glidewire was advanced to the level of the IVC. Under fluoroscopic guidance, the venotomy was serially dilated, ultimately allowing placement of a 20 cm temporary Trialysis catheter with tip ultimately terminating within the superior aspect of the right atrium. Final catheter positioning was confirmed and documented with a spot radiographic image. The catheter aspirates and flushes normally. The catheter was flushed with appropriate volume heparin dwells. The catheter exit site was secured with a 0-Prolene retention suture. A dressing was placed. The patient tolerated the procedure well without immediate post procedural complication. IMPRESSION: Successful placement of a right internal jugular approach 20 cm temporary dialysis catheter with tip terminating with in the superior aspect of the right atrium. The catheter is ready for immediate  use. PLAN: This catheter may be converted to a tunneled dialysis catheter at a later date as indicated. Electronically Signed   By: Sandi Mariscal M.D.   On: 11/03/2017 18:01   Dg Chest Port 1 View  Result Date: 11/30/2017 CLINICAL DATA:  Shortness of breath, hypertension EXAM: PORTABLE CHEST 1 VIEW COMPARISON:  11/28/2017 FINDINGS: Cardiomegaly with vascular congestion and probable mild pulmonary edema. Findings are similar prior study. Suspect small layering effusions. No acute bony abnormality. IMPRESSION: Cardiomegaly with mild pulmonary edema and small layering effusions. No real change since prior study. Electronically Signed   By: Rolm Baptise M.D.   On: 11/30/2017 08:54   Dg Chest Port 1 View  Result Date: 11/28/2017 CLINICAL DATA:  Hypoxia. EXAM: PORTABLE CHEST 1 VIEW COMPARISON:  Radiograph November 26, 2017. FINDINGS: Stable cardiomegaly is noted with central pulmonary vascular congestion. No pneumothorax or pleural effusion is noted. Possible mild bilateral pulmonary edema may be present. Bony thorax is unremarkable. IMPRESSION: Stable cardiomegaly with central pulmonary vascular congestion and possible mild bilateral pulmonary edema. Electronically Signed   By: Marijo Conception, M.D.   On: 11/28/2017 12:55   Dg Chest Port 1 View  Result Date: 11/26/2017 CLINICAL DATA:  Altered mental status EXAM: PORTABLE CHEST 1 VIEW COMPARISON:  11/13/2017 FINDINGS: AP portable semi upright view of the chest. The patient is rotated to the right. Stable cardiomegaly with interstitial edema. No pulmonary confluence, effusion or pneumothorax. No acute osseous abnormality. IMPRESSION: Cardiomegaly with interstitial edema.  No significant change. Electronically Signed   By: Ashley Royalty M.D.   On: 11/26/2017 22:48   Dg Chest Port 1 View  Result Date: 11/13/2017 CLINICAL DATA:  Shortness of breath EXAM: PORTABLE CHEST 1 VIEW COMPARISON:  11/09/2017 FINDINGS: Cardiomegaly and slightly increased pulmonary  vascular congestion noted. Mild bibasilar opacities/atelectasis noted in this low volume film. A RIGHT central venous catheter with tips overlying the LOWER SVC/SUPERIOR cavoatrial junction noted. No pneumothorax. IMPRESSION: Cardiomegaly with pulmonary vascular congestion and mild bibasilar opacities/atelectasis. Electronically Signed   By: Margarette Canada M.D.   On: 11/13/2017 07:49   Dg Chest Port 1 View  Result Date: 11/09/2017 CLINICAL DATA:  Patient removed recent nasogastric catheter EXAM: PORTABLE CHEST 1 VIEW COMPARISON:  11/08/2017 FINDINGS: Cardiac shadow is enlarged. Right jugular central line is again seen and stable. Inspiratory effort is poor with crowding of the vascular markings. No focal confluent infiltrate is seen. No radiopaque foreign body is noted. The previously seen nasogastric catheter has been removed and as previously described was not a weighted tipped catheter. IMPRESSION: Poor inspiratory effort.  No acute abnormality noted. Electronically Signed   By: Inez Catalina M.D.   On: 11/09/2017 19:58   Dg Chest Port 1 View  Result Date: 11/08/2017 CLINICAL DATA:  Respiratory failure. EXAM: PORTABLE CHEST 1 VIEW COMPARISON:  11/07/2017 and older exams. FINDINGS: Mild enlargement of the cardiopericardial silhouette, stable. Prominent bronchovascular markings, with additional lung base opacity, the latter consistent with atelectasis, also without change from the previous day's exam. New nasal/orogastric tube passes below the included field of view, likely into the stomach. Right subclavian dual lumen central venous catheter is stable, tip in the right atrium. No pneumothorax. IMPRESSION: 1. No significant change in lung aeration from the previous day's study. There are prominent bronchovascular markings and lung base atelectasis, but no convincing pulmonary edema or pneumonia. 2. New nasal/orogastric tube. Tip not visualized, but likely extends into the stomach. Electronically Signed   By:  Lajean Manes M.D.   On: 11/08/2017 07:13   Dg Chest Port 1 View  Result Date: 11/07/2017 CLINICAL DATA:  Acute respiratory failure with hypoxia. EXAM: PORTABLE CHEST 1 VIEW COMPARISON:  10/31/2017. FINDINGS: Cardiomegaly. Improved lung volumes. Increased perihilar markings could represent early edema or viral pneumonitis. Other than improved lung markings, similar appearance to priors. Dialysis catheter has been inserted via RIGHT IJ approach. Tip slide in the RIGHT atrium. There is no pneumothorax. IMPRESSION: Satisfactory catheter placement. No significant change in the appearance of the lungs except for improved volume. Cardiomegaly. Electronically Signed   By: Staci Righter M.D.   On: 11/07/2017 11:30   Dg Chest Port 1 View  Result Date: 10/31/2017 CLINICAL DATA:  84 year old female with shortness of breath. EXAM: PORTABLE CHEST 1 VIEW COMPARISON:  Portable chest 10/30/2017 and earlier. CT Abdomen and Pelvis 10/30/2017. FINDINGS: There did not appear to be overt pulmonary edema at the lung bases yesterday by CT. Lower lobe bronchiectasis and peribronchial thickening was noted. Continued low lung volumes stable appearance of the pulmonary interstitium today from May this year. Stable mild cardiomegaly. Visualized tracheal air column is within normal limits. No pneumothorax, pleural effusion or confluent opacity. Large body habitus. No acute osseous abnormality identified. IMPRESSION: Persistent low lung volumes. No focal pulmonary abnormality. Consider viral or atypical respiratory infection given the appearance of the lung bases on CT Abdomen and Pelvis yesterday. Electronically Signed   By: Genevie Ann M.D.   On: 10/31/2017 13:18   Dg Abd Portable  1v  Result Date: 11/07/2017 CLINICAL DATA:  Nasogastric tube placement. EXAM: PORTABLE ABDOMEN - 1 VIEW COMPARISON:  11/03/2017 FINDINGS: Interval nasogastric tube folded back upon itself in the mid to distal stomach with its tip in the mid stomach. The  included bowel gas pattern is normal. Prominent interstitial markings at the left lung base. Lower thoracic spine degenerative changes. IMPRESSION: Nasogastric tube folded back upon itself in the mid to distal stomach with its tip in the mid stomach. Electronically Signed   By: Claudie Revering M.D.   On: 11/07/2017 13:13     Phillips Climes M.D on 11/30/2017 at 11:45 AM  Between 7am to 7pm - Pager - [phone]  After 7pm go to [email or web address] - password [email or web address]  [facility] -  Office  [phone]

## 2017-11-30 NOTE — Consult Note (Signed)
   [facility] CM Inpatient Consult   11/30/2017  Lauren Crosby 05-26-1931 [id number]    Patient screened for potential [facility] Care Management services due to unplanned readmission risk score of 38% (extreme) and multiple hospitalizations.  Chart reviewed. Noted palliative medicine team has been following hospital course. Also spoke with inpatient RNCM. Appears discharge plan may be for SNF.   No identifiable [facility] Care Management needs at this time.   Marthenia Rolling, MSN-Ed, RN,BSN [facility] Liaison [phone]

## 2017-11-30 NOTE — Progress Notes (Signed)
Attempted to sit patient on edge of bed. Patient began screaming and refused to move. Patient pulled up in bed ans sat in chair position.

## 2017-11-30 NOTE — Evaluation (Signed)
Clinical/Bedside Swallow Evaluation Patient Details  Name: OTHELL DILUZIO MRN: [id number] Date of Birth: [birth year]  Today's Date: 11/30/2017 Time: SLP Start Time (ACUTE ONLY): 1450 SLP Stop Time (ACUTE ONLY): 1512 SLP Time Calculation (min) (ACUTE ONLY): 22 min  Past Medical History:  Past Medical History:  Diagnosis Date  . Adrenal adenoma   . Anemia   . Arthritis    OA AND PAIN BOTH KNEES BUT RIGHT KNEE PAIN WORSE; SOME LOWER BACK PAIN  . Cellulitis and abscess of trunk   . Colon polyp    adenomatous  . Diabetes ([place])    type 2  PT IS ON ORAL MEDICATION AND INSULIN  . Diverticulitis   . GERD (gastroesophageal reflux disease)    NO MEDS FOR GERD  . Goiter   . Gout   . Hepatitis    YELLOW JAUNDICE WHEN A CHILD  . Hernia   . Hyperlipemia   . Hypertension   . Nephrolithiasis   . 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 Obesity   . Open wound of abdominal wall, anterior, without mention of complication   . Pneumonia    IN THE PAST  . Retinopathy due to secondary diabetes mellitus ([place])   . Shortness of breath    PT IS NOT ABLE TO BE ACTIVE BECAUSE OF KNEE PROBLEM; HAS ALWAYS HAD PROBLEM WITH BREATHING HEAVY - DOES GET SOB WITH EXERTION.  Marland Kitchen Thalassemia minor    Past Surgical History:  Past Surgical History:  Procedure Laterality Date  . APPENDECTOMY     1993 PT HAD APPENDECTOMY AND ABDOMINAL SURGERY FRO DIVERTICULITS  . CARDIAC CATHETERIZATION  2008   conclusion: smooth and normal coronary arteries, normal left ventricular systolic function  . CHOLECYSTECTOMY    . COLONOSCOPY  10/04/06  . CYSTOSCOPY WITH BIOPSY N/A 06/16/2016   Procedure: CYSTOSCOPY WITH  BLADDER BIOPSY AND FULGERATION 1CM;  Surgeon: Festus Aloe, MD;  Location: [facility];  Service: Urology;  Laterality: N/A;  . DIAGNOSTIC MAMMOGRAM  03/17/2011  . IR FLUORO GUIDE CV LINE RIGHT  11/03/2017  . IR US GUIDE VASC ACCESS RIGHT  11/03/2017  . ROTATOR CUFF REPAIR Left   . SIGMOID RESECTION  / RECTOPEXY    . TONSILLECTOMY    . TOTAL ABDOMINAL HYSTERECTOMY    . TOTAL KNEE ARTHROPLASTY Right 08/11/2013   Procedure: RIGHT TOTAL KNEE ARTHROPLASTY;  Surgeon: Mauri Pole, MD;  Location: [facility];  Service: Orthopedics;  Laterality: Right;  . VENTRAL HERNIA REPAIR  2007   HPI:  84 y.o.femalewith medical history significant ofhypertension, hyperlipidemia, diabetes mellitus, GERD, gout, iron deficiency anemia, diverticulitis, kidney stone, thalassemia minor, with recent hospitalization from 10/22-11/8 due to septic shock which was possibly due to C. difficile colitis. requiredCRRT in ICU.  Function recovered with no further need of dialysis.  Briefly seen last admission by SLP to slowly advance diet from thin and puree to chewable textures due to confusion.    Assessment / Plan / Recommendation Clinical Impression  Pt initially demonstrates no signs of aspiration with single or consecutive sips of liquid. Swallow appears timely and strong. After completing 8 oz with rapid intake, belching and delayed coughing observed. Suspect possible esophageal dysphagia, potential for laryngopharyngeal reflux. Introduced basic esopahgeal precautions to family. Pt very eager to eat and drink. Will intiate diet and f/u for tolerance and further strategies/education as needed.  SLP Visit Diagnosis: Dysphagia, unspecified (R13.10)    Aspiration Risk  Moderate aspiration risk    Diet Recommendation  Dysphagia 1 (Puree);Thin liquid   Liquid Administration via: Cup;Straw Medication Administration: Whole meds with puree Supervision: Patient able to self feed;Full supervision/cueing for compensatory strategies Compensations: Small sips/bites;Slow rate Postural Changes: Seated upright at 90 degrees;Remain upright for at least 30 minutes after po intake    Other  Recommendations Recommended Consults: Consider esophageal assessment Oral Care Recommendations: Oral care BID   Follow up Recommendations Skilled  Nursing facility      Frequency and Duration min 2x/week  2 weeks       Prognosis Prognosis for Safe Diet Advancement: Good      Swallow Study   General HPI: 84 y.o.femalewith medical history significant ofhypertension, hyperlipidemia, diabetes mellitus, GERD, gout, iron deficiency anemia, diverticulitis, kidney stone, thalassemia minor, with recent hospitalization from 10/22-11/8 due to septic shock which was possibly due to C. difficile colitis. requiredCRRT in ICU.  Function recovered with no further need of dialysis.  Briefly seen last admission by SLP to slowly advance diet from thin and puree to chewable textures due to confusion.  Type of Study: Bedside Swallow Evaluation Previous Swallow Assessment: see impression Diet Prior to this Study: NPO Temperature Spikes Noted: No Respiratory Status: Nasal cannula History of Recent Intubation: No Behavior/Cognition: Alert;Cooperative Oral Cavity Assessment: Within Functional Limits Oral Care Completed by SLP: No Oral Cavity - Dentition: Edentulous Vision: Functional for self-feeding Self-Feeding Abilities: Able to feed self Patient Positioning: Postural control adequate for testing Baseline Vocal Quality: Normal Volitional Cough: Strong Volitional Swallow: Able to elicit    Oral/Motor/Sensory Function Overall Oral Motor/Sensory Function: Within functional limits   Ice Chips Ice chips: Not tested   Thin Liquid Thin Liquid: Impaired Presentation: Cup;Straw;Self Fed Pharyngeal  Phase Impairments: Cough - Delayed    Nectar Thick Nectar Thick Liquid: Not tested   Honey Thick Honey Thick Liquid: Not tested   Puree Puree: Within functional limits Presentation: Self Fed;Spoon   Solid     Solid: Impaired Presentation: Self Fed;Spoon Oral Phase Impairments: Impaired mastication     Herbie Baltimore, MA CCC-SLP  Acute Rehabilitation Services Pager [phone] Office [phone]  Lynann Beaver 11/30/2017,3:17  PM

## 2017-11-30 NOTE — Progress Notes (Signed)
Discussed with Dr. Waldron Labs that we believe the majority of the issues is toxic/metabolic.  He agrees and states patient is doing much better.  At this point he does not believe we need to follow patient any further.  Thus neurology will sign off.  It was discussed with Elgergawy if he needs to have [place] come back and see the patient to please call us.  Etta Quill PA-C [facility] [phone]  M-F  (9:00 am- 5:00 PM)  11/30/2017, 9:57 AM

## 2017-12-01 ENCOUNTER — Other Ambulatory Visit: Payer: Self-pay

## 2017-12-01 LAB — GLUCOSE, CAPILLARY
GLUCOSE-CAPILLARY: 203 mg/dL — AB (ref 70–99)
Glucose-Capillary: 206 mg/dL — ABNORMAL HIGH (ref 70–99)
Glucose-Capillary: 200 mg/dL — ABNORMAL HIGH (ref 70–99)
Glucose-Capillary: 222 mg/dL — ABNORMAL HIGH (ref 70–99)
Glucose-Capillary: 171 mg/dL — ABNORMAL HIGH (ref 70–99)

## 2017-12-01 LAB — CBC
HEMATOCRIT: 26.1 % — AB (ref 36.0–46.0)
Hemoglobin: 7.7 g/dL — ABNORMAL LOW (ref 12.0–15.0)
MCH: 21.8 pg — ABNORMAL LOW (ref 26.0–34.0)
MCHC: 29.5 g/dL — AB (ref 30.0–36.0)
MCV: 73.9 fL — ABNORMAL LOW (ref 80.0–100.0)
Platelets: 109 10*3/uL — ABNORMAL LOW (ref 150–400)
RBC: 3.53 MIL/uL — ABNORMAL LOW (ref 3.87–5.11)
RDW: 20.2 % — AB (ref 11.5–15.5)
WBC: 3.5 10*3/uL — AB (ref 4.0–10.5)
nRBC: 0 % (ref 0.0–0.2)

## 2017-12-01 LAB — CULTURE, BLOOD (ROUTINE X 2)
CULTURE: NO GROWTH
Special Requests: ADEQUATE

## 2017-12-01 LAB — BASIC METABOLIC PANEL
Anion gap: 9 (ref 5–15)
BUN: 28 mg/dL — AB (ref 8–23)
CALCIUM: 8.1 mg/dL — AB (ref 8.9–10.3)
CO2: 29 mmol/L (ref 22–32)
Chloride: 106 mmol/L (ref 98–111)
Creatinine, Ser: 1.39 mg/dL — ABNORMAL HIGH (ref 0.44–1.00)
GFR calc Af Amer: 39 mL/min — ABNORMAL LOW (ref >60)
GFR calc non Af Amer: 34 mL/min — ABNORMAL LOW (ref >60)
GLUCOSE: 225 mg/dL — AB (ref 70–99)
Potassium: 3.2 mmol/L — ABNORMAL LOW (ref 3.5–5.1)
Sodium: 144 mmol/L (ref 135–145)

## 2017-12-01 MED ORDER — POTASSIUM CHLORIDE 10 MEQ/100ML IV SOLN: 10.0000 meq | INTRAVENOUS | Status: DC

## 2017-12-01 MED ORDER — POTASSIUM CHLORIDE 10 MEQ/100ML IV SOLN
10.0000 meq | INTRAVENOUS | Status: AC
  Administered 2017-12-01 (×3): 10 meq via INTRAVENOUS
  Filled 2017-12-01 (×3): qty 100

## 2017-12-01 MED ORDER — POTASSIUM CHLORIDE 10 MEQ/100ML IV SOLN: 10.0000 meq | INTRAVENOUS | Status: AC

## 2017-12-01 MED ORDER — POTASSIUM CHLORIDE 10 MEQ/100ML IV SOLN
INTRAVENOUS | Status: AC
  Administered 2017-12-01: 10 meq
  Filled 2017-12-01: qty 100

## 2017-12-01 MED ORDER — MAGNESIUM SULFATE IN D5W 1-5 GM/100ML-% IV SOLN
1.0000 g | Freq: Once | INTRAVENOUS | Status: AC
  Administered 2017-12-01: 1 g via INTRAVENOUS
  Filled 2017-12-01: qty 100

## 2017-12-01 MED ORDER — SODIUM CHLORIDE 0.9 % IV SOLN
510.0000 mg | Freq: Once | INTRAVENOUS | Status: AC
  Administered 2017-12-01: 510 mg via INTRAVENOUS
  Filled 2017-12-01: qty 17

## 2017-12-01 MED ORDER — SODIUM CHLORIDE 0.9 % IV SOLN
1.0000 g | INTRAVENOUS | Status: AC
  Administered 2017-12-01 – 2017-12-02 (×2): 1 g via INTRAVENOUS
  Filled 2017-12-01 (×2): qty 10

## 2017-12-01 NOTE — Evaluation (Signed)
Physical Therapy Evaluation Patient Details Name: Lauren Crosby MRN: [id number] DOB: 08-27-1931 Today's Date: 12/01/2017   History of Present Illness  Patient is a 84 yo female who presents with AMS and fever; pt with seizure in hospital with decompensated respiratory failure requiring BiPAP. Recently admitted 10/22-11/8 due to septic shock. PMH includes DM, CKD, HTN, HLD, morbid obesity, gout.  Clinical Impression  Patient presents with generalized weakness, impaired cognition and impaired mobility s/p above. Pt moaning for most of session and seems confused. Oriented to self only. Follows simple 1 step commands with increased time and repetition. Per notes, pt from [facility]. Not sure what activity pt was doing there as pt not able to state. Today, pt tolerated bed mobility and standing transfers with Mod A of 2. Would do well with stedy for transfers from nursing staff. Rn aware. Fatigues easily. Emotionally labile throughout session- upset she cannot walk. Would benefit from return to SNF to maximize independence and mobility at discharge. Will follow.    Follow Up Recommendations SNF;Supervision/Assistance - 24 hour    Equipment Recommendations  None recommended by PT    Recommendations for Other Services       Precautions / Restrictions Precautions Precautions: Fall Restrictions Weight Bearing Restrictions: No      Mobility  Bed Mobility Overal bed mobility: Needs Assistance Bed Mobility: Supine to Sit;Sit to Supine     Supine to sit: Mod assist;HOB elevated Sit to supine: Mod assist;+2 for physical assistance;HOB elevated   General bed mobility comments: Assist to move BLEs, scoot bottom and elevate trunk to get to EOB. Assist to bring LEs to return to supine.  Transfers Overall transfer level: Needs assistance Equipment used: Rolling walker (2 wheeled) Transfers: Sit to/from Stand Sit to Stand: From elevated surface;+2 physical assistance;Mod assist          General transfer comment: Assist of 2 to power to standing with cues for anterior weight shift and use of momentum. Stood Gaffer. BLEs leaning on bed rails and flexed trunk.  Ambulation/Gait             General Gait Details: unable  Stairs            Wheelchair Mobility    Modified Rankin (Stroke Patients Only)       Balance Overall balance assessment: Needs assistance Sitting-balance support: Feet supported;No upper extremity supported Sitting balance-Leahy Scale: Good Sitting balance - Comments: Able to sit EOB without UE support and perform there ex without LOB.   Standing balance support: During functional activity;Bilateral upper extremity supported Standing balance-Leahy Scale: Poor Standing balance comment: Requires assist to maintain standing balance as well as UE support.                             Pertinent Vitals/Pain Pain Assessment: Faces Faces Pain Scale: No hurt    Home Living Family/patient expects to be discharged to:: Skilled nursing facility(From [facility])                      Prior Function           Comments: Pt not able to provide and info about PLOF/history. Reports she cannot walk with RW or stand. Last admission pt was Max A of 2 to stand using stedy.     Hand Dominance        Extremity/Trunk Assessment   Upper Extremity Assessment Upper Extremity Assessment: Defer to  OT evaluation    Lower Extremity Assessment Lower Extremity Assessment: Generalized weakness(Grossly ~2+-3/5 throughout BLEs)       Communication   Communication: (groaning and moaning)  Cognition Arousal/Alertness: Lethargic Behavior During Therapy: Flat affect Overall Cognitive Status: Impaired/Different from baseline Area of Impairment: Orientation;Following commands;Safety/judgement;Awareness                 Orientation Level: Disoriented to;Place;Time;Situation     Following Commands: Follows one step commands with  increased time;Follows one step commands inconsistently Safety/Judgement: Decreased awareness of safety;Decreased awareness of deficits Awareness: Intellectual Problem Solving: Slow processing;Decreased initiation;Requires verbal cues;Requires tactile cues General Comments: pt with limited verbalizations this session, moaning and groaning throughout. States she is at [employer]"       General Comments      Exercises     Assessment/Plan    PT Assessment Patient needs continued PT services  PT Problem List Decreased strength;Decreased activity tolerance;Decreased balance;Decreased mobility;Decreased knowledge of use of DME;Decreased safety awareness;Decreased cognition       PT Treatment Interventions DME instruction;Gait training;Functional mobility training;Therapeutic activities;Therapeutic exercise;Balance training;Neuromuscular re-education;Patient/family education;Wheelchair mobility training    PT Goals (Current goals can be found in the Care Plan section)  Acute Rehab PT Goals Patient Stated Goal: to walk PT Goal Formulation: With patient Time For Goal Achievement: 12/15/17 Potential to Achieve Goals: Fair    Frequency Min 2X/week   Barriers to discharge        Co-evaluation               AM-PAC PT "6 Clicks" Mobility  Outcome Measure Help needed turning from your back to your side while in a flat bed without using bedrails?: A Lot Help needed moving from lying on your back to sitting on the side of a flat bed without using bedrails?: A Lot Help needed moving to and from a bed to a chair (including a wheelchair)?: Total Help needed standing up from a chair using your arms (e.g., wheelchair or bedside chair)?: A Lot Help needed to walk in hospital room?: Total Help needed climbing 3-5 steps with a railing? : Total 6 Click Score: 9    End of Session Equipment Utilized During Treatment: Gait belt Activity Tolerance: Patient limited by fatigue Patient  left: in bed;with call bell/phone within reach;with bed alarm set Nurse Communication: Mobility status;Need for lift equipment(stedy) PT Visit Diagnosis: Unsteadiness on feet (R26.81);Other abnormalities of gait and mobility (R26.89);Muscle weakness (generalized) (M62.81)    Time: 0109-3235 PT Time Calculation (min) (ACUTE ONLY): 31 min   Charges:   PT Evaluation $PT Eval Moderate Complexity: 1 Mod PT Treatments $Therapeutic Activity: 8-22 mins        Wray Kearns, PT, DPT Acute Rehabilitation Services Pager [phone] Office [phone]      Marguarite Arbour A Sabra Heck 12/01/2017, 4:00 PM

## 2017-12-01 NOTE — Progress Notes (Addendum)
PROGRESS NOTE                                                                                                                                                                                                             Patient Demographics:    Zailee Vallely, is a 84 y.o. female, DOB - [birth year], OEC:[id number]  Admit date - 11/26/2017   Admitting Physician Ivor Costa, MD  Outpatient Primary MD for the patient is [name], MD  LOS - 4   Chief Complaint  Patient presents with  . Altered Mental Status       Brief Narrative     84 y.o. female with medical history significant of hypertension, hyperlipidemia, diabetes mellitus, GERD, gout, iron deficiency anemia, diverticulitis, kidney stone, thalassemia minor, with recent hospitalization from 10/22-11/8 due to septic shock which was possibly due to C. difficile colitis. Pt also had worsening renal function and required CRRT in ICU. Her kidney function has improved.  Her creatinine was 1.6 on 11/8. Pt was discharged on Lasix 40 mg daily to nursing home for rehab as stable condition. -Patient presents with confusion, fever, she did have episode of seizures as well, and decompensated respiratory failure where she was on BiPAP.   Subjective:   Patient in bed, confused but in no distress, denies any headache chest pain or abdominal pain.  She asks for some water.   Assessment  & Plan :    Sepsis - she presented with fever tachycardia, elevated lactic acid and acute metabolic/toxic encephalopathy.  CT abdomen and pelvis showed left ureter punctate stones with proximal hydronephrosis, hydroureter and gas in the bladder.  Although her cultures were negative but there was clinical signs and symptoms of infection, she was initially placed on broad antibiotics which included vancomycin cefepime and Flagyl.  Cultures thus far negative, will be transitioned down to IV Rocephin.  Clinically sepsis  pathophysiology has resolved.  Acute hypercapnic respiratory failure  - Patient with increased work of breathing, required BiPAP 11/27/2017, ABG showing PCO2 of 55, saturating well, will try off BiPAP.  Getting IV Lasix and monitor.  Seizures  - neurology on board, continue with Keppra  Acute metabolic/Toxic encephalopathy:  - Due to combination of sepsis, hypercapnia and possibly seizures, CT head negative, MRI nonacute, ammonia stable, ABG suggested hypercapnia as well  along with sepsis.  With supportive care encephalopathy improving.  No focal deficits.  Neuro on board.  Depression: - Hold all home oral medications until mental status improves  HTN (hypertension):  -IV hydralazine PRN  GERD  - IV pepcid  Iron deficiency anemia:  -Check anemia panel supplement IV iron while in the hospital thereafter oral iron supplementation.  CKD (chronic kidney disease), stage IV ([place]): recent cre 1.6 on 11/8-->1.43 today. BUN 27.   C. difficile diarrhea: no diarrhea currently - Resolved, will try to minimize antibiotic exposure.  Hypokalemia - replaced  DM2 with renal manifestations ([place]): Last A1c 6.7 on 05/24/17, well controled. Patient is taking Lantus and NovoLog at home -will decrease Lantus dose from 10 to 7 units daily, + SSI, monitor and adjust.    CBG (last 3)  Recent Labs    11/30/17 2256 12/01/17 0521 12/01/17 0816  GLUCAP 221* 206* 200*      Code Status : DNR  Family Communication  : previous MD Discussed with husband at bedside  Disposition Plan  : pending further work up  Consults  :  PCCM  Procedures  : neurology  DVT Prophylaxis  :  Heparin  Lab Results  Component Value Date   PLT 109 (L) 12/01/2017    Antibiotics  :    Anti-infectives (From admission, onward)   Start     Dose/Rate Route Frequency Ordered Stop   11/30/17 1700  cefTRIAXone (ROCEPHIN) 2 g in sodium chloride 0.9 % 100 mL IVPB  Status:  Discontinued     2 g 200 mL/hr over 30 Minutes  Intravenous Every 24 hours 11/30/17 1148 12/01/17 0941   11/29/17 2200  vancomycin (VANCOCIN) 1,500 mg in sodium chloride 0.9 % 500 mL IVPB  Status:  Discontinued     1,500 mg 250 mL/hr over 120 Minutes Intravenous Every 48 hours 11/28/17 1418 11/29/17 1242   11/28/17 1800  ceFEPIme (MAXIPIME) 1 g in sodium chloride 0.9 % 100 mL IVPB  Status:  Discontinued     1 g 200 mL/hr over 30 Minutes Intravenous Every 24 hours 11/28/17 1418 11/30/17 1148   11/28/17 0000  vancomycin (VANCOCIN) 1,250 mg in sodium chloride 0.9 % 250 mL IVPB  Status:  Discontinued     1,250 mg 166.7 mL/hr over 90 Minutes Intravenous Every 24 hours 11/27/17 0159 11/28/17 1418   11/27/17 1800  ceFEPIme (MAXIPIME) 2 g in sodium chloride 0.9 % 100 mL IVPB  Status:  Discontinued     2 g 200 mL/hr over 30 Minutes Intravenous Every 24 hours 11/27/17 1502 11/28/17 1418   11/27/17 0200  metroNIDAZOLE (FLAGYL) IVPB 500 mg  Status:  Discontinued     500 mg 100 mL/hr over 60 Minutes Intravenous Every 8 hours 11/27/17 0126 12/01/17 0941   11/27/17 0200  aztreonam (AZACTAM) 1 g in sodium chloride 0.9 % 100 mL IVPB  Status:  Discontinued     1 g 200 mL/hr over 30 Minutes Intravenous Every 8 hours 11/27/17 0159 11/27/17 1502   11/27/17 0200  vancomycin (VANCOCIN) 2,000 mg in sodium chloride 0.9 % 500 mL IVPB     2,000 mg 250 mL/hr over 120 Minutes Intravenous  Once 11/27/17 0159 11/27/17 0606   11/26/17 2200  cefTRIAXone (ROCEPHIN) 2 g in sodium chloride 0.9 % 100 mL IVPB  Status:  Discontinued     2 g 200 mL/hr over 30 Minutes Intravenous Every 24 hours 11/26/17 2140 11/27/17 0125        Objective:  Vitals:   12/01/17 0610 12/01/17 0720 12/01/17 0745 12/01/17 0819  BP:      Pulse:  83    Resp:  (!) 23    Temp:    99.3 F (37.4 C)  TempSrc:    Oral  SpO2:  96%    Weight: 103.8 kg  103.8 kg   Height:   5' 2"  (1.575 m)     Wt Readings from Last 3 Encounters:  12/01/17 103.8 kg  11/22/17 109.8 kg  11/19/17 109.8 kg      Intake/Output Summary (Last 24 hours) at 12/01/2017 0943 Last data filed at 12/01/2017 0538 Gross per 24 hour  Intake 1285.63 ml  Output 3350 ml  Net -2064.37 ml     Physical Exam  Awake but pleasantly confused, will answer short questions, unable to follow commands reliably, moving all 4 extremities, [place].AT,PERRAL Supple Neck,No JVD, No cervical lymphadenopathy appriciated.  Symmetrical Chest wall movement, Good air movement bilaterally, CTAB RRR,No Gallops, Rubs or new Murmurs, No Parasternal Heave +ve B.Sounds, Abd Soft, No tenderness, No organomegaly appriciated, No rebound - guarding or rigidity. No Cyanosis, Clubbing or edema, No new Rash or bruise    Data Review:    CBC Recent Labs  Lab 11/26/17 2152 11/27/17 0327 11/28/17 0327 11/29/17 0256 11/30/17 0401 12/01/17 0448  WBC 7.2 8.3 4.4 4.5 3.4* 3.5*  HGB 10.2* 9.5* 7.6* 8.0* 8.1* 7.7*  HCT 33.4* 33.0* 25.9* 27.0* 27.0* 26.1*  PLT 160 160 107* 104* 100* 109*  MCV 73.1* 74.0* 74.4* 74.8* 73.8* 73.9*  MCH 22.3* 21.3* 21.8* 22.2* 22.1* 21.8*  MCHC 30.5 28.8* 29.3* 29.6* 30.0 29.5*  RDW 20.0* 20.1* 20.6* 20.7* 20.2* 20.2*  LYMPHSABS 0.6*  --   --   --   --   --   MONOABS 0.2  --   --   --   --   --   EOSABS 0.0  --   --   --   --   --   BASOSABS 0.0  --   --   --   --   --     Chemistries  Recent Labs  Lab 11/26/17 2152 11/27/17 0327 11/28/17 0327 11/29/17 0256 11/30/17 0401 12/01/17 0448  NA 138 139 143 144 149* 144  K 4.2 4.0 3.0* 3.7 3.4* 3.2*  CL 96* 98 108 111 114* 106  CO2 30 26 25 26 27 29   GLUCOSE 300* 340* 205* 137* 121* 225*  BUN 27* 26* 35* 37* 32* 28*  CREATININE 1.43* 1.55* 1.73* 1.56* 1.53* 1.39*  CALCIUM 9.3 8.8* 7.6* 7.7* 8.3* 8.1*  AST 29  --   --   --   --   --   ALT 21  --   --   --   --   --   ALKPHOS 137*  --   --   --   --   --   BILITOT 1.0  --   --   --   --   --     ------------------------------------------------------------------------------------------------------------------ No results for input(s): CHOL, HDL, LDLCALC, TRIG, CHOLHDL, LDLDIRECT in the last 72 hours.  Lab Results  Component Value Date   HGBA1C 6.7 (H) 05/24/2017   ------------------------------------------------------------------------------------------------------------------ No results for input(s): TSH, T4TOTAL, T3FREE, THYROIDAB in the last 72 hours.  Invalid input(s): FREET3 ------------------------------------------------------------------------------------------------------------------ Recent Labs    11/28/17 1202  VITAMINB12 523  FOLATE 34.8  FERRITIN 895*  TIBC 213*  IRON 79  RETICCTPCT 1.8    Coagulation  profile Recent Labs  Lab 11/27/17 0327  INR 1.20    No results for input(s): DDIMER in the last 72 hours.  Cardiac Enzymes No results for input(s): CKMB, TROPONINI, MYOGLOBIN in the last 168 hours.  Invalid input(s): CK ------------------------------------------------------------------------------------------------------------------    Component Value Date/Time   BNP 859.9 (H) 11/27/2017 0327   BNP 18.5 08/14/2012 0930    Inpatient Medications  Scheduled Meds: . chlorhexidine  15 mL Mouth Rinse BID  . furosemide  60 mg Intravenous Q12H  . heparin  5,000 Units Subcutaneous Q8H  . insulin aspart  0-5 Units Subcutaneous QHS  . insulin aspart  0-9 Units Subcutaneous TID WC  . insulin glargine  7 Units Subcutaneous QHS  . ipratropium  0.5 mg Nebulization BID  . levalbuterol  1.25 mg Nebulization BID  . mouth rinse  15 mL Mouth Rinse q12n4p   Continuous Infusions: . sodium chloride Stopped (11/28/17 1618)  . famotidine (PEPCID) IV 20 mg (12/01/17 0836)  . levETIRAcetam 1,000 mg (12/01/17 0100)  . magnesium sulfate 1 - 4 g bolus IVPB    . potassium chloride     PRN Meds:.sodium chloride, acetaminophen **OR** acetaminophen, hydrALAZINE,  [DISCONTINUED] ondansetron **OR** ondansetron (ZOFRAN) IV  Micro Results Recent Results (from the past 240 hour(s))  Blood Culture (routine x 2)     Status: None (Preliminary result)   Collection Time: 11/26/17  9:52 PM  Result Value Ref Range Status   Specimen Description BLOOD RIGHT HAND  Final   Special Requests   Final    BOTTLES DRAWN AEROBIC AND ANAEROBIC Blood Culture adequate volume   Culture   Final    NO GROWTH 4 DAYS Performed at [facility], [place] [address] [place], [place] [address]    Report Status PENDING  Incomplete  Urine culture     Status: None   Collection Time: 11/26/17 10:37 PM  Result Value Ref Range Status   Specimen Description URINE, CATHETERIZED  Final   Special Requests NONE  Final   Culture   Final    NO GROWTH Performed at [facility], [address], [place], [place] [address]    Report Status 11/28/2017 FINAL  Final  Blood Culture (routine x 2)     Status: None (Preliminary result)   Collection Time: 11/26/17 11:39 PM  Result Value Ref Range Status   Specimen Description BLOOD LEFT FOREARM  Final   Special Requests   Final    BOTTLES DRAWN AEROBIC AND ANAEROBIC Blood Culture adequate volume   Culture   Final    NO GROWTH 3 DAYS Performed at [facility], [place] [address] [place], [place] [address]    Report Status PENDING  Incomplete  Respiratory Panel by PCR     Status: None   Collection Time: 11/27/17  1:38 PM  Result Value Ref Range Status   Adenovirus NOT DETECTED NOT DETECTED Final   Coronavirus 229E NOT DETECTED NOT DETECTED Final   Coronavirus HKU1 NOT DETECTED NOT DETECTED Final   Coronavirus NL63 NOT DETECTED NOT DETECTED Final   Coronavirus OC43 NOT DETECTED NOT DETECTED Final   Metapneumovirus NOT DETECTED NOT DETECTED Final   Rhinovirus / Enterovirus NOT DETECTED NOT DETECTED Final   Influenza A NOT DETECTED NOT DETECTED Final   Influenza B NOT DETECTED NOT DETECTED Final   Parainfluenza Virus 1 NOT  DETECTED NOT DETECTED Final   Parainfluenza Virus 2 NOT DETECTED NOT DETECTED Final   Parainfluenza Virus 3 NOT DETECTED  NOT DETECTED Final   Parainfluenza Virus 4 NOT DETECTED NOT DETECTED Final   Respiratory Syncytial Virus NOT DETECTED NOT DETECTED Final   Bordetella pertussis NOT DETECTED NOT DETECTED Final   Chlamydophila pneumoniae NOT DETECTED NOT DETECTED Final   Mycoplasma pneumoniae NOT DETECTED NOT DETECTED Final    Comment: Performed at [facility], [place] [address], [place], [place] [address]    Radiology Reports Ct Abdomen Pelvis Wo Contrast  Result Date: 11/27/2017 CLINICAL DATA:  Abdominal distention, altered mental status, sepsis. History of Clostridium difficile colitis, open wound of the abdominal wall, nephrolithiasis, hernia, gastroesophageal reflux disease, diverticulitis, colon polyp, cellulitis and abscess, adrenal adenoma, ventral hernia repair, hysterectomy, sigmoid resection, cholecystectomy, appendectomy. EXAM: CT ABDOMEN AND PELVIS WITHOUT CONTRAST TECHNIQUE: Multidetector CT imaging of the abdomen and pelvis was performed following the standard protocol without IV contrast. COMPARISON:  10/30/2017 FINDINGS: Lower chest: Evaluation is limited due to motion artifact. Atelectasis or consolidation in the lung bases, greater on the left. Coronary artery calcifications. Calcification in the mitral valve annulus. Hepatobiliary: No focal liver abnormality is seen. Status post cholecystectomy. No biliary dilatation. Pancreas: Fatty infiltration of the pancreas. Spleen: Normal in size without focal abnormality. Adrenals/Urinary Tract: 3.1 cm diameter left adrenal gland nodule. No change since previous study. Fat attenuation consistent with benign adenoma. Bilateral renal parenchymal atrophy. Mild left hydronephrosis and hydroureter with tiny punctate stones suggested in the distal left ureter. Bladder wall is not thickened. Gas in the bladder may come from instrumentation or  infection. Exophytic lesion arising from the right kidney without change since prior study. Hyperdensity likely indicating a hemorrhagic cyst. Stomach/Bowel: Stomach, small bowel, and colon are not abnormally distended. Scattered stool throughout the colon. Scattered colonic diverticula. No evidence of diverticulitis. Prominent stool in the rectum with mild rectal wall thickening may indicate stercoral colitis. Infiltration in the presacral fat is unchanged since prior study, possibly inflammatory or reactive. Vascular/Lymphatic: Aortic atherosclerosis. No enlarged abdominal or pelvic lymph nodes. Reproductive: Status post hysterectomy. No adnexal masses. Other: Small amount of free fluid around the liver and spleen. Likely ascites. Scarring in the anterior abdominal wall consistent with postoperative change. Edema in the subcutaneous fat. No free air in the abdomen. Musculoskeletal: Degenerative changes in the spine. No destructive bone lesions. IMPRESSION: 1. Atelectasis or consolidation in the lung bases, greater on the left. 2. Left adrenal gland adenoma. 3. Bilateral renal parenchymal atrophy. Punctate stones suggested in the distal left ureter. Mild proximal hydronephrosis and hydroureter. Gas in the bladder may be due to instrumentation or infection. 4. Prominent stool in the rectum with rectal wall thickening suggesting possible stercoral colitis. 5. Small amount of free fluid in the abdomen and pelvis, likely ascites. 6. Probable hemorrhagic cysts in the right kidney. Aortic Atherosclerosis (ICD10-I70.0). Electronically Signed   By: Lucienne Capers M.D.   On: 11/27/2017 03:10   Dg Abd 1 View  Result Date: 11/09/2017 CLINICAL DATA:  Patient pulled out nasogastric catheter EXAM: ABDOMEN - 1 VIEW COMPARISON:  11/07/2017 FINDINGS: Scattered large and small bowel gas is noted. The previously seen nasogastric catheter has been removed in the interval. Despite the given clinical history of metal tipped  catheter this catheter on previous images represented a standard nasogastric catheter without metallic weighting. No foreign body is noted. IMPRESSION: No evidence of retained foreign body as the prior nasogastric catheter was not a weighted tipped catheter based on previous images. Electronically Signed   By: Inez Catalina M.D.   On: 11/09/2017 19:57  Dg Abd 1 View  Result Date: 11/07/2017 CLINICAL DATA:  84 year old female with feeding tube placement. EXAM: ABDOMEN - 1 VIEW COMPARISON:  Earlier abdominal radiograph dated 11/07/2017 FINDINGS: No significant change in the appearance or positioning of the enteric tube which appears somewhat folded, likely in the mid to distal stomach. IMPRESSION: No significant interval change. Electronically Signed   By: Anner Crete M.D.   On: 11/07/2017 23:00   Dg Abd 1 View  Result Date: 11/03/2017 CLINICAL DATA:  Abdominal distension. EXAM: ABDOMEN - 1 VIEW COMPARISON:  CT, 10/30/2017 FINDINGS: Normal bowel gas pattern with no evidence of obstruction. Soft tissues are not well-defined due to body habitus. No convincing renal or ureteral stones. Status post cholecystectomy. No acute skeletal abnormality. IMPRESSION: 1. No acute findings.  No evidence of bowel obstruction. Electronically Signed   By: Lajean Manes M.D.   On: 11/03/2017 11:17   Ct Head Wo Contrast  Result Date: 11/27/2017 CLINICAL DATA:  Altered mental status. History of hypertension, hepatitis, diabetes, edema. EXAM: CT HEAD WITHOUT CONTRAST TECHNIQUE: Contiguous axial images were obtained from the base of the skull through the vertex without intravenous contrast. COMPARISON:  MRI brain 11/29/2016. CT head 11/24/2016. FINDINGS: Brain: Diffuse cerebral atrophy. Ventricular dilatation consistent with central atrophy. Low-attenuation changes throughout the deep white matter consistent with small vessel ischemia. No mass-effect or midline shift. No abnormal extra-axial fluid collections. Gray-white  matter junctions are distinct. Basal cisterns are not effaced. No acute intracranial hemorrhage. Vascular: Moderate intracranial arterial vascular calcifications are present. Skull: Calvarium appears intact. Sinuses/Orbits: Paranasal sinuses and mastoid air cells are clear. Other: None. IMPRESSION: No acute intracranial abnormalities. Chronic atrophy and small vessel ischemic changes. Electronically Signed   By: Lucienne Capers M.D.   On: 11/27/2017 03:02   Mr Brain Wo Contrast  Result Date: 11/30/2017 CLINICAL DATA:  Initial evaluation for acute altered mental status, seizure, unclear cause. EXAM: MRI HEAD WITHOUT CONTRAST TECHNIQUE: Multiplanar, multiecho pulse sequences of the brain and surrounding structures were obtained without intravenous contrast. COMPARISON:  Prior CT from 11/27/2017. FINDINGS: Brain: Diffuse prominence of the CSF containing spaces compatible with generalized age-related cerebral atrophy, relatively stable in appearance as compared to most recent MRI from 11/29/2016. There has been interval development of extensive T2/FLAIR signal abnormality throughout the supratentorial and infratentorial cerebral white matter, involving both cerebral hemispheres in a fairly symmetric pattern. Slight posterior predominance involving the parieto-occipital regions noted. Involvement of both internal and external capsules. Patchy infratentorial involvement involving the bilateral cerebellar hemispheres. Suggestion of associated mild edema as a few cortical gyri appear slightly expanded as compared to previous exam (series 10, image 20). Sparing of the overlying cortical gray matter which is relatively normal in appearance. Findings are nonspecific. Superimposed chronic microvascular ischemic changes noted within the pons, otherwise similar to previous. No abnormal foci of restricted diffusion to suggest acute or subacute ischemia. No encephalomalacia to suggest chronic cortical infarction. No foci of  susceptibility artifact to suggest acute or chronic intracranial hemorrhage. No mass lesion, midline shift or mass effect. Stable ventricular prominence related global parenchymal volume loss without hydrocephalus. No extra-axial fluid collection. Pituitary gland normal. Midline structures intact. Vascular: Major intracranial vascular flow voids are maintained. Skull and upper cervical spine: Craniocervical junction within normal limits. No focal marrow replacing lesion. Scalp soft tissues unremarkable. Sinuses/Orbits: Patient status post ocular lens replacement bilaterally. Paranasal sinuses are clear. Small right mastoid effusion noted. Other: None. IMPRESSION: Extensive abnormal T2/FLAIR signal abnormality throughout the cerebral white matter as above,  new relative to most recent MRI from 11/29/2016. Findings are nonspecific, with primary differential considerations including PRES versus toxic/metabolic derangement. Anoxic brain injury felt to be unlikely given the relative sparing of the gray matter structures. Correlation with laboratory values and history recommended. Electronically Signed   By: Jeannine Boga M.D.   On: 11/30/2017 14:18   Ir Fluoro Guide Cv Line Right  Result Date: 11/03/2017 INDICATION: End-stage renal disease. In need intravenous access for the initiation of hemodialysis. EXAM: NON-TUNNELED CENTRAL VENOUS HEMODIALYSIS CATHETER PLACEMENT WITH ULTRASOUND AND FLUOROSCOPIC GUIDANCE COMPARISON:  Chest radiograph - 10/31/2017 MEDICATIONS: None FLUOROSCOPY TIME:  24 seconds (14 mGy) COMPLICATIONS: None immediate. PROCEDURE: Informed written consent was obtained from patient's family after a discussion of the risks, benefits, and alternatives to treatment. Questions regarding the procedure were encouraged and answered. The right neck and chest were prepped with chlorhexidine in a sterile fashion, and a sterile drape was applied covering the operative field. Maximum barrier sterile  technique with sterile gowns and gloves were used for the procedure. A timeout was performed prior to the initiation of the procedure. After the overlying soft tissues were anesthetized, a small venotomy incision was created and a micropuncture kit was utilized to access the internal jugular vein. Real-time ultrasound guidance was utilized for vascular access including the acquisition of a permanent ultrasound image documenting patency of the accessed vessel. The microwire was utilized to measure appropriate catheter length. A stiff glidewire was advanced to the level of the IVC. Under fluoroscopic guidance, the venotomy was serially dilated, ultimately allowing placement of a 20 cm temporary Trialysis catheter with tip ultimately terminating within the superior aspect of the right atrium. Final catheter positioning was confirmed and documented with a spot radiographic image. The catheter aspirates and flushes normally. The catheter was flushed with appropriate volume heparin dwells. The catheter exit site was secured with a 0-Prolene retention suture. A dressing was placed. The patient tolerated the procedure well without immediate post procedural complication. IMPRESSION: Successful placement of a right internal jugular approach 20 cm temporary dialysis catheter with tip terminating with in the superior aspect of the right atrium. The catheter is ready for immediate use. PLAN: This catheter may be converted to a tunneled dialysis catheter at a later date as indicated. Electronically Signed   By: Sandi Mariscal M.D.   On: 11/03/2017 18:01   Ir US Guide Vasc Access Right  Result Date: 11/03/2017 INDICATION: End-stage renal disease. In need intravenous access for the initiation of hemodialysis. EXAM: NON-TUNNELED CENTRAL VENOUS HEMODIALYSIS CATHETER PLACEMENT WITH ULTRASOUND AND FLUOROSCOPIC GUIDANCE COMPARISON:  Chest radiograph - 10/31/2017 MEDICATIONS: None FLUOROSCOPY TIME:  24 seconds (14 mGy) COMPLICATIONS:  None immediate. PROCEDURE: Informed written consent was obtained from patient's family after a discussion of the risks, benefits, and alternatives to treatment. Questions regarding the procedure were encouraged and answered. The right neck and chest were prepped with chlorhexidine in a sterile fashion, and a sterile drape was applied covering the operative field. Maximum barrier sterile technique with sterile gowns and gloves were used for the procedure. A timeout was performed prior to the initiation of the procedure. After the overlying soft tissues were anesthetized, a small venotomy incision was created and a micropuncture kit was utilized to access the internal jugular vein. Real-time ultrasound guidance was utilized for vascular access including the acquisition of a permanent ultrasound image documenting patency of the accessed vessel. The microwire was utilized to measure appropriate catheter length. A stiff glidewire was advanced to the level of  the IVC. Under fluoroscopic guidance, the venotomy was serially dilated, ultimately allowing placement of a 20 cm temporary Trialysis catheter with tip ultimately terminating within the superior aspect of the right atrium. Final catheter positioning was confirmed and documented with a spot radiographic image. The catheter aspirates and flushes normally. The catheter was flushed with appropriate volume heparin dwells. The catheter exit site was secured with a 0-Prolene retention suture. A dressing was placed. The patient tolerated the procedure well without immediate post procedural complication. IMPRESSION: Successful placement of a right internal jugular approach 20 cm temporary dialysis catheter with tip terminating with in the superior aspect of the right atrium. The catheter is ready for immediate use. PLAN: This catheter may be converted to a tunneled dialysis catheter at a later date as indicated. Electronically Signed   By: Sandi Mariscal M.D.   On: 11/03/2017  18:01   Dg Chest Port 1 View  Result Date: 11/30/2017 CLINICAL DATA:  Shortness of breath, hypertension EXAM: PORTABLE CHEST 1 VIEW COMPARISON:  11/28/2017 FINDINGS: Cardiomegaly with vascular congestion and probable mild pulmonary edema. Findings are similar prior study. Suspect small layering effusions. No acute bony abnormality. IMPRESSION: Cardiomegaly with mild pulmonary edema and small layering effusions. No real change since prior study. Electronically Signed   By: Rolm Baptise M.D.   On: 11/30/2017 08:54   Dg Chest Port 1 View  Result Date: 11/28/2017 CLINICAL DATA:  Hypoxia. EXAM: PORTABLE CHEST 1 VIEW COMPARISON:  Radiograph November 26, 2017. FINDINGS: Stable cardiomegaly is noted with central pulmonary vascular congestion. No pneumothorax or pleural effusion is noted. Possible mild bilateral pulmonary edema may be present. Bony thorax is unremarkable. IMPRESSION: Stable cardiomegaly with central pulmonary vascular congestion and possible mild bilateral pulmonary edema. Electronically Signed   By: Marijo Conception, M.D.   On: 11/28/2017 12:55   Dg Chest Port 1 View  Result Date: 11/26/2017 CLINICAL DATA:  Altered mental status EXAM: PORTABLE CHEST 1 VIEW COMPARISON:  11/13/2017 FINDINGS: AP portable semi upright view of the chest. The patient is rotated to the right. Stable cardiomegaly with interstitial edema. No pulmonary confluence, effusion or pneumothorax. No acute osseous abnormality. IMPRESSION: Cardiomegaly with interstitial edema.  No significant change. Electronically Signed   By: Ashley Royalty M.D.   On: 11/26/2017 22:48   Dg Chest Port 1 View  Result Date: 11/13/2017 CLINICAL DATA:  Shortness of breath EXAM: PORTABLE CHEST 1 VIEW COMPARISON:  11/09/2017 FINDINGS: Cardiomegaly and slightly increased pulmonary vascular congestion noted. Mild bibasilar opacities/atelectasis noted in this low volume film. A RIGHT central venous catheter with tips overlying the LOWER SVC/SUPERIOR  cavoatrial junction noted. No pneumothorax. IMPRESSION: Cardiomegaly with pulmonary vascular congestion and mild bibasilar opacities/atelectasis. Electronically Signed   By: Margarette Canada M.D.   On: 11/13/2017 07:49   Dg Chest Port 1 View  Result Date: 11/09/2017 CLINICAL DATA:  Patient removed recent nasogastric catheter EXAM: PORTABLE CHEST 1 VIEW COMPARISON:  11/08/2017 FINDINGS: Cardiac shadow is enlarged. Right jugular central line is again seen and stable. Inspiratory effort is poor with crowding of the vascular markings. No focal confluent infiltrate is seen. No radiopaque foreign body is noted. The previously seen nasogastric catheter has been removed and as previously described was not a weighted tipped catheter. IMPRESSION: Poor inspiratory effort.  No acute abnormality noted. Electronically Signed   By: Inez Catalina M.D.   On: 11/09/2017 19:58   Dg Chest Port 1 View  Result Date: 11/08/2017 CLINICAL DATA:  Respiratory failure. EXAM: PORTABLE CHEST 1  VIEW COMPARISON:  11/07/2017 and older exams. FINDINGS: Mild enlargement of the cardiopericardial silhouette, stable. Prominent bronchovascular markings, with additional lung base opacity, the latter consistent with atelectasis, also without change from the previous day's exam. New nasal/orogastric tube passes below the included field of view, likely into the stomach. Right subclavian dual lumen central venous catheter is stable, tip in the right atrium. No pneumothorax. IMPRESSION: 1. No significant change in lung aeration from the previous day's study. There are prominent bronchovascular markings and lung base atelectasis, but no convincing pulmonary edema or pneumonia. 2. New nasal/orogastric tube. Tip not visualized, but likely extends into the stomach. Electronically Signed   By: Lajean Manes M.D.   On: 11/08/2017 07:13   Dg Chest Port 1 View  Result Date: 11/07/2017 CLINICAL DATA:  Acute respiratory failure with hypoxia. EXAM: PORTABLE CHEST  1 VIEW COMPARISON:  10/31/2017. FINDINGS: Cardiomegaly. Improved lung volumes. Increased perihilar markings could represent early edema or viral pneumonitis. Other than improved lung markings, similar appearance to priors. Dialysis catheter has been inserted via RIGHT IJ approach. Tip slide in the RIGHT atrium. There is no pneumothorax. IMPRESSION: Satisfactory catheter placement. No significant change in the appearance of the lungs except for improved volume. Cardiomegaly. Electronically Signed   By: Staci Righter M.D.   On: 11/07/2017 11:30   Dg Abd Portable 1v  Result Date: 11/07/2017 CLINICAL DATA:  Nasogastric tube placement. EXAM: PORTABLE ABDOMEN - 1 VIEW COMPARISON:  11/03/2017 FINDINGS: Interval nasogastric tube folded back upon itself in the mid to distal stomach with its tip in the mid stomach. The included bowel gas pattern is normal. Prominent interstitial markings at the left lung base. Lower thoracic spine degenerative changes. IMPRESSION: Nasogastric tube folded back upon itself in the mid to distal stomach with its tip in the mid stomach. Electronically Signed   By: Claudie Revering M.D.   On: 11/07/2017 13:13     Signature  Lala Lund M.D on 12/01/2017 at 9:48 AM   -  To page go to [email or web address] - password [facility]

## 2017-12-01 NOTE — Progress Notes (Signed)
Aspiration Precautions  Order Aspiration precautions in EMR Recommend SLP consult  Elevate HOB 30 degrees or higher Sit upright 90 degrees for meals/snacks  Maintain suction set up in room Perform oral care 4 times a day Inspect oral cavity for retained food or medications  Supervise or assist with meals as necessary based on assessment        Independent        Set up assist        Full supervision (feeder) 

## 2017-12-02 ENCOUNTER — Inpatient Hospital Stay (HOSPITAL_COMMUNITY): Payer: Medicare HMO

## 2017-12-02 DIAGNOSIS — I5033 Acute on chronic diastolic (congestive) heart failure: Secondary | ICD-10-CM

## 2017-12-02 LAB — CBC
HCT: 26.9 % — ABNORMAL LOW (ref 36.0–46.0)
HEMOGLOBIN: 7.9 g/dL — AB (ref 12.0–15.0)
MCH: 21.4 pg — ABNORMAL LOW (ref 26.0–34.0)
MCHC: 29.4 g/dL — AB (ref 30.0–36.0)
MCV: 72.7 fL — ABNORMAL LOW (ref 80.0–100.0)
NRBC: 0 % (ref 0.0–0.2)
Platelets: 90 10*3/uL — ABNORMAL LOW (ref 150–400)
RBC: 3.7 MIL/uL — ABNORMAL LOW (ref 3.87–5.11)
RDW: 19.9 % — ABNORMAL HIGH (ref 11.5–15.5)
WBC: 3.9 10*3/uL — AB (ref 4.0–10.5)

## 2017-12-02 LAB — BASIC METABOLIC PANEL
ANION GAP: 9 (ref 5–15)
BUN: 23 mg/dL (ref 8–23)
CHLORIDE: 105 mmol/L (ref 98–111)
CO2: 27 mmol/L (ref 22–32)
Calcium: 8.4 mg/dL — ABNORMAL LOW (ref 8.9–10.3)
Creatinine, Ser: 1.22 mg/dL — ABNORMAL HIGH (ref 0.44–1.00)
GFR calc non Af Amer: 40 mL/min — ABNORMAL LOW (ref >60)
GFR, EST AFRICAN AMERICAN: 46 mL/min — AB (ref >60)
Glucose, Bld: 204 mg/dL — ABNORMAL HIGH (ref 70–99)
Potassium: 3.2 mmol/L — ABNORMAL LOW (ref 3.5–5.1)
Sodium: 141 mmol/L (ref 135–145)

## 2017-12-02 LAB — CULTURE, BLOOD (ROUTINE X 2)
Culture: NO GROWTH
SPECIAL REQUESTS: ADEQUATE

## 2017-12-02 LAB — GLUCOSE, CAPILLARY
GLUCOSE-CAPILLARY: 170 mg/dL — AB (ref 70–99)
GLUCOSE-CAPILLARY: 236 mg/dL — AB (ref 70–99)
GLUCOSE-CAPILLARY: 229 mg/dL — AB (ref 70–99)
Glucose-Capillary: 191 mg/dL — ABNORMAL HIGH (ref 70–99)
Glucose-Capillary: 262 mg/dL — ABNORMAL HIGH (ref 70–99)

## 2017-12-02 LAB — MAGNESIUM: MAGNESIUM: 1.4 mg/dL — AB (ref 1.7–2.4)

## 2017-12-02 MED ORDER — POTASSIUM CHLORIDE CRYS ER 20 MEQ PO TBCR
40.0000 meq | EXTENDED_RELEASE_TABLET | Freq: Once | ORAL | Status: AC
  Administered 2017-12-02: 40 meq via ORAL
  Filled 2017-12-02: qty 2

## 2017-12-02 MED ORDER — MAGNESIUM SULFATE 2 GM/50ML IV SOLN
2.0000 g | Freq: Once | INTRAVENOUS | Status: AC
  Administered 2017-12-02: 2 g via INTRAVENOUS
  Filled 2017-12-02: qty 50

## 2017-12-02 MED ORDER — POTASSIUM CHLORIDE CRYS ER 20 MEQ PO TBCR
40.0000 meq | EXTENDED_RELEASE_TABLET | Freq: Every day | ORAL | Status: DC
  Administered 2017-12-02 – 2017-12-03 (×2): 40 meq via ORAL
  Filled 2017-12-02: qty 2

## 2017-12-02 MED ORDER — POTASSIUM CHLORIDE CRYS ER 20 MEQ PO TBCR
20.0000 meq | EXTENDED_RELEASE_TABLET | Freq: Once | ORAL | Status: AC
  Administered 2017-12-02: 20 meq via ORAL
  Filled 2017-12-02: qty 1

## 2017-12-02 NOTE — Progress Notes (Signed)
PROGRESS NOTE                                                                                                                                                                                                             Patient Demographics:    Lauren Crosby, is a 84 y.o. female, DOB - 06/18/1931, ZGY:[id number]  Admit date - 11/26/2017   Admitting Physician Ivor Costa, MD  Outpatient Primary MD for the patient is [name], MD  LOS - 5   Chief Complaint  Patient presents with  . Altered Mental Status       Brief Narrative     84 y.o. female with medical history significant of hypertension, hyperlipidemia, diabetes mellitus, GERD, gout, iron deficiency anemia, diverticulitis, kidney stone, thalassemia minor, with recent hospitalization from 10/22-11/8 due to septic shock which was possibly due to C. difficile colitis. Pt also had worsening renal function and required CRRT in ICU. Her kidney function has improved.  Her creatinine was 1.6 on 11/8. Pt was discharged on Lasix 40 mg daily to nursing home for rehab as stable condition. -Patient presents with confusion, fever, she did have episode of seizures as well, and decompensated respiratory failure where she was on BiPAP.   Subjective:   Patient in bed, appears to be in no distress but mildly confused, denies any headache, no fever, no chest pain or pressure, no shortness of breath , no abdominal pain. No focal weakness.    Assessment  & Plan :   Sepsis - she presented with fever tachycardia, elevated lactic acid and acute metabolic/toxic encephalopathy.  CT abdomen and pelvis showed left ureter punctate stones with proximal hydronephrosis, hydroureter and gas in the bladder.  Although her cultures were negative but there was clinical signs and symptoms of infection, she was initially placed on broad antibiotics which included Vancomycin, Cefepime and Flagyl, she was transitioned to IV  Rocephin on 12/01/2017 and will stop it on 12/02/2017.  All cultures are negative sepsis physiology has resolved.  Acute hypercapnic respiratory failure  - caused by acute on chronic diastolic CHF EF of 59% on echocardiogram, initially requiring BiPAP, has responded very well to IV Lasix with diuresis, continue diuresis, currently on room air, will switch to oral Lasix in the next 1 or 2 days.  Seizures  - neurology  on board, continue with Keppra  Acute metabolic/Toxic encephalopathy:  - Due to combination of sepsis, hypercapnia and possibly seizures, CT head negative, MRI nonacute, ammonia stable, ABG suggested hypercapnia as well along with sepsis.  With supportive care encephalopathy improving.  No focal deficits.  Neuro on board.  Depression: - Hold all home oral medications until mental status improves  HTN (hypertension):  - IV hydralazine PRN  GERD  - IV pepcid  Iron deficiency anemia:  - Received IV iron this admission, continue oral iron supplementation upon discharge.  Defer further work-up to PCP as age-appropriate.   CKD (chronic kidney disease), stage IV ([place]): recent cre 1.6 on 11/8-->1.43 today. BUN 27.   C. difficile diarrhea: no diarrhea currently - Resolved, will try to minimize antibiotic exposure.  Stop Rocephin on 12/02/2017.  Hypokalemia and hypomagnesemia- both replaced will continue to monitor.  DM2 with renal manifestations ([place]): Last A1c 6.7 on 05/24/17, well controled.  Currently on Lantus and sliding scale continue to monitor and adjust.    CBG (last 3)  Recent Labs    12/01/17 2255 12/02/17 0451 12/02/17 0804  GLUCAP 203* 191* 170*      Code Status : DNR  Family Communication  : I discussed with daughter over the phone in detail on 12/01/2017  Disposition Plan  : likely SNF  Consults  :  PCCM neurology  Procedures  :    TTE -  Left ventricle: The cavity size was normal. Wall thickness was increased in a pattern of mild LVH. Systolic  function was vigorous. The estimated ejection fraction was in the range of 65% to 70%. Wall motion was normal; there were no regional wall motion abnormalities. Doppler parameters are consistent with abnormal left ventricular relaxation (grade 1 diastolic  dysfunction). Doppler parameters are consistent with high ventricular filling pressure. - Ventricular septum: Septal motion showed abnormal function and dyssynergy. - Aortic valve: Peak velocity (S): 205 cm/s. - Mitral valve: Moderately calcified annulus. - Left atrium: The atrium was moderately dilated.  DVT Prophylaxis  :  Heparin  Lab Results  Component Value Date   PLT 90 (L) 12/02/2017    Antibiotics  :    Anti-infectives (From admission, onward)   Start     Dose/Rate Route Frequency Ordered Stop   12/01/17 1000  cefTRIAXone (ROCEPHIN) 1 g in sodium chloride 0.9 % 100 mL IVPB     1 g 200 mL/hr over 30 Minutes Intravenous Every 24 hours 12/01/17 0951     11/30/17 1700  cefTRIAXone (ROCEPHIN) 2 g in sodium chloride 0.9 % 100 mL IVPB  Status:  Discontinued     2 g 200 mL/hr over 30 Minutes Intravenous Every 24 hours 11/30/17 1148 12/01/17 0941   11/29/17 2200  vancomycin (VANCOCIN) 1,500 mg in sodium chloride 0.9 % 500 mL IVPB  Status:  Discontinued     1,500 mg 250 mL/hr over 120 Minutes Intravenous Every 48 hours 11/28/17 1418 11/29/17 1242   11/28/17 1800  ceFEPIme (MAXIPIME) 1 g in sodium chloride 0.9 % 100 mL IVPB  Status:  Discontinued     1 g 200 mL/hr over 30 Minutes Intravenous Every 24 hours 11/28/17 1418 11/30/17 1148   11/28/17 0000  vancomycin (VANCOCIN) 1,250 mg in sodium chloride 0.9 % 250 mL IVPB  Status:  Discontinued     1,250 mg 166.7 mL/hr over 90 Minutes Intravenous Every 24 hours 11/27/17 0159 11/28/17 1418   11/27/17 1800  ceFEPIme (MAXIPIME) 2 g in sodium chloride 0.9 %  100 mL IVPB  Status:  Discontinued     2 g 200 mL/hr over 30 Minutes Intravenous Every 24 hours 11/27/17 1502 11/28/17 1418   11/27/17  0200  metroNIDAZOLE (FLAGYL) IVPB 500 mg  Status:  Discontinued     500 mg 100 mL/hr over 60 Minutes Intravenous Every 8 hours 11/27/17 0126 12/01/17 0941   11/27/17 0200  aztreonam (AZACTAM) 1 g in sodium chloride 0.9 % 100 mL IVPB  Status:  Discontinued     1 g 200 mL/hr over 30 Minutes Intravenous Every 8 hours 11/27/17 0159 11/27/17 1502   11/27/17 0200  vancomycin (VANCOCIN) 2,000 mg in sodium chloride 0.9 % 500 mL IVPB     2,000 mg 250 mL/hr over 120 Minutes Intravenous  Once 11/27/17 0159 11/27/17 0606   11/26/17 2200  cefTRIAXone (ROCEPHIN) 2 g in sodium chloride 0.9 % 100 mL IVPB  Status:  Discontinued     2 g 200 mL/hr over 30 Minutes Intravenous Every 24 hours 11/26/17 2140 11/27/17 0125        Objective:   Vitals:   12/01/17 0830 12/01/17 1648 12/02/17 0700 12/02/17 0813  BP:      Pulse:   85 86  Resp:    (!) 23  Temp:  98.6 F (37 C)    TempSrc:  Oral    SpO2: 95%   96%  Weight:      Height:        Wt Readings from Last 3 Encounters:  12/01/17 103.8 kg  11/22/17 109.8 kg  11/19/17 109.8 kg     Intake/Output Summary (Last 24 hours) at 12/02/2017 0943 Last data filed at 12/01/2017 2109 Gross per 24 hour  Intake 1059.69 ml  Output 1200 ml  Net -140.31 ml     Physical Exam  Awake but pleasantly confused in no distress, follows basic commands moves all 4 extremities, Foley in place [place].AT,PERRAL Supple Neck,No JVD, No cervical lymphadenopathy appriciated.  Symmetrical Chest wall movement, Good air movement bilaterally, CTAB RRR,No Gallops, Rubs or new Murmurs, No Parasternal Heave +ve B.Sounds, Abd Soft, No tenderness, No organomegaly appriciated, No rebound - guarding or rigidity. No Cyanosis, Clubbing or edema, No new Rash or bruise     Data Review:    CBC Recent Labs  Lab 11/26/17 2152  11/28/17 0327 11/29/17 0256 11/30/17 0401 12/01/17 0448 12/02/17 0431  WBC 7.2   < > 4.4 4.5 3.4* 3.5* 3.9*  HGB 10.2*   < > 7.6* 8.0* 8.1* 7.7* 7.9*    HCT 33.4*   < > 25.9* 27.0* 27.0* 26.1* 26.9*  PLT 160   < > 107* 104* 100* 109* 90*  MCV 73.1*   < > 74.4* 74.8* 73.8* 73.9* 72.7*  MCH 22.3*   < > 21.8* 22.2* 22.1* 21.8* 21.4*  MCHC 30.5   < > 29.3* 29.6* 30.0 29.5* 29.4*  RDW 20.0*   < > 20.6* 20.7* 20.2* 20.2* 19.9*  LYMPHSABS 0.6*  --   --   --   --   --   --   MONOABS 0.2  --   --   --   --   --   --   EOSABS 0.0  --   --   --   --   --   --   BASOSABS 0.0  --   --   --   --   --   --    < > = values in this interval not displayed.  Chemistries  Recent Labs  Lab 11/26/17 2152  11/28/17 0327 11/29/17 0256 11/30/17 0401 12/01/17 0448 12/02/17 0431  NA 138   < > 143 144 149* 144 141  K 4.2   < > 3.0* 3.7 3.4* 3.2* 3.2*  CL 96*   < > 108 111 114* 106 105  CO2 30   < > 25 26 27 29 27   GLUCOSE 300*   < > 205* 137* 121* 225* 204*  BUN 27*   < > 35* 37* 32* 28* 23  CREATININE 1.43*   < > 1.73* 1.56* 1.53* 1.39* 1.22*  CALCIUM 9.3   < > 7.6* 7.7* 8.3* 8.1* 8.4*  MG  --   --   --   --   --   --  1.4*  AST 29  --   --   --   --   --   --   ALT 21  --   --   --   --   --   --   ALKPHOS 137*  --   --   --   --   --   --   BILITOT 1.0  --   --   --   --   --   --    < > = values in this interval not displayed.   ------------------------------------------------------------------------------------------------------------------ No results for input(s): CHOL, HDL, LDLCALC, TRIG, CHOLHDL, LDLDIRECT in the last 72 hours.  Lab Results  Component Value Date   HGBA1C 6.7 (H) 05/24/2017   ------------------------------------------------------------------------------------------------------------------ No results for input(s): TSH, T4TOTAL, T3FREE, THYROIDAB in the last 72 hours.  Invalid input(s): FREET3 ------------------------------------------------------------------------------------------------------------------ No results for input(s): VITAMINB12, FOLATE, FERRITIN, TIBC, IRON, RETICCTPCT in the last 72 hours.  Coagulation  profile Recent Labs  Lab 11/27/17 0327  INR 1.20    No results for input(s): DDIMER in the last 72 hours.  Cardiac Enzymes No results for input(s): CKMB, TROPONINI, MYOGLOBIN in the last 168 hours.  Invalid input(s): CK ------------------------------------------------------------------------------------------------------------------    Component Value Date/Time   BNP 859.9 (H) 11/27/2017 0327   BNP 18.5 08/14/2012 0930    Inpatient Medications  Scheduled Meds: . chlorhexidine  15 mL Mouth Rinse BID  . furosemide  60 mg Intravenous Q12H  . heparin  5,000 Units Subcutaneous Q8H  . insulin aspart  0-5 Units Subcutaneous QHS  . insulin aspart  0-9 Units Subcutaneous TID WC  . insulin glargine  7 Units Subcutaneous QHS  . ipratropium  0.5 mg Nebulization BID  . levalbuterol  1.25 mg Nebulization BID  . mouth rinse  15 mL Mouth Rinse q12n4p  . potassium chloride  40 mEq Oral Daily   Continuous Infusions: . sodium chloride Stopped (12/01/17 1519)  . cefTRIAXone (ROCEPHIN)  IV Stopped (12/01/17 1142)  . famotidine (PEPCID) IV 20 mg (12/01/17 2312)  . levETIRAcetam 1,000 mg (12/02/17 0235)  . magnesium sulfate 1 - 4 g bolus IVPB     PRN Meds:.sodium chloride, acetaminophen **OR** acetaminophen, hydrALAZINE, [DISCONTINUED] ondansetron **OR** ondansetron (ZOFRAN) IV  Micro Results Recent Results (from the past 240 hour(s))  Blood Culture (routine x 2)     Status: None   Collection Time: 11/26/17  9:52 PM  Result Value Ref Range Status   Specimen Description BLOOD RIGHT HAND  Final   Special Requests   Final    BOTTLES DRAWN AEROBIC AND ANAEROBIC Blood Culture adequate volume   Culture   Final    NO GROWTH 5 DAYS Performed at [facility]  [facility], [place] [address], [place], [place] [address]    Report Status 12/01/2017 FINAL  Final  Urine culture     Status: None   Collection Time: 11/26/17 10:37 PM  Result Value Ref Range Status   Specimen Description URINE, CATHETERIZED   Final   Special Requests NONE  Final   Culture   Final    NO GROWTH Performed at [facility], [place] [address], [place], [place] [address]    Report Status 11/28/2017 FINAL  Final  Blood Culture (routine x 2)     Status: None (Preliminary result)   Collection Time: 11/26/17 11:39 PM  Result Value Ref Range Status   Specimen Description BLOOD LEFT FOREARM  Final   Special Requests   Final    BOTTLES DRAWN AEROBIC AND ANAEROBIC Blood Culture adequate volume   Culture   Final    NO GROWTH 4 DAYS Performed at [facility], [place] [address] [place], [place] [address]    Report Status PENDING  Incomplete  Respiratory Panel by PCR     Status: None   Collection Time: 11/27/17  1:38 PM  Result Value Ref Range Status   Adenovirus NOT DETECTED NOT DETECTED Final   Coronavirus 229E NOT DETECTED NOT DETECTED Final   Coronavirus HKU1 NOT DETECTED NOT DETECTED Final   Coronavirus NL63 NOT DETECTED NOT DETECTED Final   Coronavirus OC43 NOT DETECTED NOT DETECTED Final   Metapneumovirus NOT DETECTED NOT DETECTED Final   Rhinovirus / Enterovirus NOT DETECTED NOT DETECTED Final   Influenza A NOT DETECTED NOT DETECTED Final   Influenza B NOT DETECTED NOT DETECTED Final   Parainfluenza Virus 1 NOT DETECTED NOT DETECTED Final   Parainfluenza Virus 2 NOT DETECTED NOT DETECTED Final   Parainfluenza Virus 3 NOT DETECTED NOT DETECTED Final   Parainfluenza Virus 4 NOT DETECTED NOT DETECTED Final   Respiratory Syncytial Virus NOT DETECTED NOT DETECTED Final   Bordetella pertussis NOT DETECTED NOT DETECTED Final   Chlamydophila pneumoniae NOT DETECTED NOT DETECTED Final   Mycoplasma pneumoniae NOT DETECTED NOT DETECTED Final    Comment: Performed at [facility], [place] [address] [place],  [address]    Radiology Reports Ct Abdomen Pelvis Wo Contrast  Result Date: 11/27/2017 CLINICAL DATA:  Abdominal distention, altered mental status, sepsis. History of Clostridium difficile  colitis, open wound of the abdominal wall, nephrolithiasis, hernia, gastroesophageal reflux disease, diverticulitis, colon polyp, cellulitis and abscess, adrenal adenoma, ventral hernia repair, hysterectomy, sigmoid resection, cholecystectomy, appendectomy. EXAM: CT ABDOMEN AND PELVIS WITHOUT CONTRAST TECHNIQUE: Multidetector CT imaging of the abdomen and pelvis was performed following the standard protocol without IV contrast. COMPARISON:  10/30/2017 FINDINGS: Lower chest: Evaluation is limited due to motion artifact. Atelectasis or consolidation in the lung bases, greater on the left. Coronary artery calcifications. Calcification in the mitral valve annulus. Hepatobiliary: No focal liver abnormality is seen. Status post cholecystectomy. No biliary dilatation. Pancreas: Fatty infiltration of the pancreas. Spleen: Normal in size without focal abnormality. Adrenals/Urinary Tract: 3.1 cm diameter left adrenal gland nodule. No change since previous study. Fat attenuation consistent with benign adenoma. Bilateral renal parenchymal atrophy. Mild left hydronephrosis and hydroureter with tiny punctate stones suggested in the distal left ureter. Bladder wall is not thickened. Gas in the bladder may come from instrumentation or infection. Exophytic lesion arising from the right kidney without change since prior study. Hyperdensity likely indicating a hemorrhagic cyst. Stomach/Bowel: Stomach, small bowel, and colon are not abnormally distended.  Scattered stool throughout the colon. Scattered colonic diverticula. No evidence of diverticulitis. Prominent stool in the rectum with mild rectal wall thickening may indicate stercoral colitis. Infiltration in the presacral fat is unchanged since prior study, possibly inflammatory or reactive. Vascular/Lymphatic: Aortic atherosclerosis. No enlarged abdominal or pelvic lymph nodes. Reproductive: Status post hysterectomy. No adnexal masses. Other: Small amount of free fluid around the  liver and spleen. Likely ascites. Scarring in the anterior abdominal wall consistent with postoperative change. Edema in the subcutaneous fat. No free air in the abdomen. Musculoskeletal: Degenerative changes in the spine. No destructive bone lesions. IMPRESSION: 1. Atelectasis or consolidation in the lung bases, greater on the left. 2. Left adrenal gland adenoma. 3. Bilateral renal parenchymal atrophy. Punctate stones suggested in the distal left ureter. Mild proximal hydronephrosis and hydroureter. Gas in the bladder may be due to instrumentation or infection. 4. Prominent stool in the rectum with rectal wall thickening suggesting possible stercoral colitis. 5. Small amount of free fluid in the abdomen and pelvis, likely ascites. 6. Probable hemorrhagic cysts in the right kidney. Aortic Atherosclerosis (ICD10-I70.0). Electronically Signed   By: Lucienne Capers M.D.   On: 11/27/2017 03:10   Dg Abd 1 View  Result Date: 11/09/2017 CLINICAL DATA:  Patient pulled out nasogastric catheter EXAM: ABDOMEN - 1 VIEW COMPARISON:  11/07/2017 FINDINGS: Scattered large and small bowel gas is noted. The previously seen nasogastric catheter has been removed in the interval. Despite the given clinical history of metal tipped catheter this catheter on previous images represented a standard nasogastric catheter without metallic weighting. No foreign body is noted. IMPRESSION: No evidence of retained foreign body as the prior nasogastric catheter was not a weighted tipped catheter based on previous images. Electronically Signed   By: Inez Catalina M.D.   On: 11/09/2017 19:57   Dg Abd 1 View  Result Date: 11/07/2017 CLINICAL DATA:  84 year old female with feeding tube placement. EXAM: ABDOMEN - 1 VIEW COMPARISON:  Earlier abdominal radiograph dated 11/07/2017 FINDINGS: No significant change in the appearance or positioning of the enteric tube which appears somewhat folded, likely in the mid to distal stomach. IMPRESSION: No  significant interval change. Electronically Signed   By: Anner Crete M.D.   On: 11/07/2017 23:00   Dg Abd 1 View  Result Date: 11/03/2017 CLINICAL DATA:  Abdominal distension. EXAM: ABDOMEN - 1 VIEW COMPARISON:  CT, 10/30/2017 FINDINGS: Normal bowel gas pattern with no evidence of obstruction. Soft tissues are not well-defined due to body habitus. No convincing renal or ureteral stones. Status post cholecystectomy. No acute skeletal abnormality. IMPRESSION: 1. No acute findings.  No evidence of bowel obstruction. Electronically Signed   By: Lajean Manes M.D.   On: 11/03/2017 11:17   Ct Head Wo Contrast  Result Date: 11/27/2017 CLINICAL DATA:  Altered mental status. History of hypertension, hepatitis, diabetes, edema. EXAM: CT HEAD WITHOUT CONTRAST TECHNIQUE: Contiguous axial images were obtained from the base of the skull through the vertex without intravenous contrast. COMPARISON:  MRI brain 11/29/2016. CT head 11/24/2016. FINDINGS: Brain: Diffuse cerebral atrophy. Ventricular dilatation consistent with central atrophy. Low-attenuation changes throughout the deep white matter consistent with small vessel ischemia. No mass-effect or midline shift. No abnormal extra-axial fluid collections. Gray-white matter junctions are distinct. Basal cisterns are not effaced. No acute intracranial hemorrhage. Vascular: Moderate intracranial arterial vascular calcifications are present. Skull: Calvarium appears intact. Sinuses/Orbits: Paranasal sinuses and mastoid air cells are clear. Other: None. IMPRESSION: No acute intracranial abnormalities. Chronic atrophy and small vessel ischemic  changes. Electronically Signed   By: Lucienne Capers M.D.   On: 11/27/2017 03:02   Mr Brain Wo Contrast  Result Date: 11/30/2017 CLINICAL DATA:  Initial evaluation for acute altered mental status, seizure, unclear cause. EXAM: MRI HEAD WITHOUT CONTRAST TECHNIQUE: Multiplanar, multiecho pulse sequences of the brain and  surrounding structures were obtained without intravenous contrast. COMPARISON:  Prior CT from 11/27/2017. FINDINGS: Brain: Diffuse prominence of the CSF containing spaces compatible with generalized age-related cerebral atrophy, relatively stable in appearance as compared to most recent MRI from 11/29/2016. There has been interval development of extensive T2/FLAIR signal abnormality throughout the supratentorial and infratentorial cerebral white matter, involving both cerebral hemispheres in a fairly symmetric pattern. Slight posterior predominance involving the parieto-occipital regions noted. Involvement of both internal and external capsules. Patchy infratentorial involvement involving the bilateral cerebellar hemispheres. Suggestion of associated mild edema as a few cortical gyri appear slightly expanded as compared to previous exam (series 10, image 20). Sparing of the overlying cortical gray matter which is relatively normal in appearance. Findings are nonspecific. Superimposed chronic microvascular ischemic changes noted within the pons, otherwise similar to previous. No abnormal foci of restricted diffusion to suggest acute or subacute ischemia. No encephalomalacia to suggest chronic cortical infarction. No foci of susceptibility artifact to suggest acute or chronic intracranial hemorrhage. No mass lesion, midline shift or mass effect. Stable ventricular prominence related global parenchymal volume loss without hydrocephalus. No extra-axial fluid collection. Pituitary gland normal. Midline structures intact. Vascular: Major intracranial vascular flow voids are maintained. Skull and upper cervical spine: Craniocervical junction within normal limits. No focal marrow replacing lesion. Scalp soft tissues unremarkable. Sinuses/Orbits: Patient status post ocular lens replacement bilaterally. Paranasal sinuses are clear. Small right mastoid effusion noted. Other: None. IMPRESSION: Extensive abnormal T2/FLAIR signal  abnormality throughout the cerebral white matter as above, new relative to most recent MRI from 11/29/2016. Findings are nonspecific, with primary differential considerations including PRES versus toxic/metabolic derangement. Anoxic brain injury felt to be unlikely given the relative sparing of the gray matter structures. Correlation with laboratory values and history recommended. Electronically Signed   By: Jeannine Boga M.D.   On: 11/30/2017 14:18   Ir Fluoro Guide Cv Line Right  Result Date: 11/03/2017 INDICATION: End-stage renal disease. In need intravenous access for the initiation of hemodialysis. EXAM: NON-TUNNELED CENTRAL VENOUS HEMODIALYSIS CATHETER PLACEMENT WITH ULTRASOUND AND FLUOROSCOPIC GUIDANCE COMPARISON:  Chest radiograph - 10/31/2017 MEDICATIONS: None FLUOROSCOPY TIME:  24 seconds (14 mGy) COMPLICATIONS: None immediate. PROCEDURE: Informed written consent was obtained from patient's family after a discussion of the risks, benefits, and alternatives to treatment. Questions regarding the procedure were encouraged and answered. The right neck and chest were prepped with chlorhexidine in a sterile fashion, and a sterile drape was applied covering the operative field. Maximum barrier sterile technique with sterile gowns and gloves were used for the procedure. A timeout was performed prior to the initiation of the procedure. After the overlying soft tissues were anesthetized, a small venotomy incision was created and a micropuncture kit was utilized to access the internal jugular vein. Real-time ultrasound guidance was utilized for vascular access including the acquisition of a permanent ultrasound image documenting patency of the accessed vessel. The microwire was utilized to measure appropriate catheter length. A stiff glidewire was advanced to the level of the IVC. Under fluoroscopic guidance, the venotomy was serially dilated, ultimately allowing placement of a 20 cm temporary Trialysis  catheter with tip ultimately terminating within the superior aspect of the right atrium. Final  catheter positioning was confirmed and documented with a spot radiographic image. The catheter aspirates and flushes normally. The catheter was flushed with appropriate volume heparin dwells. The catheter exit site was secured with a 0-Prolene retention suture. A dressing was placed. The patient tolerated the procedure well without immediate post procedural complication. IMPRESSION: Successful placement of a right internal jugular approach 20 cm temporary dialysis catheter with tip terminating with in the superior aspect of the right atrium. The catheter is ready for immediate use. PLAN: This catheter may be converted to a tunneled dialysis catheter at a later date as indicated. Electronically Signed   By: Sandi Mariscal M.D.   On: 11/03/2017 18:01   Ir US Guide Vasc Access Right  Result Date: 11/03/2017 INDICATION: End-stage renal disease. In need intravenous access for the initiation of hemodialysis. EXAM: NON-TUNNELED CENTRAL VENOUS HEMODIALYSIS CATHETER PLACEMENT WITH ULTRASOUND AND FLUOROSCOPIC GUIDANCE COMPARISON:  Chest radiograph - 10/31/2017 MEDICATIONS: None FLUOROSCOPY TIME:  24 seconds (14 mGy) COMPLICATIONS: None immediate. PROCEDURE: Informed written consent was obtained from patient's family after a discussion of the risks, benefits, and alternatives to treatment. Questions regarding the procedure were encouraged and answered. The right neck and chest were prepped with chlorhexidine in a sterile fashion, and a sterile drape was applied covering the operative field. Maximum barrier sterile technique with sterile gowns and gloves were used for the procedure. A timeout was performed prior to the initiation of the procedure. After the overlying soft tissues were anesthetized, a small venotomy incision was created and a micropuncture kit was utilized to access the internal jugular vein. Real-time ultrasound  guidance was utilized for vascular access including the acquisition of a permanent ultrasound image documenting patency of the accessed vessel. The microwire was utilized to measure appropriate catheter length. A stiff glidewire was advanced to the level of the IVC. Under fluoroscopic guidance, the venotomy was serially dilated, ultimately allowing placement of a 20 cm temporary Trialysis catheter with tip ultimately terminating within the superior aspect of the right atrium. Final catheter positioning was confirmed and documented with a spot radiographic image. The catheter aspirates and flushes normally. The catheter was flushed with appropriate volume heparin dwells. The catheter exit site was secured with a 0-Prolene retention suture. A dressing was placed. The patient tolerated the procedure well without immediate post procedural complication. IMPRESSION: Successful placement of a right internal jugular approach 20 cm temporary dialysis catheter with tip terminating with in the superior aspect of the right atrium. The catheter is ready for immediate use. PLAN: This catheter may be converted to a tunneled dialysis catheter at a later date as indicated. Electronically Signed   By: Sandi Mariscal M.D.   On: 11/03/2017 18:01   Dg Chest Port 1 View  Result Date: 12/02/2017 CLINICAL DATA:  Shortness of breath EXAM: PORTABLE CHEST 1 VIEW COMPARISON:  11/30/2017 and prior exams FINDINGS: Cardiomegaly and pulmonary vascular congestion again noted. LEFT LOWER lung consolidation/atelectasis and mild RIGHT basilar atelectasis again noted. No pneumothorax. There has been little interval change since prior studies IMPRESSION: Unchanged appearance of the chest with continued LEFT LOWER lung consolidation/atelectasis and mild RIGHT basilar atelectasis. Electronically Signed   By: Margarette Canada M.D.   On: 12/02/2017 08:12   Dg Chest Port 1 View  Result Date: 11/30/2017 CLINICAL DATA:  Shortness of breath, hypertension  EXAM: PORTABLE CHEST 1 VIEW COMPARISON:  11/28/2017 FINDINGS: Cardiomegaly with vascular congestion and probable mild pulmonary edema. Findings are similar prior study. Suspect small layering effusions. No acute bony  abnormality. IMPRESSION: Cardiomegaly with mild pulmonary edema and small layering effusions. No real change since prior study. Electronically Signed   By: Rolm Baptise M.D.   On: 11/30/2017 08:54   Dg Chest Port 1 View  Result Date: 11/28/2017 CLINICAL DATA:  Hypoxia. EXAM: PORTABLE CHEST 1 VIEW COMPARISON:  Radiograph November 26, 2017. FINDINGS: Stable cardiomegaly is noted with central pulmonary vascular congestion. No pneumothorax or pleural effusion is noted. Possible mild bilateral pulmonary edema may be present. Bony thorax is unremarkable. IMPRESSION: Stable cardiomegaly with central pulmonary vascular congestion and possible mild bilateral pulmonary edema. Electronically Signed   By: Marijo Conception, M.D.   On: 11/28/2017 12:55   Dg Chest Port 1 View  Result Date: 11/26/2017 CLINICAL DATA:  Altered mental status EXAM: PORTABLE CHEST 1 VIEW COMPARISON:  11/13/2017 FINDINGS: AP portable semi upright view of the chest. The patient is rotated to the right. Stable cardiomegaly with interstitial edema. No pulmonary confluence, effusion or pneumothorax. No acute osseous abnormality. IMPRESSION: Cardiomegaly with interstitial edema.  No significant change. Electronically Signed   By: Ashley Royalty M.D.   On: 11/26/2017 22:48   Dg Chest Port 1 View  Result Date: 11/13/2017 CLINICAL DATA:  Shortness of breath EXAM: PORTABLE CHEST 1 VIEW COMPARISON:  11/09/2017 FINDINGS: Cardiomegaly and slightly increased pulmonary vascular congestion noted. Mild bibasilar opacities/atelectasis noted in this low volume film. A RIGHT central venous catheter with tips overlying the LOWER SVC/SUPERIOR cavoatrial junction noted. No pneumothorax. IMPRESSION: Cardiomegaly with pulmonary vascular congestion and  mild bibasilar opacities/atelectasis. Electronically Signed   By: Margarette Canada M.D.   On: 11/13/2017 07:49   Dg Chest Port 1 View  Result Date: 11/09/2017 CLINICAL DATA:  Patient removed recent nasogastric catheter EXAM: PORTABLE CHEST 1 VIEW COMPARISON:  11/08/2017 FINDINGS: Cardiac shadow is enlarged. Right jugular central line is again seen and stable. Inspiratory effort is poor with crowding of the vascular markings. No focal confluent infiltrate is seen. No radiopaque foreign body is noted. The previously seen nasogastric catheter has been removed and as previously described was not a weighted tipped catheter. IMPRESSION: Poor inspiratory effort.  No acute abnormality noted. Electronically Signed   By: Inez Catalina M.D.   On: 11/09/2017 19:58   Dg Chest Port 1 View  Result Date: 11/08/2017 CLINICAL DATA:  Respiratory failure. EXAM: PORTABLE CHEST 1 VIEW COMPARISON:  11/07/2017 and older exams. FINDINGS: Mild enlargement of the cardiopericardial silhouette, stable. Prominent bronchovascular markings, with additional lung base opacity, the latter consistent with atelectasis, also without change from the previous day's exam. New nasal/orogastric tube passes below the included field of view, likely into the stomach. Right subclavian dual lumen central venous catheter is stable, tip in the right atrium. No pneumothorax. IMPRESSION: 1. No significant change in lung aeration from the previous day's study. There are prominent bronchovascular markings and lung base atelectasis, but no convincing pulmonary edema or pneumonia. 2. New nasal/orogastric tube. Tip not visualized, but likely extends into the stomach. Electronically Signed   By: Lajean Manes M.D.   On: 11/08/2017 07:13   Dg Chest Port 1 View  Result Date: 11/07/2017 CLINICAL DATA:  Acute respiratory failure with hypoxia. EXAM: PORTABLE CHEST 1 VIEW COMPARISON:  10/31/2017. FINDINGS: Cardiomegaly. Improved lung volumes. Increased perihilar markings  could represent early edema or viral pneumonitis. Other than improved lung markings, similar appearance to priors. Dialysis catheter has been inserted via RIGHT IJ approach. Tip slide in the RIGHT atrium. There is no pneumothorax. IMPRESSION: Satisfactory catheter placement. No  significant change in the appearance of the lungs except for improved volume. Cardiomegaly. Electronically Signed   By: Staci Righter M.D.   On: 11/07/2017 11:30   Dg Abd Portable 1v  Result Date: 11/07/2017 CLINICAL DATA:  Nasogastric tube placement. EXAM: PORTABLE ABDOMEN - 1 VIEW COMPARISON:  11/03/2017 FINDINGS: Interval nasogastric tube folded back upon itself in the mid to distal stomach with its tip in the mid stomach. The included bowel gas pattern is normal. Prominent interstitial markings at the left lung base. Lower thoracic spine degenerative changes. IMPRESSION: Nasogastric tube folded back upon itself in the mid to distal stomach with its tip in the mid stomach. Electronically Signed   By: Claudie Revering M.D.   On: 11/07/2017 13:13     Signature  Lala Lund M.D on 12/02/2017 at 9:43 AM   -  To page go to [email or web address] - password [facility]

## 2017-12-02 NOTE — Progress Notes (Signed)
Foley care performed.

## 2017-12-02 NOTE — Progress Notes (Signed)
  Speech Language Pathology Treatment: Dysphagia  Patient Details Name: Lauren Crosby MRN: [id number] DOB: 15-Feb-1931 Today's Date: 12/02/2017 Time: 2585-2778 SLP Time Calculation (min) (ACUTE ONLY): 12 min  Assessment / Plan / Recommendation Clinical Impression  Pt consumed POs without overt signs of aspiration or overt concern for esophageal difficulties. She preferred pureed textures over soft, bite-sized pieces. Mastication and oral transit are prolonged, but her oral clearance is adequate. SLP alternated solids/liquids given concern for esophageal dysphagia during initial evaluation. RN reports no overt difficulties with meals/meds so far. Would continue current diet for now.   HPI HPI: 84 y.o.femalewith medical history significant ofhypertension, hyperlipidemia, diabetes mellitus, GERD, gout, iron deficiency anemia, diverticulitis, kidney stone, thalassemia minor, with recent hospitalization from 10/22-11/8 due to septic shock which was possibly due to C. difficile colitis. requiredCRRT in ICU.  Function recovered with no further need of dialysis.  Briefly seen last admission by SLP to slowly advance diet from thin and puree to chewable textures due to confusion.       SLP Plan  Continue with current plan of care       Recommendations  Diet recommendations: Dysphagia 2 (fine chop);Thin liquid Liquids provided via: Cup;Straw Medication Administration: Whole meds with puree Supervision: Full supervision/cueing for compensatory strategies Compensations: Small sips/bites;Slow rate Postural Changes and/or Swallow Maneuvers: Seated upright 90 degrees;Upright 30-60 min after meal                Oral Care Recommendations: Oral care BID Follow up Recommendations: Skilled Nursing facility SLP Visit Diagnosis: Dysphagia, unspecified (R13.10) Plan: Continue with current plan of care       GO                Germain Osgood 12/02/2017, 11:20 AM  Germain Osgood,  M.A. [place] Acute Environmental education officer [phone] Office [phone]

## 2017-12-03 LAB — BASIC METABOLIC PANEL
Anion gap: 8 (ref 5–15)
BUN: 21 mg/dL (ref 8–23)
CHLORIDE: 103 mmol/L (ref 98–111)
CO2: 29 mmol/L (ref 22–32)
CREATININE: 1.21 mg/dL — AB (ref 0.44–1.00)
Calcium: 8.4 mg/dL — ABNORMAL LOW (ref 8.9–10.3)
GFR calc Af Amer: 46 mL/min — ABNORMAL LOW (ref >60)
GFR calc non Af Amer: 40 mL/min — ABNORMAL LOW (ref >60)
GLUCOSE: 199 mg/dL — AB (ref 70–99)
Potassium: 3.4 mmol/L — ABNORMAL LOW (ref 3.5–5.1)
Sodium: 140 mmol/L (ref 135–145)

## 2017-12-03 LAB — GLUCOSE, CAPILLARY
GLUCOSE-CAPILLARY: 206 mg/dL — AB (ref 70–99)
GLUCOSE-CAPILLARY: 265 mg/dL — AB (ref 70–99)
Glucose-Capillary: 189 mg/dL — ABNORMAL HIGH (ref 70–99)
Glucose-Capillary: 219 mg/dL — ABNORMAL HIGH (ref 70–99)

## 2017-12-03 LAB — MAGNESIUM: Magnesium: 1.7 mg/dL (ref 1.7–2.4)

## 2017-12-03 MED ORDER — HYDROCODONE-ACETAMINOPHEN 5-325 MG PO TABS: 1.0000 | ORAL_TABLET | ORAL | Status: DC | PRN

## 2017-12-03 MED ORDER — POTASSIUM CHLORIDE 10 MEQ/100ML IV SOLN
10.0000 meq | INTRAVENOUS | Status: AC
  Administered 2017-12-03 (×4): 10 meq via INTRAVENOUS
  Filled 2017-12-03 (×3): qty 100

## 2017-12-03 MED ORDER — MAGNESIUM SULFATE 2 GM/50ML IV SOLN
2.0000 g | Freq: Once | INTRAVENOUS | Status: AC
  Administered 2017-12-03: 2 g via INTRAVENOUS
  Filled 2017-12-03: qty 50

## 2017-12-03 MED ORDER — HYDROCODONE-ACETAMINOPHEN 5-325 MG PO TABS
1.0000 | ORAL_TABLET | ORAL | Status: DC | PRN
  Administered 2017-12-03 – 2017-12-05 (×3): 1 via ORAL
  Filled 2017-12-03 (×5): qty 1

## 2017-12-03 MED ORDER — CYCLOBENZAPRINE HCL 5 MG PO TABS
5.0000 mg | ORAL_TABLET | Freq: Three times a day (TID) | ORAL | Status: DC | PRN
  Administered 2017-12-03 – 2017-12-04 (×3): 5 mg via ORAL
  Filled 2017-12-03 (×3): qty 1

## 2017-12-03 MED ORDER — POTASSIUM CHLORIDE 10 MEQ/100ML IV SOLN
INTRAVENOUS | Status: AC
  Administered 2017-12-03: 10 meq via INTRAVENOUS
  Filled 2017-12-03: qty 100

## 2017-12-03 MED ORDER — FUROSEMIDE 40 MG PO TABS
60.0000 mg | ORAL_TABLET | Freq: Every day | ORAL | Status: DC
  Administered 2017-12-03 – 2017-12-04 (×2): 60 mg via ORAL
  Filled 2017-12-03 (×2): qty 1

## 2017-12-03 MED ORDER — MORPHINE SULFATE (PF) 2 MG/ML IV SOLN
2.0000 mg | Freq: Once | INTRAVENOUS | Status: AC
  Administered 2017-12-03: 2 mg via INTRAVENOUS
  Filled 2017-12-03: qty 1

## 2017-12-03 MED ORDER — HALOPERIDOL LACTATE 5 MG/ML IJ SOLN
2.0000 mg | Freq: Four times a day (QID) | INTRAMUSCULAR | Status: DC | PRN
  Administered 2017-12-06: 2 mg via INTRAVENOUS
  Filled 2017-12-03: qty 1

## 2017-12-03 MED ORDER — FAMOTIDINE 20 MG PO TABS
20.0000 mg | ORAL_TABLET | Freq: Every day | ORAL | Status: DC
  Administered 2017-12-03 – 2017-12-05 (×3): 20 mg via ORAL
  Filled 2017-12-03 (×3): qty 1

## 2017-12-03 MED ORDER — LEVETIRACETAM 500 MG PO TABS
1000.0000 mg | ORAL_TABLET | Freq: Two times a day (BID) | ORAL | Status: DC
  Administered 2017-12-03 – 2017-12-06 (×7): 1000 mg via ORAL
  Filled 2017-12-03 (×7): qty 2

## 2017-12-03 MED ORDER — DIPHENHYDRAMINE HCL 25 MG PO CAPS
25.0000 mg | ORAL_CAPSULE | Freq: Once | ORAL | Status: AC
  Administered 2017-12-03: 25 mg via ORAL
  Filled 2017-12-03: qty 1

## 2017-12-03 MED ORDER — TAMSULOSIN HCL 0.4 MG PO CAPS
0.4000 mg | ORAL_CAPSULE | Freq: Every day | ORAL | Status: DC
  Administered 2017-12-03 – 2017-12-06 (×4): 0.4 mg via ORAL
  Filled 2017-12-03 (×4): qty 1

## 2017-12-03 NOTE — Progress Notes (Signed)
PROGRESS NOTE                                                                                                                                                                                                             Patient Demographics:    Lauren Crosby, is a 84 y.o. female, DOB - Nov 11, 1931, ONG:[id number]  Admit date - 11/26/2017   Admitting Physician Ivor Costa, MD  Outpatient Primary MD for the patient is [name], MD  LOS - 6   Chief Complaint  Patient presents with  . Altered Mental Status       Brief Narrative     84 y.o. female with medical history significant of hypertension, hyperlipidemia, diabetes mellitus, GERD, gout, iron deficiency anemia, diverticulitis, kidney stone, thalassemia minor, with recent hospitalization from 10/22-11/8 due to septic shock which was possibly due to C. difficile colitis. Pt also had worsening renal function and required CRRT in ICU. Her kidney function has improved.  Her creatinine was 1.6 on 11/8. Pt was discharged on Lasix 40 mg daily to nursing home for rehab as stable condition. -Patient presents with confusion, fever, she did have episode of seizures as well, and decompensated respiratory failure where she was on BiPAP.   Subjective:   Patient in bed, appears comfortable, denies any headache, no fever, no chest pain or pressure, no shortness of breath , no abdominal pain. No focal weakness.   Assessment  & Plan :   Sepsis - she presented with fever tachycardia, elevated lactic acid and acute metabolic/toxic encephalopathy.  CT abdomen and pelvis showed left ureter punctate stones with proximal hydronephrosis, hydroureter and gas in the bladder.  Although her cultures were negative but there was clinical signs and symptoms of infection, sepsis pathophysiology has resolved, all cultures are negative, all antibiotics were stopped on 12/02/2017.  Acute hypercapnic respiratory failure  -  caused by acute on chronic diastolic CHF EF of 44% on echocardiogram, initially requiring BiPAP, she has been adequately diuresed with IV Lasix was switched to 60 mg of oral Lasix on 12/03/2017.  Seizures  - by neurology, appears to be seizure-free, switch to oral Keppra and monitor.  Acute metabolic/Toxic encephalopathy:  - Due to combination of sepsis, hypercapnia and possibly seizures, CT head negative, MRI nonacute, ammonia stable, ABG suggested hypercapnia as well along  with sepsis.  Has been seen by neurology, with supportive care her encephalopathy has improved moderately, remains at risk for delirium, minimize narcotics and benzodiazepines, use Haldol for delirium.  Note she was recently admitted few weeks ago and had severe delirium at that time as well.  He is to have very poor baseline functional status and long-term prognosis remains guarded.  Explained to daughter and husband in detail.  Advance activity, PT OT eval.  Depression: - Hold all home oral medications until mental status improves  HTN (hypertension):  - IV hydralazine PRN  GERD  - IV pepcid  Iron deficiency anemia:  - Received IV iron this admission, continue oral iron supplementation upon discharge.  Defer further work-up to PCP as age-appropriate.   CKD (chronic kidney disease), stage IV ([place]): recent cre 1.6 on 11/8-->1.43 today. BUN 27.   C. difficile diarrhea: no diarrhea currently - Resolved, will try to minimize antibiotic exposure.  Stop Rocephin on 12/02/2017.  Hypokalemia and hypomagnesemia- both replaced will continue to monitor.  Sacral decubitus ulcer present on admission.  Wound care nurse.  Kindly review RN notes for details.    DM2 with renal manifestations ([place]): Last A1c 6.7 on 05/24/17, well controled.  Currently on Lantus and sliding scale continue to monitor and adjust.    CBG (last 3)  Recent Labs    12/02/17 2119 12/03/17 0829 12/03/17 1231  GLUCAP 229* 189* 219*      Code Status  : DNR  Family Communication  : I discussed with daughter over the phone in detail on 12/01/2017 and again 12/03/17, husband bedside 12/03/17  Disposition Plan  : likely HHPT  Consults  :  PCCM, neurology  Procedures  :    TTE -  Left ventricle: The cavity size was normal. Wall thickness was increased in a pattern of mild LVH. Systolic function was vigorous. The estimated ejection fraction was in the range of 65% to 70%. Wall motion was normal; there were no regional wall motion abnormalities. Doppler parameters are consistent with abnormal left ventricular relaxation (grade 1 diastolic  dysfunction). Doppler parameters are consistent with high ventricular filling pressure. - Ventricular septum: Septal motion showed abnormal function and dyssynergy. - Aortic valve: Peak velocity (S): 205 cm/s. - Mitral valve: Moderately calcified annulus. - Left atrium: The atrium was moderately dilated.  DVT Prophylaxis  :  Heparin  Lab Results  Component Value Date   PLT 90 (L) 12/02/2017    Antibiotics  :    Anti-infectives (From admission, onward)   Start     Dose/Rate Route Frequency Ordered Stop   12/01/17 1000  cefTRIAXone (ROCEPHIN) 1 g in sodium chloride 0.9 % 100 mL IVPB     1 g 200 mL/hr over 30 Minutes Intravenous Every 24 hours 12/01/17 0951 12/02/17 1130   11/30/17 1700  cefTRIAXone (ROCEPHIN) 2 g in sodium chloride 0.9 % 100 mL IVPB  Status:  Discontinued     2 g 200 mL/hr over 30 Minutes Intravenous Every 24 hours 11/30/17 1148 12/01/17 0941   11/29/17 2200  vancomycin (VANCOCIN) 1,500 mg in sodium chloride 0.9 % 500 mL IVPB  Status:  Discontinued     1,500 mg 250 mL/hr over 120 Minutes Intravenous Every 48 hours 11/28/17 1418 11/29/17 1242   11/28/17 1800  ceFEPIme (MAXIPIME) 1 g in sodium chloride 0.9 % 100 mL IVPB  Status:  Discontinued     1 g 200 mL/hr over 30 Minutes Intravenous Every 24 hours 11/28/17 1418 11/30/17 1148  11/28/17 0000  vancomycin (VANCOCIN) 1,250 mg in  sodium chloride 0.9 % 250 mL IVPB  Status:  Discontinued     1,250 mg 166.7 mL/hr over 90 Minutes Intravenous Every 24 hours 11/27/17 0159 11/28/17 1418   11/27/17 1800  ceFEPIme (MAXIPIME) 2 g in sodium chloride 0.9 % 100 mL IVPB  Status:  Discontinued     2 g 200 mL/hr over 30 Minutes Intravenous Every 24 hours 11/27/17 1502 11/28/17 1418   11/27/17 0200  metroNIDAZOLE (FLAGYL) IVPB 500 mg  Status:  Discontinued     500 mg 100 mL/hr over 60 Minutes Intravenous Every 8 hours 11/27/17 0126 12/01/17 0941   11/27/17 0200  aztreonam (AZACTAM) 1 g in sodium chloride 0.9 % 100 mL IVPB  Status:  Discontinued     1 g 200 mL/hr over 30 Minutes Intravenous Every 8 hours 11/27/17 0159 11/27/17 1502   11/27/17 0200  vancomycin (VANCOCIN) 2,000 mg in sodium chloride 0.9 % 500 mL IVPB     2,000 mg 250 mL/hr over 120 Minutes Intravenous  Once 11/27/17 0159 11/27/17 0606   11/26/17 2200  cefTRIAXone (ROCEPHIN) 2 g in sodium chloride 0.9 % 100 mL IVPB  Status:  Discontinued     2 g 200 mL/hr over 30 Minutes Intravenous Every 24 hours 11/26/17 2140 11/27/17 0125        Objective:   Vitals:   12/03/17 0500 12/03/17 0507 12/03/17 0555 12/03/17 0802  BP:  (!) 108/40 (!) 128/46 (!) 126/39  Pulse:  70 70 75  Resp:  19    Temp:  97.8 F (36.6 C)    TempSrc:      SpO2:  (!) 87% 98% 98%  Weight: 105 kg     Height:        Wt Readings from Last 3 Encounters:  12/03/17 105 kg  11/22/17 109.8 kg  11/19/17 109.8 kg     Intake/Output Summary (Last 24 hours) at 12/03/2017 1242 Last data filed at 12/02/2017 2016 Gross per 24 hour  Intake 160 ml  Output 1500 ml  Net -1340 ml     Physical Exam  Awake remains pleasantly confused, no focal deficits, Foley in place [place].AT,PERRAL Supple Neck,No JVD, No cervical lymphadenopathy appriciated.  Symmetrical Chest wall movement, Good air movement bilaterally, CTAB RRR,No Gallops, Rubs or new Murmurs, No Parasternal Heave +ve B.Sounds, Abd Soft, No  tenderness, No organomegaly appriciated, No rebound - guarding or rigidity. No Cyanosis, Clubbing or edema, No new Rash or bruise    Data Review:    CBC Recent Labs  Lab 11/26/17 2152  11/28/17 0327 11/29/17 0256 11/30/17 0401 12/01/17 0448 12/02/17 0431  WBC 7.2   < > 4.4 4.5 3.4* 3.5* 3.9*  HGB 10.2*   < > 7.6* 8.0* 8.1* 7.7* 7.9*  HCT 33.4*   < > 25.9* 27.0* 27.0* 26.1* 26.9*  PLT 160   < > 107* 104* 100* 109* 90*  MCV 73.1*   < > 74.4* 74.8* 73.8* 73.9* 72.7*  MCH 22.3*   < > 21.8* 22.2* 22.1* 21.8* 21.4*  MCHC 30.5   < > 29.3* 29.6* 30.0 29.5* 29.4*  RDW 20.0*   < > 20.6* 20.7* 20.2* 20.2* 19.9*  LYMPHSABS 0.6*  --   --   --   --   --   --   MONOABS 0.2  --   --   --   --   --   --   EOSABS 0.0  --   --   --   --   --   --  BASOSABS 0.0  --   --   --   --   --   --    < > = values in this interval not displayed.    Chemistries  Recent Labs  Lab 11/26/17 2152  11/29/17 0256 11/30/17 0401 12/01/17 0448 12/02/17 0431 12/03/17 0444  NA 138   < > 144 149* 144 141 140  K 4.2   < > 3.7 3.4* 3.2* 3.2* 3.4*  CL 96*   < > 111 114* 106 105 103  CO2 30   < > 26 27 29 27 29   GLUCOSE 300*   < > 137* 121* 225* 204* 199*  BUN 27*   < > 37* 32* 28* 23 21  CREATININE 1.43*   < > 1.56* 1.53* 1.39* 1.22* 1.21*  CALCIUM 9.3   < > 7.7* 8.3* 8.1* 8.4* 8.4*  MG  --   --   --   --   --  1.4* 1.7  AST 29  --   --   --   --   --   --   ALT 21  --   --   --   --   --   --   ALKPHOS 137*  --   --   --   --   --   --   BILITOT 1.0  --   --   --   --   --   --    < > = values in this interval not displayed.   ------------------------------------------------------------------------------------------------------------------ No results for input(s): CHOL, HDL, LDLCALC, TRIG, CHOLHDL, LDLDIRECT in the last 72 hours.  Lab Results  Component Value Date   HGBA1C 6.7 (H) 05/24/2017    ------------------------------------------------------------------------------------------------------------------ No results for input(s): TSH, T4TOTAL, T3FREE, THYROIDAB in the last 72 hours.  Invalid input(s): FREET3 ------------------------------------------------------------------------------------------------------------------ No results for input(s): VITAMINB12, FOLATE, FERRITIN, TIBC, IRON, RETICCTPCT in the last 72 hours.  Coagulation profile Recent Labs  Lab 11/27/17 0327  INR 1.20    No results for input(s): DDIMER in the last 72 hours.  Cardiac Enzymes No results for input(s): CKMB, TROPONINI, MYOGLOBIN in the last 168 hours.  Invalid input(s): CK ------------------------------------------------------------------------------------------------------------------    Component Value Date/Time   BNP 859.9 (H) 11/27/2017 0327   BNP 18.5 08/14/2012 0930    Inpatient Medications  Scheduled Meds: . chlorhexidine  15 mL Mouth Rinse BID  . famotidine  20 mg Oral QHS  . furosemide  60 mg Oral Daily  . heparin  5,000 Units Subcutaneous Q8H  . insulin aspart  0-5 Units Subcutaneous QHS  . insulin aspart  0-9 Units Subcutaneous TID WC  . insulin glargine  7 Units Subcutaneous QHS  . levETIRAcetam  1,000 mg Oral BID  . mouth rinse  15 mL Mouth Rinse q12n4p   Continuous Infusions: . sodium chloride Stopped (12/01/17 1519)  . potassium chloride     PRN Meds:.sodium chloride, acetaminophen **OR** [DISCONTINUED] acetaminophen, haloperidol lactate, hydrALAZINE, [DISCONTINUED] ondansetron **OR** ondansetron (ZOFRAN) IV  Micro Results Recent Results (from the past 240 hour(s))  Blood Culture (routine x 2)     Status: None   Collection Time: 11/26/17  9:52 PM  Result Value Ref Range Status   Specimen Description BLOOD RIGHT HAND  Final   Special Requests   Final    BOTTLES DRAWN AEROBIC AND ANAEROBIC Blood Culture adequate volume   Culture   Final    NO GROWTH 5  DAYS Performed at [facility], [address] [place], [place] [address]    Report Status 12/01/2017 FINAL  Final  Urine culture     Status: None   Collection Time: 11/26/17 10:37 PM  Result Value Ref Range Status   Specimen Description URINE, CATHETERIZED  Final   Special Requests NONE  Final   Culture   Final    NO GROWTH Performed at [facility], [place] [address], [place], [place] [address]    Report Status 11/28/2017 FINAL  Final  Blood Culture (routine x 2)     Status: None   Collection Time: 11/26/17 11:39 PM  Result Value Ref Range Status   Specimen Description BLOOD LEFT FOREARM  Final   Special Requests   Final    BOTTLES DRAWN AEROBIC AND ANAEROBIC Blood Culture adequate volume   Culture   Final    NO GROWTH 5 DAYS Performed at [facility], [place] [address], [place], [place] [address]    Report Status 12/02/2017 FINAL  Final  Respiratory Panel by PCR     Status: None   Collection Time: 11/27/17  1:38 PM  Result Value Ref Range Status   Adenovirus NOT DETECTED NOT DETECTED Final   Coronavirus 229E NOT DETECTED NOT DETECTED Final   Coronavirus HKU1 NOT DETECTED NOT DETECTED Final   Coronavirus NL63 NOT DETECTED NOT DETECTED Final   Coronavirus OC43 NOT DETECTED NOT DETECTED Final   Metapneumovirus NOT DETECTED NOT DETECTED Final   Rhinovirus / Enterovirus NOT DETECTED NOT DETECTED Final   Influenza A NOT DETECTED NOT DETECTED Final   Influenza B NOT DETECTED NOT DETECTED Final   Parainfluenza Virus 1 NOT DETECTED NOT DETECTED Final   Parainfluenza Virus 2 NOT DETECTED NOT DETECTED Final   Parainfluenza Virus 3 NOT DETECTED NOT DETECTED Final   Parainfluenza Virus 4 NOT DETECTED NOT DETECTED Final   Respiratory Syncytial Virus NOT DETECTED NOT DETECTED Final   Bordetella pertussis NOT DETECTED NOT DETECTED Final   Chlamydophila pneumoniae NOT DETECTED NOT DETECTED Final   Mycoplasma pneumoniae NOT DETECTED NOT DETECTED Final    Comment: Performed  at [facility], [place] [address], [place], [place] [address]    Radiology Reports Ct Abdomen Pelvis Wo Contrast  Result Date: 11/27/2017 CLINICAL DATA:  Abdominal distention, altered mental status, sepsis. History of Clostridium difficile colitis, open wound of the abdominal wall, nephrolithiasis, hernia, gastroesophageal reflux disease, diverticulitis, colon polyp, cellulitis and abscess, adrenal adenoma, ventral hernia repair, hysterectomy, sigmoid resection, cholecystectomy, appendectomy. EXAM: CT ABDOMEN AND PELVIS WITHOUT CONTRAST TECHNIQUE: Multidetector CT imaging of the abdomen and pelvis was performed following the standard protocol without IV contrast. COMPARISON:  10/30/2017 FINDINGS: Lower chest: Evaluation is limited due to motion artifact. Atelectasis or consolidation in the lung bases, greater on the left. Coronary artery calcifications. Calcification in the mitral valve annulus. Hepatobiliary: No focal liver abnormality is seen. Status post cholecystectomy. No biliary dilatation. Pancreas: Fatty infiltration of the pancreas. Spleen: Normal in size without focal abnormality. Adrenals/Urinary Tract: 3.1 cm diameter left adrenal gland nodule. No change since previous study. Fat attenuation consistent with benign adenoma. Bilateral renal parenchymal atrophy. Mild left hydronephrosis and hydroureter with tiny punctate stones suggested in the distal left ureter. Bladder wall is not thickened. Gas in the bladder may come from instrumentation or infection. Exophytic lesion arising from the right kidney without change since prior study. Hyperdensity likely indicating a hemorrhagic cyst. Stomach/Bowel: Stomach, small bowel, and colon are not abnormally distended. Scattered stool throughout the colon. Scattered  colonic diverticula. No evidence of diverticulitis. Prominent stool in the rectum with mild rectal wall thickening may indicate stercoral colitis. Infiltration in the presacral fat is  unchanged since prior study, possibly inflammatory or reactive. Vascular/Lymphatic: Aortic atherosclerosis. No enlarged abdominal or pelvic lymph nodes. Reproductive: Status post hysterectomy. No adnexal masses. Other: Small amount of free fluid around the liver and spleen. Likely ascites. Scarring in the anterior abdominal wall consistent with postoperative change. Edema in the subcutaneous fat. No free air in the abdomen. Musculoskeletal: Degenerative changes in the spine. No destructive bone lesions. IMPRESSION: 1. Atelectasis or consolidation in the lung bases, greater on the left. 2. Left adrenal gland adenoma. 3. Bilateral renal parenchymal atrophy. Punctate stones suggested in the distal left ureter. Mild proximal hydronephrosis and hydroureter. Gas in the bladder may be due to instrumentation or infection. 4. Prominent stool in the rectum with rectal wall thickening suggesting possible stercoral colitis. 5. Small amount of free fluid in the abdomen and pelvis, likely ascites. 6. Probable hemorrhagic cysts in the right kidney. Aortic Atherosclerosis (ICD10-I70.0). Electronically Signed   By: Lucienne Capers M.D.   On: 11/27/2017 03:10   Dg Abd 1 View  Result Date: 11/09/2017 CLINICAL DATA:  Patient pulled out nasogastric catheter EXAM: ABDOMEN - 1 VIEW COMPARISON:  11/07/2017 FINDINGS: Scattered large and small bowel gas is noted. The previously seen nasogastric catheter has been removed in the interval. Despite the given clinical history of metal tipped catheter this catheter on previous images represented a standard nasogastric catheter without metallic weighting. No foreign body is noted. IMPRESSION: No evidence of retained foreign body as the prior nasogastric catheter was not a weighted tipped catheter based on previous images. Electronically Signed   By: Inez Catalina M.D.   On: 11/09/2017 19:57   Dg Abd 1 View  Result Date: 11/07/2017 CLINICAL DATA:  84 year old female with feeding tube  placement. EXAM: ABDOMEN - 1 VIEW COMPARISON:  Earlier abdominal radiograph dated 11/07/2017 FINDINGS: No significant change in the appearance or positioning of the enteric tube which appears somewhat folded, likely in the mid to distal stomach. IMPRESSION: No significant interval change. Electronically Signed   By: Anner Crete M.D.   On: 11/07/2017 23:00   Ct Head Wo Contrast  Result Date: 11/27/2017 CLINICAL DATA:  Altered mental status. History of hypertension, hepatitis, diabetes, edema. EXAM: CT HEAD WITHOUT CONTRAST TECHNIQUE: Contiguous axial images were obtained from the base of the skull through the vertex without intravenous contrast. COMPARISON:  MRI brain 11/29/2016. CT head 11/24/2016. FINDINGS: Brain: Diffuse cerebral atrophy. Ventricular dilatation consistent with central atrophy. Low-attenuation changes throughout the deep white matter consistent with small vessel ischemia. No mass-effect or midline shift. No abnormal extra-axial fluid collections. Gray-white matter junctions are distinct. Basal cisterns are not effaced. No acute intracranial hemorrhage. Vascular: Moderate intracranial arterial vascular calcifications are present. Skull: Calvarium appears intact. Sinuses/Orbits: Paranasal sinuses and mastoid air cells are clear. Other: None. IMPRESSION: No acute intracranial abnormalities. Chronic atrophy and small vessel ischemic changes. Electronically Signed   By: Lucienne Capers M.D.   On: 11/27/2017 03:02   Mr Brain Wo Contrast  Result Date: 11/30/2017 CLINICAL DATA:  Initial evaluation for acute altered mental status, seizure, unclear cause. EXAM: MRI HEAD WITHOUT CONTRAST TECHNIQUE: Multiplanar, multiecho pulse sequences of the brain and surrounding structures were obtained without intravenous contrast. COMPARISON:  Prior CT from 11/27/2017. FINDINGS: Brain: Diffuse prominence of the CSF containing spaces compatible with generalized age-related cerebral atrophy, relatively  stable in appearance as compared  to most recent MRI from 11/29/2016. There has been interval development of extensive T2/FLAIR signal abnormality throughout the supratentorial and infratentorial cerebral white matter, involving both cerebral hemispheres in a fairly symmetric pattern. Slight posterior predominance involving the parieto-occipital regions noted. Involvement of both internal and external capsules. Patchy infratentorial involvement involving the bilateral cerebellar hemispheres. Suggestion of associated mild edema as a few cortical gyri appear slightly expanded as compared to previous exam (series 10, image 20). Sparing of the overlying cortical gray matter which is relatively normal in appearance. Findings are nonspecific. Superimposed chronic microvascular ischemic changes noted within the pons, otherwise similar to previous. No abnormal foci of restricted diffusion to suggest acute or subacute ischemia. No encephalomalacia to suggest chronic cortical infarction. No foci of susceptibility artifact to suggest acute or chronic intracranial hemorrhage. No mass lesion, midline shift or mass effect. Stable ventricular prominence related global parenchymal volume loss without hydrocephalus. No extra-axial fluid collection. Pituitary gland normal. Midline structures intact. Vascular: Major intracranial vascular flow voids are maintained. Skull and upper cervical spine: Craniocervical junction within normal limits. No focal marrow replacing lesion. Scalp soft tissues unremarkable. Sinuses/Orbits: Patient status post ocular lens replacement bilaterally. Paranasal sinuses are clear. Small right mastoid effusion noted. Other: None. IMPRESSION: Extensive abnormal T2/FLAIR signal abnormality throughout the cerebral white matter as above, new relative to most recent MRI from 11/29/2016. Findings are nonspecific, with primary differential considerations including PRES versus toxic/metabolic derangement. Anoxic brain  injury felt to be unlikely given the relative sparing of the gray matter structures. Correlation with laboratory values and history recommended. Electronically Signed   By: Jeannine Boga M.D.   On: 11/30/2017 14:18   Ir Fluoro Guide Cv Line Right  Result Date: 11/03/2017 INDICATION: End-stage renal disease. In need intravenous access for the initiation of hemodialysis. EXAM: NON-TUNNELED CENTRAL VENOUS HEMODIALYSIS CATHETER PLACEMENT WITH ULTRASOUND AND FLUOROSCOPIC GUIDANCE COMPARISON:  Chest radiograph - 10/31/2017 MEDICATIONS: None FLUOROSCOPY TIME:  24 seconds (14 mGy) COMPLICATIONS: None immediate. PROCEDURE: Informed written consent was obtained from patient's family after a discussion of the risks, benefits, and alternatives to treatment. Questions regarding the procedure were encouraged and answered. The right neck and chest were prepped with chlorhexidine in a sterile fashion, and a sterile drape was applied covering the operative field. Maximum barrier sterile technique with sterile gowns and gloves were used for the procedure. A timeout was performed prior to the initiation of the procedure. After the overlying soft tissues were anesthetized, a small venotomy incision was created and a micropuncture kit was utilized to access the internal jugular vein. Real-time ultrasound guidance was utilized for vascular access including the acquisition of a permanent ultrasound image documenting patency of the accessed vessel. The microwire was utilized to measure appropriate catheter length. A stiff glidewire was advanced to the level of the IVC. Under fluoroscopic guidance, the venotomy was serially dilated, ultimately allowing placement of a 20 cm temporary Trialysis catheter with tip ultimately terminating within the superior aspect of the right atrium. Final catheter positioning was confirmed and documented with a spot radiographic image. The catheter aspirates and flushes normally. The catheter was  flushed with appropriate volume heparin dwells. The catheter exit site was secured with a 0-Prolene retention suture. A dressing was placed. The patient tolerated the procedure well without immediate post procedural complication. IMPRESSION: Successful placement of a right internal jugular approach 20 cm temporary dialysis catheter with tip terminating with in the superior aspect of the right atrium. The catheter is ready for immediate use. PLAN: This  catheter may be converted to a tunneled dialysis catheter at a later date as indicated. Electronically Signed   By: Sandi Mariscal M.D.   On: 11/03/2017 18:01   Ir US Guide Vasc Access Right  Result Date: 11/03/2017 INDICATION: End-stage renal disease. In need intravenous access for the initiation of hemodialysis. EXAM: NON-TUNNELED CENTRAL VENOUS HEMODIALYSIS CATHETER PLACEMENT WITH ULTRASOUND AND FLUOROSCOPIC GUIDANCE COMPARISON:  Chest radiograph - 10/31/2017 MEDICATIONS: None FLUOROSCOPY TIME:  24 seconds (14 mGy) COMPLICATIONS: None immediate. PROCEDURE: Informed written consent was obtained from patient's family after a discussion of the risks, benefits, and alternatives to treatment. Questions regarding the procedure were encouraged and answered. The right neck and chest were prepped with chlorhexidine in a sterile fashion, and a sterile drape was applied covering the operative field. Maximum barrier sterile technique with sterile gowns and gloves were used for the procedure. A timeout was performed prior to the initiation of the procedure. After the overlying soft tissues were anesthetized, a small venotomy incision was created and a micropuncture kit was utilized to access the internal jugular vein. Real-time ultrasound guidance was utilized for vascular access including the acquisition of a permanent ultrasound image documenting patency of the accessed vessel. The microwire was utilized to measure appropriate catheter length. A stiff glidewire was advanced  to the level of the IVC. Under fluoroscopic guidance, the venotomy was serially dilated, ultimately allowing placement of a 20 cm temporary Trialysis catheter with tip ultimately terminating within the superior aspect of the right atrium. Final catheter positioning was confirmed and documented with a spot radiographic image. The catheter aspirates and flushes normally. The catheter was flushed with appropriate volume heparin dwells. The catheter exit site was secured with a 0-Prolene retention suture. A dressing was placed. The patient tolerated the procedure well without immediate post procedural complication. IMPRESSION: Successful placement of a right internal jugular approach 20 cm temporary dialysis catheter with tip terminating with in the superior aspect of the right atrium. The catheter is ready for immediate use. PLAN: This catheter may be converted to a tunneled dialysis catheter at a later date as indicated. Electronically Signed   By: Sandi Mariscal M.D.   On: 11/03/2017 18:01   Dg Chest Port 1 View  Result Date: 12/02/2017 CLINICAL DATA:  Shortness of breath EXAM: PORTABLE CHEST 1 VIEW COMPARISON:  11/30/2017 and prior exams FINDINGS: Cardiomegaly and pulmonary vascular congestion again noted. LEFT LOWER lung consolidation/atelectasis and mild RIGHT basilar atelectasis again noted. No pneumothorax. There has been little interval change since prior studies IMPRESSION: Unchanged appearance of the chest with continued LEFT LOWER lung consolidation/atelectasis and mild RIGHT basilar atelectasis. Electronically Signed   By: Margarette Canada M.D.   On: 12/02/2017 08:12   Dg Chest Port 1 View  Result Date: 11/30/2017 CLINICAL DATA:  Shortness of breath, hypertension EXAM: PORTABLE CHEST 1 VIEW COMPARISON:  11/28/2017 FINDINGS: Cardiomegaly with vascular congestion and probable mild pulmonary edema. Findings are similar prior study. Suspect small layering effusions. No acute bony abnormality. IMPRESSION:  Cardiomegaly with mild pulmonary edema and small layering effusions. No real change since prior study. Electronically Signed   By: Rolm Baptise M.D.   On: 11/30/2017 08:54   Dg Chest Port 1 View  Result Date: 11/28/2017 CLINICAL DATA:  Hypoxia. EXAM: PORTABLE CHEST 1 VIEW COMPARISON:  Radiograph November 26, 2017. FINDINGS: Stable cardiomegaly is noted with central pulmonary vascular congestion. No pneumothorax or pleural effusion is noted. Possible mild bilateral pulmonary edema may be present. Bony thorax is unremarkable. IMPRESSION:  Stable cardiomegaly with central pulmonary vascular congestion and possible mild bilateral pulmonary edema. Electronically Signed   By: Marijo Conception, M.D.   On: 11/28/2017 12:55   Dg Chest Port 1 View  Result Date: 11/26/2017 CLINICAL DATA:  Altered mental status EXAM: PORTABLE CHEST 1 VIEW COMPARISON:  11/13/2017 FINDINGS: AP portable semi upright view of the chest. The patient is rotated to the right. Stable cardiomegaly with interstitial edema. No pulmonary confluence, effusion or pneumothorax. No acute osseous abnormality. IMPRESSION: Cardiomegaly with interstitial edema.  No significant change. Electronically Signed   By: Ashley Royalty M.D.   On: 11/26/2017 22:48   Dg Chest Port 1 View  Result Date: 11/13/2017 CLINICAL DATA:  Shortness of breath EXAM: PORTABLE CHEST 1 VIEW COMPARISON:  11/09/2017 FINDINGS: Cardiomegaly and slightly increased pulmonary vascular congestion noted. Mild bibasilar opacities/atelectasis noted in this low volume film. A RIGHT central venous catheter with tips overlying the LOWER SVC/SUPERIOR cavoatrial junction noted. No pneumothorax. IMPRESSION: Cardiomegaly with pulmonary vascular congestion and mild bibasilar opacities/atelectasis. Electronically Signed   By: Margarette Canada M.D.   On: 11/13/2017 07:49   Dg Chest Port 1 View  Result Date: 11/09/2017 CLINICAL DATA:  Patient removed recent nasogastric catheter EXAM: PORTABLE CHEST 1 VIEW  COMPARISON:  11/08/2017 FINDINGS: Cardiac shadow is enlarged. Right jugular central line is again seen and stable. Inspiratory effort is poor with crowding of the vascular markings. No focal confluent infiltrate is seen. No radiopaque foreign body is noted. The previously seen nasogastric catheter has been removed and as previously described was not a weighted tipped catheter. IMPRESSION: Poor inspiratory effort.  No acute abnormality noted. Electronically Signed   By: Inez Catalina M.D.   On: 11/09/2017 19:58   Dg Chest Port 1 View  Result Date: 11/08/2017 CLINICAL DATA:  Respiratory failure. EXAM: PORTABLE CHEST 1 VIEW COMPARISON:  11/07/2017 and older exams. FINDINGS: Mild enlargement of the cardiopericardial silhouette, stable. Prominent bronchovascular markings, with additional lung base opacity, the latter consistent with atelectasis, also without change from the previous day's exam. New nasal/orogastric tube passes below the included field of view, likely into the stomach. Right subclavian dual lumen central venous catheter is stable, tip in the right atrium. No pneumothorax. IMPRESSION: 1. No significant change in lung aeration from the previous day's study. There are prominent bronchovascular markings and lung base atelectasis, but no convincing pulmonary edema or pneumonia. 2. New nasal/orogastric tube. Tip not visualized, but likely extends into the stomach. Electronically Signed   By: Lajean Manes M.D.   On: 11/08/2017 07:13   Dg Chest Port 1 View  Result Date: 11/07/2017 CLINICAL DATA:  Acute respiratory failure with hypoxia. EXAM: PORTABLE CHEST 1 VIEW COMPARISON:  10/31/2017. FINDINGS: Cardiomegaly. Improved lung volumes. Increased perihilar markings could represent early edema or viral pneumonitis. Other than improved lung markings, similar appearance to priors. Dialysis catheter has been inserted via RIGHT IJ approach. Tip slide in the RIGHT atrium. There is no pneumothorax. IMPRESSION:  Satisfactory catheter placement. No significant change in the appearance of the lungs except for improved volume. Cardiomegaly. Electronically Signed   By: Staci Righter M.D.   On: 11/07/2017 11:30   Dg Abd Portable 1v  Result Date: 11/07/2017 CLINICAL DATA:  Nasogastric tube placement. EXAM: PORTABLE ABDOMEN - 1 VIEW COMPARISON:  11/03/2017 FINDINGS: Interval nasogastric tube folded back upon itself in the mid to distal stomach with its tip in the mid stomach. The included bowel gas pattern is normal. Prominent interstitial markings at the left lung  base. Lower thoracic spine degenerative changes. IMPRESSION: Nasogastric tube folded back upon itself in the mid to distal stomach with its tip in the mid stomach. Electronically Signed   By: Claudie Revering M.D.   On: 11/07/2017 13:13     Signature  Lala Lund M.D on 12/03/2017 at 12:42 PM   -  To page go to [email or web address] - password [facility]

## 2017-12-03 NOTE — Progress Notes (Addendum)
Tx performed and charted by SPTA under direct guidance and supervision of PTA at all times. This session was performed under the supervision of a licensed clinician.   Charting reviewed for accuracy and depicts tx performed and services provided.  Governor Rooks, PTA Acute Rehabilitation Services Pager [phone] Office [phone]  Physical Therapy Treatment Patient Details Name: Lauren Crosby MRN: [id number] DOB: [birth year] Today's Date: 12/03/2017    History of Present Illness Patient is a 84 yo female who presents with AMS and fever; pt with seizure in hospital with decompensated respiratory failure requiring BiPAP. Recently admitted 10/22-11/8 due to septic shock. PMH includes DM, CKD, HTN, HLD, morbid obesity, gout.    PT Comments    Patient's tolerance to treatment today was fair.  Patient was in bed with husband present upon PT arrival.  Patient was not motivated to get out of bed today and required increased time for education.  Patient was transferred to chair via stedy today requiring VC for hand placement but was able to perform sit to stand x2.  Patient became increasingly agitated throughout session particularly while therapist repositioned pt in chair complaining of pain in B LE.  Patient would continue to benefit from acute care PT in order to address strength and mobility deficits.  Patient continues to be a good candidate for SNF placement based on current functional status.  At this time, pt and spouse refusing SNF placement and prefer to return home.  Will inform supervising PT of equipment needs for d/c home.   Follow Up Recommendations  SNF;Supervision/Assistance - 24 hour(pt adamant about returning home and will require HHPT for family training and to educate on use of equipment)     [facility] bed;Wheelchair (measurements PT);Wheelchair cushion (measurements PT);3in1 (PT)(hoyer lift and pad, elevating leg rests)    Recommendations for  Other Services OT consult     Precautions / Restrictions Precautions Precautions: Fall Restrictions Weight Bearing Restrictions: No    Mobility  Bed Mobility Overal bed mobility: Needs Assistance Bed Mobility: Supine to Sit     Supine to sit: Mod assist;HOB elevated     General bed mobility comments: pt was able to slowly initiate ROM of B LE towards EOB but required mod A.  Pt required mod A to elevate trunk and scoot to EOB with therapist facilitating scooting with bed pad.  Transfers Overall transfer level: Needs assistance   Transfers: Sit to/from Stand Sit to Stand: Mod assist;+2 physical assistance;From elevated surface         General transfer comment: Pt required mod A +2 to stand from elevated bed surface into stedy with therapist providing facilitation through bed pad.  Patient required VC for hand placement on stedy bar to pull into standing.  Patient performed second sit to stand from chair without therapist using bed pad for assistance.  Ambulation/Gait                 Stairs             Wheelchair Mobility    Modified Rankin (Stroke Patients Only)       Balance Overall balance assessment: Needs assistance Sitting-balance support: Feet supported;No upper extremity supported Sitting balance-Leahy Scale: Good Sitting balance - Comments: Pt able to sit EOB with hands on bed surface for support but required VC to sit upright with noted R lateral lean towards HOB. Postural control: Right lateral lean Standing balance support: During functional activity;Bilateral upper extremity supported Standing balance-Leahy Scale: Poor Standing balance  comment: Pt required B UE support from stedy while standing.                            Cognition Arousal/Alertness: Awake/alert Behavior During Therapy: Agitated Overall Cognitive Status: Impaired/Different from baseline Area of Impairment: Safety/judgement;Awareness;Following commands                        Following Commands: Follows one step commands with increased time;Follows one step commands inconsistently Safety/Judgement: Decreased awareness of safety;Decreased awareness of deficits Awareness: Intellectual Problem Solving: Slow processing;Decreased initiation;Requires verbal cues;Requires tactile cues General Comments: Pt very agitated with movement during this session with moaning, groaning, and yelling directed at therapist.      Exercises      General Comments        Pertinent Vitals/Pain Pain Assessment: Faces Faces Pain Scale: Hurts little more Pain Location: B legs Pain Descriptors / Indicators: Discomfort;Moaning;Constant;Grimacing;Crying Pain Intervention(s): Limited activity within patient's tolerance;Monitored during session    Home Living                      Prior Function            PT Goals (current goals can now be found in the care plan section) Acute Rehab PT Goals Patient Stated Goal: to return home PT Goal Formulation: With patient Time For Goal Achievement: 12/15/17 Potential to Achieve Goals: Fair Progress towards PT goals: Progressing toward goals    Frequency    Min 2X/week      PT Plan Current plan remains appropriate    Co-evaluation              AM-PAC PT "6 Clicks" Mobility   Outcome Measure  Help needed turning from your back to your side while in a flat bed without using bedrails?: A Lot Help needed moving from lying on your back to sitting on the side of a flat bed without using bedrails?: A Lot Help needed moving to and from a bed to a chair (including a wheelchair)?: Total Help needed standing up from a chair using your arms (e.g., wheelchair or bedside chair)?: A Lot Help needed to walk in hospital room?: Total Help needed climbing 3-5 steps with a railing? : Total 6 Click Score: 9    End of Session   Activity Tolerance: Patient limited by pain;Treatment limited secondary to  agitation Patient left: in chair;with call bell/phone within reach;with chair alarm set;with family/visitor present Nurse Communication: Mobility status;Need for lift equipment(stedy) PT Visit Diagnosis: Unsteadiness on feet (R26.81);Other abnormalities of gait and mobility (R26.89);Muscle weakness (generalized) (M62.81)     Time: 3875-6433 PT Time Calculation (min) (ACUTE ONLY): 25 min  Charges:  $Therapeutic Activity: 23-37 mins                     [address] SPTA   Megan Branch 12/03/2017, 1:30 PM

## 2017-12-03 NOTE — NC FL2 (Signed)
[place] LEVEL OF CARE SCREENING TOOL     IDENTIFICATION  Patient Name: Lauren Crosby Birthdate: Apr 20, 1931 Sex: female Admission Date (Current Location): 11/26/2017  [facility] and [place] Number:  Herbalist and Address:  [facility], [place] [address], [place], [place] [address]      Provider Number: [id number]  Attending Physician Name and Address:  Thurnell Lose, MD  Relative Name and Phone Number:  Elenore Rota, spouse, [phone]    Current Level of Care: Hospital Recommended Level of Care: [place] Prior Approval Number:    Date Approved/Denied:   PASRR Number: [id number]  Discharge Plan: SNF    Current Diagnoses: Patient Active Problem List   Diagnosis Date Noted  . Pressure injury of skin 11/30/2017  . Type II diabetes mellitus with renal manifestations ([place]) 11/27/2017  . Sepsis ([place]) 11/23/2017  . C. difficile diarrhea 11/23/2017  . Acute respiratory failure with hypoxemia ([place])   . Acute pulmonary edema (HCC)   . Acute metabolic encephalopathy   . Palliative care by specialist   . Goals of care, counseling/discussion   . Hyperglycemia due to type 2 diabetes mellitus ([place]) 11/04/2017  . Encephalopathy acute 11/04/2017  . Shock circulatory ([place]) 11/03/2017  . Colitis, acute 10/31/2017  . ATN (acute tubular necrosis) ([place]) 10/31/2017  . Diarrhea 10/30/2017  . CKD (chronic kidney disease), stage IV ([place]) 10/30/2017  . Pyelitis: Mild per CT 05/24/2017 05/25/2017  . Acidemia   . Abdominal pain 05/24/2017  . Vaginal candidiasis 05/24/2017  . Dermatitis associated with moisture 05/24/2017  . Ileus [facility]): Colonic 05/24/2017  . Acute lower UTI 05/23/2017  . Iron deficiency anemia 05/23/2017  . Pressure injury of buttock, stage 2 ([place]) 05/23/2017  . Acute on chronic anemia   . Hyperkalemia   . Hydronephrosis   . Acute kidney failure ([place]) 05/22/2017  . Metabolic acidosis, normal anion gap  (NAG) 05/22/2017  . Hyperkalemia, diminished renal excretion 05/22/2017  . Rhinovirus infection   . AKI (acute kidney injury) ([place])   . HCAP (healthcare-associated pneumonia) 01/12/2017  . ARF (acute renal failure) ([place]) 01/12/2017  . Shortness of breath   . Retinopathy due to secondary diabetes mellitus ([place])   . Pneumonia   . Obesity   . 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 Nephrolithiasis   . Hypertension   . Hyperlipemia   . Hepatitis   . Gout   . Goiter   . GERD (gastroesophageal reflux disease)   . Diverticulitis   . Diabetes ([place])   . Colon polyp   . Cellulitis and abscess of trunk   . Arthritis   . Chronic anemia   . Adrenal adenoma   . Morbid obesity ([place]) 08/13/2013  . Postoperative anemia due to acute blood loss 08/13/2013  . S/P right TKA 08/11/2013  . Preop cardiovascular exam 08/14/2012  . Chest pain 08/14/2012  . Suture granuloma 02/09/2012  . DYSPNEA ON EXERTION 10/04/2009  . Hyperlipidemia associated with type 2 diabetes mellitus ([place]) 04/15/2007  . OBESITY 04/15/2007  . ANEMIA, CHRONIC 04/15/2007  . ANXIETY 04/15/2007  . Depression 04/15/2007  . HTN (hypertension) 04/15/2007  . GERD 04/15/2007  . DEGENERATIVE JOINT DISEASE 04/15/2007  . HELICOBACTER PYLORI INFECTION, HX OF 04/15/2007  . COLONIC POLYPS 10/04/2006  . DIVERTICULOSIS, COLON 10/04/2006  . REFLUX ESOPHAGITIS 09/02/202003  . GASTRITIS, ACUTE 09/02/202003    Orientation RESPIRATION BLADDER Height & Weight     Self, Time, Situation, Place  Normal(May need bipap at night) Continent, Indwelling catheter Weight: 105 kg Height:  5\' 2"  (157.5 cm)  BEHAVIORAL SYMPTOMS/MOOD NEUROLOGICAL BOWEL NUTRITION STATUS      Incontinent Diet(Please DC Summary)  AMBULATORY STATUS COMMUNICATION OF NEEDS Skin   Extensive Assist Verbally Normal                       Personal Care Assistance Level of Assistance  Bathing, Feeding, Dressing Bathing Assistance: Maximum  assistance Feeding assistance: Maximum assistance Dressing Assistance: Maximum assistance     Functional Limitations Info  Sight, Hearing, Speech Sight Info: Adequate Hearing Info: Impaired Speech Info: Adequate    SPECIAL CARE FACTORS FREQUENCY  PT (By licensed PT), OT (By licensed OT)     PT Frequency: 5x/week OT Frequency: 3x/week            Contractures Contractures Info: Not present    Additional Factors Info  Code Status, Allergies, Isolation Precautions, Insulin Sliding Scale Code Status Info: DNR Allergies Info: Allergies:  Penicillins   Insulin Sliding Scale Info: Every 4 hours Isolation Precautions Info: Enteric precautions     Current Medications (12/03/2017):  This is the current hospital active medication list Current Facility-Administered Medications  Medication Dose Route Frequency Provider Last Rate Last Dose  . 0.9 %  sodium chloride infusion   Intravenous PRN Elgergawy, Silver Huguenin, MD   Stopped at 12/01/17 1519  . acetaminophen (TYLENOL) tablet 650 mg  650 mg Oral Q6H PRN Ivor Costa, MD   650 mg at 12/02/17 2203   Or  . acetaminophen (TYLENOL) suppository 650 mg  650 mg Rectal Q6H PRN Ivor Costa, MD   650 mg at 11/29/17 0850  . chlorhexidine (PERIDEX) 0.12 % solution 15 mL  15 mL Mouth Rinse BID Elgergawy, Silver Huguenin, MD   15 mL at 12/02/17 2257  . famotidine (PEPCID) IVPB 20 mg premix  20 mg Intravenous Q12H Ivor Costa, MD 100 mL/hr at 12/02/17 2257 20 mg at 12/02/17 2257  . furosemide (LASIX) injection 60 mg  60 mg Intravenous Q12H Elgergawy, Silver Huguenin, MD   60 mg at 12/02/17 2032  . heparin injection 5,000 Units  5,000 Units Subcutaneous Cleophas Dunker, MD   5,000 Units at 12/03/17 0524  . hydrALAZINE (APRESOLINE) injection 5 mg  5 mg Intravenous Q2H PRN Ivor Costa, MD   5 mg at 11/27/17 1148  . insulin aspart (novoLOG) injection 0-5 Units  0-5 Units Subcutaneous QHS Ivor Costa, MD   2 Units at 12/02/17 2302  . insulin aspart (novoLOG) injection 0-9 Units   0-9 Units Subcutaneous TID WC Ivor Costa, MD   3 Units at 12/02/17 1800  . insulin glargine (LANTUS) injection 7 Units  7 Units Subcutaneous QHS Ivor Costa, MD   7 Units at 12/02/17 2300  . levETIRAcetam (KEPPRA) IVPB 1000 mg/100 mL premix  1,000 mg Intravenous Q12H Cristal Ford, DO 400 mL/hr at 12/03/17 0111 1,000 mg at 12/03/17 0111  . MEDLINE mouth rinse  15 mL Mouth Rinse q12n4p Elgergawy, Silver Huguenin, MD   15 mL at 12/02/17 1200  . ondansetron (ZOFRAN) injection 4 mg  4 mg Intravenous Q6H PRN Ivor Costa, MD      . potassium chloride SA (K-DUR,KLOR-CON) CR tablet 40 mEq  40 mEq Oral Daily Thurnell Lose, MD   40 mEq at 12/02/17 1000     Discharge Medications: Please see discharge summary for a list of discharge medications.  Relevant Imaging Results:  Relevant Lab Results:  Additional Information SSN: [place], [place]

## 2017-12-03 NOTE — Consult Note (Signed)
[place] Nurse wound consult note Patient receiving care in [facility] [phone].  Patient up to chair.  Will have to be back to bed for me to view the sacrum.  Primary RN to page me when the patient is back in bed.  Val Riles, RN, MSN, CWOCN, CNS-BC, pager [phone]

## 2017-12-03 NOTE — Progress Notes (Addendum)
Daily Progress Note   Patient Name: Lauren Crosby       Date: 12/03/2017 DOB: 16-Jun-1931  Age: 84 y.o. MRN#: [id number] Attending Physician: Thurnell Lose, MD Primary Care Physician: [name], MD Admit Date: 11/26/2017  Reason for Consultation/Follow-up: Psychosocial/spiritual support  Subjective: Patient is sitting in bedside chair with legs elevated attempting to reposition in chair. Per staff, PT moved her to the chair with a stand lift.  She is screaming profanity. She complains of leg pain. Her husband explains that her legs look much better as the edema in her legs is improved, but with diuresis, she has had lower extremity pain.    Length of Stay: 6  Current Medications: Scheduled Meds:  . chlorhexidine  15 mL Mouth Rinse BID  . famotidine  20 mg Oral QHS  . furosemide  60 mg Oral Daily  . heparin  5,000 Units Subcutaneous Q8H  . insulin aspart  0-5 Units Subcutaneous QHS  . insulin aspart  0-9 Units Subcutaneous TID WC  . insulin glargine  7 Units Subcutaneous QHS  . levETIRAcetam  1,000 mg Oral BID  . mouth rinse  15 mL Mouth Rinse q12n4p  . tamsulosin  0.4 mg Oral Daily    Continuous Infusions: . sodium chloride Stopped (12/01/17 1519)  . magnesium sulfate 1 - 4 g bolus IVPB    . potassium chloride 10 mEq (12/03/17 1310)    PRN Meds: sodium chloride, acetaminophen **OR** [DISCONTINUED] acetaminophen, cyclobenzaprine, haloperidol lactate, hydrALAZINE, HYDROcodone-acetaminophen, [DISCONTINUED] ondansetron **OR** ondansetron (ZOFRAN) IV  Physical Exam  Constitutional: She appears distressed.  Pulmonary/Chest: Effort normal.  Neurological: She is alert.  Skin:  Legs are erythematous.             Vital Signs: BP (!) 126/39   Pulse 75   Temp 97.8 F  (36.6 C)   Resp 19   Ht 5\' 2"  (1.575 m)   Wt 105 kg   SpO2 98%   BMI 42.34 kg/m  SpO2: SpO2: 98 % O2 Device: O2 Device: Room Air O2 Flow Rate: O2 Flow Rate (L/min): 6 L/min  Intake/output summary:   Intake/Output Summary (Last 24 hours) at 12/03/2017 1311 Last data filed at 12/02/2017 2016 Gross per 24 hour  Intake -  Output 1500 ml  Net -1500 ml   LBM: Last BM  Date: 12/02/17 Baseline Weight: Weight: 104.9 kg Most recent weight: Weight: 105 kg       Palliative Assessment/Data: 30%    Flowsheet Rows     Most Recent Value  Intake Tab  Referral Department  Hospitalist  Unit at Time of Referral  Intermediate Care Unit  Date Notified  11/28/17  Palliative Care Type  Return patient Palliative Care  Reason for referral  Clarify Goals of Care  Date of Admission  11/26/17  Date first seen by Palliative Care  11/29/17  # of days Palliative referral response time  1 Day(s)  # of days IP prior to Palliative referral  2  Clinical Assessment  Pain Max last 24 hours  Not able to report  Pain Min Last 24 hours  Not able to report  Dyspnea Max Last 24 Hours  Not able to report  Dyspnea Min Last 24 hours  Not able to report  Psychosocial & Spiritual Assessment  Palliative Care Outcomes      Patient Active Problem List   Diagnosis Date Noted  . Pressure injury of skin 11/30/2017  . Type II diabetes mellitus with renal manifestations ([place]) 11/27/2017  . Sepsis ([place]) 11/23/2017  . C. difficile diarrhea 11/23/2017  . Acute respiratory failure with hypoxemia ([place])   . Acute pulmonary edema (H[facility])   . Acute metabolic encephalopathy   . Palliative care by specialist   . Goals of care, counseling/discussion   . Hyperglycemia due to type 2 diabetes mellitus ([place]) 11/04/2017  . Encephalopathy acute 11/04/2017  . Shock circulatory ([place]) 11/03/2017  . Colitis, acute 10/31/2017  . ATN (acute tubular necrosis) ([place]) 10/31/2017  . Diarrhea 10/30/2017  . CKD (chronic kidney  disease), stage IV ([place]) 10/30/2017  . Pyelitis: Mild per CT 05/24/2017 05/25/2017  . Acidemia   . Abdominal pain 05/24/2017  . Vaginal candidiasis 05/24/2017  . Dermatitis associated with moisture 05/24/2017  . Ileus [facility]): Colonic 05/24/2017  . Acute lower UTI 05/23/2017  . Iron deficiency anemia 05/23/2017  . Pressure injury of buttock, stage 2 ([place]) 05/23/2017  . Acute on chronic anemia   . Hyperkalemia   . Hydronephrosis   . Acute kidney failure ([place]) 05/22/2017  . Metabolic acidosis, normal anion gap (NAG) 05/22/2017  . Hyperkalemia, diminished renal excretion 05/22/2017  . Rhinovirus infection   . AKI (acute kidney injury) ([place])   . HCAP (healthcare-associated pneumonia) 01/12/2017  . ARF (acute renal failure) ([place]) 01/12/2017  . Shortness of breath   . Retinopathy due to secondary diabetes mellitus ([place])   . Pneumonia   . Obesity   . 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 ([place])   . Nephrolithiasis   . Hypertension   . Hyperlipemia   . Hepatitis   . Gout   . Goiter   . GERD (gastroesophageal reflux disease)   . Diverticulitis   . Diabetes ([place])   . Colon polyp   . Cellulitis and abscess of trunk   . Arthritis   . Chronic anemia   . Adrenal adenoma   . Morbid obesity ([place]) 08/13/2013  . Postoperative anemia due to acute blood loss 08/13/2013  . S/P right TKA 08/11/2013  . Preop cardiovascular exam 08/14/2012  . Chest pain 08/14/2012  . Suture granuloma 02/09/2012  . DYSPNEA ON EXERTION 10/04/2009  . Hyperlipidemia associated with type 2 diabetes mellitus ([place]) 04/15/2007  . OBESITY 04/15/2007  . ANEMIA, CHRONIC 04/15/2007  . ANXIETY 04/15/2007  . Depression 04/15/2007  . HTN (hypertension) 04/15/2007  . GERD  04/15/2007  . DEGENERATIVE JOINT DISEASE 04/15/2007  . HELICOBACTER PYLORI INFECTION, HX OF 04/15/2007  . COLONIC POLYPS 10/04/2006  . DIVERTICULOSIS, COLON 10/04/2006  . REFLUX ESOPHAGITIS 12/13/202003  . GASTRITIS, ACUTE 12/13/202003     Palliative Care Assessment & Plan    Recommendations/Plan:  Flexeril 5mg  TID PRN ordered for muscle cramps.  Tylenol in place for mild pain.  Norco 5/325 Q 4 PRN ordered for pain.   Would recommend minimizing narcotics by providing the Flexeril for muscle spasms/cramps, and Norco after giving Tylenol.   Code Status:    Code Status Orders  (From admission, onward)         Start     Ordered   11/27/17 0153  Do not attempt resuscitation (DNR)  Continuous    Question Answer Comment  In the event of cardiac or respiratory ARREST Do not call a "code blue"   In the event of cardiac or respiratory ARREST Do not perform Intubation, CPR, defibrillation or ACLS   In the event of cardiac or respiratory ARREST Use medication by any route, position, wound care, and other measures to relive pain and suffering. May use oxygen, suction and manual treatment of airway obstruction as needed for comfort.      11/27/17 0153        Code Status History    Date Active Date Inactive Code Status Order ID Comments User Context   11/08/2017 0942 11/16/2017 2230 DNR [id number]  Erick Colace, NP Inpatient   11/06/2017 0956 11/08/2017 0942 Partial Code [id number]  Rush Farmer, MD Inpatient   10/30/2017 1415 11/06/2017 0956 Full Code [id number]  Caren Griffins, MD ED   05/22/2017 2250 06/01/2017 1900 Full Code [id number]  Etta Quill, DO ED   01/13/2017 0250 01/15/2017 1622 Full Code [id number]  Jani Gravel, MD ED   08/11/2013 1955 08/13/2013 1559 Full Code [id number]  Babish, Lucille Passy, PA-C Inpatient       Prognosis:  Poor longterm.  Discharge Planning:  To Be Determined  Care plan was discussed with RN. EMR message sent to primary MD regarding plan.   Thank you for allowing the Palliative Medicine Team to assist in the care of this patient.   Total Time 25 min Prolonged Time Billed  no      Greater than 50%  of this time was spent counseling and coordinating care related to the  above assessment and plan.  Asencion Gowda, NP  Please contact Palliative Medicine Team phone at [phone] for questions and concerns.

## 2017-12-04 DIAGNOSIS — R0902 Hypoxemia: Secondary | ICD-10-CM

## 2017-12-04 LAB — GLUCOSE, CAPILLARY
GLUCOSE-CAPILLARY: 192 mg/dL — AB (ref 70–99)
GLUCOSE-CAPILLARY: 262 mg/dL — AB (ref 70–99)
GLUCOSE-CAPILLARY: 232 mg/dL — AB (ref 70–99)
Glucose-Capillary: 199 mg/dL — ABNORMAL HIGH (ref 70–99)

## 2017-12-04 LAB — BASIC METABOLIC PANEL
Anion gap: 7 (ref 5–15)
BUN: 19 mg/dL (ref 8–23)
CALCIUM: 8.8 mg/dL — AB (ref 8.9–10.3)
CHLORIDE: 104 mmol/L (ref 98–111)
CO2: 28 mmol/L (ref 22–32)
Creatinine, Ser: 1.39 mg/dL — ABNORMAL HIGH (ref 0.44–1.00)
GFR, EST AFRICAN AMERICAN: 40 mL/min — AB (ref >60)
GFR, EST NON AFRICAN AMERICAN: 35 mL/min — AB (ref >60)
Glucose, Bld: 193 mg/dL — ABNORMAL HIGH (ref 70–99)
Potassium: 4.2 mmol/L (ref 3.5–5.1)
SODIUM: 139 mmol/L (ref 135–145)

## 2017-12-04 MED ORDER — HYDROCERIN EX CREA
TOPICAL_CREAM | Freq: Two times a day (BID) | CUTANEOUS | Status: DC
  Administered 2017-12-04 – 2017-12-06 (×5): via TOPICAL
  Filled 2017-12-04: qty 113

## 2017-12-04 NOTE — Progress Notes (Signed)
CSW received consult regarding PT recommendation of SNF at discharge.  Patient is refusing SNF and her spouse is in agreement to discharge home with him and the proper equipment. RNCM aware  CSW signing off as no further needs present. Please re-consult if needed.   Percell Locus Caton Popowski LCSW [phone]

## 2017-12-04 NOTE — Progress Notes (Signed)
  Speech Language Pathology Treatment: Dysphagia  Patient Details Name: Lauren Crosby MRN: [id number] DOB: 07-May-1931 Today's Date: 12/04/2017 Time: 0355-9741 SLP Time Calculation (min) (ACUTE ONLY): 25 min  Assessment / Plan / Recommendation Clinical Impression  Pt was lethargic this morning, needing Mod-Max cues for sustained attention and alertness. When alert, she consumed straw sips of water and bites of puree without overt difficulty. Her husband was hoping to offer her more solid textures (like chips, salad, bagels), but pt showed no oral awareness when presented with graham cracker, as she had already fallen back asleep. We reviewed rationale for current diet and potential choking risk of more solid foods if she cannot properly masticate them with current mentation. Education was also provided about specific foods that would be included on current diet level. Will continue to follow to facilitate diet advancement.   HPI HPI: 84 y.o.femalewith medical history significant ofhypertension, hyperlipidemia, diabetes mellitus, GERD, gout, iron deficiency anemia, diverticulitis, kidney stone, thalassemia minor, with recent hospitalization from 10/22-11/8 due to septic shock which was possibly due to C. difficile colitis. requiredCRRT in ICU.  Function recovered with no further need of dialysis.  Briefly seen last admission by SLP to slowly advance diet from thin and puree to chewable textures due to confusion.       SLP Plan  Continue with current plan of care       Recommendations  Diet recommendations: Dysphagia 2 (fine chop);Thin liquid Liquids provided via: Cup;Straw Medication Administration: Whole meds with puree Supervision: Full supervision/cueing for compensatory strategies Compensations: Small sips/bites;Slow rate Postural Changes and/or Swallow Maneuvers: Seated upright 90 degrees;Upright 30-60 min after meal                Oral Care Recommendations: Oral care  BID Follow up Recommendations: Skilled Nursing facility SLP Visit Diagnosis: Dysphagia, unspecified (R13.10) Plan: Continue with current plan of care       GO                Germain Osgood 12/04/2017, 1:15 PM  Germain Osgood, M.A. [place] Acute Environmental education officer [phone] Office [phone]

## 2017-12-04 NOTE — Consult Note (Signed)
[facility] Nurse wound consult note Reason for Consult: Patient with Deep Tissue Pressure Injury (DTPI) to medial left buttock.  Husband in room and states it was present at home. He views the ulcer with me today and says that it has not changed. He has been putting antibiotic ointment on this area, but that when she was having fecal incontinence (diarrhea) he brought her to the hospital. Wound type:Pressure plus shear Pressure Injury POA: Yes Measurement: 3cm x 4.2cm purple/marroon discoloration. Skin is intact.  Wound AJO:INOM Drainage (amount, consistency, odor) None Periwound:Intact. Skin on opposing side (right medial buttock at gluteal cleft) blanches Dressing procedure/placement/frequency: I have ordered a mattress replacement with low air loss feature, a pressure redistribution pad for the chair, pressure redistribution heel boots while in bed and topical care to the DTPI using a silicone foam dressing.  Guidance has been provided for the Nursing staff via the Orders. A deep tissue pressure injury typically deteriorates as the tissue injury is at the muscle/blood vessel level and the damage presents downstream from identification-usually as a full thickness ulcer.  All preventive measure to arrest this ulcer's progression are in place, but may be insufficient.  Note:  Patient's husband tells me that his plan is to take his wife home and to let her lie in a recliner chair that has the full recline feature. He states he can turn her on her side in this chair.  I have concerns about his plan given that she is incontinent, has a pressure injury and does not turn easily.   [facility] nursing team will not follow, but will remain available to this patient, the nursing and medical teams.  Please re-consult if needed. Thanks, Maudie Flakes, MSN, RN, [place], Arther Abbott  Pager# [phone]

## 2017-12-04 NOTE — Progress Notes (Signed)
PROGRESS NOTE                                                                                                                                                                                                             Patient Demographics:    Lauren Crosby, is a 84 y.o. female, DOB - 1931-02-07, WHQ:[id number]  Admit date - 11/26/2017   Admitting Physician Ivor Costa, MD  Outpatient Primary MD for the patient is [name], MD  LOS - 7   Chief Complaint  Patient presents with  . Altered Mental Status       Brief Narrative     84 y.o. female with medical history significant of hypertension, hyperlipidemia, diabetes mellitus, GERD, gout, iron deficiency anemia, diverticulitis, kidney stone, thalassemia minor, with recent hospitalization from 10/22-11/8 due to septic shock which was possibly due to C. difficile colitis. Pt also had worsening renal function and required CRRT in ICU. Her kidney function has improved.  Her creatinine was 1.6 on 11/8. Pt was discharged on Lasix 40 mg daily to nursing home for rehab as stable condition. -Patient presents with confusion, fever, she did have episode of seizures as well, and decompensated respiratory failure where she was on BiPAP.   Subjective:   Patient in bed, appears comfortable, denies any headache, no fever, no chest pain or pressure, no shortness of breath , no abdominal pain. No focal weakness.    Assessment  & Plan :   Sepsis - she presented with fever tachycardia, elevated lactic acid and acute metabolic/toxic encephalopathy.  CT abdomen and pelvis showed left ureter punctate stones with proximal hydronephrosis, hydroureter and gas in the bladder.  Although her cultures were negative but there was clinical signs and symptoms of infection, sepsis pathophysiology has resolved, all cultures are negative, all antibiotics were stopped on 12/02/2017.  Acute hypercapnic respiratory failure  -  caused by acute on chronic diastolic CHF EF of 65% on echocardiogram, initially requiring BiPAP, he has been adequately diuresed, renal function has started to worsen hence would hold further Lasix for a few days and monitor clinically.  Seizures  - by neurology, appears to be seizure-free, switch to oral Keppra and monitor.  Acute metabolic/Toxic encephalopathy:  - Due to combination of sepsis, hypercapnia and possibly seizures, CT head negative, MRI nonacute, ammonia stable,  ABG suggested hypercapnia as well along with sepsis.  Has been seen by neurology, with supportive care her encephalopathy has improved moderately, remains at risk for delirium, minimize narcotics and benzodiazepines, use Haldol for delirium.  Note she was recently admitted few weeks ago and had severe delirium at that time as well.  He is to have very poor baseline functional status and long-term prognosis remains guarded and Pall care is following for [place].  Explained to daughter and husband in detail.  Advance activity, PT OT eval.  Depression: - Hold all home oral medications until mental status improves  HTN (hypertension):  - IV hydralazine PRN  GERD  - IV pepcid  Iron deficiency anemia:  - Received IV iron this admission, continue oral iron supplementation upon discharge.  Defer further work-up to PCP as age-appropriate.   CKD (chronic kidney disease), stage IV ([place]): recent cre 1.6 on 11/8-->1.43 today. BUN 27.   C. difficile diarrhea: no diarrhea currently - Resolved, will try to minimize antibiotic exposure.  Stop Rocephin on 12/02/2017.  Hypokalemia and hypomagnesemia- both replaced will continue to monitor.  Sacral decubitus ulcer present on admission.  Wound care nurse.  Kindly review RN notes for details.    DM2 with renal manifestations ([place]): Last A1c 6.7 on 05/24/17, well controled.  Currently on Lantus and sliding scale continue to monitor and adjust.    CBG (last 3)  Recent Labs     12/03/17 1754 12/03/17 2123 12/04/17 0803  GLUCAP 206* 265* 192*      Code Status : DNR  Family Communication  : I discussed with daughter over the phone in detail on 12/01/2017 and again 12/03/17, husband bedside 12/03/17  Disposition Plan  : likely SNF vs HHPT  Consults  :  PCCM, neurology, Pall. Care  Procedures  :    TTE -  Left ventricle: The cavity size was normal. Wall thickness was increased in a pattern of mild LVH. Systolic function was vigorous. The estimated ejection fraction was in the range of 65% to 70%. Wall motion was normal; there were no regional wall motion abnormalities. Doppler parameters are consistent with abnormal left ventricular relaxation (grade 1 diastolic  dysfunction). Doppler parameters are consistent with high ventricular filling pressure. - Ventricular septum: Septal motion showed abnormal function and dyssynergy. - Aortic valve: Peak velocity (S): 205 cm/s. - Mitral valve: Moderately calcified annulus. - Left atrium: The atrium was moderately dilated.  DVT Prophylaxis  :  Heparin  Lab Results  Component Value Date   PLT 90 (L) 12/02/2017    Antibiotics  :    Anti-infectives (From admission, onward)   Start     Dose/Rate Route Frequency Ordered Stop   12/01/17 1000  cefTRIAXone (ROCEPHIN) 1 g in sodium chloride 0.9 % 100 mL IVPB     1 g 200 mL/hr over 30 Minutes Intravenous Every 24 hours 12/01/17 0951 12/02/17 1130   11/30/17 1700  cefTRIAXone (ROCEPHIN) 2 g in sodium chloride 0.9 % 100 mL IVPB  Status:  Discontinued     2 g 200 mL/hr over 30 Minutes Intravenous Every 24 hours 11/30/17 1148 12/01/17 0941   11/29/17 2200  vancomycin (VANCOCIN) 1,500 mg in sodium chloride 0.9 % 500 mL IVPB  Status:  Discontinued     1,500 mg 250 mL/hr over 120 Minutes Intravenous Every 48 hours 11/28/17 1418 11/29/17 1242   11/28/17 1800  ceFEPIme (MAXIPIME) 1 g in sodium chloride 0.9 % 100 mL IVPB  Status:  Discontinued  1 g 200 mL/hr over 30 Minutes  Intravenous Every 24 hours 11/28/17 1418 11/30/17 1148   11/28/17 0000  vancomycin (VANCOCIN) 1,250 mg in sodium chloride 0.9 % 250 mL IVPB  Status:  Discontinued     1,250 mg 166.7 mL/hr over 90 Minutes Intravenous Every 24 hours 11/27/17 0159 11/28/17 1418   11/27/17 1800  ceFEPIme (MAXIPIME) 2 g in sodium chloride 0.9 % 100 mL IVPB  Status:  Discontinued     2 g 200 mL/hr over 30 Minutes Intravenous Every 24 hours 11/27/17 1502 11/28/17 1418   11/27/17 0200  metroNIDAZOLE (FLAGYL) IVPB 500 mg  Status:  Discontinued     500 mg 100 mL/hr over 60 Minutes Intravenous Every 8 hours 11/27/17 0126 12/01/17 0941   11/27/17 0200  aztreonam (AZACTAM) 1 g in sodium chloride 0.9 % 100 mL IVPB  Status:  Discontinued     1 g 200 mL/hr over 30 Minutes Intravenous Every 8 hours 11/27/17 0159 11/27/17 1502   11/27/17 0200  vancomycin (VANCOCIN) 2,000 mg in sodium chloride 0.9 % 500 mL IVPB     2,000 mg 250 mL/hr over 120 Minutes Intravenous  Once 11/27/17 0159 11/27/17 0606   11/26/17 2200  cefTRIAXone (ROCEPHIN) 2 g in sodium chloride 0.9 % 100 mL IVPB  Status:  Discontinued     2 g 200 mL/hr over 30 Minutes Intravenous Every 24 hours 11/26/17 2140 11/27/17 0125        Objective:   Vitals:   12/03/17 0555 12/03/17 0802 12/03/17 1541 12/03/17 2111  BP: (!) 128/46 (!) 126/39 (!) 110/47 (!) 112/49  Pulse: 70 75 73 79  Resp:      Temp:   98.5 F (36.9 C) (!) 97.4 F (36.3 C)  TempSrc:   Oral Oral  SpO2: 98% 98% 97% 94%  Weight:      Height:        Wt Readings from Last 3 Encounters:  12/03/17 105 kg  11/22/17 109.8 kg  11/19/17 109.8 kg     Intake/Output Summary (Last 24 hours) at 12/04/2017 1112 Last data filed at 12/04/2017 0900 Gross per 24 hour  Intake 47.7 ml  Output 2275 ml  Net -2227.3 ml     Physical Exam  Awake remains pleasantly confused, no focal deficits, Foley in place [place].AT,PERRAL Supple Neck,No JVD, No cervical lymphadenopathy appriciated.  Symmetrical Chest  wall movement, Good air movement bilaterally, CTAB RRR,No Gallops, Rubs or new Murmurs, No Parasternal Heave +ve B.Sounds, Abd Soft, No tenderness, No organomegaly appriciated, No rebound - guarding or rigidity. No Cyanosis, Clubbing or edema, No new Rash or bruise    Data Review:    CBC Recent Labs  Lab 11/28/17 0327 11/29/17 0256 11/30/17 0401 12/01/17 0448 12/02/17 0431  WBC 4.4 4.5 3.4* 3.5* 3.9*  HGB 7.6* 8.0* 8.1* 7.7* 7.9*  HCT 25.9* 27.0* 27.0* 26.1* 26.9*  PLT 107* 104* 100* 109* 90*  MCV 74.4* 74.8* 73.8* 73.9* 72.7*  MCH 21.8* 22.2* 22.1* 21.8* 21.4*  MCHC 29.3* 29.6* 30.0 29.5* 29.4*  RDW 20.6* 20.7* 20.2* 20.2* 19.9*    Chemistries  Recent Labs  Lab 11/30/17 0401 12/01/17 0448 12/02/17 0431 12/03/17 0444 12/04/17 0810  NA 149* 144 141 140 139  K 3.4* 3.2* 3.2* 3.4* 4.2  CL 114* 106 105 103 104  CO2 27 29 27 29 28   GLUCOSE 121* 225* 204* 199* 193*  BUN 32* 28* 23 21 19   CREATININE 1.53* 1.39* 1.22* 1.21* 1.39*  CALCIUM 8.3* 8.1*  8.4* 8.4* 8.8*  MG  --   --  1.4* 1.7  --    ------------------------------------------------------------------------------------------------------------------ No results for input(s): CHOL, HDL, LDLCALC, TRIG, CHOLHDL, LDLDIRECT in the last 72 hours.  Lab Results  Component Value Date   HGBA1C 6.7 (H) 05/24/2017   ------------------------------------------------------------------------------------------------------------------ No results for input(s): TSH, T4TOTAL, T3FREE, THYROIDAB in the last 72 hours.  Invalid input(s): FREET3 ------------------------------------------------------------------------------------------------------------------ No results for input(s): VITAMINB12, FOLATE, FERRITIN, TIBC, IRON, RETICCTPCT in the last 72 hours.  Coagulation profile No results for input(s): INR, PROTIME in the last 168 hours.  No results for input(s): DDIMER in the last 72 hours.  Cardiac Enzymes No results for  input(s): CKMB, TROPONINI, MYOGLOBIN in the last 168 hours.  Invalid input(s): CK ------------------------------------------------------------------------------------------------------------------    Component Value Date/Time   BNP 859.9 (H) 11/27/2017 0327   BNP 18.5 08/14/2012 0930    Inpatient Medications  Scheduled Meds: . chlorhexidine  15 mL Mouth Rinse BID  . famotidine  20 mg Oral QHS  . furosemide  60 mg Oral Daily  . heparin  5,000 Units Subcutaneous Q8H  . hydrocerin   Topical BID  . insulin aspart  0-5 Units Subcutaneous QHS  . insulin aspart  0-9 Units Subcutaneous TID WC  . insulin glargine  7 Units Subcutaneous QHS  . levETIRAcetam  1,000 mg Oral BID  . mouth rinse  15 mL Mouth Rinse q12n4p  . tamsulosin  0.4 mg Oral Daily   Continuous Infusions: . sodium chloride Stopped (12/01/17 1519)   PRN Meds:.sodium chloride, acetaminophen **OR** [DISCONTINUED] acetaminophen, cyclobenzaprine, haloperidol lactate, hydrALAZINE, HYDROcodone-acetaminophen, [DISCONTINUED] ondansetron **OR** ondansetron (ZOFRAN) IV  Micro Results Recent Results (from the past 240 hour(s))  Blood Culture (routine x 2)     Status: None   Collection Time: 11/26/17  9:52 PM  Result Value Ref Range Status   Specimen Description BLOOD RIGHT HAND  Final   Special Requests   Final    BOTTLES DRAWN AEROBIC AND ANAEROBIC Blood Culture adequate volume   Culture   Final    NO GROWTH 5 DAYS Performed at [facility], [place] [address] [place], [place] [address]    Report Status 12/01/2017 FINAL  Final  Urine culture     Status: None   Collection Time: 11/26/17 10:37 PM  Result Value Ref Range Status   Specimen Description URINE, CATHETERIZED  Final   Special Requests NONE  Final   Culture   Final    NO GROWTH Performed at [facility], [place] [address] [place], [place] [address]    Report Status 11/28/2017 FINAL  Final  Blood Culture (routine x 2)     Status: None   Collection Time:  11/26/17 11:39 PM  Result Value Ref Range Status   Specimen Description BLOOD LEFT FOREARM  Final   Special Requests   Final    BOTTLES DRAWN AEROBIC AND ANAEROBIC Blood Culture adequate volume   Culture   Final    NO GROWTH 5 DAYS Performed at [facility], [place] [address], [place], [place] [address]    Report Status 12/02/2017 FINAL  Final  Respiratory Panel by PCR     Status: None   Collection Time: 11/27/17  1:38 PM  Result Value Ref Range Status   Adenovirus NOT DETECTED NOT DETECTED Final   Coronavirus 229E NOT DETECTED NOT DETECTED Final   Coronavirus HKU1 NOT DETECTED NOT DETECTED Final   Coronavirus NL63 NOT DETECTED NOT DETECTED Final   Coronavirus  OC43 NOT DETECTED NOT DETECTED Final   Metapneumovirus NOT DETECTED NOT DETECTED Final   Rhinovirus / Enterovirus NOT DETECTED NOT DETECTED Final   Influenza A NOT DETECTED NOT DETECTED Final   Influenza B NOT DETECTED NOT DETECTED Final   Parainfluenza Virus 1 NOT DETECTED NOT DETECTED Final   Parainfluenza Virus 2 NOT DETECTED NOT DETECTED Final   Parainfluenza Virus 3 NOT DETECTED NOT DETECTED Final   Parainfluenza Virus 4 NOT DETECTED NOT DETECTED Final   Respiratory Syncytial Virus NOT DETECTED NOT DETECTED Final   Bordetella pertussis NOT DETECTED NOT DETECTED Final   Chlamydophila pneumoniae NOT DETECTED NOT DETECTED Final   Mycoplasma pneumoniae NOT DETECTED NOT DETECTED Final    Comment: Performed at [facility], [place] [address] [place], [place] [address]    Radiology Reports Ct Abdomen Pelvis Wo Contrast  Result Date: 11/27/2017 CLINICAL DATA:  Abdominal distention, altered mental status, sepsis. History of Clostridium difficile colitis, open wound of the abdominal wall, nephrolithiasis, hernia, gastroesophageal reflux disease, diverticulitis, colon polyp, cellulitis and abscess, adrenal adenoma, ventral hernia repair, hysterectomy, sigmoid resection, cholecystectomy, appendectomy. EXAM: CT ABDOMEN AND  PELVIS WITHOUT CONTRAST TECHNIQUE: Multidetector CT imaging of the abdomen and pelvis was performed following the standard protocol without IV contrast. COMPARISON:  10/30/2017 FINDINGS: Lower chest: Evaluation is limited due to motion artifact. Atelectasis or consolidation in the lung bases, greater on the left. Coronary artery calcifications. Calcification in the mitral valve annulus. Hepatobiliary: No focal liver abnormality is seen. Status post cholecystectomy. No biliary dilatation. Pancreas: Fatty infiltration of the pancreas. Spleen: Normal in size without focal abnormality. Adrenals/Urinary Tract: 3.1 cm diameter left adrenal gland nodule. No change since previous study. Fat attenuation consistent with benign adenoma. Bilateral renal parenchymal atrophy. Mild left hydronephrosis and hydroureter with tiny punctate stones suggested in the distal left ureter. Bladder wall is not thickened. Gas in the bladder may come from instrumentation or infection. Exophytic lesion arising from the right kidney without change since prior study. Hyperdensity likely indicating a hemorrhagic cyst. Stomach/Bowel: Stomach, small bowel, and colon are not abnormally distended. Scattered stool throughout the colon. Scattered colonic diverticula. No evidence of diverticulitis. Prominent stool in the rectum with mild rectal wall thickening may indicate stercoral colitis. Infiltration in the presacral fat is unchanged since prior study, possibly inflammatory or reactive. Vascular/Lymphatic: Aortic atherosclerosis. No enlarged abdominal or pelvic lymph nodes. Reproductive: Status post hysterectomy. No adnexal masses. Other: Small amount of free fluid around the liver and spleen. Likely ascites. Scarring in the anterior abdominal wall consistent with postoperative change. Edema in the subcutaneous fat. No free air in the abdomen. Musculoskeletal: Degenerative changes in the spine. No destructive bone lesions. IMPRESSION: 1. Atelectasis  or consolidation in the lung bases, greater on the left. 2. Left adrenal gland adenoma. 3. Bilateral renal parenchymal atrophy. Punctate stones suggested in the distal left ureter. Mild proximal hydronephrosis and hydroureter. Gas in the bladder may be due to instrumentation or infection. 4. Prominent stool in the rectum with rectal wall thickening suggesting possible stercoral colitis. 5. Small amount of free fluid in the abdomen and pelvis, likely ascites. 6. Probable hemorrhagic cysts in the right kidney. Aortic Atherosclerosis (ICD10-I70.0). Electronically Signed   By: Lucienne Capers M.D.   On: 11/27/2017 03:10   Dg Abd 1 View  Result Date: 11/09/2017 CLINICAL DATA:  Patient pulled out nasogastric catheter EXAM: ABDOMEN - 1 VIEW COMPARISON:  11/07/2017 FINDINGS: Scattered large and small bowel gas is noted. The previously seen nasogastric catheter has  been removed in the interval. Despite the given clinical history of metal tipped catheter this catheter on previous images represented a standard nasogastric catheter without metallic weighting. No foreign body is noted. IMPRESSION: No evidence of retained foreign body as the prior nasogastric catheter was not a weighted tipped catheter based on previous images. Electronically Signed   By: Inez Catalina M.D.   On: 11/09/2017 19:57   Dg Abd 1 View  Result Date: 11/07/2017 CLINICAL DATA:  84 year old female with feeding tube placement. EXAM: ABDOMEN - 1 VIEW COMPARISON:  Earlier abdominal radiograph dated 11/07/2017 FINDINGS: No significant change in the appearance or positioning of the enteric tube which appears somewhat folded, likely in the mid to distal stomach. IMPRESSION: No significant interval change. Electronically Signed   By: Anner Crete M.D.   On: 11/07/2017 23:00   Ct Head Wo Contrast  Result Date: 11/27/2017 CLINICAL DATA:  Altered mental status. History of hypertension, hepatitis, diabetes, edema. EXAM: CT HEAD WITHOUT CONTRAST  TECHNIQUE: Contiguous axial images were obtained from the base of the skull through the vertex without intravenous contrast. COMPARISON:  MRI brain 11/29/2016. CT head 11/24/2016. FINDINGS: Brain: Diffuse cerebral atrophy. Ventricular dilatation consistent with central atrophy. Low-attenuation changes throughout the deep white matter consistent with small vessel ischemia. No mass-effect or midline shift. No abnormal extra-axial fluid collections. Gray-white matter junctions are distinct. Basal cisterns are not effaced. No acute intracranial hemorrhage. Vascular: Moderate intracranial arterial vascular calcifications are present. Skull: Calvarium appears intact. Sinuses/Orbits: Paranasal sinuses and mastoid air cells are clear. Other: None. IMPRESSION: No acute intracranial abnormalities. Chronic atrophy and small vessel ischemic changes. Electronically Signed   By: Lucienne Capers M.D.   On: 11/27/2017 03:02   Mr Brain Wo Contrast  Result Date: 11/30/2017 CLINICAL DATA:  Initial evaluation for acute altered mental status, seizure, unclear cause. EXAM: MRI HEAD WITHOUT CONTRAST TECHNIQUE: Multiplanar, multiecho pulse sequences of the brain and surrounding structures were obtained without intravenous contrast. COMPARISON:  Prior CT from 11/27/2017. FINDINGS: Brain: Diffuse prominence of the CSF containing spaces compatible with generalized age-related cerebral atrophy, relatively stable in appearance as compared to most recent MRI from 11/29/2016. There has been interval development of extensive T2/FLAIR signal abnormality throughout the supratentorial and infratentorial cerebral white matter, involving both cerebral hemispheres in a fairly symmetric pattern. Slight posterior predominance involving the parieto-occipital regions noted. Involvement of both internal and external capsules. Patchy infratentorial involvement involving the bilateral cerebellar hemispheres. Suggestion of associated mild edema as a few  cortical gyri appear slightly expanded as compared to previous exam (series 10, image 20). Sparing of the overlying cortical gray matter which is relatively normal in appearance. Findings are nonspecific. Superimposed chronic microvascular ischemic changes noted within the pons, otherwise similar to previous. No abnormal foci of restricted diffusion to suggest acute or subacute ischemia. No encephalomalacia to suggest chronic cortical infarction. No foci of susceptibility artifact to suggest acute or chronic intracranial hemorrhage. No mass lesion, midline shift or mass effect. Stable ventricular prominence related global parenchymal volume loss without hydrocephalus. No extra-axial fluid collection. Pituitary gland normal. Midline structures intact. Vascular: Major intracranial vascular flow voids are maintained. Skull and upper cervical spine: Craniocervical junction within normal limits. No focal marrow replacing lesion. Scalp soft tissues unremarkable. Sinuses/Orbits: Patient status post ocular lens replacement bilaterally. Paranasal sinuses are clear. Small right mastoid effusion noted. Other: None. IMPRESSION: Extensive abnormal T2/FLAIR signal abnormality throughout the cerebral white matter as above, new relative to most recent MRI from 11/29/2016. Findings are nonspecific,  with primary differential considerations including PRES versus toxic/metabolic derangement. Anoxic brain injury felt to be unlikely given the relative sparing of the gray matter structures. Correlation with laboratory values and history recommended. Electronically Signed   By: Jeannine Boga M.D.   On: 11/30/2017 14:18   Dg Chest Port 1 View  Result Date: 12/02/2017 CLINICAL DATA:  Shortness of breath EXAM: PORTABLE CHEST 1 VIEW COMPARISON:  11/30/2017 and prior exams FINDINGS: Cardiomegaly and pulmonary vascular congestion again noted. LEFT LOWER lung consolidation/atelectasis and mild RIGHT basilar atelectasis again noted. No  pneumothorax. There has been little interval change since prior studies IMPRESSION: Unchanged appearance of the chest with continued LEFT LOWER lung consolidation/atelectasis and mild RIGHT basilar atelectasis. Electronically Signed   By: Margarette Canada M.D.   On: 12/02/2017 08:12   Dg Chest Port 1 View  Result Date: 11/30/2017 CLINICAL DATA:  Shortness of breath, hypertension EXAM: PORTABLE CHEST 1 VIEW COMPARISON:  11/28/2017 FINDINGS: Cardiomegaly with vascular congestion and probable mild pulmonary edema. Findings are similar prior study. Suspect small layering effusions. No acute bony abnormality. IMPRESSION: Cardiomegaly with mild pulmonary edema and small layering effusions. No real change since prior study. Electronically Signed   By: Rolm Baptise M.D.   On: 11/30/2017 08:54   Dg Chest Port 1 View  Result Date: 11/28/2017 CLINICAL DATA:  Hypoxia. EXAM: PORTABLE CHEST 1 VIEW COMPARISON:  Radiograph November 26, 2017. FINDINGS: Stable cardiomegaly is noted with central pulmonary vascular congestion. No pneumothorax or pleural effusion is noted. Possible mild bilateral pulmonary edema may be present. Bony thorax is unremarkable. IMPRESSION: Stable cardiomegaly with central pulmonary vascular congestion and possible mild bilateral pulmonary edema. Electronically Signed   By: Marijo Conception, M.D.   On: 11/28/2017 12:55   Dg Chest Port 1 View  Result Date: 11/26/2017 CLINICAL DATA:  Altered mental status EXAM: PORTABLE CHEST 1 VIEW COMPARISON:  11/13/2017 FINDINGS: AP portable semi upright view of the chest. The patient is rotated to the right. Stable cardiomegaly with interstitial edema. No pulmonary confluence, effusion or pneumothorax. No acute osseous abnormality. IMPRESSION: Cardiomegaly with interstitial edema.  No significant change. Electronically Signed   By: Ashley Royalty M.D.   On: 11/26/2017 22:48   Dg Chest Port 1 View  Result Date: 11/13/2017 CLINICAL DATA:  Shortness of breath EXAM:  PORTABLE CHEST 1 VIEW COMPARISON:  11/09/2017 FINDINGS: Cardiomegaly and slightly increased pulmonary vascular congestion noted. Mild bibasilar opacities/atelectasis noted in this low volume film. A RIGHT central venous catheter with tips overlying the LOWER SVC/SUPERIOR cavoatrial junction noted. No pneumothorax. IMPRESSION: Cardiomegaly with pulmonary vascular congestion and mild bibasilar opacities/atelectasis. Electronically Signed   By: Margarette Canada M.D.   On: 11/13/2017 07:49   Dg Chest Port 1 View  Result Date: 11/09/2017 CLINICAL DATA:  Patient removed recent nasogastric catheter EXAM: PORTABLE CHEST 1 VIEW COMPARISON:  11/08/2017 FINDINGS: Cardiac shadow is enlarged. Right jugular central line is again seen and stable. Inspiratory effort is poor with crowding of the vascular markings. No focal confluent infiltrate is seen. No radiopaque foreign body is noted. The previously seen nasogastric catheter has been removed and as previously described was not a weighted tipped catheter. IMPRESSION: Poor inspiratory effort.  No acute abnormality noted. Electronically Signed   By: Inez Catalina M.D.   On: 11/09/2017 19:58   Dg Chest Port 1 View  Result Date: 11/08/2017 CLINICAL DATA:  Respiratory failure. EXAM: PORTABLE CHEST 1 VIEW COMPARISON:  11/07/2017 and older exams. FINDINGS: Mild enlargement of the cardiopericardial silhouette,  stable. Prominent bronchovascular markings, with additional lung base opacity, the latter consistent with atelectasis, also without change from the previous day's exam. New nasal/orogastric tube passes below the included field of view, likely into the stomach. Right subclavian dual lumen central venous catheter is stable, tip in the right atrium. No pneumothorax. IMPRESSION: 1. No significant change in lung aeration from the previous day's study. There are prominent bronchovascular markings and lung base atelectasis, but no convincing pulmonary edema or pneumonia. 2. New  nasal/orogastric tube. Tip not visualized, but likely extends into the stomach. Electronically Signed   By: Lajean Manes M.D.   On: 11/08/2017 07:13   Dg Chest Port 1 View  Result Date: 11/07/2017 CLINICAL DATA:  Acute respiratory failure with hypoxia. EXAM: PORTABLE CHEST 1 VIEW COMPARISON:  10/31/2017. FINDINGS: Cardiomegaly. Improved lung volumes. Increased perihilar markings could represent early edema or viral pneumonitis. Other than improved lung markings, similar appearance to priors. Dialysis catheter has been inserted via RIGHT IJ approach. Tip slide in the RIGHT atrium. There is no pneumothorax. IMPRESSION: Satisfactory catheter placement. No significant change in the appearance of the lungs except for improved volume. Cardiomegaly. Electronically Signed   By: Staci Righter M.D.   On: 11/07/2017 11:30   Dg Abd Portable 1v  Result Date: 11/07/2017 CLINICAL DATA:  Nasogastric tube placement. EXAM: PORTABLE ABDOMEN - 1 VIEW COMPARISON:  11/03/2017 FINDINGS: Interval nasogastric tube folded back upon itself in the mid to distal stomach with its tip in the mid stomach. The included bowel gas pattern is normal. Prominent interstitial markings at the left lung base. Lower thoracic spine degenerative changes. IMPRESSION: Nasogastric tube folded back upon itself in the mid to distal stomach with its tip in the mid stomach. Electronically Signed   By: Claudie Revering M.D.   On: 11/07/2017 13:13     Signature  Lala Lund M.D on 12/04/2017 at 11:12 AM   -  To page go to [email or web address] - password [facility]

## 2017-12-04 NOTE — Progress Notes (Signed)
Physical Therapy Treatment Patient Details Name: Lauren Crosby MRN: [id number] DOB: 1931/09/27 Today's Date: 12/04/2017    History of Present Illness Patient is a 84 yo female who presents with AMS and fever; pt with seizure in hospital with decompensated respiratory failure requiring BiPAP. Recently admitted 10/22-11/8 due to septic shock. PMH includes DM, CKD, HTN, HLD, morbid obesity, gout.    PT Comments    Patient's tolerance to treatment today was fair.  Patient was in bed with husband present upon PT arrival.  Patient presents with self-limiting behavior.  Treatment limited to LE exercises in supine with pt moaning throughout session.  Patient continues to be a good candidate for SNF placement based on current functional status.    Follow Up Recommendations  SNF;Supervision/Assistance - 24 hour(pt adamant about returning home and will require HHPT for family training and to educate on use of equipment)     [facility] bed;Wheelchair (measurements PT);Wheelchair cushion (measurements PT);3in1 (PT)(hoyer lift and pad, elevating leg rests)    Recommendations for Other Services OT consult     Precautions / Restrictions Precautions Precautions: Fall Restrictions Weight Bearing Restrictions: No    Mobility  Bed Mobility                  Transfers                    Ambulation/Gait                 Stairs             Wheelchair Mobility    Modified Rankin (Stroke Patients Only)       Balance                                            Cognition Arousal/Alertness: Awake/alert Behavior During Therapy: WFL for tasks assessed/performed Overall Cognitive Status: Impaired/Different from baseline Area of Impairment: Safety/judgement;Following commands;Problem solving                       Following Commands: Follows one step commands with increased time;Follows one step commands  inconsistently Safety/Judgement: Decreased awareness of safety;Decreased awareness of deficits   Problem Solving: Slow processing;Decreased initiation;Requires verbal cues;Requires tactile cues        Exercises General Exercises - Lower Extremity Ankle Circles/Pumps: AROM;Both;20 reps;Supine Quad Sets: AROM;Both;10 reps;Supine Heel Slides: AROM;Both;10 reps;Supine    General Comments        Pertinent Vitals/Pain Pain Assessment: 0-10 Pain Score: 5  Faces Pain Scale: Hurts little more Pain Location: L side Pain Descriptors / Indicators: Discomfort;Moaning Pain Intervention(s): Limited activity within patient's tolerance;Monitored during session    Home Living                      Prior Function            PT Goals (current goals can now be found in the care plan section) Acute Rehab PT Goals Patient Stated Goal: to return home PT Goal Formulation: With patient Time For Goal Achievement: 12/15/17 Potential to Achieve Goals: Fair Progress towards PT goals: Progressing toward goals    Frequency    Min 2X/week      PT Plan Current plan remains appropriate    Co-evaluation              AM-PAC  PT "6 Clicks" Mobility   Outcome Measure  Help needed turning from your back to your side while in a flat bed without using bedrails?: A Lot Help needed moving from lying on your back to sitting on the side of a flat bed without using bedrails?: A Lot Help needed moving to and from a bed to a chair (including a wheelchair)?: Total Help needed standing up from a chair using your arms (e.g., wheelchair or bedside chair)?: A Lot Help needed to walk in hospital room?: Total Help needed climbing 3-5 steps with a railing? : Total 6 Click Score: 9    End of Session   Activity Tolerance: Patient limited by pain Patient left: in bed;with call bell/phone within reach;with bed alarm set;with nursing/sitter in room;with family/visitor present Nurse Communication:  Mobility status;Need for lift equipment(stedy) PT Visit Diagnosis: Unsteadiness on feet (R26.81);Other abnormalities of gait and mobility (R26.89);Muscle weakness (generalized) (M62.81)     Time: 1517-6160 PT Time Calculation (min) (ACUTE ONLY): 11 min  Charges:  $Therapeutic Exercise: 8-22 mins                     [address], Sharolyn Douglas 12/04/2017, 4:56 PM

## 2017-12-04 NOTE — Progress Notes (Signed)
Daily Progress Note   Patient Name: Lauren Crosby       Date: 12/04/17 DOB: 05-03-1931  Age: 84 y.o. MRN#: [id number] Attending Physician: Thurnell Lose, MD Primary Care Physician: [name], MD Admit Date: 11/26/2017  Reason for Consultation/Follow-up: Establishing goals of care  Subjective/GOC: Patient is awake but moaning. Recently given prn flexeril. She is disoriented.   Palliative f/u with husband, Elenore Rota, at bedside. Discussed course of hospitalization and interventions. Elenore Rota speaks of his plan to take her home now instead of rehab. He is hopeful that her memory will clear up at home. He is considering a hospital bed at the house. We discussed outpatient palliative follow-up. Elenore Rota speaks of Dr. Candiss Norse mentioning she may need hospice services in the near future. I did educate on hospice philosophy and options. Elenore Rota speaks of not needing that now but was appreciative of the information. PMT contact information given. Explained process of RN CM discussing discharge plan with him tomorrow.   Length of Stay: 7  Current Medications: Scheduled Meds:  . chlorhexidine  15 mL Mouth Rinse BID  . famotidine  20 mg Oral QHS  . heparin  5,000 Units Subcutaneous Q8H  . hydrocerin   Topical BID  . insulin aspart  0-5 Units Subcutaneous QHS  . insulin aspart  0-9 Units Subcutaneous TID WC  . insulin glargine  7 Units Subcutaneous QHS  . levETIRAcetam  1,000 mg Oral BID  . mouth rinse  15 mL Mouth Rinse q12n4p  . tamsulosin  0.4 mg Oral Daily    Continuous Infusions: . sodium chloride Stopped (12/01/17 1519)    PRN Meds: sodium chloride, acetaminophen **OR** [DISCONTINUED] acetaminophen, cyclobenzaprine, haloperidol lactate, hydrALAZINE, HYDROcodone-acetaminophen,  [DISCONTINUED] ondansetron **OR** ondansetron (ZOFRAN) IV  Physical Exam  Constitutional: She appears ill.  HENT:  Head: Normocephalic and atraumatic.  Pulmonary/Chest: No accessory muscle usage. No tachypnea. No respiratory distress.  Neurological: She is alert.  Oriented to person. Pleasantly confused  Skin: Skin is warm and dry.  Psychiatric: Her speech is delayed. She is inattentive.  Nursing note and vitals reviewed.          Vital Signs: BP (!) 129/51 (BP Location: Right Arm)   Pulse 77   Temp 98.4 F (36.9 C) (Oral)   Resp 19   Ht 5\' 2"  (1.575 m)  Wt 105 kg   SpO2 98%   BMI 42.34 kg/m  SpO2: SpO2: 98 % O2 Device: O2 Device: Room Air O2 Flow Rate: O2 Flow Rate (L/min): 6 L/min  Intake/output summary:   Intake/Output Summary (Last 24 hours) at 12/04/2017 1659 Last data filed at 12/04/2017 1538 Gross per 24 hour  Intake 47.7 ml  Output 1425 ml  Net -1377.3 ml   LBM: Last BM Date: 12/03/17 Baseline Weight: Weight: 104.9 kg Most recent weight: Weight: 105 kg       Palliative Assessment/Data: PPS 40%   Flowsheet Rows     Most Recent Value  Intake Tab  Referral Department  Hospitalist  Unit at Time of Referral  Intermediate Care Unit  Date Notified  11/28/17  Palliative Care Type  Return patient Palliative Care  Reason for referral  Clarify Goals of Care  Date of Admission  11/26/17  Date first seen by Palliative Care  11/29/17  # of days Palliative referral response time  1 Day(s)  # of days IP prior to Palliative referral  2  Clinical Assessment  Pain Max last 24 hours  Not able to report  Pain Min Last 24 hours  Not able to report  Dyspnea Max Last 24 Hours  Not able to report  Dyspnea Min Last 24 hours  Not able to report  Psychosocial & Spiritual Assessment  Palliative Care Outcomes      Patient Active Problem List   Diagnosis Date Noted  . Pressure injury of skin 11/30/2017  . Type II diabetes mellitus with renal manifestations ([place])  11/27/2017  . Sepsis ([place]) 11/23/2017  . C. difficile diarrhea 11/23/2017  . Acute respiratory failure with hypoxemia ([place])   . Acute pulmonary edema ([facility])   . Acute metabolic encephalopathy   . Palliative care by specialist   . Goals of care, counseling/discussion   . Hyperglycemia due to type 2 diabetes mellitus ([place]) 11/04/2017  . Encephalopathy acute 11/04/2017  . Shock circulatory ([place]) 11/03/2017  . Colitis, acute 10/31/2017  . ATN (acute tubular necrosis) ([place]) 10/31/2017  . Diarrhea 10/30/2017  . CKD (chronic kidney disease), stage IV ([place]) 10/30/2017  . Pyelitis: Mild per CT 05/24/2017 05/25/2017  . Acidemia   . Abdominal pain 05/24/2017  . Vaginal candidiasis 05/24/2017  . Dermatitis associated with moisture 05/24/2017  . Ileus [facility]): Colonic 05/24/2017  . Acute lower UTI 05/23/2017  . Iron deficiency anemia 05/23/2017  . Pressure injury of buttock, stage 2 ([place]) 05/23/2017  . Acute on chronic anemia   . Hyperkalemia   . Hydronephrosis   . Acute kidney failure ([place]) 05/22/2017  . Metabolic acidosis, normal anion gap (NAG) 05/22/2017  . Hyperkalemia, diminished renal excretion 05/22/2017  . Rhinovirus infection   . AKI (acute kidney injury) ([place])   . HCAP (healthcare-associated pneumonia) 01/12/2017  . ARF (acute renal failure) ([place]) 01/12/2017  . Shortness of breath   . Retinopathy due to secondary diabetes mellitus ([place])   . Pneumonia   . Obesity   . 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 ([place])   . Nephrolithiasis   . Hypertension   . Hyperlipemia   . Hepatitis   . Gout   . Goiter   . GERD (gastroesophageal reflux disease)   . Diverticulitis   . Diabetes ([place])   . Colon polyp   . Cellulitis and abscess of trunk   . Arthritis   . Chronic anemia   . Adrenal adenoma   . Morbid obesity ([place]) 08/13/2013  .  Postoperative anemia due to acute blood loss 08/13/2013  . S/P right TKA 08/11/2013  . Preop cardiovascular exam 08/14/2012  . Chest pain  08/14/2012  . Suture granuloma 02/09/2012  . DYSPNEA ON EXERTION 10/04/2009  . Hyperlipidemia associated with type 2 diabetes mellitus ([place]) 04/15/2007  . OBESITY 04/15/2007  . ANEMIA, CHRONIC 04/15/2007  . ANXIETY 04/15/2007  . Depression 04/15/2007  . HTN (hypertension) 04/15/2007  . GERD 04/15/2007  . DEGENERATIVE JOINT DISEASE 04/15/2007  . HELICOBACTER PYLORI INFECTION, HX OF 04/15/2007  . COLONIC POLYPS 10/04/2006  . DIVERTICULOSIS, COLON 10/04/2006  . REFLUX ESOPHAGITIS 2020-12-801  . GASTRITIS, ACUTE 2020-12-801    Palliative Care Assessment & Plan   Assessment: Sepsis Acute hypercapnic respiratory failure Acute on chronic diastolic CHF  Seizures Acute metabolic encephalopathy C. Difficile Sacral decubitus  Recommendations/Plan:  Continue prn medications for pain/muscle spasms  Husband declines SNF placement. Requesting to take wife home with home health including physical therapy. Requesting hospital bed. RN CM notified.   Husband agreeable with outpatient palliative referral.  Code Status: DNR   Code Status Orders  (From admission, onward)         Start     Ordered   11/27/17 0153  Do not attempt resuscitation (DNR)  Continuous    Question Answer Comment  In the event of cardiac or respiratory ARREST Do not call a "code blue"   In the event of cardiac or respiratory ARREST Do not perform Intubation, CPR, defibrillation or ACLS   In the event of cardiac or respiratory ARREST Use medication by any route, position, wound care, and other measures to relive pain and suffering. May use oxygen, suction and manual treatment of airway obstruction as needed for comfort.      11/27/17 0153        Code Status History    Date Active Date Inactive Code Status Order ID Comments User Context   11/08/2017 0942 11/16/2017 2230 DNR [id number]  Erick Colace, NP Inpatient   11/06/2017 0956 11/08/2017 0942 Partial Code [id number]  Rush Farmer, MD Inpatient    10/30/2017 1415 11/06/2017 0956 Full Code [id number]  Caren Griffins, MD ED   05/22/2017 2250 06/01/2017 1900 Full Code [id number]  Etta Quill, DO ED   01/13/2017 0250 01/15/2017 1622 Full Code [id number]  Jani Gravel, MD ED   08/11/2013 1955 08/13/2013 1559 Full Code [id number]  Babish, Lucille Passy, PA-C Inpatient       Prognosis:   Unable to determine  Discharge Planning:  Home with [place] was discussed with husband, RN  Thank you for allowing the Palliative Medicine Team to assist in the care of this patient.   Time In: 1630 Time Out: 1700 Total Time 30 Prolonged Time Billed  no      Greater than 50%  of this time was spent counseling and coordinating care related to the above assessment and plan.  Ihor Dow, FNP-C Palliative Medicine Team  Phone: [phone] Fax: [phone]  Please contact Palliative Medicine Team phone at [phone] for questions and concerns.

## 2017-12-05 DIAGNOSIS — R0902 Hypoxemia: Secondary | ICD-10-CM

## 2017-12-05 LAB — GLUCOSE, CAPILLARY
GLUCOSE-CAPILLARY: 191 mg/dL — AB (ref 70–99)
GLUCOSE-CAPILLARY: 194 mg/dL — AB (ref 70–99)
Glucose-Capillary: 203 mg/dL — ABNORMAL HIGH (ref 70–99)

## 2017-12-05 LAB — BASIC METABOLIC PANEL
ANION GAP: 7 (ref 5–15)
BUN: 18 mg/dL (ref 8–23)
CALCIUM: 9.1 mg/dL (ref 8.9–10.3)
CO2: 31 mmol/L (ref 22–32)
CREATININE: 1.2 mg/dL — AB (ref 0.44–1.00)
Chloride: 101 mmol/L (ref 98–111)
GFR calc non Af Amer: 41 mL/min — ABNORMAL LOW (ref >60)
GFR, EST AFRICAN AMERICAN: 48 mL/min — AB (ref >60)
Glucose, Bld: 201 mg/dL — ABNORMAL HIGH (ref 70–99)
Potassium: 4.3 mmol/L (ref 3.5–5.1)
Sodium: 139 mmol/L (ref 135–145)

## 2017-12-05 LAB — CBC
HCT: 27.7 % — ABNORMAL LOW (ref 36.0–46.0)
Hemoglobin: 8.2 g/dL — ABNORMAL LOW (ref 12.0–15.0)
MCH: 21.4 pg — AB (ref 26.0–34.0)
MCHC: 29.6 g/dL — ABNORMAL LOW (ref 30.0–36.0)
MCV: 72.3 fL — ABNORMAL LOW (ref 80.0–100.0)
NRBC: 0.8 % — AB (ref 0.0–0.2)
Platelets: 138 10*3/uL — ABNORMAL LOW (ref 150–400)
RBC: 3.83 MIL/uL — AB (ref 3.87–5.11)
RDW: 20.1 % — AB (ref 11.5–15.5)
WBC: 4 10*3/uL (ref 4.0–10.5)

## 2017-12-05 MED ORDER — FUROSEMIDE 40 MG PO TABS
40.0000 mg | ORAL_TABLET | Freq: Two times a day (BID) | ORAL | Status: DC
  Administered 2017-12-05 – 2017-12-06 (×3): 40 mg via ORAL
  Filled 2017-12-05 (×3): qty 1

## 2017-12-05 NOTE — Progress Notes (Signed)
PROGRESS NOTE                                                                                                                                                                                                             Patient Demographics:    Lauren Crosby, is a 84 y.o. female, DOB - 1931-12-14, CVE:[id number]  Admit date - 11/26/2017   Admitting Physician Ivor Costa, MD  Outpatient Primary MD for the patient is [name], MD  LOS - 8   Chief Complaint  Patient presents with  . Altered Mental Status       Brief Narrative     84 y.o. female with medical history significant of hypertension, hyperlipidemia, diabetes mellitus, GERD, gout, iron deficiency anemia, diverticulitis, kidney stone, thalassemia minor, with recent hospitalization from 10/22-11/8 due to septic shock which was possibly due to C. difficile colitis. Pt also had worsening renal function and required CRRT in ICU. Her kidney function has improved.  Her creatinine was 1.6 on 11/8. Pt was discharged on Lasix 40 mg daily to nursing home for rehab as stable condition. -Patient presents with confusion, fever, she did have episode of seizures as well, and decompensated respiratory failure where she was on BiPAP.   Subjective:   Patient in bed, appears comfortable, denies any headache, no fever, no chest pain or pressure, no shortness of breath , no abdominal pain. No focal weakness.    Assessment  & Plan :   Sepsis - she presented with fever tachycardia, elevated lactic acid and acute metabolic/toxic encephalopathy.  CT abdomen and pelvis showed left ureter punctate stones with proximal hydronephrosis, hydroureter and gas in the bladder.  Although her cultures were negative but there was clinical signs and symptoms of infection, sepsis pathophysiology has resolved, all cultures are negative, all antibiotics were stopped on 12/02/2017.  Acute hypercapnic respiratory failure  -  caused by acute on chronic diastolic CHF EF of 02% on echocardiogram, initially requiring BiPAP, he has been adequately diuresed, now placed on low-dose oral Lasix 40 BID.  Seizures  - by neurology, appears to be seizure-free, switch to oral Keppra and monitor.  Acute metabolic/Toxic encephalopathy:  - Due to combination of sepsis, hypercapnia and possibly seizures, CT head negative, MRI nonacute, ammonia stable, ABG suggested hypercapnia as well along with sepsis.  Has  been seen by neurology, with supportive care her encephalopathy has improved moderately, remains at risk for delirium, minimize narcotics and benzodiazepines, use Haldol for delirium.  Note she was recently admitted few weeks ago and had severe delirium at that time as well.  He is to have very poor baseline functional status and long-term prognosis remains guarded and Pall care is following for [place].  Explained to daughter and husband in detail.  Advance activity, PT OT eval.  Depression: - Hold all home oral medications until mental status improves  HTN (hypertension):  - IV hydralazine PRN  GERD  - IV pepcid  Iron deficiency anemia:  - Received IV iron this admission, continue oral iron supplementation upon discharge.  Defer further work-up to PCP as age-appropriate.   CKD (chronic kidney disease), stage IV ([place]): recent cre 1.6 on 11/8-->1.43 today. BUN 27.   Urinary retention.  Failed Foley removal, replace Foley and will be discharged on it, Flomax, outpatient urology follow-up.    C. difficile diarrhea: no diarrhea currently - Resolved, will try to minimize antibiotic exposure.  Stop Rocephin on 12/02/2017.  Hypokalemia and hypomagnesemia- both replaced will continue to monitor.  Sacral decubitus ulcer present on admission.  Wound care nurse.  Kindly review RN notes for details.    DM2 with renal manifestations ([place]): Last A1c 6.7 on 05/24/17, well controled.  Currently on Lantus and sliding scale continue to  monitor and adjust.    CBG (last 3)  Recent Labs    12/04/17 1705 12/04/17 2007 12/05/17 0829  GLUCAP 262* 232* 191*      Code Status : DNR  Family Communication  : I discussed with daughter over the phone in detail on 12/01/2017 and again 12/03/17, husband bedside 12/03/17  Disposition Plan  : HHPT  Consults  :  PCCM, neurology, Pall. Care  Procedures  :    TTE -  Left ventricle: The cavity size was normal. Wall thickness was increased in a pattern of mild LVH. Systolic function was vigorous. The estimated ejection fraction was in the range of 65% to 70%. Wall motion was normal; there were no regional wall motion abnormalities. Doppler parameters are consistent with abnormal left ventricular relaxation (grade 1 diastolic  dysfunction). Doppler parameters are consistent with high ventricular filling pressure. - Ventricular septum: Septal motion showed abnormal function and dyssynergy. - Aortic valve: Peak velocity (S): 205 cm/s. - Mitral valve: Moderately calcified annulus. - Left atrium: The atrium was moderately dilated.  DVT Prophylaxis  :  Heparin  Lab Results  Component Value Date   PLT 138 (L) 12/05/2017    Antibiotics  :    Anti-infectives (From admission, onward)   Start     Dose/Rate Route Frequency Ordered Stop   12/01/17 1000  cefTRIAXone (ROCEPHIN) 1 g in sodium chloride 0.9 % 100 mL IVPB     1 g 200 mL/hr over 30 Minutes Intravenous Every 24 hours 12/01/17 0951 12/02/17 1130   11/30/17 1700  cefTRIAXone (ROCEPHIN) 2 g in sodium chloride 0.9 % 100 mL IVPB  Status:  Discontinued     2 g 200 mL/hr over 30 Minutes Intravenous Every 24 hours 11/30/17 1148 12/01/17 0941   11/29/17 2200  vancomycin (VANCOCIN) 1,500 mg in sodium chloride 0.9 % 500 mL IVPB  Status:  Discontinued     1,500 mg 250 mL/hr over 120 Minutes Intravenous Every 48 hours 11/28/17 1418 11/29/17 1242   11/28/17 1800  ceFEPIme (MAXIPIME) 1 g in sodium chloride 0.9 % 100 mL  IVPB  Status:   Discontinued     1 g 200 mL/hr over 30 Minutes Intravenous Every 24 hours 11/28/17 1418 11/30/17 1148   11/28/17 0000  vancomycin (VANCOCIN) 1,250 mg in sodium chloride 0.9 % 250 mL IVPB  Status:  Discontinued     1,250 mg 166.7 mL/hr over 90 Minutes Intravenous Every 24 hours 11/27/17 0159 11/28/17 1418   11/27/17 1800  ceFEPIme (MAXIPIME) 2 g in sodium chloride 0.9 % 100 mL IVPB  Status:  Discontinued     2 g 200 mL/hr over 30 Minutes Intravenous Every 24 hours 11/27/17 1502 11/28/17 1418   11/27/17 0200  metroNIDAZOLE (FLAGYL) IVPB 500 mg  Status:  Discontinued     500 mg 100 mL/hr over 60 Minutes Intravenous Every 8 hours 11/27/17 0126 12/01/17 0941   11/27/17 0200  aztreonam (AZACTAM) 1 g in sodium chloride 0.9 % 100 mL IVPB  Status:  Discontinued     1 g 200 mL/hr over 30 Minutes Intravenous Every 8 hours 11/27/17 0159 11/27/17 1502   11/27/17 0200  vancomycin (VANCOCIN) 2,000 mg in sodium chloride 0.9 % 500 mL IVPB     2,000 mg 250 mL/hr over 120 Minutes Intravenous  Once 11/27/17 0159 11/27/17 0606   11/26/17 2200  cefTRIAXone (ROCEPHIN) 2 g in sodium chloride 0.9 % 100 mL IVPB  Status:  Discontinued     2 g 200 mL/hr over 30 Minutes Intravenous Every 24 hours 11/26/17 2140 11/27/17 0125        Objective:   Vitals:   12/04/17 1350 12/04/17 2014 12/05/17 0500 12/05/17 0541  BP: (!) 129/51 (!) 146/54  (!) 132/47  Pulse: 77 81  77  Resp:  (!) 24  (!) 24  Temp: 98.4 F (36.9 C) 98 F (36.7 C)  97.6 F (36.4 C)  TempSrc: Oral Oral  Oral  SpO2: 98% 97%  95%  Weight:   102.6 kg   Height:        Wt Readings from Last 3 Encounters:  12/05/17 102.6 kg  11/22/17 109.8 kg  11/19/17 109.8 kg     Intake/Output Summary (Last 24 hours) at 12/05/2017 1100 Last data filed at 12/05/2017 0533 Gross per 24 hour  Intake -  Output 2050 ml  Net -2050 ml     Physical Exam  Awake remains pleasantly confused, no focal deficits, Foley in place, constantly  moans [place].AT,PERRAL Supple Neck,No JVD, No cervical lymphadenopathy appriciated.  Symmetrical Chest wall movement, Good air movement bilaterally, CTAB RRR,No Gallops, Rubs or new Murmurs, No Parasternal Heave +ve B.Sounds, Abd Soft, No tenderness, No organomegaly appriciated, No rebound - guarding or rigidity. No Cyanosis, Clubbing or edema, No new Rash or bruise    Data Review:    CBC Recent Labs  Lab 11/29/17 0256 11/30/17 0401 12/01/17 0448 12/02/17 0431 12/05/17 0655  WBC 4.5 3.4* 3.5* 3.9* 4.0  HGB 8.0* 8.1* 7.7* 7.9* 8.2*  HCT 27.0* 27.0* 26.1* 26.9* 27.7*  PLT 104* 100* 109* 90* 138*  MCV 74.8* 73.8* 73.9* 72.7* 72.3*  MCH 22.2* 22.1* 21.8* 21.4* 21.4*  MCHC 29.6* 30.0 29.5* 29.4* 29.6*  RDW 20.7* 20.2* 20.2* 19.9* 20.1*    Chemistries  Recent Labs  Lab 12/01/17 0448 12/02/17 0431 12/03/17 0444 12/04/17 0810 12/05/17 0655  NA 144 141 140 139 139  K 3.2* 3.2* 3.4* 4.2 4.3  CL 106 105 103 104 101  CO2 29 27 29 28 31   GLUCOSE 225* 204* 199* 193* 201*  BUN 28* 23  21 19 18   CREATININE 1.39* 1.22* 1.21* 1.39* 1.20*  CALCIUM 8.1* 8.4* 8.4* 8.8* 9.1  MG  --  1.4* 1.7  --   --    ------------------------------------------------------------------------------------------------------------------ No results for input(s): CHOL, HDL, LDLCALC, TRIG, CHOLHDL, LDLDIRECT in the last 72 hours.  Lab Results  Component Value Date   HGBA1C 6.7 (H) 05/24/2017   ------------------------------------------------------------------------------------------------------------------ No results for input(s): TSH, T4TOTAL, T3FREE, THYROIDAB in the last 72 hours.  Invalid input(s): FREET3 ------------------------------------------------------------------------------------------------------------------ No results for input(s): VITAMINB12, FOLATE, FERRITIN, TIBC, IRON, RETICCTPCT in the last 72 hours.  Coagulation profile No results for input(s): INR, PROTIME in the last 168  hours.  No results for input(s): DDIMER in the last 72 hours.  Cardiac Enzymes No results for input(s): CKMB, TROPONINI, MYOGLOBIN in the last 168 hours.  Invalid input(s): CK ------------------------------------------------------------------------------------------------------------------    Component Value Date/Time   BNP 859.9 (H) 11/27/2017 0327   BNP 18.5 08/14/2012 0930    Inpatient Medications  Scheduled Meds: . chlorhexidine  15 mL Mouth Rinse BID  . famotidine  20 mg Oral QHS  . heparin  5,000 Units Subcutaneous Q8H  . hydrocerin   Topical BID  . insulin aspart  0-5 Units Subcutaneous QHS  . insulin aspart  0-9 Units Subcutaneous TID WC  . insulin glargine  7 Units Subcutaneous QHS  . levETIRAcetam  1,000 mg Oral BID  . mouth rinse  15 mL Mouth Rinse q12n4p  . tamsulosin  0.4 mg Oral Daily   Continuous Infusions: . sodium chloride Stopped (12/01/17 1519)   PRN Meds:.sodium chloride, acetaminophen **OR** [DISCONTINUED] acetaminophen, cyclobenzaprine, haloperidol lactate, hydrALAZINE, HYDROcodone-acetaminophen, [DISCONTINUED] ondansetron **OR** ondansetron (ZOFRAN) IV  Micro Results Recent Results (from the past 240 hour(s))  Blood Culture (routine x 2)     Status: None   Collection Time: 11/26/17  9:52 PM  Result Value Ref Range Status   Specimen Description BLOOD RIGHT HAND  Final   Special Requests   Final    BOTTLES DRAWN AEROBIC AND ANAEROBIC Blood Culture adequate volume   Culture   Final    NO GROWTH 5 DAYS Performed at [facility], [place] [address], [place], [place] [address]    Report Status 12/01/2017 FINAL  Final  Urine culture     Status: None   Collection Time: 11/26/17 10:37 PM  Result Value Ref Range Status   Specimen Description URINE, CATHETERIZED  Final   Special Requests NONE  Final   Culture   Final    NO GROWTH Performed at [facility], [place] [address], [place], [place] [address]    Report Status 11/28/2017 FINAL  Final   Blood Culture (routine x 2)     Status: None   Collection Time: 11/26/17 11:39 PM  Result Value Ref Range Status   Specimen Description BLOOD LEFT FOREARM  Final   Special Requests   Final    BOTTLES DRAWN AEROBIC AND ANAEROBIC Blood Culture adequate volume   Culture   Final    NO GROWTH 5 DAYS Performed at [facility], [place] [address], [place], [place] [address]    Report Status 12/02/2017 FINAL  Final  Respiratory Panel by PCR     Status: None   Collection Time: 11/27/17  1:38 PM  Result Value Ref Range Status   Adenovirus NOT DETECTED NOT DETECTED Final   Coronavirus 229E NOT DETECTED NOT DETECTED Final   Coronavirus HKU1 NOT DETECTED NOT DETECTED Final   Coronavirus NL63 NOT DETECTED  NOT DETECTED Final   Coronavirus OC43 NOT DETECTED NOT DETECTED Final   Metapneumovirus NOT DETECTED NOT DETECTED Final   Rhinovirus / Enterovirus NOT DETECTED NOT DETECTED Final   Influenza A NOT DETECTED NOT DETECTED Final   Influenza B NOT DETECTED NOT DETECTED Final   Parainfluenza Virus 1 NOT DETECTED NOT DETECTED Final   Parainfluenza Virus 2 NOT DETECTED NOT DETECTED Final   Parainfluenza Virus 3 NOT DETECTED NOT DETECTED Final   Parainfluenza Virus 4 NOT DETECTED NOT DETECTED Final   Respiratory Syncytial Virus NOT DETECTED NOT DETECTED Final   Bordetella pertussis NOT DETECTED NOT DETECTED Final   Chlamydophila pneumoniae NOT DETECTED NOT DETECTED Final   Mycoplasma pneumoniae NOT DETECTED NOT DETECTED Final    Comment: Performed at [facility], [place] [address], [place], [place] [address]    Radiology Reports Ct Abdomen Pelvis Wo Contrast  Result Date: 11/27/2017 CLINICAL DATA:  Abdominal distention, altered mental status, sepsis. History of Clostridium difficile colitis, open wound of the abdominal wall, nephrolithiasis, hernia, gastroesophageal reflux disease, diverticulitis, colon polyp, cellulitis and abscess, adrenal adenoma, ventral hernia repair, hysterectomy,  sigmoid resection, cholecystectomy, appendectomy. EXAM: CT ABDOMEN AND PELVIS WITHOUT CONTRAST TECHNIQUE: Multidetector CT imaging of the abdomen and pelvis was performed following the standard protocol without IV contrast. COMPARISON:  10/30/2017 FINDINGS: Lower chest: Evaluation is limited due to motion artifact. Atelectasis or consolidation in the lung bases, greater on the left. Coronary artery calcifications. Calcification in the mitral valve annulus. Hepatobiliary: No focal liver abnormality is seen. Status post cholecystectomy. No biliary dilatation. Pancreas: Fatty infiltration of the pancreas. Spleen: Normal in size without focal abnormality. Adrenals/Urinary Tract: 3.1 cm diameter left adrenal gland nodule. No change since previous study. Fat attenuation consistent with benign adenoma. Bilateral renal parenchymal atrophy. Mild left hydronephrosis and hydroureter with tiny punctate stones suggested in the distal left ureter. Bladder wall is not thickened. Gas in the bladder may come from instrumentation or infection. Exophytic lesion arising from the right kidney without change since prior study. Hyperdensity likely indicating a hemorrhagic cyst. Stomach/Bowel: Stomach, small bowel, and colon are not abnormally distended. Scattered stool throughout the colon. Scattered colonic diverticula. No evidence of diverticulitis. Prominent stool in the rectum with mild rectal wall thickening may indicate stercoral colitis. Infiltration in the presacral fat is unchanged since prior study, possibly inflammatory or reactive. Vascular/Lymphatic: Aortic atherosclerosis. No enlarged abdominal or pelvic lymph nodes. Reproductive: Status post hysterectomy. No adnexal masses. Other: Small amount of free fluid around the liver and spleen. Likely ascites. Scarring in the anterior abdominal wall consistent with postoperative change. Edema in the subcutaneous fat. No free air in the abdomen. Musculoskeletal: Degenerative changes  in the spine. No destructive bone lesions. IMPRESSION: 1. Atelectasis or consolidation in the lung bases, greater on the left. 2. Left adrenal gland adenoma. 3. Bilateral renal parenchymal atrophy. Punctate stones suggested in the distal left ureter. Mild proximal hydronephrosis and hydroureter. Gas in the bladder may be due to instrumentation or infection. 4. Prominent stool in the rectum with rectal wall thickening suggesting possible stercoral colitis. 5. Small amount of free fluid in the abdomen and pelvis, likely ascites. 6. Probable hemorrhagic cysts in the right kidney. Aortic Atherosclerosis (ICD10-I70.0). Electronically Signed   By: Lucienne Capers M.D.   On: 11/27/2017 03:10   Dg Abd 1 View  Result Date: 11/09/2017 CLINICAL DATA:  Patient pulled out nasogastric catheter EXAM: ABDOMEN - 1 VIEW COMPARISON:  11/07/2017 FINDINGS: Scattered large and small bowel gas is noted.  The previously seen nasogastric catheter has been removed in the interval. Despite the given clinical history of metal tipped catheter this catheter on previous images represented a standard nasogastric catheter without metallic weighting. No foreign body is noted. IMPRESSION: No evidence of retained foreign body as the prior nasogastric catheter was not a weighted tipped catheter based on previous images. Electronically Signed   By: Inez Catalina M.D.   On: 11/09/2017 19:57   Dg Abd 1 View  Result Date: 11/07/2017 CLINICAL DATA:  84 year old female with feeding tube placement. EXAM: ABDOMEN - 1 VIEW COMPARISON:  Earlier abdominal radiograph dated 11/07/2017 FINDINGS: No significant change in the appearance or positioning of the enteric tube which appears somewhat folded, likely in the mid to distal stomach. IMPRESSION: No significant interval change. Electronically Signed   By: Anner Crete M.D.   On: 11/07/2017 23:00   Ct Head Wo Contrast  Result Date: 11/27/2017 CLINICAL DATA:  Altered mental status. History of  hypertension, hepatitis, diabetes, edema. EXAM: CT HEAD WITHOUT CONTRAST TECHNIQUE: Contiguous axial images were obtained from the base of the skull through the vertex without intravenous contrast. COMPARISON:  MRI brain 11/29/2016. CT head 11/24/2016. FINDINGS: Brain: Diffuse cerebral atrophy. Ventricular dilatation consistent with central atrophy. Low-attenuation changes throughout the deep white matter consistent with small vessel ischemia. No mass-effect or midline shift. No abnormal extra-axial fluid collections. Gray-white matter junctions are distinct. Basal cisterns are not effaced. No acute intracranial hemorrhage. Vascular: Moderate intracranial arterial vascular calcifications are present. Skull: Calvarium appears intact. Sinuses/Orbits: Paranasal sinuses and mastoid air cells are clear. Other: None. IMPRESSION: No acute intracranial abnormalities. Chronic atrophy and small vessel ischemic changes. Electronically Signed   By: Lucienne Capers M.D.   On: 11/27/2017 03:02   Mr Brain Wo Contrast  Result Date: 11/30/2017 CLINICAL DATA:  Initial evaluation for acute altered mental status, seizure, unclear cause. EXAM: MRI HEAD WITHOUT CONTRAST TECHNIQUE: Multiplanar, multiecho pulse sequences of the brain and surrounding structures were obtained without intravenous contrast. COMPARISON:  Prior CT from 11/27/2017. FINDINGS: Brain: Diffuse prominence of the CSF containing spaces compatible with generalized age-related cerebral atrophy, relatively stable in appearance as compared to most recent MRI from 11/29/2016. There has been interval development of extensive T2/FLAIR signal abnormality throughout the supratentorial and infratentorial cerebral white matter, involving both cerebral hemispheres in a fairly symmetric pattern. Slight posterior predominance involving the parieto-occipital regions noted. Involvement of both internal and external capsules. Patchy infratentorial involvement involving the  bilateral cerebellar hemispheres. Suggestion of associated mild edema as a few cortical gyri appear slightly expanded as compared to previous exam (series 10, image 20). Sparing of the overlying cortical gray matter which is relatively normal in appearance. Findings are nonspecific. Superimposed chronic microvascular ischemic changes noted within the pons, otherwise similar to previous. No abnormal foci of restricted diffusion to suggest acute or subacute ischemia. No encephalomalacia to suggest chronic cortical infarction. No foci of susceptibility artifact to suggest acute or chronic intracranial hemorrhage. No mass lesion, midline shift or mass effect. Stable ventricular prominence related global parenchymal volume loss without hydrocephalus. No extra-axial fluid collection. Pituitary gland normal. Midline structures intact. Vascular: Major intracranial vascular flow voids are maintained. Skull and upper cervical spine: Craniocervical junction within normal limits. No focal marrow replacing lesion. Scalp soft tissues unremarkable. Sinuses/Orbits: Patient status post ocular lens replacement bilaterally. Paranasal sinuses are clear. Small right mastoid effusion noted. Other: None. IMPRESSION: Extensive abnormal T2/FLAIR signal abnormality throughout the cerebral white matter as above, new relative to most recent  MRI from 11/29/2016. Findings are nonspecific, with primary differential considerations including PRES versus toxic/metabolic derangement. Anoxic brain injury felt to be unlikely given the relative sparing of the gray matter structures. Correlation with laboratory values and history recommended. Electronically Signed   By: Jeannine Boga M.D.   On: 11/30/2017 14:18   Dg Chest Port 1 View  Result Date: 12/02/2017 CLINICAL DATA:  Shortness of breath EXAM: PORTABLE CHEST 1 VIEW COMPARISON:  11/30/2017 and prior exams FINDINGS: Cardiomegaly and pulmonary vascular congestion again noted. LEFT LOWER  lung consolidation/atelectasis and mild RIGHT basilar atelectasis again noted. No pneumothorax. There has been little interval change since prior studies IMPRESSION: Unchanged appearance of the chest with continued LEFT LOWER lung consolidation/atelectasis and mild RIGHT basilar atelectasis. Electronically Signed   By: Margarette Canada M.D.   On: 12/02/2017 08:12   Dg Chest Port 1 View  Result Date: 11/30/2017 CLINICAL DATA:  Shortness of breath, hypertension EXAM: PORTABLE CHEST 1 VIEW COMPARISON:  11/28/2017 FINDINGS: Cardiomegaly with vascular congestion and probable mild pulmonary edema. Findings are similar prior study. Suspect small layering effusions. No acute bony abnormality. IMPRESSION: Cardiomegaly with mild pulmonary edema and small layering effusions. No real change since prior study. Electronically Signed   By: Rolm Baptise M.D.   On: 11/30/2017 08:54   Dg Chest Port 1 View  Result Date: 11/28/2017 CLINICAL DATA:  Hypoxia. EXAM: PORTABLE CHEST 1 VIEW COMPARISON:  Radiograph November 26, 2017. FINDINGS: Stable cardiomegaly is noted with central pulmonary vascular congestion. No pneumothorax or pleural effusion is noted. Possible mild bilateral pulmonary edema may be present. Bony thorax is unremarkable. IMPRESSION: Stable cardiomegaly with central pulmonary vascular congestion and possible mild bilateral pulmonary edema. Electronically Signed   By: Marijo Conception, M.D.   On: 11/28/2017 12:55   Dg Chest Port 1 View  Result Date: 11/26/2017 CLINICAL DATA:  Altered mental status EXAM: PORTABLE CHEST 1 VIEW COMPARISON:  11/13/2017 FINDINGS: AP portable semi upright view of the chest. The patient is rotated to the right. Stable cardiomegaly with interstitial edema. No pulmonary confluence, effusion or pneumothorax. No acute osseous abnormality. IMPRESSION: Cardiomegaly with interstitial edema.  No significant change. Electronically Signed   By: Ashley Royalty M.D.   On: 11/26/2017 22:48   Dg Chest  Port 1 View  Result Date: 11/13/2017 CLINICAL DATA:  Shortness of breath EXAM: PORTABLE CHEST 1 VIEW COMPARISON:  11/09/2017 FINDINGS: Cardiomegaly and slightly increased pulmonary vascular congestion noted. Mild bibasilar opacities/atelectasis noted in this low volume film. A RIGHT central venous catheter with tips overlying the LOWER SVC/SUPERIOR cavoatrial junction noted. No pneumothorax. IMPRESSION: Cardiomegaly with pulmonary vascular congestion and mild bibasilar opacities/atelectasis. Electronically Signed   By: Margarette Canada M.D.   On: 11/13/2017 07:49   Dg Chest Port 1 View  Result Date: 11/09/2017 CLINICAL DATA:  Patient removed recent nasogastric catheter EXAM: PORTABLE CHEST 1 VIEW COMPARISON:  11/08/2017 FINDINGS: Cardiac shadow is enlarged. Right jugular central line is again seen and stable. Inspiratory effort is poor with crowding of the vascular markings. No focal confluent infiltrate is seen. No radiopaque foreign body is noted. The previously seen nasogastric catheter has been removed and as previously described was not a weighted tipped catheter. IMPRESSION: Poor inspiratory effort.  No acute abnormality noted. Electronically Signed   By: Inez Catalina M.D.   On: 11/09/2017 19:58   Dg Chest Port 1 View  Result Date: 11/08/2017 CLINICAL DATA:  Respiratory failure. EXAM: PORTABLE CHEST 1 VIEW COMPARISON:  11/07/2017 and older exams. FINDINGS:  Mild enlargement of the cardiopericardial silhouette, stable. Prominent bronchovascular markings, with additional lung base opacity, the latter consistent with atelectasis, also without change from the previous day's exam. New nasal/orogastric tube passes below the included field of view, likely into the stomach. Right subclavian dual lumen central venous catheter is stable, tip in the right atrium. No pneumothorax. IMPRESSION: 1. No significant change in lung aeration from the previous day's study. There are prominent bronchovascular markings and lung  base atelectasis, but no convincing pulmonary edema or pneumonia. 2. New nasal/orogastric tube. Tip not visualized, but likely extends into the stomach. Electronically Signed   By: Lajean Manes M.D.   On: 11/08/2017 07:13   Dg Chest Port 1 View  Result Date: 11/07/2017 CLINICAL DATA:  Acute respiratory failure with hypoxia. EXAM: PORTABLE CHEST 1 VIEW COMPARISON:  10/31/2017. FINDINGS: Cardiomegaly. Improved lung volumes. Increased perihilar markings could represent early edema or viral pneumonitis. Other than improved lung markings, similar appearance to priors. Dialysis catheter has been inserted via RIGHT IJ approach. Tip slide in the RIGHT atrium. There is no pneumothorax. IMPRESSION: Satisfactory catheter placement. No significant change in the appearance of the lungs except for improved volume. Cardiomegaly. Electronically Signed   By: Staci Righter M.D.   On: 11/07/2017 11:30   Dg Abd Portable 1v  Result Date: 11/07/2017 CLINICAL DATA:  Nasogastric tube placement. EXAM: PORTABLE ABDOMEN - 1 VIEW COMPARISON:  11/03/2017 FINDINGS: Interval nasogastric tube folded back upon itself in the mid to distal stomach with its tip in the mid stomach. The included bowel gas pattern is normal. Prominent interstitial markings at the left lung base. Lower thoracic spine degenerative changes. IMPRESSION: Nasogastric tube folded back upon itself in the mid to distal stomach with its tip in the mid stomach. Electronically Signed   By: Claudie Revering M.D.   On: 11/07/2017 13:13     Signature  Lala Lund M.D on 12/05/2017 at 11:00 AM   -  To page go to [email or web address] - password [email or web address]

## 2017-12-05 NOTE — Progress Notes (Signed)
    Durable Medical Equipment  (From admission, onward)         Start     Ordered   12/05/17 0740  For home use only DME Hospital bed  Once    Question Answer Comment  Patient has (list medical condition): Metabolic encephalopathy, hypertension, hyperlipidemia, diabetes mellitus, GERD, gout, iron deficiency anemia, diverticulitis, kidney stone, thalassemia minor,   The above medical condition requires: Patient requires the ability to reposition frequently   Head must be elevated greater than: 30 degrees   Bed type Semi-electric   Hoyer Lift Yes      12/05/17 0740

## 2017-12-05 NOTE — Care Management Note (Addendum)
Case Management Note  Patient Details  Name: Lauren Crosby MRN: [id number] Date of Birth: 24-May-1931  Subjective/Objective:     Sepsis. Hx of hypertension, hyperlipidemia, diabetes mellitus, GERD, gout, iron deficiency anemia, diverticulitis, kidney stone, thalassemia minor, with recent hospitalization from 10/22-11/8 due to septic shock. From SNF/ [facility].        [address] (Spouse) Lauren Crosby (Daughter)       [phone]         12/05/2017 [facility] to provide home palliative care, referral made with [place] , [phone].   11/27 0920  NCM Spoke with pt's husband regarding d/c disposition. Husband confirmed plan home with home health services. Choice given. [facility] selected to provide [facility] services.  PCP: [name]  Action/Plan: Home with home health services and palliative care to follow. Husband declined SNF. States he and his children will assist with caring for pt.  NCM to f/u with husband regarding TOC needs ( home health agency,palliative care and DME: hospital bed.  Pt will need [facility] called for transportation to home once d/c.  Expected Discharge Date:   12/06/2017            Expected Discharge Plan:  [place], declined returning back to SNF  In-House Referral:  Clinical Social Work  Discharge planning Services  CM Consult  Post Acute Care Choice:    Choice offered to:   spouse  DME Arranged:  Hospital bed, hoyer lift, BSC, tray table   DME Agency:  [facility] Arranged:   PT,RN,NA,SW [place]  Status of Service:  completed  If discussed at [place] of Stay Meetings, dates discussed:    Additional Comments: 12/05/2017 @ 0740 NCM consult received:Husband now requesting to take her home with home health/PT. Recommend outpatient palliative f/u. Husband considering hospital bed at home. Please discuss with him. Thanks. NCM called via phone to speak with husband regarding TOC  needs...call went to voicemail . Message left by NCM , awaiting return call from husband.   Whitman Hero Gray, RN 12/05/2017, 7:20 AM

## 2017-12-06 ENCOUNTER — Inpatient Hospital Stay (HOSPITAL_COMMUNITY): Payer: Medicare HMO

## 2017-12-06 LAB — GLUCOSE, CAPILLARY: GLUCOSE-CAPILLARY: 207 mg/dL — AB (ref 70–99)

## 2017-12-06 MED ORDER — FUROSEMIDE 40 MG PO TABS: 40.0000 mg | ORAL_TABLET | Freq: Two times a day (BID) | ORAL | 0 refills | Status: AC

## 2017-12-06 MED ORDER — SIMVASTATIN 80 MG PO TABS: 80.0000 mg | ORAL_TABLET | Freq: Every day | ORAL | 0 refills | Status: AC

## 2017-12-06 MED ORDER — "INSULIN SYRINGE-NEEDLE U-100 25G X 1"" 1 ML MISC": 0 refills | Status: AC

## 2017-12-06 MED ORDER — TAMSULOSIN HCL 0.4 MG PO CAPS: 0.4000 mg | ORAL_CAPSULE | Freq: Every day | ORAL | 0 refills | Status: AC

## 2017-12-06 MED ORDER — LEVETIRACETAM 1000 MG PO TABS: 1000.0000 mg | ORAL_TABLET | Freq: Two times a day (BID) | ORAL | 0 refills | Status: AC

## 2017-12-06 MED ORDER — INSULIN GLARGINE 100 UNIT/ML ~~LOC~~ SOLN: 10.0000 [IU] | Freq: Every day | SUBCUTANEOUS | 0 refills | Status: AC

## 2017-12-06 MED ORDER — QUETIAPINE FUMARATE 50 MG PO TABS: 50.0000 mg | ORAL_TABLET | Freq: Every day | ORAL | 0 refills | Status: DC

## 2017-12-06 MED ORDER — RANITIDINE HCL 150 MG/10ML PO SYRP: 75.0000 mg | ORAL_SOLUTION | Freq: Two times a day (BID) | ORAL | 0 refills | Status: AC

## 2017-12-06 MED ORDER — GUAIFENESIN-DM 100-10 MG/5ML PO SYRP: 5.0000 mL | ORAL_SOLUTION | Freq: Four times a day (QID) | ORAL | 0 refills | Status: AC | PRN

## 2017-12-06 MED ORDER — SUCRALFATE 1 G PO TABS: 1.0000 g | ORAL_TABLET | Freq: Three times a day (TID) | ORAL | 0 refills | Status: AC

## 2017-12-06 MED ORDER — METOPROLOL TARTRATE 25 MG PO TABS: 12.5000 mg | ORAL_TABLET | Freq: Two times a day (BID) | ORAL | 0 refills | Status: AC

## 2017-12-06 MED ORDER — FERROUS SULFATE 325 (65 FE) MG PO TABS: 325.0000 mg | ORAL_TABLET | Freq: Two times a day (BID) | ORAL | 0 refills | Status: AC

## 2017-12-06 MED ORDER — INSULIN ASPART 100 UNIT/ML ~~LOC~~ SOLN: 1.0000 [IU] | SUBCUTANEOUS | 0 refills | Status: AC

## 2017-12-06 MED ORDER — BLOOD GLUCOSE MONITOR KIT: PACK | 1 refills | Status: AC

## 2017-12-06 MED ORDER — QUETIAPINE FUMARATE 50 MG PO TABS: 50.0000 mg | ORAL_TABLET | Freq: Every day | ORAL | 0 refills | Status: AC

## 2017-12-06 NOTE — Care Management (Signed)
1104 12-06-17 Durable Medical Equipment Delivered to home on 12-05-17. CM to call [facility] for transition home. No further needs from CM at this time. Bethena Roys, RN,BSN Case manager [phone]

## 2017-12-06 NOTE — Progress Notes (Signed)
Nsg Discharge Note  Admit Date:  11/26/2017 Discharge date: 12/06/2017   Jamison Oka to be D/C'd Home per MD order.  AVS completed.  Copy for chart, and copy for patient caregiver able to verbalize understanding.  Discharge Medication: Allergies as of 12/06/2017      Reactions   Ativan [lorazepam]    Benadryl [diphenhydramine]    delirium   Penicillins Other (See Comments)   Unknown reaction Has patient had a PCN reaction causing immediate rash, facial/tongue/throat swelling, SOB or lightheadedness with hypotension: No Has patient had a PCN reaction causing severe rash involving mucus membranes or skin necrosis: No Has patient had a PCN reaction that required hospitalization: No Has patient had a PCN reaction occurring within the last 10 years: Unknown If all of the above answers are "NO", then may proceed with Cephalosporin use. Has received ceftriaxone w/o ADE      Medication List    TAKE these medications   blood glucose meter kit and supplies Kit Dispense based on patient and insurance preference. Use up to four times daily as directed. (FOR ICD-9 250.00, 250.01). For QAC - HS accuchecks.   feeding supplement (PRO-STAT SUGAR FREE 64) Liqd Take 30 mLs by mouth 2 (two) times daily between meals.   ferrous sulfate 325 (65 FE) MG tablet Take 1 tablet (325 mg total) by mouth 2 (two) times daily with a meal.   furosemide 40 MG tablet Commonly known as:  LASIX Take 1 tablet (40 mg total) by mouth 2 (two) times daily. What changed:    when to take this  Another medication with the same name was removed. Continue taking this medication, and follow the directions you see here.   guaiFENesin-dextromethorphan 100-10 MG/5ML syrup Commonly known as:  ROBITUSSIN DM Take 5 mLs by mouth every 6 (six) hours as needed for cough. What changed:  when to take this   insulin aspart 100 UNIT/ML injection Commonly known as:  novoLOG Inject 1-9 Units into the skin See admin  instructions. Can switch to any insulin.  Inject 1-9 units subcutaneously four times daily - before meals and at bedtime - per sliding scale: CBG 121-150 1 unit, 151-200 2 units, 201-250 3 units, 251-300 5 units, 301-350 7 units, 351-400 9 units, >400 9 units and call MD What changed:    how much to take  when to take this  additional instructions   insulin glargine 100 UNIT/ML injection Commonly known as:  LANTUS Inject 0.1 mLs (10 Units total) into the skin at bedtime. Dispense pen if approved. What changed:  additional instructions   Insulin Syringe-Needle U-100 25G X 1" 1 ML Misc For 4 times a day insulin SQ, 1 month supply. Diagnosis E11.65   ipratropium-albuterol 0.5-2.5 (3) MG/3ML Soln Commonly known as:  DUONEB Take 3 mLs by nebulization every 4 (four) hours as needed. What changed:  reasons to take this   levETIRAcetam 1000 MG tablet Commonly known as:  KEPPRA Take 1 tablet (1,000 mg total) by mouth 2 (two) times daily.   metoprolol tartrate 25 MG tablet Commonly known as:  LOPRESSOR Take 0.5 tablets (12.5 mg total) by mouth 2 (two) times daily.   multivitamin with minerals Tabs tablet Take 1 tablet by mouth daily.   OXYGEN Inhale 2 L into the lungs continuous.   PRESERVISION AREDS 2 Caps Take 1 capsule by mouth daily.   QUEtiapine 50 MG tablet Commonly known as:  SEROQUEL Take 1 tablet (50 mg total) by mouth at bedtime. What  changed:    medication strength  how much to take  when to take this   ranitidine 150 MG/10ML syrup Commonly known as:  ZANTAC Take 5 mLs (75 mg total) by mouth 2 (two) times daily.   simvastatin 80 MG tablet Commonly known as:  ZOCOR Take 1 tablet (80 mg total) by mouth at bedtime.   sucralfate 1 g tablet Commonly known as:  CARAFATE Take 1 tablet (1 g total) by mouth 4 (four) times daily -  with meals and at bedtime. What changed:  when to take this   tamsulosin 0.4 MG Caps capsule Commonly known as:  FLOMAX Take 1  capsule (0.4 mg total) by mouth daily. Start taking on:  12/07/2017            Durable Medical Equipment  (From admission, onward)         Start     Ordered   12/05/17 1227  For home use only DME Other see comment  Once    Comments:  Bedside tray table   12/05/17 1227   12/05/17 1226  For home use only DME 3 n 1  Once     12/05/17 1227   12/05/17 1040  For home use only DME Other see comment  Once    Comments:  Hoyer lift   12/05/17 1040   12/05/17 0740  For home use only DME Hospital bed  Once    Question Answer Comment  Patient has (list medical condition): Metabolic encephalopathy, hypertension, hyperlipidemia, diabetes mellitus, GERD, gout, iron deficiency anemia, diverticulitis, kidney stone, thalassemia minor,   The above medical condition requires: Patient requires the ability to reposition frequently   Head must be elevated greater than: 30 degrees   Bed type Semi-electric   Hoyer Lift Yes      12/05/17 0740          Discharge Assessment: Vitals:   12/05/17 2039 12/06/17 0512  BP: (!) 135/49 134/87  Pulse: 79 81  Resp: (!) 24 (!) 32  Temp: 98.1 F (36.7 C) 98.6 F (37 C)  SpO2: 94% 94%   D/c Instructions-Education: Discharge instructions given to family with verbalized understanding. D/c education completed with family including follow up instructions, medication list, d/c activities limitations if indicated, with other d/c instructions as indicated by MD - Family able to verbalize understanding, all questions fully answered. Family instructed to return to ED, call 911, or call MD for any changes in condition.  Patient transported home via [place].  [place] N Takeysha Bonk, RN 12/06/2017 1:29 PM

## 2017-12-06 NOTE — Discharge Summary (Signed)
Lauren Crosby KGM:[id number] DOB: [birth year] DOA: 11/26/2017  PCP: [name], MD  Admit date: 11/26/2017  Discharge date: 12/06/2017  Admitted From: Home   Disposition:  Home - refused SNF   Recommendations for Outpatient Follow-up:   Follow up with PCP in 1-2 weeks  PCP Please obtain BMP/CBC, 2 view CXR in 1week,  (see Discharge instructions)   PCP Please follow up on the following pending results: None   Home Health: PT,RN,Aide, S Work   Equipment/Devices: Human resources officer.Bed, [employer], bedside commode,  Consultations: Pall Care Discharge Condition: Guarded   CODE STATUS: DNF   Diet Recommendation: Soft Heart Healthy Low Carb   Chief Complaint  Patient presents with  . Altered Mental Status     Brief history of present illness from the day of admission and additional interim summary    84 y.o.femalewith medical history significant ofhypertension, hyperlipidemia, diabetes mellitus, GERD, gout, iron deficiency anemia, diverticulitis, kidney stone, thalassemia minor, with recent hospitalization from 10/22-11/8 due to septic shock which was possibly due to C. difficile colitis.Pt also hadworsening renal function and requiredCRRT in ICU. Herkidney function has improved. Her creatinine was 1.6 on 11/8. Pt wasdischarged on Lasix 40 mg daily to nursing home for rehab as stable condition.  Patient presents with confusion, fever, she did have episode of seizures as well, and decompensated respiratory failure where she was on BiPAP.  Note patient's baseline functional status and overall health condition is quite poor, husband and daughter have been told several times that her prognosis is poor and that they should consider SNF with palliative care/hospice.  However husband is adamant that patient be  discharged home with home health.  He thinks he will be able to manage her at home, he understands that if she declines again hospice is the best option.                                                                  Hospital Course   Sepsis - she presented with fever tachycardia, elevated lactic acid and acute metabolic/toxic encephalopathy.  CT abdomen and pelvis showed left ureter punctate stones with proximal hydronephrosis, hydroureter and gas in the bladder.  Although her cultures were negative but there was clinical signs and symptoms of infection, sepsis pathophysiology has resolved, all cultures are negative, all antibiotics were stopped on 12/02/2017.  Acute hypercapnic respiratory failure  - caused by acute on chronic diastolic CHF EF of 64% on echocardiogram, initially requiring BiPAP, he has been adequately diuresed, now placed on low-dose oral Lasix 40 BID.  Seizures  - by neurology, appears to be seizure-free, switched to oral Keppra and monitor.  Acute metabolic/Toxic encephalopathy:  - Due to combination of sepsis, hypercapnia and possibly seizures, CT head negative, MRI nonacute, ammonia stable, ABG suggested hypercapnia as well along  with sepsis.  Has been seen by neurology, with supportive care her encephalopathy has improved moderately, remains at risk for delirium, minimize narcotics and benzodiazepines, use Haldol for delirium.  Note she was recently admitted few weeks ago and had severe delirium at that time as well.  He is to have very poor baseline functional status and long-term prognosis remains guarded and Pall care is following for [place].  Explained to daughter and husband in detail.  Advance activity, PT OT eval. qualified for SNF but husband refused placement, will be discharged home with home health and equipment as needed.  Depression: -Home medications resumed.  HTN (hypertension):  -Home medications resumed   Iron deficiency anemia:  - Received IV iron  this admission, continue oral iron supplementation upon discharge.  Defer further work-up to PCP as age-appropriate.   CKD (chronic kidney disease), stage IV ([place]): recent cre 1.6 on 11/8-->1.43 today. BUN 27.   Urinary retention.  Failed Foley removal, replace Foley and will be discharged on it, Flomax, outpatient urology follow arranged by PCP.  C. difficile diarrhea:no diarrhea currently - Resolved, will try to minimize antibiotic exposure.  Stop Rocephin on 12/02/2017.  Hypokalemia and hypomagnesemia- both replaced will continue to monitor.  Sacral decubitus ulcer present on admission.  Wound care nurse.  Kindly review RN notes for details.    DM2 with renal manifestations (HCC):Last A1c6.7 on 05/24/17, well controled.    Continue home regimen.    Discharge diagnosis     Principal Problem:   Sepsis ([place]) Active Problems:   Depression   HTN (hypertension)   GERD   Chronic anemia   Iron deficiency anemia   CKD (chronic kidney disease), stage IV (H[facility])   Terminal care   Acute metabolic encephalopathy   C. difficile diarrhea   Type II diabetes mellitus with renal manifestations ([place])   Pressure injury of skin   Hypoxia    Discharge instructions    Discharge Instructions    Diet - low sodium heart healthy   Complete by:  As directed    Discharge instructions   Complete by:  As directed    Follow with Primary MD [name], MD in 7 days   Get CBC, CMP, 2 view Chest X ray -  checked  by Primary MD   in 5-7 days    Activity: As tolerated with Full fall precautions use walker/cane & assistance as needed  Disposition Home   Diet: Soft heart healthy diet with feeding assistance and aspiration precautions. Check CBGs QAC-HS  Special Instructions: If you have smoked or chewed Tobacco  in the last 2 yrs please stop smoking, stop any regular Alcohol  and or any Recreational drug use.  On your next visit with your primary care physician please Get Medicines  reviewed and adjusted.  Please request your Prim.MD to go over all Hospital Tests and Procedure/Radiological results at the follow up, please get all Hospital records sent to your Prim MD by signing hospital release before you go home.  If you experience worsening of your admission symptoms, develop shortness of breath, life threatening emergency, suicidal or homicidal thoughts you must seek medical attention immediately by calling 911 or calling your MD immediately  if symptoms less severe.  You Must read complete instructions/literature along with all the possible adverse reactions/side effects for all the Medicines you take and that have been prescribed to you. Take any new Medicines after you have completely understood and accpet all the possible adverse reactions/side effects.  Increase activity slowly   Complete by:  As directed       Discharge Medications   Allergies as of 12/06/2017      Reactions   Ativan [lorazepam]    Benadryl [diphenhydramine]    delirium   Penicillins Other (See Comments)   Unknown reaction Has patient had a PCN reaction causing immediate rash, facial/tongue/throat swelling, SOB or lightheadedness with hypotension: No Has patient had a PCN reaction causing severe rash involving mucus membranes or skin necrosis: No Has patient had a PCN reaction that required hospitalization: No Has patient had a PCN reaction occurring within the last 10 years: Unknown If all of the above answers are "NO", then may proceed with Cephalosporin use. Has received ceftriaxone w/o ADE      Medication List    TAKE these medications   feeding supplement (PRO-STAT SUGAR FREE 64) Liqd Take 30 mLs by mouth 2 (two) times daily between meals.   ferrous sulfate 325 (65 FE) MG tablet Take 1 tablet (325 mg total) by mouth 2 (two) times daily with a meal.   furosemide 40 MG tablet Commonly known as:  LASIX Take 1 tablet (40 mg total) by mouth 2 (two) times daily. What changed:      when to take this  Another medication with the same name was removed. Continue taking this medication, and follow the directions you see here.   guaiFENesin-dextromethorphan 100-10 MG/5ML syrup Commonly known as:  ROBITUSSIN DM Take 5 mLs by mouth every 4 (four) hours as needed for cough.   insulin aspart 100 UNIT/ML injection Commonly known as:  novoLOG Inject 0-9 Units into the skin every 4 (four) hours. What changed:    how much to take  when to take this  additional instructions   insulin glargine 100 UNIT/ML injection Commonly known as:  LANTUS Inject 0.1 mLs (10 Units total) into the skin at bedtime.   ipratropium-albuterol 0.5-2.5 (3) MG/3ML Soln Commonly known as:  DUONEB Take 3 mLs by nebulization every 4 (four) hours as needed. What changed:  reasons to take this   levETIRAcetam 1000 MG tablet Commonly known as:  KEPPRA Take 1 tablet (1,000 mg total) by mouth 2 (two) times daily.   metoprolol tartrate 25 MG tablet Commonly known as:  LOPRESSOR Take 0.5 tablets (12.5 mg total) by mouth 2 (two) times daily.   multivitamin with minerals Tabs tablet Take 1 tablet by mouth daily.   OXYGEN Inhale 2 L into the lungs continuous.   PRESERVISION AREDS 2 Caps Take 1 capsule by mouth daily.   QUEtiapine 50 MG tablet Commonly known as:  SEROQUEL Take 1 tablet (50 mg total) by mouth at bedtime. What changed:    medication strength  how much to take  when to take this   ranitidine 150 MG/10ML syrup Commonly known as:  ZANTAC Take 5 mLs (75 mg total) by mouth 2 (two) times daily.   simvastatin 80 MG tablet Commonly known as:  ZOCOR Take 80 mg by mouth at bedtime.   sucralfate 1 g tablet Commonly known as:  CARAFATE Take 1 g by mouth 4 (four) times daily.   tamsulosin 0.4 MG Caps capsule Commonly known as:  FLOMAX Take 1 capsule (0.4 mg total) by mouth daily. Start taking on:  12/07/2017            Durable Medical Equipment  (From admission,  onward)         Start     Ordered   12/05/17 1227  For home use only DME Other see comment  Once    Comments:  Bedside tray table   12/05/17 1227   12/05/17 1226  For home use only DME 3 n 1  Once     12/05/17 1227   12/05/17 1040  For home use only DME Other see comment  Once    Comments:  Hoyer lift   12/05/17 1040   12/05/17 0740  For home use only DME Hospital bed  Once    Question Answer Comment  Patient has (list medical condition): Metabolic encephalopathy, hypertension, hyperlipidemia, diabetes mellitus, GERD, gout, iron deficiency anemia, diverticulitis, kidney stone, thalassemia minor,   The above medical condition requires: Patient requires the ability to reposition frequently   Head must be elevated greater than: 30 degrees   Bed type Semi-electric   Hoyer Lift Yes      12/05/17 0740          Follow-up Information    [name], MD. Schedule an appointment as soon as possible for a visit in 1 week(s).   Specialty:  Endocrinology Contact information: [place] [address] [phone]        Lelon Perla, MD. Schedule an appointment as soon as possible for a visit in 2 week(s).   Specialty:  Cardiology Contact information: [address] [place] [address] [phone]           Major procedures and Radiology Reports - PLEASE review detailed and final reports thoroughly  -     TTE -  Left ventricle: The cavity size was normal. Wall thickness wasincreased in a pattern of mild LVH. Systolic function wasvigorous. The estimated ejection fraction was in the range of 65%to 70%. Wall motion was normal; there were no regional wallmotion abnormalities. Doppler parameters are consistent withabnormal left ventricular relaxation (grade 1 diastolicdysfunction). Doppler parameters are consistent with highventricular filling pressure. - Ventricular septum: Septal motion showed abnormal function anddyssynergy. - Aortic  valve: Peak velocity (S): 205 cm/s. - Mitral valve: Moderately calcified annulus. - Left atrium: The atrium was moderately dilated.  Ct Abdomen Pelvis Wo Contrast  Result Date: 11/27/2017 CLINICAL DATA:  Abdominal distention, altered mental status, sepsis. History of Clostridium difficile colitis, open wound of the abdominal wall, nephrolithiasis, hernia, gastroesophageal reflux disease, diverticulitis, colon polyp, cellulitis and abscess, adrenal adenoma, ventral hernia repair, hysterectomy, sigmoid resection, cholecystectomy, appendectomy. EXAM: CT ABDOMEN AND PELVIS WITHOUT CONTRAST TECHNIQUE: Multidetector CT imaging of the abdomen and pelvis was performed following the standard protocol without IV contrast. COMPARISON:  10/30/2017 FINDINGS: Lower chest: Evaluation is limited due to motion artifact. Atelectasis or consolidation in the lung bases, greater on the left. Coronary artery calcifications. Calcification in the mitral valve annulus. Hepatobiliary: No focal liver abnormality is seen. Status post cholecystectomy. No biliary dilatation. Pancreas: Fatty infiltration of the pancreas. Spleen: Normal in size without focal abnormality. Adrenals/Urinary Tract: 3.1 cm diameter left adrenal gland nodule. No change since previous study. Fat attenuation consistent with benign adenoma. Bilateral renal parenchymal atrophy. Mild left hydronephrosis and hydroureter with tiny punctate stones suggested in the distal left ureter. Bladder wall is not thickened. Gas in the bladder may come from instrumentation or infection. Exophytic lesion arising from the right kidney without change since prior study. Hyperdensity likely indicating a hemorrhagic cyst. Stomach/Bowel: Stomach, small bowel, and colon are not abnormally distended. Scattered stool throughout the colon. Scattered colonic diverticula. No evidence of diverticulitis. Prominent stool in the rectum with mild rectal wall thickening may indicate  stercoral  colitis. Infiltration in the presacral fat is unchanged since prior study, possibly inflammatory or reactive. Vascular/Lymphatic: Aortic atherosclerosis. No enlarged abdominal or pelvic lymph nodes. Reproductive: Status post hysterectomy. No adnexal masses. Other: Small amount of free fluid around the liver and spleen. Likely ascites. Scarring in the anterior abdominal wall consistent with postoperative change. Edema in the subcutaneous fat. No free air in the abdomen. Musculoskeletal: Degenerative changes in the spine. No destructive bone lesions. IMPRESSION: 1. Atelectasis or consolidation in the lung bases, greater on the left. 2. Left adrenal gland adenoma. 3. Bilateral renal parenchymal atrophy. Punctate stones suggested in the distal left ureter. Mild proximal hydronephrosis and hydroureter. Gas in the bladder may be due to instrumentation or infection. 4. Prominent stool in the rectum with rectal wall thickening suggesting possible stercoral colitis. 5. Small amount of free fluid in the abdomen and pelvis, likely ascites. 6. Probable hemorrhagic cysts in the right kidney. Aortic Atherosclerosis (ICD10-I70.0). Electronically Signed   By: Lucienne Capers M.D.   On: 11/27/2017 03:10   Dg Abd 1 View  Result Date: 11/09/2017 CLINICAL DATA:  Patient pulled out nasogastric catheter EXAM: ABDOMEN - 1 VIEW COMPARISON:  11/07/2017 FINDINGS: Scattered large and small bowel gas is noted. The previously seen nasogastric catheter has been removed in the interval. Despite the given clinical history of metal tipped catheter this catheter on previous images represented a standard nasogastric catheter without metallic weighting. No foreign body is noted. IMPRESSION: No evidence of retained foreign body as the prior nasogastric catheter was not a weighted tipped catheter based on previous images. Electronically Signed   By: Inez Catalina M.D.   On: 11/09/2017 19:57   Dg Abd 1 View  Result Date: 11/07/2017 CLINICAL  DATA:  84 year old female with feeding tube placement. EXAM: ABDOMEN - 1 VIEW COMPARISON:  Earlier abdominal radiograph dated 11/07/2017 FINDINGS: No significant change in the appearance or positioning of the enteric tube which appears somewhat folded, likely in the mid to distal stomach. IMPRESSION: No significant interval change. Electronically Signed   By: Anner Crete M.D.   On: 11/07/2017 23:00   Ct Head Wo Contrast  Result Date: 11/27/2017 CLINICAL DATA:  Altered mental status. History of hypertension, hepatitis, diabetes, edema. EXAM: CT HEAD WITHOUT CONTRAST TECHNIQUE: Contiguous axial images were obtained from the base of the skull through the vertex without intravenous contrast. COMPARISON:  MRI brain 11/29/2016. CT head 11/24/2016. FINDINGS: Brain: Diffuse cerebral atrophy. Ventricular dilatation consistent with central atrophy. Low-attenuation changes throughout the deep white matter consistent with small vessel ischemia. No mass-effect or midline shift. No abnormal extra-axial fluid collections. Gray-white matter junctions are distinct. Basal cisterns are not effaced. No acute intracranial hemorrhage. Vascular: Moderate intracranial arterial vascular calcifications are present. Skull: Calvarium appears intact. Sinuses/Orbits: Paranasal sinuses and mastoid air cells are clear. Other: None. IMPRESSION: No acute intracranial abnormalities. Chronic atrophy and small vessel ischemic changes. Electronically Signed   By: Lucienne Capers M.D.   On: 11/27/2017 03:02   Mr Brain Wo Contrast  Result Date: 11/30/2017 CLINICAL DATA:  Initial evaluation for acute altered mental status, seizure, unclear cause. EXAM: MRI HEAD WITHOUT CONTRAST TECHNIQUE: Multiplanar, multiecho pulse sequences of the brain and surrounding structures were obtained without intravenous contrast. COMPARISON:  Prior CT from 11/27/2017. FINDINGS: Brain: Diffuse prominence of the CSF containing spaces compatible with generalized  age-related cerebral atrophy, relatively stable in appearance as compared to most recent MRI from 11/29/2016. There has been interval development of extensive T2/FLAIR signal abnormality throughout the  supratentorial and infratentorial cerebral white matter, involving both cerebral hemispheres in a fairly symmetric pattern. Slight posterior predominance involving the parieto-occipital regions noted. Involvement of both internal and external capsules. Patchy infratentorial involvement involving the bilateral cerebellar hemispheres. Suggestion of associated mild edema as a few cortical gyri appear slightly expanded as compared to previous exam (series 10, image 20). Sparing of the overlying cortical gray matter which is relatively normal in appearance. Findings are nonspecific. Superimposed chronic microvascular ischemic changes noted within the pons, otherwise similar to previous. No abnormal foci of restricted diffusion to suggest acute or subacute ischemia. No encephalomalacia to suggest chronic cortical infarction. No foci of susceptibility artifact to suggest acute or chronic intracranial hemorrhage. No mass lesion, midline shift or mass effect. Stable ventricular prominence related global parenchymal volume loss without hydrocephalus. No extra-axial fluid collection. Pituitary gland normal. Midline structures intact. Vascular: Major intracranial vascular flow voids are maintained. Skull and upper cervical spine: Craniocervical junction within normal limits. No focal marrow replacing lesion. Scalp soft tissues unremarkable. Sinuses/Orbits: Patient status post ocular lens replacement bilaterally. Paranasal sinuses are clear. Small right mastoid effusion noted. Other: None. IMPRESSION: Extensive abnormal T2/FLAIR signal abnormality throughout the cerebral white matter as above, new relative to most recent MRI from 11/29/2016. Findings are nonspecific, with primary differential considerations including PRES versus  toxic/metabolic derangement. Anoxic brain injury felt to be unlikely given the relative sparing of the gray matter structures. Correlation with laboratory values and history recommended. Electronically Signed   By: Jeannine Boga M.D.   On: 11/30/2017 14:18   Dg Chest Port 1 View  Result Date: 12/02/2017 CLINICAL DATA:  Shortness of breath EXAM: PORTABLE CHEST 1 VIEW COMPARISON:  11/30/2017 and prior exams FINDINGS: Cardiomegaly and pulmonary vascular congestion again noted. LEFT LOWER lung consolidation/atelectasis and mild RIGHT basilar atelectasis again noted. No pneumothorax. There has been little interval change since prior studies IMPRESSION: Unchanged appearance of the chest with continued LEFT LOWER lung consolidation/atelectasis and mild RIGHT basilar atelectasis. Electronically Signed   By: Margarette Canada M.D.   On: 12/02/2017 08:12   Dg Chest Port 1 View  Result Date: 11/30/2017 CLINICAL DATA:  Shortness of breath, hypertension EXAM: PORTABLE CHEST 1 VIEW COMPARISON:  11/28/2017 FINDINGS: Cardiomegaly with vascular congestion and probable mild pulmonary edema. Findings are similar prior study. Suspect small layering effusions. No acute bony abnormality. IMPRESSION: Cardiomegaly with mild pulmonary edema and small layering effusions. No real change since prior study. Electronically Signed   By: Rolm Baptise M.D.   On: 11/30/2017 08:54   Dg Chest Port 1 View  Result Date: 11/28/2017 CLINICAL DATA:  Hypoxia. EXAM: PORTABLE CHEST 1 VIEW COMPARISON:  Radiograph November 26, 2017. FINDINGS: Stable cardiomegaly is noted with central pulmonary vascular congestion. No pneumothorax or pleural effusion is noted. Possible mild bilateral pulmonary edema may be present. Bony thorax is unremarkable. IMPRESSION: Stable cardiomegaly with central pulmonary vascular congestion and possible mild bilateral pulmonary edema. Electronically Signed   By: Marijo Conception, M.D.   On: 11/28/2017 12:55   Dg Chest  Port 1 View  Result Date: 11/26/2017 CLINICAL DATA:  Altered mental status EXAM: PORTABLE CHEST 1 VIEW COMPARISON:  11/13/2017 FINDINGS: AP portable semi upright view of the chest. The patient is rotated to the right. Stable cardiomegaly with interstitial edema. No pulmonary confluence, effusion or pneumothorax. No acute osseous abnormality. IMPRESSION: Cardiomegaly with interstitial edema.  No significant change. Electronically Signed   By: Ashley Royalty M.D.   On: 11/26/2017 22:48   Dg  Chest Port 1 View  Result Date: 11/13/2017 CLINICAL DATA:  Shortness of breath EXAM: PORTABLE CHEST 1 VIEW COMPARISON:  11/09/2017 FINDINGS: Cardiomegaly and slightly increased pulmonary vascular congestion noted. Mild bibasilar opacities/atelectasis noted in this low volume film. A RIGHT central venous catheter with tips overlying the LOWER SVC/SUPERIOR cavoatrial junction noted. No pneumothorax. IMPRESSION: Cardiomegaly with pulmonary vascular congestion and mild bibasilar opacities/atelectasis. Electronically Signed   By: Margarette Canada M.D.   On: 11/13/2017 07:49   Dg Chest Port 1 View  Result Date: 11/09/2017 CLINICAL DATA:  Patient removed recent nasogastric catheter EXAM: PORTABLE CHEST 1 VIEW COMPARISON:  11/08/2017 FINDINGS: Cardiac shadow is enlarged. Right jugular central line is again seen and stable. Inspiratory effort is poor with crowding of the vascular markings. No focal confluent infiltrate is seen. No radiopaque foreign body is noted. The previously seen nasogastric catheter has been removed and as previously described was not a weighted tipped catheter. IMPRESSION: Poor inspiratory effort.  No acute abnormality noted. Electronically Signed   By: Inez Catalina M.D.   On: 11/09/2017 19:58   Dg Chest Port 1 View  Result Date: 11/08/2017 CLINICAL DATA:  Respiratory failure. EXAM: PORTABLE CHEST 1 VIEW COMPARISON:  11/07/2017 and older exams. FINDINGS: Mild enlargement of the cardiopericardial silhouette,  stable. Prominent bronchovascular markings, with additional lung base opacity, the latter consistent with atelectasis, also without change from the previous day's exam. New nasal/orogastric tube passes below the included field of view, likely into the stomach. Right subclavian dual lumen central venous catheter is stable, tip in the right atrium. No pneumothorax. IMPRESSION: 1. No significant change in lung aeration from the previous day's study. There are prominent bronchovascular markings and lung base atelectasis, but no convincing pulmonary edema or pneumonia. 2. New nasal/orogastric tube. Tip not visualized, but likely extends into the stomach. Electronically Signed   By: Lajean Manes M.D.   On: 11/08/2017 07:13   Dg Chest Port 1 View  Result Date: 11/07/2017 CLINICAL DATA:  Acute respiratory failure with hypoxia. EXAM: PORTABLE CHEST 1 VIEW COMPARISON:  10/31/2017. FINDINGS: Cardiomegaly. Improved lung volumes. Increased perihilar markings could represent early edema or viral pneumonitis. Other than improved lung markings, similar appearance to priors. Dialysis catheter has been inserted via RIGHT IJ approach. Tip slide in the RIGHT atrium. There is no pneumothorax. IMPRESSION: Satisfactory catheter placement. No significant change in the appearance of the lungs except for improved volume. Cardiomegaly. Electronically Signed   By: Staci Righter M.D.   On: 11/07/2017 11:30   Dg Abd Portable 1v  Result Date: 12/06/2017 CLINICAL DATA:  Abdominal pain EXAM: PORTABLE ABDOMEN - 1 VIEW COMPARISON:  11/27/2017 FINDINGS: Scattered large and small bowel gas is noted. No obstructive changes are seen. Retained fecal material is noted within the rectum but to a lesser degree than that seen on prior CT examination. No free air is seen. No acute bony abnormality is noted. Degenerative changes of lumbar spine are again seen. IMPRESSION: Mild retained fecal material within the rectal vault although improved when  compared with the prior exam. Electronically Signed   By: Inez Catalina M.D.   On: 12/06/2017 07:59   Dg Abd Portable 1v  Result Date: 11/07/2017 CLINICAL DATA:  Nasogastric tube placement. EXAM: PORTABLE ABDOMEN - 1 VIEW COMPARISON:  11/03/2017 FINDINGS: Interval nasogastric tube folded back upon itself in the mid to distal stomach with its tip in the mid stomach. The included bowel gas pattern is normal. Prominent interstitial markings at the left lung base. Lower  thoracic spine degenerative changes. IMPRESSION: Nasogastric tube folded back upon itself in the mid to distal stomach with its tip in the mid stomach. Electronically Signed   By: Claudie Revering M.D.   On: 11/07/2017 13:13    Micro Results     Recent Results (from the past 240 hour(s))  Blood Culture (routine x 2)     Status: None   Collection Time: 11/26/17  9:52 PM  Result Value Ref Range Status   Specimen Description BLOOD RIGHT HAND  Final   Special Requests   Final    BOTTLES DRAWN AEROBIC AND ANAEROBIC Blood Culture adequate volume   Culture   Final    NO GROWTH 5 DAYS Performed at [facility], [address], [place], [place] [address]    Report Status 12/01/2017 FINAL  Final  Urine culture     Status: None   Collection Time: 11/26/17 10:37 PM  Result Value Ref Range Status   Specimen Description URINE, CATHETERIZED  Final   Special Requests NONE  Final   Culture   Final    NO GROWTH Performed at [facility], [place] [address] [place], [place] [address]    Report Status 11/28/2017 FINAL  Final  Blood Culture (routine x 2)     Status: None   Collection Time: 11/26/17 11:39 PM  Result Value Ref Range Status   Specimen Description BLOOD LEFT FOREARM  Final   Special Requests   Final    BOTTLES DRAWN AEROBIC AND ANAEROBIC Blood Culture adequate volume   Culture   Final    NO GROWTH 5 DAYS Performed at [facility], [place] [address], [place], [place] [address]    Report Status 12/02/2017 FINAL   Final  Respiratory Panel by PCR     Status: None   Collection Time: 11/27/17  1:38 PM  Result Value Ref Range Status   Adenovirus NOT DETECTED NOT DETECTED Final   Coronavirus 229E NOT DETECTED NOT DETECTED Final   Coronavirus HKU1 NOT DETECTED NOT DETECTED Final   Coronavirus NL63 NOT DETECTED NOT DETECTED Final   Coronavirus OC43 NOT DETECTED NOT DETECTED Final   Metapneumovirus NOT DETECTED NOT DETECTED Final   Rhinovirus / Enterovirus NOT DETECTED NOT DETECTED Final   Influenza A NOT DETECTED NOT DETECTED Final   Influenza B NOT DETECTED NOT DETECTED Final   Parainfluenza Virus 1 NOT DETECTED NOT DETECTED Final   Parainfluenza Virus 2 NOT DETECTED NOT DETECTED Final   Parainfluenza Virus 3 NOT DETECTED NOT DETECTED Final   Parainfluenza Virus 4 NOT DETECTED NOT DETECTED Final   Respiratory Syncytial Virus NOT DETECTED NOT DETECTED Final   Bordetella pertussis NOT DETECTED NOT DETECTED Final   Chlamydophila pneumoniae NOT DETECTED NOT DETECTED Final   Mycoplasma pneumoniae NOT DETECTED NOT DETECTED Final    Comment: Performed at [facility], [place] [address] [place], [place] [address]    Today   Subjective    Lauren Crosby today has no headache,no chest abdominal pain,no new weakness tingling or numbness, feels much better wants to go home today.     Objective   Blood pressure 134/87, pulse 81, temperature 98.6 F (37 C), temperature source Oral, resp. rate (!) 32, height 5\' 2"  (1.575 m), weight 102.6 kg, SpO2 94 %.   Intake/Output Summary (Last 24 hours) at 12/06/2017 0938 Last data filed at 12/06/2017 0510 Gross per 24 hour  Intake -  Output 2050 ml  Net -2050 ml  Exam  Awake remains pleasantly confused, no focal deficits, Foley in place, constantly moans [place].AT,PERRAL Supple Neck,No JVD, No cervical lymphadenopathy appriciated.  Symmetrical Chest wall movement, Good air movement bilaterally, CTAB RRR,No Gallops, Rubs or new Murmurs, No Parasternal  Heave +ve B.Sounds, Abd Soft, No tenderness, No organomegaly appriciated, No rebound - guarding or rigidity. Foley in place No Cyanosis, Clubbing or edema, No new Rash or bruise    Data Review   CBC w Diff:  Lab Results  Component Value Date   WBC 4.0 12/05/2017   HGB 8.2 (L) 12/05/2017   HGB 9.1 (L) 10/26/2009   HCT 27.7 (L) 12/05/2017   HCT 28.0 (L) 10/26/2009   PLT 138 (L) 12/05/2017   PLT 222 10/26/2009   LYMPHOPCT 9 11/26/2017   LYMPHOPCT 30.1 10/26/2009   MONOPCT 2 11/26/2017   MONOPCT 8.2 10/26/2009   EOSPCT 0 11/26/2017   EOSPCT 1.2 10/26/2009   BASOPCT 0 11/26/2017   BASOPCT 0.4 10/26/2009    CMP:  Lab Results  Component Value Date   NA 139 12/05/2017   K 4.3 12/05/2017   CL 101 12/05/2017   CO2 31 12/05/2017   BUN 18 12/05/2017   CREATININE 1.20 (H) 12/05/2017   CREATININE 0.85 08/14/2012   PROT 8.1 11/26/2017   ALBUMIN 3.6 11/26/2017   BILITOT 1.0 11/26/2017   ALKPHOS 137 (H) 11/26/2017   AST 29 11/26/2017   ALT 21 11/26/2017  .   Total Time in preparing paper work, data evaluation and todays exam - 70 minutes  Lala Lund M.D on 12/06/2017 at 9:38 AM  [facility]   Office  [phone]

## 2017-12-06 NOTE — Discharge Instructions (Signed)
Follow with Primary MD [name], MD in 7 days   Get CBC, CMP, 2 view Chest X ray -  checked  by Primary MD   in 5-7 days    Activity: As tolerated with Full fall precautions use walker/cane & assistance as needed  Disposition Home   Diet: Soft heart healthy diet with feeding assistance and aspiration precautions. Check CBGs QAC-HS  Special Instructions: If you have smoked or chewed Tobacco  in the last 2 yrs please stop smoking, stop any regular Alcohol  and or any Recreational drug use.  On your next visit with your primary care physician please Get Medicines reviewed and adjusted.  Please request your Prim.MD to go over all Hospital Tests and Procedure/Radiological results at the follow up, please get all Hospital records sent to your Prim MD by signing hospital release before you go home.  If you experience worsening of your admission symptoms, develop shortness of breath, life threatening emergency, suicidal or homicidal thoughts you must seek medical attention immediately by calling 911 or calling your MD immediately  if symptoms less severe.  You Must read complete instructions/literature along with all the possible adverse reactions/side effects for all the Medicines you take and that have been prescribed to you. Take any new Medicines after you have completely understood and accpet all the possible adverse reactions/side effects.

## 2017-12-10 DIAGNOSIS — D500 Iron deficiency anemia secondary to blood loss (chronic): Secondary | ICD-10-CM | POA: Diagnosis not present

## 2017-12-10 DIAGNOSIS — N184 Chronic kidney disease, stage 4 (severe): Secondary | ICD-10-CM | POA: Diagnosis not present

## 2017-12-10 DIAGNOSIS — R569 Unspecified convulsions: Secondary | ICD-10-CM | POA: Diagnosis not present

## 2017-12-10 DIAGNOSIS — M109 Gout, unspecified: Secondary | ICD-10-CM | POA: Diagnosis not present

## 2017-12-10 DIAGNOSIS — E1122 Type 2 diabetes mellitus with diabetic chronic kidney disease: Secondary | ICD-10-CM | POA: Diagnosis not present

## 2017-12-10 DIAGNOSIS — I5032 Chronic diastolic (congestive) heart failure: Secondary | ICD-10-CM | POA: Diagnosis not present

## 2017-12-10 DIAGNOSIS — M1712 Unilateral primary osteoarthritis, left knee: Secondary | ICD-10-CM | POA: Diagnosis not present

## 2017-12-10 DIAGNOSIS — L89322 Pressure ulcer of left buttock, stage 2: Secondary | ICD-10-CM | POA: Diagnosis not present

## 2017-12-10 DIAGNOSIS — I130 Hypertensive heart and chronic kidney disease with heart failure and stage 1 through stage 4 chronic kidney disease, or unspecified chronic kidney disease: Secondary | ICD-10-CM | POA: Diagnosis not present

## 2017-12-10 LAB — GLUCOSE, CAPILLARY: Glucose-Capillary: 157 mg/dL — ABNORMAL HIGH (ref 70–99)

## 2017-12-11 DIAGNOSIS — I5032 Chronic diastolic (congestive) heart failure: Secondary | ICD-10-CM | POA: Diagnosis not present

## 2017-12-11 DIAGNOSIS — I130 Hypertensive heart and chronic kidney disease with heart failure and stage 1 through stage 4 chronic kidney disease, or unspecified chronic kidney disease: Secondary | ICD-10-CM | POA: Diagnosis not present

## 2017-12-11 DIAGNOSIS — L89322 Pressure ulcer of left buttock, stage 2: Secondary | ICD-10-CM | POA: Diagnosis not present

## 2017-12-11 DIAGNOSIS — E1122 Type 2 diabetes mellitus with diabetic chronic kidney disease: Secondary | ICD-10-CM | POA: Diagnosis not present

## 2017-12-11 DIAGNOSIS — N184 Chronic kidney disease, stage 4 (severe): Secondary | ICD-10-CM | POA: Diagnosis not present

## 2017-12-11 DIAGNOSIS — M1712 Unilateral primary osteoarthritis, left knee: Secondary | ICD-10-CM | POA: Diagnosis not present

## 2017-12-11 DIAGNOSIS — D500 Iron deficiency anemia secondary to blood loss (chronic): Secondary | ICD-10-CM | POA: Diagnosis not present

## 2017-12-11 DIAGNOSIS — M109 Gout, unspecified: Secondary | ICD-10-CM | POA: Diagnosis not present

## 2017-12-11 DIAGNOSIS — R569 Unspecified convulsions: Secondary | ICD-10-CM | POA: Diagnosis not present

## 2017-12-12 DIAGNOSIS — L89322 Pressure ulcer of left buttock, stage 2: Secondary | ICD-10-CM | POA: Diagnosis not present

## 2017-12-12 DIAGNOSIS — M1712 Unilateral primary osteoarthritis, left knee: Secondary | ICD-10-CM | POA: Diagnosis not present

## 2017-12-12 DIAGNOSIS — M109 Gout, unspecified: Secondary | ICD-10-CM | POA: Diagnosis not present

## 2017-12-12 DIAGNOSIS — R569 Unspecified convulsions: Secondary | ICD-10-CM | POA: Diagnosis not present

## 2017-12-12 DIAGNOSIS — D500 Iron deficiency anemia secondary to blood loss (chronic): Secondary | ICD-10-CM | POA: Diagnosis not present

## 2017-12-12 DIAGNOSIS — I5032 Chronic diastolic (congestive) heart failure: Secondary | ICD-10-CM | POA: Diagnosis not present

## 2017-12-12 DIAGNOSIS — I130 Hypertensive heart and chronic kidney disease with heart failure and stage 1 through stage 4 chronic kidney disease, or unspecified chronic kidney disease: Secondary | ICD-10-CM | POA: Diagnosis not present

## 2017-12-12 DIAGNOSIS — N184 Chronic kidney disease, stage 4 (severe): Secondary | ICD-10-CM | POA: Diagnosis not present

## 2017-12-12 DIAGNOSIS — E1122 Type 2 diabetes mellitus with diabetic chronic kidney disease: Secondary | ICD-10-CM | POA: Diagnosis not present

## 2017-12-14 DIAGNOSIS — I5032 Chronic diastolic (congestive) heart failure: Secondary | ICD-10-CM | POA: Diagnosis not present

## 2017-12-14 DIAGNOSIS — E1122 Type 2 diabetes mellitus with diabetic chronic kidney disease: Secondary | ICD-10-CM | POA: Diagnosis not present

## 2017-12-14 DIAGNOSIS — D500 Iron deficiency anemia secondary to blood loss (chronic): Secondary | ICD-10-CM | POA: Diagnosis not present

## 2017-12-14 DIAGNOSIS — L89322 Pressure ulcer of left buttock, stage 2: Secondary | ICD-10-CM | POA: Diagnosis not present

## 2017-12-14 DIAGNOSIS — R569 Unspecified convulsions: Secondary | ICD-10-CM | POA: Diagnosis not present

## 2017-12-14 DIAGNOSIS — M1712 Unilateral primary osteoarthritis, left knee: Secondary | ICD-10-CM | POA: Diagnosis not present

## 2017-12-14 DIAGNOSIS — M109 Gout, unspecified: Secondary | ICD-10-CM | POA: Diagnosis not present

## 2017-12-14 DIAGNOSIS — N184 Chronic kidney disease, stage 4 (severe): Secondary | ICD-10-CM | POA: Diagnosis not present

## 2017-12-14 DIAGNOSIS — I130 Hypertensive heart and chronic kidney disease with heart failure and stage 1 through stage 4 chronic kidney disease, or unspecified chronic kidney disease: Secondary | ICD-10-CM | POA: Diagnosis not present

## 2017-12-17 DIAGNOSIS — L89322 Pressure ulcer of left buttock, stage 2: Secondary | ICD-10-CM | POA: Diagnosis not present

## 2017-12-17 DIAGNOSIS — E1122 Type 2 diabetes mellitus with diabetic chronic kidney disease: Secondary | ICD-10-CM | POA: Diagnosis not present

## 2017-12-17 DIAGNOSIS — M109 Gout, unspecified: Secondary | ICD-10-CM | POA: Diagnosis not present

## 2017-12-17 DIAGNOSIS — M1712 Unilateral primary osteoarthritis, left knee: Secondary | ICD-10-CM | POA: Diagnosis not present

## 2017-12-17 DIAGNOSIS — I130 Hypertensive heart and chronic kidney disease with heart failure and stage 1 through stage 4 chronic kidney disease, or unspecified chronic kidney disease: Secondary | ICD-10-CM | POA: Diagnosis not present

## 2017-12-17 DIAGNOSIS — R569 Unspecified convulsions: Secondary | ICD-10-CM | POA: Diagnosis not present

## 2017-12-17 DIAGNOSIS — D500 Iron deficiency anemia secondary to blood loss (chronic): Secondary | ICD-10-CM | POA: Diagnosis not present

## 2017-12-17 DIAGNOSIS — I5032 Chronic diastolic (congestive) heart failure: Secondary | ICD-10-CM | POA: Diagnosis not present

## 2017-12-17 DIAGNOSIS — N184 Chronic kidney disease, stage 4 (severe): Secondary | ICD-10-CM | POA: Diagnosis not present

## 2017-12-18 DIAGNOSIS — L89322 Pressure ulcer of left buttock, stage 2: Secondary | ICD-10-CM | POA: Diagnosis not present

## 2017-12-18 DIAGNOSIS — N184 Chronic kidney disease, stage 4 (severe): Secondary | ICD-10-CM | POA: Diagnosis not present

## 2017-12-18 DIAGNOSIS — I5032 Chronic diastolic (congestive) heart failure: Secondary | ICD-10-CM | POA: Diagnosis not present

## 2017-12-18 DIAGNOSIS — M1712 Unilateral primary osteoarthritis, left knee: Secondary | ICD-10-CM | POA: Diagnosis not present

## 2017-12-18 DIAGNOSIS — D500 Iron deficiency anemia secondary to blood loss (chronic): Secondary | ICD-10-CM | POA: Diagnosis not present

## 2017-12-18 DIAGNOSIS — M109 Gout, unspecified: Secondary | ICD-10-CM | POA: Diagnosis not present

## 2017-12-18 DIAGNOSIS — R569 Unspecified convulsions: Secondary | ICD-10-CM | POA: Diagnosis not present

## 2017-12-18 DIAGNOSIS — I130 Hypertensive heart and chronic kidney disease with heart failure and stage 1 through stage 4 chronic kidney disease, or unspecified chronic kidney disease: Secondary | ICD-10-CM | POA: Diagnosis not present

## 2017-12-18 DIAGNOSIS — E1122 Type 2 diabetes mellitus with diabetic chronic kidney disease: Secondary | ICD-10-CM | POA: Diagnosis not present

## 2017-12-19 DIAGNOSIS — M109 Gout, unspecified: Secondary | ICD-10-CM | POA: Diagnosis not present

## 2017-12-19 DIAGNOSIS — E1122 Type 2 diabetes mellitus with diabetic chronic kidney disease: Secondary | ICD-10-CM | POA: Diagnosis not present

## 2017-12-19 DIAGNOSIS — N184 Chronic kidney disease, stage 4 (severe): Secondary | ICD-10-CM | POA: Diagnosis not present

## 2017-12-19 DIAGNOSIS — L89322 Pressure ulcer of left buttock, stage 2: Secondary | ICD-10-CM | POA: Diagnosis not present

## 2017-12-19 DIAGNOSIS — I130 Hypertensive heart and chronic kidney disease with heart failure and stage 1 through stage 4 chronic kidney disease, or unspecified chronic kidney disease: Secondary | ICD-10-CM | POA: Diagnosis not present

## 2017-12-19 DIAGNOSIS — R569 Unspecified convulsions: Secondary | ICD-10-CM | POA: Diagnosis not present

## 2017-12-19 DIAGNOSIS — M1712 Unilateral primary osteoarthritis, left knee: Secondary | ICD-10-CM | POA: Diagnosis not present

## 2017-12-19 DIAGNOSIS — I5032 Chronic diastolic (congestive) heart failure: Secondary | ICD-10-CM | POA: Diagnosis not present

## 2017-12-19 DIAGNOSIS — D500 Iron deficiency anemia secondary to blood loss (chronic): Secondary | ICD-10-CM | POA: Diagnosis not present

## 2017-12-20 DIAGNOSIS — N184 Chronic kidney disease, stage 4 (severe): Secondary | ICD-10-CM | POA: Diagnosis not present

## 2017-12-20 DIAGNOSIS — D500 Iron deficiency anemia secondary to blood loss (chronic): Secondary | ICD-10-CM | POA: Diagnosis not present

## 2017-12-20 DIAGNOSIS — I130 Hypertensive heart and chronic kidney disease with heart failure and stage 1 through stage 4 chronic kidney disease, or unspecified chronic kidney disease: Secondary | ICD-10-CM | POA: Diagnosis not present

## 2017-12-20 DIAGNOSIS — L89322 Pressure ulcer of left buttock, stage 2: Secondary | ICD-10-CM | POA: Diagnosis not present

## 2017-12-20 DIAGNOSIS — R569 Unspecified convulsions: Secondary | ICD-10-CM | POA: Diagnosis not present

## 2017-12-20 DIAGNOSIS — I5032 Chronic diastolic (congestive) heart failure: Secondary | ICD-10-CM | POA: Diagnosis not present

## 2017-12-20 DIAGNOSIS — E1122 Type 2 diabetes mellitus with diabetic chronic kidney disease: Secondary | ICD-10-CM | POA: Diagnosis not present

## 2017-12-20 DIAGNOSIS — M1712 Unilateral primary osteoarthritis, left knee: Secondary | ICD-10-CM | POA: Diagnosis not present

## 2017-12-20 DIAGNOSIS — M109 Gout, unspecified: Secondary | ICD-10-CM | POA: Diagnosis not present

## 2017-12-21 DIAGNOSIS — I130 Hypertensive heart and chronic kidney disease with heart failure and stage 1 through stage 4 chronic kidney disease, or unspecified chronic kidney disease: Secondary | ICD-10-CM | POA: Diagnosis not present

## 2017-12-21 DIAGNOSIS — R569 Unspecified convulsions: Secondary | ICD-10-CM | POA: Diagnosis not present

## 2017-12-21 DIAGNOSIS — D500 Iron deficiency anemia secondary to blood loss (chronic): Secondary | ICD-10-CM | POA: Diagnosis not present

## 2017-12-21 DIAGNOSIS — M109 Gout, unspecified: Secondary | ICD-10-CM | POA: Diagnosis not present

## 2017-12-21 DIAGNOSIS — M1712 Unilateral primary osteoarthritis, left knee: Secondary | ICD-10-CM | POA: Diagnosis not present

## 2017-12-21 DIAGNOSIS — L89322 Pressure ulcer of left buttock, stage 2: Secondary | ICD-10-CM | POA: Diagnosis not present

## 2017-12-21 DIAGNOSIS — N184 Chronic kidney disease, stage 4 (severe): Secondary | ICD-10-CM | POA: Diagnosis not present

## 2017-12-21 DIAGNOSIS — E1122 Type 2 diabetes mellitus with diabetic chronic kidney disease: Secondary | ICD-10-CM | POA: Diagnosis not present

## 2017-12-21 DIAGNOSIS — I5032 Chronic diastolic (congestive) heart failure: Secondary | ICD-10-CM | POA: Diagnosis not present

## 2017-12-22 DIAGNOSIS — E1165 Type 2 diabetes mellitus with hyperglycemia: Secondary | ICD-10-CM | POA: Diagnosis not present

## 2017-12-22 DIAGNOSIS — N179 Acute kidney failure, unspecified: Secondary | ICD-10-CM | POA: Diagnosis not present

## 2017-12-22 DIAGNOSIS — G629 Polyneuropathy, unspecified: Secondary | ICD-10-CM | POA: Diagnosis not present

## 2017-12-22 DIAGNOSIS — J9610 Chronic respiratory failure, unspecified whether with hypoxia or hypercapnia: Secondary | ICD-10-CM | POA: Diagnosis not present

## 2017-12-22 DIAGNOSIS — Z0001 Encounter for general adult medical examination with abnormal findings: Secondary | ICD-10-CM | POA: Diagnosis not present

## 2017-12-24 DIAGNOSIS — N184 Chronic kidney disease, stage 4 (severe): Secondary | ICD-10-CM | POA: Diagnosis not present

## 2017-12-24 DIAGNOSIS — L89322 Pressure ulcer of left buttock, stage 2: Secondary | ICD-10-CM | POA: Diagnosis not present

## 2017-12-24 DIAGNOSIS — M1712 Unilateral primary osteoarthritis, left knee: Secondary | ICD-10-CM | POA: Diagnosis not present

## 2017-12-24 DIAGNOSIS — E1122 Type 2 diabetes mellitus with diabetic chronic kidney disease: Secondary | ICD-10-CM | POA: Diagnosis not present

## 2017-12-24 DIAGNOSIS — M109 Gout, unspecified: Secondary | ICD-10-CM | POA: Diagnosis not present

## 2017-12-24 DIAGNOSIS — I5032 Chronic diastolic (congestive) heart failure: Secondary | ICD-10-CM | POA: Diagnosis not present

## 2017-12-24 DIAGNOSIS — I130 Hypertensive heart and chronic kidney disease with heart failure and stage 1 through stage 4 chronic kidney disease, or unspecified chronic kidney disease: Secondary | ICD-10-CM | POA: Diagnosis not present

## 2017-12-24 DIAGNOSIS — D500 Iron deficiency anemia secondary to blood loss (chronic): Secondary | ICD-10-CM | POA: Diagnosis not present

## 2017-12-24 DIAGNOSIS — R569 Unspecified convulsions: Secondary | ICD-10-CM | POA: Diagnosis not present

## 2017-12-25 DIAGNOSIS — E1122 Type 2 diabetes mellitus with diabetic chronic kidney disease: Secondary | ICD-10-CM | POA: Diagnosis not present

## 2017-12-25 DIAGNOSIS — D500 Iron deficiency anemia secondary to blood loss (chronic): Secondary | ICD-10-CM | POA: Diagnosis not present

## 2017-12-25 DIAGNOSIS — I5032 Chronic diastolic (congestive) heart failure: Secondary | ICD-10-CM | POA: Diagnosis not present

## 2017-12-25 DIAGNOSIS — M109 Gout, unspecified: Secondary | ICD-10-CM | POA: Diagnosis not present

## 2017-12-25 DIAGNOSIS — I130 Hypertensive heart and chronic kidney disease with heart failure and stage 1 through stage 4 chronic kidney disease, or unspecified chronic kidney disease: Secondary | ICD-10-CM | POA: Diagnosis not present

## 2017-12-25 DIAGNOSIS — L89322 Pressure ulcer of left buttock, stage 2: Secondary | ICD-10-CM | POA: Diagnosis not present

## 2017-12-25 DIAGNOSIS — N184 Chronic kidney disease, stage 4 (severe): Secondary | ICD-10-CM | POA: Diagnosis not present

## 2017-12-25 DIAGNOSIS — R569 Unspecified convulsions: Secondary | ICD-10-CM | POA: Diagnosis not present

## 2017-12-25 DIAGNOSIS — M1712 Unilateral primary osteoarthritis, left knee: Secondary | ICD-10-CM | POA: Diagnosis not present

## 2017-12-26 DIAGNOSIS — I5032 Chronic diastolic (congestive) heart failure: Secondary | ICD-10-CM | POA: Diagnosis not present

## 2017-12-26 DIAGNOSIS — R569 Unspecified convulsions: Secondary | ICD-10-CM | POA: Diagnosis not present

## 2017-12-26 DIAGNOSIS — L89322 Pressure ulcer of left buttock, stage 2: Secondary | ICD-10-CM | POA: Diagnosis not present

## 2017-12-26 DIAGNOSIS — D500 Iron deficiency anemia secondary to blood loss (chronic): Secondary | ICD-10-CM | POA: Diagnosis not present

## 2017-12-26 DIAGNOSIS — E1122 Type 2 diabetes mellitus with diabetic chronic kidney disease: Secondary | ICD-10-CM | POA: Diagnosis not present

## 2017-12-26 DIAGNOSIS — I130 Hypertensive heart and chronic kidney disease with heart failure and stage 1 through stage 4 chronic kidney disease, or unspecified chronic kidney disease: Secondary | ICD-10-CM | POA: Diagnosis not present

## 2017-12-26 DIAGNOSIS — J9610 Chronic respiratory failure, unspecified whether with hypoxia or hypercapnia: Secondary | ICD-10-CM | POA: Diagnosis not present

## 2017-12-26 DIAGNOSIS — N184 Chronic kidney disease, stage 4 (severe): Secondary | ICD-10-CM | POA: Diagnosis not present

## 2017-12-26 DIAGNOSIS — M109 Gout, unspecified: Secondary | ICD-10-CM | POA: Diagnosis not present

## 2017-12-26 DIAGNOSIS — M1712 Unilateral primary osteoarthritis, left knee: Secondary | ICD-10-CM | POA: Diagnosis not present

## 2017-12-27 DIAGNOSIS — M109 Gout, unspecified: Secondary | ICD-10-CM | POA: Diagnosis not present

## 2017-12-27 DIAGNOSIS — L89322 Pressure ulcer of left buttock, stage 2: Secondary | ICD-10-CM | POA: Diagnosis not present

## 2017-12-27 DIAGNOSIS — E1122 Type 2 diabetes mellitus with diabetic chronic kidney disease: Secondary | ICD-10-CM | POA: Diagnosis not present

## 2017-12-27 DIAGNOSIS — N390 Urinary tract infection, site not specified: Secondary | ICD-10-CM | POA: Diagnosis not present

## 2017-12-27 DIAGNOSIS — I130 Hypertensive heart and chronic kidney disease with heart failure and stage 1 through stage 4 chronic kidney disease, or unspecified chronic kidney disease: Secondary | ICD-10-CM | POA: Diagnosis not present

## 2017-12-27 DIAGNOSIS — M1712 Unilateral primary osteoarthritis, left knee: Secondary | ICD-10-CM | POA: Diagnosis not present

## 2017-12-27 DIAGNOSIS — N184 Chronic kidney disease, stage 4 (severe): Secondary | ICD-10-CM | POA: Diagnosis not present

## 2017-12-27 DIAGNOSIS — R569 Unspecified convulsions: Secondary | ICD-10-CM | POA: Diagnosis not present

## 2017-12-27 DIAGNOSIS — D500 Iron deficiency anemia secondary to blood loss (chronic): Secondary | ICD-10-CM | POA: Diagnosis not present

## 2017-12-27 DIAGNOSIS — I5032 Chronic diastolic (congestive) heart failure: Secondary | ICD-10-CM | POA: Diagnosis not present

## 2017-12-31 DIAGNOSIS — D500 Iron deficiency anemia secondary to blood loss (chronic): Secondary | ICD-10-CM | POA: Diagnosis not present

## 2017-12-31 DIAGNOSIS — M1712 Unilateral primary osteoarthritis, left knee: Secondary | ICD-10-CM | POA: Diagnosis not present

## 2017-12-31 DIAGNOSIS — N184 Chronic kidney disease, stage 4 (severe): Secondary | ICD-10-CM | POA: Diagnosis not present

## 2017-12-31 DIAGNOSIS — E1122 Type 2 diabetes mellitus with diabetic chronic kidney disease: Secondary | ICD-10-CM | POA: Diagnosis not present

## 2017-12-31 DIAGNOSIS — L89322 Pressure ulcer of left buttock, stage 2: Secondary | ICD-10-CM | POA: Diagnosis not present

## 2017-12-31 DIAGNOSIS — I130 Hypertensive heart and chronic kidney disease with heart failure and stage 1 through stage 4 chronic kidney disease, or unspecified chronic kidney disease: Secondary | ICD-10-CM | POA: Diagnosis not present

## 2017-12-31 DIAGNOSIS — R569 Unspecified convulsions: Secondary | ICD-10-CM | POA: Diagnosis not present

## 2017-12-31 DIAGNOSIS — I5032 Chronic diastolic (congestive) heart failure: Secondary | ICD-10-CM | POA: Diagnosis not present

## 2017-12-31 DIAGNOSIS — M109 Gout, unspecified: Secondary | ICD-10-CM | POA: Diagnosis not present

## 2018-01-01 DIAGNOSIS — I10 Essential (primary) hypertension: Secondary | ICD-10-CM | POA: Diagnosis not present

## 2018-01-01 DIAGNOSIS — N184 Chronic kidney disease, stage 4 (severe): Secondary | ICD-10-CM | POA: Diagnosis not present

## 2018-01-04 DIAGNOSIS — J9601 Acute respiratory failure with hypoxia: Secondary | ICD-10-CM | POA: Diagnosis not present

## 2018-01-04 DIAGNOSIS — J189 Pneumonia, unspecified organism: Secondary | ICD-10-CM | POA: Diagnosis not present

## 2018-01-04 DIAGNOSIS — G9341 Metabolic encephalopathy: Secondary | ICD-10-CM | POA: Diagnosis not present

## 2018-01-04 DIAGNOSIS — J9611 Chronic respiratory failure with hypoxia: Secondary | ICD-10-CM | POA: Diagnosis not present

## 2018-01-04 DIAGNOSIS — E1129 Type 2 diabetes mellitus with other diabetic kidney complication: Secondary | ICD-10-CM | POA: Diagnosis not present

## 2018-01-04 DIAGNOSIS — R2689 Other abnormalities of gait and mobility: Secondary | ICD-10-CM | POA: Diagnosis not present

## 2018-01-04 DIAGNOSIS — L8990 Pressure ulcer of unspecified site, unspecified stage: Secondary | ICD-10-CM | POA: Diagnosis not present

## 2018-01-08 ENCOUNTER — Emergency Department (HOSPITAL_COMMUNITY): Payer: Medicare HMO

## 2018-01-08 ENCOUNTER — Emergency Department (HOSPITAL_COMMUNITY)
Admission: EM | Admit: 2018-01-08 | Discharge: 2018-01-08 | Disposition: A | Discharge status: Home / Self Care | Payer: Medicare HMO | Attending: Emergency Medicine | Admitting: Emergency Medicine

## 2018-01-08 ENCOUNTER — Emergency Department (HOSPITAL_BASED_OUTPATIENT_CLINIC_OR_DEPARTMENT_OTHER)
Admit: 2018-01-08 | Discharge: 2018-01-08 | Disposition: A | Discharge status: Home / Self Care | Payer: Medicare HMO | Attending: Emergency Medicine | Admitting: Emergency Medicine

## 2018-01-08 DIAGNOSIS — M2550 Pain in unspecified joint: Secondary | ICD-10-CM | POA: Diagnosis not present

## 2018-01-08 DIAGNOSIS — Z7401 Bed confinement status: Secondary | ICD-10-CM | POA: Diagnosis not present

## 2018-01-08 DIAGNOSIS — M1712 Unilateral primary osteoarthritis, left knee: Secondary | ICD-10-CM | POA: Diagnosis not present

## 2018-01-08 DIAGNOSIS — L89322 Pressure ulcer of left buttock, stage 2: Secondary | ICD-10-CM | POA: Diagnosis not present

## 2018-01-08 DIAGNOSIS — M79609 Pain in unspecified limb: Secondary | ICD-10-CM | POA: Diagnosis not present

## 2018-01-08 DIAGNOSIS — R29898 Other symptoms and signs involving the musculoskeletal system: Secondary | ICD-10-CM | POA: Diagnosis not present

## 2018-01-08 DIAGNOSIS — I129 Hypertensive chronic kidney disease with stage 1 through stage 4 chronic kidney disease, or unspecified chronic kidney disease: Secondary | ICD-10-CM | POA: Diagnosis not present

## 2018-01-08 DIAGNOSIS — I130 Hypertensive heart and chronic kidney disease with heart failure and stage 1 through stage 4 chronic kidney disease, or unspecified chronic kidney disease: Secondary | ICD-10-CM | POA: Diagnosis not present

## 2018-01-08 DIAGNOSIS — E1122 Type 2 diabetes mellitus with diabetic chronic kidney disease: Secondary | ICD-10-CM | POA: Insufficient documentation

## 2018-01-08 DIAGNOSIS — I10 Essential (primary) hypertension: Secondary | ICD-10-CM | POA: Diagnosis not present

## 2018-01-08 DIAGNOSIS — M109 Gout, unspecified: Secondary | ICD-10-CM | POA: Diagnosis not present

## 2018-01-08 DIAGNOSIS — M79605 Pain in left leg: Secondary | ICD-10-CM | POA: Diagnosis not present

## 2018-01-08 DIAGNOSIS — I5032 Chronic diastolic (congestive) heart failure: Secondary | ICD-10-CM | POA: Diagnosis not present

## 2018-01-08 DIAGNOSIS — R410 Disorientation, unspecified: Secondary | ICD-10-CM | POA: Diagnosis not present

## 2018-01-08 DIAGNOSIS — R0902 Hypoxemia: Secondary | ICD-10-CM | POA: Diagnosis not present

## 2018-01-08 DIAGNOSIS — R52 Pain, unspecified: Secondary | ICD-10-CM | POA: Diagnosis not present

## 2018-01-08 DIAGNOSIS — N184 Chronic kidney disease, stage 4 (severe): Secondary | ICD-10-CM | POA: Insufficient documentation

## 2018-01-08 DIAGNOSIS — M545 Low back pain: Secondary | ICD-10-CM | POA: Diagnosis not present

## 2018-01-08 DIAGNOSIS — D500 Iron deficiency anemia secondary to blood loss (chronic): Secondary | ICD-10-CM | POA: Diagnosis not present

## 2018-01-08 DIAGNOSIS — Z794 Long term (current) use of insulin: Secondary | ICD-10-CM | POA: Insufficient documentation

## 2018-01-08 DIAGNOSIS — Z96651 Presence of right artificial knee joint: Secondary | ICD-10-CM | POA: Diagnosis not present

## 2018-01-08 DIAGNOSIS — Z79899 Other long term (current) drug therapy: Secondary | ICD-10-CM | POA: Diagnosis not present

## 2018-01-08 DIAGNOSIS — R569 Unspecified convulsions: Secondary | ICD-10-CM | POA: Diagnosis not present

## 2018-01-08 DIAGNOSIS — I959 Hypotension, unspecified: Secondary | ICD-10-CM | POA: Diagnosis not present

## 2018-01-08 LAB — BASIC METABOLIC PANEL
Anion gap: 12 (ref 5–15)
BUN: 54 mg/dL — ABNORMAL HIGH (ref 8–23)
CO2: 30 mmol/L (ref 22–32)
Calcium: 9.7 mg/dL (ref 8.9–10.3)
Chloride: 96 mmol/L — ABNORMAL LOW (ref 98–111)
Creatinine, Ser: 1.27 mg/dL — ABNORMAL HIGH (ref 0.44–1.00)
GFR calc Af Amer: 45 mL/min — ABNORMAL LOW (ref >60)
GFR calc non Af Amer: 39 mL/min — ABNORMAL LOW (ref >60)
Glucose, Bld: 162 mg/dL — ABNORMAL HIGH (ref 70–99)
Potassium: 4.7 mmol/L (ref 3.5–5.1)
Sodium: 138 mmol/L (ref 135–145)

## 2018-01-08 LAB — CBC WITH DIFFERENTIAL/PLATELET
Abs Immature Granulocytes: 0.04 10*3/uL (ref 0.00–0.07)
BASOS PCT: 0 %
Basophils Absolute: 0 10*3/uL (ref 0.0–0.1)
Eosinophils Absolute: 0.1 10*3/uL (ref 0.0–0.5)
Eosinophils Relative: 2 %
HCT: 33.9 % — ABNORMAL LOW (ref 36.0–46.0)
Hemoglobin: 10.3 g/dL — ABNORMAL LOW (ref 12.0–15.0)
Immature Granulocytes: 1 %
Lymphocytes Relative: 31 %
Lymphs Abs: 2.1 10*3/uL (ref 0.7–4.0)
MCH: 22.1 pg — ABNORMAL LOW (ref 26.0–34.0)
MCHC: 30.4 g/dL (ref 30.0–36.0)
MCV: 72.6 fL — ABNORMAL LOW (ref 80.0–100.0)
MONOS PCT: 10 %
Monocytes Absolute: 0.7 10*3/uL (ref 0.1–1.0)
NEUTROS PCT: 56 %
Neutro Abs: 3.7 10*3/uL (ref 1.7–7.7)
Platelets: 166 10*3/uL (ref 150–400)
RBC: 4.67 MIL/uL (ref 3.87–5.11)
RDW: 17.8 % — ABNORMAL HIGH (ref 11.5–15.5)
WBC: 6.7 10*3/uL (ref 4.0–10.5)
nRBC: 0 % (ref 0.0–0.2)

## 2018-01-08 MED ORDER — ACETAMINOPHEN 325 MG PO TABS
650.0000 mg | ORAL_TABLET | Freq: Once | ORAL | Status: AC
  Administered 2018-01-08: 650 mg via ORAL
  Filled 2018-01-08: qty 2

## 2018-01-08 NOTE — ED Triage Notes (Signed)
Pt BIBA from home complaining of left leg pain x 2 weeks. Pt states her home therapist came to see her yesterday and noticed that her left leg was painful to touch and internally rotated. Pt states pain is 10/10 with movement. Pt is bed bound at home and has a foley catheter that was last placed 12/04/17. Pt is alert and oriented. Skin is warm and dry. Respirations are regular even and unlabored.

## 2018-01-08 NOTE — ED Provider Notes (Addendum)
[facility] DEPT Provider Note   CSN: [id number] Arrival date & time: 01/08/18  1414     History   Chief Complaint Chief Complaint  Patient presents with  . Leg Pain  . Leg Injury    HPI Lauren Crosby is a 84 y.o. female presenting for evaluation of L leg pain.   Level V caveat, pt appears confused ?some degree of dementia. Additionally, she is hard of hearing.   Pt states she is having left leg pain.  She is not able to tell me when this started or describe the pain.  Patient does state that her home nurse came to see her today, and when she pressed on her leg she had a lot of pain.  No pain currently.  Patient is bed not bound at baseline, denies falls or injuries.  She is not on blood thinners.  Additional history obtained from ambulance and RN note, which states that family was concerned about pain and possible internal rotation of the left leg. Pt recently admitted x2 in the past 2 months. Pt's family was informed that pt has a poor prognosis, and they should consider need for hospice/palliate care. At that time, documented that pt was pleasantly confused.    HPI  Past Medical History:  Diagnosis Date  . Adrenal adenoma   . Anemia   . Arthritis    OA AND PAIN BOTH KNEES BUT RIGHT KNEE PAIN WORSE; SOME LOWER BACK PAIN  . Cellulitis and abscess of trunk   . Colon polyp    adenomatous  . Diabetes ([place])    type 2  PT IS ON ORAL MEDICATION AND INSULIN  . Diverticulitis   . GERD (gastroesophageal reflux disease)    NO MEDS FOR GERD  . Goiter   . Gout   . Hepatitis    YELLOW JAUNDICE WHEN A CHILD  . Hernia   . Hyperlipemia   . Hypertension   . Nephrolithiasis   . 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 Obesity   . Open wound of abdominal wall, anterior, without mention of complication   . Pneumonia    IN THE PAST  . Retinopathy due to secondary diabetes mellitus ([place])   . Shortness of breath    PT IS NOT ABLE  TO BE ACTIVE BECAUSE OF KNEE PROBLEM; HAS ALWAYS HAD PROBLEM WITH BREATHING HEAVY - DOES GET SOB WITH EXERTION.  Marland Kitchen Thalassemia minor     Patient Active Problem List   Diagnosis Date Noted  . Hypoxia   . Pressure injury of skin 11/30/2017  . Type II diabetes mellitus with renal manifestations ([place]) 11/27/2017  . Sepsis ([place]) 11/23/2017  . C. difficile diarrhea 11/23/2017  . Acute respiratory failure with hypoxemia ([place])   . Acute pulmonary edema (HCC)   . Acute metabolic encephalopathy   . Palliative care by specialist   . Terminal care   . Hyperglycemia due to type 2 diabetes mellitus ([place]) 11/04/2017  . Encephalopathy acute 11/04/2017  . Shock circulatory ([place]) 11/03/2017  . Colitis, acute 10/31/2017  . ATN (acute tubular necrosis) ([place]) 10/31/2017  . Diarrhea 10/30/2017  . CKD (chronic kidney disease), stage IV ([place]) 10/30/2017  . Pyelitis: Mild per CT 05/24/2017 05/25/2017  . Acidemia   . Abdominal pain 05/24/2017  . Vaginal candidiasis 05/24/2017  . Dermatitis associated with moisture 05/24/2017  . Ileus [facility]): Colonic 05/24/2017  . Acute lower UTI 05/23/2017  . Iron deficiency anemia 05/23/2017  . Pressure  injury of buttock, stage 2 ([place]) 05/23/2017  . Acute on chronic anemia   . Hyperkalemia   . Hydronephrosis   . Acute kidney failure ([place]) 05/22/2017  . Metabolic acidosis, normal anion gap (NAG) 05/22/2017  . Hyperkalemia, diminished renal excretion 05/22/2017  . Rhinovirus infection   . AKI (acute kidney injury) ([place])   . HCAP (healthcare-associated pneumonia) 01/12/2017  . ARF (acute renal failure) ([place]) 01/12/2017  . Shortness of breath   . Retinopathy due to secondary diabetes mellitus ([place])   . Pneumonia   . Obesity   . 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 ([place])   . Nephrolithiasis   . Hypertension   . Hyperlipemia   . Hepatitis   . Gout   . Goiter   . GERD (gastroesophageal reflux disease)   . Diverticulitis   . Diabetes ([place])   . Colon  polyp   . Cellulitis and abscess of trunk   . Arthritis   . Chronic anemia   . Adrenal adenoma   . Morbid obesity ([place]) 08/13/2013  . Postoperative anemia due to acute blood loss 08/13/2013  . S/P right TKA 08/11/2013  . Preop cardiovascular exam 08/14/2012  . Chest pain 08/14/2012  . Suture granuloma 02/09/2012  . DYSPNEA ON EXERTION 10/04/2009  . Hyperlipidemia associated with type 2 diabetes mellitus ([place]) 04/15/2007  . OBESITY 04/15/2007  . ANEMIA, CHRONIC 04/15/2007  . ANXIETY 04/15/2007  . Depression 04/15/2007  . HTN (hypertension) 04/15/2007  . GERD 04/15/2007  . DEGENERATIVE JOINT DISEASE 04/15/2007  . HELICOBACTER PYLORI INFECTION, HX OF 04/15/2007  . COLONIC POLYPS 10/04/2006  . DIVERTICULOSIS, COLON 10/04/2006  . REFLUX ESOPHAGITIS July 13, 202003  . GASTRITIS, ACUTE July 13, 202003    Past Surgical History:  Procedure Laterality Date  . APPENDECTOMY     1993 PT HAD APPENDECTOMY AND ABDOMINAL SURGERY FRO DIVERTICULITS  . CARDIAC CATHETERIZATION  2008   conclusion: smooth and normal coronary arteries, normal left ventricular systolic function  . CHOLECYSTECTOMY    . COLONOSCOPY  10/04/06  . CYSTOSCOPY WITH BIOPSY N/A 06/16/2016   Procedure: CYSTOSCOPY WITH  BLADDER BIOPSY AND FULGERATION 1CM;  Surgeon: Festus Aloe, MD;  Location: [facility] ORS;  Service: Urology;  Laterality: N/A;  . DIAGNOSTIC MAMMOGRAM  03/17/2011  . IR FLUORO GUIDE CV LINE RIGHT  11/03/2017  . IR US GUIDE VASC ACCESS RIGHT  11/03/2017  . ROTATOR CUFF REPAIR Left   . SIGMOID RESECTION / RECTOPEXY    . TONSILLECTOMY    . TOTAL ABDOMINAL HYSTERECTOMY    . TOTAL KNEE ARTHROPLASTY Right 08/11/2013   Procedure: RIGHT TOTAL KNEE ARTHROPLASTY;  Surgeon: Mauri Pole, MD;  Location: [facility] ORS;  Service: Orthopedics;  Laterality: Right;  . VENTRAL HERNIA REPAIR  2007     OB History    Gravida  7   Para  6   Term  6   Preterm      AB  1   Living  6     SAB  1   TAB      Ectopic      Multiple        Live Births  6            Home Medications    Prior to Admission medications   Medication Sig Start Date End Date Taking? Authorizing Provider  Amino Acids-Protein Hydrolys (FEEDING SUPPLEMENT, PRO-STAT SUGAR FREE 64,) LIQD Take 30 mLs by mouth 2 (two) times daily between meals. 11/16/17   Elgergawy, Silver Huguenin, MD  blood  glucose meter kit and supplies KIT Dispense based on patient and insurance preference. Use up to four times daily as directed. (FOR ICD-9 250.00, 250.01). For QAC - HS accuchecks. 12/06/17   Thurnell Lose, MD  ferrous sulfate 325 (65 FE) MG tablet Take 1 tablet (325 mg total) by mouth 2 (two) times daily with a meal. 12/06/17   Thurnell Lose, MD  furosemide (LASIX) 40 MG tablet Take 1 tablet (40 mg total) by mouth 2 (two) times daily. 12/06/17   Thurnell Lose, MD  guaiFENesin-dextromethorphan (ROBITUSSIN DM) 100-10 MG/5ML syrup Take 5 mLs by mouth every 6 (six) hours as needed for cough. 12/06/17   Thurnell Lose, MD  insulin aspart (NOVOLOG) 100 UNIT/ML injection Inject 1-9 Units into the skin See admin instructions. Can switch to any insulin.  Inject 1-9 units subcutaneously four times daily - before meals and at bedtime - per sliding scale: CBG 121-150 1 unit, 151-200 2 units, 201-250 3 units, 251-300 5 units, 301-350 7 units, 351-400 9 units, >400 9 units and call MD 12/06/17   Thurnell Lose, MD  insulin glargine (LANTUS) 100 UNIT/ML injection Inject 0.1 mLs (10 Units total) into the skin at bedtime. Dispense pen if approved. 12/06/17   Thurnell Lose, MD  Insulin Syringe-Needle U-100 25G X 1" 1 ML MISC For 4 times a day insulin SQ, 1 month supply. Diagnosis E11.65 12/06/17   Thurnell Lose, MD  ipratropium-albuterol (DUONEB) 0.5-2.5 (3) MG/3ML SOLN Take 3 mLs by nebulization every 4 (four) hours as needed. Patient taking differently: Take 3 mLs by nebulization every 4 (four) hours as needed (shortness of breath/wheezing).  11/16/17   Elgergawy,  Silver Huguenin, MD  levETIRAcetam (KEPPRA) 1000 MG tablet Take 1 tablet (1,000 mg total) by mouth 2 (two) times daily. 12/06/17   Thurnell Lose, MD  metoprolol tartrate (LOPRESSOR) 25 MG tablet Take 0.5 tablets (12.5 mg total) by mouth 2 (two) times daily. 12/06/17   Thurnell Lose, MD  Multiple Vitamin (MULTIVITAMIN WITH MINERALS) TABS tablet Take 1 tablet by mouth daily.    [provider]  Multiple Vitamins-Minerals (PRESERVISION AREDS 2) CAPS Take 1 capsule by mouth daily.    [provider]  OXYGEN Inhale 2 L into the lungs continuous.    [provider]  QUEtiapine (SEROQUEL) 50 MG tablet Take 1 tablet (50 mg total) by mouth at bedtime. 12/06/17   Thurnell Lose, MD  ranitidine (ZANTAC) 150 MG/10ML syrup Take 5 mLs (75 mg total) by mouth 2 (two) times daily. 12/06/17   Thurnell Lose, MD  simvastatin (ZOCOR) 80 MG tablet Take 1 tablet (80 mg total) by mouth at bedtime. 12/06/17   Thurnell Lose, MD  sucralfate (CARAFATE) 1 g tablet Take 1 tablet (1 g total) by mouth 4 (four) times daily -  with meals and at bedtime. 12/06/17   Thurnell Lose, MD  tamsulosin (FLOMAX) 0.4 MG CAPS capsule Take 1 capsule (0.4 mg total) by mouth daily. 12/07/17   Thurnell Lose, MD    Family History Family History  Problem Relation Age of Onset  . Diabetes Father   . Hypertension Father   . Kidney disease Father   . Liver disease Mother   . Hypertension Unknown   . Rheum arthritis Sister   . Diverticulitis Sister   . Hypertension Sister   . Colon cancer Neg Hx   . Colon polyps Neg Hx   . Esophageal cancer Neg Hx   .  Pancreatic cancer Neg Hx   . Stomach cancer Neg Hx     Social History Social History   Tobacco Use  . Smoking status: Never Smoker  . Smokeless tobacco: Never Used  Substance Use Topics  . Alcohol use: No    Alcohol/week: 0.0 standard drinks  . Drug use: No     Allergies   Ativan [lorazepam]; Benadryl [diphenhydramine]; and  Penicillins   Review of Systems Review of Systems  Unable to perform ROS: Mental status change  All other systems reviewed and are negative.    Physical Exam Updated Vital Signs BP (!) 131/58   Pulse 70   Resp 16   SpO2 97%   Physical Exam Vitals signs and nursing note reviewed.  Constitutional:      General: She is not in acute distress.    Appearance: She is well-developed.     Comments: Elderly female in the bed in NAD  HENT:     Head: Normocephalic and atraumatic.     Mouth/Throat:     Mouth: Mucous membranes are dry.  Eyes:     Conjunctiva/sclera: Conjunctivae normal.     Pupils: Pupils are equal, round, and reactive to light.  Neck:     Musculoskeletal: Normal range of motion and neck supple.  Cardiovascular:     Rate and Rhythm: Normal rate and regular rhythm.     Pulses: Normal pulses.  Pulmonary:     Effort: Pulmonary effort is normal. No respiratory distress.     Breath sounds: Normal breath sounds. No wheezing.  Abdominal:     General: There is no distension.     Palpations: Abdomen is soft. There is no mass.     Tenderness: There is no abdominal tenderness. There is no guarding or rebound.  Musculoskeletal:        General: Tenderness present.     Comments: Pt is leaning on her R hip, so both legs are rotated towards the R. No shortening of the L leg. Tenderness to palpation of both legs without acute deformity, contusions, signs of injury, erythema, or warmth. No lesions or obvious infections  Skin:    General: Skin is warm and dry.     Capillary Refill: Capillary refill takes less than 2 seconds.  Neurological:     Mental Status: She is alert and oriented to person, place, and time.      ED Treatments / Results  Labs (all labs ordered are listed, but only abnormal results are displayed) Labs Reviewed  CBC WITH DIFFERENTIAL/PLATELET - Abnormal; Notable for the following components:      Result Value   Hemoglobin 10.3 (*)    HCT 33.9 (*)    MCV  72.6 (*)    MCH 22.1 (*)    RDW 17.8 (*)    All other components within normal limits  BASIC METABOLIC PANEL - Abnormal; Notable for the following components:   Chloride 96 (*)    Glucose, Bld 162 (*)    BUN 54 (*)    Creatinine, Ser 1.27 (*)    GFR calc non Af Amer 39 (*)    GFR calc Af Amer 45 (*)    All other components within normal limits  URINALYSIS, ROUTINE W REFLEX MICROSCOPIC    EKG None  Radiology Dg Ankle Complete Left  Result Date: 01/08/2018 CLINICAL DATA:  Acute left leg pain. EXAM: LEFT ANKLE COMPLETE - 3+ VIEW COMPARISON:  None. FINDINGS: There is no evidence of fracture, dislocation, or joint effusion.  There is no evidence of arthropathy or other focal bone abnormality. Soft tissues are unremarkable. IMPRESSION: Negative. Electronically Signed   By: Marijo Conception, M.D.   On: 01/08/2018 15:26   Dg Knee Complete 4 Views Left  Result Date: 01/08/2018 CLINICAL DATA:  Acute left leg pain. EXAM: LEFT KNEE - COMPLETE 4+ VIEW COMPARISON:  None. FINDINGS: No evidence of fracture, dislocation, or joint effusion. Vascular calcifications are noted. Moderate narrowing of all 3 joint spaces is noted. IMPRESSION: Moderate tricompartmental degenerative joint disease. No acute abnormality seen. Electronically Signed   By: Marijo Conception, M.D.   On: 01/08/2018 15:24   Dg Hip Unilat W Or Wo Pelvis 2-3 Views Left  Result Date: 01/08/2018 CLINICAL DATA:  Acute left leg pain. EXAM: DG HIP (WITH OR WITHOUT PELVIS) 2-3V LEFT COMPARISON:  None. FINDINGS: There is no evidence of hip fracture or dislocation. There is no evidence of arthropathy or other focal bone abnormality. IMPRESSION: Negative. Electronically Signed   By: Marijo Conception, M.D.   On: 01/08/2018 15:19   Vas [place] Lower Extremity Venous (dvt) (mc And Wl 7a-7p)  Result Date: 01/08/2018  Lower Venous Study Indications: Pain.  Performing Technologist: Toma Copier RVS  Examination Guidelines: A complete evaluation  includes B-mode imaging, spectral Doppler, color Doppler, and power Doppler as needed of all accessible portions of each vessel. Bilateral testing is considered an integral part of a complete examination. Limited examinations for reoccurring indications may be performed as noted.  Right Venous Findings: +---+---------------+---------+-----------+----------+-------+    CompressibilityPhasicitySpontaneityPropertiesSummary +---+---------------+---------+-----------+----------+-------+ CFVFull           Yes      Yes                          +---+---------------+---------+-----------+----------+-------+ SFJFull                                                 +---+---------------+---------+-----------+----------+-------+  Left Venous Findings: +---------+---------------+---------+-----------+----------+-------------------+          CompressibilityPhasicitySpontaneityPropertiesSummary             +---------+---------------+---------+-----------+----------+-------------------+ CFV      Full                                                             +---------+---------------+---------+-----------+----------+-------------------+ SFJ      Full                                                             +---------+---------------+---------+-----------+----------+-------------------+ FV Prox  Full                                                             +---------+---------------+---------+-----------+----------+-------------------+ FV Mid   Full                                                             +---------+---------------+---------+-----------+----------+-------------------+  FV DistalFull                                                             +---------+---------------+---------+-----------+----------+-------------------+ PFV      Full                                                              +---------+---------------+---------+-----------+----------+-------------------+ POP      Full                                                             +---------+---------------+---------+-----------+----------+-------------------+ PTV                                                   Limited visibility                                                        due to position of                                                        the leg             +---------+---------------+---------+-----------+----------+-------------------+ PERO                                                  Not visualized due                                                        to rotation of the                                                        leg                 +---------+---------------+---------+-----------+----------+-------------------+  Left Technical Findings: Unable to visualize the peroneal and portions of the posterior tibial veins due to severe pain on compression and rotation of the lower leg   Summary: Right: There  is no evidence of a common femoral vein obstruction Left: There is no obvious evidence of deep vein thrombosis in the areas of the vein able to be visualized in the lower extremity. See technical finding listed above. No cystic structure found in the popliteal fossa.  *See table(s) above for measurements and observations. Electronically signed by Servando Snare MD on 01/08/2018 at 5:12:17 PM.    Final     Procedures Procedures (including critical care time)  Medications Ordered in ED Medications  acetaminophen (TYLENOL) tablet 650 mg (650 mg Oral Given 01/08/18 1551)     Initial Impression / Assessment and Plan / ED Course  I have reviewed the triage vital signs and the nursing notes.  Pertinent labs & imaging results that were available during my care of the patient were reviewed by me and considered in my medical decision making (see chart  for details).     Patient presenting for evaluation of left leg pain.  Physical exam shows elderly patient who appears in no distress.  No obvious deformity.  Both legs are turned towards the right side, as patient is leaning towards her right hip, but no obvious shortening.  Pedal pulses intact.  Patient with tenderness of the entire left leg.  No obvious swelling.  No skin changes to suggest cellulitis or infection. However, as there is concern about fracture, will obtain x-rays of the leg.  Will obtain ultrasound due to worsening leg pain in a bedbound patient.  X-rays read interpreted by me, no fracture dislocation.  Ultrasound negative for DVT.  Case discussed with attending, Dr. Tyrone Nine evaluated the patient.  Feels symptoms may be muscular or sciatica.  Consider possible need for back x-ray, although as patient is bedbound and without a fall, low suspicion for fracture.  At this time, patient appears safe for discharge.  Return precautions given.  Patient and family state they understand and agree to plan.   Final Clinical Impressions(s) / ED Diagnoses   Final diagnoses:  Left leg pain    ED Discharge Orders    None       Franchot Heidelberg, PA-C 01/08/18 2001    Deno Etienne, DO 01/08/18 2329    Franchot Heidelberg, PA-C 01/28/18 [place], DO 01/28/18 1322

## 2018-01-08 NOTE — ED Notes (Signed)
Pt transported to Xray via stretcher bed at this time.

## 2018-01-08 NOTE — Progress Notes (Signed)
Left lower extremity venous duplex completed - Technically limited due to lower leg anatomy and pain. Unable to fully compress the PTV due to pain. Unable to visualize the peroneal vein. There is no obvious evidence of DVT in the veins able to be imaged. Exam somewhat inconclusive. [facility], [place] 01/08/2018 4:35 PM

## 2018-01-08 NOTE — ED Notes (Signed)
[facility] called and setup for pt at this time.

## 2018-01-08 NOTE — Discharge Instructions (Signed)
Your x-ray was negative today. Your ultrasound was negative for blood clot. This is likely sciatica or muscular pain. Use Tylenol as needed for pain. Continue taking home medications as prescribed. Up with your PCP as needed for further evaluation of your symptoms. Return to the emergency room if any new, worsening, concerning symptoms.

## 2018-01-26 DIAGNOSIS — M1990 Unspecified osteoarthritis, unspecified site: Secondary | ICD-10-CM | POA: Diagnosis not present

## 2018-01-26 DIAGNOSIS — F329 Major depressive disorder, single episode, unspecified: Secondary | ICD-10-CM | POA: Diagnosis not present

## 2018-01-26 DIAGNOSIS — I10 Essential (primary) hypertension: Secondary | ICD-10-CM | POA: Diagnosis not present

## 2018-01-26 DIAGNOSIS — G40909 Epilepsy, unspecified, not intractable, without status epilepticus: Secondary | ICD-10-CM | POA: Diagnosis not present

## 2018-01-26 DIAGNOSIS — R627 Adult failure to thrive: Secondary | ICD-10-CM | POA: Diagnosis not present

## 2018-01-26 DIAGNOSIS — E1165 Type 2 diabetes mellitus with hyperglycemia: Secondary | ICD-10-CM | POA: Diagnosis not present

## 2018-02-09 DEATH — deceased

## 2019-07-24 IMAGING — MR MR HEAD W/O CM
10 of 12 series · 33 of 48 positions shown · non-contrast
Comparison: Prior CT from 11/27/2017.

CLINICAL DATA: Initial evaluation for acute altered mental status,
seizure, unclear cause.

EXAM:
MRI HEAD WITHOUT CONTRAST
TECHNIQUE: Multiplanar, multiecho pulse sequences of the brain and surrounding
structures were obtained without intravenous contrast.

[Series 5: DWI · axial · 4.0mm · 0.88mm/px · z∈[-84,+59]mm · 7 of 74 slices shown (1 of 4)]
[im 1/74]
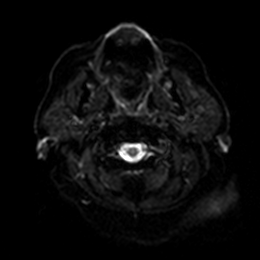
[im 13/74]
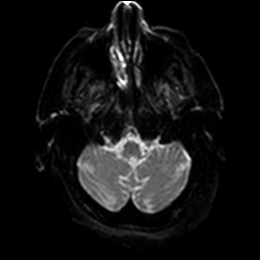
[im 25/74]
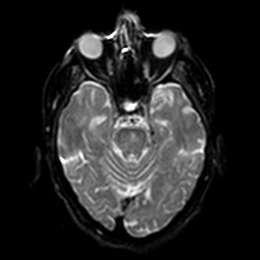
[im 37/74]
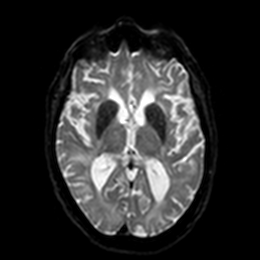
[im 49/74]
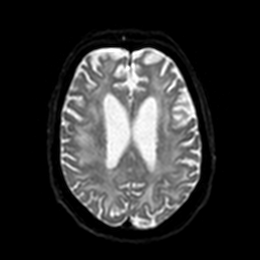
[im 61/74]
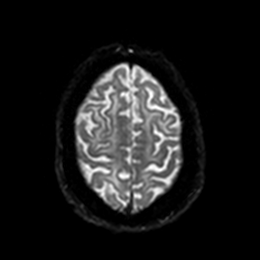
[im 74/74]
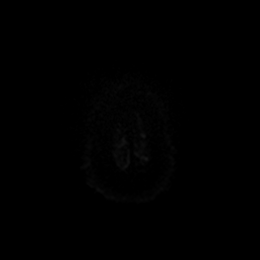

[Series 6: DWI · axial · 4.0mm · 0.88mm/px · z∈[-84,+59]mm · 3 of 37 slices shown (2 of 4)]
[im 1/37]
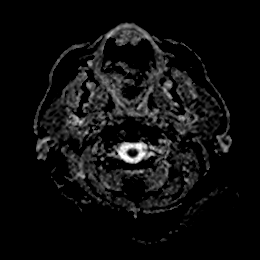
[im 19/37]
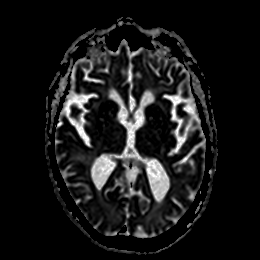
[im 37/37]
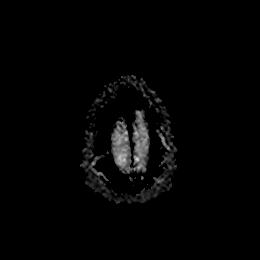

[Series 7: DWI · coronal · 4.0mm · 0.88mm/px · 6 of 64 slices shown (3 of 4)]
[im 1/64]
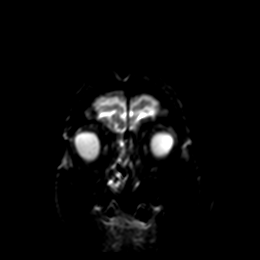
[im 13/64]
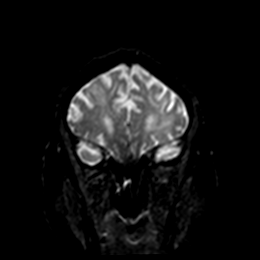
[im 26/64]
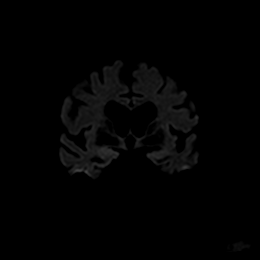
[im 38/64]
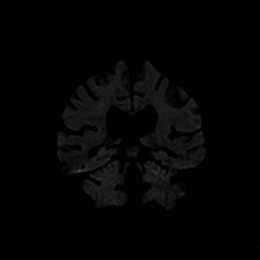
[im 51/64]
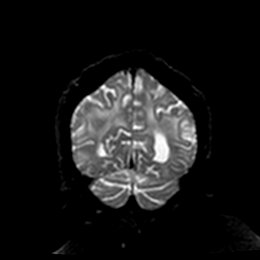
[im 64/64]
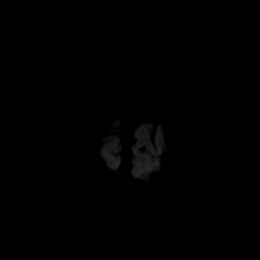

[Series 8: DWI · coronal · 4.0mm · 0.88mm/px · 3 of 32 slices shown (4 of 4)]
[im 1/32]
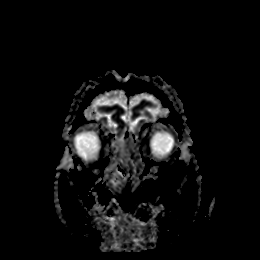
[im 16/32]
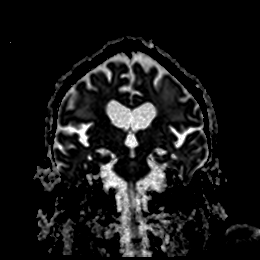
[im 32/32]
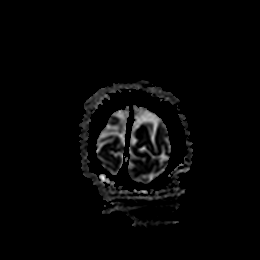

[Series 9: T1 · sagittal · 5.0mm · 0.75mm/px · 2 of 23 slices shown]
[im 1/23]
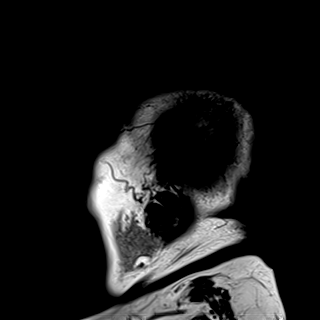
[im 23/23]
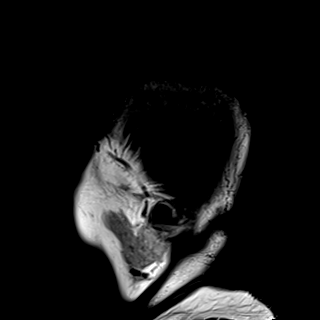

[Series 10: T2 · axial · 5.0mm · 0.72mm/px · z∈[-80,+63]mm · 2 of 25 slices shown (1 of 3)]
[im 1/25]
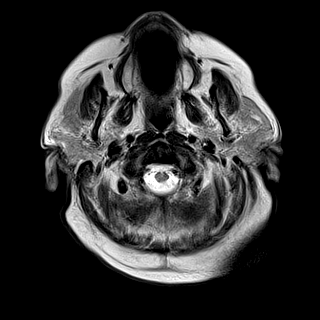
[im 25/25]
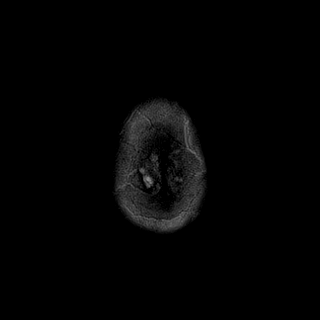

[Series 11: FLAIR · axial · 5.0mm · 0.45mm/px · z∈[-82,+61]mm · 2 of 25 slices shown]
[im 1/25]
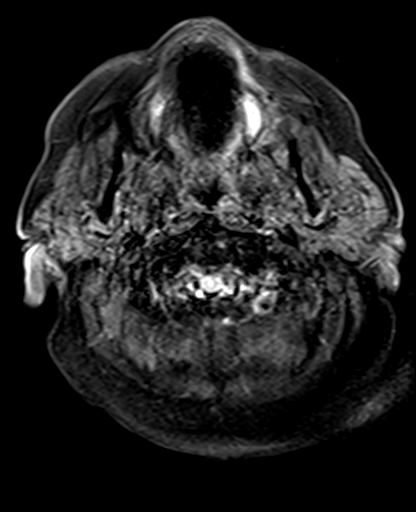
[im 25/25]
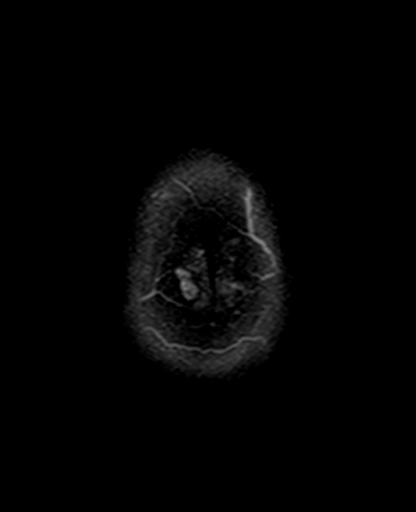

[Series 12: swi_images · axial · 3.0mm · 0.90mm/px · z∈[-96,-63]mm · 2 of 60 slices shown]
[im 1/60]
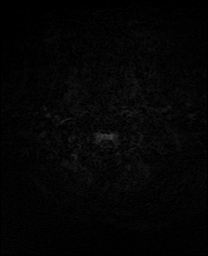
[im 12/60]
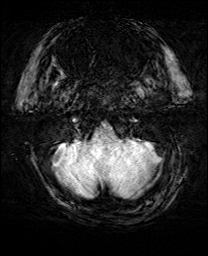

[Series 15: T2 · coronal · 5.0mm · 0.34mm/px · 3 of 29 slices shown (2 of 3)]
[im 1/29]
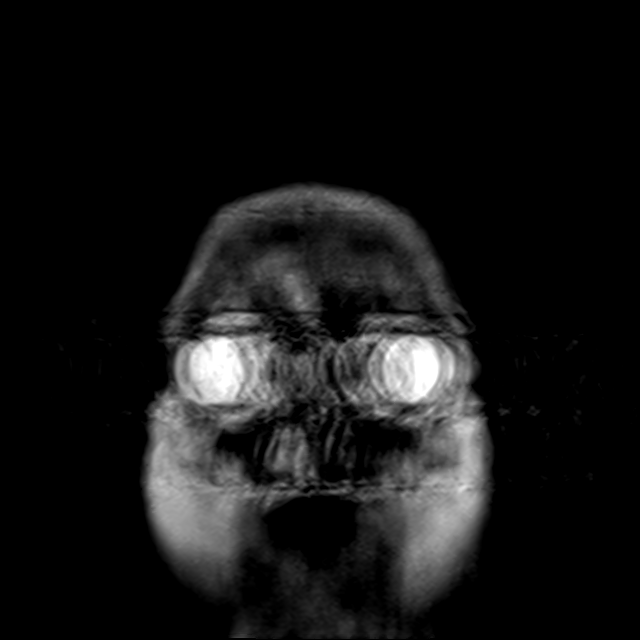
[im 15/29]
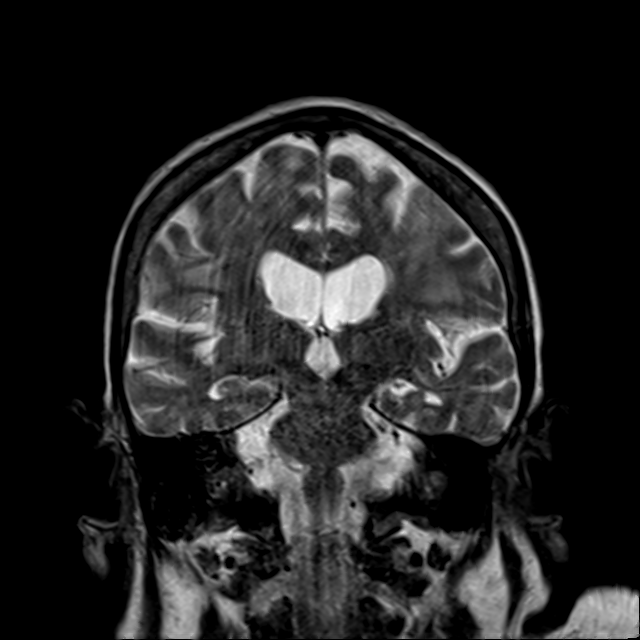
[im 29/29]
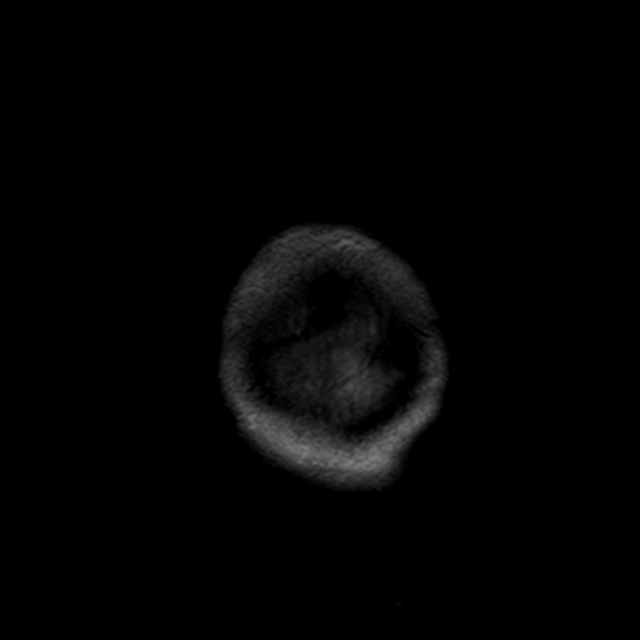

[Series 16: T2 · coronal · 3.0mm · 0.27mm/px · 3 of 32 slices shown (3 of 3)]
[im 1/32]
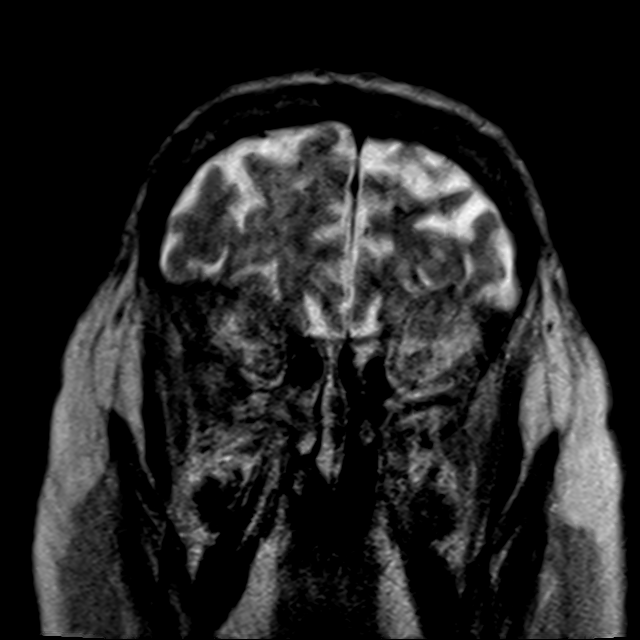
[im 16/32]
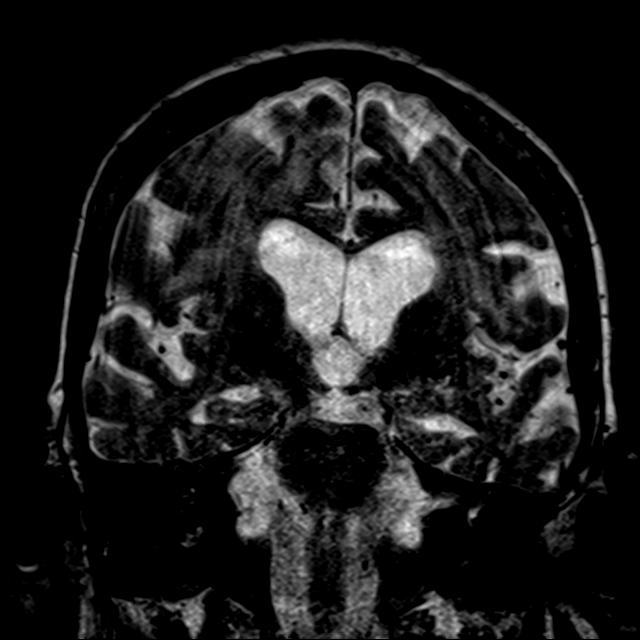
[im 32/32]
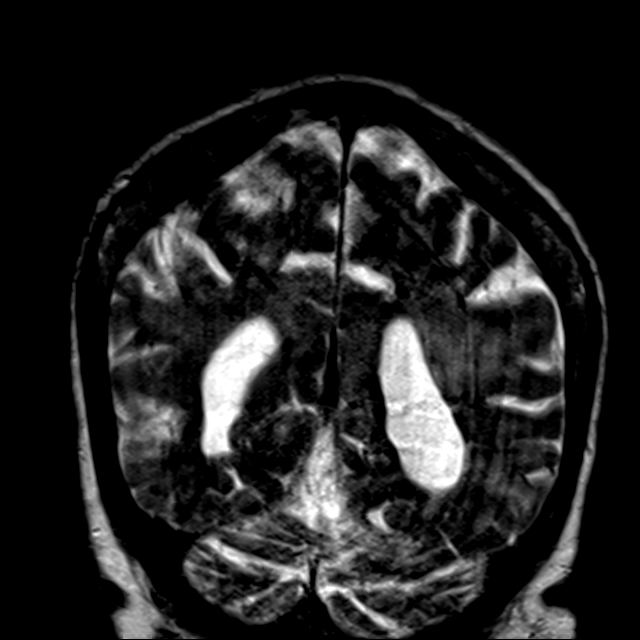

[33 of 48 positions shown; findings below may reference images not displayed]

FINDINGS: Brain: Diffuse prominence of the CSF containing spaces compatible
with generalized age-related cerebral atrophy, relatively stable in
appearance as compared to most recent MRI from 11/29/2016. There has
been interval development of extensive T2/FLAIR signal abnormality
throughout the supratentorial and infratentorial cerebral white
matter, involving both cerebral hemispheres in a fairly symmetric
pattern. Slight posterior predominance involving the
parieto-occipital regions noted. Involvement of both internal and
external capsules. Patchy infratentorial involvement involving the
bilateral cerebellar hemispheres. Suggestion of associated mild
edema as a few cortical gyri appear slightly expanded as compared to
previous exam (series 10, image 20). Sparing of the overlying
cortical gray matter which is relatively normal in appearance.
Findings are nonspecific. Superimposed chronic microvascular
ischemic changes noted within the pons, otherwise similar to
previous.

No abnormal foci of restricted diffusion to suggest acute or
subacute ischemia. No encephalomalacia to suggest chronic cortical
infarction. No foci of susceptibility artifact to suggest acute or
chronic intracranial hemorrhage. No mass lesion, midline shift or
mass effect. Stable ventricular prominence related global
parenchymal volume loss without hydrocephalus. No extra-axial fluid
collection. Pituitary gland normal. Midline structures intact.

Vascular: Major intracranial vascular flow voids are maintained.

Skull and upper cervical spine: Craniocervical junction within
normal limits. No focal marrow replacing lesion. Scalp soft tissues
unremarkable.

Sinuses/Orbits: Patient status post ocular lens replacement
bilaterally. Paranasal sinuses are clear. Small right mastoid
effusion noted.

Other: None.
IMPRESSION: Extensive abnormal T2/FLAIR signal abnormality throughout the
cerebral white matter as above, new relative to most recent MRI from
11/29/2016. Findings are nonspecific, with primary differential
considerations including PRES versus toxic/metabolic derangement.
Anoxic brain injury felt to be unlikely given the relative sparing
of the gray matter structures. Correlation with laboratory values
and history recommended.
